# Patient Record
Sex: Male | Born: 1955 | Race: White | Hispanic: No | Marital: Married | Smoking: Never smoker
Health system: Southern US, Community
[De-identification: ages and names within clinical notes are randomized; demographics above are authoritative.]

## PROBLEM LIST (undated history)

## (undated) DIAGNOSIS — C169 Malignant neoplasm of stomach, unspecified: Secondary | ICD-10-CM

## (undated) DIAGNOSIS — M1990 Unspecified osteoarthritis, unspecified site: Secondary | ICD-10-CM

## (undated) DIAGNOSIS — N289 Disorder of kidney and ureter, unspecified: Secondary | ICD-10-CM

## (undated) DIAGNOSIS — E785 Hyperlipidemia, unspecified: Secondary | ICD-10-CM

## (undated) DIAGNOSIS — E119 Type 2 diabetes mellitus without complications: Secondary | ICD-10-CM

## (undated) DIAGNOSIS — M869 Osteomyelitis, unspecified: Secondary | ICD-10-CM

## (undated) DIAGNOSIS — I10 Essential (primary) hypertension: Secondary | ICD-10-CM

## (undated) DIAGNOSIS — C029 Malignant neoplasm of tongue, unspecified: Secondary | ICD-10-CM

## (undated) DIAGNOSIS — E669 Obesity, unspecified: Secondary | ICD-10-CM

## (undated) DIAGNOSIS — B958 Unspecified staphylococcus as the cause of diseases classified elsewhere: Secondary | ICD-10-CM

## (undated) DIAGNOSIS — Z9889 Other specified postprocedural states: Secondary | ICD-10-CM

## (undated) HISTORY — DX: Essential (primary) hypertension: I10

## (undated) HISTORY — DX: Obesity, unspecified: E66.9

## (undated) HISTORY — DX: Hyperlipidemia, unspecified: E78.5

## (undated) HISTORY — PX: OTHER SURGICAL HISTORY: SHX169

## (undated) HISTORY — DX: Malignant neoplasm of stomach, unspecified: C16.9

## (undated) HISTORY — DX: Type 2 diabetes mellitus without complications: E11.9

---

## 2004-10-15 ENCOUNTER — Emergency Department: Payer: Self-pay | Admitting: Emergency Medicine

## 2004-10-15 IMAGING — CR DG SHOULDER 3+V*L*
1 series · 3 of 3 positions shown · non-contrast
Comparison: none

REASON FOR EXAM: LEFT shoulder pain. Rm 5
COMMENTS:

PROCEDURE:     DXR - DXR SHOULDER LEFT COMPLETE  - [DATE]  [DATE]
RESULT:     Three views of the LEFT shoulder show no fracture, dislocation,
or other acute bony abnormality.

[Series 1: view not recorded · 0.17mm/px · 3 of 3 slices shown]
[im 1/3]
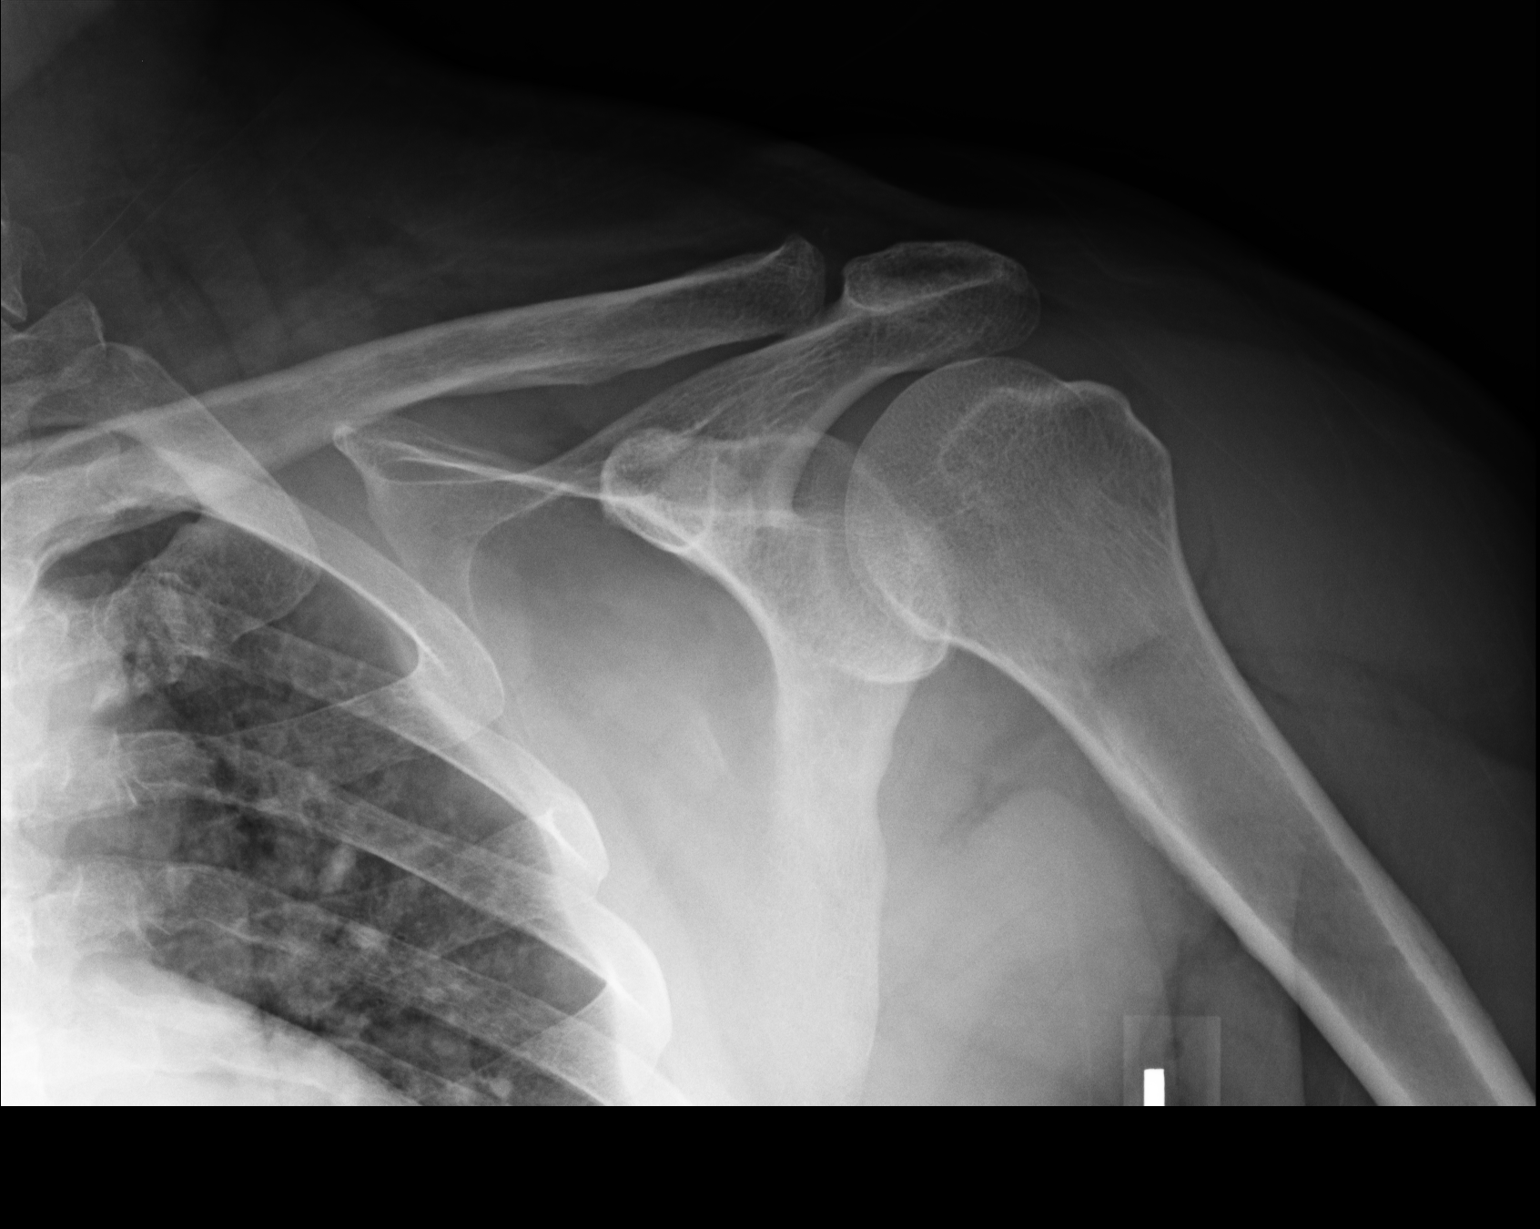
[im 2/3]
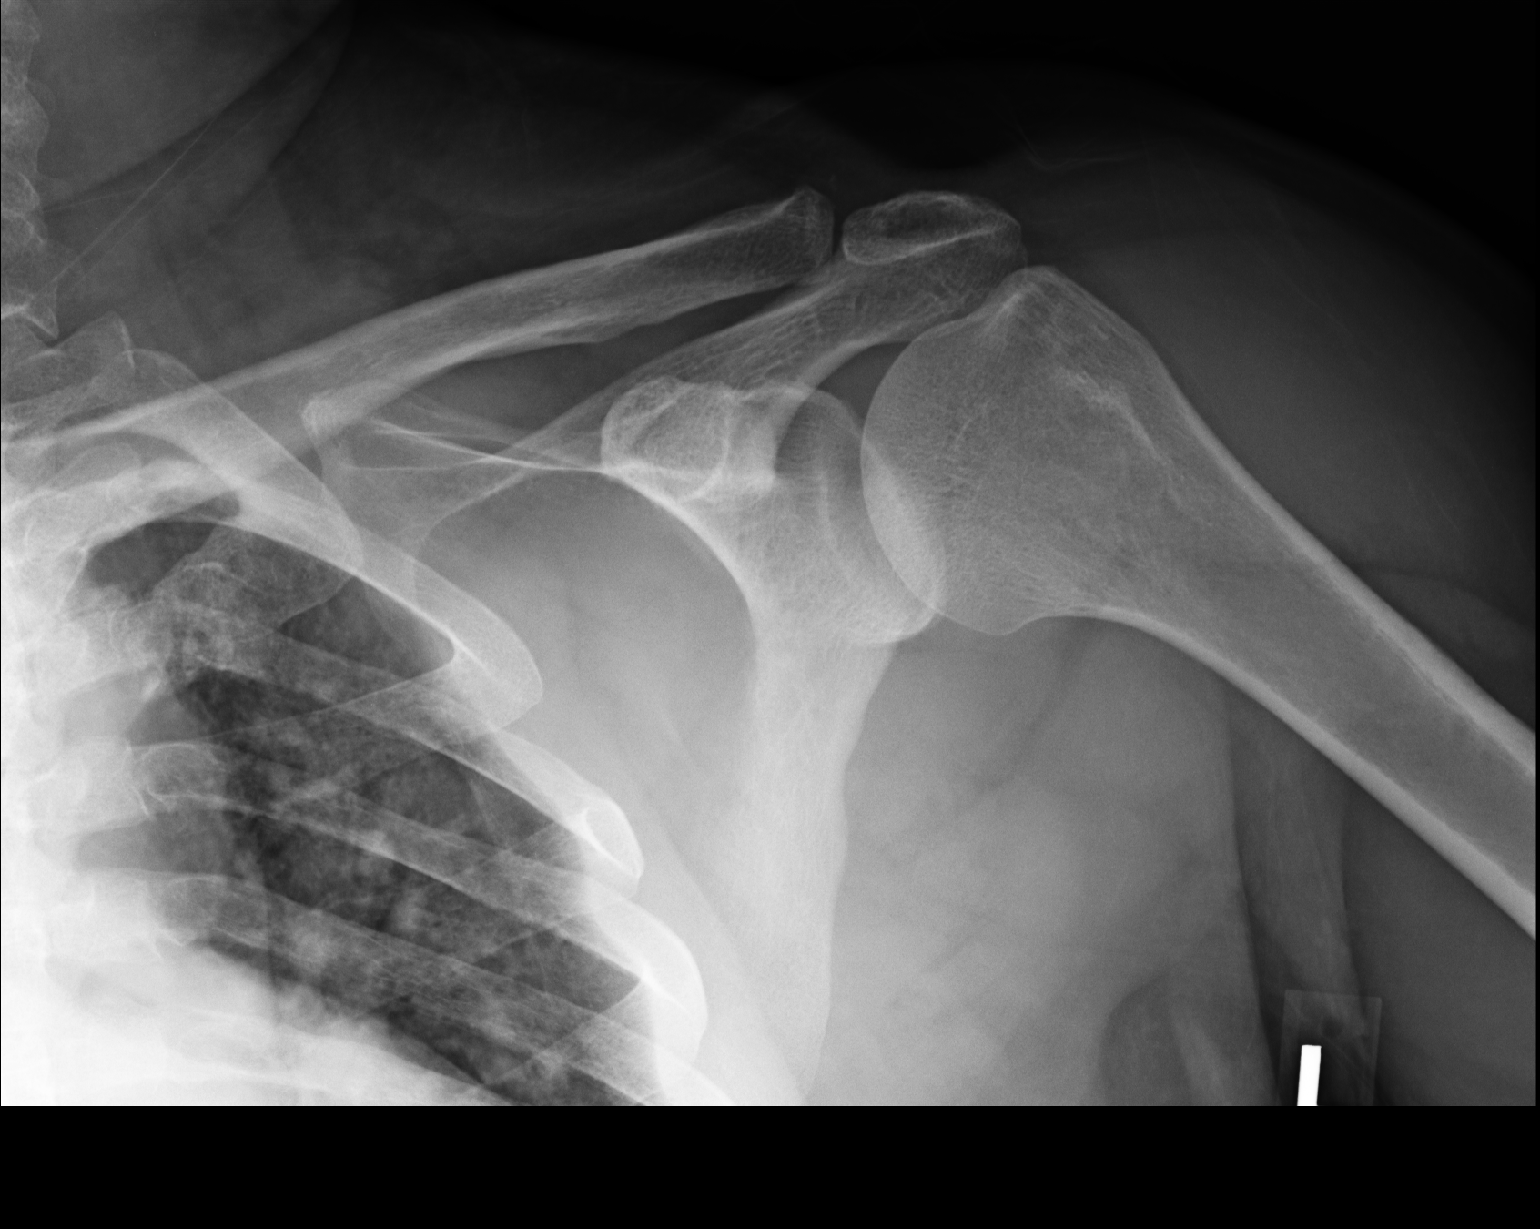
[im 3/3]
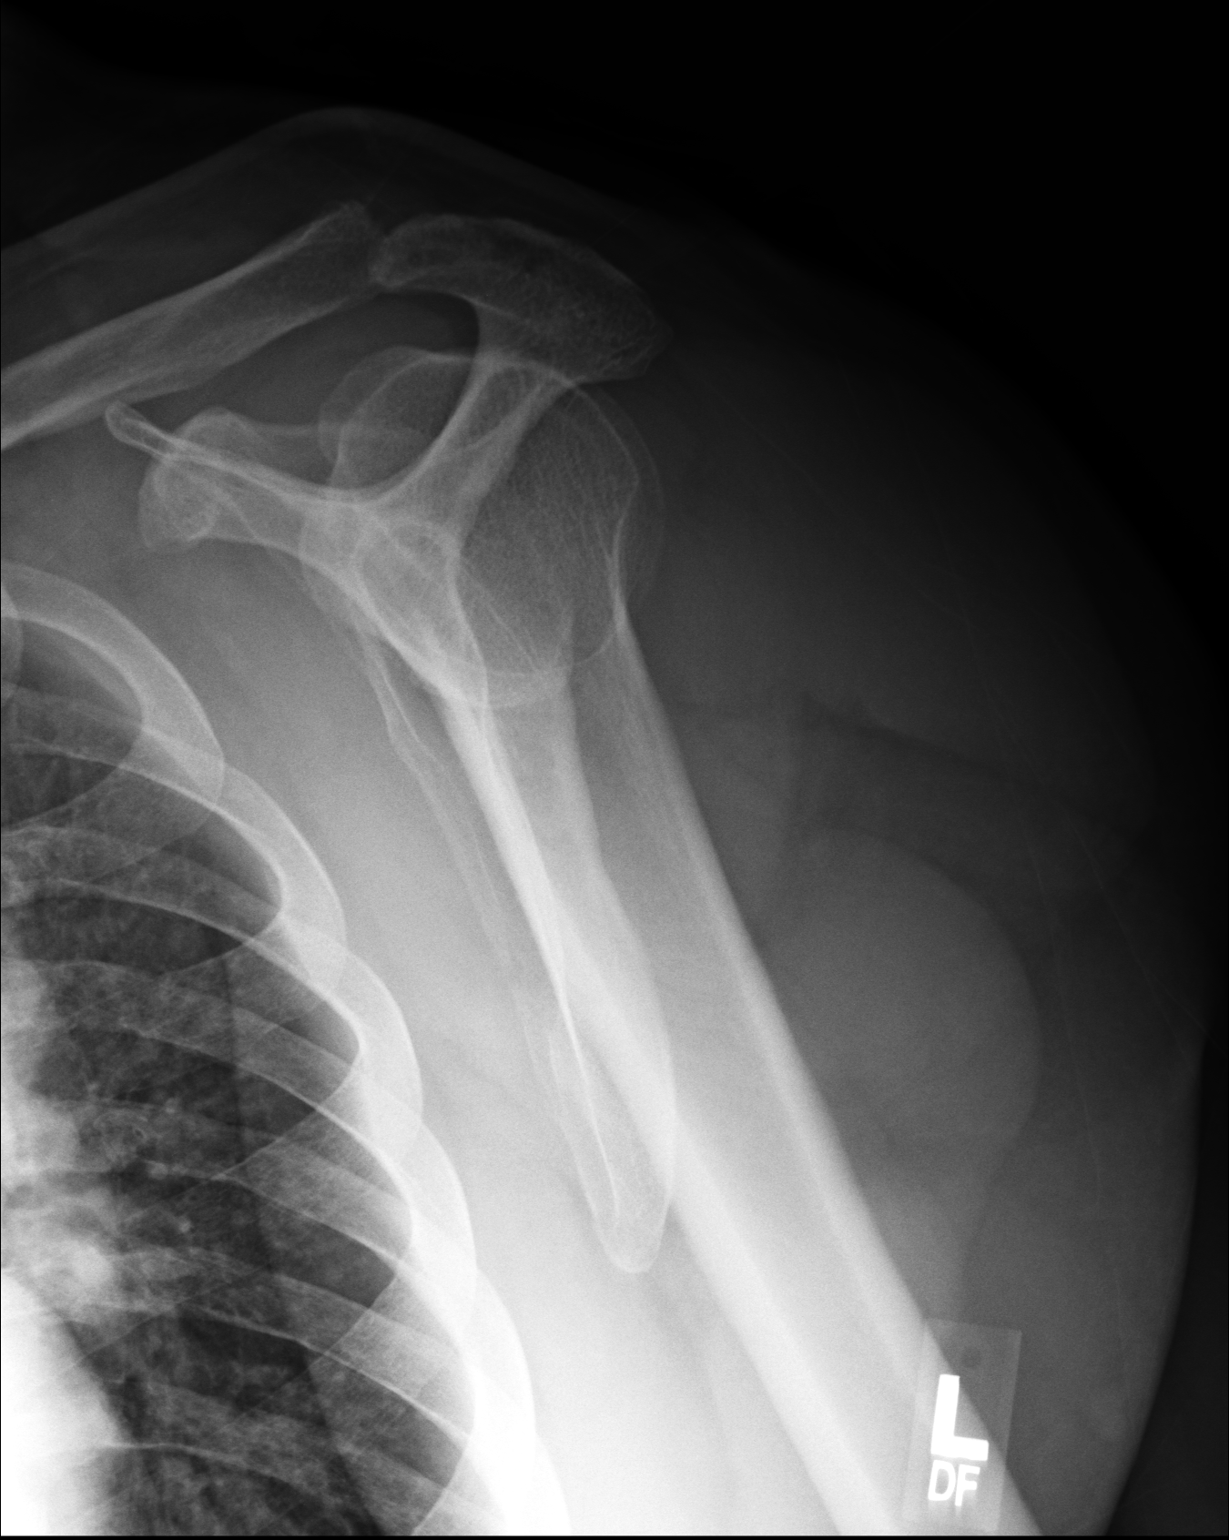

[3 of 3 positions shown; findings below may reference images not displayed]

IMPRESSION: No significant osseous abnormalities are noted.

## 2006-11-04 ENCOUNTER — Emergency Department: Payer: Self-pay | Admitting: Emergency Medicine

## 2006-11-04 IMAGING — US ABDOMEN ULTRASOUND
1 series · 17 of 25 positions shown · non-contrast
Comparison: none

REASON FOR EXAM: ruq pain pt in rm # 5
COMMENTS:

[Series 1: abdomen ultrasound · 17 of 61 slices shown]
[im 1/61]
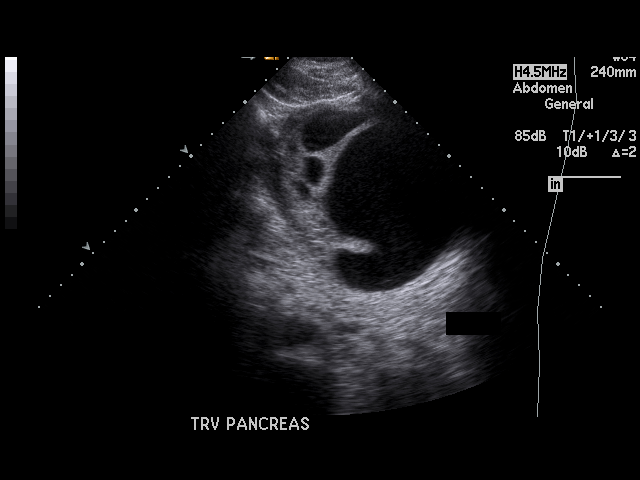
[im 6/61]
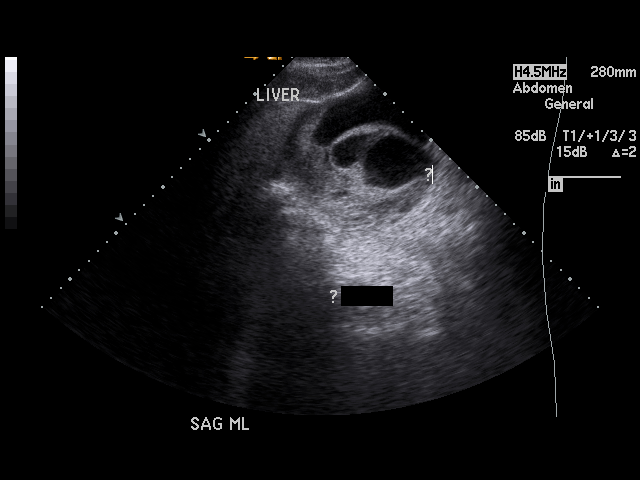
[im 8/61]
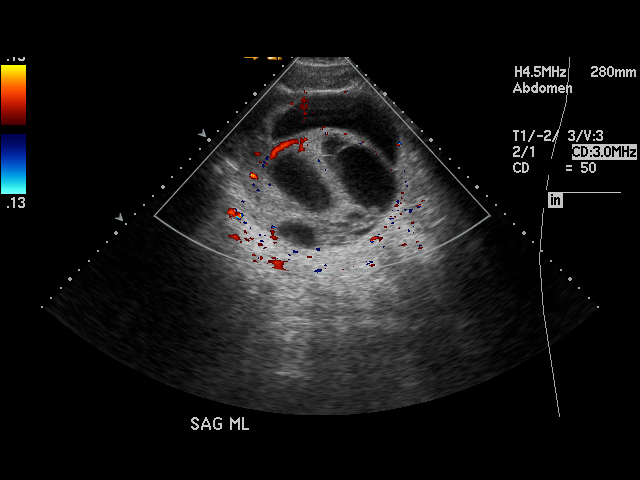
[im 13/61]
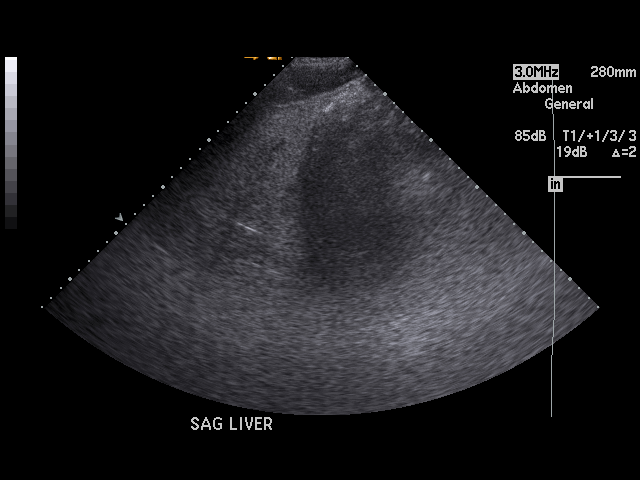
[im 16/61]
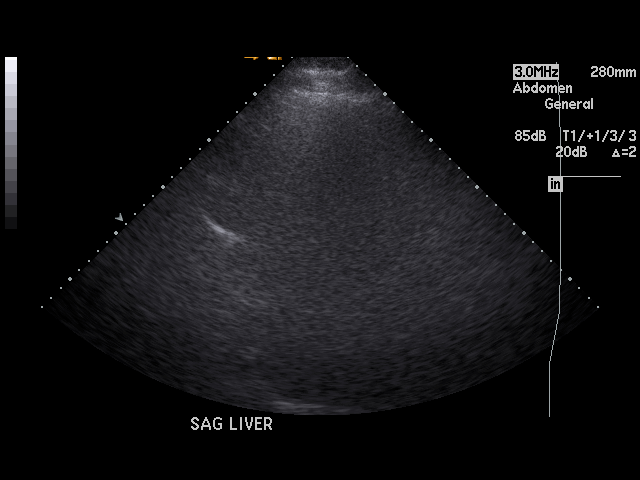
[im 21/61]
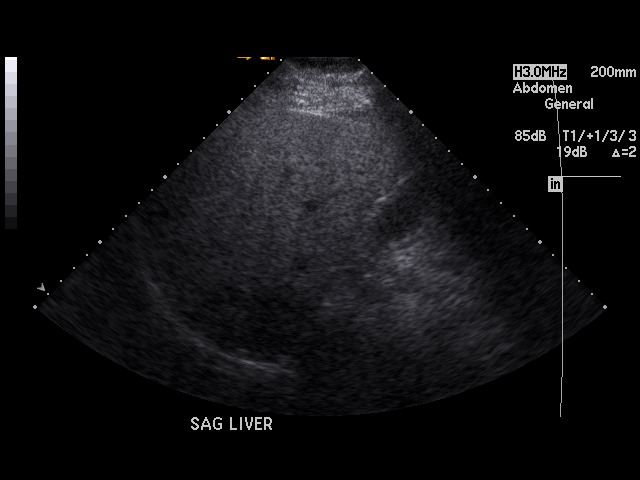
[im 23/61]
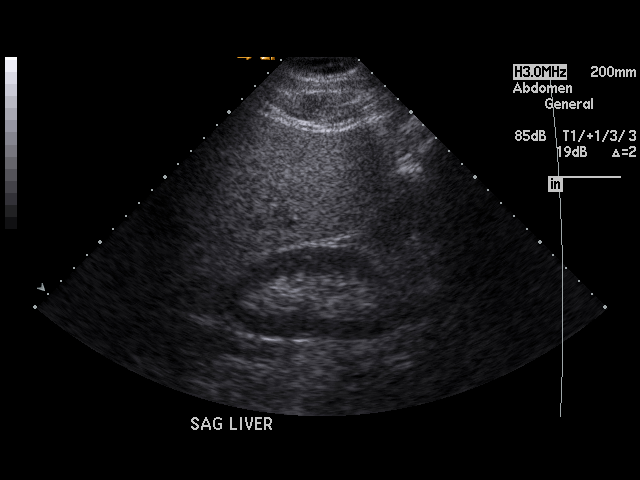
[im 28/61]
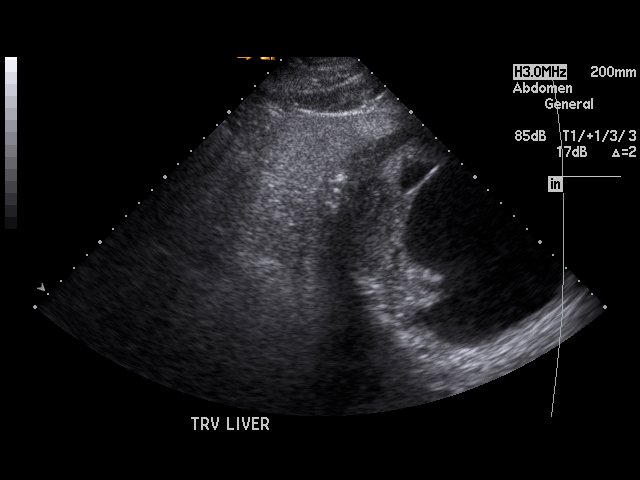
[im 31/61]
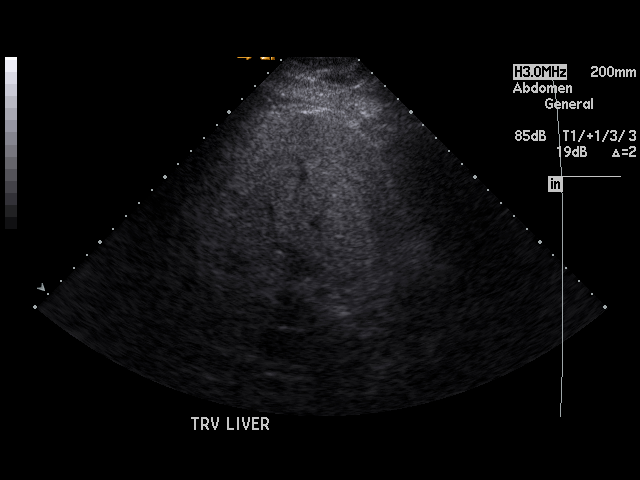
[im 33/61]
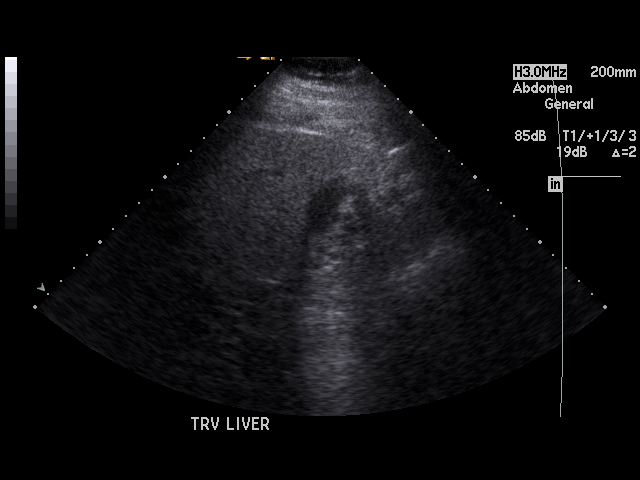
[im 38/61]
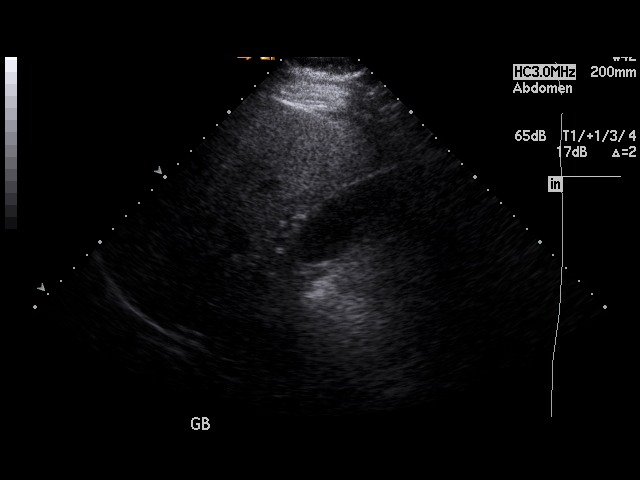
[im 41/61]
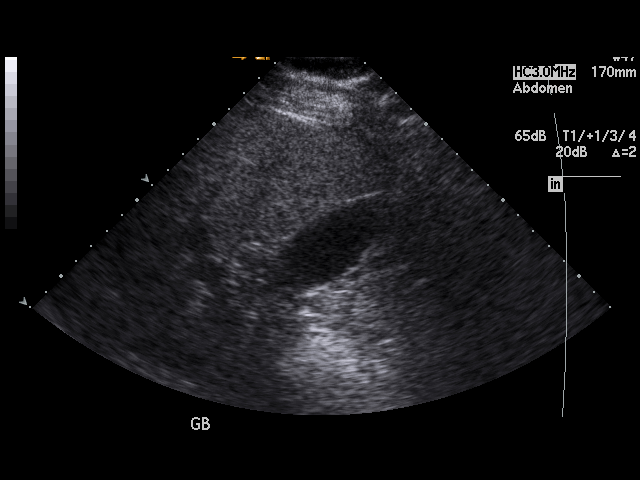
[im 46/61]
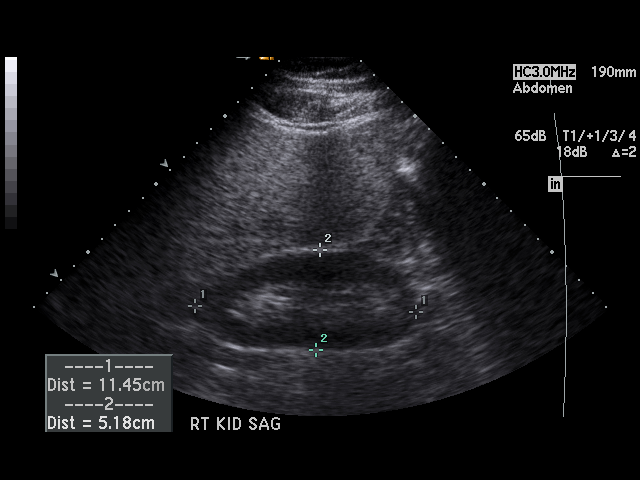
[im 48/61]
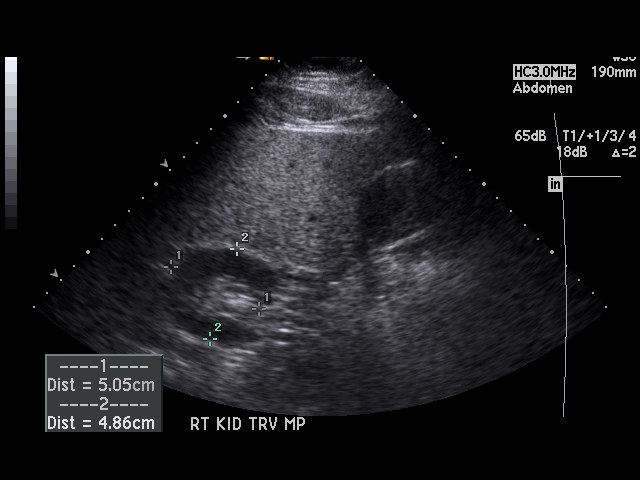
[im 53/61]
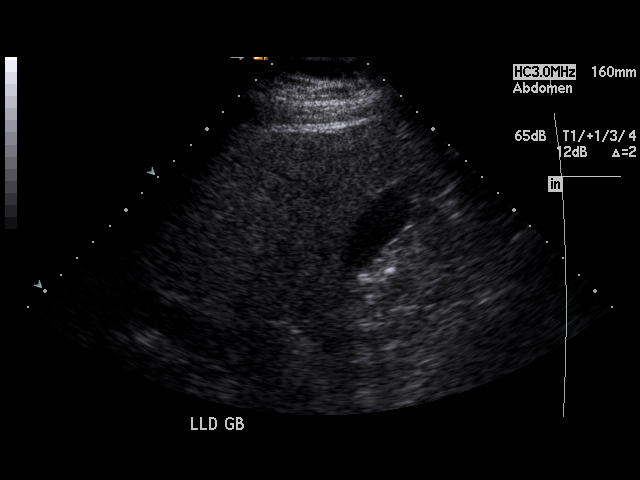
[im 56/61]
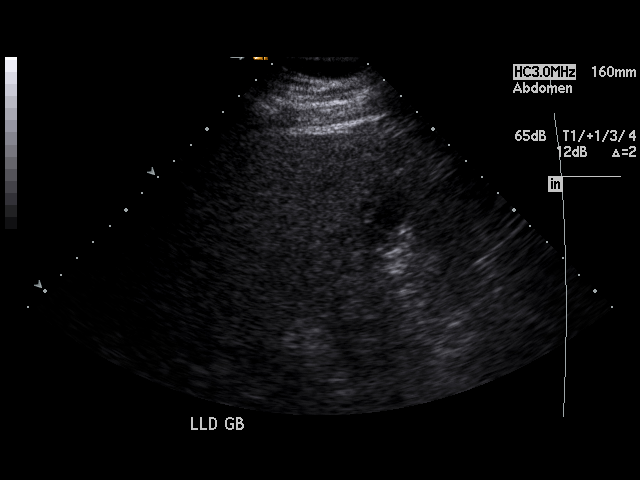
[im 61/61]
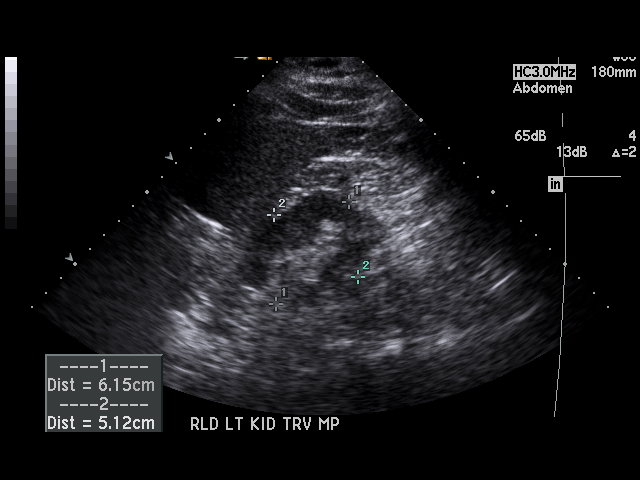

[17 of 25 positions shown; findings below may reference images not displayed]

PROCEDURE:     US  - US ABDOMEN GENERAL SURVEY  - [DATE]  [DATE]

RESULT:     The liver exhibits increased echotexture consistent with fatty
infiltration. Portal venous flow is normal in direction toward the liver.
There is a septated cystic appearing mass noted adjacent to the LEFT lobe of
the liver in the region of the pancreatic head but not clearly arising from
the poorly demonstrated pancreas. It measures 14.4 x 13.7 x 13.7 cm. This
will merit further evaluation with CT scanning. It likely reflects a
pancreatic pseudocyst given its complex septated appearance.

The gallbladder is adequately distended with no evidence of stones, wall
thickening, or pericholecystic fluid. The common bile duct measures 4.9 mm
in diameter. The spleen is normal in size and echotexture. The abdominal
aorta and kidneys are normal in appearance. There is no evidence of ascites.
IMPRESSION: 1. There is poor visualization of the pancreas. There is a complex,
cystic-appearing structure adjacent to or arising from the head of the
pancreas or adjacent LEFT lobe of the liver. Further evaluation with CT
scanning is recommended.
2. There is fatty infiltration of the liver.
3. The gallbladder is normal in appearance.
4. There is no evidence of ascites.

A preliminary report was sent to the [HOSPITAL] the conclusion
of the study.

## 2006-11-09 ENCOUNTER — Ambulatory Visit: Payer: Self-pay | Admitting: Family Medicine

## 2006-11-09 IMAGING — CT CT ABDOMEN W/ CM
1 of 3 series · 12 of 32 positions shown, 18 images · non-contrast
Comparison: none

REASON FOR EXAM: US Done Shows Possible Pancreatic Mass
COMMENTS:

[Series 2: soft tissue · axial · 0.89mm/px · z∈[-924,-644]mm · 12 of 84 slices shown, 18 images]
[im 7/84  soft-tissue]
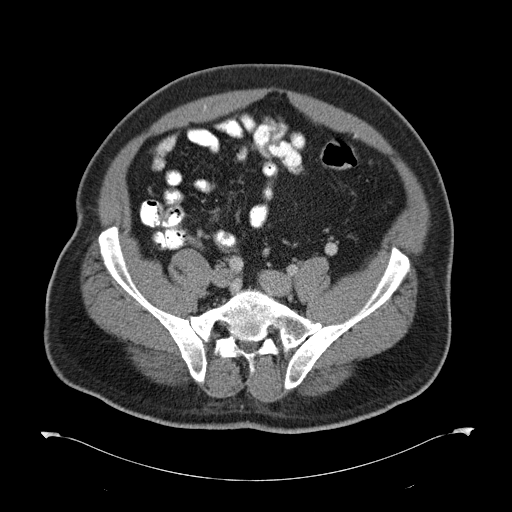
[im 7/84  bone]
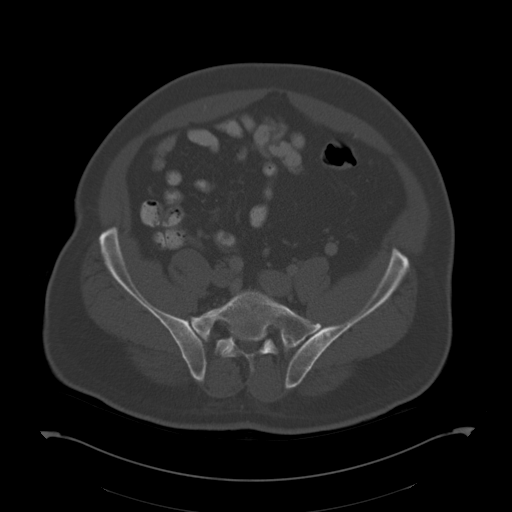
[im 13/84  soft-tissue]
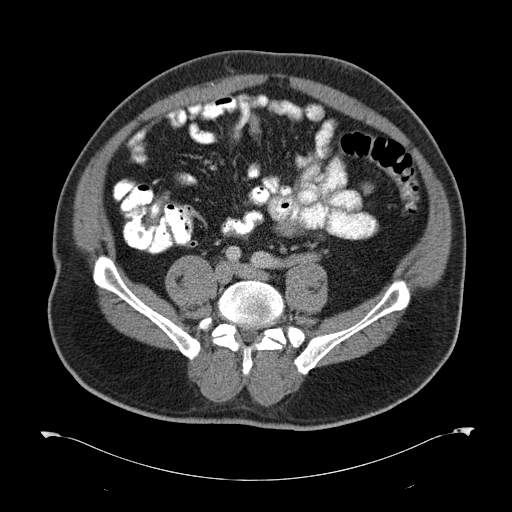
[im 20/84  soft-tissue]
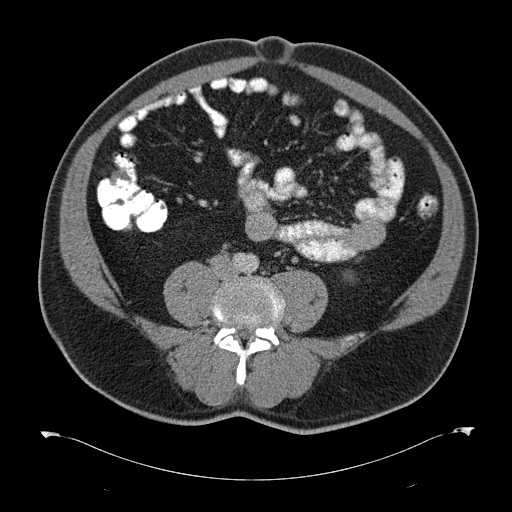
[im 26/84  soft-tissue]
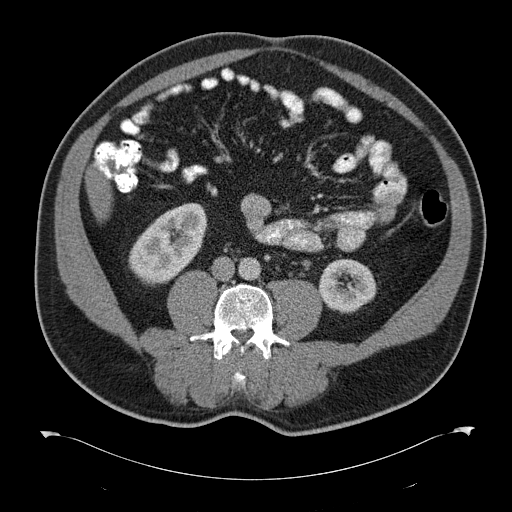
[im 32/84  soft-tissue]
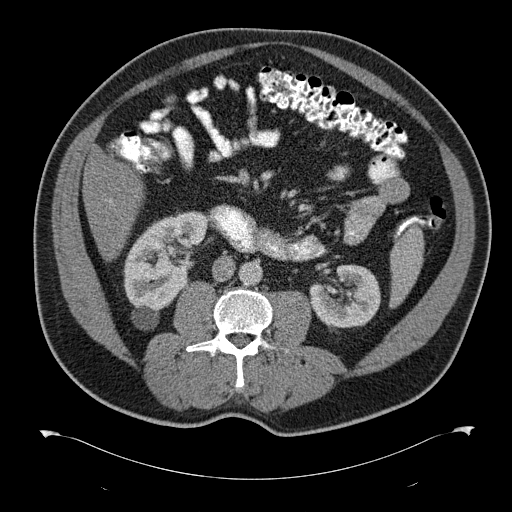
[im 39/84  soft-tissue]
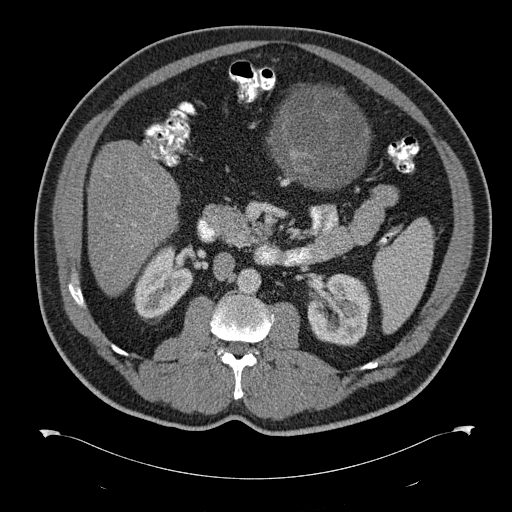
[im 45/84  soft-tissue]
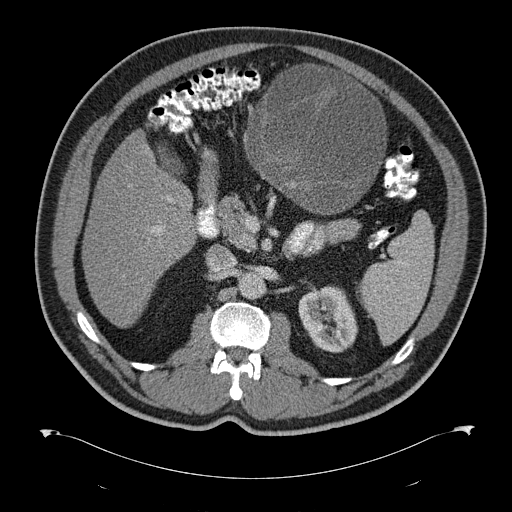
[im 52/84  soft-tissue]
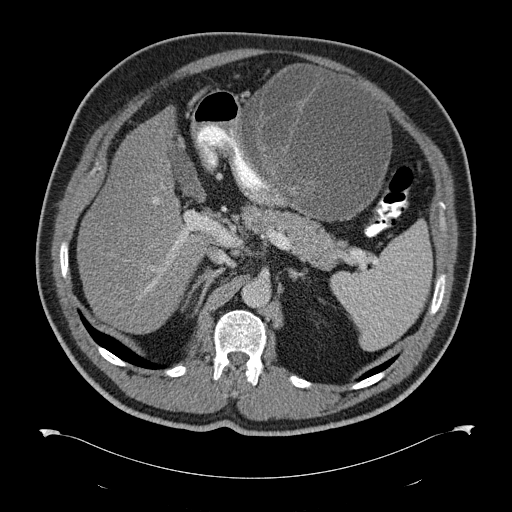
[im 58/84  soft-tissue]
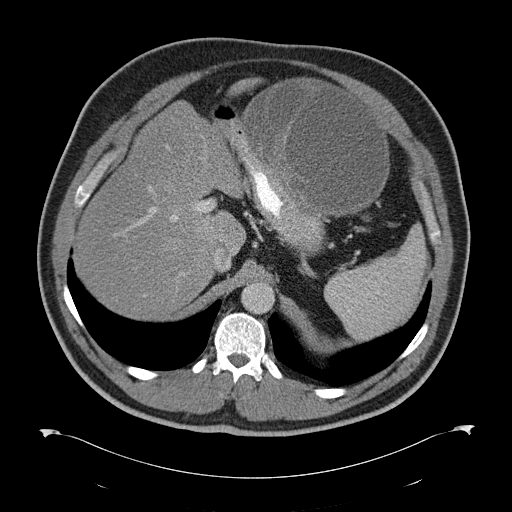
[im 58/84  lung]
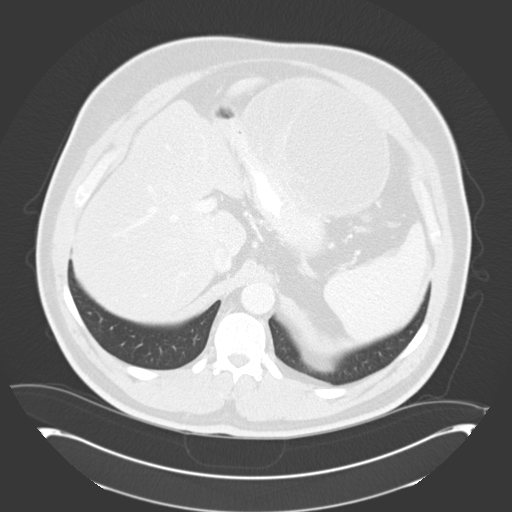
[im 58/84  bone]
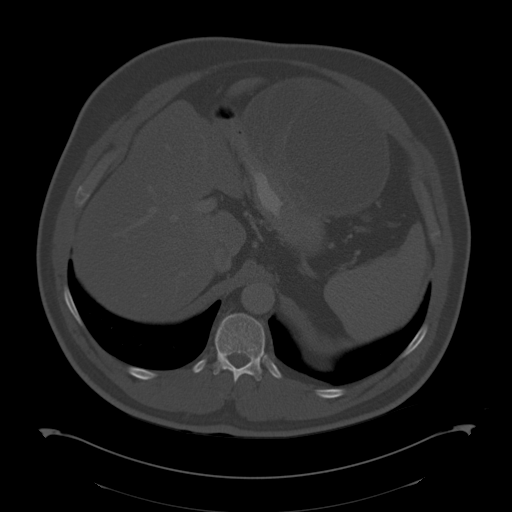
[im 64/84  soft-tissue]
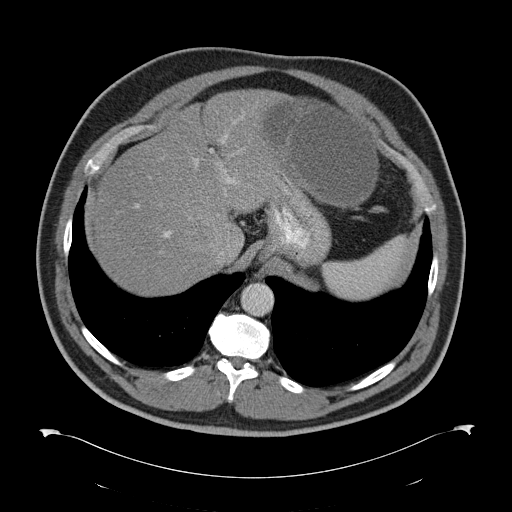
[im 64/84  lung]
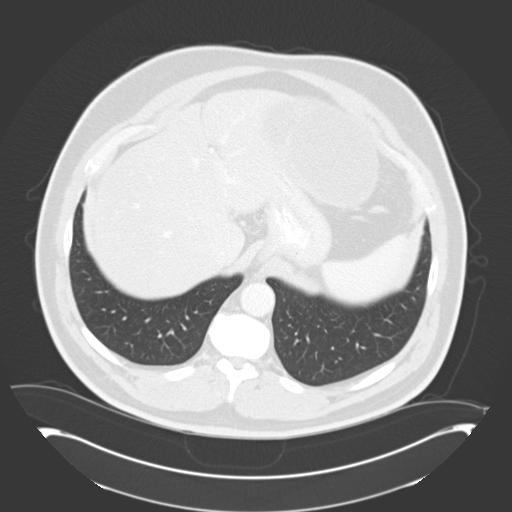
[im 71/84  soft-tissue]
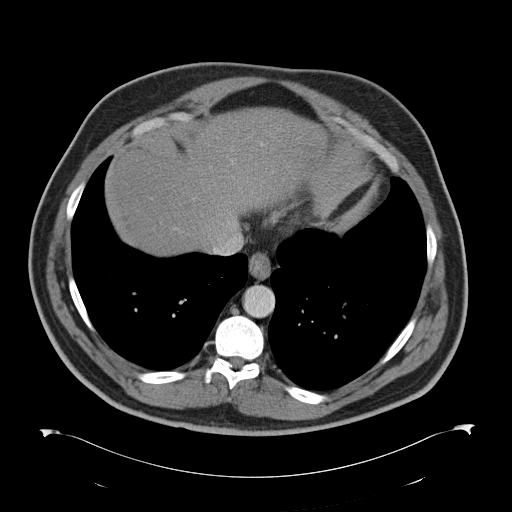
[im 71/84  lung]
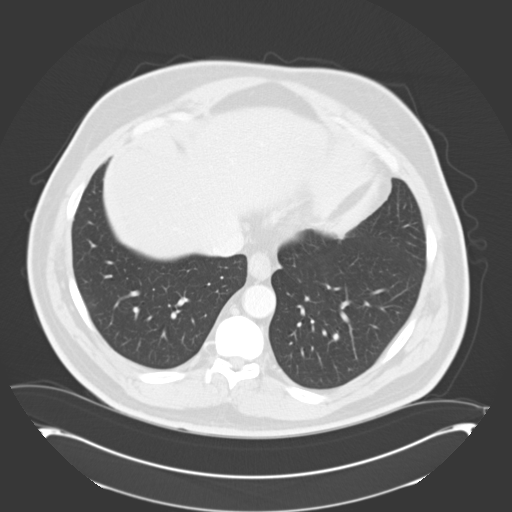
[im 77/84  soft-tissue]
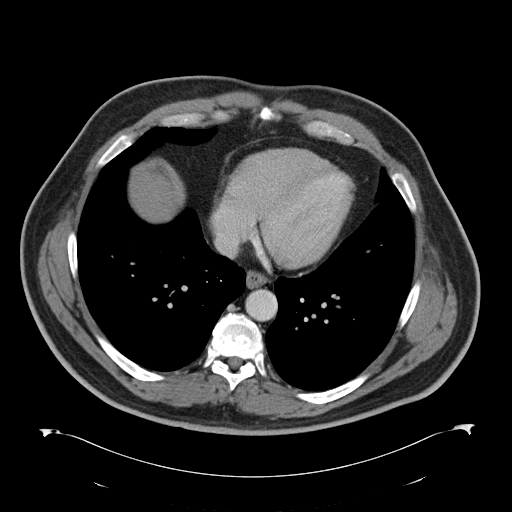
[im 77/84  lung]
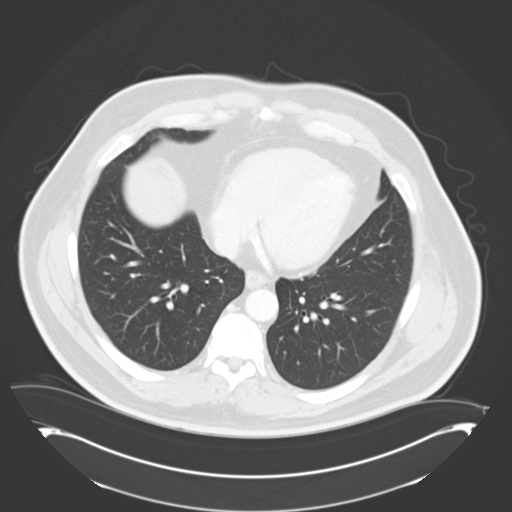

[12 of 32 positions shown; findings below may reference images not displayed]

PROCEDURE:     CT  - CT ABDOMEN COMPLEX W  - [DATE]  [DATE]

RESULT:     CT of the abdomen is performed utilizing 80 ml of [NA] and
oral contrast. Correlation is made with the ultrasound performed on
[DATE]. There is a multiloculated or septated predominantly cystic
structure present as demonstrated on the previously obtained ultrasound.
This measures as much as 13.9 cm anterior to posterior and 13.8 cm medial to
collateral on image #36. This does not arise from the pancreas. This abuts
the stomach displacing it toward the midline and appears to possibly arise
from the lateral aspect of the LEFT lobe of liver. The remaining portion of
the liver shows slightly decreased attenuation suggestive of fatty
infiltration. The spleen appears to be normal. The pancreas is unremarkable.
There is a low attenuation area posteriorly in the midportion of the RIGHT
kidney measuring as much as 2.35 cm and consistent with a RIGHT renal cyst.
The kidneys demonstrate no obstruction. The aorta is normal in caliber.
There is no adenopathy evident. Included portion of the bowel is
unremarkable. The lung bases included on the examination are normally
aerated.
IMPRESSION: Multiloculated or septated cystic mass abuts the LEFT lobe of the liver.
This could represent a mesenteric cyst or possibly a GI duplication cyst. A
pseudocyst is felt to be less likely given that there appears to be a tissue
plane between the cystic structure and the pancreas. Further evaluation
either with percutaneous biopsy with ultrasound guidance or transgastric
biopsy under endoscopic ultrasound would be considerations.

## 2007-02-12 ENCOUNTER — Emergency Department: Payer: Self-pay | Admitting: Emergency Medicine

## 2008-03-03 HISTORY — PX: OTHER SURGICAL HISTORY: SHX169

## 2008-11-01 ENCOUNTER — Ambulatory Visit: Payer: Self-pay | Admitting: Oncology

## 2008-11-10 ENCOUNTER — Inpatient Hospital Stay: Payer: Self-pay | Admitting: Internal Medicine

## 2008-11-10 IMAGING — CT CT ABD-PELV W/ CM
1 of 3 series · 13 of 32 positions shown, 18 images · non-contrast
Comparison: none

REASON FOR EXAM: (1) mass.; (2) mass
COMMENTS:

[Series 2: abdomen · axial · 0.87mm/px · z∈[-469,-14]mm · 13 of 107 slices shown, 18 images]
[im 8/107  soft-tissue]
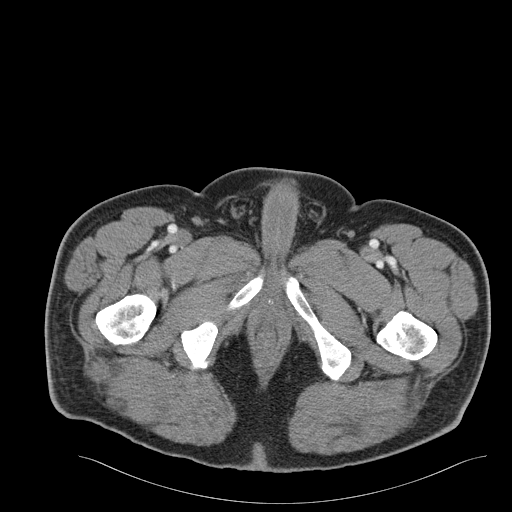
[im 8/107  bone]
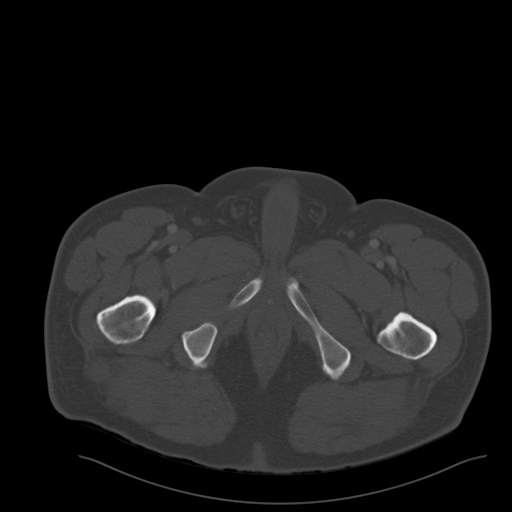
[im 15/107  soft-tissue]
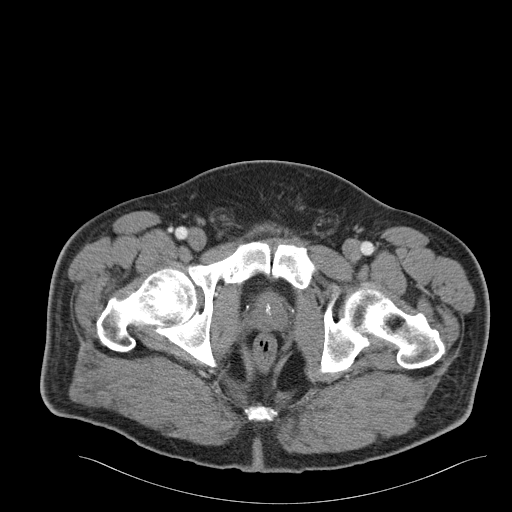
[im 22/107  soft-tissue]
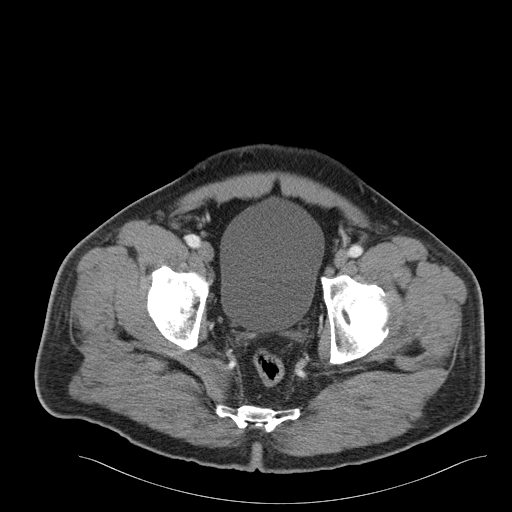
[im 36/107  soft-tissue]
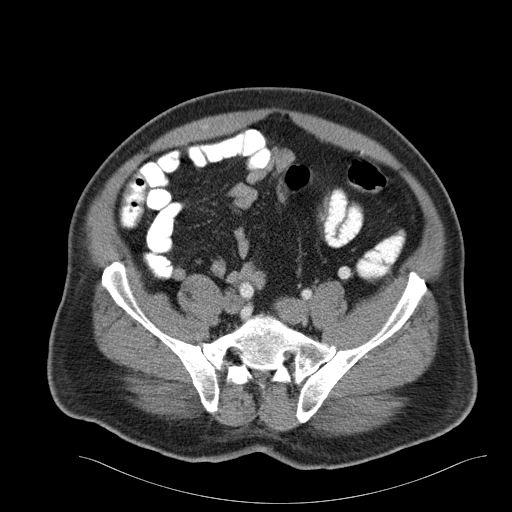
[im 43/107  soft-tissue]
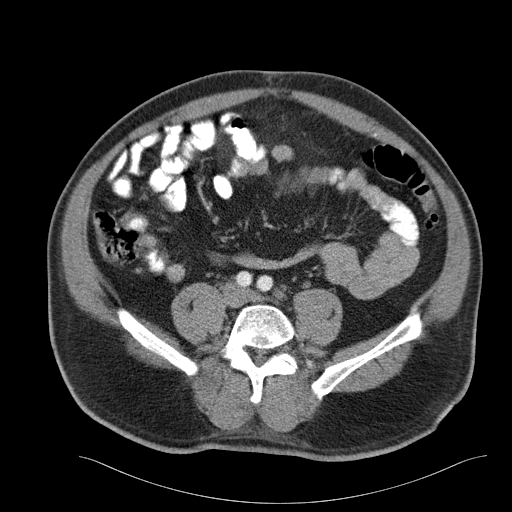
[im 50/107  soft-tissue]
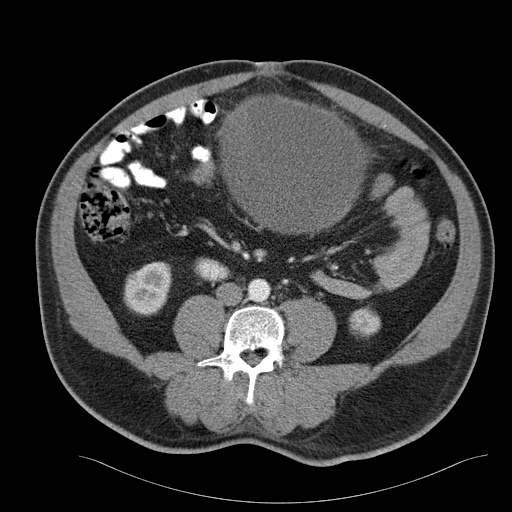
[im 57/107  soft-tissue]
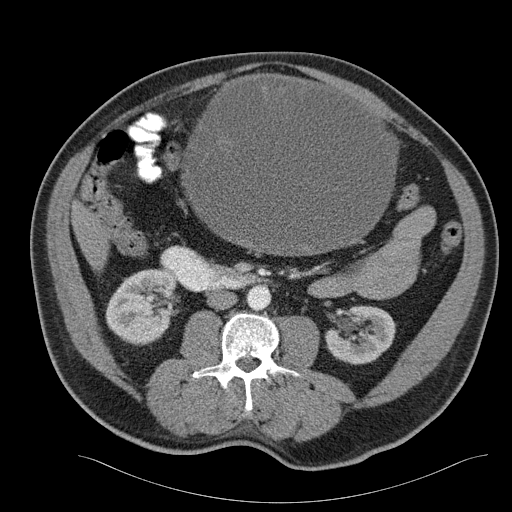
[im 64/107  soft-tissue]
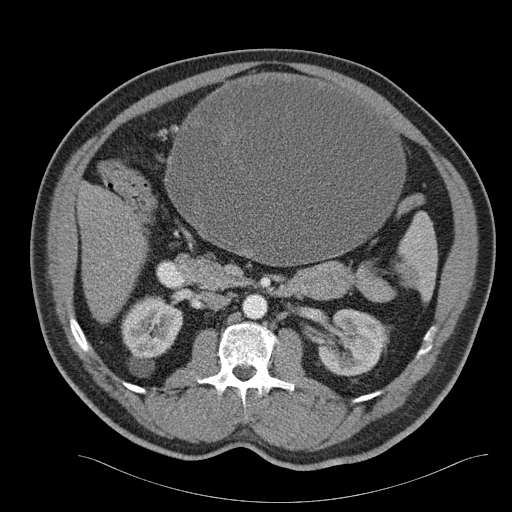
[im 71/107  soft-tissue]
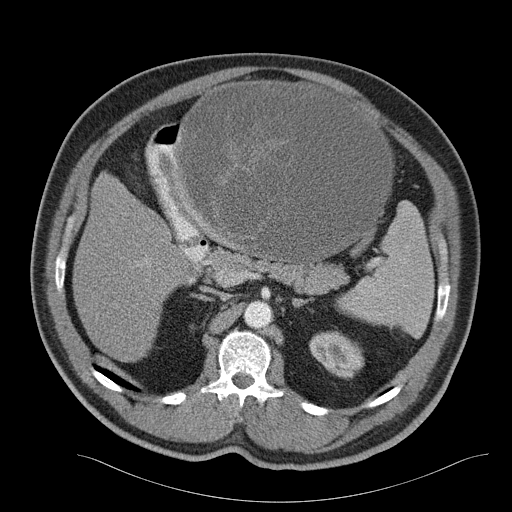
[im 71/107  bone]
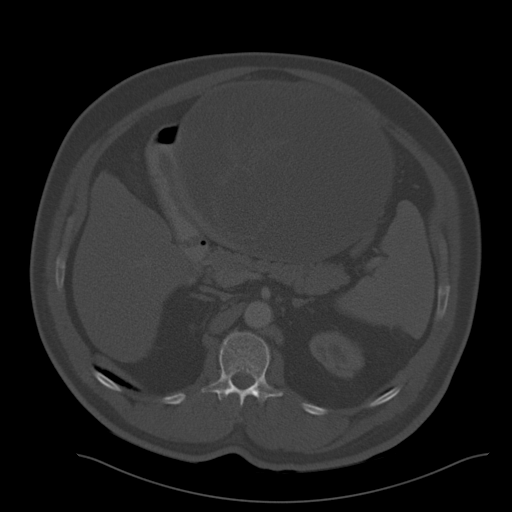
[im 78/107  lung]
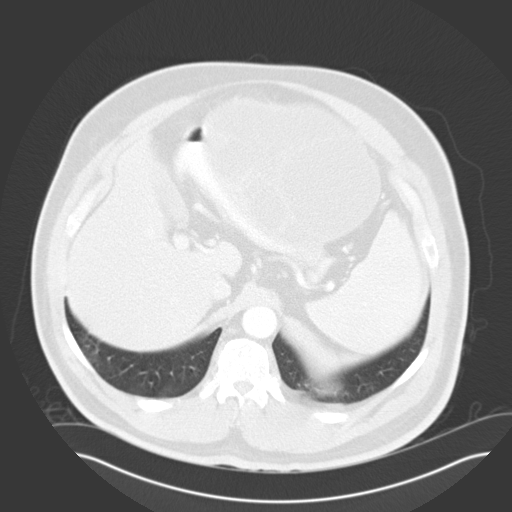
[im 85/107  soft-tissue]
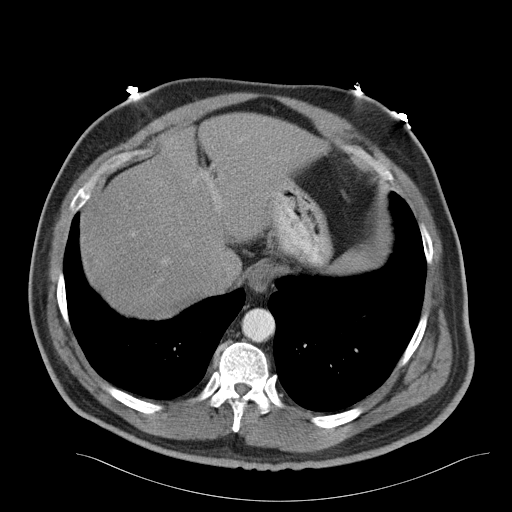
[im 85/107  lung]
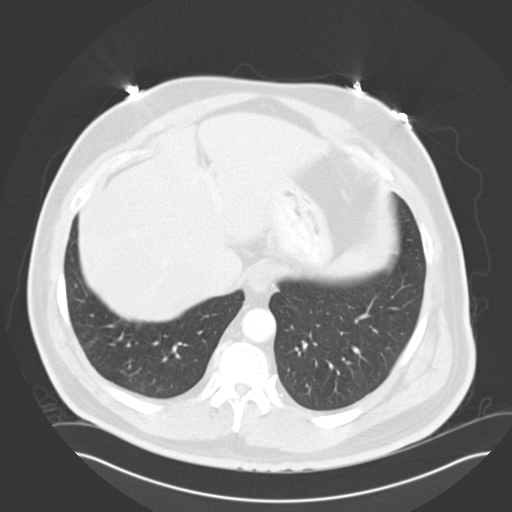
[im 92/107  soft-tissue]
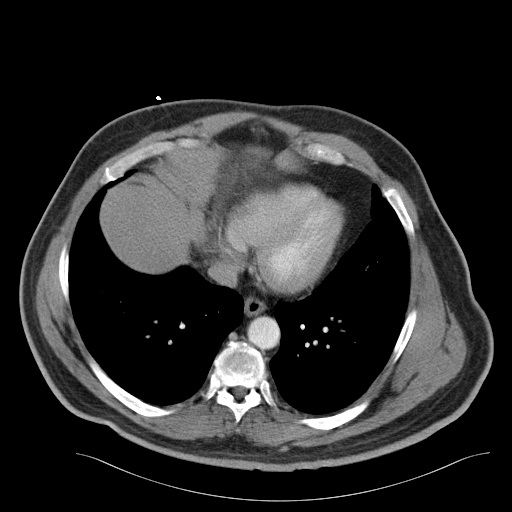
[im 92/107  lung]
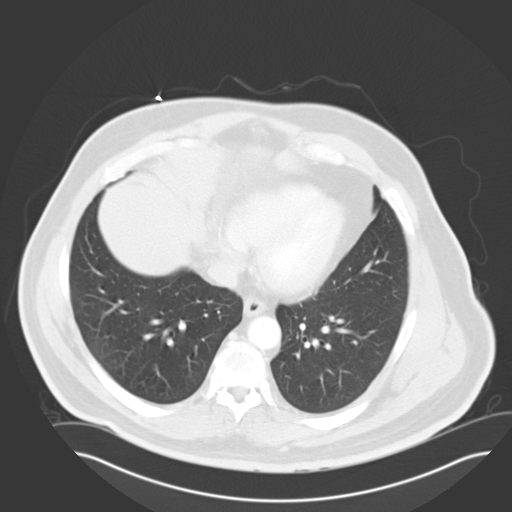
[im 99/107  soft-tissue]
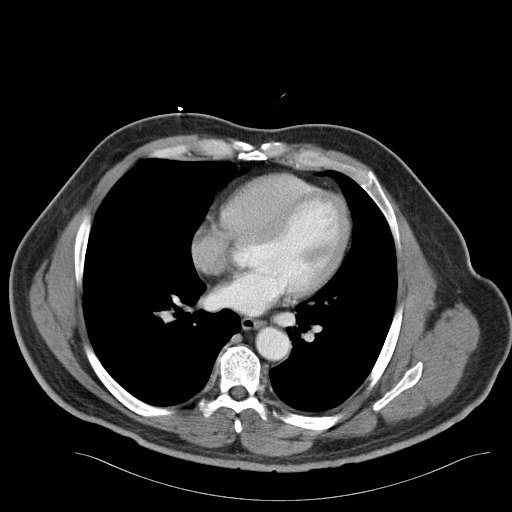
[im 99/107  lung]
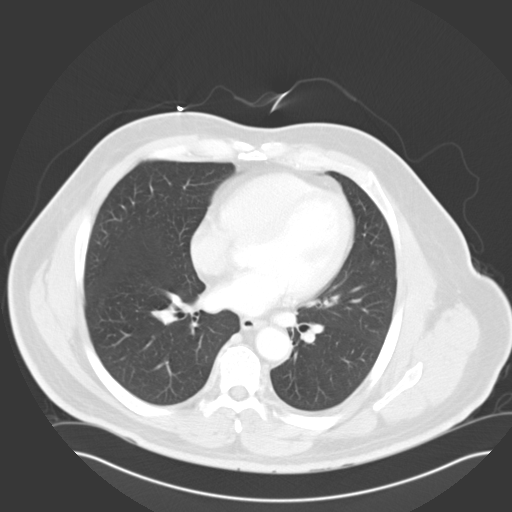

[13 of 32 positions shown; findings below may reference images not displayed]

PROCEDURE:     CT  - CT ABDOMEN / PELVIS  W  - [DATE]  [DATE]

RESULT:     Axial CT scanning was performed through the abdomen and pelvis
at 5 mm intervals and slice thicknesses following intravenous administration
of 100 cc of [RH]. Comparison is made to the study [DATE]. The patient also received oral contrast material. Review of
3-dimensional reconstructed images was performed separately on the [employer]
Server monitor.

 There is a large epigastric mass which is displacing the stomach
posteriorly and to the right. This septated hypodense subtly enhancing mass
has increased in size since the prior study. It measures approximately 20 cm
transversely by 17 cm AP by 19 cm in superior to inferior dimension. On
delayed images there remains enhancement of portions of this large septated
mass. No lymphadenopathy associated with the epigastric mass is identified.

 The spleen is displaced somewhat to the left and posteriorly. The
transverse colon is displaced inferiorly. The orally administered contrast
has traversed the stomach and entered the small bowel but has not yet
reached the colon. There is no evidence of bowel obstruction.

The liver exhibits no focal mass or ductal dilation. The gallbladder
exhibits no calcified stones. The pancreas, adrenal glands, and kidneys
exhibit no acute abnormality. There is a stable exophytic hypodensity
arising from the midpole of the right kidney most compatible with a cyst
measuring approximately 2.5 cm in diameter. It has Hounsfield measurements
of 4. The caliber of the abdominal aorta is normal. I see no periaortic or
pericaval lymphadenopathy.

Within the pelvis the urinary bladder and prostate gland and seminal
vesicles exhibit no acute abnormality. There is no evidence of ascites. The
relatively nondistended sigmoid colon is normal. I do not see abnormality of
the intrarenal collecting systems. The lung bases exhibit no abnormal
nodules. The lumbar vertebral bodies are preserved in height. The bony
pelvis is grossly intact.
IMPRESSION: 1. There has been additional interval growth of the multiseptated mass in
the epigastrium. There is marked mass effect upon the stomach. The mass
appears to be closely applied to the pancreas but separate from it. The
stomach is draped over the mass, and I suspect that the mass is arising from
the submucosa or muscle layers of the stomach. The possibility of the mass
reflecting a complex mesenteric cyst is raised but is felt to be less likely.

2. I do not see evidence of bowel obstruction. No intra-abdominal or pelvic
inflammatory change is seen.

3. There is no evidence of intra-abdominal or pelvic lymphadenopathy.

4. There is a small ventral hernia containing only fat which appears stable
in the periumbilical region.

5. There is a stable presumed cyst exophytic from the posterior aspect of
the midpole of the right kidney.

## 2008-11-17 ENCOUNTER — Ambulatory Visit: Payer: Self-pay | Admitting: Oncology

## 2008-11-27 ENCOUNTER — Ambulatory Visit: Payer: Self-pay | Admitting: Oncology

## 2008-11-27 IMAGING — CT NM PET TUM IMG LTD AREA
5 series · 25 of 25 positions shown · non-contrast
Comparison: none

REASON FOR EXAM: abdominal mass in stomach and near right kidney
COMMENTS:

PROCEDURE:     PET - PET/CT DX STOMACH  - [DATE]  [DATE]
RESULT:
HISTORY: Abdominal mass.
COMPARISON STUDY: CT of the abdomen and pelvis of [DATE]. No PET positive
abnormalities are identified. Large epigastric mass lesion is PET negative.

[Series 3: ct wb 3.0 b30f · axial · 3.0mm · 0.98mm/px · z∈[-1443,-457]mm · 11 of 494 slices shown]
[im 1/494  soft-tissue]
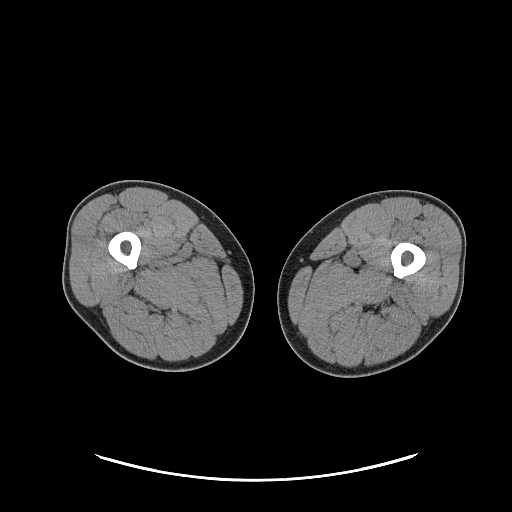
[im 50/494  soft-tissue]
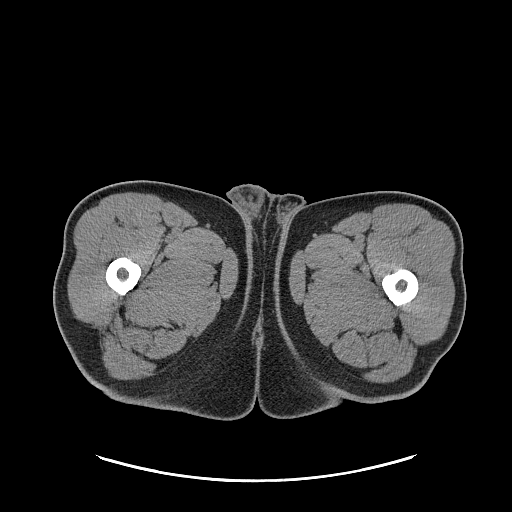
[im 99/494  soft-tissue]
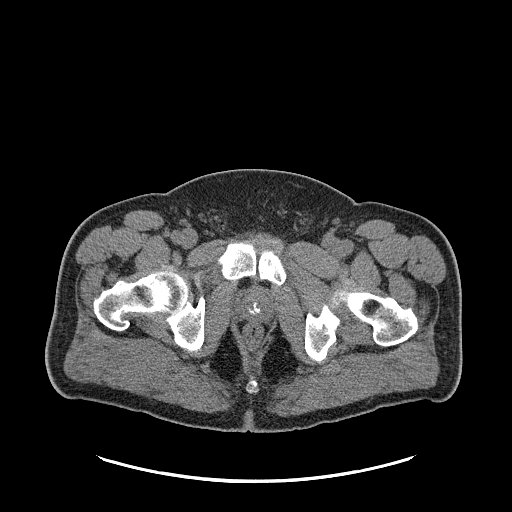
[im 148/494  soft-tissue]
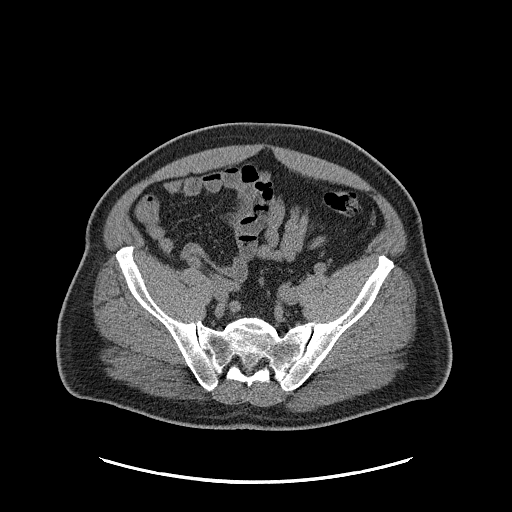
[im 198/494  soft-tissue]
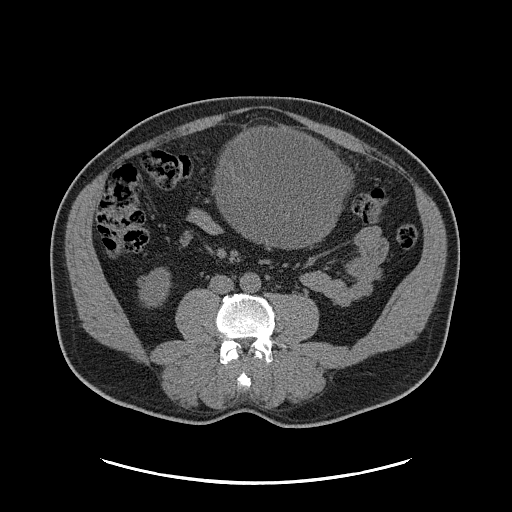
[im 247/494  soft-tissue]
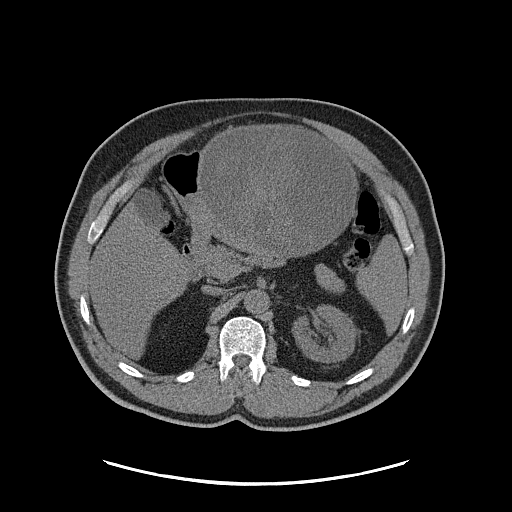
[im 296/494  soft-tissue]
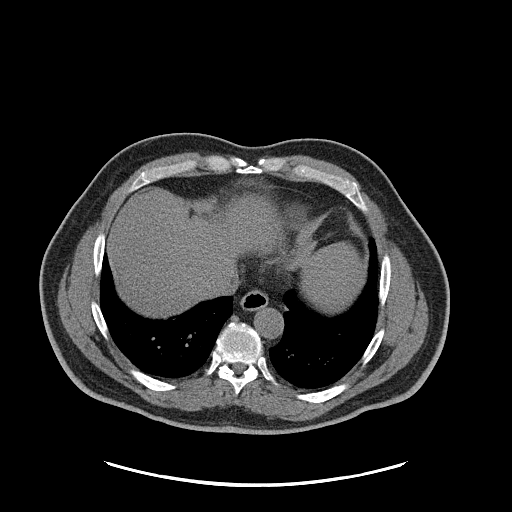
[im 346/494  soft-tissue]
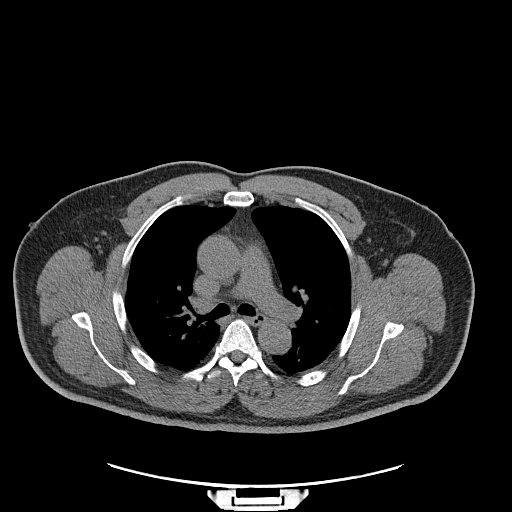
[im 395/494  soft-tissue]
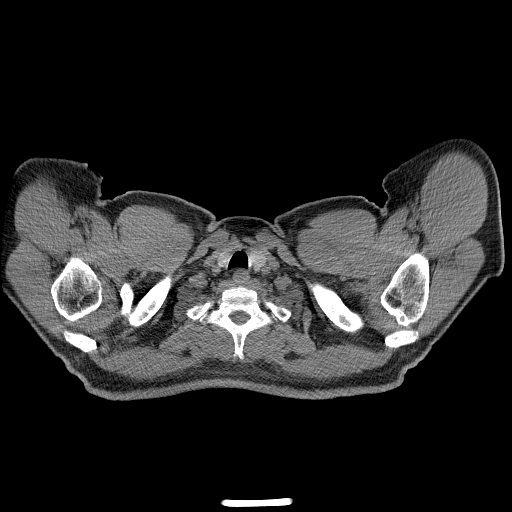
[im 444/494  soft-tissue]
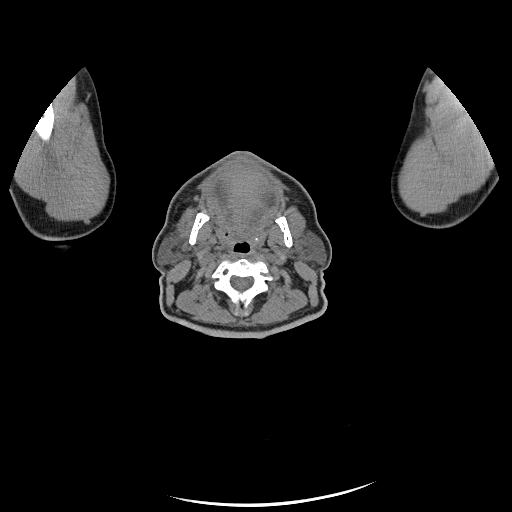
[im 494/494  soft-tissue]
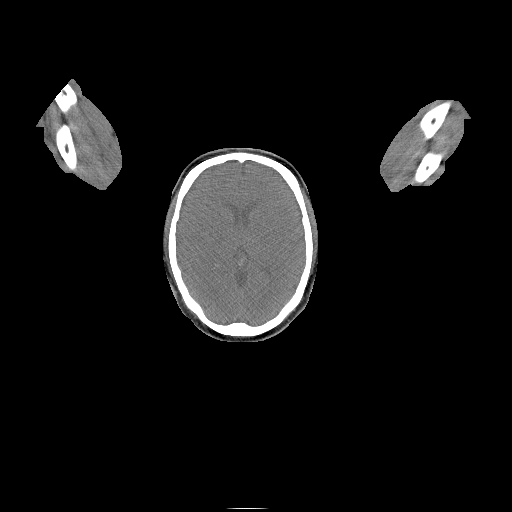

[Series 102: pet wb · axial · 5.0mm · 4.07mm/px · z∈[-1441,-457]mm · 7 of 329 slices shown]
[im 1/329]
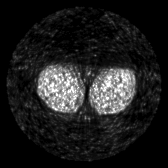
[im 55/329]
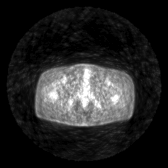
[im 110/329]
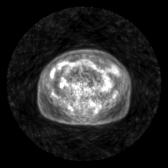
[im 165/329]
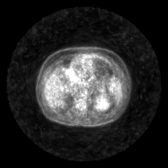
[im 219/329]
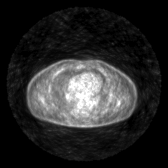
[im 274/329]
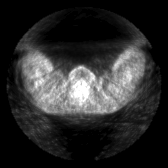
[im 329/329]
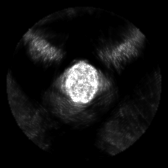

[Series 603: pet axial · 4 of 189 slices shown]
[im 1/189]
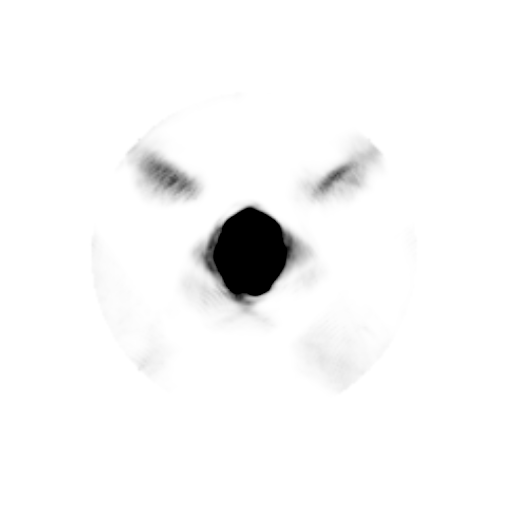
[im 63/189]
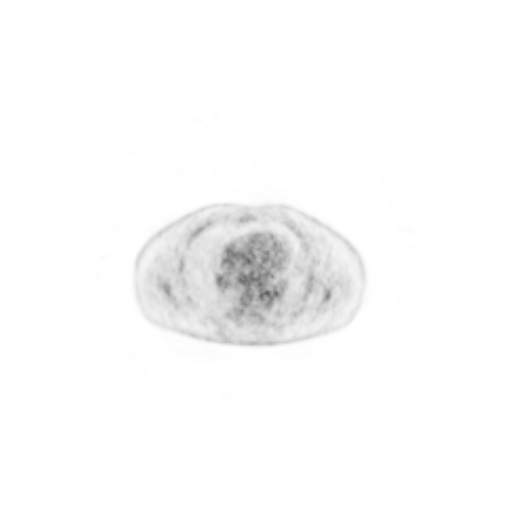
[im 126/189]
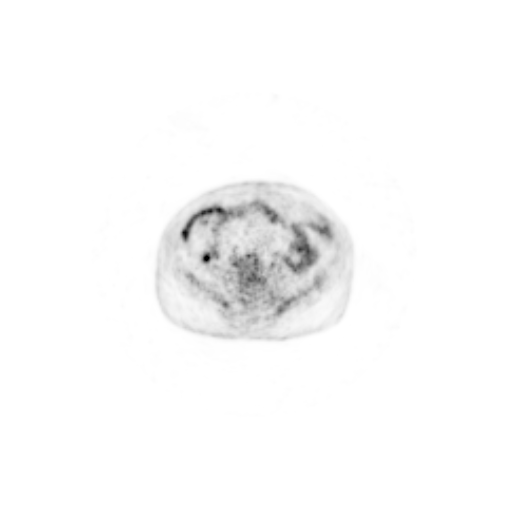
[im 189/189]
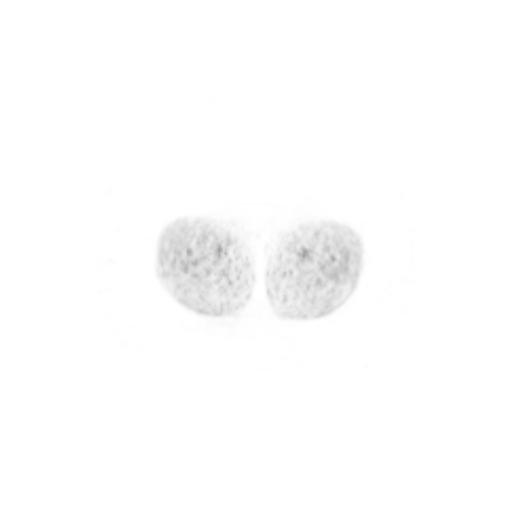

[Series 604: pet coronal · 1 of 67 slices shown]
[im 1/67]
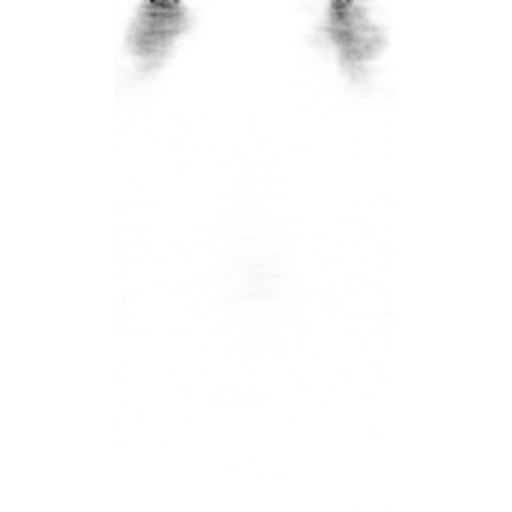

[Series 605: pet sagittal · 2 of 82 slices shown]
[im 1/82]
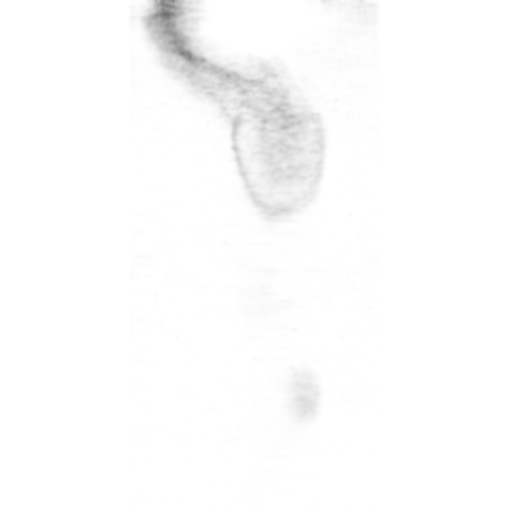
[im 82/82]
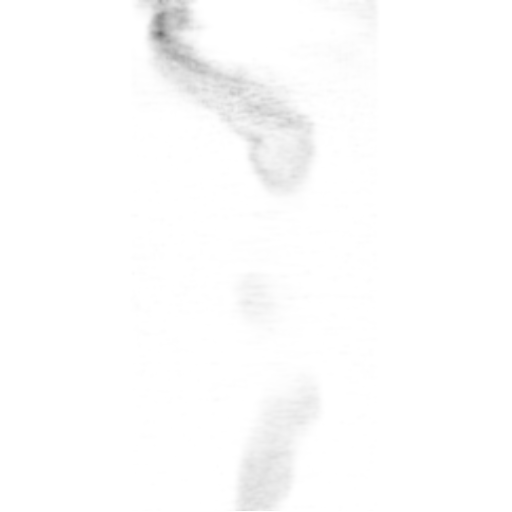

[25 of 25 positions shown; findings below may reference images not displayed]

IMPRESSION: Negative exam. This does not exclude a malignancy given the large
abnormality in the epigastric region on recent CT scan of [DATE].
Surgical evaluation should still be considered even in light of a negative
PET/CT.

## 2008-12-01 ENCOUNTER — Ambulatory Visit: Payer: Self-pay | Admitting: Oncology

## 2008-12-04 ENCOUNTER — Ambulatory Visit: Payer: Self-pay | Admitting: Oncology

## 2008-12-18 ENCOUNTER — Ambulatory Visit: Payer: Self-pay | Admitting: Surgery

## 2008-12-22 ENCOUNTER — Inpatient Hospital Stay: Payer: Self-pay | Admitting: Surgery

## 2009-01-01 ENCOUNTER — Ambulatory Visit: Payer: Self-pay | Admitting: Oncology

## 2009-01-18 ENCOUNTER — Ambulatory Visit: Payer: Self-pay | Admitting: Oncology

## 2009-01-31 ENCOUNTER — Ambulatory Visit: Payer: Self-pay | Admitting: Oncology

## 2009-04-03 ENCOUNTER — Ambulatory Visit: Payer: Self-pay | Admitting: Oncology

## 2009-04-13 ENCOUNTER — Ambulatory Visit: Payer: Self-pay | Admitting: Oncology

## 2009-05-01 ENCOUNTER — Ambulatory Visit: Payer: Self-pay | Admitting: Oncology

## 2012-08-08 ENCOUNTER — Emergency Department: Payer: Self-pay | Admitting: Emergency Medicine

## 2012-08-08 LAB — CBC
MCHC: 35.1 g/dL (ref 32.0–36.0)
MCV: 86 fL (ref 80–100)
RDW: 13.3 % (ref 11.5–14.5)
WBC: 10.7 10*3/uL — ABNORMAL HIGH (ref 3.8–10.6)

## 2012-08-08 LAB — COMPREHENSIVE METABOLIC PANEL
BUN: 17 mg/dL (ref 7–18)
Co2: 26 mmol/L (ref 21–32)
Creatinine: 1.17 mg/dL (ref 0.60–1.30)
EGFR (African American): >60
EGFR (Non-African Amer.): >60
Osmolality: 294 (ref 275–301)
Potassium: 4 mmol/L (ref 3.5–5.1)
SGOT(AST): 19 U/L (ref 15–37)
SGPT (ALT): 40 U/L (ref 12–78)
Total Protein: 7.4 g/dL (ref 6.4–8.2)

## 2012-08-08 LAB — URINALYSIS, COMPLETE
Bilirubin,UR: NEGATIVE
Blood: NEGATIVE
Glucose,UR: >=500 mg/dL (ref 0–75)
Leukocyte Esterase: NEGATIVE
Nitrite: NEGATIVE
RBC,UR: 1 /HPF (ref 0–5)
Specific Gravity: 1.031 (ref 1.003–1.030)
Squamous Epithelial: NONE SEEN
WBC UR: 1 /HPF (ref 0–5)

## 2012-08-08 LAB — CK TOTAL AND CKMB (NOT AT ARMC): CK, Total: 50 U/L (ref 35–232)

## 2012-08-08 LAB — TROPONIN I: Troponin-I: <0.02 ng/mL

## 2013-02-14 ENCOUNTER — Emergency Department: Payer: Self-pay | Admitting: Emergency Medicine

## 2013-02-14 LAB — URINALYSIS, COMPLETE
Bacteria: NONE SEEN
Blood: NEGATIVE
Glucose,UR: 300 mg/dL (ref 0–75)
Nitrite: NEGATIVE
Ph: 5 (ref 4.5–8.0)
Protein: 25
RBC,UR: 2 /HPF (ref 0–5)
Specific Gravity: 1.02 (ref 1.003–1.030)
Squamous Epithelial: 1

## 2013-02-14 LAB — CBC
HGB: 15.2 g/dL (ref 13.0–18.0)
MCH: 29.5 pg (ref 26.0–34.0)
MCHC: 34.8 g/dL (ref 32.0–36.0)
MCV: 85 fL (ref 80–100)
RDW: 13.3 % (ref 11.5–14.5)
WBC: 9.2 10*3/uL (ref 3.8–10.6)

## 2013-02-14 LAB — COMPREHENSIVE METABOLIC PANEL
Alkaline Phosphatase: 121 U/L — ABNORMAL HIGH
Anion Gap: 6 — ABNORMAL LOW (ref 7–16)
BUN: 21 mg/dL — ABNORMAL HIGH (ref 7–18)
Bilirubin,Total: 1 mg/dL (ref 0.2–1.0)
Calcium, Total: 8.8 mg/dL (ref 8.5–10.1)
Creatinine: 1.63 mg/dL — ABNORMAL HIGH (ref 0.60–1.30)
EGFR (African American): 53 — ABNORMAL LOW
Potassium: 3.8 mmol/L (ref 3.5–5.1)
Sodium: 127 mmol/L — ABNORMAL LOW (ref 136–145)
Total Protein: 7.1 g/dL (ref 6.4–8.2)

## 2013-02-14 IMAGING — CR DG CHEST 1V PORT
1 series · 2 of 2 positions shown · non-contrast
Comparison: None.

CLINICAL DATA: Chest pain

EXAM:
PORTABLE CHEST - 1 VIEW

[Series 1: ap · 0.17mm/px · 2 of 2 slices shown]
[im 1/2]
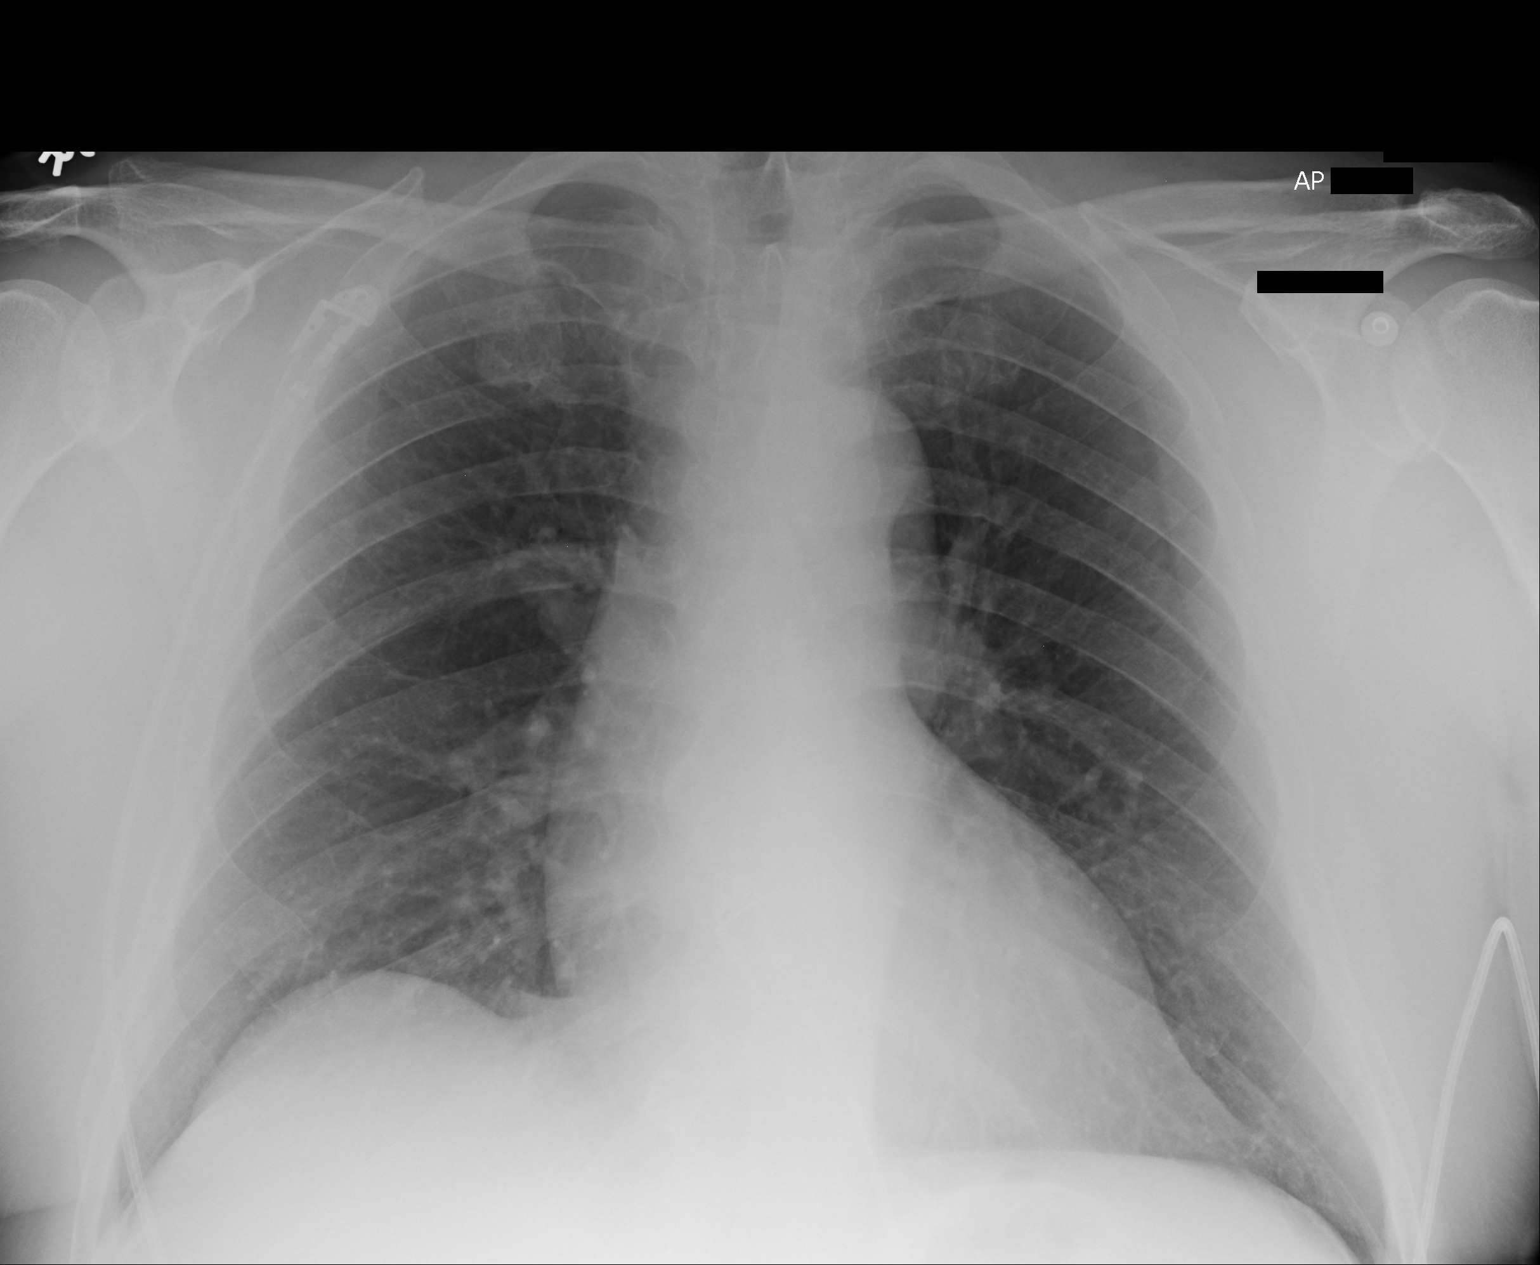
[im 2/2]
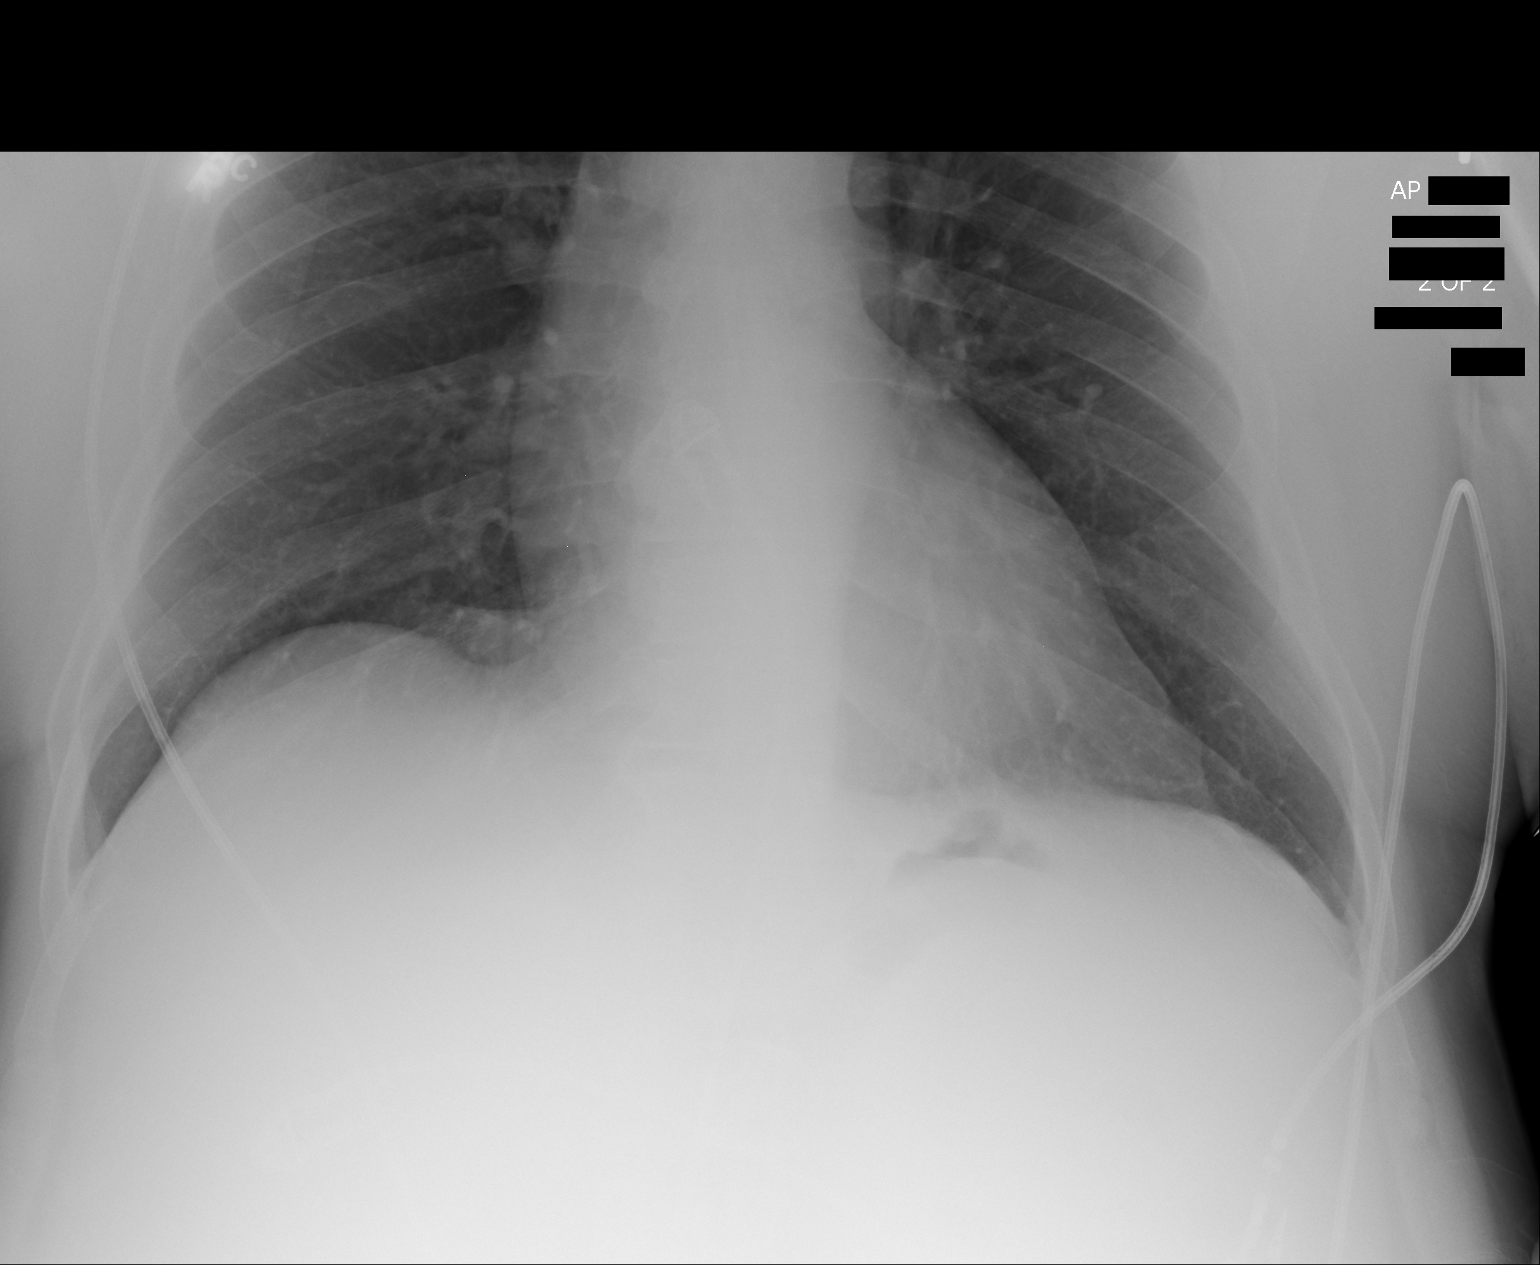

[2 of 2 positions shown; findings below may reference images not displayed]

FINDINGS: Normal heart size. Prominent anterior 1st rib. Clear lungs. No
pneumothorax or pleural effusion. Normal vascularity
IMPRESSION: No active disease.

## 2013-12-30 LAB — PSA

## 2014-11-30 IMAGING — US US BIOPSY
1 series · 13 of 25 positions shown · non-contrast
Comparison: none

INDICATION: 60-year-old with a left neck mass. CT imaging suggest a left parotid
lesion and associated cervical lymphadenopathy. Patient needs tissue
diagnosis.

[Series 1: us biopsy · 0.07mm/px · 13 of 31 slices shown]
[im 1/31]
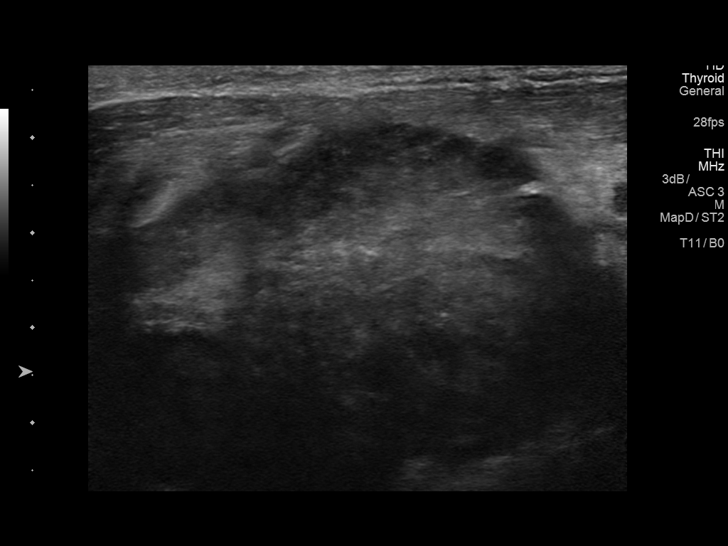
[im 3/31]
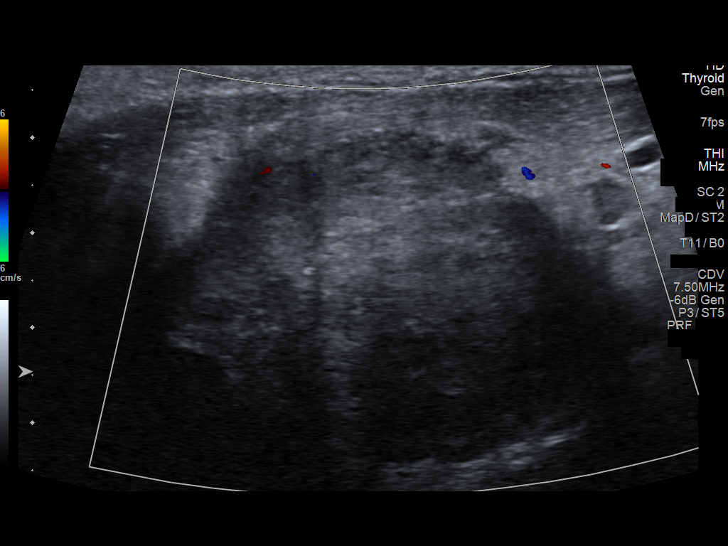
[im 6/31]
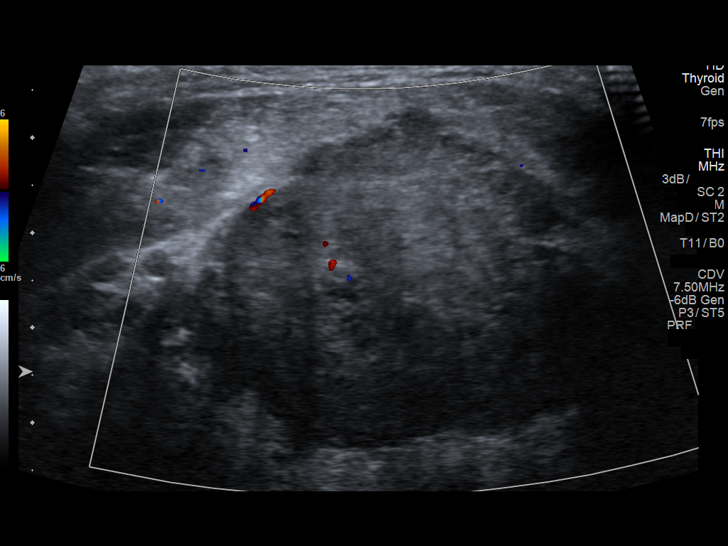
[im 8/31]
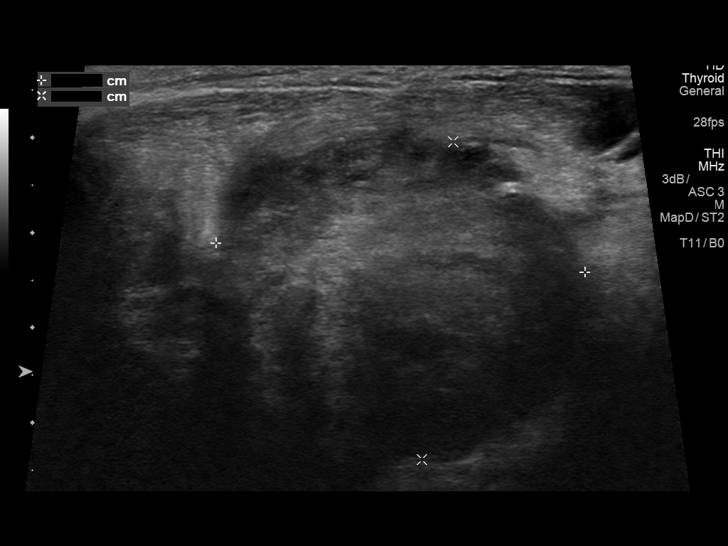
[im 11/31]
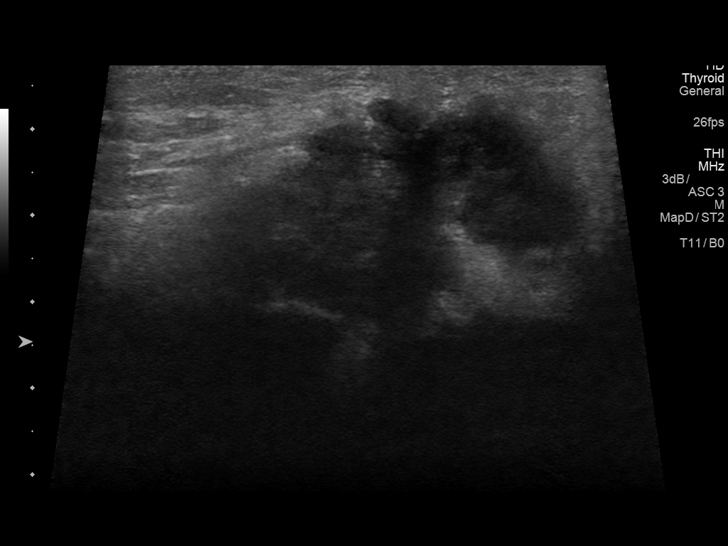
[im 13/31]
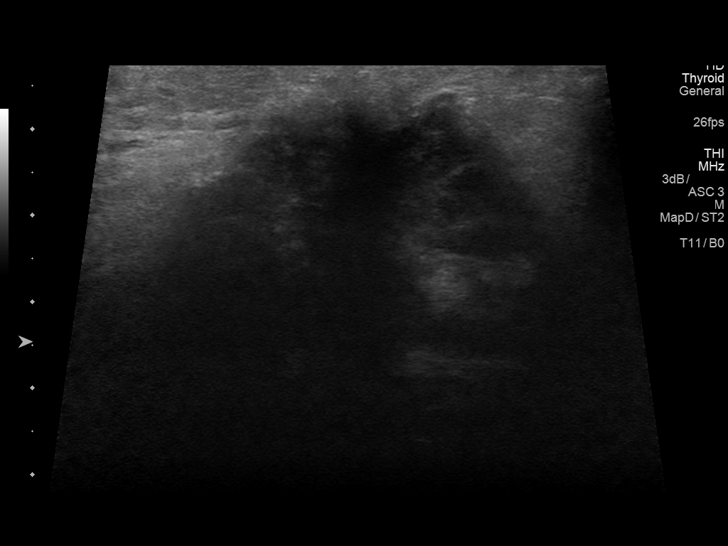
[im 16/31]
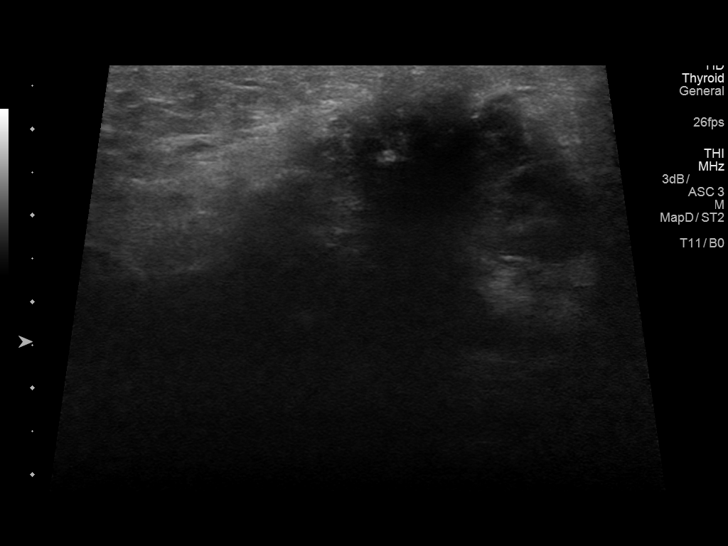
[im 18/31]
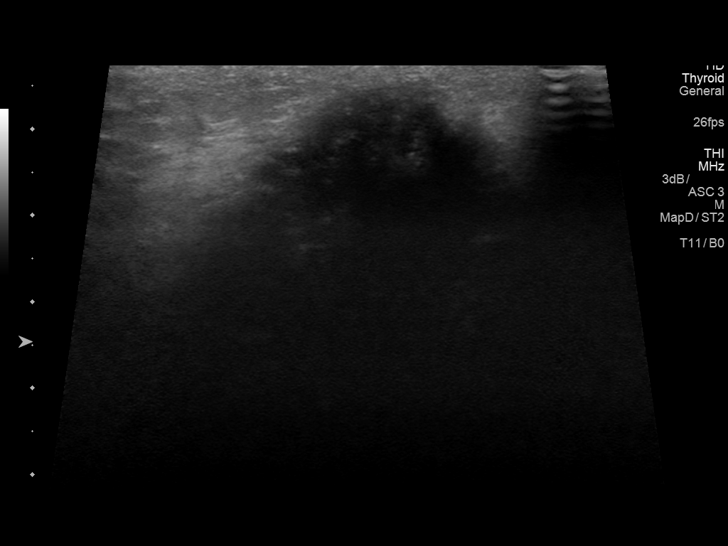
[im 21/31]
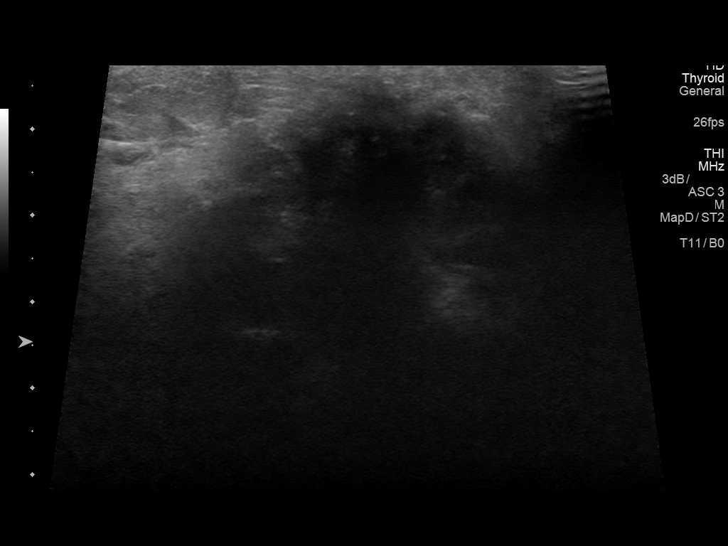
[im 23/31]
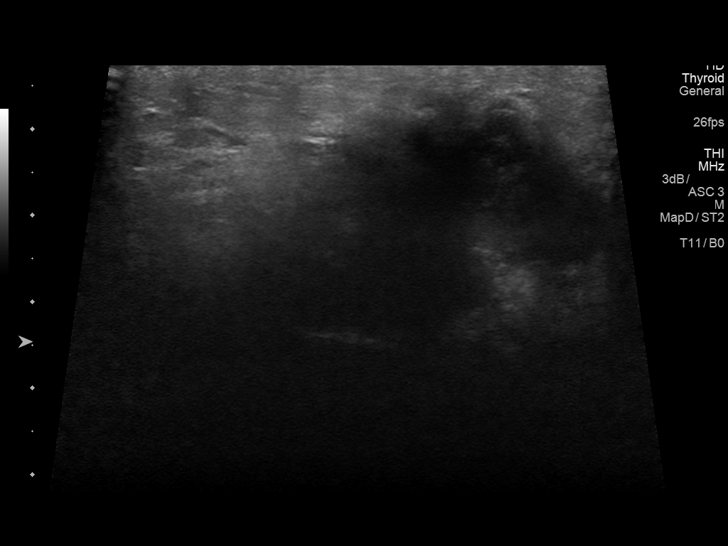
[im 26/31]
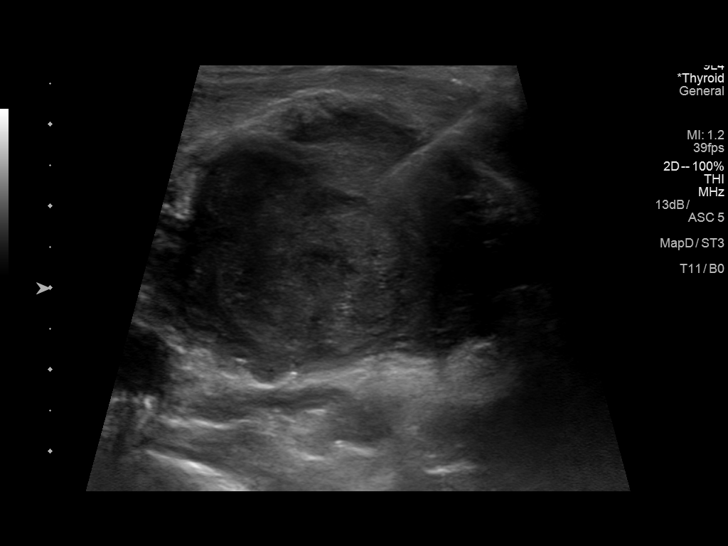
[im 28/31]
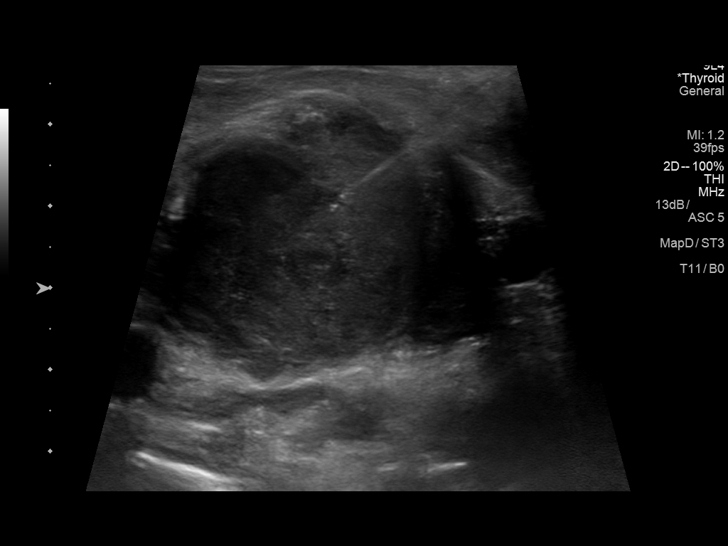
[im 31/31]
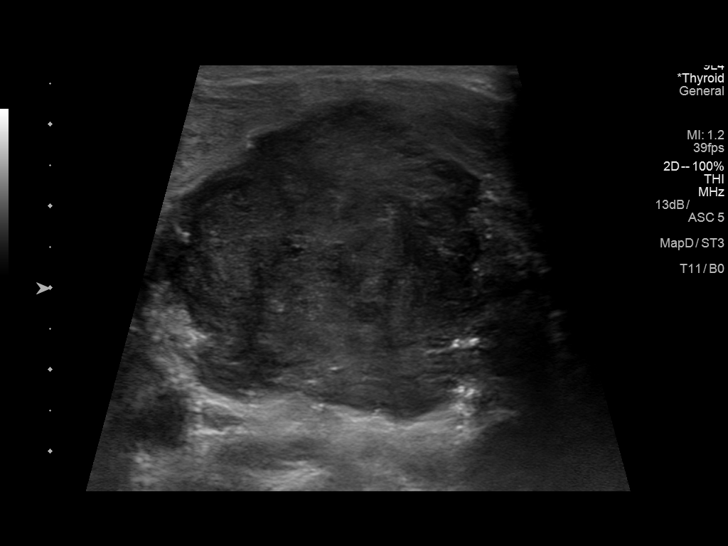

[13 of 25 positions shown; findings below may reference images not displayed]

EXAM:
ULTRASOUND-GUIDED FINE-NEEDLE ASPIRATION OF LEFT PAROTID LESION

ULTRASOUND-GUIDED CORE BIOPSY OF LEFT CERVICAL LYMPH NODE.

MEDICATIONS:
None.

ANESTHESIA/SEDATION:
Moderate (conscious) sedation was employed during this procedure. A
total of Versed 2.5 mg and Fentanyl 75 mcg was administered
intravenously.

Moderate Sedation Time: 26 minutes. The patient's level of
consciousness and vital signs were monitored continuously by
radiology nursing throughout the procedure under my direct
supervision.

FLUOROSCOPY TIME:  None

COMPLICATIONS:
None immediate.

PROCEDURE:
The procedure was explained to the patient. The risks and benefits
of the procedure were discussed and the patient's questions were
addressed. Informed consent was obtained from the patient. The left
side of the neck was evaluated ultrasound. Irregular hypoechoic
lesion in the parotid gland was identified. Bulky enlarged lymph
nodes were identified along the left side of the neck. Left side of
the neck was prepped with chlorhexidine. Sterile field was created.
Skin was anesthetized with 1% lidocaine. Using ultrasound guidance,
5 fine-needle aspirations were obtained within the parotid lesion
with 25 gauge needles. Attention was directed to the bulky
lymphadenopathy. The most cephalad enlarged lymph node was targeted.
Skin was anesthetized 1% lidocaine. 18 gauge needle directed in this
lesion with ultrasound guidance. A total of 5 core biopsies were
obtained with an 18 gauge device. One specimen was placed in flow
medium. Bandages placed at the puncture sites.
FINDINGS: There is an irregular shaped hypoechoic left parotid lesion
measuring at least 3.9 cm in greatest dimension. Fine-needle
aspirations were obtained of this lesion. Enlarged lymph node in the
left upper neck measured 3.7 x 3.9 x 3.4 cm. Core biopsies were
obtained from this lesion. No significant bleeding following the
procedure.
IMPRESSION: Successful ultrasound-guided fine-needle aspiration of the left
parotid lesion.

Successful ultrasound-guided core biopsies of a left cervical lymph
node.

## 2014-12-05 ENCOUNTER — Encounter: Payer: Self-pay | Admitting: Unknown Physician Specialty

## 2014-12-05 ENCOUNTER — Ambulatory Visit (INDEPENDENT_AMBULATORY_CARE_PROVIDER_SITE_OTHER): Payer: Managed Care, Other (non HMO) | Admitting: Unknown Physician Specialty

## 2014-12-05 VITALS — BP 164/91 | HR 99 | Temp 98.6°F | Ht 67.0 in | Wt 218.4 lb

## 2014-12-05 DIAGNOSIS — I10 Essential (primary) hypertension: Secondary | ICD-10-CM | POA: Insufficient documentation

## 2014-12-05 DIAGNOSIS — E1165 Type 2 diabetes mellitus with hyperglycemia: Secondary | ICD-10-CM

## 2014-12-05 DIAGNOSIS — K7581 Nonalcoholic steatohepatitis (NASH): Secondary | ICD-10-CM | POA: Insufficient documentation

## 2014-12-05 DIAGNOSIS — E1122 Type 2 diabetes mellitus with diabetic chronic kidney disease: Secondary | ICD-10-CM | POA: Diagnosis not present

## 2014-12-05 DIAGNOSIS — E785 Hyperlipidemia, unspecified: Secondary | ICD-10-CM | POA: Insufficient documentation

## 2014-12-05 DIAGNOSIS — N529 Male erectile dysfunction, unspecified: Secondary | ICD-10-CM | POA: Insufficient documentation

## 2014-12-05 DIAGNOSIS — E669 Obesity, unspecified: Secondary | ICD-10-CM

## 2014-12-05 DIAGNOSIS — E119 Type 2 diabetes mellitus without complications: Secondary | ICD-10-CM | POA: Insufficient documentation

## 2014-12-05 DIAGNOSIS — R0602 Shortness of breath: Secondary | ICD-10-CM

## 2014-12-05 DIAGNOSIS — I129 Hypertensive chronic kidney disease with stage 1 through stage 4 chronic kidney disease, or unspecified chronic kidney disease: Secondary | ICD-10-CM | POA: Diagnosis not present

## 2014-12-05 LAB — LIPID PANEL PICCOLO, WAIVED
CHOLESTEROL PICCOLO, WAIVED: 151 mg/dL (ref <200)
Chol/HDL Ratio Piccolo,Waive: 4.1 mg/dL
HDL CHOL PICCOLO, WAIVED: 37 mg/dL — AB (ref >59)
LDL Chol Calc Piccolo Waived: 71 mg/dL (ref <100)
Triglycerides Piccolo,Waived: 213 mg/dL — ABNORMAL HIGH (ref <150)
VLDL Chol Calc Piccolo,Waive: 43 mg/dL — ABNORMAL HIGH (ref <30)

## 2014-12-05 LAB — MICROALBUMIN, URINE WAIVED
Creatinine, Urine Waived: 100 mg/dL (ref 10–300)
Microalb, Ur Waived: 30 mg/L — ABNORMAL HIGH (ref 0–19)

## 2014-12-05 LAB — BAYER DCA HB A1C WAIVED: HB A1C (BAYER DCA - WAIVED): 13 % — ABNORMAL HIGH (ref <7.0)

## 2014-12-05 MED ORDER — BLOOD GLUCOSE MONITOR KIT: PACK | Status: DC

## 2014-12-05 MED ORDER — LISINOPRIL-HYDROCHLOROTHIAZIDE 20-25 MG PO TABS: 1.0000 | ORAL_TABLET | Freq: Every day | ORAL | Status: DC

## 2014-12-05 MED ORDER — SITAGLIPTIN PHOS-METFORMIN HCL 50-1000 MG PO TABS: 1.0000 | ORAL_TABLET | Freq: Two times a day (BID) | ORAL | Status: DC

## 2014-12-05 NOTE — Assessment & Plan Note (Signed)
Poorly controlled. Non-compliant with medication. Refused diabetes education. Provided insulin samples and prescribed oral agent refills along with new meter. Will recheck in one month.

## 2014-12-05 NOTE — Assessment & Plan Note (Signed)
Uncontrolled blood pressure. Will re-evaluate at next visit

## 2014-12-05 NOTE — Patient Instructions (Signed)
Base your long acting insulin on your fasting (usually in the morning) blood sugar.  Increase long acting (daily insulin) 2 units if fasting blood sugar is greater than 120 nightly +.  Decrease by 2 units if fasting blood sugar is less than 95.

## 2014-12-05 NOTE — Progress Notes (Signed)
BP 164/91 mmHg  Pulse 99  Temp(Src) 98.6 F (37 C)  Ht '5\' 7"'$  (1.702 m)  Wt 218 lb 6.4 oz (99.066 kg)  BMI 34.20 kg/m2  SpO2 97%   Subjective:    Patient ID: Kent [name], male    DOB: 1955-04-07, 59 y.o.   MRN: [id number]  HPI: Kent [name] is a 59 y.o. male  Chief Complaint  Patient presents with  . Back Pain    pt states he has in lower back on the right side that runs down his leg  . Tachycardia    pt states he has had an episode of his heart racing about 3 or 4 weeks ago, states he drank lemon juice and that seemed to help   Right Hip Pain: Pain to right hip and radiating down the lateral aspect to the knee. Causes weakness to the affected leg. Has not tried anything to relief pain.   Tachycardia: He has an episode of heart palpitations about one month ago. He has been short of breath with exertion for the last month. 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 This requires several minutes of rest to recover and carry on. He has multiple risk factors including uncontrolled diabetes, uncontrolled hypertension and HTN chronic kidney disease. He is not a smoker and never has been. He denies further episodes of palpitations but is fatigued and SOB.   Diabetes: Poorly controlled diabetes with A1C of 13.0  today. He has not been taking his glargine insulin nightly as directed. He takes one and sometimes two sitagliptin daily (he should be taking two daily). His meter has been broken so he has not checked his blood sugar. He has refused a referral to a diabetes educator. He insists he knows what to eat just doesn't follow it. Provided extensive education on diabetic medications, potential complications. He is not up to date on his eye exam.   Hypertension: Poorly controlled. Denies missing any doses. He does not monitor his blood pressure at home but does have it checked on occasion at work. He states it always runs  high. He does not exercise.  Relevant past medical, surgical, family and social history reviewed and updated as indicated. Interim medical history since our last visit reviewed. Allergies and medications reviewed and updated.  Review of Systems  Constitutional: Negative.  Negative for fever, chills, activity change and appetite change.  HENT: Negative.  Negative for congestion, postnasal drip, rhinorrhea, sinus pressure and sore throat.   Eyes: Negative.  Negative for discharge and redness.  Respiratory: Negative for cough, chest tightness, shortness of breath and wheezing.   Cardiovascular: Negative.  Negative for chest pain, palpitations and leg swelling.  Gastrointestinal: Negative.  Negative for nausea, diarrhea and constipation.  Musculoskeletal: Positive for myalgias. Negative for back pain, joint swelling, arthralgias and gait problem.  Skin: Negative.  Negative for color change, pallor, rash and wound.  Neurological: Negative.  Negative for dizziness, weakness, light-headedness, numbness and headaches.  Psychiatric/Behavioral: Negative.  Negative for confusion, sleep disturbance, self-injury and agitation. The patient is not nervous/anxious.     Per HPI unless specifically indicated above     Objective:    BP 164/91 mmHg  Pulse 99  Temp(Src) 98.6 F (37 C)  Ht '5\' 7"'$  (1.702 m)  Wt 218 lb 6.4 oz (99.066 kg)  BMI 34.20 kg/m2  SpO2 97%  Wt Readings from Last 3 Encounters:  12/05/14 218 lb 6.4 oz (99.066 kg)  06/16/14 223 lb (101.152 kg)    Physical Exam  Constitutional: He is oriented to person, place, and time. He appears well-developed and well-nourished. No distress.  HENT:  Head: Normocephalic and atraumatic.  Eyes: Conjunctivae are normal.  Neck: Normal range of motion.  Cardiovascular: Normal rate, regular rhythm and normal heart sounds.  Exam reveals no gallop and no friction rub.   No murmur heard. Pulmonary/Chest: Effort normal and breath sounds normal. No  respiratory distress. He has no wheezes. He has no rales. He exhibits no tenderness.  Musculoskeletal: Normal range of motion. He exhibits no edema or tenderness.  Neurological: He is alert and oriented to person, place, and time.  Skin: Skin is warm and dry. No rash noted. He is not diaphoretic. No erythema. No pallor.  Psychiatric: He has a normal mood and affect. His behavior is normal. Judgment and thought content normal.   Diabetic Foot Exam - Simple   Simple Foot Form  Diabetic Foot exam was performed with the following findings:  Yes 12/05/2014  2:20 PM  Visual Inspection  No deformities, no ulcerations, no other skin breakdown bilaterally:  Yes  Sensation Testing  Intact to touch and monofilament testing bilaterally:  Yes  Pulse Check  Posterior Tibialis and Dorsalis pulse intact bilaterally:  Yes  Comments         Assessment & Plan:   Problem List Items Addressed This Visit      Unprioritized   Hypertension   Relevant Medications   lisinopril-hydrochlorothiazide (PRINZIDE,ZESTORETIC) 20-25 MG tablet   Other Relevant Orders   Comprehensive metabolic panel   Microalbumin, Urine Waived   Hypertensive CKD (chronic kidney disease)   Nonalcoholic steatohepatitis    Other Visit Diagnoses    SOB (shortness of breath) on exertion    -  Primary    Relevant Orders    Ambulatory referral to Cardiology    Poorly controlled type 2 diabetes mellitus ([place])        Relevant Medications    lisinopril-hydrochlorothiazide (PRINZIDE,ZESTORETIC) 20-25 MG tablet    sitaGLIPtin-metformin (JANUMET) 50-1000 MG tablet    Other Relevant Orders    Comprehensive metabolic panel    Bayer DCA Hb A1c Waived    Lipid Panel Piccolo, Waived    Microalbumin, Urine Waived    Uric acid       Instructed not to take viagra until he is seen by cardiology and his diabetes is under better control.  Follow up plan: Return in about 4 weeks (around 01/02/2015) for f/u for diabetes and  hypertension.

## 2014-12-06 LAB — COMPREHENSIVE METABOLIC PANEL
ALBUMIN: 4.1 g/dL (ref 3.6–4.8)
ALK PHOS: 107 IU/L (ref 39–117)
ALT: 30 IU/L (ref 0–44)
AST: 20 IU/L (ref 0–40)
Albumin/Globulin Ratio: 1.6 (ref 1.1–2.5)
BUN / CREAT RATIO: 11 (ref 10–22)
BUN: 13 mg/dL (ref 8–27)
Bilirubin Total: 0.3 mg/dL (ref 0.0–1.2)
CO2: 22 mmol/L (ref 18–29)
CREATININE: 1.15 mg/dL (ref 0.76–1.27)
Calcium: 9.4 mg/dL (ref 8.6–10.2)
Chloride: 93 mmol/L — ABNORMAL LOW (ref 97–108)
GFR calc non Af Amer: 69 mL/min/{1.73_m2} (ref >59)
GFR, EST AFRICAN AMERICAN: 80 mL/min/{1.73_m2} (ref >59)
GLOBULIN, TOTAL: 2.6 g/dL (ref 1.5–4.5)
GLUCOSE: 350 mg/dL — AB (ref 65–99)
Potassium: 3.9 mmol/L (ref 3.5–5.2)
SODIUM: 137 mmol/L (ref 134–144)
TOTAL PROTEIN: 6.7 g/dL (ref 6.0–8.5)

## 2014-12-06 LAB — URIC ACID: URIC ACID: 4.9 mg/dL (ref 3.7–8.6)

## 2015-01-03 ENCOUNTER — Ambulatory Visit: Payer: Managed Care, Other (non HMO) | Admitting: Unknown Physician Specialty

## 2015-01-24 ENCOUNTER — Encounter: Payer: Self-pay | Admitting: *Deleted

## 2015-01-24 ENCOUNTER — Ambulatory Visit: Payer: Self-pay | Admitting: Cardiovascular Disease

## 2015-01-24 DIAGNOSIS — R0989 Other specified symptoms and signs involving the circulatory and respiratory systems: Secondary | ICD-10-CM

## 2015-03-28 ENCOUNTER — Other Ambulatory Visit: Payer: Self-pay

## 2015-03-28 DIAGNOSIS — I10 Essential (primary) hypertension: Secondary | ICD-10-CM

## 2015-03-28 MED ORDER — LISINOPRIL-HYDROCHLOROTHIAZIDE 20-25 MG PO TABS: 1.0000 | ORAL_TABLET | Freq: Every day | ORAL | Status: DC

## 2015-03-28 NOTE — Telephone Encounter (Signed)
Patient was last seen 12/05/14 and according to last note, pt was supposed to f/u in 4 weeks after that appointment but did not. Pharmacy is [facility]. Malachy Mood please route this back to me if you want me to call to make patient an appointment.

## 2015-03-28 NOTE — Telephone Encounter (Signed)
Called and a man answered the phone and stated that the patient was not in right now. He stated that the patient did not get off work until 5 and I asked if there was another number that I could reach the patient at. The man said he was going to look for a number and give me a call back.

## 2015-03-29 NOTE — Telephone Encounter (Signed)
Looked in practice partner and got a work number for the patient. So I called and scheduled him a follow-up for 04/06/15.

## 2015-04-06 ENCOUNTER — Ambulatory Visit (INDEPENDENT_AMBULATORY_CARE_PROVIDER_SITE_OTHER): Payer: Managed Care, Other (non HMO) | Admitting: Unknown Physician Specialty

## 2015-04-06 ENCOUNTER — Encounter: Payer: Self-pay | Admitting: Unknown Physician Specialty

## 2015-04-06 VITALS — BP 169/101 | HR 83 | Temp 97.5°F | Ht 66.0 in | Wt 217.4 lb

## 2015-04-06 DIAGNOSIS — I129 Hypertensive chronic kidney disease with stage 1 through stage 4 chronic kidney disease, or unspecified chronic kidney disease: Secondary | ICD-10-CM | POA: Diagnosis not present

## 2015-04-06 DIAGNOSIS — M545 Low back pain, unspecified: Secondary | ICD-10-CM | POA: Insufficient documentation

## 2015-04-06 DIAGNOSIS — E1165 Type 2 diabetes mellitus with hyperglycemia: Secondary | ICD-10-CM

## 2015-04-06 DIAGNOSIS — E785 Hyperlipidemia, unspecified: Secondary | ICD-10-CM

## 2015-04-06 DIAGNOSIS — I10 Essential (primary) hypertension: Secondary | ICD-10-CM | POA: Diagnosis not present

## 2015-04-06 DIAGNOSIS — E1122 Type 2 diabetes mellitus with diabetic chronic kidney disease: Secondary | ICD-10-CM | POA: Diagnosis not present

## 2015-04-06 LAB — BAYER DCA HB A1C WAIVED: HB A1C: 6.4 % (ref <7.0)

## 2015-04-06 MED ORDER — INSULIN GLARGINE 100 UNIT/ML SOLOSTAR PEN: 36.0000 [IU] | PEN_INJECTOR | Freq: Every day | SUBCUTANEOUS | Status: DC

## 2015-04-06 MED ORDER — METFORMIN HCL 1000 MG PO TABS: 1000.0000 mg | ORAL_TABLET | Freq: Two times a day (BID) | ORAL | Status: DC

## 2015-04-06 MED ORDER — LISINOPRIL-HYDROCHLOROTHIAZIDE 20-25 MG PO TABS: 1.0000 | ORAL_TABLET | Freq: Every day | ORAL | Status: DC

## 2015-04-06 MED ORDER — ATORVASTATIN CALCIUM 20 MG PO TABS: 20.0000 mg | ORAL_TABLET | Freq: Every day | ORAL | Status: DC

## 2015-04-06 MED ORDER — CYCLOBENZAPRINE HCL 10 MG PO TABS: 10.0000 mg | ORAL_TABLET | Freq: Every day | ORAL | Status: DC

## 2015-04-06 NOTE — Assessment & Plan Note (Addendum)
Hgb A1C is 6.4 and is at goal.  Continue present meds.  Taking Janumet once a day but I will DC as insurance not covering.  Will change to Metformin only

## 2015-04-06 NOTE — Assessment & Plan Note (Signed)
Biggest problem is at night.  Rx for Flexeril to take QHS

## 2015-04-06 NOTE — Assessment & Plan Note (Signed)
Restart Atorvastatin.  Not checked today due to non-compliance

## 2015-04-06 NOTE — Progress Notes (Signed)
BP 169/101 mmHg  Pulse 83  Temp(Src) 97.5 F (36.4 C)  Ht '5\' 6"'$  (1.676 m)  Wt 217 lb 6.4 oz (98.612 kg)  BMI 35.11 kg/m2  SpO2 96%   Subjective:    Patient ID: Kent [name], male    DOB: May 16, 1955, 60 y.o.   MRN: [id number]  HPI: Kent [name] is a 60 y.o. male  Chief Complaint  Patient presents with  . Diabetes  . Hypertension  this pt is lost to f/u as we was scheduled to be seen 2 months ago for poorly controlled diabetes.  He is not always taking his medications and it is unclear how often he is compliant  Diabetes:  Taking his medication sometimes Insulin is 36 untis daily No hypoglycemic episodes No hyperglycemic episodes Feet problems: none Blood Sugars averaging:  eye exam within last year Last Hgb A1C  was 13.0  Hypertension:  Ran out of BP medicine last week and just started taking it yesterday Average home BPs: Not checking   Using medication without problems or lightheadedness No chest pain with exertion or shortness of breath No Edema  Elevated Cholesterol: Not taking Atorvastain Using medications without problems: No Muscle aches Diet compliance: poor Exercise: poor  Back and leg pain Takes Ibuprofen.  This has been going on for quite some time.  Never saw anybody   Relevant past medical, surgical, family and social history reviewed and updated as indicated. Interim medical history since our last visit reviewed. Allergies and medications reviewed and updated.  Review of Systems  Per HPI unless specifically indicated above     Objective:    BP 169/101 mmHg  Pulse 83  Temp(Src) 97.5 F (36.4 C)  Ht '5\' 6"'$  (1.676 m)  Wt 217 lb 6.4 oz (98.612 kg)  BMI 35.11 kg/m2  SpO2 96%  Wt Readings from Last 3 Encounters:  04/06/15 217 lb 6.4 oz (98.612 kg)  12/05/14 218 lb 6.4 oz (99.066 kg)  06/16/14 223 lb (101.152 kg)    Physical Exam  Constitutional: He is oriented to person, place, and time. He appears well-developed and well-nourished.  No distress.  HENT:  Head: Normocephalic and atraumatic.  Eyes: Conjunctivae and lids are normal. Right eye exhibits no discharge. Left eye exhibits no discharge. No scleral icterus.  Neck: Normal range of motion. Neck supple. No JVD present. Carotid bruit is not present.  Cardiovascular: Normal rate, regular rhythm and normal heart sounds.   Pulmonary/Chest: Effort normal and breath sounds normal. No respiratory distress.  Abdominal: Normal appearance. There is no splenomegaly or hepatomegaly.  Musculoskeletal: Normal range of motion.  Neurological: He is alert and oriented to person, place, and time.  Skin: Skin is warm, dry and intact. No rash noted. No pallor.  Psychiatric: He has a normal mood and affect. His behavior is normal. Judgment and thought content normal.    Results for orders placed or performed in visit on 12/05/14  Comprehensive metabolic panel  Result Value Ref Range   Glucose 350 (H) 65 - 99 mg/dL   BUN 13 8 - 27 mg/dL   Creatinine, Ser 1.15 0.76 - 1.27 mg/dL   GFR calc non Af Amer 69 >59 mL/min/1.73   GFR calc Af Amer 80 >59 mL/min/1.73   BUN/Creatinine Ratio 11 10 - 22   Sodium 137 134 - 144 mmol/L   Potassium 3.9 3.5 - 5.2 mmol/L   Chloride 93 (L) 97 - 108 mmol/L   CO2 22 18 - 29 mmol/L  Calcium 9.4 8.6 - 10.2 mg/dL   Total Protein 6.7 6.0 - 8.5 g/dL   Albumin 4.1 3.6 - 4.8 g/dL   Globulin, Total 2.6 1.5 - 4.5 g/dL   Albumin/Globulin Ratio 1.6 1.1 - 2.5   Bilirubin Total 0.3 0.0 - 1.2 mg/dL   Alkaline Phosphatase 107 39 - 117 IU/L   AST 20 0 - 40 IU/L   ALT 30 0 - 44 IU/L  Bayer DCA Hb A1c Waived  Result Value Ref Range   Bayer DCA Hb A1c Waived 13.0 (H) <7.0 %  Lipid Panel Piccolo, Waived  Result Value Ref Range   Cholesterol Piccolo, Waived 151 <200 mg/dL   HDL Chol Piccolo, Waived 37 (L) >59 mg/dL   Triglycerides Piccolo,Waived 213 (H) <150 mg/dL   Chol/HDL Ratio Piccolo,Waive 4.1 mg/dL   LDL Chol Calc Piccolo Waived 71 <100 mg/dL   VLDL Chol  Calc Piccolo,Waive 43 (H) <30 mg/dL  Microalbumin, Urine Waived  Result Value Ref Range   Microalb, Ur Waived 30 (H) 0 - 19 mg/L   Creatinine, Urine Waived 100 10 - 300 mg/dL   Microalb/Creat Ratio 30-300 (H) <30 mg/g  Uric acid  Result Value Ref Range   Uric Acid 4.9 3.7 - 8.6 mg/dL      Assessment & Plan:   Problem List Items Addressed This Visit      Unprioritized   Hypertension    Not to goal.  Out of medications      Relevant Medications   atorvastatin (LIPITOR) 20 MG tablet   lisinopril-hydrochlorothiazide (PRINZIDE,ZESTORETIC) 20-25 MG tablet   Other Relevant Orders   Comprehensive metabolic panel   Diabetes (HCC) - Primary    Hgb A1C is 6.4 and is at goal.  Continue present meds.  Taking Janumet once a day but I will DC as insurance not covering.  Will change to Metformin only      Relevant Medications   atorvastatin (LIPITOR) 20 MG tablet   lisinopril-hydrochlorothiazide (PRINZIDE,ZESTORETIC) 20-25 MG tablet   Insulin Glargine (LANTUS) 100 UNIT/ML Solostar Pen   metFORMIN (GLUCOPHAGE) 1000 MG tablet   Other Relevant Orders   Comprehensive metabolic panel   Bayer DCA Hb A1c Waived   Hypertensive CKD (chronic kidney disease)   Relevant Orders   Comprehensive metabolic panel   Hyperlipidemia    Restart Atorvastatin.  Not checked today due to non-compliance      Relevant Medications   atorvastatin (LIPITOR) 20 MG tablet   lisinopril-hydrochlorothiazide (PRINZIDE,ZESTORETIC) 20-25 MG tablet   Low back pain    Biggest problem is at night.  Rx for Flexeril to take QHS      Relevant Medications   cyclobenzaprine (FLEXERIL) 10 MG tablet    Other Visit Diagnoses    Poorly controlled type 2 diabetes mellitus (HCC)        Relevant Medications    atorvastatin (LIPITOR) 20 MG tablet    lisinopril-hydrochlorothiazide (PRINZIDE,ZESTORETIC) 20-25 MG tablet    Insulin Glargine (LANTUS) 100 UNIT/ML Solostar Pen    metFORMIN (GLUCOPHAGE) 1000 MG tablet         Follow up plan: Return in about 1 month (around 05/04/2015).

## 2015-04-06 NOTE — Assessment & Plan Note (Signed)
Not to goal.  Out of medications

## 2015-04-07 LAB — COMPREHENSIVE METABOLIC PANEL
ALT: 24 IU/L (ref 0–44)
AST: 21 IU/L (ref 0–40)
Albumin/Globulin Ratio: 1.5 (ref 1.1–2.5)
Albumin: 4.5 g/dL (ref 3.6–4.8)
Alkaline Phosphatase: 83 IU/L (ref 39–117)
BILIRUBIN TOTAL: 0.4 mg/dL (ref 0.0–1.2)
BUN / CREAT RATIO: 17 (ref 10–22)
BUN: 18 mg/dL (ref 8–27)
CALCIUM: 9.8 mg/dL (ref 8.6–10.2)
CHLORIDE: 99 mmol/L (ref 96–106)
CO2: 26 mmol/L (ref 18–29)
CREATININE: 1.07 mg/dL (ref 0.76–1.27)
GFR calc non Af Amer: 75 mL/min/{1.73_m2} (ref >59)
GFR, EST AFRICAN AMERICAN: 87 mL/min/{1.73_m2} (ref >59)
GLOBULIN, TOTAL: 3 g/dL (ref 1.5–4.5)
Glucose: 108 mg/dL — ABNORMAL HIGH (ref 65–99)
Potassium: 4 mmol/L (ref 3.5–5.2)
SODIUM: 142 mmol/L (ref 134–144)
Total Protein: 7.5 g/dL (ref 6.0–8.5)

## 2015-05-04 ENCOUNTER — Ambulatory Visit: Payer: Managed Care, Other (non HMO) | Admitting: Unknown Physician Specialty

## 2015-05-07 ENCOUNTER — Emergency Department
Admission: EM | Admit: 2015-05-07 | Discharge: 2015-05-07 | Disposition: A | Discharge status: Home / Self Care | Payer: Managed Care, Other (non HMO) | Attending: Emergency Medicine | Admitting: Emergency Medicine

## 2015-05-07 ENCOUNTER — Encounter: Payer: Self-pay | Admitting: Emergency Medicine

## 2015-05-07 DIAGNOSIS — M545 Low back pain: Secondary | ICD-10-CM | POA: Diagnosis present

## 2015-05-07 DIAGNOSIS — Z79899 Other long term (current) drug therapy: Secondary | ICD-10-CM | POA: Diagnosis not present

## 2015-05-07 DIAGNOSIS — Z7984 Long term (current) use of oral hypoglycemic drugs: Secondary | ICD-10-CM | POA: Diagnosis not present

## 2015-05-07 DIAGNOSIS — N189 Chronic kidney disease, unspecified: Secondary | ICD-10-CM | POA: Insufficient documentation

## 2015-05-07 DIAGNOSIS — E119 Type 2 diabetes mellitus without complications: Secondary | ICD-10-CM | POA: Diagnosis not present

## 2015-05-07 DIAGNOSIS — I129 Hypertensive chronic kidney disease with stage 1 through stage 4 chronic kidney disease, or unspecified chronic kidney disease: Secondary | ICD-10-CM | POA: Insufficient documentation

## 2015-05-07 DIAGNOSIS — Z794 Long term (current) use of insulin: Secondary | ICD-10-CM | POA: Insufficient documentation

## 2015-05-07 DIAGNOSIS — G8929 Other chronic pain: Secondary | ICD-10-CM | POA: Insufficient documentation

## 2015-05-07 DIAGNOSIS — M5441 Lumbago with sciatica, right side: Secondary | ICD-10-CM | POA: Insufficient documentation

## 2015-05-07 MED ORDER — MELOXICAM 15 MG PO TABS: 15.0000 mg | ORAL_TABLET | Freq: Every day | ORAL | Status: DC

## 2015-05-07 MED ORDER — KETOROLAC TROMETHAMINE 60 MG/2ML IM SOLN
30.0000 mg | Freq: Once | INTRAMUSCULAR | Status: AC
  Administered 2015-05-07: 30 mg via INTRAMUSCULAR

## 2015-05-07 MED ORDER — KETOROLAC TROMETHAMINE 30 MG/ML IJ SOLN
30.0000 mg | Freq: Once | INTRAMUSCULAR | Status: DC
  Filled 2015-05-07: qty 1

## 2015-05-07 NOTE — ED Notes (Signed)
C/o lower back pain x 2 years.  Worse today.

## 2015-05-07 NOTE — Discharge Instructions (Signed)
Chronic Back Pain ° When back pain lasts longer than 3 months, it is called chronic back pain. People with chronic back pain often go through certain periods that are more intense (flare-ups).  °CAUSES °Chronic back pain can be caused by wear and tear (degeneration) on different structures in your back. These structures include: °· The bones of your spine (vertebrae) and the joints surrounding your spinal cord and nerve roots (facets). °· The strong, fibrous tissues that connect your vertebrae (ligaments). °Degeneration of these structures may result in pressure on your nerves. This can lead to constant pain. °HOME CARE INSTRUCTIONS °· Avoid bending, heavy lifting, prolonged sitting, and activities which make the problem worse. °· Take brief periods of rest throughout the day to reduce your pain. Lying down or standing usually is better than sitting while you are resting. °· Take over-the-counter or prescription medicines only as directed by your caregiver. °SEEK IMMEDIATE MEDICAL CARE IF:  °· You have weakness or numbness in one of your legs or feet. °· You have trouble controlling your bladder or bowels. °· You have nausea, vomiting, abdominal pain, shortness of breath, or fainting. °  °This information is not intended to replace advice given to you by your health care provider. Make sure you discuss any questions you have with your health care provider. °  °Document Released: 03/27/2004 Document Revised: 05/12/2011 Document Reviewed: 08/07/2014 °Elsevier Interactive Patient Education ©2016 [employer] ° °Sciatica °Sciatica is pain, weakness, numbness, or tingling along the path of the sciatic nerve. The nerve starts in the lower back and runs down the back of each leg. The nerve controls the muscles in the lower leg and in the back of the knee, while also providing sensation to the back of the thigh, lower leg, and the sole of your foot. Sciatica is a symptom of another medical condition. For instance, nerve  damage or certain conditions, such as a herniated disk or bone spur on the spine, pinch or put pressure on the sciatic nerve. This causes the pain, weakness, or other sensations normally associated with sciatica. Generally, sciatica only affects one side of the body. °CAUSES  °· Herniated or slipped disc. °· Degenerative disk disease. °· A pain disorder involving the narrow muscle in the buttocks (piriformis syndrome). °· Pelvic injury or fracture. °· Pregnancy. °· Tumor (rare). °SYMPTOMS  °Symptoms can vary from mild to very severe. The symptoms usually travel from the low back to the buttocks and down the back of the leg. Symptoms can include: °· Mild tingling or dull aches in the lower back, leg, or hip. °· Numbness in the back of the calf or sole of the foot. °· Burning sensations in the lower back, leg, or hip. °· Sharp pains in the lower back, leg, or hip. °· Leg weakness. °· Severe back pain inhibiting movement. °These symptoms may get worse with coughing, sneezing, laughing, or prolonged sitting or standing. Also, being overweight may worsen symptoms. °DIAGNOSIS  °Your caregiver will perform a physical exam to look for common symptoms of sciatica. He or she may ask you to do certain movements or activities that would trigger sciatic nerve pain. Other tests may be performed to find the cause of the sciatica. These may include: °· Blood tests. °· X-rays. °· Imaging tests, such as an MRI or CT scan. °TREATMENT  °Treatment is directed at the cause of the sciatic pain. Sometimes, treatment is not necessary and the pain and discomfort goes away on its own. If treatment is needed, your   caregiver may suggest: °· Over-the-counter medicines to relieve pain. °· Prescription medicines, such as anti-inflammatory medicine, muscle relaxants, or narcotics. °· Applying heat or ice to the painful area. °· Steroid injections to lessen pain, irritation, and inflammation around the nerve. °· Reducing activity during periods of  pain. °· Exercising and stretching to strengthen your abdomen and improve flexibility of your spine. Your caregiver may suggest losing weight if the extra weight makes the back pain worse. °· Physical therapy. °· Surgery to eliminate what is pressing or pinching the nerve, such as a bone spur or part of a herniated disk. °HOME CARE INSTRUCTIONS  °· Only take over-the-counter or prescription medicines for pain or discomfort as directed by your caregiver. °· Apply ice to the affected area for 20 minutes, 3-4 times a day for the first 48-72 hours. Then try heat in the same way. °· Exercise, stretch, or perform your usual activities if these do not aggravate your pain. °· Attend physical therapy sessions as directed by your caregiver. °· Keep all follow-up appointments as directed by your caregiver. °· Do not wear high heels or shoes that do not provide proper support. °· Check your mattress to see if it is too soft. A firm mattress may lessen your pain and discomfort. °SEEK IMMEDIATE MEDICAL CARE IF:  °· You lose control of your bowel or bladder (incontinence). °· You have increasing weakness in the lower back, pelvis, buttocks, or legs. °· You have redness or swelling of your back. °· You have a burning sensation when you urinate. °· You have pain that gets worse when you lie down or awakens you at night. °· Your pain is worse than you have experienced in the past. °· Your pain is lasting longer than 4 weeks. °· You are suddenly losing weight without reason. °MAKE SURE YOU: °· Understand these instructions. °· Will watch your condition. °· Will get help right away if you are not doing well or get worse. °  °This information is not intended to replace advice given to you by your health care provider. Make sure you discuss any questions you have with your health care provider. °  °Document Released: 02/11/2001 Document Revised: 11/08/2014 Document Reviewed: 06/29/2011 °Elsevier Interactive Patient Education ©2016  Elsevier Inc. ° °

## 2015-05-07 NOTE — ED Notes (Signed)
Having pain from lower back which radiates into right leg. States this has been going on for several months   No recent injury. alos states his right leg will give out occasionally

## 2015-05-07 NOTE — ED Provider Notes (Signed)
[facility] Emergency Department Provider Note  ____________________________________________  Time seen: Approximately 12:49 PM  I have reviewed the triage vital signs and the nursing notes.   HISTORY  Chief Complaint Back Pain    HPI Kent [name] is a 60 y.o. male who presents to emergency department complaining of lower back pain that radiates to his right knee. Patient states that this has been ongoing for 1-2 years. It is worse today. Patient states that the pain is a shooting sensation that travels from his lower back to his right leg. He denies any bowel or bladder dysfunction. He denies any numbness or tingling in his feet.Patient denies any injury precipitating the symptoms.   Past Medical History  Diagnosis Date  . Hypertension   . Diabetes mellitus without complication ([place])   . Hyperlipidemia   . Obesity     Patient Active Problem List   Diagnosis Date Noted  . Low back pain 04/06/2015  . Hypertension 12/05/2014  . Diabetes ([place]) 12/05/2014  . Hypertensive CKD (chronic kidney disease) 12/05/2014  . Hyperlipidemia 12/05/2014  . Class II obesity 12/05/2014  . Erectile dysfunction 12/05/2014  . Nonalcoholic steatohepatitis 02/40/9735    Past Surgical History  Procedure Laterality Date  . Gastric tumor removed  2010    Current Outpatient Rx  Name  Route  Sig  Dispense  Refill  . atorvastatin (LIPITOR) 20 MG tablet   Oral   Take 1 tablet (20 mg total) by mouth daily. Reported on 04/06/2015   90 tablet   1   . blood glucose meter kit and supplies KIT      Dispense based on patient and insurance preference. Use up to four times daily as directed. (FOR ICD-9 250.00, 250.01).   1 each   12   . cyclobenzaprine (FLEXERIL) 10 MG tablet   Oral   Take 1 tablet (10 mg total) by mouth at bedtime.   30 tablet   2   . Insulin Glargine (LANTUS) 100 UNIT/ML Solostar Pen   Subcutaneous   Inject 36 Units into the skin daily at 10 pm.   4  pen   3   . Insulin Glargine (TOUJEO SOLOSTAR) 300 UNIT/ML SOPN   Subcutaneous   Inject 36 Units into the skin daily.          Marland Kitchen lisinopril-hydrochlorothiazide (PRINZIDE,ZESTORETIC) 20-25 MG tablet   Oral   Take 1 tablet by mouth daily.   90 tablet   1   . meloxicam (MOBIC) 15 MG tablet   Oral   Take 1 tablet (15 mg total) by mouth daily.   30 tablet   0   . metFORMIN (GLUCOPHAGE) 1000 MG tablet   Oral   Take 1 tablet (1,000 mg total) by mouth 2 (two) times daily with a meal.   180 tablet   3   . sildenafil (VIAGRA) 50 MG tablet   Oral   Take 50 mg by mouth daily as needed for erectile dysfunction.         . sitaGLIPtin-metformin (JANUMET) 50-1000 MG tablet   Oral   Take 1 tablet by mouth 2 (two) times daily with a meal. Patient taking differently: Take 1 tablet by mouth daily.    60 tablet   1     Allergies Review of patient's allergies indicates no known allergies.  Family History  Problem Relation Age of Onset  . Heart disease Mother   . Hypertension Father   . Cancer Sister  stomach  . Heart disease Brother     MI  . Cancer Daughter   . Cancer Son     testicular  . ADD / ADHD Sister     Social History Social History  Substance Use Topics  . Smoking status: Never Smoker   . Smokeless tobacco: Never Used  . Alcohol Use: No     Review of Systems  Constitutional: No fever/chills Musculoskeletal: Positive for back pain that radiates into the right leg. Skin: Negative for rash. Neurological: Negative for headaches, focal weakness or numbness. 10-point ROS otherwise negative.  ____________________________________________   PHYSICAL EXAM:  VITAL SIGNS: ED Triage Vitals  Enc Vitals Group     BP 05/07/15 1158 139/88 mmHg     Pulse Rate 05/07/15 1158 80     Resp 05/07/15 1158 18     Temp 05/07/15 1158 98.2 F (36.8 C)     Temp Source 05/07/15 1158 Oral     SpO2 05/07/15 1158 97 %     Weight 05/07/15 1158 217 lb (98.431 kg)      Height 05/07/15 1158 5' 7"  (1.702 m)     Head Cir --      Peak Flow --      Pain Score 05/07/15 1153 9     Pain Loc --      Pain Edu? --      Excl. in [place]? --      Constitutional: Alert and oriented. Well appearing and in no acute distress. Eyes: Conjunctivae are normal. PERRL. EOMI. Head: Atraumatic.. Cardiovascular: Normal rate, regular rhythm. Normal S1 and S2.  Good peripheral circulation. Respiratory: Normal respiratory effort without tachypnea or retractions. Lungs CTAB. Musculoskeletal: No visible deformity to spine or right leg upon inspection. Patient is nontender to palpation midline spinal processes. Patient is diffusely tender to palpation over the right-sided paraspinal muscle groups. Patient is tender to palpation over the sciatic notch. Positive straight leg raise right side. Dorsalis pedis pulses appreciated bilaterally. Sensation intact and equal lower extremity. Neurologic:  Normal speech and language. No gross focal neurologic deficits are appreciated.  Skin:  Skin is warm, dry and intact. No rash noted. Psychiatric: Mood and affect are normal. Speech and behavior are normal. Patient exhibits appropriate insight and judgement.   ____________________________________________   LABS (all labs ordered are listed, but only abnormal results are displayed)  Labs Reviewed - No data to display ____________________________________________  EKG   ____________________________________________  RADIOLOGY   No results found.  ____________________________________________    PROCEDURES  Procedure(s) performed:       Medications  ketorolac (TORADOL) 30 MG/ML injection 30 mg (not administered)     ____________________________________________   INITIAL IMPRESSION / ASSESSMENT AND PLAN / ED COURSE  Pertinent labs & imaging results that were available during my care of the patient were reviewed by me and considered in my medical decision making (see chart for  details).  Patient's diagnosis is consistent with lumbago with right-sided sciatica. Patient does not have a history of injury precipitating these complaints. Complaints of an ongoing 2 years. No imaging is ordered today. Patient is given injection of Toradol here in the emergency department.. Patient will be discharged home with prescriptions for meloxicam. Patient is to follow up with orthopedics if symptoms persist past this treatment course. Patient is given ED precautions to return to the ED for any worsening or new symptoms.     ____________________________________________  FINAL CLINICAL IMPRESSION(S) / ED DIAGNOSES  Final diagnoses:  Chronic midline low  back pain with right-sided sciatica      NEW MEDICATIONS STARTED DURING THIS VISIT:  New Prescriptions   MELOXICAM (MOBIC) 15 MG TABLET    Take 1 tablet (15 mg total) by mouth daily.        This chart was dictated using voice recognition software/Dragon. Despite best efforts to proofread, errors can occur which can change the meaning. Any change was purely unintentional.    Darletta Moll, PA-C 05/07/15 [place], MD 05/07/15 [phone]

## 2015-05-07 NOTE — ED Notes (Signed)
Pt discharged home after verbalizing understanding of discharge instructions; nad noted. 

## 2015-08-09 ENCOUNTER — Other Ambulatory Visit: Payer: Self-pay | Admitting: Otolaryngology

## 2015-08-09 DIAGNOSIS — R221 Localized swelling, mass and lump, neck: Secondary | ICD-10-CM

## 2015-08-17 ENCOUNTER — Ambulatory Visit
Admission: RE | Admit: 2015-08-17 | Discharge: 2015-08-17 | Disposition: A | Discharge status: Home / Self Care | Payer: Managed Care, Other (non HMO) | Source: Ambulatory Visit | Attending: Otolaryngology | Admitting: Otolaryngology

## 2015-08-17 DIAGNOSIS — R590 Localized enlarged lymph nodes: Secondary | ICD-10-CM | POA: Insufficient documentation

## 2015-08-17 DIAGNOSIS — R221 Localized swelling, mass and lump, neck: Secondary | ICD-10-CM | POA: Insufficient documentation

## 2015-08-17 LAB — POCT I-STAT CREATININE: CREATININE: 0.9 mg/dL (ref 0.61–1.24)

## 2015-08-17 MED ORDER — IOPAMIDOL (ISOVUE-300) INJECTION 61%
75.0000 mL | Freq: Once | INTRAVENOUS | Status: AC | PRN
  Administered 2015-08-17: 75 mL via INTRAVENOUS

## 2015-08-21 ENCOUNTER — Other Ambulatory Visit: Payer: Self-pay | Admitting: Otolaryngology

## 2015-08-21 DIAGNOSIS — R591 Generalized enlarged lymph nodes: Secondary | ICD-10-CM

## 2015-08-21 DIAGNOSIS — R221 Localized swelling, mass and lump, neck: Secondary | ICD-10-CM

## 2015-08-24 ENCOUNTER — Ambulatory Visit
Admission: RE | Admit: 2015-08-24 | Discharge: 2015-08-24 | Disposition: A | Discharge status: Home / Self Care | Payer: Managed Care, Other (non HMO) | Source: Ambulatory Visit | Attending: Otolaryngology | Admitting: Otolaryngology

## 2015-08-24 ENCOUNTER — Other Ambulatory Visit: Payer: Self-pay | Admitting: Otolaryngology

## 2015-08-24 DIAGNOSIS — I10 Essential (primary) hypertension: Secondary | ICD-10-CM | POA: Insufficient documentation

## 2015-08-24 DIAGNOSIS — Z79899 Other long term (current) drug therapy: Secondary | ICD-10-CM | POA: Insufficient documentation

## 2015-08-24 DIAGNOSIS — R221 Localized swelling, mass and lump, neck: Secondary | ICD-10-CM | POA: Diagnosis present

## 2015-08-24 DIAGNOSIS — R590 Localized enlarged lymph nodes: Secondary | ICD-10-CM | POA: Diagnosis not present

## 2015-08-24 DIAGNOSIS — Z9889 Other specified postprocedural states: Secondary | ICD-10-CM | POA: Insufficient documentation

## 2015-08-24 DIAGNOSIS — E119 Type 2 diabetes mellitus without complications: Secondary | ICD-10-CM | POA: Diagnosis not present

## 2015-08-24 DIAGNOSIS — R591 Generalized enlarged lymph nodes: Secondary | ICD-10-CM

## 2015-08-24 DIAGNOSIS — Z794 Long term (current) use of insulin: Secondary | ICD-10-CM | POA: Insufficient documentation

## 2015-08-24 DIAGNOSIS — K118 Other diseases of salivary glands: Secondary | ICD-10-CM | POA: Diagnosis not present

## 2015-08-24 DIAGNOSIS — E669 Obesity, unspecified: Secondary | ICD-10-CM | POA: Insufficient documentation

## 2015-08-24 DIAGNOSIS — E785 Hyperlipidemia, unspecified: Secondary | ICD-10-CM | POA: Insufficient documentation

## 2015-08-24 HISTORY — DX: Unspecified osteoarthritis, unspecified site: M19.90

## 2015-08-24 MED ORDER — FENTANYL CITRATE (PF) 100 MCG/2ML IJ SOLN
INTRAMUSCULAR | Status: AC | PRN
  Administered 2015-08-24: 50 ug via INTRAVENOUS
  Administered 2015-08-24: 25 ug via INTRAVENOUS

## 2015-08-24 MED ORDER — MIDAZOLAM HCL 5 MG/5ML IJ SOLN
INTRAMUSCULAR | Status: AC
  Filled 2015-08-24: qty 5

## 2015-08-24 MED ORDER — SODIUM CHLORIDE 0.9 % IV SOLN
INTRAVENOUS | Status: DC
  Administered 2015-08-24: 11:00:00 via INTRAVENOUS

## 2015-08-24 MED ORDER — MIDAZOLAM HCL 5 MG/5ML IJ SOLN
INTRAMUSCULAR | Status: AC | PRN
  Administered 2015-08-24 (×2): 1 mg via INTRAVENOUS
  Administered 2015-08-24: 0.5 mg via INTRAVENOUS

## 2015-08-24 MED ORDER — FENTANYL CITRATE (PF) 100 MCG/2ML IJ SOLN
INTRAMUSCULAR | Status: AC
  Filled 2015-08-24: qty 2

## 2015-08-24 NOTE — Procedures (Signed)
US guided FNA of left parotid lesion (5 FNAs performed).  US guided core biopsies of enlarged left cervical lymph node (5 cores).  No immediate complication.  Minimal blood loss.

## 2015-08-24 NOTE — Progress Notes (Signed)
Chief Complaint: Patient was seen in consultation today for left neck parotid/lymph node biopsy at the request of Juengel,Paul  Referring Physician(s): Juengel,Paul  Patient Status: Outpatient  History of Present Illness: Kent [name] is a 60 y.o. male with a slowly enlarging mass in left neck.  CT demonstrated a left parotid lesion with bulky adenopathy.  Laryngoscopy was negative.  Patient needs tissue diagnosis.  No immediate problems.  No fevers or chills.  No breathing or swallowing problems.   Past Medical History  Diagnosis Date  . Hypertension   . Diabetes mellitus without complication ([place])   . Hyperlipidemia   . Obesity   . Arthritis     Past Surgical History  Procedure Laterality Date  . Gastric tumor removed  2010    Allergies: Review of patient's allergies indicates no known allergies.  Medications: Prior to Admission medications   Medication Sig Start Date End Date Taking? Authorizing Provider  atorvastatin (LIPITOR) 20 MG tablet Take 1 tablet (20 mg total) by mouth daily. Reported on 04/06/2015 04/06/15  Yes Kathrine Haddock, NP  blood glucose meter kit and supplies KIT Dispense based on patient and insurance preference. Use up to four times daily as directed. (FOR ICD-9 250.00, 250.01). 12/05/14  Yes Kathrine Haddock, NP  cyclobenzaprine (FLEXERIL) 10 MG tablet Take 1 tablet (10 mg total) by mouth at bedtime. 04/06/15  Yes Kathrine Haddock, NP  Insulin Glargine (LANTUS) 100 UNIT/ML Solostar Pen Inject 36 Units into the skin daily at 10 pm. 04/06/15  Yes Kathrine Haddock, NP  Insulin Glargine (TOUJEO SOLOSTAR) 300 UNIT/ML SOPN Inject 36 Units into the skin daily.    Yes Historical Provider, MD  lisinopril-hydrochlorothiazide (PRINZIDE,ZESTORETIC) 20-25 MG tablet Take 1 tablet by mouth daily. 04/06/15  Yes Kathrine Haddock, NP  metFORMIN (GLUCOPHAGE) 1000 MG tablet Take 1 tablet (1,000 mg total) by mouth 2 (two) times daily with a meal. 04/06/15  Yes Kathrine Haddock, NP  sildenafil  (VIAGRA) 50 MG tablet Take 50 mg by mouth daily as needed for erectile dysfunction.   Yes Historical Provider, MD  meloxicam (MOBIC) 15 MG tablet Take 1 tablet (15 mg total) by mouth daily. Patient not taking: Reported on 08/24/2015 05/07/15   Charline Bills Cuthriell, PA-C  sitaGLIPtin-metformin (JANUMET) 50-1000 MG tablet Take 1 tablet by mouth 2 (two) times daily with a meal. Patient not taking: Reported on 08/24/2015 12/05/14   Kathrine Haddock, NP     Family History  Problem Relation Age of Onset  . Heart disease Mother   . Hypertension Father   . Cancer Sister     stomach  . Heart disease Brother     MI  . Cancer Daughter   . Cancer Son     testicular  . ADD / ADHD Sister     Social History   Social History  . Marital Status: Married    Spouse Name: N/A  . Number of Children: N/A  . Years of Education: N/A   Social History Main Topics  . Smoking status: Never Smoker   . Smokeless tobacco: Never Used  . Alcohol Use: No  . Drug Use: No  . Sexual Activity: Not Currently   Other Topics Concern  . None   Social History Narrative     Review of Systems  Constitutional: Negative for fever and chills.  Respiratory: Negative.   Cardiovascular: Negative.   Gastrointestinal: Negative.   Genitourinary: Negative.     Vital Signs: BP 136/99 mmHg  Pulse 75  Temp(Src)  98.1 F (36.7 C) (Oral)  Resp 15  Ht 5' 7"  (1.702 m)  Wt 225 lb (102.059 kg)  BMI 35.23 kg/m2  SpO2 95%  Physical Exam  Constitutional: He appears well-developed.  Neck:  Palpable mass/adenopathy in left neck.  Skin is tight in this area.    Cardiovascular: Normal rate, regular rhythm and normal heart sounds.  Exam reveals no gallop and no friction rub.   No murmur heard. Pulmonary/Chest: Effort normal and breath sounds normal. No respiratory distress. He has no wheezes.  Abdominal: Soft. Bowel sounds are normal. He exhibits no distension.  Lymphadenopathy:    He has cervical adenopathy.    Mallampati  Score:  MD Evaluation Airway: WNL Heart: WNL Abdomen: WNL Chest/ Lungs: WNL Other Pertinent Findings: Leck neck mass ASA  Classification: 1 Mallampati/Airway Score: Two  Imaging: Ct Soft Tissue Neck W Contrast  08/17/2015  CLINICAL DATA:  LEFT neck mass, duration unspecified. EXAM: CT NECK WITH CONTRAST TECHNIQUE: Multidetector CT imaging of the neck was performed using the standard protocol following the bolus administration of intravenous contrast. CONTRAST:  74m ISOVUE-300 IOPAMIDOL (ISOVUE-300) INJECTION 61% COMPARISON:  PET scan 11/27/2008. FINDINGS: Pharynx and larynx: Extrinsic mass effect from deep lobe parotid lesion and adenopathy. No intrinsic pharyngeal or laryngeal abnormality. Salivary glands: Infiltrative deep lobe LEFT parotid lesion, up to 4.1 x 5.4 cm, involving the masticator space and parapharyngeal space, displacing the carotid sheath vessels medially. No osseous invasion. Normal RIGHT parotid gland and submandibular glands. Thyroid: Normal. Lymph nodes: Aggressive level 2, level 3, and level 5 adenopathy, on the LEFT, conglomerate nodal mass deep to the LEFT sternocleidomastoid up to 4 cm in long-axis. Adenopathy appears centrally necrotic, with likely extranodal spread. No contralateral lymph nodes of significance. Vascular: Negative. Limited intracranial: No asymmetry of the LEFT cavernous sinus. No intracranial masses are visualized. Visualized orbits: Nonvisualized Mastoids and visualized paranasal sinuses: Unremarkable. Skeleton: There is slight flaring of the LEFT stylomastoid foramen as compared to the RIGHT as seen on coronal imaging see image 80 series 6. Cranial nerve VII perineural tumor spread not excluded. Recommend MRI of the neck without and with contrast. Cervical spondylosis. Upper chest: No lung nodules. Compared with prior PET scan in [address] a hypermetabolic lesion was seen in the deep lobe of the parotid on the LEFT. IMPRESSION: Slowly progressive over years,  infiltrative, deep lobe parotid mass on the LEFT, up to 4 x 5 cm cross-section, with widespread ipsilateral bulky adenopathy. 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 Mucoepidermoid carcinoma not excluded. Tissue sampling is warranted. Slight flaring of the stylomastoid foramen on the LEFT could represent seventh cranial nerve perineural tumor spread. MRI neck without and with contrast recommended for further evaluation. PET scan could provide additional information regarding distant spread. Findings discussed with Erika at the ordering provider's office at time of interpretation. Electronically Signed   By: JStaci RighterM.D.   On: 08/17/2015 14:21    Labs:  CBC: No results for input(s): WBC, HGB, HCT, PLT in the last 8760 hours.  COAGS: No results for input(s): INR, APTT in the last 8760 hours.  BMP:  Recent Labs  12/05/14 1418 04/06/15 1318 08/17/15 1311  NA 137 142  --   K 3.9 4.0  --   CL 93* 99  --   CO2 22 26  --   GLUCOSE 350* 108*  --   BUN 13 18  --   CALCIUM 9.4 9.8  --   CREATININE 1.15 1.07 0.90  GFRNONAA 69 75  --  GFRAA 80 87  --     LIVER FUNCTION TESTS:  Recent Labs  12/05/14 1418 04/06/15 1318  BILITOT 0.3 0.4  AST 20 21  ALT 30 24  ALKPHOS 107 83  PROT 6.7 7.5  ALBUMIN 4.1 4.5    TUMOR MARKERS: No results for input(s): AFPTM, CEA, CA199, CHROMGRNA in the last 8760 hours.  Assessment and Plan:  61 yo with left parotid mass and bulky adenopathy based on CT.  Plan for US guided biopsy of left parotid or nodal tissue.  Plan for FNA and likely core biopsies.  Informed consent obtained from patient.  Plan to use moderate sedation.    Thank you for this interesting consult.  I greatly enjoyed meeting Kent [name] and look forward to participating in their care.  A copy of this report was sent to the requesting provider on this date.  Electronically Signed: Carylon Perches 08/24/2015, 10:39 AM   I spent a total of  15 Minutes   in face to face in  clinical consultation, greater than 50% of which was counseling/coordinating care for left neck biopsy.

## 2015-08-28 LAB — CYTOLOGY - NON PAP

## 2015-08-28 LAB — SURGICAL PATHOLOGY

## 2015-09-10 DIAGNOSIS — C01 Malignant neoplasm of base of tongue: Secondary | ICD-10-CM | POA: Insufficient documentation

## 2015-09-26 ENCOUNTER — Inpatient Hospital Stay: Payer: Managed Care, Other (non HMO) | Attending: Hematology and Oncology | Admitting: Hematology and Oncology

## 2015-09-26 ENCOUNTER — Other Ambulatory Visit: Payer: Self-pay | Admitting: Hematology and Oncology

## 2015-09-26 ENCOUNTER — Encounter: Payer: Self-pay | Admitting: Hematology and Oncology

## 2015-09-26 VITALS — BP 126/87 | HR 96 | Temp 98.7°F | Resp 18 | Ht 67.72 in | Wt 207.7 lb

## 2015-09-26 DIAGNOSIS — Z800 Family history of malignant neoplasm of digestive organs: Secondary | ICD-10-CM | POA: Diagnosis not present

## 2015-09-26 DIAGNOSIS — Z79899 Other long term (current) drug therapy: Secondary | ICD-10-CM | POA: Diagnosis not present

## 2015-09-26 DIAGNOSIS — C01 Malignant neoplasm of base of tongue: Secondary | ICD-10-CM | POA: Diagnosis not present

## 2015-09-26 DIAGNOSIS — Z8509 Personal history of malignant neoplasm of other digestive organs: Secondary | ICD-10-CM

## 2015-09-26 DIAGNOSIS — Z794 Long term (current) use of insulin: Secondary | ICD-10-CM

## 2015-09-26 DIAGNOSIS — Z7984 Long term (current) use of oral hypoglycemic drugs: Secondary | ICD-10-CM | POA: Diagnosis not present

## 2015-09-26 DIAGNOSIS — R590 Localized enlarged lymph nodes: Secondary | ICD-10-CM

## 2015-09-26 DIAGNOSIS — Z8043 Family history of malignant neoplasm of testis: Secondary | ICD-10-CM | POA: Diagnosis not present

## 2015-09-26 DIAGNOSIS — I10 Essential (primary) hypertension: Secondary | ICD-10-CM

## 2015-09-26 DIAGNOSIS — R630 Anorexia: Secondary | ICD-10-CM | POA: Diagnosis not present

## 2015-09-26 DIAGNOSIS — E119 Type 2 diabetes mellitus without complications: Secondary | ICD-10-CM

## 2015-09-26 DIAGNOSIS — Z9221 Personal history of antineoplastic chemotherapy: Secondary | ICD-10-CM

## 2015-09-26 DIAGNOSIS — E785 Hyperlipidemia, unspecified: Secondary | ICD-10-CM | POA: Diagnosis not present

## 2015-09-26 DIAGNOSIS — G893 Neoplasm related pain (acute) (chronic): Secondary | ICD-10-CM

## 2015-09-26 DIAGNOSIS — M129 Arthropathy, unspecified: Secondary | ICD-10-CM | POA: Diagnosis not present

## 2015-09-26 DIAGNOSIS — E669 Obesity, unspecified: Secondary | ICD-10-CM | POA: Diagnosis not present

## 2015-09-26 MED ORDER — OXYCODONE-ACETAMINOPHEN 5-325 MG PO TABS: 1.0000 | ORAL_TABLET | Freq: Four times a day (QID) | ORAL | 0 refills | Status: DC | PRN

## 2015-09-26 NOTE — Progress Notes (Signed)
Patient is her for new patient evaluation, he is here with wife and sister. Little pain in left side of neck that radiates to his ear.

## 2015-09-26 NOTE — Progress Notes (Signed)
[facility] day:  09/26/15  Chief Complaint: Kent [name] is a 60 y.o. 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 Doran Clay for assessment and management.  HPI:  The patient notes a 3 year history of a left neck "knot".  He states that it was biopsied years ago and told that it was not cancer.  Over time, the lesion has grown.  Under the insistence of his wife, he underwent recent evaluation.  He described symptoms of left otalgia (sharp shooting pain in ear pain) and a little dryness in his mouth.  Over the past 18 months, he has lost 30 pounds with an associated decrease in pants size (size 40 to 34).  He describes a good appetite and no nausea, vomiting or diarrhea.  He was seen in consultation by Dr. Margaretha Sheffield on 08/09/2015.  CT soft tissue neck on 08/17/2015 revealed a slowly progressive over years, infiltrative, deep 4.1 x 5.4 cm LEFT parotid mass with widespread ipsilateral bulky adenopathy.  There was slight flaring of the stylomastoid foramen on the LEFT which  could represent seventh cranial nerve perineural tumor spread.   Ultrasound guided biopsy on 08/24/2015 revealed metastatic neoplasm consistent with carcinoma.  Immunohistochemistry was positive for pancytokeratin (AE1/AE3/Cam5.2) and P40.  S100 revealed scattered positive cells.  Synaptophysin, CD117, and smooth muscle actin were negative. 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  He sought second opinion at [facility].  He underwent flexible laryngoscopy.  The left lateral oropharyngeal wall was displaced medially.  Ultrasound directed core needle biopsy on 09/10/2015 by Dr. Loletta Parish of ENT.  Pathology revealed squamous cell carcinoma, p16 IHC positive for high risk HPV.  PET scan on 09/17/2015 revealed left base of tongue malignancy  corresponding to asymmetric enhancement identified on the neck CT with hypermetabolic extensive conglomerate left cervical adenopathy (level II-IV) extending from the level of the angle of the mandible down inferiorly to the level of the cricoid cartilage.  There was clustered subcentimeter left level IB submandibular nodes with mild FDG activity.  There was no evidence of metastatic disease.  He was seen by Dr. Lennart Pall and Dr. Doran Clay.  Discussions were held regarding induction chemotherapy with weekly carboplatin (AUC 2), Abraxane 100 mg/m2, and cetuximab 400 mg/m2 then 250 mg/m2 for 6 weeks followed by concurrent chemoradiation with cisplatin 100 mg/m2 every 21 days x 3 cycles.  Labs on 09/17/2015 revealed a normal CBC with diff, and CMP.  Alpha gal IgE was 1.21 (< 0.35).  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  Pathologic stage was T4NxM0.  He took imatinib for 1 year.  Symptomatically, he has left neck pain.  He has lost 30 pounds in the past 1.5 years.   Past Medical History:  Diagnosis Date  . Arthritis   . Diabetes mellitus without complication ([place])   . Hyperlipidemia   . Hypertension   . Obesity     Past Surgical History:  Procedure Laterality Date  . gastric tumor removed  2010    Family History  Problem Relation Age of Onset  . Heart disease Mother   . Hypertension Father   . Cancer Sister     stomach  . Heart disease Brother  MI  . Cancer Daughter   . Cancer Son     testicular  . ADD / ADHD Sister     Social History:  reports that he has never smoked. He has never used smokeless tobacco. He reports that he does not drink alcohol or use drugs.  He smokes a pipe and cigar rarely (once every 3 months).  He has a son and daughter.  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  He has planned (and paid for) a trip  to the beach from 10/15/2015 - 10/20/2015.  He was no short term disability.  He has 10 weeks of vacation and sick leave.  The patient is accompanied by his wife, Olin Hauser, and his sister, Tonette Bihari, today.  Allergies: No Known Allergies  Current Medications: Current Outpatient Prescriptions  Medication Sig Dispense Refill  . blood glucose meter kit and supplies KIT Dispense based on patient and insurance preference. Use up to four times daily as directed. (FOR ICD-9 250.00, 250.01). 1 each 12  . cyclobenzaprine (FLEXERIL) 10 MG tablet Take 1 tablet (10 mg total) by mouth at bedtime. 30 tablet 2  . Insulin Glargine (LANTUS) 100 UNIT/ML Solostar Pen Inject 36 Units into the skin daily at 10 pm. 4 pen 3  . lisinopril-hydrochlorothiazide (PRINZIDE,ZESTORETIC) 20-25 MG tablet Take 1 tablet by mouth daily. 90 tablet 1  . metFORMIN (GLUCOPHAGE) 1000 MG tablet Take 1 tablet (1,000 mg total) by mouth 2 (two) times daily with a meal. 180 tablet 3  . sildenafil (VIAGRA) 50 MG tablet Take 50 mg by mouth daily as needed for erectile dysfunction.    Marland Kitchen atorvastatin (LIPITOR) 20 MG tablet Take 1 tablet (20 mg total) by mouth daily. Reported on 04/06/2015 (Patient not taking: Reported on 09/26/2015) 90 tablet 1  . meloxicam (MOBIC) 15 MG tablet Take 1 tablet (15 mg total) by mouth daily. (Patient not taking: Reported on 08/24/2015) 30 tablet 0   No current facility-administered medications for this visit.     Review of Systems:  GENERAL:  Feels good.  No fevers or sweats.  Weight loss of 30 pounds in the past 1.5 years. PERFORMANCE STATUS (ECOG):  1 HEENT:  Vision little blurry secondary to diabetes.  No runny nose, sore throat, mouth sores or tenderness. Lungs: No shortness of breath or cough.  No hemoptysis. Cardiac:  No chest pain, palpitations, orthopnea, or PND. GI:  Good appetite.  No nausea, vomiting, diarrhea, constipation, melena or hematochezia.  No prior colonoscopy. GU:  No urgency, frequency, dysuria, or  hematuria.  Unknown PSA. Musculoskeletal:  No back pain.  Knee aches.  No muscle tenderness. Extremities:  No pain or swelling. Skin:  No rashes or skin changes. Neuro:  No headache, numbness or weakness, balance or coordination issues. Endocrine:  Diabetes, poorly controlled (blood sugar low 500s to 600).  No thyroid issues, hot flashes or night sweats. Psych:  No mood changes, depression or anxiety. Pain:  No focal pain. Review of systems:  All other systems reviewed and found to be negative.  Physical Exam: Blood pressure 126/87, pulse 96, temperature 98.7 F (37.1 C), temperature source Tympanic, resp. rate 18, height 5' 7.72" (1.72 m), weight 207 lb 10.8 oz (94.2 kg). GENERAL:  Well developed, well nourished, gentleman sitting comfortably in the exam room in no acute distress. MENTAL STATUS:  Alert and oriented to person, place and time. HEAD:  Short gray hair.  Goatee.  Normocephalic, atraumatic, face symmetric, no Cushingoid features. EYES:  Blue eyes.  Pupils equal round and reactive to light and accomodation.  No conjunctivitis or scleral icterus. ENT:  Oropharynx clear without lesion.  Tongue normal. Mucous membranes moist.  RESPIRATORY:  Clear to auscultation without rales, wheezes or rhonchi. CARDIOVASCULAR:  Regular rate and rhythm without murmur, rub or gallop. ABDOMEN:  Soft, non-tender, with active bowel sounds, and no hepatosplenomegaly.  No masses. SKIN:  No rashes, ulcers or lesions. EXTREMITIES: No edema, no skin discoloration or tenderness.  No palpable cords. LYMPH NODES: Conglomerate left neck adenopathy 9.5 x 8.5 cm.  No palpable supraclavicular, axillary or inguinal adenopathy  NEUROLOGICAL: Unremarkable. PSYCH:  Appropriate.   No visits with results within 3 Day(s) from this visit.  Latest known visit with results is:  Hospital Outpatient Visit on 08/24/2015  Component Date Value Ref Range Status  . SURGICAL PATHOLOGY 08/28/2015    Final                    Value:Surgical Pathology CASE: [id number] PATIENT: [facility] Surgical Pathology Report     SPECIMEN SUBMITTED: A. Lymph node, left cervical, core biopsy  CLINICAL HISTORY: Left parotid lesion and bulky left neck lymphadenopathy  PRE-OPERATIVE DIAGNOSIS: Left parotid lesion with cervical lymphadenopathy  POST-OPERATIVE DIAGNOSIS: None provided.     DIAGNOSIS: A. LYMPH NODE, LEFT UPPER NECK; ULTRASOUND-GUIDED CORE BIOPSY: - METASTATIC MALIGNANT NEOPLASM CONSISTENT WITH CARCINOMA.  Comment: The neoplasm shows solid growth pattern composed of small and medium-sized cells with scant cytoplasm and distinct single nucleoli. There are scattered cells with eosinophilic cytoplasm, suggestive of squamous differentiation. The mitotic rate appears high, and there is focal necrosis. 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 The results are not specific for tissue of origin. The concurrent parotid FNA is positive for carcinoma similar to the nodal metastasis ([id number]). A malignant tumor of salivary gland origin such as carcinoma ex pleomorphic adenoma cannot be ruled out. Other types of salivary gland carcinoma would seem less likely based on the histology and IHC profile. Rare cases of NUT midline carcinoma and adamantinoma-like Ewing sarcoma involving the parotid have been reported, and are in the pathologic differential. The case was discussed with Dr. Kathyrn Sheriff on 08/28/15. In view of the planned referral to a tertiary care center,  no additional studies are requested at this time; there is adequate tissue for [facility]                          and molecular studies which can be performed by the referral center.  I note the previous history of a 19 cm gastrointestinal stromal tumor of the stomach, which was resected ([id number], 12/22/2008). The current parotid and nodal neoplasm is CD117 negative and appears to be unrelated to the previous GIST.  Flow cytometry was performed by [facility], accession no. [id number], and the results are non-contributory.  IHC slides were prepared by [facility], [place], [place], and interpreted by Dr. Dicie Beam. All controls stained appropriately. CD117 (clone [id number]) test was  developed and its performance characteristics determined by [facility]. It has not been cleared or approved by the [place] FDA. The FDA has determined that such clearance or approval is not necessary. This test is used for clinical purposes. It should not be regarded as investigational or for research.   GROSS DESCRIPTION: A. Labeled:                          left cervical lymph node Tissue fragment(s): 4 cores in formalin and 1 in RPMI media Size: 4 formalin cores 0.9-2 cm centimeters in length and in diameter 0.1 Description: tan focally red cores, 1 core in RPMI media sent for flow cytometry, remaining 4 cores wrapped in lens paper, marked blue and submitted in a mesh bag  Entirely submitted in 1-2 cassette(s).  Final Diagnosis performed by Bryan Lemma, MD.  Electronically signed 08/28/2015 6:41:42PM    The electronic signature indicates that the named Attending Pathologist has evaluated the specimen  Technical component performed at [facility], [address] [place], [place] [address] Lab: [phone] Dir: Darrick Penna. Evette Doffing, MD  Professional component performed at [facility], [facility], [place],  [place], [place] [address] Lab: [phone] Dir: Dellia Nims. Reuel Derby, MD    . CYTOLOGY - NON GYN 08/28/2015    Final                   Value:Cytology - Non PAP CASE: [id number] PATIENT: Anne [facility] Non-Gyn Cytology Report     SPECIMEN SUBMITTED: A. Parotid, left, FNA  CLINICAL HISTORY: None Provided  PRE-OPERATIVE DIAGNOSIS: None provided  POST-OPERATIVE DIAGNOSIS: None provided.     DIAGNOSIS: A. LEFT PAROTID; ULTRASOUND-GUIDED FNA: - POSITIVE FOR MALIGNANCY. - FEATURES CONSISTENT WITH CARCINOMA, SEE COMMENT.  Comment: Poorly differentiated malignant neoplasm is present, with morphologic features similar to the lymph node metastasis. Please see concurrent left cervical lymph node core biopsy report [id number] for details.  Slides reviewed: 3 Diff-Quik stained slides, 3 pap stained slides, 1 ThinPrep   GROSS DESCRIPTION: A. Site: left parotid Procedure: ultrasound FNA Cytotechnologist: Rivka Barbara Specimen(s) collected: 3 Diff Quik stained slides 3 Pap stained slides Specimen labeled left parotid :      Description: pale pink CytoLyt solution      Submitted for:                                    ThinPrep  Final Diagnosis performed by Bryan Lemma, MD.  Electronically signed 08/28/2015 6:43:09PM    The electronic signature indicates that the named Attending Pathologist has evaluated the specimen  Technical component performed at [facility], [address], [place], [place] [address] Lab: [phone] Dir: Darrick Penna. Evette Doffing, MD  Professional component performed at [facility], [facility], [place], [place], [place] [address] Lab: [phone] Dir: Dellia Nims. Rubinas, MD      Assessment:  Kent [name] is a 60 y.o. male with clinical T2N3M0 left tonsil or base of tongue squamous cell carcinoma s/p biopsy on 09/10/2015.  Tumor was p16 IHC positive (high risk HPV).  CT soft tissue neck on 08/17/2015 revealed a slowly progressive over  years, infiltrative, deep 4.1 x 5.4 cm LEFT parotid mass with widespread ipsilateral bulky adenopathy.  PET scan at [facility] on 09/17/2015 revealed left base of tongue malignancy corresponding to asymmetric enhancement identified on the neck CT with hypermetabolic extensive conglomerate left cervical adenopathy (level  II-IV) extending from the level of the angle of the mandible down inferiorly to the level of the cricoid cartilage.  There was clustered subcentimeter left level IB submandibular nodes with mild FDG activity.  There was no evidence of metastatic disease.  Alpha gal IgE was 1.21 (< 0.35) c/w IgE antibodies to galactose-alpha-1,3 galactose (oligosaccharide part on Fab portion of the cetuximab heavy chain) and suggestive of potential cetuximab reaction.  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  Pathologic stage was T4NxM0.  He took imatinib for 1 year.  Symptomatically, he has otalagia and left neck pain.  He has lost 30 pounds in the past 1.5 years.  Exam reveals conglomerate 9.5 x 8.5 cm left neck adenopathy.  Plan: 1.  Discuss diagnosis, staging, and management of squamous cell carcinoma of the head and neck.  Discuss standard chemotherapy (cisplatin 100 mg/m2 every 3 weeks x 3) with concurrent radiation.  Discuss proposed plan by [facility] with induction chemotherapy.  Discuss concern for reaction to cetuximab.  Discuss phone follow-up with oncology.  Discuss standard induction chemotherapy (TPF: Taxotere, cisplatin, continuous infusion 5FU).  Discuss potential side effects associated with high dose cisplatin (myelosuppression, nausea, vomiting, high frequency hearing loss, renal dysfunction, electrolyte wasting (potassium and magnesium)).  Discuss chemotherapy class.  Discuss chemotherapy via PIV or port-a-cath.  Discuss side effects associated with radiation discussed. 2.  Phone  follow-up with Dr Lennart Pall [phone]) or Dr Doran Clay [phone]).  Messages left with Nena Jordan and [facility]. 3.  Discuss follow-up with Kathrine Haddock, NP for better control of blood sugar given plan for Decadron with cisplatin. 4.  Preauth cisplatin 5.  Chemotherapy class for cisplatin 6.  Rx:  Oxycodone 5/acetaminophen 325 mg 1 tablet po q 6 hours prn pain; dis #40. 7.  Anticipate consult with Dr. Baruch Gouty on 10/04/2015. 8.  RTC on 10/03/2015 for MD assessment, labs (CBC with diff, CMP, Mg), and cycle #1 high dose cisplatin.   Lequita Asal, MD  09/26/2015, 10:51 AM

## 2015-09-28 ENCOUNTER — Telehealth: Payer: Self-pay | Admitting: Hematology and Oncology

## 2015-09-28 NOTE — Telephone Encounter (Signed)
She would like to talk to Dr. Mike Gip about a call she received from [facility] yesterday. Please call: [phone]

## 2015-09-28 NOTE — Telephone Encounter (Signed)
Mrs. Markus states that Dr Romie Levee office ENT has been calling her 2 days in a row to have him come back and set him up for his tonsils to be removed and states that he does not need to have chemo until after this is done and he waits a few weeks.  They want to take tonsils to confirm that is where cancer originated.  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  She would like to know thoughts of corcoran about whether to proceed with chemo or does the ENT have a good idea because she does not feel that way and if the tonsil removed is a good idea why did they not decide that while he was there at [facility] and just now making decision that this needs to be done now.  I told her I would send message to [facility] and call wife back on monday

## 2015-09-30 ENCOUNTER — Encounter: Payer: Self-pay | Admitting: Hematology and Oncology

## 2015-10-01 ENCOUNTER — Other Ambulatory Visit: Payer: Self-pay | Admitting: *Deleted

## 2015-10-01 DIAGNOSIS — C01 Malignant neoplasm of base of tongue: Secondary | ICD-10-CM

## 2015-10-01 NOTE — Telephone Encounter (Signed)
Called pt back today, corcoran wants to talk to dr Mariel Kansky and go from there. At this point she does see a good reason to have the tonsils removed. We area still trying to get in touch with Dr. Horald Chestnut med. Onc. There.  We will try again tom to get in touch but hold on going there until dr Mike Gip can get in touch with [facility].

## 2015-10-02 ENCOUNTER — Other Ambulatory Visit: Payer: Self-pay

## 2015-10-02 ENCOUNTER — Encounter: Payer: Self-pay | Admitting: Unknown Physician Specialty

## 2015-10-02 ENCOUNTER — Ambulatory Visit (INDEPENDENT_AMBULATORY_CARE_PROVIDER_SITE_OTHER): Payer: Managed Care, Other (non HMO) | Admitting: Unknown Physician Specialty

## 2015-10-02 VITALS — BP 152/106 | HR 89 | Temp 98.3°F | Ht 67.0 in | Wt 211.8 lb

## 2015-10-02 DIAGNOSIS — E1122 Type 2 diabetes mellitus with diabetic chronic kidney disease: Secondary | ICD-10-CM

## 2015-10-02 DIAGNOSIS — C01 Malignant neoplasm of base of tongue: Secondary | ICD-10-CM | POA: Diagnosis not present

## 2015-10-02 DIAGNOSIS — I10 Essential (primary) hypertension: Secondary | ICD-10-CM

## 2015-10-02 DIAGNOSIS — I129 Hypertensive chronic kidney disease with stage 1 through stage 4 chronic kidney disease, or unspecified chronic kidney disease: Secondary | ICD-10-CM

## 2015-10-02 LAB — MICROALBUMIN, URINE WAIVED
Creatinine, Urine Waived: 200 mg/dL (ref 10–300)
MICROALB, UR WAIVED: 80 mg/L — AB (ref 0–19)

## 2015-10-02 LAB — BAYER DCA HB A1C WAIVED: HB A1C: 13 % — AB (ref <7.0)

## 2015-10-02 MED ORDER — BLOOD GLUCOSE MONITOR KIT: PACK | 12 refills | Status: DC

## 2015-10-02 NOTE — Patient Instructions (Signed)
Cisplatin injection What is this medicine? CISPLATIN (SIS pla tin) is a chemotherapy drug. It targets fast dividing cells, like cancer cells, and causes these cells to die. This medicine is used to treat many types of cancer like bladder, ovarian, and testicular cancers. This medicine may be used for other purposes; ask your health care provider or pharmacist if you have questions. What should I tell my health care provider before I take this medicine? They need to know if you have any of these conditions: -blood disorders -hearing problems -kidney disease -recent or ongoing radiation therapy -an unusual or allergic reaction to cisplatin, carboplatin, other chemotherapy, other medicines, foods, dyes, or preservatives -pregnant or trying to get pregnant -breast-feeding How should I use this medicine? This drug is given as an infusion into a vein. It is administered in a hospital or clinic by a specially trained health care professional. Talk to your pediatrician regarding the use of this medicine in children. Special care may be needed. Overdosage: If you think you have taken too much of this medicine contact a poison control center or emergency room at once. NOTE: This medicine is only for you. Do not share this medicine with others. What if I miss a dose? It is important not to miss a dose. Call your doctor or health care professional if you are unable to keep an appointment. What may interact with this medicine? -dofetilide -foscarnet -medicines for seizures -medicines to increase blood counts like filgrastim, pegfilgrastim, sargramostim -probenecid -pyridoxine used with altretamine -rituximab -some antibiotics like amikacin, gentamicin, neomycin, polymyxin B, streptomycin, tobramycin -sulfinpyrazone -vaccines -zalcitabine Talk to your doctor or health care professional before taking any of these medicines: -acetaminophen -aspirin -ibuprofen -ketoprofen -naproxen This list may  not describe all possible interactions. Give your health care provider a list of all the medicines, herbs, non-prescription drugs, or dietary supplements you use. Also tell them if you smoke, drink alcohol, or use illegal drugs. Some items may interact with your medicine. What should I watch for while using this medicine? Your condition will be monitored carefully while you are receiving this medicine. You will need important blood work done while you are taking this medicine. This drug may make you feel generally unwell. This is not uncommon, as chemotherapy can affect healthy cells as well as cancer cells. Report any side effects. Continue your course of treatment even though you feel ill unless your doctor tells you to stop. In some cases, you may be given additional medicines to help with side effects. Follow all directions for their use. Call your doctor or health care professional for advice if you get a fever, chills or sore throat, or other symptoms of a cold or flu. Do not treat yourself. This drug decreases your body's ability to fight infections. Try to avoid being around people who are sick. This medicine may increase your risk to bruise or bleed. Call your doctor or health care professional if you notice any unusual bleeding. Be careful brushing and flossing your teeth or using a toothpick because you may get an infection or bleed more easily. If you have any dental work done, tell your dentist you are receiving this medicine. Avoid taking products that contain aspirin, acetaminophen, ibuprofen, naproxen, or ketoprofen unless instructed by your doctor. These medicines may hide a fever. Do not become pregnant while taking this medicine. Women should inform their doctor if they wish to become pregnant or think they might be pregnant. There is a potential for serious side effects to   an unborn child. Talk to your health care professional or pharmacist for more information. Do not breast-feed an  infant while taking this medicine. Drink fluids as directed while you are taking this medicine. This will help protect your kidneys. Call your doctor or health care professional if you get diarrhea. Do not treat yourself. What side effects may I notice from receiving this medicine? Side effects that you should report to your doctor or health care professional as soon as possible: -allergic reactions like skin rash, itching or hives, swelling of the face, lips, or tongue -signs of infection - fever or chills, cough, sore throat, pain or difficulty passing urine -signs of decreased platelets or bleeding - bruising, pinpoint red spots on the skin, black, tarry stools, nosebleeds -signs of decreased red blood cells - unusually weak or tired, fainting spells, lightheadedness -breathing problems -changes in hearing -gout pain -low blood counts - This drug may decrease the number of white blood cells, red blood cells and platelets. You may be at increased risk for infections and bleeding. -nausea and vomiting -pain, swelling, redness or irritation at the injection site -pain, tingling, numbness in the hands or feet -problems with balance, movement -trouble passing urine or change in the amount of urine Side effects that usually do not require medical attention (report to your doctor or health care professional if they continue or are bothersome): -changes in vision -loss of appetite -metallic taste in the mouth or changes in taste This list may not describe all possible side effects. Call your doctor for medical advice about side effects. You may report side effects to FDA at 1-800-FDA-1088. Where should I keep my medicine? This drug is given in a hospital or clinic and will not be stored at home. NOTE: This sheet is a summary. It may not cover all possible information. If you have questions about this medicine, talk to your doctor, pharmacist, or health care provider.    2016, Elsevier/[employer]  Standard. (2007-05-25 14:40:54) Docetaxel injection What is this medicine? DOCETAXEL (doe se TAX el) is a chemotherapy drug. It targets fast dividing cells, like cancer cells, and causes these cells to die. This medicine is used to treat many types of cancers like breast cancer, certain stomach cancers, head and neck cancer, lung cancer, and prostate cancer. This medicine may be used for other purposes; ask your health care provider or pharmacist if you have questions. What should I tell my health care provider before I take this medicine? They need to know if you have any of these conditions: -infection (especially a virus infection such as chickenpox, cold sores, or herpes) -liver disease -low blood counts, like low white cell, platelet, or red cell counts -an unusual or allergic reaction to docetaxel, polysorbate 80, other chemotherapy agents, other medicines, foods, dyes, or preservatives -pregnant or trying to get pregnant -breast-feeding How should I use this medicine? This drug is given as an infusion into a vein. It is administered in a hospital or clinic by a specially trained health care professional. Talk to your pediatrician regarding the use of this medicine in children. Special care may be needed. Overdosage: If you think you have taken too much of this medicine contact a poison control center or emergency room at once. NOTE: This medicine is only for you. Do not share this medicine with others. What if I miss a dose? It is important not to miss your dose. Call your doctor or health care professional if you are unable to keep an appointment.  What may interact with this medicine? -cyclosporine -erythromycin -ketoconazole -medicines to increase blood counts like filgrastim, pegfilgrastim, sargramostim -vaccines Talk to your doctor or health care professional before taking any of these medicines: -acetaminophen -aspirin -ibuprofen -ketoprofen -naproxen This list may not  describe all possible interactions. Give your health care provider a list of all the medicines, herbs, non-prescription drugs, or dietary supplements you use. Also tell them if you smoke, drink alcohol, or use illegal drugs. Some items may interact with your medicine. What should I watch for while using this medicine? Your condition will be monitored carefully while you are receiving this medicine. You will need important blood work done while you are taking this medicine. This drug may make you feel generally unwell. This is not uncommon, as chemotherapy can affect healthy cells as well as cancer cells. Report any side effects. Continue your course of treatment even though you feel ill unless your doctor tells you to stop. In some cases, you may be given additional medicines to help with side effects. Follow all directions for their use. Call your doctor or health care professional for advice if you get a fever, chills or sore throat, or other symptoms of a cold or flu. Do not treat yourself. This drug decreases your body's ability to fight infections. Try to avoid being around people who are sick. This medicine may increase your risk to bruise or bleed. Call your doctor or health care professional if you notice any unusual bleeding. This medicine may contain alcohol in the product. You may get drowsy or dizzy. Do not drive, use machinery, or do anything that needs mental alertness until you know how this medicine affects you. Do not stand or sit up quickly, especially if you are an older patient. This reduces the risk of dizzy or fainting spells. Avoid alcoholic drinks. Do not become pregnant while taking this medicine. Women should inform their doctor if they wish to become pregnant or think they might be pregnant. There is a potential for serious side effects to an unborn child. Talk to your health care professional or pharmacist for more information. Do not breast-feed an infant while taking this  medicine. What side effects may I notice from receiving this medicine? Side effects that you should report to your doctor or health care professional as soon as possible: -allergic reactions like skin rash, itching or hives, swelling of the face, lips, or tongue -low blood counts - This drug may decrease the number of white blood cells, red blood cells and platelets. You may be at increased risk for infections and bleeding. -signs of infection - fever or chills, cough, sore throat, pain or difficulty passing urine -signs of decreased platelets or bleeding - bruising, pinpoint red spots on the skin, black, tarry stools, nosebleeds -signs of decreased red blood cells - unusually weak or tired, fainting spells, lightheadedness -breathing problems -fast or irregular heartbeat -low blood pressure -mouth sores -nausea and vomiting -pain, swelling, redness or irritation at the injection site -pain, tingling, numbness in the hands or feet -swelling of the ankle, feet, hands -weight gain Side effects that usually do not require medical attention (report to your prescriber or health care professional if they continue or are bothersome): -bone pain -complete hair loss including hair on your head, underarms, pubic hair, eyebrows, and eyelashes -diarrhea -excessive tearing -changes in the color of fingernails -loosening of the fingernails -nausea -muscle pain -red flush to skin -sweating -weak or tired This list may not describe all possible side  effects. Call your doctor for medical advice about side effects. You may report side effects to FDA at 1-800-FDA-1088. Where should I keep my medicine? This drug is given in a hospital or clinic and will not be stored at home. NOTE: This sheet is a summary. It may not cover all possible information. If you have questions about this medicine, talk to your doctor, pharmacist, or health care provider.    2016, Elsevier/Gold Standard. (2014-03-06  16:04:57) Fluorouracil, 5-FU injection What is this medicine? FLUOROURACIL, 5-FU (flure oh YOOR a sil) is a chemotherapy drug. It slows the growth of cancer cells. This medicine is used to treat many types of cancer like breast cancer, colon or rectal cancer, pancreatic cancer, and stomach cancer. This medicine may be used for other purposes; ask your health care provider or pharmacist if you have questions. What should I tell my health care provider before I take this medicine? They need to know if you have any of these conditions: -blood disorders -dihydropyrimidine dehydrogenase (DPD) deficiency -infection (especially a virus infection such as chickenpox, cold sores, or herpes) -kidney disease -liver disease -malnourished, poor nutrition -recent or ongoing radiation therapy -an unusual or allergic reaction to fluorouracil, other chemotherapy, other medicines, foods, dyes, or preservatives -pregnant or trying to get pregnant -breast-feeding How should I use this medicine? This drug is given as an infusion or injection into a vein. It is administered in a hospital or clinic by a specially trained health care professional. Talk to your pediatrician regarding the use of this medicine in children. Special care may be needed. Overdosage: If you think you have taken too much of this medicine contact a poison control center or emergency room at once. NOTE: This medicine is only for you. Do not share this medicine with others. What if I miss a dose? It is important not to miss your dose. Call your doctor or health care professional if you are unable to keep an appointment. What may interact with this medicine? -allopurinol -cimetidine -dapsone -digoxin -hydroxyurea -leucovorin -levamisole -medicines for seizures like ethotoin, fosphenytoin, phenytoin -medicines to increase blood counts like filgrastim, pegfilgrastim, sargramostim -medicines that treat or prevent blood clots like warfarin,  enoxaparin, and dalteparin -methotrexate -metronidazole -pyrimethamine -some other chemotherapy drugs like busulfan, cisplatin, estramustine, vinblastine -trimethoprim -trimetrexate -vaccines Talk to your doctor or health care professional before taking any of these medicines: -acetaminophen -aspirin -ibuprofen -ketoprofen -naproxen This list may not describe all possible interactions. Give your health care provider a list of all the medicines, herbs, non-prescription drugs, or dietary supplements you use. Also tell them if you smoke, drink alcohol, or use illegal drugs. Some items may interact with your medicine. What should I watch for while using this medicine? Visit your doctor for checks on your progress. This drug may make you feel generally unwell. This is not uncommon, as chemotherapy can affect healthy cells as well as cancer cells. Report any side effects. Continue your course of treatment even though you feel ill unless your doctor tells you to stop. In some cases, you may be given additional medicines to help with side effects. Follow all directions for their use. Call your doctor or health care professional for advice if you get a fever, chills or sore throat, or other symptoms of a cold or flu. Do not treat yourself. This drug decreases your body's ability to fight infections. Try to avoid being around people who are sick. This medicine may increase your risk to bruise or bleed. Call your doctor  or health care professional if you notice any unusual bleeding. Be careful brushing and flossing your teeth or using a toothpick because you may get an infection or bleed more easily. If you have any dental work done, tell your dentist you are receiving this medicine. Avoid taking products that contain aspirin, acetaminophen, ibuprofen, naproxen, or ketoprofen unless instructed by your doctor. These medicines may hide a fever. Do not become pregnant while taking this medicine. Women should  inform their doctor if they wish to become pregnant or think they might be pregnant. There is a potential for serious side effects to an unborn child. Talk to your health care professional or pharmacist for more information. Do not breast-feed an infant while taking this medicine. Men should inform their doctor if they wish to father a child. This medicine may lower sperm counts. Do not treat diarrhea with over the counter products. Contact your doctor if you have diarrhea that lasts more than 2 days or if it is severe and watery. This medicine can make you more sensitive to the sun. Keep out of the sun. If you cannot avoid being in the sun, wear protective clothing and use sunscreen. Do not use sun lamps or tanning beds/booths. What side effects may I notice from receiving this medicine? Side effects that you should report to your doctor or health care professional as soon as possible: -allergic reactions like skin rash, itching or hives, swelling of the face, lips, or tongue -low blood counts - this medicine may decrease the number of white blood cells, red blood cells and platelets. You may be at increased risk for infections and bleeding. -signs of infection - fever or chills, cough, sore throat, pain or difficulty passing urine -signs of decreased platelets or bleeding - bruising, pinpoint red spots on the skin, black, tarry stools, blood in the urine -signs of decreased red blood cells - unusually weak or tired, fainting spells, lightheadedness -breathing problems -changes in vision -chest pain -mouth sores -nausea and vomiting -pain, swelling, redness at site where injected -pain, tingling, numbness in the hands or feet -redness, swelling, or sores on hands or feet -stomach pain -unusual bleeding Side effects that usually do not require medical attention (report to your doctor or health care professional if they continue or are bothersome): -changes in finger or toe  nails -diarrhea -dry or itchy skin -hair loss -headache -loss of appetite -sensitivity of eyes to the light -stomach upset -unusually teary eyes This list may not describe all possible side effects. Call your doctor for medical advice about side effects. You may report side effects to FDA at 1-800-FDA-1088. Where should I keep my medicine? This drug is given in a hospital or clinic and will not be stored at home. NOTE: This sheet is a summary. It may not cover all possible information. If you have questions about this medicine, talk to your doctor, pharmacist, or health care provider.    2016, [employer] Standard. (2007-06-23 13:53:16)

## 2015-10-02 NOTE — Patient Instructions (Signed)
Base your long acting insulin on your fasting (usually in the morning) blood sugar.  Increase long acting (daily insulin) 2 units if fasting blood sugar is greater than 120.  Decrease by 2 units if fasting blood sugar is less than 95.    

## 2015-10-02 NOTE — Assessment & Plan Note (Signed)
Poor control with Hgb A1C of 13.  Base your long acting insulin on your fasting (usually in the morning) blood sugar.  Increase long acting (daily insulin) 2 units if fasting blood sugar is greater than 120.  Decrease by 2 units if fasting blood sugar is less than 95.

## 2015-10-02 NOTE — Assessment & Plan Note (Signed)
Not to goal.  Did not take medications today.  Recheck in 2 weeks

## 2015-10-02 NOTE — Progress Notes (Signed)
BP (!) 152/106 (BP Location: Left Arm, Cuff Size: Large)   Pulse 89   Temp 98.3 F (36.8 C)   Ht 5' 7"  (1.702 m)   Wt 211 lb 12.8 oz (96.1 kg)   SpO2 98%   BMI 33.17 kg/m    Subjective:    Patient ID: Kent [name], male    DOB: Nov 16, 1955, 60 y.o.   MRN: [id number]  HPI: Kent [name] is a 60 y.o. male  Chief Complaint  Patient presents with  . Diabetes  . Hypertension  . Hyperlipidemia   Pt is lost to f/u here.  He was recently diagnosed with cancer in his tonsils and referred here from Dr. Mike Gip.  Last visit his DM was at goal but hypertensive.    Diabetes: Using medications without difficulties.  Taking 36 units of Lantus at night plus Metformin No hypoglycemic episodes No hyperglycemic episodes Feet problems Blood Sugars averaging: 119 this AM eye exam within last year Last Hgb A1C: 6.4 in 04/06/15  Hypertension  Taking his medication but not this morning Average home BPs  Not checking   Using medication without problems or lightheadedness No chest pain with exertion or shortness of breath No Edema  Elevated Cholesterol Using medications without problems No Muscle aches  Diet: regular Exercise: just work    Relevant past medical, surgical, family and social history reviewed and updated as indicated. Interim medical history since our last visit reviewed. Allergies and medications reviewed and updated.  Review of Systems  Per HPI unless specifically indicated above     Objective:    BP (!) 152/106 (BP Location: Left Arm, Cuff Size: Large)   Pulse 89   Temp 98.3 F (36.8 C)   Ht 5' 7"  (1.702 m)   Wt 211 lb 12.8 oz (96.1 kg)   SpO2 98%   BMI 33.17 kg/m   Wt Readings from Last 3 Encounters:  10/02/15 211 lb 12.8 oz (96.1 kg)  09/26/15 207 lb 10.8 oz (94.2 kg)  08/24/15 225 lb (102.1 kg)    Physical Exam  Constitutional: He is oriented to person, place, and time. He appears well-developed and well-nourished. No distress.  HENT:  Head:  Normocephalic and atraumatic.  Eyes: Conjunctivae and lids are normal. Right eye exhibits no discharge. Left eye exhibits no discharge. No scleral icterus.  Neck: Normal range of motion. Neck supple. No JVD present. Carotid bruit is not present.  Cardiovascular: Normal rate, regular rhythm and normal heart sounds.   Pulmonary/Chest: Effort normal and breath sounds normal. No respiratory distress.  Abdominal: Normal appearance. There is no splenomegaly or hepatomegaly.  Musculoskeletal: Normal range of motion.  Neurological: He is alert and oriented to person, place, and time.  Skin: Skin is warm, dry and intact. No rash noted. No pallor.  Psychiatric: He has a normal mood and affect. His behavior is normal. Judgment and thought content normal.   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  Did not take medications today.  Recheck in 2 weeks      Hypertensive CKD (chronic kidney disease)   Poorly controlled type 2 diabetes mellitus ([place]) - Primary    Poor control with Hgb A1C of 13.  Base your long acting insulin on your fasting (usually in the morning) blood sugar.  Increase long acting (daily insulin) 2 units if fasting  blood sugar is greater than 120.  Decrease by 2 units if fasting blood sugar is less than 95.         Relevant Medications   blood glucose meter kit and supplies KIT    Other Visit Diagnoses   None.     Ordered a new Glucometer as he feels like the one he has doesn't work  Follow up plan: Return in about 2 weeks (around 10/16/2015).

## 2015-10-03 ENCOUNTER — Encounter (INDEPENDENT_AMBULATORY_CARE_PROVIDER_SITE_OTHER): Payer: Self-pay

## 2015-10-03 ENCOUNTER — Encounter: Payer: Self-pay | Admitting: Radiation Oncology

## 2015-10-03 ENCOUNTER — Ambulatory Visit
Admission: RE | Admit: 2015-10-03 | Discharge: 2015-10-03 | Disposition: A | Discharge status: Home / Self Care | Payer: Managed Care, Other (non HMO) | Source: Ambulatory Visit | Attending: Radiation Oncology | Admitting: Radiation Oncology

## 2015-10-03 VITALS — BP 149/98 | HR 72 | Temp 97.7°F | Resp 20 | Wt 208.2 lb

## 2015-10-03 DIAGNOSIS — I10 Essential (primary) hypertension: Secondary | ICD-10-CM | POA: Diagnosis not present

## 2015-10-03 DIAGNOSIS — Z809 Family history of malignant neoplasm, unspecified: Secondary | ICD-10-CM | POA: Diagnosis not present

## 2015-10-03 DIAGNOSIS — C01 Malignant neoplasm of base of tongue: Secondary | ICD-10-CM | POA: Diagnosis present

## 2015-10-03 DIAGNOSIS — Z510 Encounter for antineoplastic radiation therapy: Secondary | ICD-10-CM | POA: Diagnosis not present

## 2015-10-03 DIAGNOSIS — E785 Hyperlipidemia, unspecified: Secondary | ICD-10-CM | POA: Insufficient documentation

## 2015-10-03 DIAGNOSIS — Z79899 Other long term (current) drug therapy: Secondary | ICD-10-CM | POA: Diagnosis not present

## 2015-10-03 DIAGNOSIS — E669 Obesity, unspecified: Secondary | ICD-10-CM | POA: Diagnosis not present

## 2015-10-03 DIAGNOSIS — Z85028 Personal history of other malignant neoplasm of stomach: Secondary | ICD-10-CM | POA: Insufficient documentation

## 2015-10-03 DIAGNOSIS — M129 Arthropathy, unspecified: Secondary | ICD-10-CM | POA: Insufficient documentation

## 2015-10-03 DIAGNOSIS — E119 Type 2 diabetes mellitus without complications: Secondary | ICD-10-CM | POA: Insufficient documentation

## 2015-10-03 DIAGNOSIS — Z794 Long term (current) use of insulin: Secondary | ICD-10-CM | POA: Diagnosis not present

## 2015-10-03 DIAGNOSIS — C07 Malignant neoplasm of parotid gland: Secondary | ICD-10-CM

## 2015-10-03 NOTE — Progress Notes (Signed)
Except an outstanding is perfect of Radiation Oncology NEW PATIENT EVALUATION  Name: Kent [name]  MRN: [id number]  Date:   10/03/2015     DOB: 11/28/1955   This 60 y.o. 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  REFERRING PHYSICIAN: Kathrine Haddock, NP  CHIEF COMPLAINT:  Chief Complaint  Patient presents with  . Cancer    Pt is here for initial consultation of parotid cancer.      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).   PREVIOUS INVESTIGATIONS:  PET CT scan CT scans reviewed Pathology report reviewed Clinical notes reviewed  HPI: Patient is a 60 year old male who is had a when he describes as a left neck not for 3 years. He states he was biopsied years ago and told that it was benign. This is persistently enlarged over time and he has developed shooting pains in his ear and some mild dysphagia. He also has a 30 pound weight loss over the past year and a half. He was seen by ENT CT scan of the neck performed June 2017 showed a 4.1 x 5.4 cm left parotid mass with widespread ipsilateral bulky adenopathy. He underwent biopsy on 08/24/2015 showing metastatic carcinoma consistent with squamous cell carcinoma with basal Earnie Larsson features. Tumor was P 16 IHC positive for high risk HPV. PET scan performed 09/17/2015 showed left base of tongue hypermetabolic activity also with significant hypermetabolic activity in the region of the left parotid and entire cervical chain. No evidence of distant metastatic disease was noted. 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 Medical oncology has recommended concurrent weekly cisplatin with radiation therapy. Other discussions include possible induction chemotherapy. Patient is seen today is doing fairly well. He specifically denies head and neck pain or dysphagia. Does  also complain of occasional shooting pain in his left ear.  PLANNED TREATMENT REGIMEN: Concurrent chemoradiation using I MRT  PAST MEDICAL HISTORY:  has a past medical history of Arthritis; Diabetes mellitus without complication ([place]); Hyperlipidemia; Hypertension; Obesity; and Stomach cancer ([place]).    PAST SURGICAL HISTORY:  Past Surgical History:  Procedure Laterality Date  . gastric tumor removed  2010    FAMILY HISTORY: family history includes ADD / ADHD in his sister; Cancer in his daughter, sister, and son; Heart disease in his brother and mother; Hypertension in his father.  SOCIAL HISTORY:  reports that he has never smoked. He has never used smokeless tobacco. He reports that he does not drink alcohol or use drugs.  ALLERGIES: Review of patient's allergies indicates no known allergies.  MEDICATIONS:  Current Outpatient Prescriptions  Medication Sig Dispense Refill  . blood glucose meter kit and supplies KIT Dispense based on patient and insurance preference. Use up to four times daily as directed. (FOR ICD-9 250.00, 250.01). 1 each 12  . cyclobenzaprine (FLEXERIL) 10 MG tablet Take 1 tablet (10 mg total) by mouth at bedtime. 30 tablet 2  . Insulin Glargine (LANTUS) 100 UNIT/ML Solostar Pen Inject 36 Units into the skin daily at 10 pm. 4 pen 3  . lisinopril-hydrochlorothiazide (PRINZIDE,ZESTORETIC) 20-25 MG tablet Take 1 tablet by mouth daily. 90 tablet 1  . meloxicam (MOBIC) 15 MG tablet Take 1 tablet (15 mg total) by mouth daily. 30 tablet 0  . metFORMIN (GLUCOPHAGE) 1000 MG tablet Take 1 tablet (1,000 mg total)  by mouth 2 (two) times daily with a meal. 180 tablet 3  . oxyCODONE-acetaminophen (PERCOCET/ROXICET) 5-325 MG tablet Take 1 tablet by mouth every 6 (six) hours as needed for severe pain. 40 tablet 0  . sildenafil (VIAGRA) 50 MG tablet Take 50 mg by mouth daily as needed for erectile dysfunction.    Marland Kitchen UNIFINE PENTIPS 31G X 6 MM MISC 4 (four) times daily.    Marland Kitchen  atorvastatin (LIPITOR) 20 MG tablet Take 1 tablet (20 mg total) by mouth daily. Reported on 04/06/2015 (Patient not taking: Reported on 09/26/2015) 90 tablet 1   No current facility-administered medications for this encounter.     ECOG PERFORMANCE STATUS:  1 - Symptomatic but completely ambulatory  REVIEW OF SYSTEMS: Except for the slight head and neck pain and weight loss Patient denies any weight loss, fatigue, weakness, fever, chills or night sweats. Patient denies any loss of vision, blurred vision. Patient denies any ringing  of the ears or hearing loss. No irregular heartbeat. Patient denies heart murmur or history of fainting. Patient denies any chest pain or pain radiating to her upper extremities. Patient denies any shortness of breath, difficulty breathing at night, cough or hemoptysis. Patient denies any swelling in the lower legs. Patient denies any nausea vomiting, vomiting of blood, or coffee ground material in the vomitus. Patient denies any stomach pain. Patient states has had normal bowel movements no significant constipation or diarrhea. Patient denies any dysuria, hematuria or significant nocturia. Patient denies any problems walking, swelling in the joints or loss of balance. Patient denies any skin changes, loss of hair or loss of weight. Patient denies any excessive worrying or anxiety or significant depression. Patient denies any problems with insomnia. Patient denies excessive thirst, polyuria, polydipsia. Patient denies any swollen glands, patient denies easy bruising or easy bleeding. Patient denies any recent infections, allergies or URI. Patient "s visual fields have not changed significantly in recent time.    PHYSICAL EXAM: BP (!) 149/98   Pulse 72   Temp 97.7 F (36.5 C)   Resp 20   Wt 208 lb 3.6 oz (94.4 kg)   BMI 32.61 kg/m  Well developed slightly obese male in NAD. Has a large matted conglomerate of nodes in his left parotid and left cervical chain. Oral cavity  shows patient is mostly edentulous has his lower incisors present in a good state of repair. Indirect mirror examination is difficult secondary to gag reflex. Tonsillar regions appear normal. Right neck is clear. Well-developed well-nourished patient in NAD. HEENT reveals PERLA, EOMI, discs not visualized.  Oral cavity is clear. No oral mucosal lesions are identified. Neck is clear without evidence of cervical or supraclavicular adenopathy. Lungs are clear to A&P. Cardiac examination is essentially unremarkable with regular rate and rhythm without murmur rub or thrill. Abdomen is benign with no organomegaly or masses noted. Motor sensory and DTR levels are equal and symmetric in the upper and lower extremities. Cranial nerves II through XII are grossly intact. Proprioception is intact. No peripheral adenopathy or edema is identified. No motor or sensory levels are noted. Crude visual fields are within normal range.  LABORATORY DATA: Pathology reports reviewed    RADIOLOGY RESULTS: CT scan PET CT scans reviewed   IMPRESSION: Stage IV B squamous cell carcinoma of the base of tongue in 61 year old male  PLAN: I will personally discussed the case with medical oncology. If the patient will not have induction chemotherapy would go right ahead to cis-platinum given on a weekly basis  along with I MRT radiation therapy. Would like to take this area to 7000 cGy to the areas of hypermetabolic activity in his head and neck field and treat the remainder of his nodes to 5400 cGy using dose painting technique of I MRT. We will also do an MRI CT fusion study to delineate areas of tumor involvement. May have to use adaptive therapy if we see significant response and reduction in his bulky left neck adenopathy. I have personally ordered CT simulation and scheduled that for early next week. Risks and benefits of radiation including xerostomia alteration of taste fatigue alteration of blood counts skin reaction all were  discussed in detail with the patient. He seems to comprehend my treatment plan well.  I would like to take this opportunity to thank you for allowing me to participate in the care of your patient.Armstead Peaks., MD

## 2015-10-03 NOTE — Addendum Note (Signed)
Encounter addended by: Noreene Filbert, MD on: 10/03/2015 11:53 AM<BR>    Actions taken: LOS modified, Follow-up modified

## 2015-10-04 ENCOUNTER — Inpatient Hospital Stay: Payer: Managed Care, Other (non HMO) | Attending: Hematology and Oncology

## 2015-10-04 DIAGNOSIS — Z808 Family history of malignant neoplasm of other organs or systems: Secondary | ICD-10-CM | POA: Insufficient documentation

## 2015-10-04 DIAGNOSIS — Z800 Family history of malignant neoplasm of digestive organs: Secondary | ICD-10-CM | POA: Insufficient documentation

## 2015-10-04 DIAGNOSIS — Z85028 Personal history of other malignant neoplasm of stomach: Secondary | ICD-10-CM | POA: Insufficient documentation

## 2015-10-04 DIAGNOSIS — M129 Arthropathy, unspecified: Secondary | ICD-10-CM | POA: Insufficient documentation

## 2015-10-04 DIAGNOSIS — Z794 Long term (current) use of insulin: Secondary | ICD-10-CM | POA: Insufficient documentation

## 2015-10-04 DIAGNOSIS — E785 Hyperlipidemia, unspecified: Secondary | ICD-10-CM | POA: Insufficient documentation

## 2015-10-04 DIAGNOSIS — E669 Obesity, unspecified: Secondary | ICD-10-CM | POA: Insufficient documentation

## 2015-10-04 DIAGNOSIS — Z7984 Long term (current) use of oral hypoglycemic drugs: Secondary | ICD-10-CM | POA: Insufficient documentation

## 2015-10-04 DIAGNOSIS — E871 Hypo-osmolality and hyponatremia: Secondary | ICD-10-CM | POA: Insufficient documentation

## 2015-10-04 DIAGNOSIS — C01 Malignant neoplasm of base of tongue: Secondary | ICD-10-CM | POA: Insufficient documentation

## 2015-10-04 DIAGNOSIS — H9209 Otalgia, unspecified ear: Secondary | ICD-10-CM | POA: Insufficient documentation

## 2015-10-04 DIAGNOSIS — E119 Type 2 diabetes mellitus without complications: Secondary | ICD-10-CM | POA: Insufficient documentation

## 2015-10-04 DIAGNOSIS — I10 Essential (primary) hypertension: Secondary | ICD-10-CM | POA: Insufficient documentation

## 2015-10-04 DIAGNOSIS — Z5111 Encounter for antineoplastic chemotherapy: Secondary | ICD-10-CM | POA: Insufficient documentation

## 2015-10-04 DIAGNOSIS — Z79899 Other long term (current) drug therapy: Secondary | ICD-10-CM | POA: Insufficient documentation

## 2015-10-04 DIAGNOSIS — M542 Cervicalgia: Secondary | ICD-10-CM | POA: Insufficient documentation

## 2015-10-04 NOTE — Telephone Encounter (Signed)
Called wife today and let her know that we are still trying to get in touch with Kent [name] but no success.  One day this week when I called the clinic they were going to give him a paper message because the paging system was down but I have his pager and clinic # but no success yet.  I did ask her if chrystal spoke to her and she said yes.  That he did not feel that it was necessary and it slows down the process of starting chemo because of healing after surgery.  The wife already knew that part and she agrees that surgery is not needed.  Dr. Mike Gip feels the same but did want to speak to med. onc Arthor Captain to make sure we were not missing pieces of the puzzle

## 2015-10-09 ENCOUNTER — Ambulatory Visit
Admission: RE | Admit: 2015-10-09 | Discharge: 2015-10-09 | Disposition: A | Discharge status: Home / Self Care | Payer: Managed Care, Other (non HMO) | Source: Ambulatory Visit | Attending: Radiation Oncology | Admitting: Radiation Oncology

## 2015-10-09 DIAGNOSIS — C01 Malignant neoplasm of base of tongue: Secondary | ICD-10-CM | POA: Diagnosis not present

## 2015-10-10 NOTE — Telephone Encounter (Signed)
Called wife and told her that Dr. Mike Gip spoke to dr Mariel Kansky and several of the trials he told her about she read over and one trial it was proved that induction chemo was not better than chemo with radiation.  One of the other chemo he rec: she could not find any trials that even tested that treatment regimen.  She also rec: like dr Baruch Gouty that he not have tonsils removed.  It would cause longer wait to heal in order to start chemo and you never know complications that can come after surgery. Wife was always agreeable to the chemo and radiation because she could not see if they already have a dx why does tonsils need to be removed.

## 2015-10-11 ENCOUNTER — Other Ambulatory Visit: Payer: Self-pay | Admitting: Unknown Physician Specialty

## 2015-10-15 DIAGNOSIS — C01 Malignant neoplasm of base of tongue: Secondary | ICD-10-CM | POA: Diagnosis not present

## 2015-10-17 ENCOUNTER — Ambulatory Visit: Payer: Managed Care, Other (non HMO) | Admitting: Unknown Physician Specialty

## 2015-10-17 DIAGNOSIS — C01 Malignant neoplasm of base of tongue: Secondary | ICD-10-CM | POA: Diagnosis not present

## 2015-10-18 ENCOUNTER — Ambulatory Visit
Admission: RE | Admit: 2015-10-18 | Discharge: 2015-10-18 | Disposition: A | Discharge status: Home / Self Care | Payer: Managed Care, Other (non HMO) | Source: Ambulatory Visit | Attending: Radiation Oncology | Admitting: Radiation Oncology

## 2015-10-19 ENCOUNTER — Other Ambulatory Visit: Payer: Self-pay

## 2015-10-19 DIAGNOSIS — C01 Malignant neoplasm of base of tongue: Secondary | ICD-10-CM

## 2015-10-20 NOTE — Progress Notes (Signed)
[facility] day:  10/22/15  Chief Complaint: Kent [name] is a 60 y.o. male with T2N3M0 squamous cell carcinoma of the base of tongue/left tonsil who is seen for assessment prior to initiation of concurrent chemotherapy and radiation.  HPI:  The patient was last seen in the medical oncology clinic on 09/26/2015 for initial consultation.  We discussed induction chemotherapy followed by concurrent chemotherapy ad radiation versus concurrent chemotherapy and radiation.  He was not felt to be a candidate for cetuximab secondary secondary to + alpha gal IgE.  He was seen by radiation oncology.  He attended the chemotherapy class.  Symptomatically, he feels a little bit anxious. He took his blood pressure pill an hour ago. He will be receiving his chemotherapy via peripheral IV. He notes a pain in his left neck associated with his adenopathy. He's been taking a lot of Tylenol.  He requests a refill of his oxycodone.  He has continued to work as he did not want to use any vacation time.   Past Medical History:  Diagnosis Date  . Arthritis   . Diabetes mellitus without complication ([place])   . Hyperlipidemia   . Hypertension   . Obesity   . Stomach cancer [facility])    had tumor removed    Past Surgical History:  Procedure Laterality Date  . gastric tumor removed  2010    Family History  Problem Relation Age of Onset  . Heart disease Mother   . Hypertension Father   . Cancer Sister     stomach  . Heart disease Brother     MI  . Cancer Daughter   . Cancer Son     testicular  . ADD / ADHD Sister     Social History:  reports that he has never smoked. He has never used smokeless tobacco. He reports that he does not drink alcohol or use drugs.  He smokes a pipe and cigar rarely (once every 3 months).  He has a son and daughter.  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  He has planned (and paid for) a trip to the beach from  10/15/2015 - 10/20/2015.  He was no short term disability.  He has 10 weeks of vacation and sick leave.  The patient is accompanied by his wife, Kent [name], today.  Allergies: No Known Allergies  Current Medications: Current Outpatient Prescriptions  Medication Sig Dispense Refill  . atorvastatin (LIPITOR) 20 MG tablet Take 1 tablet (20 mg total) by mouth daily. Reported on 04/06/2015 90 tablet 1  . blood glucose meter kit and supplies KIT Dispense based on patient and insurance preference. Use up to four times daily as directed. (FOR ICD-9 250.00, 250.01). 1 each 12  . cyclobenzaprine (FLEXERIL) 10 MG tablet Take 1 tablet (10 mg total) by mouth at bedtime. 30 tablet 2  . Insulin Glargine (LANTUS) 100 UNIT/ML Solostar Pen Inject 36 Units into the skin daily at 10 pm. 4 pen 3  . lisinopril-hydrochlorothiazide (PRINZIDE,ZESTORETIC) 20-25 MG tablet Take 1 tablet by mouth daily. 90 tablet 1  . meloxicam (MOBIC) 15 MG tablet Take 1 tablet (15 mg total) by mouth daily. 30 tablet 0  . metFORMIN (GLUCOPHAGE) 1000 MG tablet Take 1 tablet (1,000 mg total) by mouth 2 (two) times daily with a meal. 180 tablet 3  . oxyCODONE-acetaminophen (PERCOCET/ROXICET) 5-325 MG tablet Take 1 tablet by mouth every 6 (six) hours as needed for severe  pain. 40 tablet 0  . sildenafil (REVATIO) 20 MG tablet 1-3 Tablet daily as needed ORAL 10 tablet 0  . sildenafil (VIAGRA) 50 MG tablet Take 50 mg by mouth daily as needed for erectile dysfunction.    Marland Kitchen UNIFINE PENTIPS 31G X 6 MM MISC 4 (four) times daily.     No current facility-administered medications for this visit.     Review of Systems:  GENERAL:  Feels good.  No fevers or sweats.  Weight gain of 11 pounds. PERFORMANCE STATUS (ECOG):  1 HEENT:  Vision little blurry secondary to diabetes.  No runny nose, sore throat, mouth sores or tenderness. Lungs: No shortness of breath or cough.  No hemoptysis. Cardiac:  No chest pain, palpitations, orthopnea, or PND.  Took BP pill 1  hour ago. GI:  Good appetite.  No nausea, vomiting, diarrhea, constipation, melena or hematochezia.  No prior colonoscopy. GU:  No urgency, frequency, dysuria, or hematuria.  Musculoskeletal:  No back pain.  Knee aches.  No muscle tenderness. Extremities:  No pain or swelling. Skin:  No rashes or skin changes. Neuro:  No headache, numbness or weakness, balance or coordination issues. Endocrine:  Diabetes (blood sugar better controlled).  No thyroid issues, hot flashes or night sweats. Psych:  No mood changes, depression or anxiety. Pain:  Pain associated with adenopathy. Review of systems:  All other systems reviewed and found to be negative.  Physical Exam: Blood pressure (!) 159/95, pulse 69, temperature (!) 96.2 F (35.7 C), temperature source Tympanic, resp. rate 18, weight 218 lb 0.6 oz (98.9 kg). GENERAL:  Well developed, well nourished, gentleman sitting comfortably in the exam room in no acute distress. MENTAL STATUS:  Alert and oriented to person, place and time. HEAD:  Short gray hair.  Goatee.  Normocephalic, atraumatic, face symmetric, no Cushingoid features. EYES:  Blue eyes.  Pupils equal round and reactive to light and accomodation.  No conjunctivitis or scleral icterus. ENT:  Oropharynx clear without lesion.  Tongue normal. Mucous membranes moist.  RESPIRATORY:  Clear to auscultation without rales, wheezes or rhonchi. CARDIOVASCULAR:  Regular rate and rhythm without murmur, rub or gallop. ABDOMEN:  Soft, non-tender, with active bowel sounds, and no hepatosplenomegaly.  No masses. SKIN:  No rashes, ulcers or lesions. EXTREMITIES: No edema, no skin discoloration or tenderness.  No palpable cords. LYMPH NODES: Conglomerate left neck adenopathy 9 cm.  No palpable supraclavicular, axillary or inguinal adenopathy  NEUROLOGICAL: Unremarkable. PSYCH:  Appropriate.   Appointment on 10/22/2015  Component Date Value Ref Range Status  . WBC 10/22/2015 8.0  3.8 - 10.6 K/uL Final   . RBC 10/22/2015 4.50  4.40 - 5.90 MIL/uL Final  . Hemoglobin 10/22/2015 13.5  13.0 - 18.0 g/dL Final  . HCT 10/22/2015 38.4* 40.0 - 52.0 % Final  . MCV 10/22/2015 85.2  80.0 - 100.0 fL Final  . MCH 10/22/2015 29.9  26.0 - 34.0 pg Final  . MCHC 10/22/2015 35.1  32.0 - 36.0 g/dL Final  . RDW 10/22/2015 14.2  11.5 - 14.5 % Final  . Platelets 10/22/2015 218  150 - 440 K/uL Final  . Neutrophils Relative % 10/22/2015 67  % Final  . Neutro Abs 10/22/2015 5.4  1.4 - 6.5 K/uL Final  . Lymphocytes Relative 10/22/2015 21  % Final  . Lymphs Abs 10/22/2015 1.7  1.0 - 3.6 K/uL Final  . Monocytes Relative 10/22/2015 8  % Final  . Monocytes Absolute 10/22/2015 0.6  0.2 - 1.0 K/uL Final  . Eosinophils  Relative 10/22/2015 3  % Final  . Eosinophils Absolute 10/22/2015 0.2  0 - 0.7 K/uL Final  . Basophils Relative 10/22/2015 1  % Final  . Basophils Absolute 10/22/2015 0.1  0 - 0.1 K/uL Final  . Sodium 10/22/2015 137  135 - 145 mmol/L Final  . Potassium 10/22/2015 3.6  3.5 - 5.1 mmol/L Final  . Chloride 10/22/2015 103  101 - 111 mmol/L Final  . CO2 10/22/2015 27  22 - 32 mmol/L Final  . Glucose, Bld 10/22/2015 193* 65 - 99 mg/dL Final  . BUN 10/22/2015 14  6 - 20 mg/dL Final  . Creatinine, Ser 10/22/2015 0.85  0.61 - 1.24 mg/dL Final  . Calcium 10/22/2015 PENDING  8.9 - 10.3 mg/dL Incomplete  . Total Protein 10/22/2015 6.7  6.5 - 8.1 g/dL Final  . Albumin 10/22/2015 3.8  3.5 - 5.0 g/dL Final  . AST 10/22/2015 14* 15 - 41 U/L Final  . ALT 10/22/2015 13* 17 - 63 U/L Final  . Alkaline Phosphatase 10/22/2015 75  38 - 126 U/L Final  . Total Bilirubin 10/22/2015 0.6  0.3 - 1.2 mg/dL Final  . GFR calc non Af Amer 10/22/2015 >60  >60 mL/min Final  . GFR calc Af Amer 10/22/2015 >60  >60 mL/min Final   Comment: (NOTE) The eGFR has been calculated using the CKD EPI equation. This calculation has not been validated in all clinical situations. eGFR's persistently <60 mL/min signify possible Chronic  Kidney Disease.   . Anion gap 10/22/2015 7  5 - 15 Final  . Magnesium 10/22/2015 1.7  1.7 - 2.4 mg/dL Final    Assessment:  Kent [name] is a 60 y.o. male with clinical T2N3M0 left tonsil or base of tongue squamous cell carcinoma s/p biopsy on 09/10/2015.  Tumor was p16 IHC positive (high risk HPV).  CT soft tissue neck on 08/17/2015 revealed a slowly progressive over years, infiltrative, deep 4.1 x 5.4 cm LEFT parotid mass with widespread ipsilateral bulky adenopathy.  PET scan at [facility] on 09/17/2015 revealed left base of tongue malignancy corresponding to asymmetric enhancement identified on the neck CT with hypermetabolic extensive conglomerate left cervical adenopathy (level II-IV) extending from the level of the angle of the mandible down inferiorly to the level of the cricoid cartilage.  There was clustered subcentimeter left level IB submandibular nodes with mild FDG activity.  There was no evidence of metastatic disease.  Alpha gal IgE was 1.21 (< 0.35) c/w IgE antibodies to galactose-alpha-1,3 galactose (oligosaccharide part on Fab portion of the cetuximab heavy chain) and suggestive of potential cetuximab reaction.  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  Pathologic stage was T4NxM0.  He took imatinib for 1 year.  Symptomatically, he has otalagia and left neck pain.  Exam reveals conglomerate 9 cm left neck adenopathy.  Plan: 1.  Labs today:  CBC with diff, CMP, Mg. 2.  Discuss chemotherapy plan with 3 cycles of cisplatin with radiation.  Side effects re-reviewed.  Discussed hydration with chemotherapy.  Discuss nausea medications.  Discuss Decadron post chemotherapy to prevent delayed nausea and vomiting.  Discuss likely elevation of blood sugar with Decadron.  Patient to contact PCP for elevated blood sugars per prior plan.  Discuss use of ondansetron after 48 hours  if needed.  Discuss use of Ativan for anxiety. Patient is not to drive or operate heavy machinery when  taking Ativan. Discuss use of Compazine. Discuss importance of good hydration.  Discuss dehydration can cause nausea.  Patient consented to treatment. 3.  Cycle #1 cisplatin today 4.  Rx:  ondansetron, Compazine, Decadron. 5.  Rx:  Percocet 5/325 mg 1 tablet po q 6 hours prn pain dis: #40. 6.  Nurse to contact patient on Friday to assess any symptoms prior to weekend. 7.  RTC in 1 week for MD assessment and labs (CBC with diff, BMP, Mg)   Lequita Asal, MD  10/22/2015, 9:12 AM

## 2015-10-21 ENCOUNTER — Other Ambulatory Visit: Payer: Self-pay | Admitting: Hematology and Oncology

## 2015-10-21 DIAGNOSIS — C01 Malignant neoplasm of base of tongue: Secondary | ICD-10-CM

## 2015-10-22 ENCOUNTER — Ambulatory Visit
Admission: RE | Admit: 2015-10-22 | Discharge: 2015-10-22 | Disposition: A | Discharge status: Home / Self Care | Payer: Managed Care, Other (non HMO) | Source: Ambulatory Visit | Attending: Radiation Oncology | Admitting: Radiation Oncology

## 2015-10-22 ENCOUNTER — Encounter: Payer: Self-pay | Admitting: Hematology and Oncology

## 2015-10-22 ENCOUNTER — Inpatient Hospital Stay: Payer: Managed Care, Other (non HMO)

## 2015-10-22 ENCOUNTER — Other Ambulatory Visit: Payer: Self-pay | Admitting: *Deleted

## 2015-10-22 ENCOUNTER — Inpatient Hospital Stay (HOSPITAL_BASED_OUTPATIENT_CLINIC_OR_DEPARTMENT_OTHER): Payer: Managed Care, Other (non HMO) | Admitting: Hematology and Oncology

## 2015-10-22 ENCOUNTER — Encounter (INDEPENDENT_AMBULATORY_CARE_PROVIDER_SITE_OTHER): Payer: Self-pay

## 2015-10-22 VITALS — BP 159/95 | HR 69 | Temp 96.2°F | Resp 18 | Wt 218.0 lb

## 2015-10-22 VITALS — BP 148/91 | HR 65

## 2015-10-22 DIAGNOSIS — M129 Arthropathy, unspecified: Secondary | ICD-10-CM | POA: Diagnosis not present

## 2015-10-22 DIAGNOSIS — Z800 Family history of malignant neoplasm of digestive organs: Secondary | ICD-10-CM

## 2015-10-22 DIAGNOSIS — Z5111 Encounter for antineoplastic chemotherapy: Secondary | ICD-10-CM | POA: Diagnosis not present

## 2015-10-22 DIAGNOSIS — C01 Malignant neoplasm of base of tongue: Secondary | ICD-10-CM

## 2015-10-22 DIAGNOSIS — H9209 Otalgia, unspecified ear: Secondary | ICD-10-CM | POA: Diagnosis not present

## 2015-10-22 DIAGNOSIS — E669 Obesity, unspecified: Secondary | ICD-10-CM

## 2015-10-22 DIAGNOSIS — I10 Essential (primary) hypertension: Secondary | ICD-10-CM | POA: Diagnosis not present

## 2015-10-22 DIAGNOSIS — E785 Hyperlipidemia, unspecified: Secondary | ICD-10-CM | POA: Diagnosis not present

## 2015-10-22 DIAGNOSIS — G893 Neoplasm related pain (acute) (chronic): Secondary | ICD-10-CM

## 2015-10-22 DIAGNOSIS — E119 Type 2 diabetes mellitus without complications: Secondary | ICD-10-CM | POA: Diagnosis not present

## 2015-10-22 DIAGNOSIS — Z79899 Other long term (current) drug therapy: Secondary | ICD-10-CM

## 2015-10-22 DIAGNOSIS — Z85028 Personal history of other malignant neoplasm of stomach: Secondary | ICD-10-CM

## 2015-10-22 DIAGNOSIS — Z794 Long term (current) use of insulin: Secondary | ICD-10-CM

## 2015-10-22 DIAGNOSIS — M542 Cervicalgia: Secondary | ICD-10-CM | POA: Diagnosis not present

## 2015-10-22 DIAGNOSIS — E871 Hypo-osmolality and hyponatremia: Secondary | ICD-10-CM | POA: Diagnosis not present

## 2015-10-22 DIAGNOSIS — Z7984 Long term (current) use of oral hypoglycemic drugs: Secondary | ICD-10-CM

## 2015-10-22 DIAGNOSIS — Z808 Family history of malignant neoplasm of other organs or systems: Secondary | ICD-10-CM | POA: Diagnosis not present

## 2015-10-22 LAB — CBC WITH DIFFERENTIAL/PLATELET
Basophils Absolute: 0.1 10*3/uL (ref 0–0.1)
Basophils Relative: 1 %
Eosinophils Absolute: 0.2 10*3/uL (ref 0–0.7)
Eosinophils Relative: 3 %
HCT: 38.4 % — ABNORMAL LOW (ref 40.0–52.0)
Hemoglobin: 13.5 g/dL (ref 13.0–18.0)
Lymphocytes Relative: 21 %
Lymphs Abs: 1.7 10*3/uL (ref 1.0–3.6)
MCH: 29.9 pg (ref 26.0–34.0)
MCHC: 35.1 g/dL (ref 32.0–36.0)
MCV: 85.2 fL (ref 80.0–100.0)
Monocytes Absolute: 0.6 10*3/uL (ref 0.2–1.0)
Monocytes Relative: 8 %
Neutro Abs: 5.4 10*3/uL (ref 1.4–6.5)
Neutrophils Relative %: 67 %
Platelets: 218 10*3/uL (ref 150–440)
RBC: 4.5 MIL/uL (ref 4.40–5.90)
RDW: 14.2 % (ref 11.5–14.5)
WBC: 8 10*3/uL (ref 3.8–10.6)

## 2015-10-22 LAB — COMPREHENSIVE METABOLIC PANEL
ALT: 13 U/L — ABNORMAL LOW (ref 17–63)
AST: 14 U/L — ABNORMAL LOW (ref 15–41)
Albumin: 3.8 g/dL (ref 3.5–5.0)
Alkaline Phosphatase: 75 U/L (ref 38–126)
Anion gap: 7 (ref 5–15)
BUN: 14 mg/dL (ref 6–20)
CO2: 27 mmol/L (ref 22–32)
Calcium: 8.6 mg/dL — ABNORMAL LOW (ref 8.9–10.3)
Chloride: 103 mmol/L (ref 101–111)
Creatinine, Ser: 0.85 mg/dL (ref 0.61–1.24)
GFR calc Af Amer: >60 mL/min (ref >60)
GFR calc non Af Amer: >60 mL/min (ref >60)
Glucose, Bld: 193 mg/dL — ABNORMAL HIGH (ref 65–99)
Potassium: 3.6 mmol/L (ref 3.5–5.1)
Sodium: 137 mmol/L (ref 135–145)
Total Bilirubin: 0.6 mg/dL (ref 0.3–1.2)
Total Protein: 6.7 g/dL (ref 6.5–8.1)

## 2015-10-22 LAB — MAGNESIUM: Magnesium: 1.7 mg/dL (ref 1.7–2.4)

## 2015-10-22 MED ORDER — PALONOSETRON HCL INJECTION 0.25 MG/5ML
0.2500 mg | Freq: Once | INTRAVENOUS | Status: AC
  Administered 2015-10-22: 0.25 mg via INTRAVENOUS
  Filled 2015-10-22: qty 5

## 2015-10-22 MED ORDER — LORAZEPAM 0.5 MG PO TABS: 0.5000 mg | ORAL_TABLET | Freq: Four times a day (QID) | ORAL | 0 refills | Status: DC | PRN

## 2015-10-22 MED ORDER — ONDANSETRON HCL 8 MG PO TABS: 8.0000 mg | ORAL_TABLET | Freq: Two times a day (BID) | ORAL | 1 refills | Status: DC | PRN

## 2015-10-22 MED ORDER — SODIUM CHLORIDE 0.9 % IV SOLN
100.0000 mg/m2 | Freq: Once | INTRAVENOUS | Status: AC
  Administered 2015-10-22: 212 mg via INTRAVENOUS
  Filled 2015-10-22: qty 200

## 2015-10-22 MED ORDER — SODIUM CHLORIDE 0.9 % IV SOLN
INTRAVENOUS | Status: DC
  Administered 2015-10-22: 10:00:00 via INTRAVENOUS
  Filled 2015-10-22: qty 1000

## 2015-10-22 MED ORDER — PROCHLORPERAZINE MALEATE 10 MG PO TABS: 10.0000 mg | ORAL_TABLET | Freq: Four times a day (QID) | ORAL | 1 refills | Status: DC | PRN

## 2015-10-22 MED ORDER — OXYCODONE-ACETAMINOPHEN 5-325 MG PO TABS: 1.0000 | ORAL_TABLET | Freq: Four times a day (QID) | ORAL | 0 refills | Status: DC | PRN

## 2015-10-22 MED ORDER — POTASSIUM CHLORIDE 2 MEQ/ML IV SOLN
Freq: Once | INTRAVENOUS | Status: AC
  Administered 2015-10-22: 11:00:00 via INTRAVENOUS
  Filled 2015-10-22: qty 1000

## 2015-10-22 MED ORDER — DEXAMETHASONE 4 MG PO TABS: ORAL_TABLET | ORAL | 1 refills | Status: DC

## 2015-10-22 MED ORDER — SODIUM CHLORIDE 0.9 % IV SOLN
Freq: Once | INTRAVENOUS | Status: AC
  Administered 2015-10-22: 13:00:00 via INTRAVENOUS
  Filled 2015-10-22: qty 5

## 2015-10-22 NOTE — Progress Notes (Signed)
Patient is here for follow up, needs refill on oxycodone.

## 2015-10-23 ENCOUNTER — Ambulatory Visit
Admission: RE | Admit: 2015-10-23 | Discharge: 2015-10-23 | Disposition: A | Discharge status: Home / Self Care | Payer: Managed Care, Other (non HMO) | Source: Ambulatory Visit | Attending: Radiation Oncology | Admitting: Radiation Oncology

## 2015-10-23 DIAGNOSIS — C01 Malignant neoplasm of base of tongue: Secondary | ICD-10-CM | POA: Diagnosis not present

## 2015-10-24 ENCOUNTER — Ambulatory Visit
Admission: RE | Admit: 2015-10-24 | Discharge: 2015-10-24 | Disposition: A | Discharge status: Home / Self Care | Payer: Managed Care, Other (non HMO) | Source: Ambulatory Visit | Attending: Radiation Oncology | Admitting: Radiation Oncology

## 2015-10-24 DIAGNOSIS — C01 Malignant neoplasm of base of tongue: Secondary | ICD-10-CM | POA: Diagnosis not present

## 2015-10-25 ENCOUNTER — Ambulatory Visit
Admission: RE | Admit: 2015-10-25 | Discharge: 2015-10-25 | Disposition: A | Discharge status: Home / Self Care | Payer: Managed Care, Other (non HMO) | Source: Ambulatory Visit | Attending: Radiation Oncology | Admitting: Radiation Oncology

## 2015-10-25 DIAGNOSIS — C01 Malignant neoplasm of base of tongue: Secondary | ICD-10-CM | POA: Diagnosis not present

## 2015-10-25 NOTE — Progress Notes (Unsigned)
PSN met with patient and wife yesterday.  Patient currently unable to work due to his treatment schedule.  PSN will assist patient with some monthly expenses through the [facility].  PSN also informed patient about the financial hardship program through [facility] and how to apply.

## 2015-10-26 ENCOUNTER — Ambulatory Visit
Admission: RE | Admit: 2015-10-26 | Discharge: 2015-10-26 | Disposition: A | Discharge status: Home / Self Care | Payer: Managed Care, Other (non HMO) | Source: Ambulatory Visit | Attending: Radiation Oncology | Admitting: Radiation Oncology

## 2015-10-26 DIAGNOSIS — C01 Malignant neoplasm of base of tongue: Secondary | ICD-10-CM | POA: Diagnosis not present

## 2015-10-29 ENCOUNTER — Telehealth: Payer: Self-pay | Admitting: *Deleted

## 2015-10-29 ENCOUNTER — Inpatient Hospital Stay: Payer: Managed Care, Other (non HMO)

## 2015-10-29 ENCOUNTER — Ambulatory Visit
Admission: RE | Admit: 2015-10-29 | Discharge: 2015-10-29 | Disposition: A | Discharge status: Home / Self Care | Payer: Managed Care, Other (non HMO) | Source: Ambulatory Visit | Attending: Radiation Oncology | Admitting: Radiation Oncology

## 2015-10-29 ENCOUNTER — Other Ambulatory Visit: Payer: Self-pay | Admitting: *Deleted

## 2015-10-29 ENCOUNTER — Encounter: Payer: Self-pay | Admitting: Hematology and Oncology

## 2015-10-29 ENCOUNTER — Inpatient Hospital Stay (HOSPITAL_BASED_OUTPATIENT_CLINIC_OR_DEPARTMENT_OTHER): Payer: Managed Care, Other (non HMO) | Admitting: Hematology and Oncology

## 2015-10-29 VITALS — BP 131/90 | HR 78 | Temp 96.1°F | Resp 18 | Wt 210.9 lb

## 2015-10-29 DIAGNOSIS — H9209 Otalgia, unspecified ear: Secondary | ICD-10-CM

## 2015-10-29 DIAGNOSIS — I10 Essential (primary) hypertension: Secondary | ICD-10-CM

## 2015-10-29 DIAGNOSIS — E119 Type 2 diabetes mellitus without complications: Secondary | ICD-10-CM

## 2015-10-29 DIAGNOSIS — C01 Malignant neoplasm of base of tongue: Secondary | ICD-10-CM | POA: Diagnosis not present

## 2015-10-29 DIAGNOSIS — Z808 Family history of malignant neoplasm of other organs or systems: Secondary | ICD-10-CM

## 2015-10-29 DIAGNOSIS — M129 Arthropathy, unspecified: Secondary | ICD-10-CM

## 2015-10-29 DIAGNOSIS — M542 Cervicalgia: Secondary | ICD-10-CM | POA: Diagnosis not present

## 2015-10-29 DIAGNOSIS — E669 Obesity, unspecified: Secondary | ICD-10-CM

## 2015-10-29 DIAGNOSIS — E871 Hypo-osmolality and hyponatremia: Secondary | ICD-10-CM

## 2015-10-29 DIAGNOSIS — Z800 Family history of malignant neoplasm of digestive organs: Secondary | ICD-10-CM

## 2015-10-29 DIAGNOSIS — Z794 Long term (current) use of insulin: Secondary | ICD-10-CM

## 2015-10-29 DIAGNOSIS — E785 Hyperlipidemia, unspecified: Secondary | ICD-10-CM

## 2015-10-29 DIAGNOSIS — Z7984 Long term (current) use of oral hypoglycemic drugs: Secondary | ICD-10-CM

## 2015-10-29 DIAGNOSIS — Z79899 Other long term (current) drug therapy: Secondary | ICD-10-CM

## 2015-10-29 DIAGNOSIS — Z85028 Personal history of other malignant neoplasm of stomach: Secondary | ICD-10-CM

## 2015-10-29 DIAGNOSIS — H9312 Tinnitus, left ear: Secondary | ICD-10-CM

## 2015-10-29 DIAGNOSIS — R110 Nausea: Secondary | ICD-10-CM

## 2015-10-29 LAB — CBC WITH DIFFERENTIAL/PLATELET
Basophils Absolute: 0 10*3/uL (ref 0–0.1)
Basophils Relative: 0 %
Eosinophils Absolute: 0.1 10*3/uL (ref 0–0.7)
Eosinophils Relative: 1 %
HCT: 44.2 % (ref 40.0–52.0)
Hemoglobin: 15.5 g/dL (ref 13.0–18.0)
Lymphocytes Relative: 11 %
Lymphs Abs: 1.3 10*3/uL (ref 1.0–3.6)
MCH: 29.4 pg (ref 26.0–34.0)
MCHC: 35.1 g/dL (ref 32.0–36.0)
MCV: 83.9 fL (ref 80.0–100.0)
Monocytes Absolute: 1 10*3/uL (ref 0.2–1.0)
Monocytes Relative: 9 %
Neutro Abs: 9.3 10*3/uL — ABNORMAL HIGH (ref 1.4–6.5)
Neutrophils Relative %: 79 %
Platelets: 247 10*3/uL (ref 150–440)
RBC: 5.27 MIL/uL (ref 4.40–5.90)
RDW: 14.1 % (ref 11.5–14.5)
WBC: 11.9 10*3/uL — ABNORMAL HIGH (ref 3.8–10.6)

## 2015-10-29 LAB — BASIC METABOLIC PANEL
Anion gap: 9 (ref 5–15)
BUN: 18 mg/dL (ref 6–20)
CO2: 30 mmol/L (ref 22–32)
Calcium: 8.9 mg/dL (ref 8.9–10.3)
Chloride: 91 mmol/L — ABNORMAL LOW (ref 101–111)
Creatinine, Ser: 1.01 mg/dL (ref 0.61–1.24)
GFR calc Af Amer: >60 mL/min (ref >60)
GFR calc non Af Amer: >60 mL/min (ref >60)
Glucose, Bld: 268 mg/dL — ABNORMAL HIGH (ref 65–99)
Potassium: 3.7 mmol/L (ref 3.5–5.1)
Sodium: 130 mmol/L — ABNORMAL LOW (ref 135–145)

## 2015-10-29 LAB — MAGNESIUM: Magnesium: 1.7 mg/dL (ref 1.7–2.4)

## 2015-10-29 NOTE — Telephone Encounter (Signed)
Asking to speak with Judeen Hammans regarding Anatole's work. Please call [phone]

## 2015-10-29 NOTE — Progress Notes (Signed)
Patient is here for follow up, he is doing fairly well. He mentions some lightheadedness and some nausea. I took orthostatic bp and pulse.   Sitting was 131/90 pulse 78  Standing 130/84 pulse 85

## 2015-10-29 NOTE — Telephone Encounter (Signed)
Called wife and she told me that pt called his boss and said that he is going back to work and his boss sent them to HR person with [employer] [place] and was told that md should read over pt's job description and let md determine if pt can come back to work and what duties he can do.  I did not see a note of duties with paper work. 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 He wears no protective clothing and no protective masks. After speaking to dr Mike Gip about his work duties she feels that pt should be out of work until he completes chemo and radiation.  Wife told this over the phone.

## 2015-10-29 NOTE — Progress Notes (Signed)
[facility] day:  10/29/15  Chief Complaint: Kent [name] is a 60 y.o. 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  HPI:  The patient was last seen in the medical oncology clinic on 10/22/2015.  At that time, he began cycle #1 cisplatin with radiation.  He tolerated his chemotherapy well. He noted some nausea. He took the "brown nausea pill" every 6 hours.  He did not take Ativan.  His sugar was up initially with Decadron in the 400 range.  He has been drinking a lot of plain water.  He feels that the neck mass is smaller.  He denies any mouth sores or tenderness.  He is receiving radiation daily.   Past Medical History:  Diagnosis Date  . Arthritis   . Diabetes mellitus without complication ([place])   . Hyperlipidemia   . Hypertension   . Obesity   . Stomach cancer [facility])    had tumor removed    Past Surgical History:  Procedure Laterality Date  . gastric tumor removed  2010    Family History  Problem Relation Age of Onset  . Heart disease Mother   . Hypertension Father   . Cancer Sister     stomach  . Heart disease Brother     MI  . Cancer Daughter   . Cancer Son     testicular  . ADD / ADHD Sister     Social History:  reports that he has never smoked. He has never used smokeless tobacco. He reports that he does not drink alcohol or use drugs.  He smokes a pipe and cigar rarely (once every 3 months).  He has a son and daughter.  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  He has planned (and paid for) a trip to the beach from 10/15/2015 - 10/20/2015.  He was no short term disability.  He has 10 weeks of vacation and sick leave.  The patient is accompanied by his wife, Olin Hauser, today.  Allergies: No Known Allergies  Current Medications: Current Outpatient Prescriptions  Medication Sig Dispense Refill   . atorvastatin (LIPITOR) 20 MG tablet Take 1 tablet (20 mg total) by mouth daily. Reported on 04/06/2015 90 tablet 1  . blood glucose meter kit and supplies KIT Dispense based on patient and insurance preference. Use up to four times daily as directed. (FOR ICD-9 250.00, 250.01). 1 each 12  . cyclobenzaprine (FLEXERIL) 10 MG tablet Take 1 tablet (10 mg total) by mouth at bedtime. 30 tablet 2  . dexamethasone (DECADRON) 4 MG tablet Take 2 tablets by mouth once a day on the day after chemotherapy and then take 2 tablets two times a day for 2 days. Take with food. 30 tablet 1  . Insulin Glargine (LANTUS) 100 UNIT/ML Solostar Pen Inject 36 Units into the skin daily at 10 pm. 4 pen 3  . lisinopril-hydrochlorothiazide (PRINZIDE,ZESTORETIC) 20-25 MG tablet Take 1 tablet by mouth daily. 90 tablet 1  . LORazepam (ATIVAN) 0.5 MG tablet Take 1 tablet (0.5 mg total) by mouth every 6 (six) hours as needed (Nausea or vomiting). 30 tablet 0  . meloxicam (MOBIC) 15 MG tablet Take 1 tablet (15 mg total) by mouth daily. 30 tablet 0  . metFORMIN (GLUCOPHAGE) 1000 MG tablet Take 1 tablet (1,000 mg total) by mouth 2 (two)  times daily with a meal. 180 tablet 3  . ondansetron (ZOFRAN) 8 MG tablet Take 1 tablet (8 mg total) by mouth 2 (two) times daily as needed. Start on the third day after chemotherapy. 30 tablet 1  . oxyCODONE-acetaminophen (PERCOCET/ROXICET) 5-325 MG tablet Take 1 tablet by mouth every 6 (six) hours as needed for severe pain. 40 tablet 0  . prochlorperazine (COMPAZINE) 10 MG tablet Take 1 tablet (10 mg total) by mouth every 6 (six) hours as needed (Nausea or vomiting). 30 tablet 1  . sildenafil (REVATIO) 20 MG tablet 1-3 Tablet daily as needed ORAL 10 tablet 0  . sildenafil (VIAGRA) 50 MG tablet Take 50 mg by mouth daily as needed for erectile dysfunction.    Marland Kitchen UNIFINE PENTIPS 31G X 6 MM MISC 4 (four) times daily.     No current facility-administered medications for this visit.     Review of Systems:   GENERAL:  Feels "pretty good".  No fevers or sweats.  Weight down 8 pounds. PERFORMANCE STATUS (ECOG):  1 HEENT:  Vision little blurry secondary to diabetes.  Hearing not normal at baseline (tested at work in past).  Left sided tinnitus.  No runny nose, sore throat, mouth sores or tenderness. Lungs: No shortness of breath or cough.  No hemoptysis. Cardiac:  No chest pain, palpitations, orthopnea, or PND.  Took BP pill 1 hour ago. GI:  Appetite 50%.  No nausea, vomiting, diarrhea, constipation, melena or hematochezia.  No prior colonoscopy. GU:  No urgency, frequency, dysuria, or hematuria.  Musculoskeletal:  No back pain.  Knee aches.  No muscle tenderness. Extremities:  No pain or swelling. Skin:  No rashes or skin changes. Neuro:  No headache, numbness or weakness, balance or coordination issues. Endocrine:  Diabetes (blood sugar better controlled).  No thyroid issues, hot flashes or night sweats. Psych:  No mood changes, depression or anxiety. Pain:  Pain associated with adenopathy. Review of systems:  All other systems reviewed and found to be negative.  Physical Exam: Blood pressure 131/90, pulse 78, temperature (!) 96.1 F (35.6 C), temperature source Tympanic, resp. rate 18, weight 210 lb 13.9 oz (95.7 kg), SpO2 99 %. GENERAL:  Well developed, well nourished, gentleman sitting comfortably in the exam room in no acute distress. MENTAL STATUS:  Alert and oriented to person, place and time. HEAD:  Short gray hair.  Goatee.  Normocephalic, atraumatic, face symmetric, no Cushingoid features. EYES:  Blue eyes.  Pupils equal round and reactive to light and accomodation.  No conjunctivitis or scleral icterus. ENT:  Oropharynx clear without lesion.  Tongue normal. Mucous membranes moist.  RESPIRATORY:  Clear to auscultation without rales, wheezes or rhonchi. CARDIOVASCULAR:  Regular rate and rhythm without murmur, rub or gallop. ABDOMEN:  Soft, non-tender, with active bowel sounds, and no  hepatosplenomegaly.  No masses. SKIN:  No rashes, ulcers or lesions. EXTREMITIES: No edema, no skin discoloration or tenderness.  No palpable cords. LYMPH NODES: Conglomerate left neck adenopathy 6.5 x 6 cm (improved).  No palpable supraclavicular, axillary or inguinal adenopathy  NEUROLOGICAL: Unremarkable. PSYCH:  Appropriate.   Appointment on 10/29/2015  Component Date Value Ref Range Status  . WBC 10/29/2015 11.9* 3.8 - 10.6 K/uL Final  . RBC 10/29/2015 5.27  4.40 - 5.90 MIL/uL Final  . Hemoglobin 10/29/2015 15.5  13.0 - 18.0 g/dL Final  . HCT 10/29/2015 44.2  40.0 - 52.0 % Final  . MCV 10/29/2015 83.9  80.0 - 100.0 fL Final  . MCH 10/29/2015 29.4  26.0 - 34.0 pg Final  . MCHC 10/29/2015 35.1  32.0 - 36.0 g/dL Final  . RDW 10/29/2015 14.1  11.5 - 14.5 % Final  . Platelets 10/29/2015 247  150 - 440 K/uL Final  . Neutrophils Relative % 10/29/2015 79  % Final  . Neutro Abs 10/29/2015 9.3* 1.4 - 6.5 K/uL Final  . Lymphocytes Relative 10/29/2015 11  % Final  . Lymphs Abs 10/29/2015 1.3  1.0 - 3.6 K/uL Final  . Monocytes Relative 10/29/2015 9  % Final  . Monocytes Absolute 10/29/2015 1.0  0.2 - 1.0 K/uL Final  . Eosinophils Relative 10/29/2015 1  % Final  . Eosinophils Absolute 10/29/2015 0.1  0 - 0.7 K/uL Final  . Basophils Relative 10/29/2015 0  % Final  . Basophils Absolute 10/29/2015 0.0  0 - 0.1 K/uL Final  . Sodium 10/29/2015 130* 135 - 145 mmol/L Final  . Potassium 10/29/2015 3.7  3.5 - 5.1 mmol/L Final  . Chloride 10/29/2015 91* 101 - 111 mmol/L Final  . CO2 10/29/2015 30  22 - 32 mmol/L Final  . Glucose, Bld 10/29/2015 268* 65 - 99 mg/dL Final  . BUN 10/29/2015 18  6 - 20 mg/dL Final  . Creatinine, Ser 10/29/2015 1.01  0.61 - 1.24 mg/dL Final  . Calcium 10/29/2015 8.9  8.9 - 10.3 mg/dL Final  . GFR calc non Af Amer 10/29/2015 >60  >60 mL/min Final  . GFR calc Af Amer 10/29/2015 >60  >60 mL/min Final   Comment: (NOTE) The eGFR has been calculated using the CKD EPI  equation. This calculation has not been validated in all clinical situations. eGFR's persistently <60 mL/min signify possible Chronic Kidney Disease.   . Anion gap 10/29/2015 9  5 - 15 Final  . Magnesium 10/29/2015 1.7  1.7 - 2.4 mg/dL Final    Assessment:  Trexton Escamilla Reinders is a 60 y.o. male with clinical T2N3M0 base of tongue squamous cell carcinoma s/p biopsy on 09/10/2015.  Tumor was p16 IHC positive (high risk HPV).  CT soft tissue neck on 08/17/2015 revealed a slowly progressive over years, infiltrative, deep 4.1 x 5.4 cm LEFT parotid mass with widespread ipsilateral bulky adenopathy.  PET scan at [facility] on 09/17/2015 revealed left base of tongue malignancy corresponding to asymmetric enhancement identified on the neck CT with hypermetabolic extensive conglomerate left cervical adenopathy (level II-IV) extending from the level of the angle of the mandible down inferiorly to the level of the cricoid cartilage.  There was clustered subcentimeter left level IB submandibular nodes with mild FDG activity.  There was no evidence of metastatic disease.  Alpha gal IgE was 1.21 (< 0.35) c/w IgE antibodies to galactose-alpha-1,3 galactose (oligosaccharide part on Fab portion of the cetuximab heavy chain) and suggestive of potential cetuximab reaction.  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  Pathologic stage was T4NxM0.  He took imatinib for 1 year.  He is currently day 8 of cycle #1 cisplatin (10/22/2015).  He has had some nausea, well controlled.  He has some left sided tinnitus.  Symptomatically, he has otalagia and left neck pain.  Exam reveals conglomerate 6 cm left neck adenopathy  (improved).  He has mild hyponatremia (130).  Plan: 1.  Labs today:  CBC with diff, BMP, Mg. 2.  Discuss management of nausea.  Patient to bring in medications. 3.  Discuss hyponatremia.  Discuss  fluids with electrolytes.  Discuss salt on food. 4.  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  Patient wishes to proceed.with additional cycles of chemotherapy. 5.  Schedule hearing test. 6.  RTC on 11/02/2015 for labs (BMP). 7.  RTC in 1 week for MD assess and labs (CBC with diff, BMP, Mg) 8.  RTC in 2 weeks for MD assess, labs (CBC with diff, CMP, Mg), and cycle #2 cisplatin.   Lequita Asal, MD  10/29/2015, 9:43 AM

## 2015-10-30 ENCOUNTER — Ambulatory Visit
Admission: RE | Admit: 2015-10-30 | Discharge: 2015-10-30 | Disposition: A | Discharge status: Home / Self Care | Payer: Managed Care, Other (non HMO) | Source: Ambulatory Visit | Attending: Radiation Oncology | Admitting: Radiation Oncology

## 2015-10-30 DIAGNOSIS — C01 Malignant neoplasm of base of tongue: Secondary | ICD-10-CM | POA: Diagnosis not present

## 2015-10-31 ENCOUNTER — Ambulatory Visit
Admission: RE | Admit: 2015-10-31 | Discharge: 2015-10-31 | Disposition: A | Discharge status: Home / Self Care | Payer: Managed Care, Other (non HMO) | Source: Ambulatory Visit | Attending: Radiation Oncology | Admitting: Radiation Oncology

## 2015-10-31 DIAGNOSIS — C01 Malignant neoplasm of base of tongue: Secondary | ICD-10-CM | POA: Diagnosis not present

## 2015-11-01 ENCOUNTER — Other Ambulatory Visit: Payer: Self-pay | Admitting: *Deleted

## 2015-11-01 ENCOUNTER — Ambulatory Visit: Payer: Managed Care, Other (non HMO)

## 2015-11-01 MED ORDER — SUCRALFATE 1 G PO TABS: 1.0000 g | ORAL_TABLET | Freq: Three times a day (TID) | ORAL | 3 refills | Status: DC

## 2015-11-01 MED ORDER — FLUCONAZOLE 100 MG PO TABS: 100.0000 mg | ORAL_TABLET | Freq: Every day | ORAL | 0 refills | Status: DC

## 2015-11-01 MED ORDER — DEXAMETHASONE 2 MG PO TABS: 2.0000 mg | ORAL_TABLET | Freq: Two times a day (BID) | ORAL | 0 refills | Status: DC

## 2015-11-02 ENCOUNTER — Ambulatory Visit: Payer: Managed Care, Other (non HMO)

## 2015-11-02 ENCOUNTER — Inpatient Hospital Stay: Payer: Managed Care, Other (non HMO) | Attending: Hematology and Oncology

## 2015-11-02 DIAGNOSIS — I10 Essential (primary) hypertension: Secondary | ICD-10-CM | POA: Insufficient documentation

## 2015-11-02 DIAGNOSIS — Z5111 Encounter for antineoplastic chemotherapy: Secondary | ICD-10-CM | POA: Insufficient documentation

## 2015-11-02 DIAGNOSIS — M129 Arthropathy, unspecified: Secondary | ICD-10-CM | POA: Insufficient documentation

## 2015-11-02 DIAGNOSIS — E1165 Type 2 diabetes mellitus with hyperglycemia: Secondary | ICD-10-CM | POA: Insufficient documentation

## 2015-11-02 DIAGNOSIS — Z79899 Other long term (current) drug therapy: Secondary | ICD-10-CM | POA: Insufficient documentation

## 2015-11-02 DIAGNOSIS — Z800 Family history of malignant neoplasm of digestive organs: Secondary | ICD-10-CM | POA: Insufficient documentation

## 2015-11-02 DIAGNOSIS — Z794 Long term (current) use of insulin: Secondary | ICD-10-CM | POA: Insufficient documentation

## 2015-11-02 DIAGNOSIS — E119 Type 2 diabetes mellitus without complications: Secondary | ICD-10-CM | POA: Insufficient documentation

## 2015-11-02 DIAGNOSIS — E785 Hyperlipidemia, unspecified: Secondary | ICD-10-CM | POA: Insufficient documentation

## 2015-11-02 DIAGNOSIS — D696 Thrombocytopenia, unspecified: Secondary | ICD-10-CM | POA: Insufficient documentation

## 2015-11-02 DIAGNOSIS — E669 Obesity, unspecified: Secondary | ICD-10-CM | POA: Insufficient documentation

## 2015-11-02 DIAGNOSIS — Z7984 Long term (current) use of oral hypoglycemic drugs: Secondary | ICD-10-CM | POA: Insufficient documentation

## 2015-11-02 DIAGNOSIS — C01 Malignant neoplasm of base of tongue: Secondary | ICD-10-CM | POA: Insufficient documentation

## 2015-11-02 DIAGNOSIS — R1310 Dysphagia, unspecified: Secondary | ICD-10-CM | POA: Insufficient documentation

## 2015-11-02 DIAGNOSIS — Z85028 Personal history of other malignant neoplasm of stomach: Secondary | ICD-10-CM | POA: Insufficient documentation

## 2015-11-02 DIAGNOSIS — Z808 Family history of malignant neoplasm of other organs or systems: Secondary | ICD-10-CM | POA: Insufficient documentation

## 2015-11-02 DIAGNOSIS — J029 Acute pharyngitis, unspecified: Secondary | ICD-10-CM | POA: Insufficient documentation

## 2015-11-02 DIAGNOSIS — E871 Hypo-osmolality and hyponatremia: Secondary | ICD-10-CM | POA: Insufficient documentation

## 2015-11-02 NOTE — Telephone Encounter (Signed)
Called number in chart [phone] and left message that FMLA forms are done and we will give them to him Tuesday at his appointment

## 2015-11-04 ENCOUNTER — Other Ambulatory Visit: Payer: Self-pay | Admitting: Hematology and Oncology

## 2015-11-05 NOTE — Progress Notes (Signed)
[facility] day:  11/06/15  Chief Complaint: Kent [name] is a 60 y.o. 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  HPI:  The patient was last seen in the medical oncology clinic on 10/29/2015.  At that time, he was seen on day 8 of cycle #1 cisplatin.  His neck mass was smaller.  He had mild hyponatremia (130).  Fluids with electrolytes were discussed.  Symptomatically, his throat is hurting today. He received radiation on 10/31/2015 but not on Friday, 11/02/2015. He was started on Decadron 4 mg BID. He received radiation today.  He states that soft foods are okay to swallow.  He craves junk food and sweats.   Past Medical History:  Diagnosis Date  . Arthritis   . Diabetes mellitus without complication ([place])   . Hyperlipidemia   . Hypertension   . Obesity   . Stomach cancer [facility])    had tumor removed    Past Surgical History:  Procedure Laterality Date  . gastric tumor removed  2010    Family History  Problem Relation Age of Onset  . Heart disease Mother   . Hypertension Father   . Cancer Sister     stomach  . Heart disease Brother     MI  . Cancer Daughter   . Cancer Son     testicular  . ADD / ADHD Sister     Social History:  reports that he has never smoked. He has never used smokeless tobacco. He reports that he does not drink alcohol or use drugs.  He smokes a pipe and cigar rarely (once every 3 months).  He has a son and daughter.  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  He has planned (and paid for) a trip to the beach from 10/15/2015 - 10/20/2015.  He was no short term disability.  He has 10 weeks of vacation and sick leave.    Allergies: No Known Allergies  Current Medications: Current Outpatient Prescriptions  Medication Sig Dispense Refill  . atorvastatin (LIPITOR) 20 MG  tablet Take 1 tablet (20 mg total) by mouth daily. Reported on 04/06/2015 90 tablet 1  . blood glucose meter kit and supplies KIT Dispense based on patient and insurance preference. Use up to four times daily as directed. (FOR ICD-9 250.00, 250.01). 1 each 12  . cyclobenzaprine (FLEXERIL) 10 MG tablet Take 1 tablet (10 mg total) by mouth at bedtime. 30 tablet 2  . dexamethasone (DECADRON) 2 MG tablet Take 1 tablet (2 mg total) by mouth 2 (two) times daily. 20 tablet 0  . dexamethasone (DECADRON) 4 MG tablet Take 2 tablets by mouth once a day on the day after chemotherapy and then take 2 tablets two times a day for 2 days. Take with food. 30 tablet 1  . fluconazole (DIFLUCAN) 100 MG tablet Take 1 tablet (100 mg total) by mouth daily. 5 tablet 0  . Insulin Glargine (LANTUS) 100 UNIT/ML Solostar Pen Inject 36 Units into the skin daily at 10 pm. 4 pen 3  . lisinopril-hydrochlorothiazide (PRINZIDE,ZESTORETIC) 20-25 MG tablet Take 1 tablet by mouth daily. 90 tablet 1  . LORazepam (ATIVAN) 0.5 MG tablet Take 1 tablet (0.5 mg total) by mouth every 6 (six) hours as needed (Nausea or vomiting). 30 tablet 0  . meloxicam (MOBIC) 15  MG tablet Take 1 tablet (15 mg total) by mouth daily. 30 tablet 0  . metFORMIN (GLUCOPHAGE) 1000 MG tablet Take 1 tablet (1,000 mg total) by mouth 2 (two) times daily with a meal. 180 tablet 3  . ondansetron (ZOFRAN) 8 MG tablet Take 1 tablet (8 mg total) by mouth 2 (two) times daily as needed. Start on the third day after chemotherapy. 30 tablet 1  . oxyCODONE-acetaminophen (PERCOCET/ROXICET) 5-325 MG tablet Take 1 tablet by mouth every 6 (six) hours as needed for severe pain. 40 tablet 0  . prochlorperazine (COMPAZINE) 10 MG tablet Take 1 tablet (10 mg total) by mouth every 6 (six) hours as needed (Nausea or vomiting). 30 tablet 1  . sildenafil (REVATIO) 20 MG tablet 1-3 Tablet daily as needed ORAL 10 tablet 0  . sildenafil (VIAGRA) 50 MG tablet Take 50 mg by mouth daily as needed for  erectile dysfunction.    . sucralfate (CARAFATE) 1 g tablet Take 1 tablet (1 g total) by mouth 3 (three) times daily. Dissolve in 2 to 3 tbs warm water, swish &swallow 90 tablet 3  . UNIFINE PENTIPS 31G X 6 MM MISC 4 (four) times daily.     No current facility-administered medications for this visit.     Review of Systems:  GENERAL:  Feels "ok".  No fevers or sweats.  Weight down 4 pounds. PERFORMANCE STATUS (ECOG):  1 HEENT:  Vision little blurry secondary to diabetes.  Hearing not normal at baseline (tested at work in past).  Throat sore.  No runny nose, mouth sores or tenderness. Lungs: No shortness of breath or cough.  No hemoptysis. Cardiac:  No chest pain, palpitations, orthopnea, or PND.  GI:  No nausea, vomiting, diarrhea, constipation, melena or hematochezia.  No prior colonoscopy. GU:  No urgency, frequency, dysuria, or hematuria.  Musculoskeletal:  No back pain.  Knee aches.  No muscle tenderness. Extremities:  No pain or swelling. Skin:  No rashes or skin changes. Neuro:  No headache, numbness or weakness, balance or coordination issues. Endocrine:  Diabetes.  No thyroid issues, hot flashes or night sweats. Psych:  No mood changes, depression or anxiety. Pain:  Throat discomfort. Review of systems:  All other systems reviewed and found to be negative.  Physical Exam: Blood pressure 127/87, pulse 71, temperature (!) 95.7 F (35.4 C), temperature source Tympanic, resp. rate 18, weight 206 lb 12.7 oz (93.8 kg). GENERAL:  Well developed, well nourished, gentleman sitting comfortably in the exam room in no acute distress. MENTAL STATUS:  Alert and oriented to person, place and time. HEAD:  Short gray hair.  Goatee.  Normocephalic, atraumatic, face symmetric, no Cushingoid features. EYES:  Blue eyes.  Pupils equal round and reactive to light and accomodation.  No conjunctivitis or scleral icterus. ENT:  Oropharynx clear mild posterior pharynx mucositis.  Tongue normal.  Mucous  membranes moist.  RESPIRATORY:  Clear to auscultation without rales, wheezes or rhonchi. CARDIOVASCULAR:  Regular rate and rhythm without murmur, rub or gallop. ABDOMEN:  Soft, non-tender, with active bowel sounds, and no hepatosplenomegaly.  No masses. SKIN:  No rashes, ulcers or lesions. EXTREMITIES: No edema, no skin discoloration or tenderness.  No palpable cords. LYMPH NODES: Conglomerate left neck adenopathy 6.x 5 cm (slightly improved).  No palpable supraclavicular, axillary or inguinal adenopathy  NEUROLOGICAL: Unremarkable. PSYCH:  Appropriate.   Orders Only on 11/06/2015  Component Date Value Ref Range Status  . Sodium 11/06/2015 131* 135 - 145 mmol/L Final  . Potassium 11/06/2015  3.5  3.5 - 5.1 mmol/L Final  . Chloride 11/06/2015 95* 101 - 111 mmol/L Final  . CO2 11/06/2015 29  22 - 32 mmol/L Final  . Glucose, Bld 11/06/2015 171* 65 - 99 mg/dL Final  . BUN 11/06/2015 19  6 - 20 mg/dL Final  . Creatinine, Ser 11/06/2015 0.87  0.61 - 1.24 mg/dL Final  . Calcium 11/06/2015 8.9  8.9 - 10.3 mg/dL Final  . GFR calc non Af Amer 11/06/2015 >60  >60 mL/min Final  . GFR calc Af Amer 11/06/2015 >60  >60 mL/min Final   Comment: (NOTE) The eGFR has been calculated using the CKD EPI equation. This calculation has not been validated in all clinical situations. eGFR's persistently <60 mL/min signify possible Chronic Kidney Disease.   . Anion gap 11/06/2015 7  5 - 15 Final  . WBC 11/06/2015 10.4  3.8 - 10.6 K/uL Final  . RBC 11/06/2015 5.09  4.40 - 5.90 MIL/uL Final  . Hemoglobin 11/06/2015 15.2  13.0 - 18.0 g/dL Final  . HCT 11/06/2015 43.1  40.0 - 52.0 % Final  . MCV 11/06/2015 84.6  80.0 - 100.0 fL Final  . MCH 11/06/2015 29.8  26.0 - 34.0 pg Final  . MCHC 11/06/2015 35.2  32.0 - 36.0 g/dL Final  . RDW 11/06/2015 14.2  11.5 - 14.5 % Final  . Platelets 11/06/2015 179  150 - 440 K/uL Final  . Neutrophils Relative % 11/06/2015 68  % Final  . Neutro Abs 11/06/2015 7.1* 1.4 - 6.5 K/uL  Final  . Lymphocytes Relative 11/06/2015 19  % Final  . Lymphs Abs 11/06/2015 2.0  1.0 - 3.6 K/uL Final  . Monocytes Relative 11/06/2015 10  % Final  . Monocytes Absolute 11/06/2015 1.1* 0.2 - 1.0 K/uL Final  . Eosinophils Relative 11/06/2015 1  % Final  . Eosinophils Absolute 11/06/2015 0.1  0 - 0.7 K/uL Final  . Basophils Relative 11/06/2015 2  % Final  . Basophils Absolute 11/06/2015 0.2* 0 - 0.1 K/uL Final  . Magnesium 11/06/2015 1.8  1.7 - 2.4 mg/dL Final    Assessment:  Jamarco Zaldivar Goto is a 60 y.o. male with clinical T2N3M0 base of tongue squamous cell carcinoma s/p biopsy on 09/10/2015.  Tumor was p16 IHC positive (high risk HPV).  CT soft tissue neck on 08/17/2015 revealed a slowly progressive over years, infiltrative, deep 4.1 x 5.4 cm LEFT parotid mass with widespread ipsilateral bulky adenopathy.  [facility] scan [facility] on 09/17/2015 revealed left base of tongue malignancy corresponding to asymmetric enhancement identified on the neck CT with hypermetabolic extensive conglomerate left cervical adenopathy (level II-IV) extending from the level of the angle of the mandible down inferiorly to the level of the cricoid cartilage.  There was clustered subcentimeter left level IB submandibular nodes with mild FDG activity.  There was no evidence of metastatic disease.  Alpha gal IgE was 1.21 (< 0.35) c/w IgE antibodies to galactose-alpha-1,3 galactose (oligosaccharide part on Fab portion of the cetuximab heavy chain) and suggestive of potential cetuximab reaction.  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  Pathologic stage was T4NxM0.  He took imatinib for 1 year.  He is currently day 16 of cycle #1 cisplatin (10/22/2015).  Radiation was held on 11/02/2015 secondary to difficulty swallowing.  He was started on Decadron 4 mg BID.  Symptomatically, he has sore throat.  Exam  reveals  improving left neck adenopathy.  He has mild hyponatremia (131).  Plan: 1.  Labs today:  CBC with diff, BMP, Mg. 2.  Continue fluids with electrolytes and added salt. 3.  RTC on 11/12/2015 for MD assess, labs (CBC with diff, CMP, Mg), and cycle #2 cisplatin.   Lequita Asal, MD  11/06/2015, 10:05 AM

## 2015-11-06 ENCOUNTER — Inpatient Hospital Stay: Payer: Managed Care, Other (non HMO)

## 2015-11-06 ENCOUNTER — Other Ambulatory Visit: Payer: Self-pay

## 2015-11-06 ENCOUNTER — Ambulatory Visit
Admission: RE | Admit: 2015-11-06 | Discharge: 2015-11-06 | Disposition: A | Discharge status: Home / Self Care | Payer: Managed Care, Other (non HMO) | Source: Ambulatory Visit | Attending: Radiation Oncology | Admitting: Radiation Oncology

## 2015-11-06 ENCOUNTER — Inpatient Hospital Stay (HOSPITAL_BASED_OUTPATIENT_CLINIC_OR_DEPARTMENT_OTHER): Payer: Managed Care, Other (non HMO) | Admitting: Hematology and Oncology

## 2015-11-06 VITALS — BP 127/87 | HR 71 | Temp 95.7°F | Resp 18 | Wt 206.8 lb

## 2015-11-06 DIAGNOSIS — J029 Acute pharyngitis, unspecified: Secondary | ICD-10-CM | POA: Diagnosis not present

## 2015-11-06 DIAGNOSIS — Z808 Family history of malignant neoplasm of other organs or systems: Secondary | ICD-10-CM

## 2015-11-06 DIAGNOSIS — Z7984 Long term (current) use of oral hypoglycemic drugs: Secondary | ICD-10-CM | POA: Diagnosis not present

## 2015-11-06 DIAGNOSIS — R1310 Dysphagia, unspecified: Secondary | ICD-10-CM | POA: Diagnosis not present

## 2015-11-06 DIAGNOSIS — Z800 Family history of malignant neoplasm of digestive organs: Secondary | ICD-10-CM | POA: Diagnosis not present

## 2015-11-06 DIAGNOSIS — Z79899 Other long term (current) drug therapy: Secondary | ICD-10-CM

## 2015-11-06 DIAGNOSIS — Z5111 Encounter for antineoplastic chemotherapy: Secondary | ICD-10-CM | POA: Diagnosis not present

## 2015-11-06 DIAGNOSIS — E1165 Type 2 diabetes mellitus with hyperglycemia: Secondary | ICD-10-CM | POA: Diagnosis not present

## 2015-11-06 DIAGNOSIS — E785 Hyperlipidemia, unspecified: Secondary | ICD-10-CM

## 2015-11-06 DIAGNOSIS — E669 Obesity, unspecified: Secondary | ICD-10-CM | POA: Diagnosis not present

## 2015-11-06 DIAGNOSIS — E119 Type 2 diabetes mellitus without complications: Secondary | ICD-10-CM

## 2015-11-06 DIAGNOSIS — Z85028 Personal history of other malignant neoplasm of stomach: Secondary | ICD-10-CM

## 2015-11-06 DIAGNOSIS — C01 Malignant neoplasm of base of tongue: Secondary | ICD-10-CM | POA: Diagnosis not present

## 2015-11-06 DIAGNOSIS — I10 Essential (primary) hypertension: Secondary | ICD-10-CM

## 2015-11-06 DIAGNOSIS — M129 Arthropathy, unspecified: Secondary | ICD-10-CM

## 2015-11-06 DIAGNOSIS — Z794 Long term (current) use of insulin: Secondary | ICD-10-CM | POA: Diagnosis not present

## 2015-11-06 DIAGNOSIS — E871 Hypo-osmolality and hyponatremia: Secondary | ICD-10-CM | POA: Diagnosis not present

## 2015-11-06 DIAGNOSIS — D696 Thrombocytopenia, unspecified: Secondary | ICD-10-CM | POA: Diagnosis not present

## 2015-11-06 LAB — BASIC METABOLIC PANEL
Anion gap: 7 (ref 5–15)
BUN: 19 mg/dL (ref 6–20)
CO2: 29 mmol/L (ref 22–32)
Calcium: 8.9 mg/dL (ref 8.9–10.3)
Chloride: 95 mmol/L — ABNORMAL LOW (ref 101–111)
Creatinine, Ser: 0.87 mg/dL (ref 0.61–1.24)
GFR calc Af Amer: >60 mL/min (ref >60)
GFR calc non Af Amer: >60 mL/min (ref >60)
Glucose, Bld: 171 mg/dL — ABNORMAL HIGH (ref 65–99)
Potassium: 3.5 mmol/L (ref 3.5–5.1)
Sodium: 131 mmol/L — ABNORMAL LOW (ref 135–145)

## 2015-11-06 LAB — CBC WITH DIFFERENTIAL/PLATELET
Basophils Absolute: 0.2 10*3/uL — ABNORMAL HIGH (ref 0–0.1)
Basophils Relative: 2 %
Eosinophils Absolute: 0.1 10*3/uL (ref 0–0.7)
Eosinophils Relative: 1 %
HCT: 43.1 % (ref 40.0–52.0)
Hemoglobin: 15.2 g/dL (ref 13.0–18.0)
Lymphocytes Relative: 19 %
Lymphs Abs: 2 10*3/uL (ref 1.0–3.6)
MCH: 29.8 pg (ref 26.0–34.0)
MCHC: 35.2 g/dL (ref 32.0–36.0)
MCV: 84.6 fL (ref 80.0–100.0)
Monocytes Absolute: 1.1 10*3/uL — ABNORMAL HIGH (ref 0.2–1.0)
Monocytes Relative: 10 %
Neutro Abs: 7.1 10*3/uL — ABNORMAL HIGH (ref 1.4–6.5)
Neutrophils Relative %: 68 %
Platelets: 179 10*3/uL (ref 150–440)
RBC: 5.09 MIL/uL (ref 4.40–5.90)
RDW: 14.2 % (ref 11.5–14.5)
WBC: 10.4 10*3/uL (ref 3.8–10.6)

## 2015-11-06 LAB — MAGNESIUM: Magnesium: 1.8 mg/dL (ref 1.7–2.4)

## 2015-11-06 NOTE — Progress Notes (Signed)
Patient states his throat continue to be sore.  Feels hungry but unable to swallow solids very well.  Tolerates warm foods better than cold.  States he gets tired easily.

## 2015-11-07 ENCOUNTER — Ambulatory Visit
Admission: RE | Admit: 2015-11-07 | Discharge: 2015-11-07 | Disposition: A | Discharge status: Home / Self Care | Payer: Managed Care, Other (non HMO) | Source: Ambulatory Visit | Attending: Radiation Oncology | Admitting: Radiation Oncology

## 2015-11-07 DIAGNOSIS — C01 Malignant neoplasm of base of tongue: Secondary | ICD-10-CM | POA: Diagnosis not present

## 2015-11-08 ENCOUNTER — Ambulatory Visit
Admission: RE | Admit: 2015-11-08 | Discharge: 2015-11-08 | Disposition: A | Discharge status: Home / Self Care | Payer: Managed Care, Other (non HMO) | Source: Ambulatory Visit | Attending: Radiation Oncology | Admitting: Radiation Oncology

## 2015-11-08 DIAGNOSIS — C01 Malignant neoplasm of base of tongue: Secondary | ICD-10-CM | POA: Diagnosis not present

## 2015-11-09 ENCOUNTER — Ambulatory Visit
Admission: RE | Admit: 2015-11-09 | Discharge: 2015-11-09 | Disposition: A | Discharge status: Home / Self Care | Payer: Managed Care, Other (non HMO) | Source: Ambulatory Visit | Attending: Radiation Oncology | Admitting: Radiation Oncology

## 2015-11-09 DIAGNOSIS — C01 Malignant neoplasm of base of tongue: Secondary | ICD-10-CM | POA: Diagnosis not present

## 2015-11-11 ENCOUNTER — Other Ambulatory Visit: Payer: Self-pay | Admitting: Hematology and Oncology

## 2015-11-11 DIAGNOSIS — C01 Malignant neoplasm of base of tongue: Secondary | ICD-10-CM

## 2015-11-12 ENCOUNTER — Inpatient Hospital Stay: Payer: Managed Care, Other (non HMO)

## 2015-11-12 ENCOUNTER — Inpatient Hospital Stay (HOSPITAL_BASED_OUTPATIENT_CLINIC_OR_DEPARTMENT_OTHER): Payer: Managed Care, Other (non HMO) | Admitting: Hematology and Oncology

## 2015-11-12 ENCOUNTER — Ambulatory Visit
Admission: RE | Admit: 2015-11-12 | Discharge: 2015-11-12 | Disposition: A | Discharge status: Home / Self Care | Payer: Managed Care, Other (non HMO) | Source: Ambulatory Visit | Attending: Radiation Oncology | Admitting: Radiation Oncology

## 2015-11-12 VITALS — BP 129/84 | HR 94 | Temp 98.2°F | Resp 18 | Wt 207.7 lb

## 2015-11-12 VITALS — BP 110/73 | HR 81 | Temp 97.9°F | Resp 18

## 2015-11-12 DIAGNOSIS — E785 Hyperlipidemia, unspecified: Secondary | ICD-10-CM

## 2015-11-12 DIAGNOSIS — E871 Hypo-osmolality and hyponatremia: Secondary | ICD-10-CM

## 2015-11-12 DIAGNOSIS — Z808 Family history of malignant neoplasm of other organs or systems: Secondary | ICD-10-CM

## 2015-11-12 DIAGNOSIS — I10 Essential (primary) hypertension: Secondary | ICD-10-CM

## 2015-11-12 DIAGNOSIS — C01 Malignant neoplasm of base of tongue: Secondary | ICD-10-CM

## 2015-11-12 DIAGNOSIS — Z85028 Personal history of other malignant neoplasm of stomach: Secondary | ICD-10-CM

## 2015-11-12 DIAGNOSIS — R1310 Dysphagia, unspecified: Secondary | ICD-10-CM

## 2015-11-12 DIAGNOSIS — Z800 Family history of malignant neoplasm of digestive organs: Secondary | ICD-10-CM

## 2015-11-12 DIAGNOSIS — J029 Acute pharyngitis, unspecified: Secondary | ICD-10-CM

## 2015-11-12 DIAGNOSIS — E669 Obesity, unspecified: Secondary | ICD-10-CM

## 2015-11-12 DIAGNOSIS — Z79899 Other long term (current) drug therapy: Secondary | ICD-10-CM

## 2015-11-12 DIAGNOSIS — Z794 Long term (current) use of insulin: Secondary | ICD-10-CM

## 2015-11-12 DIAGNOSIS — E119 Type 2 diabetes mellitus without complications: Secondary | ICD-10-CM

## 2015-11-12 DIAGNOSIS — M129 Arthropathy, unspecified: Secondary | ICD-10-CM

## 2015-11-12 DIAGNOSIS — G893 Neoplasm related pain (acute) (chronic): Secondary | ICD-10-CM

## 2015-11-12 DIAGNOSIS — Z7984 Long term (current) use of oral hypoglycemic drugs: Secondary | ICD-10-CM

## 2015-11-12 LAB — OSMOLALITY: Osmolality: 270 mOsm/kg — ABNORMAL LOW (ref 275–295)

## 2015-11-12 LAB — OSMOLALITY, URINE: Osmolality, Ur: 782 mOsm/kg (ref 300–900)

## 2015-11-12 LAB — CBC WITH DIFFERENTIAL/PLATELET
Basophils Absolute: 0 10*3/uL (ref 0–0.1)
Basophils Relative: 0 %
Eosinophils Absolute: 0 10*3/uL (ref 0–0.7)
Eosinophils Relative: 0 %
HCT: 39.7 % — ABNORMAL LOW (ref 40.0–52.0)
Hemoglobin: 14.2 g/dL (ref 13.0–18.0)
Lymphocytes Relative: 11 %
Lymphs Abs: 0.7 10*3/uL — ABNORMAL LOW (ref 1.0–3.6)
MCH: 30 pg (ref 26.0–34.0)
MCHC: 35.7 g/dL (ref 32.0–36.0)
MCV: 84.1 fL (ref 80.0–100.0)
Monocytes Absolute: 0.8 10*3/uL (ref 0.2–1.0)
Monocytes Relative: 12 %
Neutro Abs: 5 10*3/uL (ref 1.4–6.5)
Neutrophils Relative %: 77 %
Platelets: 173 10*3/uL (ref 150–440)
RBC: 4.73 MIL/uL (ref 4.40–5.90)
RDW: 14.6 % — ABNORMAL HIGH (ref 11.5–14.5)
WBC: 6.5 10*3/uL (ref 3.8–10.6)

## 2015-11-12 LAB — COMPREHENSIVE METABOLIC PANEL
ALT: 24 U/L (ref 17–63)
AST: 17 U/L (ref 15–41)
Albumin: 3.8 g/dL (ref 3.5–5.0)
Alkaline Phosphatase: 81 U/L (ref 38–126)
Anion gap: 8 (ref 5–15)
BUN: 19 mg/dL (ref 6–20)
CO2: 28 mmol/L (ref 22–32)
Calcium: 8.4 mg/dL — ABNORMAL LOW (ref 8.9–10.3)
Chloride: 90 mmol/L — ABNORMAL LOW (ref 101–111)
Creatinine, Ser: 0.88 mg/dL (ref 0.61–1.24)
GFR calc Af Amer: >60 mL/min (ref >60)
GFR calc non Af Amer: >60 mL/min (ref >60)
Glucose, Bld: 160 mg/dL — ABNORMAL HIGH (ref 65–99)
Potassium: 3.6 mmol/L (ref 3.5–5.1)
Sodium: 126 mmol/L — ABNORMAL LOW (ref 135–145)
Total Bilirubin: 0.7 mg/dL (ref 0.3–1.2)
Total Protein: 6.5 g/dL (ref 6.5–8.1)

## 2015-11-12 LAB — URINALYSIS COMPLETE WITH MICROSCOPIC (ARMC ONLY)
Bacteria, UA: NONE SEEN
Bilirubin Urine: NEGATIVE
Glucose, UA: NEGATIVE mg/dL
Hgb urine dipstick: NEGATIVE
Ketones, ur: NEGATIVE mg/dL
Leukocytes, UA: NEGATIVE
Nitrite: NEGATIVE
Protein, ur: NEGATIVE mg/dL
Specific Gravity, Urine: 1.02 (ref 1.005–1.030)
Squamous Epithelial / LPF: NONE SEEN
pH: 5 (ref 5.0–8.0)

## 2015-11-12 LAB — MAGNESIUM: Magnesium: 1.4 mg/dL — ABNORMAL LOW (ref 1.7–2.4)

## 2015-11-12 LAB — URIC ACID: Uric Acid, Serum: 5 mg/dL (ref 4.4–7.6)

## 2015-11-12 LAB — SODIUM, URINE, RANDOM: Sodium, Ur: 85 mmol/L

## 2015-11-12 MED ORDER — MANNITOL 25 % IV SOLN
Freq: Once | INTRAVENOUS | Status: AC
  Administered 2015-11-12: 11:00:00 via INTRAVENOUS
  Filled 2015-11-12: qty 1000

## 2015-11-12 MED ORDER — PALONOSETRON HCL INJECTION 0.25 MG/5ML
0.2500 mg | Freq: Once | INTRAVENOUS | Status: AC
  Administered 2015-11-12: 0.25 mg via INTRAVENOUS
  Filled 2015-11-12: qty 5

## 2015-11-12 MED ORDER — OXYCODONE-ACETAMINOPHEN 5-325 MG PO TABS: 1.0000 | ORAL_TABLET | Freq: Four times a day (QID) | ORAL | 0 refills | Status: DC | PRN

## 2015-11-12 MED ORDER — MAGNESIUM SULFATE 2 GM/50ML IV SOLN
2.0000 g | Freq: Once | INTRAVENOUS | Status: AC
  Administered 2015-11-12: 2 g via INTRAVENOUS

## 2015-11-12 MED ORDER — SODIUM CHLORIDE 0.9 % IV SOLN
Freq: Once | INTRAVENOUS | Status: AC
  Administered 2015-11-12: 13:00:00 via INTRAVENOUS
  Filled 2015-11-12: qty 5

## 2015-11-12 MED ORDER — SODIUM CHLORIDE 0.9 % IV SOLN
100.0000 mg/m2 | Freq: Once | INTRAVENOUS | Status: AC
  Administered 2015-11-12: 212 mg via INTRAVENOUS
  Filled 2015-11-12: qty 212

## 2015-11-12 NOTE — Progress Notes (Signed)
[name] day:  11/12/15  Chief Complaint: Kent [name] is a 60 y.o. 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  HPI:  The patient was last seen in the medical oncology clinic on 11/06/2015.  At that time, he was on day 16 of cycle #1 cisplatin.  Radiation was held for 1 day secondary to early mucositis and throat pain.  His neck mass was smaller.  He had mild hyponatremia (131).  Fluids with electrolytes were discussed.  Symptomatically he is doing well. He states that he was confusing his nausea pills for steroid pills.  Radiation continues.  He is drinking fluids with electrolytes.   Past Medical History:  Diagnosis Date  . Arthritis   . Diabetes mellitus without complication ([place])   . Hyperlipidemia   . Hypertension   . Obesity   . Stomach cancer [facility])    had tumor removed    Past Surgical History:  Procedure Laterality Date  . gastric tumor removed  2010    Family History  Problem Relation Age of Onset  . Heart disease Mother   . Hypertension Father   . Cancer Sister     stomach  . Heart disease Brother     MI  . Cancer Daughter   . Cancer Son     testicular  . ADD / ADHD Sister     Social History:  reports that he has never smoked. He has never used smokeless tobacco. He reports that he does not drink alcohol or use drugs.  He smokes a pipe and cigar rarely (once every 3 months).  He has a son and daughter.  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  He has planned (and paid for) a trip to the beach from 10/15/2015 - 10/20/2015.  He was no short term disability.  He has 10 weeks of vacation and sick leave.  The patient is accompanied by his wife, Kent [name], today.  Allergies: No Known Allergies  Current Medications: Current Outpatient Prescriptions  Medication Sig Dispense Refill  .  atorvastatin (LIPITOR) 20 MG tablet Take 1 tablet (20 mg total) by mouth daily. Reported on 04/06/2015 90 tablet 1  . blood glucose meter kit and supplies KIT Dispense based on patient and insurance preference. Use up to four times daily as directed. (FOR ICD-9 250.00, 250.01). 1 each 12  . cyclobenzaprine (FLEXERIL) 10 MG tablet Take 1 tablet (10 mg total) by mouth at bedtime. 30 tablet 2  . dexamethasone (DECADRON) 2 MG tablet Take 1 tablet (2 mg total) by mouth 2 (two) times daily. 20 tablet 0  . dexamethasone (DECADRON) 4 MG tablet Take 2 tablets by mouth once a day on the day after chemotherapy and then take 2 tablets two times a day for 2 days. Take with food. 30 tablet 1  . fluconazole (DIFLUCAN) 100 MG tablet Take 1 tablet (100 mg total) by mouth daily. 5 tablet 0  . Insulin Glargine (LANTUS) 100 UNIT/ML Solostar Pen Inject 36 Units into the skin daily at 10 pm. 4 pen 3  . lisinopril-hydrochlorothiazide (PRINZIDE,ZESTORETIC) 20-25 MG tablet Take 1 tablet by mouth daily. 90 tablet 1  . LORazepam (ATIVAN) 0.5 MG tablet Take 1 tablet (0.5 mg total) by mouth every 6 (six) hours as needed (Nausea or vomiting). 30 tablet 0  . meloxicam (MOBIC)  15 MG tablet Take 1 tablet (15 mg total) by mouth daily. 30 tablet 0  . metFORMIN (GLUCOPHAGE) 1000 MG tablet Take 1 tablet (1,000 mg total) by mouth 2 (two) times daily with a meal. 180 tablet 3  . ondansetron (ZOFRAN) 8 MG tablet Take 1 tablet (8 mg total) by mouth 2 (two) times daily as needed. Start on the third day after chemotherapy. 30 tablet 1  . oxyCODONE-acetaminophen (PERCOCET/ROXICET) 5-325 MG tablet Take 1 tablet by mouth every 6 (six) hours as needed for severe pain. 40 tablet 0  . prochlorperazine (COMPAZINE) 10 MG tablet Take 1 tablet (10 mg total) by mouth every 6 (six) hours as needed (Nausea or vomiting). 30 tablet 1  . sildenafil (REVATIO) 20 MG tablet 1-3 Tablet daily as needed ORAL 10 tablet 0  . sildenafil (VIAGRA) 50 MG tablet Take 50 mg  by mouth daily as needed for erectile dysfunction.    . sucralfate (CARAFATE) 1 g tablet Take 1 tablet (1 g total) by mouth 3 (three) times daily. Dissolve in 2 to 3 tbs warm water, swish &swallow 90 tablet 3  . UNIFINE PENTIPS 31G X 6 MM MISC 4 (four) times daily.     No current facility-administered medications for this visit.     Review of Systems:  GENERAL:  Feels good.  No fevers or sweats.  Weight up 1 pound. PERFORMANCE STATUS (ECOG):  1 HEENT:  Vision little blurry secondary to diabetes.  Left sided tinnitus, intermittent.  Throat better.  No runny nose, mouth sores or tenderness. Lungs: No shortness of breath or cough.  No hemoptysis. Cardiac:  No chest pain, palpitations, orthopnea, or PND.  Took BP pill 1 hour ago. GI:  Appetite 75%.  No nausea, vomiting, diarrhea, constipation, melena or hematochezia.  No prior colonoscopy. GU:  No urgency, frequency, dysuria, or hematuria.  Musculoskeletal:  No back pain.  Knee aches.  No muscle tenderness. Extremities:  No pain or swelling. Skin:  No rashes or skin changes. Neuro:  No headache, numbness or weakness, balance or coordination issues. Endocrine:  Diabetes..  No thyroid issues, hot flashes or night sweats. Psych:  No mood changes, depression or anxiety. Pain:  No pain. Review of systems:  All other systems reviewed and found to be negative.  Physical Exam: Blood pressure 129/84, pulse 94, temperature 98.2 F (36.8 C), temperature source Tympanic, resp. rate 18, weight 207 lb 10.8 oz (94.2 kg). GENERAL:  Well developed, well nourished, gentleman sitting comfortably in the exam room in no acute distress. MENTAL STATUS:  Alert and oriented to person, place and time. HEAD:  Short gray hair.  Goatee.  Normocephalic, atraumatic, face symmetric, no Cushingoid features. EYES:  Blue eyes.  Pupils equal round and reactive to light and accomodation.  No conjunctivitis or scleral icterus. ENT:  Oropharynx clear without lesion.  Tongue  normal. Mucous membranes moist.  RESPIRATORY:  Clear to auscultation without rales, wheezes or rhonchi. CARDIOVASCULAR:  Regular rate and rhythm without murmur, rub or gallop. ABDOMEN:  Soft, non-tender, with active bowel sounds, and no hepatosplenomegaly.  No masses. SKIN:  No rashes, ulcers or lesions. EXTREMITIES: No edema, no skin discoloration or tenderness.  No palpable cords. LYMPH NODES: Conglomerate left neck adenopathy 5 x 5.5 cm (improved).  No palpable supraclavicular, axillary or inguinal adenopathy  NEUROLOGICAL: Unremarkable. PSYCH:  Appropriate.   Appointment on 11/12/2015  Component Date Value Ref Range Status  . WBC 11/12/2015 6.5  3.8 - 10.6 K/uL Final  . RBC 11/12/2015  4.73  4.40 - 5.90 MIL/uL Final  . Hemoglobin 11/12/2015 14.2  13.0 - 18.0 g/dL Final  . HCT 11/12/2015 39.7* 40.0 - 52.0 % Final  . MCV 11/12/2015 84.1  80.0 - 100.0 fL Final  . MCH 11/12/2015 30.0  26.0 - 34.0 pg Final  . MCHC 11/12/2015 35.7  32.0 - 36.0 g/dL Final  . RDW 11/12/2015 14.6* 11.5 - 14.5 % Final  . Platelets 11/12/2015 173  150 - 440 K/uL Final  . Neutrophils Relative % 11/12/2015 77  % Final  . Neutro Abs 11/12/2015 5.0  1.4 - 6.5 K/uL Final  . Lymphocytes Relative 11/12/2015 11  % Final  . Lymphs Abs 11/12/2015 0.7* 1.0 - 3.6 K/uL Final  . Monocytes Relative 11/12/2015 12  % Final  . Monocytes Absolute 11/12/2015 0.8  0.2 - 1.0 K/uL Final  . Eosinophils Relative 11/12/2015 0  % Final  . Eosinophils Absolute 11/12/2015 0.0  0 - 0.7 K/uL Final  . Basophils Relative 11/12/2015 0  % Final  . Basophils Absolute 11/12/2015 0.0  0 - 0.1 K/uL Final  . Magnesium 11/12/2015 1.4* 1.7 - 2.4 mg/dL Final  . Sodium 11/12/2015 126* 135 - 145 mmol/L Final  . Potassium 11/12/2015 3.6  3.5 - 5.1 mmol/L Final  . Chloride 11/12/2015 90* 101 - 111 mmol/L Final  . CO2 11/12/2015 28  22 - 32 mmol/L Final  . Glucose, Bld 11/12/2015 160* 65 - 99 mg/dL Final  . BUN 11/12/2015 19  6 - 20 mg/dL Final  .  Creatinine, Ser 11/12/2015 0.88  0.61 - 1.24 mg/dL Final  . Calcium 11/12/2015 8.4* 8.9 - 10.3 mg/dL Final  . Total Protein 11/12/2015 6.5  6.5 - 8.1 g/dL Final  . Albumin 11/12/2015 3.8  3.5 - 5.0 g/dL Final  . AST 11/12/2015 17  15 - 41 U/L Final  . ALT 11/12/2015 24  17 - 63 U/L Final  . Alkaline Phosphatase 11/12/2015 81  38 - 126 U/L Final  . Total Bilirubin 11/12/2015 0.7  0.3 - 1.2 mg/dL Final  . GFR calc non Af Amer 11/12/2015 >60  >60 mL/min Final  . GFR calc Af Amer 11/12/2015 >60  >60 mL/min Final   Comment: (NOTE) The eGFR has been calculated using the CKD EPI equation. This calculation has not been validated in all clinical situations. eGFR's persistently <60 mL/min signify possible Chronic Kidney Disease.   . Anion gap 11/12/2015 8  5 - 15 Final    Assessment:  Kent [name] is a 60 y.o. male with clinical T2N3M0 base of tongue squamous cell carcinoma s/p biopsy on 09/10/2015.  Tumor was p16 IHC positive (high risk HPV).  CT soft tissue neck on 08/17/2015 revealed a slowly progressive over years, infiltrative, deep 4.1 x 5.4 cm LEFT parotid mass with widespread ipsilateral bulky adenopathy.  [facility] on 09/17/2015 revealed left base of tongue malignancy corresponding to asymmetric enhancement identified on the neck CT with hypermetabolic extensive conglomerate left cervical adenopathy (level II-IV) extending from the level of the angle of the mandible down inferiorly to the level of the cricoid cartilage.  There was clustered subcentimeter left level IB submandibular nodes with mild FDG activity.  There was no evidence of metastatic disease.  Alpha gal IgE was 1.21 (< 0.35) c/w IgE antibodies to galactose-alpha-1,3 galactose (oligosaccharide part on Fab portion of the cetuximab heavy chain) and suggestive of potential cetuximab reaction.  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  Pathology reveled a 19 cm GIST with mitotic  rate of 1/50 HPF (low grade).  Pathologic stage was T4NxM0.  He took imatinib for 1 year.  He is currently day 22 of cycle #1 cisplatin (10/22/2015).  He has had some left sided tinnitus.  Symptomatically, he is feeling better.  Pain is controlled.  Exam reveals conglomerate 5 x 5.5 cm left neck adenopathy  (improved).  He has worsening hyponatremia (126).  He has hypomagnesemia (1.4).  Plan: 1.  Labs today:  CBC with diff, CMP, Mg. 2.  Additional labs (blood and urine) to evaluate hyponatremia: serum and urine osmolality; UA, urine sodium, uric acid. 3.  Mg sulfate 2 gm IV today. 4.  Cycle #2 cisplatin today 5.  Discuss increasing fluids with electrolytes and added salt. 6.  Rx:  Percocet 5/325 1 tablet po q 6 hours prn pain; dis: # 40. 7.  RTC on 09/13 for (BMP, Mg) +/- IV Mg 8.  RTC in 1 week for MD assessment, labs (CBC with diff, BMP, Mg) +/- IVF + Mg   Lequita Asal, MD  11/12/2015, 9:31 AM

## 2015-11-12 NOTE — Progress Notes (Signed)
Patient states appetite is off somewhat, but if he eats very much he gets nauseated. Had a h/a this morning but it has resolved.

## 2015-11-13 ENCOUNTER — Ambulatory Visit
Admission: RE | Admit: 2015-11-13 | Discharge: 2015-11-13 | Disposition: A | Discharge status: Home / Self Care | Payer: Managed Care, Other (non HMO) | Source: Ambulatory Visit | Attending: Radiation Oncology | Admitting: Radiation Oncology

## 2015-11-13 ENCOUNTER — Other Ambulatory Visit: Payer: Self-pay | Admitting: *Deleted

## 2015-11-13 DIAGNOSIS — C01 Malignant neoplasm of base of tongue: Secondary | ICD-10-CM | POA: Diagnosis not present

## 2015-11-14 ENCOUNTER — Other Ambulatory Visit: Payer: Self-pay | Admitting: *Deleted

## 2015-11-14 ENCOUNTER — Inpatient Hospital Stay: Payer: Managed Care, Other (non HMO)

## 2015-11-14 ENCOUNTER — Telehealth: Payer: Self-pay | Admitting: Family Medicine

## 2015-11-14 ENCOUNTER — Telehealth: Payer: Self-pay | Admitting: *Deleted

## 2015-11-14 ENCOUNTER — Ambulatory Visit
Admission: RE | Admit: 2015-11-14 | Discharge: 2015-11-14 | Disposition: A | Discharge status: Home / Self Care | Payer: Managed Care, Other (non HMO) | Source: Ambulatory Visit | Attending: Radiation Oncology | Admitting: Radiation Oncology

## 2015-11-14 VITALS — BP 134/71 | HR 70 | Temp 97.2°F | Resp 20

## 2015-11-14 DIAGNOSIS — C01 Malignant neoplasm of base of tongue: Secondary | ICD-10-CM

## 2015-11-14 DIAGNOSIS — E871 Hypo-osmolality and hyponatremia: Secondary | ICD-10-CM

## 2015-11-14 LAB — BASIC METABOLIC PANEL
Anion gap: 12 (ref 5–15)
BUN: 35 mg/dL — ABNORMAL HIGH (ref 6–20)
CO2: 22 mmol/L (ref 22–32)
Calcium: 8.5 mg/dL — ABNORMAL LOW (ref 8.9–10.3)
Chloride: 89 mmol/L — ABNORMAL LOW (ref 101–111)
Creatinine, Ser: 1.2 mg/dL (ref 0.61–1.24)
GFR calc Af Amer: >60 mL/min (ref >60)
GFR calc non Af Amer: >60 mL/min (ref >60)
Glucose, Bld: 385 mg/dL — ABNORMAL HIGH (ref 65–99)
Potassium: 4.4 mmol/L (ref 3.5–5.1)
Sodium: 123 mmol/L — ABNORMAL LOW (ref 135–145)

## 2015-11-14 LAB — MAGNESIUM: Magnesium: 1.8 mg/dL (ref 1.7–2.4)

## 2015-11-14 MED ORDER — SODIUM CHLORIDE 0.9 % IV SOLN
Freq: Once | INTRAVENOUS | Status: AC
  Administered 2015-11-14: 13:00:00 via INTRAVENOUS
  Filled 2015-11-14: qty 1000

## 2015-11-14 MED ORDER — SODIUM CHLORIDE 0.9 % IV SOLN
INTRAVENOUS | Status: AC
  Filled 2015-11-14: qty 1000

## 2015-11-14 NOTE — Telephone Encounter (Signed)
See note

## 2015-11-14 NOTE — Telephone Encounter (Signed)
Phone call Discussed with oncology nurse. Patient with low sodium taking lisinopril hydrochlorothiazide and after discussion with nephrology feeling that hydrochlorothiazide is causing low sodium levels. This medication was stopped yesterday patient will probably need support from [place] for blood pressure. He is going back to oncology tomorrow will have blood work checking sodium levels if still low will possibly be admitted to the hospital otherwise will be followed up by them and if needed by [place] for blood pressure control.  Otherwise patient probably not doing well with his insulin blood sugars been 300.  Last A1c was dramatically elevated area and Patient will need follow-up here or consideration with endocrinology for management of diabetes.

## 2015-11-14 NOTE — Telephone Encounter (Signed)
Dr. Kem Parkinson nurse Judeen Hammans called. Needs a call back concerning a medication change and elevated sugar levels for the patient. Please call Judeen Hammans back ASAP! Thanks.  [phone]- contact #  Cheryl's patient

## 2015-11-14 NOTE — Telephone Encounter (Signed)
Called and spoke to wife that I know pt was on fo rposs. Magnesium and he did not need it, however his na dropped more form 2 days ago and dr Mike Gip spoke to kidney md and said to bring him in for IVF and then do not take lisinopril/HCTZ.  Also call PCP to ask for help with b/p med and monitoring as well as sugar 385 today and pt had not ate when he had labs done and also he did not take metformin 1000 mg today befor ecoming to cancer center.  Called [facility] and waiting for MD to call me back to give asst in this matter and pt will be called back to tell him what further to do.  Also pt has been made appt to com in tom 8:15 for labs, have radiation, see md and poss. Fluids and he is agreeable to the plan. This appt was given to pt by vicky in infusion.

## 2015-11-15 ENCOUNTER — Ambulatory Visit
Admission: RE | Admit: 2015-11-15 | Discharge: 2015-11-15 | Disposition: A | Discharge status: Home / Self Care | Payer: Managed Care, Other (non HMO) | Source: Ambulatory Visit | Attending: Radiation Oncology | Admitting: Radiation Oncology

## 2015-11-15 ENCOUNTER — Inpatient Hospital Stay (HOSPITAL_BASED_OUTPATIENT_CLINIC_OR_DEPARTMENT_OTHER): Payer: Managed Care, Other (non HMO) | Admitting: Hematology and Oncology

## 2015-11-15 ENCOUNTER — Encounter: Payer: Self-pay | Admitting: Hematology and Oncology

## 2015-11-15 ENCOUNTER — Inpatient Hospital Stay
Admission: AD | Admit: 2015-11-15 | Discharge: 2015-11-16 | DRG: 644 | Disposition: A | Discharge status: Home / Self Care | Payer: Managed Care, Other (non HMO) | Source: Ambulatory Visit | Attending: Internal Medicine | Admitting: Internal Medicine

## 2015-11-15 ENCOUNTER — Encounter: Payer: Self-pay | Admitting: Internal Medicine

## 2015-11-15 ENCOUNTER — Inpatient Hospital Stay: Payer: Managed Care, Other (non HMO)

## 2015-11-15 ENCOUNTER — Other Ambulatory Visit: Payer: Self-pay

## 2015-11-15 ENCOUNTER — Other Ambulatory Visit: Payer: Self-pay | Admitting: *Deleted

## 2015-11-15 VITALS — BP 144/90 | HR 62 | Temp 96.8°F | Resp 18 | Wt 205.0 lb

## 2015-11-15 DIAGNOSIS — Z8249 Family history of ischemic heart disease and other diseases of the circulatory system: Secondary | ICD-10-CM | POA: Diagnosis not present

## 2015-11-15 DIAGNOSIS — J029 Acute pharyngitis, unspecified: Secondary | ICD-10-CM

## 2015-11-15 DIAGNOSIS — Z809 Family history of malignant neoplasm, unspecified: Secondary | ICD-10-CM

## 2015-11-15 DIAGNOSIS — Z7984 Long term (current) use of oral hypoglycemic drugs: Secondary | ICD-10-CM

## 2015-11-15 DIAGNOSIS — I129 Hypertensive chronic kidney disease with stage 1 through stage 4 chronic kidney disease, or unspecified chronic kidney disease: Secondary | ICD-10-CM | POA: Diagnosis present

## 2015-11-15 DIAGNOSIS — R110 Nausea: Secondary | ICD-10-CM | POA: Diagnosis present

## 2015-11-15 DIAGNOSIS — Z5111 Encounter for antineoplastic chemotherapy: Secondary | ICD-10-CM | POA: Diagnosis not present

## 2015-11-15 DIAGNOSIS — Z79899 Other long term (current) drug therapy: Secondary | ICD-10-CM | POA: Diagnosis not present

## 2015-11-15 DIAGNOSIS — Z85028 Personal history of other malignant neoplasm of stomach: Secondary | ICD-10-CM

## 2015-11-15 DIAGNOSIS — E222 Syndrome of inappropriate secretion of antidiuretic hormone: Principal | ICD-10-CM | POA: Diagnosis present

## 2015-11-15 DIAGNOSIS — D649 Anemia, unspecified: Secondary | ICD-10-CM | POA: Diagnosis present

## 2015-11-15 DIAGNOSIS — E785 Hyperlipidemia, unspecified: Secondary | ICD-10-CM

## 2015-11-15 DIAGNOSIS — E871 Hypo-osmolality and hyponatremia: Secondary | ICD-10-CM

## 2015-11-15 DIAGNOSIS — Z794 Long term (current) use of insulin: Secondary | ICD-10-CM | POA: Diagnosis not present

## 2015-11-15 DIAGNOSIS — N189 Chronic kidney disease, unspecified: Secondary | ICD-10-CM | POA: Diagnosis present

## 2015-11-15 DIAGNOSIS — M129 Arthropathy, unspecified: Secondary | ICD-10-CM | POA: Diagnosis not present

## 2015-11-15 DIAGNOSIS — C3490 Malignant neoplasm of unspecified part of unspecified bronchus or lung: Secondary | ICD-10-CM | POA: Diagnosis present

## 2015-11-15 DIAGNOSIS — E1122 Type 2 diabetes mellitus with diabetic chronic kidney disease: Secondary | ICD-10-CM | POA: Diagnosis present

## 2015-11-15 DIAGNOSIS — C01 Malignant neoplasm of base of tongue: Secondary | ICD-10-CM

## 2015-11-15 DIAGNOSIS — M1990 Unspecified osteoarthritis, unspecified site: Secondary | ICD-10-CM | POA: Diagnosis present

## 2015-11-15 DIAGNOSIS — E119 Type 2 diabetes mellitus without complications: Secondary | ICD-10-CM | POA: Diagnosis not present

## 2015-11-15 DIAGNOSIS — Z9221 Personal history of antineoplastic chemotherapy: Secondary | ICD-10-CM | POA: Diagnosis not present

## 2015-11-15 DIAGNOSIS — I10 Essential (primary) hypertension: Secondary | ICD-10-CM | POA: Diagnosis not present

## 2015-11-15 DIAGNOSIS — Z800 Family history of malignant neoplasm of digestive organs: Secondary | ICD-10-CM | POA: Diagnosis not present

## 2015-11-15 DIAGNOSIS — R1310 Dysphagia, unspecified: Secondary | ICD-10-CM

## 2015-11-15 DIAGNOSIS — D696 Thrombocytopenia, unspecified: Secondary | ICD-10-CM | POA: Diagnosis not present

## 2015-11-15 DIAGNOSIS — E1165 Type 2 diabetes mellitus with hyperglycemia: Secondary | ICD-10-CM

## 2015-11-15 DIAGNOSIS — Z808 Family history of malignant neoplasm of other organs or systems: Secondary | ICD-10-CM

## 2015-11-15 DIAGNOSIS — T451X5A Adverse effect of antineoplastic and immunosuppressive drugs, initial encounter: Secondary | ICD-10-CM | POA: Diagnosis present

## 2015-11-15 DIAGNOSIS — E669 Obesity, unspecified: Secondary | ICD-10-CM | POA: Diagnosis not present

## 2015-11-15 DIAGNOSIS — E860 Dehydration: Secondary | ICD-10-CM | POA: Diagnosis present

## 2015-11-15 DIAGNOSIS — Z510 Encounter for antineoplastic radiation therapy: Secondary | ICD-10-CM | POA: Diagnosis not present

## 2015-11-15 HISTORY — DX: Malignant neoplasm of tongue, unspecified: C02.9

## 2015-11-15 LAB — CBC WITH DIFFERENTIAL/PLATELET
Basophils Absolute: 0 10*3/uL (ref 0–0.1)
Basophils Relative: 0 %
Eosinophils Absolute: 0 10*3/uL (ref 0–0.7)
Eosinophils Relative: 0 %
HCT: 37.7 % — ABNORMAL LOW (ref 40.0–52.0)
Hemoglobin: 13.4 g/dL (ref 13.0–18.0)
Lymphocytes Relative: 3 %
Lymphs Abs: 0.3 10*3/uL — ABNORMAL LOW (ref 1.0–3.6)
MCH: 29.9 pg (ref 26.0–34.0)
MCHC: 35.6 g/dL (ref 32.0–36.0)
MCV: 83.9 fL (ref 80.0–100.0)
Monocytes Absolute: 0.6 10*3/uL (ref 0.2–1.0)
Monocytes Relative: 6 %
Neutro Abs: 9.6 10*3/uL — ABNORMAL HIGH (ref 1.4–6.5)
Neutrophils Relative %: 91 %
Platelets: 169 10*3/uL (ref 150–440)
RBC: 4.49 MIL/uL (ref 4.40–5.90)
RDW: 14.9 % — ABNORMAL HIGH (ref 11.5–14.5)
WBC: 10.5 10*3/uL (ref 3.8–10.6)

## 2015-11-15 LAB — URINALYSIS COMPLETE WITH MICROSCOPIC (ARMC ONLY)
BACTERIA UA: NONE SEEN
BILIRUBIN URINE: NEGATIVE
Glucose, UA: 50 mg/dL — AB
Hgb urine dipstick: NEGATIVE
Ketones, ur: NEGATIVE mg/dL
LEUKOCYTES UA: NEGATIVE
Nitrite: NEGATIVE
PH: 6 (ref 5.0–8.0)
PROTEIN: NEGATIVE mg/dL
SQUAMOUS EPITHELIAL / LPF: NONE SEEN
Specific Gravity, Urine: 1.011 (ref 1.005–1.030)

## 2015-11-15 LAB — BASIC METABOLIC PANEL
Anion gap: 10 (ref 5–15)
BUN: 37 mg/dL — ABNORMAL HIGH (ref 6–20)
CO2: 22 mmol/L (ref 22–32)
Calcium: 8.3 mg/dL — ABNORMAL LOW (ref 8.9–10.3)
Chloride: 90 mmol/L — ABNORMAL LOW (ref 101–111)
Creatinine, Ser: 1.2 mg/dL (ref 0.61–1.24)
GFR calc Af Amer: >60 mL/min (ref >60)
GFR calc non Af Amer: >60 mL/min (ref >60)
Glucose, Bld: 249 mg/dL — ABNORMAL HIGH (ref 65–99)
Potassium: 4.3 mmol/L (ref 3.5–5.1)
Sodium: 122 mmol/L — ABNORMAL LOW (ref 135–145)

## 2015-11-15 LAB — GLUCOSE, CAPILLARY
GLUCOSE-CAPILLARY: 198 mg/dL — AB (ref 65–99)
GLUCOSE-CAPILLARY: 229 mg/dL — AB (ref 65–99)
Glucose-Capillary: 174 mg/dL — ABNORMAL HIGH (ref 65–99)

## 2015-11-15 LAB — TSH: TSH: 0.68 u[IU]/mL (ref 0.350–4.500)

## 2015-11-15 LAB — OSMOLALITY: OSMOLALITY: 277 mosm/kg (ref 275–295)

## 2015-11-15 LAB — OSMOLALITY, URINE: Osmolality, Ur: 437 mOsm/kg (ref 300–900)

## 2015-11-15 MED ORDER — PROCHLORPERAZINE MALEATE 10 MG PO TABS
10.0000 mg | ORAL_TABLET | Freq: Four times a day (QID) | ORAL | Status: DC | PRN
Start: 2015-11-15 — End: 2015-11-16

## 2015-11-15 MED ORDER — ONDANSETRON HCL 4 MG PO TABS
8.0000 mg | ORAL_TABLET | Freq: Two times a day (BID) | ORAL | Status: DC | PRN
  Administered 2015-11-15: 8 mg via ORAL
  Filled 2015-11-15: qty 2

## 2015-11-15 MED ORDER — CYCLOBENZAPRINE HCL 10 MG PO TABS
10.0000 mg | ORAL_TABLET | Freq: Every day | ORAL | Status: DC
  Administered 2015-11-15: 10 mg via ORAL
  Filled 2015-11-15: qty 1

## 2015-11-15 MED ORDER — SUCRALFATE 1 G PO TABS
1.0000 g | ORAL_TABLET | Freq: Three times a day (TID) | ORAL | Status: DC
  Administered 2015-11-15 – 2015-11-16 (×3): 1 g via ORAL
  Filled 2015-11-15 (×3): qty 1

## 2015-11-15 MED ORDER — LORAZEPAM 0.5 MG PO TABS: 0.5000 mg | ORAL_TABLET | Freq: Four times a day (QID) | ORAL | Status: DC | PRN

## 2015-11-15 MED ORDER — ATORVASTATIN CALCIUM 20 MG PO TABS
20.0000 mg | ORAL_TABLET | Freq: Every day | ORAL | Status: DC
  Administered 2015-11-15 – 2015-11-16 (×2): 20 mg via ORAL
  Filled 2015-11-15 (×2): qty 1

## 2015-11-15 MED ORDER — INSULIN GLARGINE 100 UNIT/ML ~~LOC~~ SOLN
36.0000 [IU] | Freq: Every day | SUBCUTANEOUS | Status: DC
  Administered 2015-11-15: 36 [IU] via SUBCUTANEOUS
  Filled 2015-11-15 (×2): qty 0.36

## 2015-11-15 MED ORDER — ENOXAPARIN SODIUM 40 MG/0.4ML ~~LOC~~ SOLN
40.0000 mg | SUBCUTANEOUS | Status: DC
  Administered 2015-11-15: 22:00:00 40 mg via SUBCUTANEOUS
  Filled 2015-11-15: qty 0.4

## 2015-11-15 MED ORDER — OXYCODONE-ACETAMINOPHEN 5-325 MG PO TABS: 1.0000 | ORAL_TABLET | Freq: Four times a day (QID) | ORAL | Status: DC | PRN

## 2015-11-15 MED ORDER — INSULIN GLARGINE 100 UNIT/ML SOLOSTAR PEN: 36.0000 [IU] | PEN_INJECTOR | Freq: Every day | SUBCUTANEOUS | Status: DC

## 2015-11-15 MED ORDER — MELOXICAM 15 MG PO TABS
15.0000 mg | ORAL_TABLET | Freq: Every day | ORAL | Status: DC
  Filled 2015-11-15 (×2): qty 1

## 2015-11-15 MED ORDER — INSULIN ASPART 100 UNIT/ML ~~LOC~~ SOLN
0.0000 [IU] | Freq: Three times a day (TID) | SUBCUTANEOUS | Status: DC
  Administered 2015-11-15: 18:00:00 3 [IU] via SUBCUTANEOUS
  Filled 2015-11-15: qty 3

## 2015-11-15 MED ORDER — SODIUM CHLORIDE 0.9 % IV SOLN
INTRAVENOUS | Status: DC
  Administered 2015-11-15 – 2015-11-16 (×2): via INTRAVENOUS

## 2015-11-15 MED ORDER — METFORMIN HCL 500 MG PO TABS
1000.0000 mg | ORAL_TABLET | Freq: Two times a day (BID) | ORAL | Status: DC
  Administered 2015-11-15 – 2015-11-16 (×2): 1000 mg via ORAL
  Filled 2015-11-15 (×2): qty 2

## 2015-11-15 MED ORDER — FLUCONAZOLE 100 MG PO TABS
100.0000 mg | ORAL_TABLET | Freq: Every day | ORAL | Status: DC
  Administered 2015-11-15 – 2015-11-16 (×2): 100 mg via ORAL
  Filled 2015-11-15 (×2): qty 1

## 2015-11-15 NOTE — H&P (Signed)
[facility] NAME: Kent [name]    MR#:  [id number]  DATE OF BIRTH:  12/26/1955  DATE OF ADMISSION:  11/15/2015  PRIMARY CARE PHYSICIAN: Kathrine Haddock, NP   REQUESTING/REFERRING PHYSICIAN:  Nolon Stalls  CHIEF COMPLAINT:   Hyponatremia  HISTORY OF PRESENT ILLNESS: Kent [name]  is a 60 y.o. male with a known history of diabetes type 2, hyperlipidemia, hypertension, tongue cancer stage IVB squamous carcinoma at the base of the tongue/left tonsil who recently underwent chemotherapy 4 days ago. Patient was noted to have sodium of 126. And was told to increase his fluids with electrolyte then added salt. He felt sick 2 days ago after the chemotherapy however he is feeling fine now. He is not having any vomiting or diarrhea. He went back for follow-up his sodium continues to be low. Now is decreased to 122. He denies any chest pain shortness of breath no fevers or chills. Patient does report drinking a lot of water.  PAST MEDICAL HISTORY:   Past Medical History:  Diagnosis Date  . Arthritis   . Diabetes mellitus without complication ([place])   . Hyperlipidemia   . Hypertension   . Obesity   . Stomach cancer ([place])    had tumor removed  . Tongue cancer ([place])     PAST SURGICAL HISTORY: Past Surgical History:  Procedure Laterality Date  . gastric tumor removed  2010    SOCIAL HISTORY:  Social History  Substance Use Topics  . Smoking status: Never Smoker  . Smokeless tobacco: Never Used  . Alcohol use No    FAMILY HISTORY:  Family History  Problem Relation Age of Onset  . Heart disease Mother   . Hypertension Father   . Cancer Sister     stomach  . Heart disease Brother     MI  . Cancer Daughter   . Cancer Son     testicular  . ADD / ADHD Sister     DRUG ALLERGIES: No Known Allergies  REVIEW OF SYSTEMS:   CONSTITUTIONAL: No fever, fatigue or weakness.  EYES: No blurred or double vision.  EARS, NOSE, AND  THROAT: No tinnitus or ear pain.  RESPIRATORY: No cough, shortness of breath, wheezing or hemoptysis.  CARDIOVASCULAR: No chest pain, orthopnea, edema.  GASTROINTESTINAL: No nausea, vomiting, diarrhea or abdominal pain.  GENITOURINARY: No dysuria, hematuria.  ENDOCRINE: No polyuria, nocturia,  HEMATOLOGY: No anemia, easy bruising or bleeding SKIN: No rash or lesion. MUSCULOSKELETAL: No joint pain or arthritis.   NEUROLOGIC: No tingling, numbness, weakness.  PSYCHIATRY: No anxiety or depression.   MEDICATIONS AT HOME:  Prior to Admission medications   Medication Sig Start Date End Date Taking? Authorizing Provider  blood glucose meter kit and supplies KIT Dispense based on patient and insurance preference. Use up to four times daily as directed. (FOR ICD-9 250.00, 250.01). 10/02/15  Yes Kathrine Haddock, NP  cyclobenzaprine (FLEXERIL) 10 MG tablet Take 1 tablet (10 mg total) by mouth at bedtime. 04/06/15  Yes Kathrine Haddock, NP  dexamethasone (DECADRON) 2 MG tablet Take 1 tablet (2 mg total) by mouth 2 (two) times daily. 11/01/15  Yes Noreene Filbert, MD  dexamethasone (DECADRON) 4 MG tablet Take 2 tablets by mouth once a day on the day after chemotherapy and then take 2 tablets two times a day for 2 days. Take with food. 10/22/15  Yes Lequita Asal, MD  fluconazole (DIFLUCAN) 100 MG tablet Take 1 tablet (100 mg  total) by mouth daily. 11/01/15  Yes Noreene Filbert, MD  Insulin Glargine (LANTUS) 100 UNIT/ML Solostar Pen Inject 36 Units into the skin daily at 10 pm. 04/06/15  Yes Kathrine Haddock, NP  lisinopril-hydrochlorothiazide (PRINZIDE,ZESTORETIC) 20-25 MG tablet Take 1 tablet by mouth daily. 04/06/15  Yes Kathrine Haddock, NP  meloxicam (MOBIC) 15 MG tablet Take 1 tablet (15 mg total) by mouth daily. 05/07/15  Yes Roderic Palau D Cuthriell, PA-C  metFORMIN (GLUCOPHAGE) 1000 MG tablet Take 1 tablet (1,000 mg total) by mouth 2 (two) times daily with a meal. 04/06/15  Yes Kathrine Haddock, NP  ondansetron (ZOFRAN) 8 MG  tablet Take 1 tablet (8 mg total) by mouth 2 (two) times daily as needed. Start on the third day after chemotherapy. 10/22/15  Yes Lequita Asal, MD  oxyCODONE-acetaminophen (PERCOCET/ROXICET) 5-325 MG tablet Take 1 tablet by mouth every 6 (six) hours as needed for severe pain. 11/12/15  Yes Lequita Asal, MD  prochlorperazine (COMPAZINE) 10 MG tablet Take 1 tablet (10 mg total) by mouth every 6 (six) hours as needed (Nausea or vomiting). 10/22/15  Yes Lequita Asal, MD  sildenafil (REVATIO) 20 MG tablet 1-3 Tablet daily as needed ORAL 10/12/15  Yes Volney American, PA-C  sildenafil (VIAGRA) 50 MG tablet Take 50 mg by mouth daily as needed for erectile dysfunction.   Yes Historical Provider, MD  UNIFINE PENTIPS 31G X 6 MM MISC 4 (four) times daily. 10/01/15  Yes Historical Provider, MD  atorvastatin (LIPITOR) 20 MG tablet Take 1 tablet (20 mg total) by mouth daily. Reported on 04/06/2015 Patient not taking: Reported on 11/15/2015 04/06/15   Kathrine Haddock, NP  LORazepam (ATIVAN) 0.5 MG tablet Take 1 tablet (0.5 mg total) by mouth every 6 (six) hours as needed (Nausea or vomiting). Patient not taking: Reported on 11/15/2015 10/22/15   Lequita Asal, MD  sucralfate (CARAFATE) 1 g tablet Take 1 tablet (1 g total) by mouth 3 (three) times daily. Dissolve in 2 to 3 tbs warm water, swish &swallow Patient not taking: Reported on 11/15/2015 11/01/15   Noreene Filbert, MD      PHYSICAL EXAMINATION:   VITAL SIGNS: There were no vitals taken for this visit.  GENERAL:  60 y.o.-year-old patient lying in the bed with no acute distress.  EYES: Pupils equal, round, reactive to light and accommodation. No scleral icterus. Extraocular muscles intact.  HEENT: Head atraumatic, normocephalic. Oropharynx and nasopharynx clear.  NECK:  Supple, no jugular venous distention. No thyroid enlargement, no tenderness.  LUNGS: Normal breath sounds bilaterally, no wheezing, rales,rhonchi or crepitation. No use of  accessory muscles of respiration.  CARDIOVASCULAR: S1, S2 normal. No murmurs, rubs, or gallops.  ABDOMEN: Soft, nontender, nondistended. Bowel sounds present. No organomegaly or mass.  EXTREMITIES: No pedal edema, cyanosis, or clubbing.  NEUROLOGIC: Cranial nerves II through XII are intact. Muscle strength 5/5 in all extremities. Sensation intact. Gait not checked.  PSYCHIATRIC: The patient is alert and oriented x 3.  SKIN: No obvious rash, lesion, or ulcer.   LABORATORY PANEL:   CBC  Recent Labs Lab 11/12/15 0853 11/15/15 0820  WBC 6.5 10.5  HGB 14.2 13.4  HCT 39.7* 37.7*  PLT 173 169  MCV 84.1 83.9  MCH 30.0 29.9  MCHC 35.7 35.6  RDW 14.6* 14.9*  LYMPHSABS 0.7* 0.3*  MONOABS 0.8 0.6  EOSABS 0.0 0.0  BASOSABS 0.0 0.0   ------------------------------------------------------------------------------------------------------------------  Chemistries   Recent Labs Lab 11/12/15 0853 11/14/15 0823 11/15/15 0907  NA 126* 123*  122*  K 3.6 4.4 4.3  CL 90* 89* 90*  CO2 28 22 22   GLUCOSE 160* 385* 249*  BUN 19 35* 37*  CREATININE 0.88 1.20 1.20  CALCIUM 8.4* 8.5* 8.3*  MG 1.4* 1.8  --   AST 17  --   --   ALT 24  --   --   ALKPHOS 81  --   --   BILITOT 0.7  --   --    ------------------------------------------------------------------------------------------------------------------ estimated creatinine clearance is 70.3 mL/min (by C-G formula based on SCr of 1.2 mg/dL). ------------------------------------------------------------------------------------------------------------------ No results for input(s): TSH, T4TOTAL, T3FREE, THYROIDAB in the last 72 hours.  Invalid input(s): FREET3   Coagulation profile No results for input(s): INR, PROTIME in the last 168 hours. ------------------------------------------------------------------------------------------------------------------- No results for input(s): DDIMER in the last 72  hours. -------------------------------------------------------------------------------------------------------------------  Cardiac Enzymes No results for input(s): CKMB, TROPONINI, MYOGLOBIN in the last 168 hours.  Invalid input(s): CK ------------------------------------------------------------------------------------------------------------------ Invalid input(s): POCBNP  ---------------------------------------------------------------------------------------------------------------  Urinalysis    Component Value Date/Time   COLORURINE YELLOW (A) 11/12/2015 0955   APPEARANCEUR CLEAR (A) 11/12/2015 0955   APPEARANCEUR Clear 02/14/2013 1441   LABSPEC 1.020 11/12/2015 0955   LABSPEC 1.020 02/14/2013 1441   PHURINE 5.0 11/12/2015 0955   GLUCOSEU NEGATIVE 11/12/2015 0955   GLUCOSEU 300 mg/dL 02/14/2013 1441   HGBUR NEGATIVE 11/12/2015 0955   BILIRUBINUR NEGATIVE 11/12/2015 0955   BILIRUBINUR Negative 02/14/2013 1441   [place] 11/12/2015 0955   PROTEINUR NEGATIVE 11/12/2015 0955   NITRITE NEGATIVE 11/12/2015 0955   LEUKOCYTESUR NEGATIVE 11/12/2015 0955   LEUKOCYTESUR Negative 02/14/2013 1441     RADIOLOGY: No results found.  EKG: Orders placed or performed in visit on 02/14/13  . EKG 12-Lead    IMPRESSION AND PLAN: Patient is a 60 year old with stage IV tongue cancer with hyponatremia  1. Hyponatremia Suspect could be due to excess free water ingestion We will give him normal saline I will restrict his fluid Check serum of all T and urine osmolality  2. Diabetes type 2 Place him on sliding scale insulin Continue metformin And Lantus  3. Hypertension he is on lisinopril HCTZ combination which has been stopped already Continue lisinopril  4. Hyperlipidemia unspecified continue atorvastatin  5. Stage IV lung cancer outpatient oncology follow-up  6. Miscellaneous Lovenox for DVT prophylaxis       All the records are reviewed and case discussed  with ED provider. Management plans discussed with the patient, family and they are in agreement.  CODE STATUS:    Code Status Orders        Start     Ordered   11/15/15 1219  Full code  Continuous     11/15/15 1219    Code Status History    Date Active Date Inactive Code Status Order ID Comments User Context   This patient has a current code status but no historical code status.       TOTAL TIME TAKING CARE OF THIS PATIENT: 25mnutes.    PDustin FlockM.D on 11/15/2015 at 12:28 PM  Between 7am to 6pm - Pager - [phone]  After 6pm go to [email or web address] - password [email or web address]  Office  [phone] CC: Primary care physician; CKathrine Haddock NP

## 2015-11-15 NOTE — Progress Notes (Signed)
[facility] day:  11/15/15  Chief Complaint: Kent [name] is a 60 y.o. 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  HPI:  The patient was last seen in the medical oncology clinic on 11/12/2015.  At that time, he received cycle #2 cisplatin.  Sodium was 126.  We discussed increasing fluids with electrolytes and added salt.  A work-up was done.  He states that he felt sick for 2 days after his chemotherapy.  Follow-up labs on 11/14/2015 revealed a sodium of 123, BUN was 35, Cr 1.2 (prechemo BUN 19 and Cr 0.88), and glucose 385.  He received 1 liter of normal saline.  His HCTZ/lisinopril was discontinued.  He followed up with his primary care physician regarding management of his blood sugar.  He has continued daily radiation.  He notes that his mass has decreased since 11/12/2015.  He no longer feels "sick".  He is drinking fluids with electrolytes and using extra salt.  His blood pressure is elevated today after not taking his HCTZ-lisinopril for the past 2 days.   Past Medical History:  Diagnosis Date  . Arthritis   . Diabetes mellitus without complication ([place])   . Hyperlipidemia   . Hypertension   . Obesity   . Stomach cancer [facility])    had tumor removed    Past Surgical History:  Procedure Laterality Date  . gastric tumor removed  2010    Family History  Problem Relation Age of Onset  . Heart disease Mother   . Hypertension Father   . Cancer Sister     stomach  . Heart disease Brother     MI  . Cancer Daughter   . Cancer Son     testicular  . ADD / ADHD Sister     Social History:  reports that he has never smoked. He has never used smokeless tobacco. He reports that he does not drink alcohol or use drugs.  He smokes a pipe and cigar rarely (once every 3 months).  He has a son and daughter.  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  He has planned (and paid for) a trip to the beach from 10/15/2015 - 10/20/2015.  He was no short term disability.  He has 10 weeks of vacation and sick leave.  The patient is alone today.  Allergies: No Known Allergies  Current Medications: No current outpatient prescriptions on file.   No current facility-administered medications for this visit.     Review of Systems:  GENERAL:  Feels better today.  No fevers or sweats.  Weight down 2 pounds since 11/12/2015. PERFORMANCE STATUS (ECOG):  1 HEENT:  Vision little blurry secondary to diabetes.  Intermittent soft tinnitus.  No runny nose, sore throat, mouth sores or tenderness. Lungs: No shortness of breath or cough.  No hemoptysis. Cardiac:  No chest pain, palpitations, orthopnea, or PND.  Took BP pill 1 hour ago. GI:  Appetite 75%.  No nausea, vomiting, diarrhea, constipation, melena or hematochezia.  No prior colonoscopy. GU:  No urgency, frequency, dysuria, or hematuria.  Musculoskeletal:  No back pain.  Knee aches.  No muscle tenderness. Extremities:  No pain or swelling. Skin:  No rashes or skin changes. Neuro:  No headache, numbness or weakness, balance or coordination issues. Endocrine:  Diabetes (blood sugar  high).  No thyroid issues, hot flashes or night sweats. Psych:  No mood changes, depression or anxiety. Pain:  Pain associated with adenopathy. Review of systems:  All other systems reviewed and found to be negative.  Physical Exam: Blood pressure (!) 144/90, pulse 62, temperature (!) 96.8 F (36 C), temperature source Tympanic, resp. rate 18, weight 205 lb 0.4 oz (93 kg). GENERAL:  Well developed, well nourished, gentleman sitting comfortably in the exam room in no acute distress. MENTAL STATUS:  Alert and oriented to person, place and time. HEAD:  Short gray hair.  Goatee.  Normocephalic, atraumatic, face symmetric, no Cushingoid features. EYES:  Blue eyes.  Pupils equal round  and reactive to light and accomodation.  No conjunctivitis or scleral icterus. ENT:  Oropharynx clear without lesion.  Tongue normal.  Mucous membranes moist.  RESPIRATORY:  Clear to auscultation without rales, wheezes or rhonchi. CARDIOVASCULAR:  Regular rate and rhythm without murmur, rub or gallop. ABDOMEN:  Soft, non-tender, with active bowel sounds, and no hepatosplenomegaly.  No masses. SKIN:  No rashes, ulcers or lesions. EXTREMITIES: No edema, no skin discoloration or tenderness.  No palpable cords. LYMPH NODES: Conglomerate left neck adenopathy soft (improved).  No palpable supraclavicular, axillary or inguinal adenopathy  NEUROLOGICAL: Unremarkable. PSYCH:  Appropriate.   Orders Only on 11/15/2015  Component Date Value Ref Range Status  . WBC 11/15/2015 10.5  3.8 - 10.6 K/uL Final  . RBC 11/15/2015 4.49  4.40 - 5.90 MIL/uL Final  . Hemoglobin 11/15/2015 13.4  13.0 - 18.0 g/dL Final  . HCT 11/15/2015 37.7* 40.0 - 52.0 % Final  . MCV 11/15/2015 83.9  80.0 - 100.0 fL Final  . MCH 11/15/2015 29.9  26.0 - 34.0 pg Final  . MCHC 11/15/2015 35.6  32.0 - 36.0 g/dL Final  . RDW 11/15/2015 14.9* 11.5 - 14.5 % Final  . Platelets 11/15/2015 169  150 - 440 K/uL Final  . Neutrophils Relative % 11/15/2015 91  % Final  . Neutro Abs 11/15/2015 9.6* 1.4 - 6.5 K/uL Final  . Lymphocytes Relative 11/15/2015 3  % Final  . Lymphs Abs 11/15/2015 0.3* 1.0 - 3.6 K/uL Final  . Monocytes Relative 11/15/2015 6  % Final  . Monocytes Absolute 11/15/2015 0.6  0.2 - 1.0 K/uL Final  . Eosinophils Relative 11/15/2015 0  % Final  . Eosinophils Absolute 11/15/2015 0.0  0 - 0.7 K/uL Final  . Basophils Relative 11/15/2015 0  % Final  . Basophils Absolute 11/15/2015 0.0  0 - 0.1 K/uL Final  Appointment on 11/15/2015  Component Date Value Ref Range Status  . Sodium 11/15/2015 122* 135 - 145 mmol/L Final  . Potassium 11/15/2015 4.3  3.5 - 5.1 mmol/L Final  . Chloride 11/15/2015 90* 101 - 111 mmol/L Final  . CO2  11/15/2015 22  22 - 32 mmol/L Final  . Glucose, Bld 11/15/2015 249* 65 - 99 mg/dL Final  . BUN 11/15/2015 37* 6 - 20 mg/dL Final  . Creatinine, Ser 11/15/2015 1.20  0.61 - 1.24 mg/dL Final  . Calcium 11/15/2015 8.3* 8.9 - 10.3 mg/dL Final  . GFR calc non Af Amer 11/15/2015 >60  >60 mL/min Final  . GFR calc Af Amer 11/15/2015 >60  >60 mL/min Final   Comment: (NOTE) The eGFR has been calculated using the CKD EPI equation. This calculation has not been validated in all clinical situations. eGFR's persistently <60 mL/min signify possible Chronic Kidney Disease.   . Anion gap 11/15/2015 10  5 - 15 Final  Appointment on 11/14/2015  Component Date Value Ref Range Status  . Sodium 11/14/2015 123* 135 - 145 mmol/L Final  . Potassium 11/14/2015 4.4  3.5 - 5.1 mmol/L Final  . Chloride 11/14/2015 89* 101 - 111 mmol/L Final  . CO2 11/14/2015 22  22 - 32 mmol/L Final  . Glucose, Bld 11/14/2015 385* 65 - 99 mg/dL Final  . BUN 11/14/2015 35* 6 - 20 mg/dL Final  . Creatinine, Ser 11/14/2015 1.20  0.61 - 1.24 mg/dL Final  . Calcium 11/14/2015 8.5* 8.9 - 10.3 mg/dL Final  . GFR calc non Af Amer 11/14/2015 >60  >60 mL/min Final  . GFR calc Af Amer 11/14/2015 >60  >60 mL/min Final   Comment: (NOTE) The eGFR has been calculated using the CKD EPI equation. This calculation has not been validated in all clinical situations. eGFR's persistently <60 mL/min signify possible Chronic Kidney Disease.   . Anion gap 11/14/2015 12  5 - 15 Final  . Magnesium 11/14/2015 1.8  1.7 - 2.4 mg/dL Final    Assessment:  Kent [name] is a 60 y.o. 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  Tumor was p16 IHC positive (high risk HPV).  CT soft tissue neck on 08/17/2015 revealed a slowly progressive over years, infiltrative, deep 4.1 x 5.4 cm LEFT parotid mass with widespread ipsilateral bulky adenopathy.  PET scan at [facility] on 09/17/2015 revealed left base of tongue  malignancy corresponding to asymmetric enhancement identified on the neck CT with hypermetabolic extensive conglomerate left cervical adenopathy (level II-IV) extending from the level of the angle of the mandible down inferiorly to the level of the cricoid cartilage.  There was clustered subcentimeter left level IB submandibular nodes with mild FDG activity.  There was no evidence of metastatic disease.  Alpha gal IgE was 1.21 (< 0.35) c/w IgE antibodies to galactose-alpha-1,3 galactose (oligosaccharide part on Fab portion of the cetuximab heavy chain) and suggestive of potential cetuximab reaction.  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  Pathologic stage was T4NxM0.  He took imatinib for 1 year.  He is currently day 4 of cycle #2 cisplatin (10/22/2015 - 11/12/2015) with concurrent radiation.  Left neck adenopathy has dramatically improved.  He has progressive hyponatremia (sodium 122) despite dietary changes, discontinuation of HCTZ-lisinopril, and IVF (NS) yesterday.  Symptomatically, he is feeling better today after 2 days of being sick.  Exam reveals decreasing left neck adenopathy.  Weight is down 2 pounds.  Blood sugar remains elevated.  Plan: 1.  Labs today:  CBC with diff, BMP. 2.  Discuss worsening hyponatremia.  Discuss plan for admission per nephrology recommendations.   3.  Discuss blood sugar issues.  Hyperglycemia exacerbated by Decadron (completes today).  4.  Admit to the hospital. 5.  Reschedule hearing test. 6.  RTC on 11/19/2015 for MD assess and labs (CBC with diff, BMP, Mg).   Lequita Asal, MD  11/15/2015, 12:02 PM

## 2015-11-15 NOTE — Progress Notes (Signed)
BP 144/90 HR 62.  Patient did not take his BP medication this morning per Judeen Hammans.  Retake 144/94 HR 65.

## 2015-11-15 NOTE — Progress Notes (Signed)
Pt na level no better than yest. And his b/p is elevated due to pt being told not to take lisinopril/hctz.  Pt will be admitted to room 122. Contacted wife to let her know room 122. Called report to Gerald Stabs that pt had cisplatin 9/11 and sodium level been dec. And yest. Pt had to come and get fluids per rec: of nephrology. And then today the sodium is still dropped and b/p up pt wil lbe admitted.  The infusion nurses started IV in left antecubital for the floor. Pt trf to oncology floor by volunteer

## 2015-11-16 ENCOUNTER — Ambulatory Visit: Payer: Managed Care, Other (non HMO)

## 2015-11-16 DIAGNOSIS — D649 Anemia, unspecified: Secondary | ICD-10-CM

## 2015-11-16 DIAGNOSIS — R1310 Dysphagia, unspecified: Secondary | ICD-10-CM

## 2015-11-16 DIAGNOSIS — E860 Dehydration: Secondary | ICD-10-CM

## 2015-11-16 DIAGNOSIS — D696 Thrombocytopenia, unspecified: Secondary | ICD-10-CM

## 2015-11-16 LAB — BASIC METABOLIC PANEL
ANION GAP: 8 (ref 5–15)
BUN: 30 mg/dL — AB (ref 6–20)
CO2: 26 mmol/L (ref 22–32)
Calcium: 8.1 mg/dL — ABNORMAL LOW (ref 8.9–10.3)
Chloride: 98 mmol/L — ABNORMAL LOW (ref 101–111)
Creatinine, Ser: 1.02 mg/dL (ref 0.61–1.24)
GFR calc Af Amer: >60 mL/min (ref >60)
Glucose, Bld: 106 mg/dL — ABNORMAL HIGH (ref 65–99)
POTASSIUM: 3.5 mmol/L (ref 3.5–5.1)
SODIUM: 132 mmol/L — AB (ref 135–145)

## 2015-11-16 LAB — OSMOLALITY, URINE: Osmolality, Ur: 280 mOsm/kg — ABNORMAL LOW (ref 300–900)

## 2015-11-16 LAB — GLUCOSE, CAPILLARY
GLUCOSE-CAPILLARY: 74 mg/dL (ref 65–99)
GLUCOSE-CAPILLARY: 92 mg/dL (ref 65–99)
Glucose-Capillary: 92 mg/dL (ref 65–99)

## 2015-11-16 LAB — CBC
HEMATOCRIT: 34.7 % — AB (ref 40.0–52.0)
HEMOGLOBIN: 12.8 g/dL — AB (ref 13.0–18.0)
MCH: 30.4 pg (ref 26.0–34.0)
MCHC: 36.9 g/dL — ABNORMAL HIGH (ref 32.0–36.0)
MCV: 82.5 fL (ref 80.0–100.0)
Platelets: 122 10*3/uL — ABNORMAL LOW (ref 150–440)
RBC: 4.21 MIL/uL — AB (ref 4.40–5.90)
RDW: 15 % — AB (ref 11.5–14.5)
WBC: 8.9 10*3/uL (ref 3.8–10.6)

## 2015-11-16 LAB — SODIUM, URINE, RANDOM: SODIUM UR: 74 mmol/L

## 2015-11-16 LAB — SODIUM: SODIUM: 131 mmol/L — AB (ref 135–145)

## 2015-11-16 MED ORDER — CEPASTAT 14.5 MG MT LOZG: 1.0000 | LOZENGE | OROMUCOSAL | Status: DC | PRN

## 2015-11-16 MED ORDER — MENTHOL 3 MG MT LOZG: 1.0000 | LOZENGE | OROMUCOSAL | 12 refills | Status: DC | PRN

## 2015-11-16 MED ORDER — MENTHOL 3 MG MT LOZG
1.0000 | LOZENGE | OROMUCOSAL | Status: DC | PRN
  Filled 2015-11-16: qty 9

## 2015-11-16 MED ORDER — MENTHOL 3 MG MT LOZG
1.0000 | LOZENGE | OROMUCOSAL | Status: DC | PRN
  Administered 2015-11-16 (×2): 3 mg via ORAL
  Filled 2015-11-16: qty 9

## 2015-11-16 MED ORDER — LISINOPRIL 20 MG PO TABS: 20.0000 mg | ORAL_TABLET | Freq: Every day | ORAL | 5 refills | Status: DC

## 2015-11-16 NOTE — Progress Notes (Signed)
Discharge paperwork reviewed with patient who verbalized understanding. Prescriptions sent electronically. Patient to transport self home.

## 2015-11-16 NOTE — Discharge Summary (Signed)
[name]    MR#:  [id number]  DATE OF BIRTH:  [birth year]  DATE OF ADMISSION:  11/15/2015 ADMITTING PHYSICIAN: Bettey Costa, MD  DATE OF DISCHARGE: No discharge date for patient encounter.  PRIMARY CARE PHYSICIAN: Kathrine Haddock, NP     ADMISSION DIAGNOSIS:  Head and neck cancer  hypertension Hyponatremia  DISCHARGE DIAGNOSIS:  Active Problems:   Hyponatremia   Dehydration   Odynophagia   Anemia   Thrombocytopenia (HCC)   SECONDARY DIAGNOSIS:   Past Medical History:  Diagnosis Date  . Arthritis   . Diabetes mellitus without complication ([place])   . Hyperlipidemia   . Hypertension   . Obesity   . Stomach cancer ([place])    had tumor removed  . Tongue cancer ([place])     .pro HOSPITAL COURSE:   The patient is 60 year old Caucasian male with past medical history significant for history of diabetes, hypertension, hyperlipidemia, stomach cancer, tongue cancer, presents to the hospital with complaints of hyponatremia noted on the labs, sodium level as low as 126. Patient was seen by his primary oncologist, who recommended to increase oral intake, then he added salt, patient complained of feeling sick, recheck labs showed worsening sodium level of 122, patient was sent to the hospital for evaluation and treatment. He was initiated on the normal saline solution, his sodium level improved to 132. The patient admitted to drinking plenty of water . Urine osmolarity on arrival was high at 782, urine sodium was 85 , concerning for dehydration. Patient was rehydrated and was felt to be stable to be discharged home. His oral intake has improved, he was able to eat 100% of offered meals. Discussion by problem: #1. Hyponatremia, due to dehydration , improved with IV fluid administration, follow closely as outpatient . Repeated sodium level has decreased, likely SIADH, fluid restriction would be beneficial for him as  outpatient   #2. Nausea, resolved, likely chemotherapy related #3 anemia of his rehydration, with a hemoglobin level as outpatient, no bleeding was noted #4. Thrombocytopenia, follow platelet count as outpatient as well, in no bleeding  DISCHARGE CONDITIONS:   Stable  CONSULTS OBTAINED:    DRUG ALLERGIES:  No Known Allergies  DISCHARGE MEDICATIONS:   Current Discharge Medication List    START taking these medications   Details  lisinopril (PRINIVIL,ZESTRIL) 20 MG tablet Take 1 tablet (20 mg total) by mouth daily. Qty: 30 tablet, Refills: 5    menthol-cetylpyridinium (CEPACOL) 3 MG lozenge Take 1 lozenge (3 mg total) by mouth as needed for sore throat. Qty: 100 tablet, Refills: 12      CONTINUE these medications which have NOT CHANGED   Details  blood glucose meter kit and supplies KIT Dispense based on patient and insurance preference. Use up to four times daily as directed. (FOR ICD-9 250.00, 250.01). Qty: 1 each, Refills: 12   Associated Diagnoses: Type 2 diabetes mellitus with diabetic chronic kidney disease, unspecified CKD stage, unspecified long term insulin use status (HCC)    cyclobenzaprine (FLEXERIL) 10 MG tablet Take 1 tablet (10 mg total) by mouth at bedtime. Qty: 30 tablet, Refills: 2    !! dexamethasone (DECADRON) 2 MG tablet Take 1 tablet (2 mg total) by mouth 2 (two) times daily. Qty: 20 tablet, Refills: 0    !! dexamethasone (DECADRON) 4 MG tablet Take 2 tablets by mouth once a day on the day after chemotherapy and then take 2 tablets two times a  day for 2 days. Take with food. Qty: 30 tablet, Refills: 1   Associated Diagnoses: Carcinoma of base of tongue (HCC)    fluconazole (DIFLUCAN) 100 MG tablet Take 1 tablet (100 mg total) by mouth daily. Qty: 5 tablet, Refills: 0    Insulin Glargine (LANTUS) 100 UNIT/ML Solostar Pen Inject 36 Units into the skin daily at 10 pm. Qty: 4 pen, Refills: 3    meloxicam (MOBIC) 15 MG tablet Take 1 tablet (15 mg  total) by mouth daily. Qty: 30 tablet, Refills: 0    metFORMIN (GLUCOPHAGE) 1000 MG tablet Take 1 tablet (1,000 mg total) by mouth 2 (two) times daily with a meal. Qty: 180 tablet, Refills: 3    ondansetron (ZOFRAN) 8 MG tablet Take 1 tablet (8 mg total) by mouth 2 (two) times daily as needed. Start on the third day after chemotherapy. Qty: 30 tablet, Refills: 1   Associated Diagnoses: Carcinoma of base of tongue (HCC)    oxyCODONE-acetaminophen (PERCOCET/ROXICET) 5-325 MG tablet Take 1 tablet by mouth every 6 (six) hours as needed for severe pain. Qty: 40 tablet, Refills: 0    prochlorperazine (COMPAZINE) 10 MG tablet Take 1 tablet (10 mg total) by mouth every 6 (six) hours as needed (Nausea or vomiting). Qty: 30 tablet, Refills: 1   Associated Diagnoses: Carcinoma of base of tongue (HCC)    sildenafil (REVATIO) 20 MG tablet 1-3 Tablet daily as needed ORAL Qty: 10 tablet, Refills: 0    sildenafil (VIAGRA) 50 MG tablet Take 50 mg by mouth daily as needed for erectile dysfunction.    UNIFINE PENTIPS 31G X 6 MM MISC 4 (four) times daily.    atorvastatin (LIPITOR) 20 MG tablet Take 1 tablet (20 mg total) by mouth daily. Reported on 04/06/2015 Qty: 90 tablet, Refills: 1    LORazepam (ATIVAN) 0.5 MG tablet Take 1 tablet (0.5 mg total) by mouth every 6 (six) hours as needed (Nausea or vomiting). Qty: 30 tablet, Refills: 0   Associated Diagnoses: Carcinoma of base of tongue (HCC)    sucralfate (CARAFATE) 1 g tablet Take 1 tablet (1 g total) by mouth 3 (three) times daily. Dissolve in 2 to 3 tbs warm water, swish &swallow Qty: 90 tablet, Refills: 3     !! - Potential duplicate medications found. Please discuss with provider.    STOP taking these medications     lisinopril-hydrochlorothiazide (PRINZIDE,ZESTORETIC) 20-25 MG tablet          DISCHARGE INSTRUCTIONS:    Patient is to follow-up with primary care physician, primary oncologist as outpatient  If you experience worsening  of your admission symptoms, develop shortness of breath, life threatening emergency, suicidal or homicidal thoughts you must seek medical attention immediately by calling 911 or calling your MD immediately  if symptoms less severe.  You Must read complete instructions/literature along with all the possible adverse reactions/side effects for all the Medicines you take and that have been prescribed to you. Take any new Medicines after you have completely understood and accept all the possible adverse reactions/side effects.   Please note  You were cared for by a hospitalist during your hospital stay. If you have any questions about your discharge medications or the care you received while you were in the hospital after you are discharged, you can call the unit and asked to speak with the hospitalist on call if the hospitalist that took care of you is not available. Once you are discharged, your primary care physician will handle any further  medical issues. Please note that NO REFILLS for any discharge medications will be authorized once you are discharged, as it is imperative that you return to your primary care physician (or establish a relationship with a primary care physician if you do not have one) for your aftercare needs so that they can reassess your need for medications and monitor your lab values.    Today   CHIEF COMPLAINT:  No chief complaint on file.   HISTORY OF PRESENT ILLNESS:  Kent [name]  is a 60 y.o. male with a known history of diabetes, hypertension, hyperlipidemia, stomach cancer, tongue cancer, presents to the hospital with complaints of hyponatremia noted on the labs, sodium level as low as 126. Patient was seen by his primary oncologist, who recommended to increase oral intake, then he added salt, patient complained of feeling sick, recheck labs showed worsening sodium level of 122, patient was sent to the hospital for evaluation and treatment. He was initiated on the normal  saline solution, his sodium level improved to 132. The patient admitted to drinking plenty of water . Urine osmolarity on arrival was high at 782, urine sodium was 85 , concerning for dehydration. Patient was rehydrated and was felt to be stable to be discharged home. His oral intake has improved, he was able to eat 100% of offered meals. Discussion by problem: #1. Hyponatremia, due to dehydration , improved with IV fluid administration, follow closely as outpatient Repeated sodium level has decreased, likely SIADH, fluid restriction would be beneficial for him as outpatient  #2. Nausea, resolved, likely chemotherapy related #3 anemia of his rehydration, with a hemoglobin level as outpatient, no bleeding was noted #4. Thrombocytopenia, follow platelet count as outpatient as well, in no bleeding    VITAL SIGNS:  Blood pressure (!) 129/99, pulse 82, temperature 98 F (36.7 C), temperature source Oral, resp. rate 18, height 5' 7"  (1.702 m), weight 91.9 kg (202 lb 11.2 oz), SpO2 96 %.  I/O:    Intake/Output Summary (Last 24 hours) at 11/16/15 1614 Last data filed at 11/16/15 1424  Gross per 24 hour  Intake             2153 ml  Output             2675 ml  Net             -522 ml    PHYSICAL EXAMINATION:  GENERAL:  60 y.o.-year-old patient lying in the bed with no acute distress.  EYES: Pupils equal, round, reactive to light and accommodation. No scleral icterus. Extraocular muscles intact.  HEENT: Head atraumatic, normocephalic. Oropharynx and nasopharynx clear.  NECK:  Supple, no jugular venous distention. No thyroid enlargement, no tenderness.  LUNGS: Normal breath sounds bilaterally, no wheezing, rales,rhonchi or crepitation. No use of accessory muscles of respiration.  CARDIOVASCULAR: S1, S2 normal. No murmurs, rubs, or gallops.  ABDOMEN: Soft, non-tender, non-distended. Bowel sounds present. No organomegaly or mass.  EXTREMITIES: No pedal edema, cyanosis, or clubbing.  NEUROLOGIC:  Cranial nerves II through XII are intact. Muscle strength 5/5 in all extremities. Sensation intact. Gait not checked.  PSYCHIATRIC: The patient is alert and oriented x 3.  SKIN: No obvious rash, lesion, or ulcer.   DATA REVIEW:   CBC  Recent Labs Lab 11/16/15 0539  WBC 8.9  HGB 12.8*  HCT 34.7*  PLT 122*    Chemistries   Recent Labs Lab 11/12/15 0853 11/14/15 0823  11/16/15 0539 11/16/15 1526  NA 126* 123*  < >  132* 131*  K 3.6 4.4  < > 3.5  --   CL 90* 89*  < > 98*  --   CO2 28 22  < > 26  --   GLUCOSE 160* 385*  < > 106*  --   BUN 19 35*  < > 30*  --   CREATININE 0.88 1.20  < > 1.02  --   CALCIUM 8.4* 8.5*  < > 8.1*  --   MG 1.4* 1.8  --   --   --   AST 17  --   --   --   --   ALT 24  --   --   --   --   ALKPHOS 81  --   --   --   --   BILITOT 0.7  --   --   --   --   < > = values in this interval not displayed.  Cardiac Enzymes No results for input(s): TROPONINI in the last 168 hours.  Microbiology Results  No results found for this or any previous visit.  RADIOLOGY:  No results found.  EKG:   Orders placed or performed in visit on 02/14/13  . EKG 12-Lead      Management plans discussed with the patient, family and they are in agreement.  CODE STATUS:     Code Status Orders        Start     Ordered   11/15/15 1219  Full code  Continuous     11/15/15 1219    Code Status History    Date Active Date Inactive Code Status Order ID Comments User Context   This patient has a current code status but no historical code status.      TOTAL TIME TAKING CARE OF THIS PATIENT: 40 minutes.    Theodoro Grist M.D on 11/16/2015 at 4:14 PM  Between 7am to 6pm - Pager - [phone]  After 6pm go to [email or web address] - password [email or web address] [facility]  Office  [phone]  CC: Primary care physician; Kathrine Haddock, NP

## 2015-11-18 ENCOUNTER — Encounter: Payer: Self-pay | Admitting: Hematology and Oncology

## 2015-11-19 ENCOUNTER — Ambulatory Visit: Payer: Managed Care, Other (non HMO)

## 2015-11-19 ENCOUNTER — Telehealth: Payer: Self-pay | Admitting: Unknown Physician Specialty

## 2015-11-19 ENCOUNTER — Other Ambulatory Visit: Payer: Self-pay | Admitting: *Deleted

## 2015-11-19 DIAGNOSIS — C01 Malignant neoplasm of base of tongue: Secondary | ICD-10-CM

## 2015-11-19 MED ORDER — DEXAMETHASONE 2 MG PO TABS: 2.0000 mg | ORAL_TABLET | Freq: Two times a day (BID) | ORAL | 0 refills | Status: DC

## 2015-11-19 NOTE — Telephone Encounter (Signed)
Kent [name] stated that the cancer center told her that the pts BP medication needed to be changed to one that has no diuretic because it is causing his sodium to drop. He took the last one Wed and went to treatment on Thurs and was admitted from there and has not had a pill since.

## 2015-11-19 NOTE — Telephone Encounter (Signed)
Cheryl please refer to the phone note from Dr. Jeananne Rama with this patient. I just want to make sure I am right before I call back. According to medication list, patient was taken off of the diuretic (was on lisinopril/hctz) and switched to just lisinopril. Is this correct?

## 2015-11-19 NOTE — Telephone Encounter (Signed)
Called and spoke to [place]. I let her know that Dr. Jeananne Rama spoke with the oncology nurse at the end of last week about this situation. I let her know that the doctor at the hospital sent the patient in some plain lisinopril without the diuretic on Friday. Patient's wife apologized for the confusion.

## 2015-11-19 NOTE — Telephone Encounter (Signed)
Pt was in the hospital and changes were made

## 2015-11-20 ENCOUNTER — Inpatient Hospital Stay (HOSPITAL_BASED_OUTPATIENT_CLINIC_OR_DEPARTMENT_OTHER): Payer: Managed Care, Other (non HMO) | Admitting: Hematology and Oncology

## 2015-11-20 ENCOUNTER — Other Ambulatory Visit: Payer: Self-pay | Admitting: *Deleted

## 2015-11-20 ENCOUNTER — Ambulatory Visit
Admission: RE | Admit: 2015-11-20 | Discharge: 2015-11-20 | Disposition: A | Discharge status: Home / Self Care | Payer: Managed Care, Other (non HMO) | Source: Ambulatory Visit | Attending: Radiation Oncology | Admitting: Radiation Oncology

## 2015-11-20 ENCOUNTER — Encounter: Payer: Self-pay | Admitting: Hematology and Oncology

## 2015-11-20 ENCOUNTER — Inpatient Hospital Stay: Payer: Managed Care, Other (non HMO)

## 2015-11-20 ENCOUNTER — Other Ambulatory Visit: Payer: Self-pay

## 2015-11-20 VITALS — BP 135/92 | HR 80 | Temp 96.0°F | Resp 18 | Wt 193.3 lb

## 2015-11-20 DIAGNOSIS — E785 Hyperlipidemia, unspecified: Secondary | ICD-10-CM

## 2015-11-20 DIAGNOSIS — Z85028 Personal history of other malignant neoplasm of stomach: Secondary | ICD-10-CM | POA: Diagnosis not present

## 2015-11-20 DIAGNOSIS — E119 Type 2 diabetes mellitus without complications: Secondary | ICD-10-CM | POA: Diagnosis not present

## 2015-11-20 DIAGNOSIS — Z800 Family history of malignant neoplasm of digestive organs: Secondary | ICD-10-CM

## 2015-11-20 DIAGNOSIS — Z794 Long term (current) use of insulin: Secondary | ICD-10-CM | POA: Diagnosis not present

## 2015-11-20 DIAGNOSIS — Z510 Encounter for antineoplastic radiation therapy: Secondary | ICD-10-CM | POA: Diagnosis not present

## 2015-11-20 DIAGNOSIS — E871 Hypo-osmolality and hyponatremia: Secondary | ICD-10-CM | POA: Diagnosis not present

## 2015-11-20 DIAGNOSIS — Z808 Family history of malignant neoplasm of other organs or systems: Secondary | ICD-10-CM

## 2015-11-20 DIAGNOSIS — E1165 Type 2 diabetes mellitus with hyperglycemia: Secondary | ICD-10-CM

## 2015-11-20 DIAGNOSIS — C01 Malignant neoplasm of base of tongue: Secondary | ICD-10-CM

## 2015-11-20 DIAGNOSIS — R1310 Dysphagia, unspecified: Secondary | ICD-10-CM

## 2015-11-20 DIAGNOSIS — Z7984 Long term (current) use of oral hypoglycemic drugs: Secondary | ICD-10-CM

## 2015-11-20 DIAGNOSIS — Z79899 Other long term (current) drug therapy: Secondary | ICD-10-CM

## 2015-11-20 DIAGNOSIS — M129 Arthropathy, unspecified: Secondary | ICD-10-CM | POA: Diagnosis not present

## 2015-11-20 DIAGNOSIS — Z809 Family history of malignant neoplasm, unspecified: Secondary | ICD-10-CM | POA: Diagnosis not present

## 2015-11-20 DIAGNOSIS — E669 Obesity, unspecified: Secondary | ICD-10-CM

## 2015-11-20 DIAGNOSIS — Z5111 Encounter for antineoplastic chemotherapy: Secondary | ICD-10-CM | POA: Diagnosis not present

## 2015-11-20 DIAGNOSIS — I10 Essential (primary) hypertension: Secondary | ICD-10-CM

## 2015-11-20 DIAGNOSIS — J029 Acute pharyngitis, unspecified: Secondary | ICD-10-CM

## 2015-11-20 DIAGNOSIS — D696 Thrombocytopenia, unspecified: Secondary | ICD-10-CM | POA: Diagnosis not present

## 2015-11-20 LAB — BASIC METABOLIC PANEL
Anion gap: 9 (ref 5–15)
BUN: 28 mg/dL — ABNORMAL HIGH (ref 6–20)
CO2: 26 mmol/L (ref 22–32)
Calcium: 8.6 mg/dL — ABNORMAL LOW (ref 8.9–10.3)
Chloride: 92 mmol/L — ABNORMAL LOW (ref 101–111)
Creatinine, Ser: 1.22 mg/dL (ref 0.61–1.24)
GFR calc Af Amer: >60 mL/min (ref >60)
GFR calc non Af Amer: >60 mL/min (ref >60)
Glucose, Bld: 142 mg/dL — ABNORMAL HIGH (ref 65–99)
Potassium: 3.9 mmol/L (ref 3.5–5.1)
Sodium: 127 mmol/L — ABNORMAL LOW (ref 135–145)

## 2015-11-20 LAB — MAGNESIUM: Magnesium: 1.4 mg/dL — ABNORMAL LOW (ref 1.7–2.4)

## 2015-11-20 MED ORDER — SODIUM CHLORIDE 0.9 % IV SOLN
Freq: Once | INTRAVENOUS | Status: AC
  Administered 2015-11-20: 11:00:00 via INTRAVENOUS
  Filled 2015-11-20: qty 1000

## 2015-11-20 MED ORDER — SODIUM CHLORIDE 0.9 % IV SOLN
INTRAVENOUS | Status: DC
  Administered 2015-11-20: 11:00:00 via INTRAVENOUS
  Filled 2015-11-20: qty 1000

## 2015-11-20 MED ORDER — MAGNESIUM SULFATE 2 GM/50ML IV SOLN
2.0000 g | Freq: Once | INTRAVENOUS | Status: AC
  Administered 2015-11-20: 2 g via INTRAVENOUS
  Filled 2015-11-20: qty 50

## 2015-11-20 MED ORDER — HEPARIN SOD (PORK) LOCK FLUSH 10 UNIT/ML IV SOLN
10.0000 [IU] | Freq: Once | INTRAVENOUS | Status: AC
  Administered 2015-11-20: 10 [IU] via INTRAVENOUS
  Filled 2015-11-20: qty 1

## 2015-11-20 MED ORDER — SODIUM CHLORIDE 0.9 % IJ SOLN
10.0000 mL | Freq: Once | INTRAMUSCULAR | Status: AC
  Administered 2015-11-20: 10 mL via INTRAVENOUS
  Filled 2015-11-20: qty 10

## 2015-11-20 MED ORDER — PROCHLORPERAZINE MALEATE 10 MG PO TABS: 10.0000 mg | ORAL_TABLET | Freq: Four times a day (QID) | ORAL | 1 refills | Status: DC | PRN

## 2015-11-20 NOTE — Progress Notes (Signed)
[place] [facility] day:  11/20/15  Chief Complaint: Kent [name] is a 60 y.o. 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  HPI:  The patient was last seen in the medical oncology clinic on 11/15/2015.  At that time, his sodium had dropped to 122 despite discontinuation of lisinopril-HCTZ.  He was admitted to [facility] from 11/15/2015 - 0915/2017.  He received IVF of NS.  Sodium on discharge was 131.  He continues radiation.  He notes that solid foods aren't going down. He notes a hard time swallowing.  Cold liquids and food bother him.  His blood sugar has been good. He has not had his hearing test.   Past Medical History:  Diagnosis Date  . Arthritis   . Diabetes mellitus without complication ([place])   . Hyperlipidemia   . Hypertension   . Obesity   . Stomach cancer ([place])    had tumor removed  . Tongue cancer [facility])     Past Surgical History:  Procedure Laterality Date  . gastric tumor removed  2010    Family History  Problem Relation Age of Onset  . Heart disease Mother   . Hypertension Father   . Cancer Sister     stomach  . Heart disease Brother     MI  . Cancer Daughter   . Cancer Son     testicular  . ADD / ADHD Sister     Social History:  reports that he has never smoked. He has never used smokeless tobacco. He reports that he does not drink alcohol or use drugs.  He smokes a pipe and cigar rarely (once every 3 months).  He has a son and daughter.  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  He has planned (and paid for) a trip to the beach from 10/15/2015 - 10/20/2015.  He was no short term disability.  He has 10 weeks of vacation and sick leave.    Allergies: No Known Allergies  Current Medications: Current Outpatient Prescriptions  Medication Sig Dispense Refill  . atorvastatin (LIPITOR)  20 MG tablet Take 1 tablet (20 mg total) by mouth daily. Reported on 04/06/2015 90 tablet 1  . blood glucose meter kit and supplies KIT Dispense based on patient and insurance preference. Use up to four times daily as directed. (FOR ICD-9 250.00, 250.01). 1 each 12  . cyclobenzaprine (FLEXERIL) 10 MG tablet Take 1 tablet (10 mg total) by mouth at bedtime. 30 tablet 2  . dexamethasone (DECADRON) 2 MG tablet Take 1 tablet (2 mg total) by mouth 2 (two) times daily. 20 tablet 0  . fluconazole (DIFLUCAN) 100 MG tablet Take 1 tablet (100 mg total) by mouth daily. 5 tablet 0  . Insulin Glargine (LANTUS) 100 UNIT/ML Solostar Pen Inject 36 Units into the skin daily at 10 pm. 4 pen 3  . lisinopril (PRINIVIL,ZESTRIL) 20 MG tablet Take 1 tablet (20 mg total) by mouth daily. 30 tablet 5  . LORazepam (ATIVAN) 0.5 MG tablet Take 1 tablet (0.5 mg total) by mouth every 6 (six) hours as needed (Nausea or vomiting). 30 tablet 0  . meloxicam (MOBIC) 15 MG tablet Take 1 tablet (15 mg total) by mouth daily. 30 tablet 0  . menthol-cetylpyridinium (CEPACOL) 3 MG lozenge Take 1 lozenge (3 mg total) by mouth as  needed for sore throat. 100 tablet 12  . metFORMIN (GLUCOPHAGE) 1000 MG tablet Take 1 tablet (1,000 mg total) by mouth 2 (two) times daily with a meal. 180 tablet 3  . ondansetron (ZOFRAN) 8 MG tablet Take 1 tablet (8 mg total) by mouth 2 (two) times daily as needed. Start on the third day after chemotherapy. 30 tablet 1  . oxyCODONE-acetaminophen (PERCOCET/ROXICET) 5-325 MG tablet Take 1 tablet by mouth every 6 (six) hours as needed for severe pain. 40 tablet 0  . prochlorperazine (COMPAZINE) 10 MG tablet Take 1 tablet (10 mg total) by mouth every 6 (six) hours as needed (Nausea or vomiting). 30 tablet 1  . sildenafil (REVATIO) 20 MG tablet 1-3 Tablet daily as needed ORAL 10 tablet 0  . sildenafil (VIAGRA) 50 MG tablet Take 50 mg by mouth daily as needed for erectile dysfunction.    . sucralfate (CARAFATE) 1 g tablet  Take 1 tablet (1 g total) by mouth 3 (three) times daily. Dissolve in 2 to 3 tbs warm water, swish &swallow 90 tablet 3  . UNIFINE PENTIPS 31G X 6 MM MISC 4 (four) times daily.     No current facility-administered medications for this visit.     Review of Systems:  GENERAL:  Feels "ok".  No fevers or sweats.  Weight down 12 pounds. PERFORMANCE STATUS (ECOG):  1 HEENT:  Vision little blurry secondary to diabetes.  Intermittent soft tinnitus.  No runny nose, sore throat, mouth sores or tenderness. Lungs: No shortness of breath or cough.  No hemoptysis. Cardiac:  No chest pain, palpitations, orthopnea, or PND.  GI:  Poor appetite.  Trouble swallowing (see HPI).  No nausea, vomiting, diarrhea, constipation, melena or hematochezia.  No prior colonoscopy. GU:  No urgency, frequency, dysuria, or hematuria.  Musculoskeletal:  No back pain.  Knee aches.  No muscle tenderness. Extremities:  No pain or swelling. Skin:  No rashes or skin changes. Neuro:  No headache, numbness or weakness, balance or coordination issues. Endocrine:  Diabetes (blood sugarimproved).  No thyroid issues, hot flashes or night sweats. Psych:  No mood changes, depression or anxiety. Pain:  Throat discomfort. Review of systems:  All other systems reviewed and found to be negative.  Physical Exam: Blood pressure (!) 135/92, pulse 80, temperature (!) 96 F (35.6 C), temperature source Tympanic, resp. rate 18, weight 193 lb 5.5 oz (87.7 kg). GENERAL:  Well developed, well nourished, gentleman sitting comfortably in the exam room in no acute distress. MENTAL STATUS:  Alert and oriented to person, place and time. HEAD:  Short gray hair.  Goatee.  Normocephalic, atraumatic, face symmetric, no Cushingoid features. EYES:  Blue eyes.  Pupils equal round and reactive to light and accomodation.  No conjunctivitis or scleral icterus. ENT:  Posterior pharynx mucositis.  Tongue normal.  Mucous membranes moist.  RESPIRATORY:  Clear to  auscultation without rales, wheezes or rhonchi. CARDIOVASCULAR:  Regular rate and rhythm without murmur, rub or gallop. ABDOMEN:  Soft, non-tender, with active bowel sounds, and no hepatosplenomegaly.  No masses. SKIN:  No rashes, ulcers or lesions. EXTREMITIES: No edema, no skin discoloration or tenderness.  No palpable cords. LYMPH NODES: Conglomerate left neck adenopathy 3.5 x 4 cm (smaller).  No palpable supraclavicular, axillary or inguinal adenopathy  NEUROLOGICAL: Unremarkable. PSYCH:  Appropriate.   Orders Only on 11/20/2015  Component Date Value Ref Range Status  . Sodium 11/20/2015 127* 135 - 145 mmol/L Final  . Potassium 11/20/2015 3.9  3.5 - 5.1 mmol/L Final  .  Chloride 11/20/2015 92* 101 - 111 mmol/L Final  . CO2 11/20/2015 26  22 - 32 mmol/L Final  . Glucose, Bld 11/20/2015 142* 65 - 99 mg/dL Final  . BUN 11/20/2015 28* 6 - 20 mg/dL Final  . Creatinine, Ser 11/20/2015 1.22  0.61 - 1.24 mg/dL Final  . Calcium 11/20/2015 8.6* 8.9 - 10.3 mg/dL Final  . GFR calc non Af Amer 11/20/2015 >60  >60 mL/min Final  . GFR calc Af Amer 11/20/2015 >60  >60 mL/min Final   Comment: (NOTE) The eGFR has been calculated using the CKD EPI equation. This calculation has not been validated in all clinical situations. eGFR's persistently <60 mL/min signify possible Chronic Kidney Disease.   . Anion gap 11/20/2015 9  5 - 15 Final  . Magnesium 11/20/2015 1.4* 1.7 - 2.4 mg/dL Final    Assessment:  Kent [name] is a 60 y.o. 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  Tumor was p16 IHC positive (high risk HPV).  CT soft tissue neck on 08/17/2015 revealed a slowly progressive over years, infiltrative, deep 4.1 x 5.4 cm LEFT parotid mass with widespread ipsilateral bulky adenopathy.  PET scan at [facility] on 09/17/2015 revealed left base of tongue malignancy corresponding to asymmetric enhancement identified on the neck CT with hypermetabolic  extensive conglomerate left cervical adenopathy (level II-IV) extending from the level of the angle of the mandible down inferiorly to the level of the cricoid cartilage.  There was clustered subcentimeter left level IB submandibular nodes with mild FDG activity.  There was no evidence of metastatic disease.  Alpha gal IgE was 1.21 (< 0.35) c/w IgE antibodies to galactose-alpha-1,3 galactose (oligosaccharide part on Fab portion of the cetuximab heavy chain) and suggestive of potential cetuximab reaction.  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  Pathologic stage was T4NxM0.  He took imatinib for 1 year.  He is currently day 9 of cycle #2 cisplatin (10/22/2015 - 11/12/2015) with concurrent radiation.  Left neck adenopathy has dramatically improved.  He has recurrent hyponatremia.  Sodium was 122 on 11/15/2015 prompting overnight admission for IVF (NS).  Symptomatically, he notes throat tenderness and difficulty swallowing.  Exam reveals decreasing left neck adenopathy.  Weight is down.  He has hyponatremia (127), hypomagnesemia (1.4), and hypocalcemia (8.6).  Plan: 1.  Labs today:  CBC with diff, BMP, Mg. 2.  Discuss importance of fluids and caloric intake.  Discuss electrolytes.  Discuss Boost, Ensure, Glucerna as well as soft foods.  Discuss IVF fluids daily in clinic.  Discuss possible IVF over weekend with home health care. 3.  IVF (1 liter NS) today and Mg 2 gm IV today 4.  IVF on Wed, Thursday and Friday this week. 5.  Daily BMP.  Check Mg on 11/22/2015. 6.  Discuss oral calcium supplementation. 7.  Assess feasibility of home health care for weekend. 8.  RTC on 11/23/2015 for MD assessment, labs (BMP, Mg), and IVF.   Lequita Asal, MD  11/20/2015, 10:17 AM

## 2015-11-20 NOTE — Progress Notes (Signed)
Patient unable to swallow due to radiation.  Using carafate.  Unable to tolerate solids at all.  Educated patient on trying room temp thin liquids, broth, soups, etc.  Patient requesting refill for compazine.

## 2015-11-21 ENCOUNTER — Inpatient Hospital Stay: Payer: Managed Care, Other (non HMO)

## 2015-11-21 ENCOUNTER — Ambulatory Visit: Payer: Managed Care, Other (non HMO)

## 2015-11-21 DIAGNOSIS — E876 Hypokalemia: Secondary | ICD-10-CM

## 2015-11-21 DIAGNOSIS — C01 Malignant neoplasm of base of tongue: Secondary | ICD-10-CM | POA: Diagnosis not present

## 2015-11-21 DIAGNOSIS — E871 Hypo-osmolality and hyponatremia: Secondary | ICD-10-CM

## 2015-11-21 LAB — BASIC METABOLIC PANEL
Anion gap: 9 (ref 5–15)
BUN: 23 mg/dL — ABNORMAL HIGH (ref 6–20)
CO2: 25 mmol/L (ref 22–32)
Calcium: 8.2 mg/dL — ABNORMAL LOW (ref 8.9–10.3)
Chloride: 96 mmol/L — ABNORMAL LOW (ref 101–111)
Creatinine, Ser: 0.9 mg/dL (ref 0.61–1.24)
GFR calc Af Amer: >60 mL/min (ref >60)
GFR calc non Af Amer: >60 mL/min (ref >60)
Glucose, Bld: 171 mg/dL — ABNORMAL HIGH (ref 65–99)
Potassium: 3.3 mmol/L — ABNORMAL LOW (ref 3.5–5.1)
Sodium: 130 mmol/L — ABNORMAL LOW (ref 135–145)

## 2015-11-21 MED ORDER — POTASSIUM CHLORIDE CRYS ER 10 MEQ PO TBCR
20.0000 meq | EXTENDED_RELEASE_TABLET | Freq: Every day | ORAL | Status: AC
  Administered 2015-11-21: 20 meq via ORAL
  Filled 2015-11-21: qty 2

## 2015-11-21 MED ORDER — SODIUM CHLORIDE 0.9 % IV SOLN
Freq: Once | INTRAVENOUS | Status: AC
  Administered 2015-11-21: 09:00:00 via INTRAVENOUS
  Filled 2015-11-21: qty 1000

## 2015-11-22 ENCOUNTER — Ambulatory Visit
Admission: RE | Admit: 2015-11-22 | Discharge: 2015-11-22 | Disposition: A | Discharge status: Home / Self Care | Payer: Managed Care, Other (non HMO) | Source: Ambulatory Visit | Attending: Radiation Oncology | Admitting: Radiation Oncology

## 2015-11-22 ENCOUNTER — Inpatient Hospital Stay: Payer: Managed Care, Other (non HMO)

## 2015-11-22 DIAGNOSIS — C01 Malignant neoplasm of base of tongue: Secondary | ICD-10-CM

## 2015-11-22 DIAGNOSIS — E871 Hypo-osmolality and hyponatremia: Secondary | ICD-10-CM

## 2015-11-22 LAB — BASIC METABOLIC PANEL
Anion gap: 8 (ref 5–15)
BUN: 18 mg/dL (ref 6–20)
CO2: 25 mmol/L (ref 22–32)
Calcium: 8.3 mg/dL — ABNORMAL LOW (ref 8.9–10.3)
Chloride: 97 mmol/L — ABNORMAL LOW (ref 101–111)
Creatinine, Ser: 0.93 mg/dL (ref 0.61–1.24)
GFR calc Af Amer: >60 mL/min (ref >60)
GFR calc non Af Amer: >60 mL/min (ref >60)
Glucose, Bld: 119 mg/dL — ABNORMAL HIGH (ref 65–99)
Potassium: 3.6 mmol/L (ref 3.5–5.1)
Sodium: 130 mmol/L — ABNORMAL LOW (ref 135–145)

## 2015-11-22 LAB — MAGNESIUM: Magnesium: 1.5 mg/dL — ABNORMAL LOW (ref 1.7–2.4)

## 2015-11-22 MED ORDER — ONDANSETRON HCL 40 MG/20ML IJ SOLN
Freq: Once | INTRAMUSCULAR | Status: AC
  Administered 2015-11-22: 11:00:00 via INTRAVENOUS
  Filled 2015-11-22 (×2): qty 4

## 2015-11-22 MED ORDER — SODIUM CHLORIDE 0.9 % IJ SOLN
10.0000 mL | INTRAMUSCULAR | Status: DC | PRN
  Administered 2015-11-22: 10 mL
  Filled 2015-11-22: qty 10

## 2015-11-22 MED ORDER — ALTEPLASE 2 MG IJ SOLR: 2.0000 mg | Freq: Once | INTRAMUSCULAR | Status: DC | PRN

## 2015-11-22 MED ORDER — MAGNESIUM SULFATE 2 GM/50ML IV SOLN
2.0000 g | Freq: Once | INTRAVENOUS | Status: AC
  Administered 2015-11-22: 2 g via INTRAVENOUS
  Filled 2015-11-22: qty 50

## 2015-11-22 MED ORDER — HEPARIN SOD (PORK) LOCK FLUSH 100 UNIT/ML IV SOLN
500.0000 [IU] | Freq: Once | INTRAVENOUS | Status: AC | PRN
  Administered 2015-11-22: 500 [IU]
  Filled 2015-11-22: qty 5

## 2015-11-22 MED ORDER — SODIUM CHLORIDE 0.9 % IV SOLN
Freq: Once | INTRAVENOUS | Status: AC
  Administered 2015-11-22: 10:00:00 via INTRAVENOUS
  Filled 2015-11-22: qty 1000

## 2015-11-22 MED ORDER — OXYCODONE-ACETAMINOPHEN 5-325 MG PO TABS: 1.0000 | ORAL_TABLET | Freq: Four times a day (QID) | ORAL | 0 refills | Status: DC | PRN

## 2015-11-22 MED ORDER — HEPARIN SOD (PORK) LOCK FLUSH 100 UNIT/ML IV SOLN
250.0000 [IU] | Freq: Once | INTRAVENOUS | Status: AC | PRN
  Administered 2015-11-22: 250 [IU]

## 2015-11-23 ENCOUNTER — Inpatient Hospital Stay (HOSPITAL_BASED_OUTPATIENT_CLINIC_OR_DEPARTMENT_OTHER): Payer: Managed Care, Other (non HMO) | Admitting: Hematology and Oncology

## 2015-11-23 ENCOUNTER — Ambulatory Visit
Admission: RE | Admit: 2015-11-23 | Discharge: 2015-11-23 | Disposition: A | Discharge status: Home / Self Care | Payer: Managed Care, Other (non HMO) | Source: Ambulatory Visit | Attending: Radiation Oncology | Admitting: Radiation Oncology

## 2015-11-23 ENCOUNTER — Inpatient Hospital Stay: Payer: Managed Care, Other (non HMO)

## 2015-11-23 ENCOUNTER — Encounter: Payer: Self-pay | Admitting: Hematology and Oncology

## 2015-11-23 ENCOUNTER — Other Ambulatory Visit: Payer: Self-pay | Admitting: *Deleted

## 2015-11-23 VITALS — BP 145/83 | HR 64 | Temp 98.5°F | Ht 67.0 in | Wt 196.5 lb

## 2015-11-23 DIAGNOSIS — Z794 Long term (current) use of insulin: Secondary | ICD-10-CM

## 2015-11-23 DIAGNOSIS — C01 Malignant neoplasm of base of tongue: Secondary | ICD-10-CM

## 2015-11-23 DIAGNOSIS — Z800 Family history of malignant neoplasm of digestive organs: Secondary | ICD-10-CM

## 2015-11-23 DIAGNOSIS — E871 Hypo-osmolality and hyponatremia: Secondary | ICD-10-CM

## 2015-11-23 DIAGNOSIS — E119 Type 2 diabetes mellitus without complications: Secondary | ICD-10-CM

## 2015-11-23 DIAGNOSIS — E1165 Type 2 diabetes mellitus with hyperglycemia: Secondary | ICD-10-CM

## 2015-11-23 DIAGNOSIS — J029 Acute pharyngitis, unspecified: Secondary | ICD-10-CM

## 2015-11-23 DIAGNOSIS — I10 Essential (primary) hypertension: Secondary | ICD-10-CM

## 2015-11-23 DIAGNOSIS — D696 Thrombocytopenia, unspecified: Secondary | ICD-10-CM

## 2015-11-23 DIAGNOSIS — E669 Obesity, unspecified: Secondary | ICD-10-CM

## 2015-11-23 DIAGNOSIS — E785 Hyperlipidemia, unspecified: Secondary | ICD-10-CM

## 2015-11-23 DIAGNOSIS — Z808 Family history of malignant neoplasm of other organs or systems: Secondary | ICD-10-CM

## 2015-11-23 DIAGNOSIS — M129 Arthropathy, unspecified: Secondary | ICD-10-CM

## 2015-11-23 DIAGNOSIS — Z7984 Long term (current) use of oral hypoglycemic drugs: Secondary | ICD-10-CM

## 2015-11-23 DIAGNOSIS — Z85028 Personal history of other malignant neoplasm of stomach: Secondary | ICD-10-CM

## 2015-11-23 DIAGNOSIS — R1310 Dysphagia, unspecified: Secondary | ICD-10-CM

## 2015-11-23 DIAGNOSIS — Z79899 Other long term (current) drug therapy: Secondary | ICD-10-CM

## 2015-11-23 LAB — CBC WITH DIFFERENTIAL/PLATELET
Basophils Absolute: 0 10*3/uL (ref 0–0.1)
Basophils Relative: 0 %
Eosinophils Absolute: 0 10*3/uL (ref 0–0.7)
Eosinophils Relative: 0 %
HCT: 32.1 % — ABNORMAL LOW (ref 40.0–52.0)
Hemoglobin: 11.5 g/dL — ABNORMAL LOW (ref 13.0–18.0)
Lymphocytes Relative: 7 %
Lymphs Abs: 0.4 10*3/uL — ABNORMAL LOW (ref 1.0–3.6)
MCH: 29.9 pg (ref 26.0–34.0)
MCHC: 35.8 g/dL (ref 32.0–36.0)
MCV: 83.6 fL (ref 80.0–100.0)
Monocytes Absolute: 0.4 10*3/uL (ref 0.2–1.0)
Monocytes Relative: 6 %
Neutro Abs: 5.6 10*3/uL (ref 1.4–6.5)
Neutrophils Relative %: 87 %
Platelets: 65 10*3/uL — ABNORMAL LOW (ref 150–440)
RBC: 3.84 MIL/uL — ABNORMAL LOW (ref 4.40–5.90)
RDW: 14.3 % (ref 11.5–14.5)
WBC: 6.4 10*3/uL (ref 3.8–10.6)

## 2015-11-23 LAB — MAGNESIUM: Magnesium: 1.5 mg/dL — ABNORMAL LOW (ref 1.7–2.4)

## 2015-11-23 LAB — BASIC METABOLIC PANEL
Anion gap: 8 (ref 5–15)
BUN: 15 mg/dL (ref 6–20)
CO2: 27 mmol/L (ref 22–32)
Calcium: 8.4 mg/dL — ABNORMAL LOW (ref 8.9–10.3)
Chloride: 99 mmol/L — ABNORMAL LOW (ref 101–111)
Creatinine, Ser: 0.93 mg/dL (ref 0.61–1.24)
GFR calc Af Amer: >60 mL/min (ref >60)
GFR calc non Af Amer: >60 mL/min (ref >60)
Glucose, Bld: 90 mg/dL (ref 65–99)
Potassium: 3.7 mmol/L (ref 3.5–5.1)
Sodium: 134 mmol/L — ABNORMAL LOW (ref 135–145)

## 2015-11-23 MED ORDER — OXYCODONE-ACETAMINOPHEN 5-325 MG PO TABS: 1.0000 | ORAL_TABLET | Freq: Four times a day (QID) | ORAL | 0 refills | Status: DC | PRN

## 2015-11-23 MED ORDER — HEPARIN SOD (PORK) LOCK FLUSH 100 UNIT/ML IV SOLN: 500.0000 [IU] | Freq: Once | INTRAVENOUS | Status: DC | PRN

## 2015-11-23 MED ORDER — SODIUM CHLORIDE 0.9 % IJ SOLN
10.0000 mL | INTRAMUSCULAR | Status: DC | PRN
  Filled 2015-11-23: qty 10

## 2015-11-23 MED ORDER — SODIUM CHLORIDE 0.9 % IV SOLN
Freq: Once | INTRAVENOUS | Status: AC
  Administered 2015-11-23: 11:00:00 via INTRAVENOUS
  Filled 2015-11-23: qty 1000

## 2015-11-23 MED ORDER — MAGNESIUM SULFATE 2 GM/50ML IV SOLN
2.0000 g | Freq: Once | INTRAVENOUS | Status: AC
  Administered 2015-11-23: 2 g via INTRAVENOUS
  Filled 2015-11-23: qty 50

## 2015-11-23 NOTE — Progress Notes (Signed)
Patient here for follow up. Complaining of sore throat, lack of appetite. Needs pain medication refill.

## 2015-11-23 NOTE — Progress Notes (Signed)
[place] [facility] day:  11/23/15  Chief Complaint: Kent [name] is a 60 y.o. 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  HPI:  The patient was last seen in the medical oncology clinic on 11/20/2015.  At that time, sodium had dropped to 127.  Magnesium and calcium were low.  He was drinking and eating very little.  He has early posterior pharynx mucositis secondary to radiation.  He had lost weight.    He has received IVF dailly (1 liter normal saline).  He states that he was feeling bad the other day.  Today he feels a little better. He ondansetron with his fluids yesterday secondary to nausea.   He states that he is drinking coffee, tea, water and 1 Ensure a day. His wife bought him [employer] breakfast.  Appetite remains poor. He states that he has a "terrible sore throat which is burning". He has a slimy feeling in his mouth which prevents him from wanting to eat.   Past Medical History:  Diagnosis Date  . Arthritis   . Diabetes mellitus without complication ([place])   . Hyperlipidemia   . Hypertension   . Obesity   . Stomach cancer ([place])    had tumor removed  . Tongue cancer [facility])     Past Surgical History:  Procedure Laterality Date  . gastric tumor removed  2010    Family History  Problem Relation Age of Onset  . Heart disease Mother   . Hypertension Father   . Cancer Sister     stomach  . Heart disease Brother     MI  . Cancer Daughter   . Cancer Son     testicular  . ADD / ADHD Sister     Social History:  reports that he has never smoked. He has never used smokeless tobacco. He reports that he does not drink alcohol or use drugs.  He smokes a pipe and cigar rarely (once every 3 months).  He has a son and daughter.  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  He has planned (and  paid for) a trip to the beach from 10/15/2015 - 10/20/2015.  He was no short term disability.  He has 10 weeks of vacation and sick leave.  The patient is accompanied by his wife today.  Allergies: No Known Allergies  Current Medications: Current Outpatient Prescriptions  Medication Sig Dispense Refill  . atorvastatin (LIPITOR) 20 MG tablet Take 1 tablet (20 mg total) by mouth daily. Reported on 04/06/2015 90 tablet 1  . blood glucose meter kit and supplies KIT Dispense based on patient and insurance preference. Use up to four times daily as directed. (FOR ICD-9 250.00, 250.01). 1 each 12  . cyclobenzaprine (FLEXERIL) 10 MG tablet Take 1 tablet (10 mg total) by mouth at bedtime. 30 tablet 2  . dexamethasone (DECADRON) 2 MG tablet Take 1 tablet (2 mg total) by mouth 2 (two) times daily. 20 tablet 0  . fluconazole (DIFLUCAN) 100 MG tablet Take 1 tablet (100 mg total) by mouth daily. 5 tablet 0  . Insulin Glargine (LANTUS) 100 UNIT/ML Solostar Pen Inject 36 Units into the skin daily at 10 pm. 4 pen 3  . lisinopril (PRINIVIL,ZESTRIL) 20 MG tablet Take 1 tablet (20 mg total) by mouth daily. 30 tablet 5  .  LORazepam (ATIVAN) 0.5 MG tablet Take 1 tablet (0.5 mg total) by mouth every 6 (six) hours as needed (Nausea or vomiting). 30 tablet 0  . meloxicam (MOBIC) 15 MG tablet Take 1 tablet (15 mg total) by mouth daily. 30 tablet 0  . menthol-cetylpyridinium (CEPACOL) 3 MG lozenge Take 1 lozenge (3 mg total) by mouth as needed for sore throat. 100 tablet 12  . metFORMIN (GLUCOPHAGE) 1000 MG tablet Take 1 tablet (1,000 mg total) by mouth 2 (two) times daily with a meal. 180 tablet 3  . ondansetron (ZOFRAN) 8 MG tablet Take 1 tablet (8 mg total) by mouth 2 (two) times daily as needed. Start on the third day after chemotherapy. 30 tablet 1  . oxyCODONE-acetaminophen (PERCOCET/ROXICET) 5-325 MG tablet Take 1 tablet by mouth every 6 (six) hours as needed for severe pain. 40 tablet 0  . prochlorperazine (COMPAZINE)  10 MG tablet Take 1 tablet (10 mg total) by mouth every 6 (six) hours as needed (Nausea or vomiting). 30 tablet 1  . sildenafil (REVATIO) 20 MG tablet 1-3 Tablet daily as needed ORAL 10 tablet 0  . sildenafil (VIAGRA) 50 MG tablet Take 50 mg by mouth daily as needed for erectile dysfunction.    . sucralfate (CARAFATE) 1 g tablet Take 1 tablet (1 g total) by mouth 3 (three) times daily. Dissolve in 2 to 3 tbs warm water, swish &swallow 90 tablet 3  . UNIFINE PENTIPS 31G X 6 MM MISC 4 (four) times daily.     No current facility-administered medications for this visit.     Review of Systems:  GENERAL:  Feels a little better today.  No fevers or sweats.  Weight up 3 pounds. PERFORMANCE STATUS (ECOG):  1 HEENT:  Vision little blurry secondary to diabetes.  Intermittent soft tinnitus.  Sore throat.  No runny nose or mouth sores. Lungs: No shortness of breath or cough.  No hemoptysis. Cardiac:  No chest pain, palpitations, orthopnea, or PND.  Took BP pill 1 hour ago. GI:  Appetite poor secondary to slimy feeling in throat.  No nausea, vomiting, diarrhea, constipation, melena or hematochezia.  No prior colonoscopy. GU:  No urgency, frequency, dysuria, or hematuria.  Musculoskeletal:  No back pain.  Knee aches.  No muscle tenderness. Extremities:  No pain or swelling. Skin:  No rashes or skin changes. Neuro:  No headache, numbness or weakness, balance or coordination issues. Endocrine:  Diabetes.  No thyroid issues, hot flashes or night sweats. Psych:  No mood changes, depression or anxiety. Pain:  Pain associated with adenopathy. Review of systems:  All other systems reviewed and found to be negative.  Physical Exam: Blood pressure (!) 145/83, pulse 64, temperature 98.5 F (36.9 C), temperature source Oral, height 5' 7"  (1.702 m), weight 196 lb 8.6 oz (89.1 kg), SpO2 98 %. GENERAL:  Fatigued appearing gentleman sitting comfortably in the exam room in no acute distress. MENTAL STATUS:  Alert and  oriented to person, place and time. HEAD:  Short gray hair.  Goatee.  Normocephalic, atraumatic, face symmetric, no Cushingoid features. EYES:  Blue eyes.  Pupils equal round and reactive to light and accomodation.  No conjunctivitis or scleral icterus. ENT:  Oropharynx clear with posterior pharynx pseudomembranes.  Tongue normal.  Mucous membranes moist.  RESPIRATORY:  Clear to auscultation without rales, wheezes or rhonchi. CARDIOVASCULAR:  Regular rate and rhythm without murmur, rub or gallop. ABDOMEN:  Soft, non-tender, with active bowel sounds, and no hepatosplenomegaly.  No masses. SKIN:  No  rashes, ulcers or lesions. EXTREMITIES: No edema, no skin discoloration or tenderness.  No palpable cords. LYMPH NODES: Conglomerate left neck adenopathy soft (improved).  No palpable supraclavicular, axillary or inguinal adenopathy  NEUROLOGICAL: Unremarkable. PSYCH:  Appropriate.   Appointment on 11/23/2015  Component Date Value Ref Range Status  . WBC 11/23/2015 6.4  3.8 - 10.6 K/uL Final  . RBC 11/23/2015 3.84* 4.40 - 5.90 MIL/uL Final  . Hemoglobin 11/23/2015 11.5* 13.0 - 18.0 g/dL Final  . HCT 11/23/2015 32.1* 40.0 - 52.0 % Final  . MCV 11/23/2015 83.6  80.0 - 100.0 fL Final  . MCH 11/23/2015 29.9  26.0 - 34.0 pg Final  . MCHC 11/23/2015 35.8  32.0 - 36.0 g/dL Final  . RDW 11/23/2015 14.3  11.5 - 14.5 % Final  . Platelets 11/23/2015 65* 150 - 440 K/uL Final  . Neutrophils Relative % 11/23/2015 87  % Final  . Neutro Abs 11/23/2015 5.6  1.4 - 6.5 K/uL Final  . Lymphocytes Relative 11/23/2015 7  % Final  . Lymphs Abs 11/23/2015 0.4* 1.0 - 3.6 K/uL Final  . Monocytes Relative 11/23/2015 6  % Final  . Monocytes Absolute 11/23/2015 0.4  0.2 - 1.0 K/uL Final  . Eosinophils Relative 11/23/2015 0  % Final  . Eosinophils Absolute 11/23/2015 0.0  0 - 0.7 K/uL Final  . Basophils Relative 11/23/2015 0  % Final  . Basophils Absolute 11/23/2015 0.0  0 - 0.1 K/uL Final  . Sodium 11/23/2015 134* 135  - 145 mmol/L Final  . Potassium 11/23/2015 3.7  3.5 - 5.1 mmol/L Final  . Chloride 11/23/2015 99* 101 - 111 mmol/L Final  . CO2 11/23/2015 27  22 - 32 mmol/L Final  . Glucose, Bld 11/23/2015 90  65 - 99 mg/dL Final  . BUN 11/23/2015 15  6 - 20 mg/dL Final  . Creatinine, Ser 11/23/2015 0.93  0.61 - 1.24 mg/dL Final  . Calcium 11/23/2015 8.4* 8.9 - 10.3 mg/dL Final  . GFR calc non Af Amer 11/23/2015 >60  >60 mL/min Final  . GFR calc Af Amer 11/23/2015 >60  >60 mL/min Final   Comment: (NOTE) The eGFR has been calculated using the CKD EPI equation. This calculation has not been validated in all clinical situations. eGFR's persistently <60 mL/min signify possible Chronic Kidney Disease.   . Anion gap 11/23/2015 8  5 - 15 Final  . Magnesium 11/23/2015 1.5* 1.7 - 2.4 mg/dL Final  Appointment on 11/22/2015  Component Date Value Ref Range Status  . Sodium 11/22/2015 130* 135 - 145 mmol/L Final  . Potassium 11/22/2015 3.6  3.5 - 5.1 mmol/L Final  . Chloride 11/22/2015 97* 101 - 111 mmol/L Final  . CO2 11/22/2015 25  22 - 32 mmol/L Final  . Glucose, Bld 11/22/2015 119* 65 - 99 mg/dL Final  . BUN 11/22/2015 18  6 - 20 mg/dL Final  . Creatinine, Ser 11/22/2015 0.93  0.61 - 1.24 mg/dL Final  . Calcium 11/22/2015 8.3* 8.9 - 10.3 mg/dL Final  . GFR calc non Af Amer 11/22/2015 >60  >60 mL/min Final  . GFR calc Af Amer 11/22/2015 >60  >60 mL/min Final   Comment: (NOTE) The eGFR has been calculated using the CKD EPI equation. This calculation has not been validated in all clinical situations. eGFR's persistently <60 mL/min signify possible Chronic Kidney Disease.   . Anion gap 11/22/2015 8  5 - 15 Final  . Magnesium 11/22/2015 1.5* 1.7 - 2.4 mg/dL Final  Appointment on 11/21/2015  Component Date Value Ref Range Status  . Sodium 11/21/2015 130* 135 - 145 mmol/L Final  . Potassium 11/21/2015 3.3* 3.5 - 5.1 mmol/L Final  . Chloride 11/21/2015 96* 101 - 111 mmol/L Final  . CO2 11/21/2015 25  22 -  32 mmol/L Final  . Glucose, Bld 11/21/2015 171* 65 - 99 mg/dL Final  . BUN 11/21/2015 23* 6 - 20 mg/dL Final  . Creatinine, Ser 11/21/2015 0.90  0.61 - 1.24 mg/dL Final  . Calcium 11/21/2015 8.2* 8.9 - 10.3 mg/dL Final  . GFR calc non Af Amer 11/21/2015 >60  >60 mL/min Final  . GFR calc Af Amer 11/21/2015 >60  >60 mL/min Final   Comment: (NOTE) The eGFR has been calculated using the CKD EPI equation. This calculation has not been validated in all clinical situations. eGFR's persistently <60 mL/min signify possible Chronic Kidney Disease.   . Anion gap 11/21/2015 9  5 - 15 Final    Assessment:  Kent [name] is a 60 y.o. 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  Tumor was p16 IHC positive (high risk HPV).  CT soft tissue neck on 08/17/2015 revealed a slowly progressive over years, infiltrative, deep 4.1 x 5.4 cm LEFT parotid mass with widespread ipsilateral bulky adenopathy.  PET scan at [facility] on 09/17/2015 revealed left base of tongue malignancy corresponding to asymmetric enhancement identified on the neck CT with hypermetabolic extensive conglomerate left cervical adenopathy (level II-IV) extending from the level of the angle of the mandible down inferiorly to the level of the cricoid cartilage.  There was clustered subcentimeter left level IB submandibular nodes with mild FDG activity.  There was no evidence of metastatic disease.  Alpha gal IgE was 1.21 (< 0.35) c/w IgE antibodies to galactose-alpha-1,3 galactose (oligosaccharide part on Fab portion of the cetuximab heavy chain) and suggestive of potential cetuximab reaction.  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  Pathologic stage was T4NxM0.  He took imatinib for 1 year.  He is currently day 12 of cycle #2 cisplatin (10/22/2015 - 11/12/2015) with  concurrent radiation.  Left neck adenopathy has dramatically improved.  He has recurrent hyponatremia.  Sodium was 122 on 11/15/2015 prompting overnight admission for IVF (NS).  He has received daily IVF this week.  Sodium is 134.    Symptomatically, has a sore throat and poor oral intake.  Exam reveals decreasing left neck adenopathy.  Weight is up with daily fluids.  He has mild thrombocytopenia.  Magnesium is low.  Plan: 1.  Labs today:  CBC with diff, BMP, Mg. 2.  1 liter NS + 2 gm Mg sulfate today. 3.  Discuss inability to have home health care for short period.  Discuss phone follow-up over weekend if doing poorly. 4.  Discuss strategies for increased oral intake. 5.  RTC on 11/26/2015 for MD assessment and labs (CBC with diff, BMP, Mg) + IVF.   Lequita Asal, MD  11/23/2015, 10:12 AM

## 2015-11-25 ENCOUNTER — Encounter: Payer: Self-pay | Admitting: Hematology and Oncology

## 2015-11-25 ENCOUNTER — Other Ambulatory Visit: Payer: Self-pay | Admitting: Hematology and Oncology

## 2015-11-25 NOTE — Progress Notes (Signed)
[facility] day:  11/26/15  Chief Complaint: Kent [name] is a 60 y.o. 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  HPI:  The patient was last seen in the medical oncology clinic on 11/23/2015.  At that time, he had a sore throat and poor oral intake.  Exam revealed decreasing left neck adenopathy.  Weight was up with daily fluids.  He had mild thrombocytopenia (65,000).  Magnesium was low (1.5).  He received 1 liter NS plus 2 gm of IV magnesium.  He was counseled regarding fluid and caloric intake.  The patient notes ongoing throat discomfort. He has decreased caloric and fluid intake. His wife is trying to encourage him to try different things. Yesterday, he had a half a chicken salad sandwich.  He had a half a pimento cheese sandwich this morning. He had 2 [employer] instant breakfast drinks on 11/24/2015 and 1 on 11/25/2015.   Past Medical History:  Diagnosis Date  . Arthritis   . Diabetes mellitus without complication ([place])   . Hyperlipidemia   . Hypertension   . Obesity   . Stomach cancer ([place])    had tumor removed  . Tongue cancer [facility])     Past Surgical History:  Procedure Laterality Date  . gastric tumor removed  2010    Family History  Problem Relation Age of Onset  . Heart disease Mother   . Hypertension Father   . Cancer Sister     stomach  . Heart disease Brother     MI  . Cancer Daughter   . Cancer Son     testicular  . ADD / ADHD Sister     Social History:  reports that he has never smoked. He has never used smokeless tobacco. He reports that he does not drink alcohol or use drugs.  He smokes a pipe and cigar rarely (once every 3 months).  He has a son and daughter.  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  He has planned (and paid for) a trip to the beach from 10/15/2015  - 10/20/2015.  He was no short term disability.  He has 10 weeks of vacation and sick leave.  The patient is accompanied by his wife today.  Allergies: No Known Allergies  Current Medications: Current Outpatient Prescriptions  Medication Sig Dispense Refill  . atorvastatin (LIPITOR) 20 MG tablet Take 1 tablet (20 mg total) by mouth daily. Reported on 04/06/2015 90 tablet 1  . blood glucose meter kit and supplies KIT Dispense based on patient and insurance preference. Use up to four times daily as directed. (FOR ICD-9 250.00, 250.01). 1 each 12  . cyclobenzaprine (FLEXERIL) 10 MG tablet Take 1 tablet (10 mg total) by mouth at bedtime. 30 tablet 2  . dexamethasone (DECADRON) 2 MG tablet Take 1 tablet (2 mg total) by mouth 2 (two) times daily. 20 tablet 0  . fluconazole (DIFLUCAN) 100 MG tablet Take 1 tablet (100 mg total) by mouth daily. 5 tablet 0  . Insulin Glargine (LANTUS) 100 UNIT/ML Solostar Pen Inject 36 Units into the skin daily at 10 pm. 4 pen 3  . lisinopril (PRINIVIL,ZESTRIL) 20 MG tablet Take 1 tablet (20 mg total) by mouth daily. 30 tablet 5  . LORazepam (ATIVAN) 0.5 MG tablet Take 1 tablet (0.5 mg total) by  mouth every 6 (six) hours as needed (Nausea or vomiting). 30 tablet 0  . meloxicam (MOBIC) 15 MG tablet Take 1 tablet (15 mg total) by mouth daily. 30 tablet 0  . menthol-cetylpyridinium (CEPACOL) 3 MG lozenge Take 1 lozenge (3 mg total) by mouth as needed for sore throat. 100 tablet 12  . metFORMIN (GLUCOPHAGE) 1000 MG tablet Take 1 tablet (1,000 mg total) by mouth 2 (two) times daily with a meal. 180 tablet 3  . ondansetron (ZOFRAN) 8 MG tablet Take 1 tablet (8 mg total) by mouth 2 (two) times daily as needed. Start on the third day after chemotherapy. 30 tablet 1  . oxyCODONE-acetaminophen (PERCOCET/ROXICET) 5-325 MG tablet Take 1 tablet by mouth every 6 (six) hours as needed for severe pain. 40 tablet 0  . prochlorperazine (COMPAZINE) 10 MG tablet Take 1 tablet (10 mg total) by  mouth every 6 (six) hours as needed (Nausea or vomiting). 30 tablet 1  . sildenafil (REVATIO) 20 MG tablet 1-3 Tablet daily as needed ORAL 10 tablet 0  . sildenafil (VIAGRA) 50 MG tablet Take 50 mg by mouth daily as needed for erectile dysfunction.    . sucralfate (CARAFATE) 1 g tablet Take 1 tablet (1 g total) by mouth 3 (three) times daily. Dissolve in 2 to 3 tbs warm water, swish &swallow 90 tablet 3  . UNIFINE PENTIPS 31G X 6 MM MISC 4 (four) times daily.     No current facility-administered medications for this visit.     Review of Systems:  GENERAL:  Feels"ok".  No fevers or sweats.  Weight stable. PERFORMANCE STATUS (ECOG):  1 HEENT:  Vision little blurry secondary to diabetes.  Intermittent soft tinnitus.  Sore throat (stable).  No runny nose or mouth sores. Lungs: No shortness of breath or cough.  No hemoptysis. Cardiac:  No chest pain, palpitations, orthopnea, or PND.  Took BP pill 1 hour ago. GI:  Appetite poor.  No nausea, vomiting, diarrhea, constipation, melena or hematochezia.  No prior colonoscopy. GU:  No urgency, frequency, dysuria, or hematuria.  Musculoskeletal:  No back pain.  Knee aches.  No muscle tenderness. Extremities:  No pain or swelling. Skin:  No rashes or skin changes. Neuro:  No headache, numbness or weakness, balance or coordination issues. Endocrine:  Diabetes.  No thyroid issues, hot flashes or night sweats. Psych:  No mood changes, depression or anxiety. Pain:  Throat pain. Review of systems:  All other systems reviewed and found to be negative.  Physical Exam: Blood pressure 110/72, pulse 61, temperature (!) 96.9 F (36.1 C), temperature source Tympanic, resp. rate 18, weight 196 lb 1.6 oz (89 kg). GENERAL:  Fatigued appearing gentleman sitting comfortably in the exam room in no acute distress. MENTAL STATUS:  Alert and oriented to person, place and time. HEAD:  Short gray hair.  Goatee.  Normocephalic, atraumatic, face symmetric, no Cushingoid  features. EYES:  Blue eyes.  Pupils equal round and reactive to light and accomodation.  No conjunctivitis or scleral icterus. ENT:  Oropharynx with mild posterior pharynx mucositis.  Tongue normal.  Mucous membranes moist.  RESPIRATORY:  Clear to auscultation without rales, wheezes or rhonchi. CARDIOVASCULAR:  Regular rate and rhythm without murmur, rub or gallop. ABDOMEN:  Soft, non-tender, with active bowel sounds, and no hepatosplenomegaly.  No masses. SKIN:  No rashes, ulcers or lesions. EXTREMITIES: No edema, no skin discoloration or tenderness.  No palpable cords. LYMPH NODES: Conglomerate left neck adenopathy 3 x 4.5 cm (smaller).  No palpable  supraclavicular, axillary or inguinal adenopathy  NEUROLOGICAL: Unremarkable. PSYCH:  Appropriate.   Appointment on 11/26/2015  Component Date Value Ref Range Status  . WBC 11/26/2015 5.0  3.8 - 10.6 K/uL Final  . RBC 11/26/2015 3.79* 4.40 - 5.90 MIL/uL Final  . Hemoglobin 11/26/2015 11.5* 13.0 - 18.0 g/dL Final  . HCT 11/26/2015 32.0* 40.0 - 52.0 % Final  . MCV 11/26/2015 84.4  80.0 - 100.0 fL Final  . MCH 11/26/2015 30.4  26.0 - 34.0 pg Final  . MCHC 11/26/2015 36.0  32.0 - 36.0 g/dL Final  . RDW 11/26/2015 14.6* 11.5 - 14.5 % Final  . Platelets 11/26/2015 88* 150 - 440 K/uL Final  . Neutrophils Relative % 11/26/2015 88  % Final  . Neutro Abs 11/26/2015 4.4  1.4 - 6.5 K/uL Final  . Lymphocytes Relative 11/26/2015 7  % Final  . Lymphs Abs 11/26/2015 0.3* 1.0 - 3.6 K/uL Final  . Monocytes Relative 11/26/2015 5  % Final  . Monocytes Absolute 11/26/2015 0.2  0.2 - 1.0 K/uL Final  . Eosinophils Relative 11/26/2015 0  % Final  . Eosinophils Absolute 11/26/2015 0.0  0 - 0.7 K/uL Final  . Basophils Relative 11/26/2015 0  % Final  . Basophils Absolute 11/26/2015 0.0  0 - 0.1 K/uL Final  . Magnesium 11/26/2015 1.5* 1.7 - 2.4 mg/dL Final  . Sodium 11/26/2015 132* 135 - 145 mmol/L Final  . Potassium 11/26/2015 3.5  3.5 - 5.1 mmol/L Final  .  Chloride 11/26/2015 100* 101 - 111 mmol/L Final  . CO2 11/26/2015 26  22 - 32 mmol/L Final  . Glucose, Bld 11/26/2015 116* 65 - 99 mg/dL Final  . BUN 11/26/2015 13  6 - 20 mg/dL Final  . Creatinine, Ser 11/26/2015 0.90  0.61 - 1.24 mg/dL Final  . Calcium 11/26/2015 8.6* 8.9 - 10.3 mg/dL Final  . GFR calc non Af Amer 11/26/2015 >60  >60 mL/min Final  . GFR calc Af Amer 11/26/2015 >60  >60 mL/min Final   Comment: (NOTE) The eGFR has been calculated using the CKD EPI equation. This calculation has not been validated in all clinical situations. eGFR's persistently <60 mL/min signify possible Chronic Kidney Disease.   Georgiann Hahn gap 11/26/2015 6  5 - 15 Final    Assessment:  Kent [name] is a 60 y.o. 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  Tumor was p16 IHC positive (high risk HPV).  CT soft tissue neck on 08/17/2015 revealed a slowly progressive over years, infiltrative, deep 4.1 x 5.4 cm LEFT parotid mass with widespread ipsilateral bulky adenopathy.  PET scan at [facility] on 09/17/2015 revealed left base of tongue malignancy corresponding to asymmetric enhancement identified on the neck CT with hypermetabolic extensive conglomerate left cervical adenopathy (level II-IV) extending from the level of the angle of the mandible down inferiorly to the level of the cricoid cartilage.  There was clustered subcentimeter left level IB submandibular nodes with mild FDG activity.  There was no evidence of metastatic disease.  Alpha gal IgE was 1.21 (< 0.35) c/w IgE antibodies to galactose-alpha-1,3 galactose (oligosaccharide part on Fab portion of the cetuximab heavy chain) and suggestive of potential cetuximab reaction.  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  Pathologic stage was T4NxM0.  He took imatinib for 1 year.  He is  currently day 15 of cycle #2 cisplatin (10/22/2015 - 11/12/2015) with concurrent radiation.  Left neck adenopathy has dramatically improved.  He has recurrent hyponatremia.  Sodium was 122 on 11/15/2015 prompting overnight admission for IVF (NS).  He has received daily IVF last week.  Sodium is 132    Symptomatically, he has a persistent sore throat and poor oral intake secondary to ongoing radiation.  Exam reveals decreasing left neck adenopathy.  Weight is stable.  He has mild improving thrombocytopenia (88,000).  Magnesium is low (1.5).  Plan: 1.  Labs today:  CBC with diff, BMP, Mg. 2.  1 liter NS + 2 gm Mg sulfate today. 3.  RTC daily this week for 1 liter NS +/- IV magnesium. 4.  Discuss caloric and fluid intake.  Encourage calcium supplementation. 5.  Check BMP and Mg on 11/28/2015 and 11/30/2015. 6.  RTC on 12/03/2015 for MD assessment, labs (CBC with diff, CMP, Mg) + cycle #3 cisplatin.   Lequita Asal, MD  11/26/2015, 9:54 AM

## 2015-11-26 ENCOUNTER — Inpatient Hospital Stay: Payer: Managed Care, Other (non HMO)

## 2015-11-26 ENCOUNTER — Ambulatory Visit
Admission: RE | Admit: 2015-11-26 | Discharge: 2015-11-26 | Disposition: A | Discharge status: Home / Self Care | Payer: Managed Care, Other (non HMO) | Source: Ambulatory Visit | Attending: Radiation Oncology | Admitting: Radiation Oncology

## 2015-11-26 ENCOUNTER — Encounter: Payer: Self-pay | Admitting: Hematology and Oncology

## 2015-11-26 ENCOUNTER — Inpatient Hospital Stay (HOSPITAL_BASED_OUTPATIENT_CLINIC_OR_DEPARTMENT_OTHER): Payer: Managed Care, Other (non HMO) | Admitting: Hematology and Oncology

## 2015-11-26 VITALS — BP 110/72 | HR 61 | Temp 96.9°F | Resp 18 | Wt 196.1 lb

## 2015-11-26 DIAGNOSIS — J029 Acute pharyngitis, unspecified: Secondary | ICD-10-CM

## 2015-11-26 DIAGNOSIS — I10 Essential (primary) hypertension: Secondary | ICD-10-CM

## 2015-11-26 DIAGNOSIS — Z794 Long term (current) use of insulin: Secondary | ICD-10-CM

## 2015-11-26 DIAGNOSIS — D696 Thrombocytopenia, unspecified: Secondary | ICD-10-CM

## 2015-11-26 DIAGNOSIS — C01 Malignant neoplasm of base of tongue: Secondary | ICD-10-CM

## 2015-11-26 DIAGNOSIS — Z808 Family history of malignant neoplasm of other organs or systems: Secondary | ICD-10-CM

## 2015-11-26 DIAGNOSIS — Z79899 Other long term (current) drug therapy: Secondary | ICD-10-CM

## 2015-11-26 DIAGNOSIS — M129 Arthropathy, unspecified: Secondary | ICD-10-CM

## 2015-11-26 DIAGNOSIS — E871 Hypo-osmolality and hyponatremia: Secondary | ICD-10-CM

## 2015-11-26 DIAGNOSIS — Z800 Family history of malignant neoplasm of digestive organs: Secondary | ICD-10-CM

## 2015-11-26 DIAGNOSIS — E1165 Type 2 diabetes mellitus with hyperglycemia: Secondary | ICD-10-CM

## 2015-11-26 DIAGNOSIS — Z85028 Personal history of other malignant neoplasm of stomach: Secondary | ICD-10-CM

## 2015-11-26 DIAGNOSIS — E785 Hyperlipidemia, unspecified: Secondary | ICD-10-CM

## 2015-11-26 DIAGNOSIS — E669 Obesity, unspecified: Secondary | ICD-10-CM

## 2015-11-26 DIAGNOSIS — Z7984 Long term (current) use of oral hypoglycemic drugs: Secondary | ICD-10-CM

## 2015-11-26 DIAGNOSIS — E119 Type 2 diabetes mellitus without complications: Secondary | ICD-10-CM

## 2015-11-26 DIAGNOSIS — R1310 Dysphagia, unspecified: Secondary | ICD-10-CM

## 2015-11-26 LAB — BASIC METABOLIC PANEL
Anion gap: 6 (ref 5–15)
BUN: 13 mg/dL (ref 6–20)
CO2: 26 mmol/L (ref 22–32)
Calcium: 8.6 mg/dL — ABNORMAL LOW (ref 8.9–10.3)
Chloride: 100 mmol/L — ABNORMAL LOW (ref 101–111)
Creatinine, Ser: 0.9 mg/dL (ref 0.61–1.24)
GFR calc Af Amer: >60 mL/min (ref >60)
GFR calc non Af Amer: >60 mL/min (ref >60)
Glucose, Bld: 116 mg/dL — ABNORMAL HIGH (ref 65–99)
Potassium: 3.5 mmol/L (ref 3.5–5.1)
Sodium: 132 mmol/L — ABNORMAL LOW (ref 135–145)

## 2015-11-26 LAB — CBC WITH DIFFERENTIAL/PLATELET
Basophils Absolute: 0 10*3/uL (ref 0–0.1)
Basophils Relative: 0 %
Eosinophils Absolute: 0 10*3/uL (ref 0–0.7)
Eosinophils Relative: 0 %
HCT: 32 % — ABNORMAL LOW (ref 40.0–52.0)
Hemoglobin: 11.5 g/dL — ABNORMAL LOW (ref 13.0–18.0)
Lymphocytes Relative: 7 %
Lymphs Abs: 0.3 10*3/uL — ABNORMAL LOW (ref 1.0–3.6)
MCH: 30.4 pg (ref 26.0–34.0)
MCHC: 36 g/dL (ref 32.0–36.0)
MCV: 84.4 fL (ref 80.0–100.0)
Monocytes Absolute: 0.2 10*3/uL (ref 0.2–1.0)
Monocytes Relative: 5 %
Neutro Abs: 4.4 10*3/uL (ref 1.4–6.5)
Neutrophils Relative %: 88 %
Platelets: 88 10*3/uL — ABNORMAL LOW (ref 150–440)
RBC: 3.79 MIL/uL — ABNORMAL LOW (ref 4.40–5.90)
RDW: 14.6 % — ABNORMAL HIGH (ref 11.5–14.5)
WBC: 5 10*3/uL (ref 3.8–10.6)

## 2015-11-26 LAB — MAGNESIUM: Magnesium: 1.5 mg/dL — ABNORMAL LOW (ref 1.7–2.4)

## 2015-11-26 MED ORDER — SODIUM CHLORIDE 0.9 % IV SOLN
Freq: Once | INTRAVENOUS | Status: AC
  Administered 2015-11-26: 11:00:00 via INTRAVENOUS
  Filled 2015-11-26: qty 1000

## 2015-11-26 MED ORDER — MAGNESIUM SULFATE 2 GM/50ML IV SOLN
2.0000 g | Freq: Once | INTRAVENOUS | Status: AC
  Administered 2015-11-26: 2 g via INTRAVENOUS
  Filled 2015-11-26: qty 50

## 2015-11-26 NOTE — Progress Notes (Signed)
Patient is here today for follow up, says he feels swimmy headed.  Declines flu shot  Sitting: 121/81 hr 58 Standing:106/77 hr 66

## 2015-11-27 ENCOUNTER — Inpatient Hospital Stay: Payer: Managed Care, Other (non HMO)

## 2015-11-27 ENCOUNTER — Ambulatory Visit
Admission: RE | Admit: 2015-11-27 | Discharge: 2015-11-27 | Disposition: A | Discharge status: Home / Self Care | Payer: Managed Care, Other (non HMO) | Source: Ambulatory Visit | Attending: Radiation Oncology | Admitting: Radiation Oncology

## 2015-11-27 DIAGNOSIS — C01 Malignant neoplasm of base of tongue: Secondary | ICD-10-CM | POA: Diagnosis not present

## 2015-11-27 MED ORDER — SODIUM CHLORIDE 0.9 % IV SOLN
Freq: Once | INTRAVENOUS | Status: AC
  Administered 2015-11-27: 09:00:00 via INTRAVENOUS
  Filled 2015-11-27: qty 1000

## 2015-11-27 MED ORDER — SODIUM CHLORIDE 0.9 % IJ SOLN
10.0000 mL | Freq: Once | INTRAMUSCULAR | Status: AC
  Administered 2015-11-27: 10 mL via INTRAVENOUS
  Filled 2015-11-27: qty 10

## 2015-11-28 ENCOUNTER — Other Ambulatory Visit: Payer: Self-pay | Admitting: Hematology and Oncology

## 2015-11-28 ENCOUNTER — Inpatient Hospital Stay: Payer: Managed Care, Other (non HMO)

## 2015-11-28 ENCOUNTER — Ambulatory Visit
Admission: RE | Admit: 2015-11-28 | Discharge: 2015-11-28 | Disposition: A | Discharge status: Home / Self Care | Payer: Managed Care, Other (non HMO) | Source: Ambulatory Visit | Attending: Radiation Oncology | Admitting: Radiation Oncology

## 2015-11-28 ENCOUNTER — Other Ambulatory Visit: Payer: Self-pay | Admitting: *Deleted

## 2015-11-28 DIAGNOSIS — E860 Dehydration: Secondary | ICD-10-CM

## 2015-11-28 DIAGNOSIS — C01 Malignant neoplasm of base of tongue: Secondary | ICD-10-CM | POA: Diagnosis not present

## 2015-11-28 DIAGNOSIS — E871 Hypo-osmolality and hyponatremia: Secondary | ICD-10-CM

## 2015-11-28 LAB — MAGNESIUM: Magnesium: 1.6 mg/dL — ABNORMAL LOW (ref 1.7–2.4)

## 2015-11-28 LAB — BASIC METABOLIC PANEL
Anion gap: 6 (ref 5–15)
BUN: 11 mg/dL (ref 6–20)
CO2: 26 mmol/L (ref 22–32)
Calcium: 8.6 mg/dL — ABNORMAL LOW (ref 8.9–10.3)
Chloride: 102 mmol/L (ref 101–111)
Creatinine, Ser: 0.73 mg/dL (ref 0.61–1.24)
GFR calc Af Amer: >60 mL/min (ref >60)
GFR calc non Af Amer: >60 mL/min (ref >60)
Glucose, Bld: 96 mg/dL (ref 65–99)
Potassium: 3.5 mmol/L (ref 3.5–5.1)
Sodium: 134 mmol/L — ABNORMAL LOW (ref 135–145)

## 2015-11-28 MED ORDER — SODIUM CHLORIDE 0.9 % IV SOLN
INTRAVENOUS | Status: DC
  Administered 2015-11-28: 09:00:00 via INTRAVENOUS
  Filled 2015-11-28: qty 1000

## 2015-11-28 MED ORDER — SODIUM CHLORIDE 0.9 % IV SOLN
1.0000 g | Freq: Once | INTRAVENOUS | Status: AC
  Administered 2015-11-28: 1 g via INTRAVENOUS
  Filled 2015-11-28: qty 2

## 2015-11-28 MED ORDER — DEXAMETHASONE 2 MG PO TABS: 2.0000 mg | ORAL_TABLET | Freq: Two times a day (BID) | ORAL | 0 refills | Status: DC

## 2015-11-29 ENCOUNTER — Ambulatory Visit: Payer: Managed Care, Other (non HMO)

## 2015-11-29 ENCOUNTER — Inpatient Hospital Stay: Payer: Managed Care, Other (non HMO)

## 2015-11-30 ENCOUNTER — Other Ambulatory Visit: Payer: Self-pay | Admitting: *Deleted

## 2015-11-30 ENCOUNTER — Inpatient Hospital Stay: Payer: Managed Care, Other (non HMO)

## 2015-11-30 ENCOUNTER — Other Ambulatory Visit: Payer: Self-pay

## 2015-11-30 ENCOUNTER — Other Ambulatory Visit: Payer: Self-pay | Admitting: Hematology and Oncology

## 2015-11-30 ENCOUNTER — Ambulatory Visit: Payer: Managed Care, Other (non HMO)

## 2015-11-30 DIAGNOSIS — E871 Hypo-osmolality and hyponatremia: Secondary | ICD-10-CM

## 2015-11-30 DIAGNOSIS — C01 Malignant neoplasm of base of tongue: Secondary | ICD-10-CM | POA: Diagnosis not present

## 2015-11-30 LAB — MAGNESIUM: Magnesium: 1.6 mg/dL — ABNORMAL LOW (ref 1.7–2.4)

## 2015-11-30 LAB — BASIC METABOLIC PANEL
Anion gap: 8 (ref 5–15)
BUN: 12 mg/dL (ref 6–20)
CO2: 26 mmol/L (ref 22–32)
Calcium: 8.8 mg/dL — ABNORMAL LOW (ref 8.9–10.3)
Chloride: 100 mmol/L — ABNORMAL LOW (ref 101–111)
Creatinine, Ser: 0.89 mg/dL (ref 0.61–1.24)
GFR calc Af Amer: >60 mL/min (ref >60)
GFR calc non Af Amer: >60 mL/min (ref >60)
Glucose, Bld: 144 mg/dL — ABNORMAL HIGH (ref 65–99)
Potassium: 3.4 mmol/L — ABNORMAL LOW (ref 3.5–5.1)
Sodium: 134 mmol/L — ABNORMAL LOW (ref 135–145)

## 2015-11-30 MED ORDER — SODIUM CHLORIDE 0.9 % IV SOLN
1.0000 g | Freq: Once | INTRAVENOUS | Status: AC
  Administered 2015-11-30: 1 g via INTRAVENOUS
  Filled 2015-11-30: qty 2

## 2015-11-30 MED ORDER — POTASSIUM CHLORIDE ER 10 MEQ PO TBCR: 10.0000 meq | EXTENDED_RELEASE_TABLET | Freq: Two times a day (BID) | ORAL | 0 refills | Status: DC

## 2015-11-30 MED ORDER — SODIUM CHLORIDE 0.9 % IV SOLN
Freq: Once | INTRAVENOUS | Status: AC
  Administered 2015-11-30: 09:00:00 via INTRAVENOUS
  Filled 2015-11-30: qty 1000

## 2015-12-02 ENCOUNTER — Other Ambulatory Visit: Payer: Self-pay | Admitting: Hematology and Oncology

## 2015-12-02 DIAGNOSIS — C01 Malignant neoplasm of base of tongue: Secondary | ICD-10-CM

## 2015-12-02 NOTE — Progress Notes (Signed)
[facility] day:  12/03/15  Chief Complaint: Kent [name] is a 60 y.o. 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  HPI:  The patient was last seen in the medical oncology clinic on 11/26/2015.  At that time, he had a persistent sore throat and poor oral intake secondary to ongoing radiation.  Exam revealed decreasing left neck adenopathy.  Weight was stable.  He had mild improving thrombocytopenia (88,000).  Magnesium was low (1.5).  He received 1 liter NS and 2 gm magnesium sulfate.  He received fluids daily last week except for 11/28/2015 and 11/29/2015.  He received 1 gm of magnesium on 11/28/2015 and 11/30/2015.  He received fluids on 11/30/2015.  He was given a prescription for potassium chloride.  Symptomatically, he feels weak today.  He states that he is not eating.  He had some Ensure this morning.  Yesterday he had some instant breakfast.  He tried a pork chop, but it was too dry.  He has eaten "yogurt strips".  His throat is still painful.  He denies any fever.   Past Medical History:  Diagnosis Date  . Arthritis   . Diabetes mellitus without complication ([place])   . Hyperlipidemia   . Hypertension   . Obesity   . Stomach cancer ([place])    had tumor removed  . Tongue cancer [facility])     Past Surgical History:  Procedure Laterality Date  . gastric tumor removed  2010    Family History  Problem Relation Age of Onset  . Heart disease Mother   . Hypertension Father   . Cancer Sister     stomach  . Heart disease Brother     MI  . Cancer Daughter   . Cancer Son     testicular  . ADD / ADHD Sister     Social History:  reports that he has never smoked. He has never used smokeless tobacco. He reports that he does not drink alcohol or use drugs.  He smokes a pipe and cigar rarely (once every 3 months).  He has a  son and daughter.  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  He has planned (and paid for) a trip to the beach from 10/15/2015 - 10/20/2015.  He was no short term disability.  He has 10 weeks of vacation and sick leave.  The patient is alone today.  Allergies: No Known Allergies  Current Medications: Current Outpatient Prescriptions  Medication Sig Dispense Refill  . atorvastatin (LIPITOR) 20 MG tablet Take 1 tablet (20 mg total) by mouth daily. Reported on 04/06/2015 90 tablet 1  . blood glucose meter kit and supplies KIT Dispense based on patient and insurance preference. Use up to four times daily as directed. (FOR ICD-9 250.00, 250.01). 1 each 12  . cyclobenzaprine (FLEXERIL) 10 MG tablet Take 1 tablet (10 mg total) by mouth at bedtime. 30 tablet 2  . dexamethasone (DECADRON) 2 MG tablet Take 1 tablet (2 mg total) by mouth 2 (two) times daily. 40 tablet 0  . fluconazole (DIFLUCAN) 100 MG tablet Take 1 tablet (100 mg total) by mouth daily. 5 tablet 0  . Insulin Glargine (LANTUS) 100 UNIT/ML Solostar Pen Inject 36 Units into the skin daily at 10 pm. 4 pen 3  . lisinopril (PRINIVIL,ZESTRIL) 20 MG tablet  Take 1 tablet (20 mg total) by mouth daily. 30 tablet 5  . LORazepam (ATIVAN) 0.5 MG tablet Take 1 tablet (0.5 mg total) by mouth every 6 (six) hours as needed (Nausea or vomiting). 30 tablet 0  . meloxicam (MOBIC) 15 MG tablet Take 1 tablet (15 mg total) by mouth daily. 30 tablet 0  . menthol-cetylpyridinium (CEPACOL) 3 MG lozenge Take 1 lozenge (3 mg total) by mouth as needed for sore throat. 100 tablet 12  . metFORMIN (GLUCOPHAGE) 1000 MG tablet Take 1 tablet (1,000 mg total) by mouth 2 (two) times daily with a meal. 180 tablet 3  . ondansetron (ZOFRAN) 8 MG tablet Take 1 tablet (8 mg total) by mouth 2 (two) times daily as needed. Start on the third day after chemotherapy. 30 tablet 1  . oxyCODONE-acetaminophen (PERCOCET/ROXICET) 5-325 MG tablet Take 1 tablet by mouth  every 6 (six) hours as needed for severe pain. 40 tablet 0  . potassium chloride (K-DUR) 10 MEQ tablet Take 1 tablet (10 mEq total) by mouth 2 (two) times daily. 20 tablet 0  . prochlorperazine (COMPAZINE) 10 MG tablet Take 1 tablet (10 mg total) by mouth every 6 (six) hours as needed (Nausea or vomiting). 30 tablet 1  . sildenafil (REVATIO) 20 MG tablet 1-3 Tablet daily as needed ORAL 10 tablet 0  . sildenafil (VIAGRA) 50 MG tablet Take 50 mg by mouth daily as needed for erectile dysfunction.    . sucralfate (CARAFATE) 1 g tablet Take 1 tablet (1 g total) by mouth 3 (three) times daily. Dissolve in 2 to 3 tbs warm water, swish &swallow 90 tablet 3  . UNIFINE PENTIPS 31G X 6 MM MISC 4 (four) times daily.     No current facility-administered medications for this visit.     Review of Systems:  GENERAL:  Feels tired.  No fevers or sweats.  Weight down 11 pounds. PERFORMANCE STATUS (ECOG):  1 HEENT:  Vision little blurry secondary to diabetes.  Sore throat.  No runny nose or mouth sores. Lungs: No shortness of breath or cough.  No hemoptysis. Cardiac:  No chest pain, palpitations, orthopnea, or PND.  Took BP pill 1 hour ago. GI:  Appetite poor.  No nausea, vomiting, diarrhea, constipation, melena or hematochezia.  No prior colonoscopy. GU:  No urgency, frequency, dysuria, or hematuria.  Musculoskeletal:  No back pain.  Knee aches.  No muscle tenderness. Extremities:  No pain or swelling. Skin:  No rashes or skin changes. Neuro:  No headache, numbness or weakness, balance or coordination issues. Endocrine:  Diabetes.  No thyroid issues, hot flashes or night sweats. Psych:  No mood changes, depression or anxiety. Pain:  Throat pain. Review of systems:  All other systems reviewed and found to be negative.  Physical Exam: Blood pressure 121/89, pulse 99, temperature 97.3 F (36.3 C), temperature source Tympanic, resp. rate 18, weight 185 lb 3 oz (84 kg). GENERAL:  Fatigued appearing gentleman  sitting comfortably in the exam room in no acute distress. MENTAL STATUS:  Alert and oriented to person, place and time. HEAD:  Short gray hair.  Goatee.  Normocephalic, atraumatic, face symmetric, no Cushingoid features. EYES:  Blue eyes.  Pupils equal round and reactive to light and accomodation.  No conjunctivitis or scleral icterus. ENT:  Oropharynx with posterior pharynx thrush.  Tongue normal.  Mucous membranes moist.  RESPIRATORY:  Clear to auscultation without rales, wheezes or rhonchi. CARDIOVASCULAR:  Regular rate and rhythm without murmur, rub or gallop. ABDOMEN:  Soft, non-tender, with active bowel sounds, and no hepatosplenomegaly.  No masses. SKIN:  No rashes, ulcers or lesions. EXTREMITIES: No edema, no skin discoloration or tenderness.  No palpable cords. LYMPH NODES: Conglomerate left neck adenopathy 4 x 4.5 cm (stable).  No palpable supraclavicular, axillary or inguinal adenopathy  NEUROLOGICAL: Unremarkable. PSYCH:  Appropriate.   Appointment on 12/03/2015  Component Date Value Ref Range Status  . WBC 12/03/2015 3.3* 3.8 - 10.6 K/uL Final  . RBC 12/03/2015 4.26* 4.40 - 5.90 MIL/uL Final  . Hemoglobin 12/03/2015 13.0  13.0 - 18.0 g/dL Final  . HCT 12/03/2015 35.3* 40.0 - 52.0 % Final  . MCV 12/03/2015 82.9  80.0 - 100.0 fL Final  . MCH 12/03/2015 30.6  26.0 - 34.0 pg Final  . MCHC 12/03/2015 36.9* 32.0 - 36.0 g/dL Final  . RDW 12/03/2015 15.6* 11.5 - 14.5 % Final  . Platelets 12/03/2015 486* 150 - 440 K/uL Final  . Neutrophils Relative % 12/03/2015 62  % Final  . Neutro Abs 12/03/2015 2.0  1.4 - 6.5 K/uL Final  . Lymphocytes Relative 12/03/2015 14  % Final  . Lymphs Abs 12/03/2015 0.5* 1.0 - 3.6 K/uL Final  . Monocytes Relative 12/03/2015 24  % Final  . Monocytes Absolute 12/03/2015 0.8  0.2 - 1.0 K/uL Final  . Eosinophils Relative 12/03/2015 0  % Final  . Eosinophils Absolute 12/03/2015 0.0  0 - 0.7 K/uL Final  . Basophils Relative 12/03/2015 0  % Final  .  Basophils Absolute 12/03/2015 0.0  0 - 0.1 K/uL Final  . Sodium 12/03/2015 131* 135 - 145 mmol/L Final  . Potassium 12/03/2015 3.7  3.5 - 5.1 mmol/L Final  . Chloride 12/03/2015 96* 101 - 111 mmol/L Final  . CO2 12/03/2015 26  22 - 32 mmol/L Final  . Glucose, Bld 12/03/2015 135* 65 - 99 mg/dL Final  . BUN 12/03/2015 15  6 - 20 mg/dL Final  . Creatinine, Ser 12/03/2015 1.08  0.61 - 1.24 mg/dL Final  . Calcium 12/03/2015 9.4  8.9 - 10.3 mg/dL Final  . Total Protein 12/03/2015 7.6  6.5 - 8.1 g/dL Final  . Albumin 12/03/2015 4.1  3.5 - 5.0 g/dL Final  . AST 12/03/2015 20  15 - 41 U/L Final  . ALT 12/03/2015 25  17 - 63 U/L Final  . Alkaline Phosphatase 12/03/2015 87  38 - 126 U/L Final  . Total Bilirubin 12/03/2015 1.0  0.3 - 1.2 mg/dL Final  . GFR calc non Af Amer 12/03/2015 >60  >60 mL/min Final  . GFR calc Af Amer 12/03/2015 >60  >60 mL/min Final   Comment: (NOTE) The eGFR has been calculated using the CKD EPI equation. This calculation has not been validated in all clinical situations. eGFR's persistently <60 mL/min signify possible Chronic Kidney Disease.   . Anion gap 12/03/2015 9  5 - 15 Final  . Magnesium 12/03/2015 1.6* 1.7 - 2.4 mg/dL Final    Assessment:  Kent [name] is a 60 y.o. 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  Tumor was p16 IHC positive (high risk HPV).  CT soft tissue neck on 08/17/2015 revealed a slowly progressive over years, infiltrative, deep 4.1 x 5.4 cm LEFT parotid mass with widespread ipsilateral bulky adenopathy.  [facility] at [facility] on 09/17/2015 revealed left base of tongue malignancy corresponding to asymmetric enhancement identified on the neck CT with hypermetabolic extensive conglomerate left cervical adenopathy (level II-IV) extending from  the level of the angle of the mandible down inferiorly to the level of the cricoid cartilage.  There was clustered subcentimeter left level IB submandibular  nodes with mild FDG activity.  There was no evidence of metastatic disease.  Alpha gal IgE was 1.21 (< 0.35) c/w IgE antibodies to galactose-alpha-1,3 galactose (oligosaccharide part on Fab portion of the cetuximab heavy chain) and suggestive of potential cetuximab reaction.  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  Pathologic stage was T4NxM0.  He took imatinib for 1 year.  He is s/p 2 cycles of cisplatin (10/22/2015 - 11/12/2015) with concurrent radiation.  Left neck adenopathy has dramatically improved.  He has recurrent hyponatremia.  Sodium was 122 on 11/15/2015 prompting overnight admission for IVF (NS).  He has received daily IVF last week.  Sodium is 131.  Fluid intake is poor.    Symptomatically, he has a persistent sore throat.   Exam reveals posterior pharynx thrush secondary to low dose Decadon.  Left neck adenopathy is small.  Weight is down.  Magnesium is low (1.6).  Potassium is normal on oral potassium.  Plan: 1.  Labs today:  CBC with diff, CMP, Mg. 2.  Discuss importance of fluid and caloric intake.   3.  1 liter NS + 2 gm Mg sulfate today. 4.  Rx:  Magic mouthwash swish and spit.   5.  Discuss plan for fluconazole if no improvement in thrush. 6.  Postpone chemotherapy today. 7.  RTC tomorrrow for MD assessment, labs (CBC with diff, CMP, Mg) +/- cisplatin. 8.  RTC  12/05/2015 - 12/07/2015 for IVF +/- Mg.   Lequita Asal, MD  12/03/2015, 9:53 AM

## 2015-12-03 ENCOUNTER — Other Ambulatory Visit: Payer: Self-pay | Admitting: Hematology and Oncology

## 2015-12-03 ENCOUNTER — Inpatient Hospital Stay: Payer: Managed Care, Other (non HMO)

## 2015-12-03 ENCOUNTER — Telehealth: Payer: Self-pay | Admitting: *Deleted

## 2015-12-03 ENCOUNTER — Ambulatory Visit
Admission: RE | Admit: 2015-12-03 | Discharge: 2015-12-03 | Disposition: A | Discharge status: Home / Self Care | Payer: Managed Care, Other (non HMO) | Source: Ambulatory Visit | Attending: Radiation Oncology | Admitting: Radiation Oncology

## 2015-12-03 ENCOUNTER — Other Ambulatory Visit: Payer: Self-pay | Admitting: *Deleted

## 2015-12-03 ENCOUNTER — Inpatient Hospital Stay (HOSPITAL_BASED_OUTPATIENT_CLINIC_OR_DEPARTMENT_OTHER): Payer: Managed Care, Other (non HMO) | Admitting: Hematology and Oncology

## 2015-12-03 ENCOUNTER — Inpatient Hospital Stay: Payer: Managed Care, Other (non HMO) | Attending: Hematology and Oncology

## 2015-12-03 ENCOUNTER — Encounter: Payer: Self-pay | Admitting: Hematology and Oncology

## 2015-12-03 VITALS — BP 121/89 | HR 99 | Temp 97.3°F | Resp 18 | Wt 185.2 lb

## 2015-12-03 DIAGNOSIS — E785 Hyperlipidemia, unspecified: Secondary | ICD-10-CM | POA: Insufficient documentation

## 2015-12-03 DIAGNOSIS — B370 Candidal stomatitis: Secondary | ICD-10-CM | POA: Insufficient documentation

## 2015-12-03 DIAGNOSIS — D696 Thrombocytopenia, unspecified: Secondary | ICD-10-CM | POA: Insufficient documentation

## 2015-12-03 DIAGNOSIS — Z794 Long term (current) use of insulin: Secondary | ICD-10-CM | POA: Diagnosis not present

## 2015-12-03 DIAGNOSIS — E876 Hypokalemia: Secondary | ICD-10-CM | POA: Diagnosis not present

## 2015-12-03 DIAGNOSIS — Z809 Family history of malignant neoplasm, unspecified: Secondary | ICD-10-CM | POA: Diagnosis not present

## 2015-12-03 DIAGNOSIS — Y842 Radiological procedure and radiotherapy as the cause of abnormal reaction of the patient, or of later complication, without mention of misadventure at the time of the procedure: Secondary | ICD-10-CM | POA: Diagnosis not present

## 2015-12-03 DIAGNOSIS — R112 Nausea with vomiting, unspecified: Secondary | ICD-10-CM | POA: Diagnosis not present

## 2015-12-03 DIAGNOSIS — R531 Weakness: Secondary | ICD-10-CM

## 2015-12-03 DIAGNOSIS — Z800 Family history of malignant neoplasm of digestive organs: Secondary | ICD-10-CM

## 2015-12-03 DIAGNOSIS — E119 Type 2 diabetes mellitus without complications: Secondary | ICD-10-CM

## 2015-12-03 DIAGNOSIS — I10 Essential (primary) hypertension: Secondary | ICD-10-CM | POA: Insufficient documentation

## 2015-12-03 DIAGNOSIS — Z7984 Long term (current) use of oral hypoglycemic drugs: Secondary | ICD-10-CM

## 2015-12-03 DIAGNOSIS — K1231 Oral mucositis (ulcerative) due to antineoplastic therapy: Secondary | ICD-10-CM | POA: Insufficient documentation

## 2015-12-03 DIAGNOSIS — E669 Obesity, unspecified: Secondary | ICD-10-CM | POA: Insufficient documentation

## 2015-12-03 DIAGNOSIS — Z85028 Personal history of other malignant neoplasm of stomach: Secondary | ICD-10-CM | POA: Diagnosis not present

## 2015-12-03 DIAGNOSIS — R630 Anorexia: Secondary | ICD-10-CM | POA: Insufficient documentation

## 2015-12-03 DIAGNOSIS — J029 Acute pharyngitis, unspecified: Secondary | ICD-10-CM | POA: Insufficient documentation

## 2015-12-03 DIAGNOSIS — M129 Arthropathy, unspecified: Secondary | ICD-10-CM

## 2015-12-03 DIAGNOSIS — C01 Malignant neoplasm of base of tongue: Secondary | ICD-10-CM

## 2015-12-03 DIAGNOSIS — Z8043 Family history of malignant neoplasm of testis: Secondary | ICD-10-CM

## 2015-12-03 DIAGNOSIS — Z79899 Other long term (current) drug therapy: Secondary | ICD-10-CM

## 2015-12-03 DIAGNOSIS — Z5111 Encounter for antineoplastic chemotherapy: Secondary | ICD-10-CM | POA: Insufficient documentation

## 2015-12-03 DIAGNOSIS — E871 Hypo-osmolality and hyponatremia: Secondary | ICD-10-CM | POA: Diagnosis not present

## 2015-12-03 LAB — COMPREHENSIVE METABOLIC PANEL
ALT: 25 U/L (ref 17–63)
AST: 20 U/L (ref 15–41)
Albumin: 4.1 g/dL (ref 3.5–5.0)
Alkaline Phosphatase: 87 U/L (ref 38–126)
Anion gap: 9 (ref 5–15)
BUN: 15 mg/dL (ref 6–20)
CO2: 26 mmol/L (ref 22–32)
Calcium: 9.4 mg/dL (ref 8.9–10.3)
Chloride: 96 mmol/L — ABNORMAL LOW (ref 101–111)
Creatinine, Ser: 1.08 mg/dL (ref 0.61–1.24)
GFR calc Af Amer: >60 mL/min (ref >60)
GFR calc non Af Amer: >60 mL/min (ref >60)
Glucose, Bld: 135 mg/dL — ABNORMAL HIGH (ref 65–99)
Potassium: 3.7 mmol/L (ref 3.5–5.1)
Sodium: 131 mmol/L — ABNORMAL LOW (ref 135–145)
Total Bilirubin: 1 mg/dL (ref 0.3–1.2)
Total Protein: 7.6 g/dL (ref 6.5–8.1)

## 2015-12-03 LAB — CBC WITH DIFFERENTIAL/PLATELET
Basophils Absolute: 0 10*3/uL (ref 0–0.1)
Basophils Relative: 0 %
Eosinophils Absolute: 0 10*3/uL (ref 0–0.7)
Eosinophils Relative: 0 %
HCT: 35.3 % — ABNORMAL LOW (ref 40.0–52.0)
Hemoglobin: 13 g/dL (ref 13.0–18.0)
Lymphocytes Relative: 14 %
Lymphs Abs: 0.5 10*3/uL — ABNORMAL LOW (ref 1.0–3.6)
MCH: 30.6 pg (ref 26.0–34.0)
MCHC: 36.9 g/dL — ABNORMAL HIGH (ref 32.0–36.0)
MCV: 82.9 fL (ref 80.0–100.0)
Monocytes Absolute: 0.8 10*3/uL (ref 0.2–1.0)
Monocytes Relative: 24 %
Neutro Abs: 2 10*3/uL (ref 1.4–6.5)
Neutrophils Relative %: 62 %
Platelets: 486 10*3/uL — ABNORMAL HIGH (ref 150–440)
RBC: 4.26 MIL/uL — ABNORMAL LOW (ref 4.40–5.90)
RDW: 15.6 % — ABNORMAL HIGH (ref 11.5–14.5)
WBC: 3.3 10*3/uL — ABNORMAL LOW (ref 3.8–10.6)

## 2015-12-03 LAB — MAGNESIUM: Magnesium: 1.6 mg/dL — ABNORMAL LOW (ref 1.7–2.4)

## 2015-12-03 MED ORDER — MAGIC MOUTHWASH: 5.0000 mL | Freq: Four times a day (QID) | ORAL | 1 refills | Status: DC | PRN

## 2015-12-03 MED ORDER — MAGNESIUM SULFATE 2 GM/50ML IV SOLN
2.0000 g | Freq: Once | INTRAVENOUS | Status: AC
  Administered 2015-12-03: 2 g via INTRAVENOUS
  Filled 2015-12-03: qty 50

## 2015-12-03 MED ORDER — SODIUM CHLORIDE 0.9 % IV SOLN
Freq: Once | INTRAVENOUS | Status: AC
  Administered 2015-12-03: 11:00:00 via INTRAVENOUS
  Filled 2015-12-03: qty 1000

## 2015-12-03 NOTE — Telephone Encounter (Signed)
Called in the [facility] into his pharmacy since the rx would not electronically go through. Patient was told this am that it would be called in

## 2015-12-03 NOTE — Progress Notes (Signed)
Patient states he is still unable to eat.  Complains of being weak and tired. He is drinking boost.  States he can drink okay. Using throat spray to numb his throat.  Also takes Percocet at night to help him sleep.

## 2015-12-04 ENCOUNTER — Encounter: Payer: Self-pay | Admitting: Hematology and Oncology

## 2015-12-04 ENCOUNTER — Ambulatory Visit
Admission: RE | Admit: 2015-12-04 | Discharge: 2015-12-04 | Disposition: A | Discharge status: Home / Self Care | Payer: Managed Care, Other (non HMO) | Source: Ambulatory Visit | Attending: Radiation Oncology | Admitting: Radiation Oncology

## 2015-12-04 ENCOUNTER — Inpatient Hospital Stay (HOSPITAL_BASED_OUTPATIENT_CLINIC_OR_DEPARTMENT_OTHER): Payer: Managed Care, Other (non HMO) | Admitting: Hematology and Oncology

## 2015-12-04 ENCOUNTER — Other Ambulatory Visit: Payer: Self-pay | Admitting: *Deleted

## 2015-12-04 ENCOUNTER — Inpatient Hospital Stay: Payer: Managed Care, Other (non HMO)

## 2015-12-04 VITALS — BP 134/96 | HR 88 | Temp 97.1°F | Resp 18 | Wt 186.7 lb

## 2015-12-04 DIAGNOSIS — B370 Candidal stomatitis: Secondary | ICD-10-CM

## 2015-12-04 DIAGNOSIS — E785 Hyperlipidemia, unspecified: Secondary | ICD-10-CM

## 2015-12-04 DIAGNOSIS — Z809 Family history of malignant neoplasm, unspecified: Secondary | ICD-10-CM

## 2015-12-04 DIAGNOSIS — E871 Hypo-osmolality and hyponatremia: Secondary | ICD-10-CM

## 2015-12-04 DIAGNOSIS — I10 Essential (primary) hypertension: Secondary | ICD-10-CM

## 2015-12-04 DIAGNOSIS — R630 Anorexia: Secondary | ICD-10-CM

## 2015-12-04 DIAGNOSIS — M129 Arthropathy, unspecified: Secondary | ICD-10-CM

## 2015-12-04 DIAGNOSIS — C01 Malignant neoplasm of base of tongue: Secondary | ICD-10-CM | POA: Diagnosis not present

## 2015-12-04 DIAGNOSIS — Z794 Long term (current) use of insulin: Secondary | ICD-10-CM

## 2015-12-04 DIAGNOSIS — E669 Obesity, unspecified: Secondary | ICD-10-CM

## 2015-12-04 DIAGNOSIS — E119 Type 2 diabetes mellitus without complications: Secondary | ICD-10-CM

## 2015-12-04 DIAGNOSIS — J029 Acute pharyngitis, unspecified: Secondary | ICD-10-CM

## 2015-12-04 DIAGNOSIS — Z8043 Family history of malignant neoplasm of testis: Secondary | ICD-10-CM

## 2015-12-04 DIAGNOSIS — D696 Thrombocytopenia, unspecified: Secondary | ICD-10-CM | POA: Diagnosis not present

## 2015-12-04 DIAGNOSIS — Z85028 Personal history of other malignant neoplasm of stomach: Secondary | ICD-10-CM

## 2015-12-04 DIAGNOSIS — Z7984 Long term (current) use of oral hypoglycemic drugs: Secondary | ICD-10-CM

## 2015-12-04 DIAGNOSIS — Z79899 Other long term (current) drug therapy: Secondary | ICD-10-CM

## 2015-12-04 DIAGNOSIS — Z800 Family history of malignant neoplasm of digestive organs: Secondary | ICD-10-CM

## 2015-12-04 DIAGNOSIS — Z5111 Encounter for antineoplastic chemotherapy: Secondary | ICD-10-CM

## 2015-12-04 DIAGNOSIS — R531 Weakness: Secondary | ICD-10-CM

## 2015-12-04 LAB — CBC WITH DIFFERENTIAL/PLATELET
Basophils Absolute: 0 10*3/uL (ref 0–0.1)
Basophils Relative: 1 %
Eosinophils Absolute: 0 10*3/uL (ref 0–0.7)
Eosinophils Relative: 1 %
HCT: 32.5 % — ABNORMAL LOW (ref 40.0–52.0)
Hemoglobin: 12 g/dL — ABNORMAL LOW (ref 13.0–18.0)
Lymphocytes Relative: 12 %
Lymphs Abs: 0.6 10*3/uL — ABNORMAL LOW (ref 1.0–3.6)
MCH: 30.2 pg (ref 26.0–34.0)
MCHC: 36.9 g/dL — ABNORMAL HIGH (ref 32.0–36.0)
MCV: 81.7 fL (ref 80.0–100.0)
Monocytes Absolute: 1 10*3/uL (ref 0.2–1.0)
Monocytes Relative: 22 %
Neutro Abs: 3 10*3/uL (ref 1.4–6.5)
Neutrophils Relative %: 64 %
Platelets: 435 10*3/uL (ref 150–440)
RBC: 3.98 MIL/uL — ABNORMAL LOW (ref 4.40–5.90)
RDW: 15.8 % — ABNORMAL HIGH (ref 11.5–14.5)
WBC: 4.6 10*3/uL (ref 3.8–10.6)

## 2015-12-04 LAB — COMPREHENSIVE METABOLIC PANEL
ALT: 21 U/L (ref 17–63)
AST: 18 U/L (ref 15–41)
Albumin: 3.9 g/dL (ref 3.5–5.0)
Alkaline Phosphatase: 75 U/L (ref 38–126)
Anion gap: 9 (ref 5–15)
BUN: 11 mg/dL (ref 6–20)
CO2: 24 mmol/L (ref 22–32)
Calcium: 8.8 mg/dL — ABNORMAL LOW (ref 8.9–10.3)
Chloride: 99 mmol/L — ABNORMAL LOW (ref 101–111)
Creatinine, Ser: 0.84 mg/dL (ref 0.61–1.24)
GFR calc Af Amer: >60 mL/min (ref >60)
GFR calc non Af Amer: >60 mL/min (ref >60)
Glucose, Bld: 112 mg/dL — ABNORMAL HIGH (ref 65–99)
Potassium: 3.6 mmol/L (ref 3.5–5.1)
Sodium: 132 mmol/L — ABNORMAL LOW (ref 135–145)
Total Bilirubin: 0.8 mg/dL (ref 0.3–1.2)
Total Protein: 6.7 g/dL (ref 6.5–8.1)

## 2015-12-04 LAB — MAGNESIUM: Magnesium: 1.7 mg/dL (ref 1.7–2.4)

## 2015-12-04 MED ORDER — SODIUM CHLORIDE 0.9 % IV SOLN
100.0000 mg/m2 | Freq: Once | INTRAVENOUS | Status: AC
  Administered 2015-12-04: 212 mg via INTRAVENOUS
  Filled 2015-12-04: qty 212

## 2015-12-04 MED ORDER — SODIUM CHLORIDE 0.9 % IV SOLN
INTRAVENOUS | Status: DC
  Administered 2015-12-04: 10:00:00 via INTRAVENOUS
  Filled 2015-12-04: qty 1000

## 2015-12-04 MED ORDER — POTASSIUM CHLORIDE 2 MEQ/ML IV SOLN
Freq: Once | INTRAVENOUS | Status: AC
  Administered 2015-12-04: 11:00:00 via INTRAVENOUS
  Filled 2015-12-04: qty 10

## 2015-12-04 MED ORDER — FLUCONAZOLE 100 MG PO TABS: ORAL_TABLET | ORAL | 0 refills | Status: DC

## 2015-12-04 MED ORDER — PALONOSETRON HCL INJECTION 0.25 MG/5ML
0.2500 mg | Freq: Once | INTRAVENOUS | Status: AC
  Administered 2015-12-04: 0.25 mg via INTRAVENOUS
  Filled 2015-12-04: qty 5

## 2015-12-04 MED ORDER — FOSAPREPITANT DIMEGLUMINE INJECTION 150 MG
Freq: Once | INTRAVENOUS | Status: AC
  Administered 2015-12-04: 13:00:00 via INTRAVENOUS
  Filled 2015-12-04: qty 5

## 2015-12-04 NOTE — Progress Notes (Signed)
[facility] day:  12/04/15  Chief Complaint: Kent [name] is a 60 y.o. 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  HPI:  The patient was last seen in the medical oncology clinic on  12/03/2015.  At that time, he complained of difficulty swallowing.  He was taking little by mouth.  He was on Decadron 2 mg BID.  Exam revealed posterior pharynx thrush.  Labs revealed mild hyponatremia (sodium 131), hypomagnesemia (Mg 1.6), and slight increased creatinine (1.08).  WBC had dropped to 3300 ([place]oot 2000).  He received IVF and IV magnesium.   He was started on Magic mouthwash.  He states that he used some of his wife's magic mouthwash.  His throat felt better.  He is taking potassium 10 meq BID.  He ate at [employer] last night.  He ate sliders on the right side of his mouth without pain.  He drank tea and feels much better today.  He is ready to receive chemotherapy.   Past Medical History:  Diagnosis Date  . Arthritis   . Diabetes mellitus without complication ([place])   . Hyperlipidemia   . Hypertension   . Obesity   . Stomach cancer ([place])    had tumor removed  . Tongue cancer [facility])     Past Surgical History:  Procedure Laterality Date  . gastric tumor removed  2010    Family History  Problem Relation Age of Onset  . Heart disease Mother   . Hypertension Father   . Cancer Sister     stomach  . Heart disease Brother     MI  . Cancer Daughter   . Cancer Son     testicular  . ADD / ADHD Sister     Social History:  reports that he has never smoked. He has never used smokeless tobacco. He reports that he does not drink alcohol or use drugs.  He smokes a pipe and cigar rarely (once every 3 months).  He has a son and daughter.  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  He has planned (and paid for)  a trip to the beach from 10/15/2015 - 10/20/2015.  He was no short term disability.  He has 10 weeks of vacation and sick leave.  The patient is alone today.  Allergies: No Known Allergies  Current Medications: Current Outpatient Prescriptions  Medication Sig Dispense Refill  . atorvastatin (LIPITOR) 20 MG tablet Take 1 tablet (20 mg total) by mouth daily. Reported on 04/06/2015 90 tablet 1  . blood glucose meter kit and supplies KIT Dispense based on patient and insurance preference. Use up to four times daily as directed. (FOR ICD-9 250.00, 250.01). 1 each 12  . cyclobenzaprine (FLEXERIL) 10 MG tablet Take 1 tablet (10 mg total) by mouth at bedtime. 30 tablet 2  . dexamethasone (DECADRON) 2 MG tablet Take 1 tablet (2 mg total) by mouth 2 (two) times daily. 40 tablet 0  . fluconazole (DIFLUCAN) 100 MG tablet Take 2 pills (200 mg) on day 1 then 1 pill (100 mg) a day for 10 days 11 tablet 0  . Insulin Glargine (LANTUS) 100 UNIT/ML Solostar Pen Inject 36 Units into the skin daily at 10 pm. 4 pen 3  . lisinopril (PRINIVIL,ZESTRIL) 20 MG tablet Take 1 tablet (20 mg total) by mouth  daily. 30 tablet 5  . LORazepam (ATIVAN) 0.5 MG tablet Take 1 tablet (0.5 mg total) by mouth every 6 (six) hours as needed (Nausea or vomiting). 30 tablet 0  . magic mouthwash SOLN Take 5 mLs by mouth 4 (four) times daily as needed for mouth pain. 480 mL 1  . meloxicam (MOBIC) 15 MG tablet Take 1 tablet (15 mg total) by mouth daily. 30 tablet 0  . menthol-cetylpyridinium (CEPACOL) 3 MG lozenge Take 1 lozenge (3 mg total) by mouth as needed for sore throat. 100 tablet 12  . metFORMIN (GLUCOPHAGE) 1000 MG tablet Take 1 tablet (1,000 mg total) by mouth 2 (two) times daily with a meal. 180 tablet 3  . ondansetron (ZOFRAN) 8 MG tablet Take 1 tablet (8 mg total) by mouth 2 (two) times daily as needed. Start on the third day after chemotherapy. 30 tablet 1  . oxyCODONE-acetaminophen (PERCOCET/ROXICET) 5-325 MG tablet Take 1 tablet  by mouth every 6 (six) hours as needed for severe pain. 40 tablet 0  . potassium chloride (K-DUR) 10 MEQ tablet Take 1 tablet (10 mEq total) by mouth 2 (two) times daily. 20 tablet 0  . prochlorperazine (COMPAZINE) 10 MG tablet Take 1 tablet (10 mg total) by mouth every 6 (six) hours as needed (Nausea or vomiting). 30 tablet 1  . sildenafil (REVATIO) 20 MG tablet 1-3 Tablet daily as needed ORAL 10 tablet 0  . sildenafil (VIAGRA) 50 MG tablet Take 50 mg by mouth daily as needed for erectile dysfunction.    . sucralfate (CARAFATE) 1 g tablet Take 1 tablet (1 g total) by mouth 3 (three) times daily. Dissolve in 2 to 3 tbs warm water, swish &swallow 90 tablet 3  . UNIFINE PENTIPS 31G X 6 MM MISC 4 (four) times daily.     No current facility-administered medications for this visit.    Facility-Administered Medications Ordered in Other Visits  Medication Dose Route Frequency Provider Last Rate Last Dose  . 0.9 %  sodium chloride infusion   Intravenous Continuous Lequita Asal, MD   Stopped at 12/04/15 1515    Review of Systems:  GENERAL:  Feels "pretty good".  No fevers or sweats.  Weight up 1 pound. PERFORMANCE STATUS (ECOG):  1 HEENT:  Vision little blurry secondary to diabetes.  Sore throat.  No runny nose or mouth sores. Lungs: No shortness of breath or cough.  No hemoptysis. Cardiac:  No chest pain, palpitations, orthopnea, or PND.  GI:  Appetite little better.  No nausea, vomiting, diarrhea, constipation, melena or hematochezia.  No prior colonoscopy. GU:  No urgency, frequency, dysuria, or hematuria.  Musculoskeletal:  No back pain.  Knee aches.  No muscle tenderness. Extremities:  No pain or swelling. Skin:  No rashes or skin changes. Neuro:  No headache, numbness or weakness, balance or coordination issues. Endocrine:  Diabetes.  No thyroid issues, hot flashes or night sweats. Psych:  No mood changes, depression or anxiety. Pain:  Throat pain. Review of systems:  All other  systems reviewed and found to be negative.  Physical Exam: Blood pressure (!) 134/96, pulse 88, temperature 97.1 F (36.2 C), temperature source Tympanic, resp. rate 18, weight 186 lb 11.7 oz (84.7 kg). GENERAL:  Slightly fatigued appearing (improved) gentleman sitting comfortably in the exam room in no acute distress. MENTAL STATUS:  Alert and oriented to person, place and time. HEAD:  Short gray hair.  Goatee.  Normocephalic, atraumatic, face symmetric, no Cushingoid features. EYES:  Blue eyes.  Pupils equal round and reactive to light and accomodation.  No conjunctivitis or scleral icterus. ENT:  Oropharynx with posterior pharynx thrush.  Tongue normal.  Mucous membranes moist.  RESPIRATORY:  Clear to auscultation without rales, wheezes or rhonchi. CARDIOVASCULAR:  Regular rate and rhythm without murmur, rub or gallop. ABDOMEN:  Soft, non-tender, with active bowel sounds, and no hepatosplenomegaly.  No masses. SKIN:  No rashes, ulcers or lesions. EXTREMITIES: No edema, no skin discoloration or tenderness.  No palpable cords. LYMPH NODES: Conglomerate soft left neck adenopathy.  No palpable supraclavicular, axillary or inguinal adenopathy  NEUROLOGICAL: Unremarkable. PSYCH:  Appropriate.  Affect brighter.   Appointment on 12/04/2015  Component Date Value Ref Range Status  . WBC 12/04/2015 4.6  3.8 - 10.6 K/uL Final  . RBC 12/04/2015 3.98* 4.40 - 5.90 MIL/uL Final  . Hemoglobin 12/04/2015 12.0* 13.0 - 18.0 g/dL Final  . HCT 12/04/2015 32.5* 40.0 - 52.0 % Final  . MCV 12/04/2015 81.7  80.0 - 100.0 fL Final  . MCH 12/04/2015 30.2  26.0 - 34.0 pg Final  . MCHC 12/04/2015 36.9* 32.0 - 36.0 g/dL Final  . RDW 12/04/2015 15.8* 11.5 - 14.5 % Final  . Platelets 12/04/2015 435  150 - 440 K/uL Final  . Neutrophils Relative % 12/04/2015 64  % Final  . Neutro Abs 12/04/2015 3.0  1.4 - 6.5 K/uL Final  . Lymphocytes Relative 12/04/2015 12  % Final  . Lymphs Abs 12/04/2015 0.6* 1.0 - 3.6 K/uL  Final  . Monocytes Relative 12/04/2015 22  % Final  . Monocytes Absolute 12/04/2015 1.0  0.2 - 1.0 K/uL Final  . Eosinophils Relative 12/04/2015 1  % Final  . Eosinophils Absolute 12/04/2015 0.0  0 - 0.7 K/uL Final  . Basophils Relative 12/04/2015 1  % Final  . Basophils Absolute 12/04/2015 0.0  0 - 0.1 K/uL Final  . Sodium 12/04/2015 132* 135 - 145 mmol/L Final  . Potassium 12/04/2015 3.6  3.5 - 5.1 mmol/L Final  . Chloride 12/04/2015 99* 101 - 111 mmol/L Final  . CO2 12/04/2015 24  22 - 32 mmol/L Final  . Glucose, Bld 12/04/2015 112* 65 - 99 mg/dL Final  . BUN 12/04/2015 11  6 - 20 mg/dL Final  . Creatinine, Ser 12/04/2015 0.84  0.61 - 1.24 mg/dL Final  . Calcium 12/04/2015 8.8* 8.9 - 10.3 mg/dL Final  . Total Protein 12/04/2015 6.7  6.5 - 8.1 g/dL Final  . Albumin 12/04/2015 3.9  3.5 - 5.0 g/dL Final  . AST 12/04/2015 18  15 - 41 U/L Final  . ALT 12/04/2015 21  17 - 63 U/L Final  . Alkaline Phosphatase 12/04/2015 75  38 - 126 U/L Final  . Total Bilirubin 12/04/2015 0.8  0.3 - 1.2 mg/dL Final  . GFR calc non Af Amer 12/04/2015 >60  >60 mL/min Final  . GFR calc Af Amer 12/04/2015 >60  >60 mL/min Final   Comment: (NOTE) The eGFR has been calculated using the CKD EPI equation. This calculation has not been validated in all clinical situations. eGFR's persistently <60 mL/min signify possible Chronic Kidney Disease.   . Anion gap 12/04/2015 9  5 - 15 Final  . Magnesium 12/04/2015 1.7  1.7 - 2.4 mg/dL Final  Appointment on 12/03/2015  Component Date Value Ref Range Status  . WBC 12/03/2015 3.3* 3.8 - 10.6 K/uL Final  . RBC 12/03/2015 4.26* 4.40 - 5.90 MIL/uL Final  . Hemoglobin 12/03/2015 13.0  13.0 - 18.0 g/dL Final  .  HCT 12/03/2015 35.3* 40.0 - 52.0 % Final  . MCV 12/03/2015 82.9  80.0 - 100.0 fL Final  . MCH 12/03/2015 30.6  26.0 - 34.0 pg Final  . MCHC 12/03/2015 36.9* 32.0 - 36.0 g/dL Final  . RDW 12/03/2015 15.6* 11.5 - 14.5 % Final  . Platelets 12/03/2015 486* 150 - 440  K/uL Final  . Neutrophils Relative % 12/03/2015 62  % Final  . Neutro Abs 12/03/2015 2.0  1.4 - 6.5 K/uL Final  . Lymphocytes Relative 12/03/2015 14  % Final  . Lymphs Abs 12/03/2015 0.5* 1.0 - 3.6 K/uL Final  . Monocytes Relative 12/03/2015 24  % Final  . Monocytes Absolute 12/03/2015 0.8  0.2 - 1.0 K/uL Final  . Eosinophils Relative 12/03/2015 0  % Final  . Eosinophils Absolute 12/03/2015 0.0  0 - 0.7 K/uL Final  . Basophils Relative 12/03/2015 0  % Final  . Basophils Absolute 12/03/2015 0.0  0 - 0.1 K/uL Final  . Sodium 12/03/2015 131* 135 - 145 mmol/L Final  . Potassium 12/03/2015 3.7  3.5 - 5.1 mmol/L Final  . Chloride 12/03/2015 96* 101 - 111 mmol/L Final  . CO2 12/03/2015 26  22 - 32 mmol/L Final  . Glucose, Bld 12/03/2015 135* 65 - 99 mg/dL Final  . BUN 12/03/2015 15  6 - 20 mg/dL Final  . Creatinine, Ser 12/03/2015 1.08  0.61 - 1.24 mg/dL Final  . Calcium 12/03/2015 9.4  8.9 - 10.3 mg/dL Final  . Total Protein 12/03/2015 7.6  6.5 - 8.1 g/dL Final  . Albumin 12/03/2015 4.1  3.5 - 5.0 g/dL Final  . AST 12/03/2015 20  15 - 41 U/L Final  . ALT 12/03/2015 25  17 - 63 U/L Final  . Alkaline Phosphatase 12/03/2015 87  38 - 126 U/L Final  . Total Bilirubin 12/03/2015 1.0  0.3 - 1.2 mg/dL Final  . GFR calc non Af Amer 12/03/2015 >60  >60 mL/min Final  . GFR calc Af Amer 12/03/2015 >60  >60 mL/min Final   Comment: (NOTE) The eGFR has been calculated using the CKD EPI equation. This calculation has not been validated in all clinical situations. eGFR's persistently <60 mL/min signify possible Chronic Kidney Disease.   . Anion gap 12/03/2015 9  5 - 15 Final  . Magnesium 12/03/2015 1.6* 1.7 - 2.4 mg/dL Final    Assessment:  Boris Engelmann Lewis is a 60 y.o. 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  Tumor was p16 IHC positive (high risk HPV).  CT soft tissue neck on 08/17/2015 revealed a slowly progressive over years,  infiltrative, deep 4.1 x 5.4 cm LEFT parotid mass with widespread ipsilateral bulky adenopathy.  PET scan at [facility] on 09/17/2015 revealed left base of tongue malignancy corresponding to asymmetric enhancement identified on the neck CT with hypermetabolic extensive conglomerate left cervical adenopathy (level II-IV) extending from the level of the angle of the mandible down inferiorly to the level of the cricoid cartilage.  There was clustered subcentimeter left level IB submandibular nodes with mild FDG activity.  There was no evidence of metastatic disease.  Alpha gal IgE was 1.21 (< 0.35) c/w IgE antibodies to galactose-alpha-1,3 galactose (oligosaccharide part on Fab portion of the cetuximab heavy chain) and suggestive of potential cetuximab reaction.  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  Pathologic  stage was T4NxM0.  He took imatinib for 1 year.  He is s/p 2 cycles of cisplatin (10/22/2015 - 11/12/2015) with concurrent radiation.  Left neck adenopathy has dramatically improved.  He has recurrent hyponatremia.  Sodium was 122 on 11/15/2015 prompting overnight admission for IVF (NS).  He has received daily IVF last week.   Symptomatically, he feels better today.  He ate well last evening.  Fluid intake has improved.  Sodium is 132.  Potassium is normal on supplementation.  Magnesium is normal s/p IV magnesium yesterday.  He has thrush.  Plan: 1.  Labs today:  CBC with diff, CMP, Mg. 2.  Cycle #3 cisplatin today. 3.  Rx:  fluconazole 200 mg po today then 100 mg po q day (10 day supply). 4.  Continue oral potassium. 5.  Continue good oral hydration and caloric intake. 6.  Remind patient to take post cisplatin Decadron. 7.  RTC daily for IVF this week 8.  RTC on 12/07/2015 for BMP +/- IV Mg 9.  RTC on 12/10/2015 for MD assessment, labs (CBC with diff, BMP, Mg) +/- IVF and  Mg   Lequita Asal, MD  12/04/2015, 3:43 PM

## 2015-12-04 NOTE — Progress Notes (Signed)
Patient states he was able to eat a little bit yesterday.  States he is feeling a little better today.  BP elevated - 140/103.  States he did not take his BP medication this morning.  Recheck - 134/96 HR 88.  Patient states he was unable to pick up his magic mouthwash but used some his wife had at home. Will pick it up today.

## 2015-12-05 ENCOUNTER — Other Ambulatory Visit: Payer: Self-pay | Admitting: *Deleted

## 2015-12-05 ENCOUNTER — Ambulatory Visit
Admission: RE | Admit: 2015-12-05 | Discharge: 2015-12-05 | Disposition: A | Discharge status: Home / Self Care | Payer: Managed Care, Other (non HMO) | Source: Ambulatory Visit | Attending: Radiation Oncology | Admitting: Radiation Oncology

## 2015-12-05 ENCOUNTER — Inpatient Hospital Stay: Payer: Managed Care, Other (non HMO)

## 2015-12-05 ENCOUNTER — Telehealth: Payer: Self-pay | Admitting: *Deleted

## 2015-12-05 DIAGNOSIS — C01 Malignant neoplasm of base of tongue: Secondary | ICD-10-CM | POA: Diagnosis not present

## 2015-12-05 DIAGNOSIS — E871 Hypo-osmolality and hyponatremia: Secondary | ICD-10-CM

## 2015-12-05 MED ORDER — OXYCODONE-ACETAMINOPHEN 5-325 MG PO TABS: 1.0000 | ORAL_TABLET | Freq: Four times a day (QID) | ORAL | 0 refills | Status: DC | PRN

## 2015-12-05 NOTE — Telephone Encounter (Signed)
Patient requesting refill for Percocet.  Received #40 on September 22.   Per Dr. Mike Gip - okay to refill.   Called back to [place] in triage and LVM that it is okay to refill.

## 2015-12-06 ENCOUNTER — Ambulatory Visit
Admission: RE | Admit: 2015-12-06 | Discharge: 2015-12-06 | Disposition: A | Discharge status: Home / Self Care | Payer: Managed Care, Other (non HMO) | Source: Ambulatory Visit | Attending: Radiation Oncology | Admitting: Radiation Oncology

## 2015-12-06 ENCOUNTER — Inpatient Hospital Stay: Payer: Managed Care, Other (non HMO)

## 2015-12-06 DIAGNOSIS — C01 Malignant neoplasm of base of tongue: Secondary | ICD-10-CM | POA: Diagnosis not present

## 2015-12-07 ENCOUNTER — Ambulatory Visit
Admission: RE | Admit: 2015-12-07 | Discharge: 2015-12-07 | Disposition: A | Discharge status: Home / Self Care | Payer: Managed Care, Other (non HMO) | Source: Ambulatory Visit | Attending: Radiation Oncology | Admitting: Radiation Oncology

## 2015-12-07 ENCOUNTER — Encounter: Payer: Self-pay | Admitting: Student

## 2015-12-07 ENCOUNTER — Other Ambulatory Visit: Payer: Self-pay

## 2015-12-07 ENCOUNTER — Inpatient Hospital Stay: Payer: Managed Care, Other (non HMO)

## 2015-12-07 ENCOUNTER — Inpatient Hospital Stay
Admission: AD | Admit: 2015-12-07 | Discharge: 2015-12-08 | DRG: 641 | Disposition: A | Discharge status: Home / Self Care | Payer: Managed Care, Other (non HMO) | Source: Ambulatory Visit | Attending: Internal Medicine | Admitting: Internal Medicine

## 2015-12-07 DIAGNOSIS — M129 Arthropathy, unspecified: Secondary | ICD-10-CM | POA: Diagnosis not present

## 2015-12-07 DIAGNOSIS — C01 Malignant neoplasm of base of tongue: Secondary | ICD-10-CM

## 2015-12-07 DIAGNOSIS — Z923 Personal history of irradiation: Secondary | ICD-10-CM | POA: Diagnosis not present

## 2015-12-07 DIAGNOSIS — Z85028 Personal history of other malignant neoplasm of stomach: Secondary | ICD-10-CM | POA: Diagnosis not present

## 2015-12-07 DIAGNOSIS — E871 Hypo-osmolality and hyponatremia: Principal | ICD-10-CM | POA: Diagnosis present

## 2015-12-07 DIAGNOSIS — Z794 Long term (current) use of insulin: Secondary | ICD-10-CM

## 2015-12-07 DIAGNOSIS — M1990 Unspecified osteoarthritis, unspecified site: Secondary | ICD-10-CM | POA: Diagnosis present

## 2015-12-07 DIAGNOSIS — D696 Thrombocytopenia, unspecified: Secondary | ICD-10-CM | POA: Diagnosis not present

## 2015-12-07 DIAGNOSIS — E860 Dehydration: Secondary | ICD-10-CM | POA: Diagnosis present

## 2015-12-07 DIAGNOSIS — E785 Hyperlipidemia, unspecified: Secondary | ICD-10-CM | POA: Diagnosis not present

## 2015-12-07 DIAGNOSIS — Z8043 Family history of malignant neoplasm of testis: Secondary | ICD-10-CM | POA: Diagnosis not present

## 2015-12-07 DIAGNOSIS — Z6828 Body mass index (BMI) 28.0-28.9, adult: Secondary | ICD-10-CM

## 2015-12-07 DIAGNOSIS — Z510 Encounter for antineoplastic radiation therapy: Secondary | ICD-10-CM | POA: Diagnosis not present

## 2015-12-07 DIAGNOSIS — Z9221 Personal history of antineoplastic chemotherapy: Secondary | ICD-10-CM | POA: Diagnosis not present

## 2015-12-07 DIAGNOSIS — N179 Acute kidney failure, unspecified: Secondary | ICD-10-CM | POA: Diagnosis present

## 2015-12-07 DIAGNOSIS — K1231 Oral mucositis (ulcerative) due to antineoplastic therapy: Secondary | ICD-10-CM | POA: Diagnosis not present

## 2015-12-07 DIAGNOSIS — E669 Obesity, unspecified: Secondary | ICD-10-CM | POA: Diagnosis not present

## 2015-12-07 DIAGNOSIS — I10 Essential (primary) hypertension: Secondary | ICD-10-CM | POA: Diagnosis not present

## 2015-12-07 DIAGNOSIS — Z809 Family history of malignant neoplasm, unspecified: Secondary | ICD-10-CM

## 2015-12-07 DIAGNOSIS — R531 Weakness: Secondary | ICD-10-CM | POA: Diagnosis not present

## 2015-12-07 DIAGNOSIS — Z800 Family history of malignant neoplasm of digestive organs: Secondary | ICD-10-CM | POA: Diagnosis not present

## 2015-12-07 DIAGNOSIS — C7989 Secondary malignant neoplasm of other specified sites: Secondary | ICD-10-CM | POA: Diagnosis present

## 2015-12-07 DIAGNOSIS — Z79899 Other long term (current) drug therapy: Secondary | ICD-10-CM | POA: Diagnosis not present

## 2015-12-07 DIAGNOSIS — J029 Acute pharyngitis, unspecified: Secondary | ICD-10-CM | POA: Diagnosis not present

## 2015-12-07 DIAGNOSIS — E876 Hypokalemia: Secondary | ICD-10-CM | POA: Diagnosis not present

## 2015-12-07 DIAGNOSIS — R630 Anorexia: Secondary | ICD-10-CM | POA: Diagnosis not present

## 2015-12-07 DIAGNOSIS — Z5111 Encounter for antineoplastic chemotherapy: Secondary | ICD-10-CM | POA: Diagnosis not present

## 2015-12-07 DIAGNOSIS — B370 Candidal stomatitis: Secondary | ICD-10-CM | POA: Diagnosis not present

## 2015-12-07 DIAGNOSIS — Y842 Radiological procedure and radiotherapy as the cause of abnormal reaction of the patient, or of later complication, without mention of misadventure at the time of the procedure: Secondary | ICD-10-CM | POA: Diagnosis not present

## 2015-12-07 DIAGNOSIS — R112 Nausea with vomiting, unspecified: Secondary | ICD-10-CM | POA: Diagnosis not present

## 2015-12-07 DIAGNOSIS — E119 Type 2 diabetes mellitus without complications: Secondary | ICD-10-CM | POA: Diagnosis present

## 2015-12-07 DIAGNOSIS — Z7984 Long term (current) use of oral hypoglycemic drugs: Secondary | ICD-10-CM | POA: Diagnosis not present

## 2015-12-07 LAB — MAGNESIUM: Magnesium: 1.6 mg/dL — ABNORMAL LOW (ref 1.7–2.4)

## 2015-12-07 LAB — BASIC METABOLIC PANEL
Anion gap: 9 (ref 5–15)
BUN: 32 mg/dL — ABNORMAL HIGH (ref 6–20)
CO2: 27 mmol/L (ref 22–32)
Calcium: 9 mg/dL (ref 8.9–10.3)
Chloride: 90 mmol/L — ABNORMAL LOW (ref 101–111)
Creatinine, Ser: 1.32 mg/dL — ABNORMAL HIGH (ref 0.61–1.24)
GFR calc Af Amer: >60 mL/min (ref >60)
GFR calc non Af Amer: 57 mL/min — ABNORMAL LOW (ref >60)
Glucose, Bld: 175 mg/dL — ABNORMAL HIGH (ref 65–99)
Potassium: 3.7 mmol/L (ref 3.5–5.1)
Sodium: 126 mmol/L — ABNORMAL LOW (ref 135–145)

## 2015-12-07 LAB — CBC
HCT: 31.9 % — ABNORMAL LOW (ref 40.0–52.0)
Hemoglobin: 11.5 g/dL — ABNORMAL LOW (ref 13.0–18.0)
MCH: 29.6 pg (ref 26.0–34.0)
MCHC: 35.9 g/dL (ref 32.0–36.0)
MCV: 82.5 fL (ref 80.0–100.0)
PLATELETS: 318 10*3/uL (ref 150–440)
RBC: 3.87 MIL/uL — AB (ref 4.40–5.90)
RDW: 16.5 % — AB (ref 11.5–14.5)
WBC: 11.5 10*3/uL — AB (ref 3.8–10.6)

## 2015-12-07 LAB — ALBUMIN: ALBUMIN: 3.3 g/dL — AB (ref 3.5–5.0)

## 2015-12-07 LAB — SODIUM, URINE, RANDOM: SODIUM UR: 78 mmol/L

## 2015-12-07 LAB — GLUCOSE, CAPILLARY
GLUCOSE-CAPILLARY: 133 mg/dL — AB (ref 65–99)
Glucose-Capillary: 132 mg/dL — ABNORMAL HIGH (ref 65–99)

## 2015-12-07 LAB — OSMOLALITY, URINE: OSMOLALITY UR: 440 mosm/kg (ref 300–900)

## 2015-12-07 LAB — CREATININE, URINE, RANDOM: CREATININE, URINE: 60 mg/dL

## 2015-12-07 MED ORDER — SODIUM CHLORIDE 0.9 % IV BOLUS (SEPSIS)
1000.0000 mL | Freq: Once | INTRAVENOUS | Status: AC
  Administered 2015-12-07: 18:00:00 1000 mL via INTRAVENOUS

## 2015-12-07 MED ORDER — ENOXAPARIN SODIUM 40 MG/0.4ML ~~LOC~~ SOLN
40.0000 mg | SUBCUTANEOUS | Status: DC
Start: 2015-12-07 — End: 2015-12-08
  Administered 2015-12-07: 23:00:00 40 mg via SUBCUTANEOUS
  Filled 2015-12-07: qty 0.4

## 2015-12-07 MED ORDER — MAGNESIUM SULFATE 2 GM/50ML IV SOLN
2.0000 g | Freq: Once | INTRAVENOUS | Status: AC
  Administered 2015-12-07: 20:00:00 2 g via INTRAVENOUS
  Filled 2015-12-07: qty 50

## 2015-12-07 MED ORDER — LISINOPRIL 20 MG PO TABS
20.0000 mg | ORAL_TABLET | Freq: Every day | ORAL | Status: DC
  Administered 2015-12-08: 09:00:00 20 mg via ORAL
  Filled 2015-12-07: qty 1

## 2015-12-07 MED ORDER — MENTHOL 3 MG MT LOZG
1.0000 | LOZENGE | OROMUCOSAL | Status: DC | PRN
  Filled 2015-12-07: qty 9

## 2015-12-07 MED ORDER — ALUM & MAG HYDROXIDE-SIMETH 200-200-20 MG/5ML PO SUSP
30.0000 mL | ORAL | Status: DC | PRN
  Administered 2015-12-07: 30 mL via ORAL
  Filled 2015-12-07: qty 30

## 2015-12-07 MED ORDER — POTASSIUM CHLORIDE IN NACL 20-0.9 MEQ/L-% IV SOLN
INTRAVENOUS | Status: DC
  Administered 2015-12-07 – 2015-12-08 (×2): via INTRAVENOUS
  Filled 2015-12-07 (×5): qty 1000

## 2015-12-07 MED ORDER — BISACODYL 10 MG RE SUPP: 10.0000 mg | Freq: Every day | RECTAL | Status: DC | PRN

## 2015-12-07 MED ORDER — INSULIN GLARGINE 100 UNIT/ML ~~LOC~~ SOLN
36.0000 [IU] | Freq: Every day | SUBCUTANEOUS | Status: DC
  Administered 2015-12-07: 22:00:00 36 [IU] via SUBCUTANEOUS
  Filled 2015-12-07 (×2): qty 0.36

## 2015-12-07 MED ORDER — LORAZEPAM 0.5 MG PO TABS: 0.5000 mg | ORAL_TABLET | Freq: Four times a day (QID) | ORAL | Status: DC | PRN

## 2015-12-07 MED ORDER — ATORVASTATIN CALCIUM 20 MG PO TABS: 20.0000 mg | ORAL_TABLET | Freq: Every day | ORAL | Status: DC

## 2015-12-07 MED ORDER — MAGIC MOUTHWASH: 5.0000 mL | Freq: Four times a day (QID) | ORAL | Status: DC | PRN

## 2015-12-07 MED ORDER — ONDANSETRON HCL 4 MG PO TABS: 4.0000 mg | ORAL_TABLET | Freq: Four times a day (QID) | ORAL | Status: DC | PRN

## 2015-12-07 MED ORDER — OXYCODONE-ACETAMINOPHEN 5-325 MG PO TABS: 1.0000 | ORAL_TABLET | Freq: Four times a day (QID) | ORAL | Status: DC | PRN

## 2015-12-07 MED ORDER — POTASSIUM CHLORIDE CRYS ER 10 MEQ PO TBCR
10.0000 meq | EXTENDED_RELEASE_TABLET | Freq: Two times a day (BID) | ORAL | Status: DC
  Administered 2015-12-07 – 2015-12-08 (×2): 10 meq via ORAL
  Filled 2015-12-07 (×4): qty 1

## 2015-12-07 MED ORDER — DEXAMETHASONE 4 MG PO TABS
2.0000 mg | ORAL_TABLET | Freq: Two times a day (BID) | ORAL | Status: DC
  Administered 2015-12-07 – 2015-12-08 (×2): 2 mg via ORAL
  Filled 2015-12-07: qty 0.5
  Filled 2015-12-07: qty 1
  Filled 2015-12-07: qty 0.5

## 2015-12-07 MED ORDER — POLYETHYLENE GLYCOL 3350 17 G PO PACK: 17.0000 g | PACK | Freq: Every day | ORAL | Status: DC | PRN

## 2015-12-07 MED ORDER — ENSURE ENLIVE PO LIQD
237.0000 mL | Freq: Three times a day (TID) | ORAL | Status: DC
  Administered 2015-12-07 – 2015-12-08 (×2): 237 mL via ORAL

## 2015-12-07 MED ORDER — CYCLOBENZAPRINE HCL 10 MG PO TABS
10.0000 mg | ORAL_TABLET | Freq: Every day | ORAL | Status: DC
  Administered 2015-12-07: 10 mg via ORAL
  Filled 2015-12-07: qty 1

## 2015-12-07 MED ORDER — SODIUM CHLORIDE 0.9 % IV SOLN
Freq: Once | INTRAVENOUS | Status: AC
  Administered 2015-12-07: 09:00:00 via INTRAVENOUS
  Filled 2015-12-07: qty 1000

## 2015-12-07 MED ORDER — ACETAMINOPHEN 650 MG RE SUPP: 650.0000 mg | Freq: Four times a day (QID) | RECTAL | Status: DC | PRN

## 2015-12-07 MED ORDER — ACETAMINOPHEN 325 MG PO TABS: 650.0000 mg | ORAL_TABLET | Freq: Four times a day (QID) | ORAL | Status: DC | PRN

## 2015-12-07 MED ORDER — SUCRALFATE 1 G PO TABS
1.0000 g | ORAL_TABLET | Freq: Three times a day (TID) | ORAL | Status: DC
  Administered 2015-12-07 – 2015-12-08 (×2): 1 g via ORAL
  Filled 2015-12-07 (×2): qty 1

## 2015-12-07 MED ORDER — ALBUTEROL SULFATE (2.5 MG/3ML) 0.083% IN NEBU: 2.5000 mg | INHALATION_SOLUTION | RESPIRATORY_TRACT | Status: DC | PRN

## 2015-12-07 MED ORDER — ONDANSETRON HCL 4 MG/2ML IJ SOLN: 4.0000 mg | Freq: Four times a day (QID) | INTRAMUSCULAR | Status: DC | PRN

## 2015-12-07 MED ORDER — MAGNESIUM SULFATE 2 GM/50ML IV SOLN
2.0000 g | Freq: Once | INTRAVENOUS | Status: AC
  Administered 2015-12-07: 2 g via INTRAVENOUS
  Filled 2015-12-07: qty 50

## 2015-12-07 MED ORDER — INSULIN ASPART 100 UNIT/ML ~~LOC~~ SOLN: 0.0000 [IU] | Freq: Three times a day (TID) | SUBCUTANEOUS | Status: DC

## 2015-12-07 MED ORDER — MELOXICAM 7.5 MG PO TABS
15.0000 mg | ORAL_TABLET | Freq: Every day | ORAL | Status: DC
Start: 2015-12-08 — End: 2015-12-08
  Administered 2015-12-08: 15 mg via ORAL
  Filled 2015-12-07: qty 2

## 2015-12-07 NOTE — H&P (Signed)
[facility] [place] NAME: Kent [name]    MR#:  [id number]  DATE OF BIRTH:  1956/01/28  DATE OF ADMISSION:  12/07/2015  PRIMARY CARE PHYSICIAN: Kathrine Haddock, NP   REQUESTING/REFERRING PHYSICIAN: Dr. Gerrit Friends  CHIEF COMPLAINT:  No chief complaint on file.  Hyponatremia  HISTORY OF PRESENT ILLNESS:  Kent [name]  is a 60 y.o. male with a known history of Head and neck cancer, hypertension, diabetes was been going through chemotherapy and radiation for his head and neck cancer has had worsening trouble swallowing and decreased oral intake. Today on routine blood work his sodium was found to be 126. Last sodium was 134. Patient was sent in as a direct admission from cancer center. He has had poor oral intake for the last week. Dark urine. Some nausea but no vomiting. Afebrile. No shortness of breath or abdominal pain. No dysuria. No recent antibiotic use. Has been taking his insulin regularly.  PAST MEDICAL HISTORY:   Past Medical History:  Diagnosis Date  . Arthritis   . Diabetes mellitus without complication ([place])   . Hyperlipidemia   . Hypertension   . Obesity   . Stomach cancer ([place])    had tumor removed  . Tongue cancer ([place])     PAST SURGICAL HISTORY:   Past Surgical History:  Procedure Laterality Date  . gastric tumor removed  2010    SOCIAL HISTORY:   Social History  Substance Use Topics  . Smoking status: Never Smoker  . Smokeless tobacco: Never Used  . Alcohol use No    FAMILY HISTORY:   Family History  Problem Relation Age of Onset  . Heart disease Mother   . Hypertension Father   . Cancer Sister     stomach  . Heart disease Brother     MI  . Cancer Daughter   . Cancer Son     testicular  . ADD / ADHD Sister     DRUG ALLERGIES:  No Known Allergies  REVIEW OF SYSTEMS:   Review of Systems  Constitutional: Positive for malaise/fatigue. Negative for chills, fever and weight loss.  HENT:  Positive for sore throat. Negative for hearing loss and nosebleeds.   Eyes: Negative for blurred vision, double vision and pain.  Respiratory: Negative for cough, hemoptysis, sputum production, shortness of breath and wheezing.   Cardiovascular: Negative for chest pain, palpitations, orthopnea and leg swelling.  Gastrointestinal: Positive for nausea. Negative for abdominal pain, constipation, diarrhea and vomiting.  Genitourinary: Negative for dysuria and hematuria.  Musculoskeletal: Negative for back pain, falls and myalgias.  Skin: Negative for rash.  Neurological: Positive for weakness. Negative for dizziness, tremors, sensory change, speech change, focal weakness, seizures and headaches.  Endo/Heme/Allergies: Does not bruise/bleed easily.  Psychiatric/Behavioral: Negative for depression and memory loss. The patient is not nervous/anxious.     MEDICATIONS AT HOME:   Prior to Admission medications   Medication Sig Start Date End Date Taking? Authorizing Provider  atorvastatin (LIPITOR) 20 MG tablet Take 1 tablet (20 mg total) by mouth daily. Reported on 04/06/2015 04/06/15   Kathrine Haddock, NP  blood glucose meter kit and supplies KIT Dispense based on patient and insurance preference. Use up to four times daily as directed. (FOR ICD-9 250.00, 250.01). 10/02/15   Kathrine Haddock, NP  cyclobenzaprine (FLEXERIL) 10 MG tablet Take 1 tablet (10 mg total) by mouth at bedtime. 04/06/15   Kathrine Haddock, NP  dexamethasone (DECADRON) 2 MG tablet Take 1  tablet (2 mg total) by mouth 2 (two) times daily. 11/28/15   Noreene Filbert, MD  fluconazole (DIFLUCAN) 100 MG tablet Take 2 pills (200 mg) on day 1 then 1 pill (100 mg) a day for 10 days 12/04/15   Lequita Asal, MD  Insulin Glargine (LANTUS) 100 UNIT/ML Solostar Pen Inject 36 Units into the skin daily at 10 pm. 04/06/15   Kathrine Haddock, NP  lisinopril (PRINIVIL,ZESTRIL) 20 MG tablet Take 1 tablet (20 mg total) by mouth daily. 11/16/15   Theodoro Grist, MD   LORazepam (ATIVAN) 0.5 MG tablet Take 1 tablet (0.5 mg total) by mouth every 6 (six) hours as needed (Nausea or vomiting). 10/22/15   Lequita Asal, MD  magic mouthwash SOLN Take 5 mLs by mouth 4 (four) times daily as needed for mouth pain. 12/03/15   Lequita Asal, MD  meloxicam (MOBIC) 15 MG tablet Take 1 tablet (15 mg total) by mouth daily. 05/07/15   Charline Bills Cuthriell, PA-C  menthol-cetylpyridinium (CEPACOL) 3 MG lozenge Take 1 lozenge (3 mg total) by mouth as needed for sore throat. 11/16/15   Theodoro Grist, MD  metFORMIN (GLUCOPHAGE) 1000 MG tablet Take 1 tablet (1,000 mg total) by mouth 2 (two) times daily with a meal. 04/06/15   Kathrine Haddock, NP  ondansetron (ZOFRAN) 8 MG tablet Take 1 tablet (8 mg total) by mouth 2 (two) times daily as needed. Start on the third day after chemotherapy. 10/22/15   Lequita Asal, MD  oxyCODONE-acetaminophen (PERCOCET/ROXICET) 5-325 MG tablet Take 1 tablet by mouth every 6 (six) hours as needed for severe pain. 12/05/15   Cammie Sickle, MD  potassium chloride (K-DUR) 10 MEQ tablet Take 1 tablet (10 mEq total) by mouth 2 (two) times daily. 11/30/15   Lequita Asal, MD  prochlorperazine (COMPAZINE) 10 MG tablet Take 1 tablet (10 mg total) by mouth every 6 (six) hours as needed (Nausea or vomiting). 11/20/15   Lequita Asal, MD  sildenafil (REVATIO) 20 MG tablet 1-3 Tablet daily as needed ORAL 10/12/15   Volney American, PA-C  sildenafil (VIAGRA) 50 MG tablet Take 50 mg by mouth daily as needed for erectile dysfunction.    Historical Provider, MD  sucralfate (CARAFATE) 1 g tablet Take 1 tablet (1 g total) by mouth 3 (three) times daily. Dissolve in 2 to 3 tbs warm water, swish &swallow 11/01/15   Noreene Filbert, MD  UNIFINE PENTIPS 31G X 6 MM MISC 4 (four) times daily. 10/01/15   Historical Provider, MD     VITAL SIGNS:  Blood pressure 135/89, pulse 80, temperature 98.7 F (37.1 C), temperature source Oral, resp. rate 16, height 5'  7" (1.702 m), weight 82.3 kg (181 lb 6.4 oz), SpO2 100 %.  PHYSICAL EXAMINATION:  Physical Exam  GENERAL:  60 y.o.-year-old patient lying in the bed with no acute distress.  EYES: Pupils equal, round, reactive to light and accommodation. No scleral icterus. Extraocular muscles intact.  HEENT: Head atraumatic, normocephalic. Oropharynx and nasopharynx clear. No oropharyngeal erythema, dry oral mucosa  NECK:  Supple, no jugular venous distention. No thyroid enlargement, no tenderness.  LUNGS: Normal breath sounds bilaterally, no wheezing, rales, rhonchi. No use of accessory muscles of respiration.  CARDIOVASCULAR: S1, S2 normal. No murmurs, rubs, or gallops.  ABDOMEN: Soft, nontender, nondistended. Bowel sounds present. No organomegaly or mass.  EXTREMITIES: No pedal edema, cyanosis, or clubbing. + 2 pedal & radial pulses b/l.   NEUROLOGIC: Cranial nerves II through XII are intact.  No focal Motor or sensory deficits appreciated b/l PSYCHIATRIC: The patient is alert and oriented x 3. Good affect.  SKIN: No obvious rash, lesion, or ulcer.   LABORATORY PANEL:   CBC  Recent Labs Lab 12/04/15 0812  WBC 4.6  HGB 12.0*  HCT 32.5*  PLT 435   ------------------------------------------------------------------------------------------------------------------  Chemistries   Recent Labs Lab 12/04/15 0812 12/07/15 0847  NA 132* 126*  K 3.6 3.7  CL 99* 90*  CO2 24 27  GLUCOSE 112* 175*  BUN 11 32*  CREATININE 0.84 1.32*  CALCIUM 8.8* 9.0  MG 1.7 1.6*  AST 18  --   ALT 21  --   ALKPHOS 75  --   BILITOT 0.8  --    ------------------------------------------------------------------------------------------------------------------  Cardiac Enzymes No results for input(s): TROPONINI in the last 168 hours. ------------------------------------------------------------------------------------------------------------------  RADIOLOGY:  No results found.   IMPRESSION AND PLAN:   *  Hypovolemic hyponatremia due to low solute load from malnutrition Poor oral intake. Start IV fluids. Add ensure. Check urine sodium, urine creatinine and urine osmolality. Repeat labs in the morning.  * Acute kidney injury with dehydration Due to dehydration. Start IV fluids. Encouraged oral fluid intake. Monitor input and output. Repeat labs in the morning.  * Insulin dependent diabetes mellitus Continue Lantus. Sliding scale insulin.  * Hypertension Continue home medications.  * Hypomagnesemia Due to decreased oral intake. Replace through IV.  * Head and neck cancer Patient is undergoing chemotherapy and radiation therapy. Outpatient follow-up at cancer center.  All the records are reviewed and case discussed with ED provider. Management plans discussed with the patient, family and they are in agreement.  [name] STATUS: FULL [name]  TOTAL TIME TAKING CARE OF THIS PATIENT: 40 minutes.   Hillary Bow R M.D on 12/07/2015 at 5:15 PM  Between 7am to 6pm - Pager - [phone]  After 6pm go to [email or web address] - password [email or web address] [facility]  Office  [phone]  CC: Primary care physician; Kathrine Haddock, NP  Note: This dictation was prepared with Dragon dictation along with smaller phrase technology. Any transcriptional errors that result from this process are unintentional.

## 2015-12-08 LAB — POCT CBG MONITORING: POCT GLUCOSE (MANUAL ENTRY) KUC: 112 mg/dL — AB (ref 70–99)

## 2015-12-08 LAB — BASIC METABOLIC PANEL
Anion gap: 7 (ref 5–15)
BUN: 24 mg/dL — ABNORMAL HIGH (ref 6–20)
CALCIUM: 8.1 mg/dL — AB (ref 8.9–10.3)
CO2: 30 mmol/L (ref 22–32)
CREATININE: 1.01 mg/dL (ref 0.61–1.24)
Chloride: 95 mmol/L — ABNORMAL LOW (ref 101–111)
GFR calc non Af Amer: >60 mL/min (ref >60)
Glucose, Bld: 77 mg/dL (ref 65–99)
Potassium: 3 mmol/L — ABNORMAL LOW (ref 3.5–5.1)
SODIUM: 132 mmol/L — AB (ref 135–145)

## 2015-12-08 LAB — CBC
HCT: 32.4 % — ABNORMAL LOW (ref 40.0–52.0)
Hemoglobin: 11.8 g/dL — ABNORMAL LOW (ref 13.0–18.0)
MCH: 30.3 pg (ref 26.0–34.0)
MCHC: 36.4 g/dL — ABNORMAL HIGH (ref 32.0–36.0)
MCV: 83.3 fL (ref 80.0–100.0)
PLATELETS: 315 10*3/uL (ref 150–440)
RBC: 3.89 MIL/uL — AB (ref 4.40–5.90)
RDW: 16.1 % — AB (ref 11.5–14.5)
WBC: 10.1 10*3/uL (ref 3.8–10.6)

## 2015-12-08 LAB — MAGNESIUM: Magnesium: 1.9 mg/dL (ref 1.7–2.4)

## 2015-12-08 MED ORDER — POTASSIUM CHLORIDE CRYS ER 20 MEQ PO TBCR
40.0000 meq | EXTENDED_RELEASE_TABLET | ORAL | Status: AC
  Administered 2015-12-08 (×2): 40 meq via ORAL
  Filled 2015-12-08 (×2): qty 2

## 2015-12-08 NOTE — Progress Notes (Signed)
Received Md order to discharge patient patient to home, gave back to patient the meds he had stored in pharmacy/patient verified receipt of meds.  Reviewed home medications and discharge instructions to patient and patient verified understanding, patient declined wheel chair transportation opting to walk out of hospital to his vehicle.

## 2015-12-08 NOTE — Plan of Care (Signed)
Problem: Education: Goal: Knowledge of [facility] Education information/materials will improve Outcome: Progressing VS WDL, denies pain, nausea.  Reports discomfort swallowing, but not requiring intervention.  Bed in low position, call bell within reach.  WCTM.

## 2015-12-09 ENCOUNTER — Inpatient Hospital Stay
Admission: EM | Admit: 2015-12-09 | Discharge: 2015-12-12 | DRG: 643 | Disposition: A | Discharge status: Home / Self Care | Payer: Managed Care, Other (non HMO) | Attending: Internal Medicine | Admitting: Internal Medicine

## 2015-12-09 ENCOUNTER — Other Ambulatory Visit: Payer: Self-pay

## 2015-12-09 ENCOUNTER — Encounter: Payer: Self-pay | Admitting: Emergency Medicine

## 2015-12-09 DIAGNOSIS — E669 Obesity, unspecified: Secondary | ICD-10-CM | POA: Diagnosis present

## 2015-12-09 DIAGNOSIS — Z79899 Other long term (current) drug therapy: Secondary | ICD-10-CM

## 2015-12-09 DIAGNOSIS — M1990 Unspecified osteoarthritis, unspecified site: Secondary | ICD-10-CM | POA: Diagnosis present

## 2015-12-09 DIAGNOSIS — R05 Cough: Secondary | ICD-10-CM

## 2015-12-09 DIAGNOSIS — C01 Malignant neoplasm of base of tongue: Secondary | ICD-10-CM | POA: Diagnosis present

## 2015-12-09 DIAGNOSIS — Z6827 Body mass index (BMI) 27.0-27.9, adult: Secondary | ICD-10-CM

## 2015-12-09 DIAGNOSIS — M6281 Muscle weakness (generalized): Secondary | ICD-10-CM

## 2015-12-09 DIAGNOSIS — Z79891 Long term (current) use of opiate analgesic: Secondary | ICD-10-CM

## 2015-12-09 DIAGNOSIS — Z7901 Long term (current) use of anticoagulants: Secondary | ICD-10-CM

## 2015-12-09 DIAGNOSIS — E1165 Type 2 diabetes mellitus with hyperglycemia: Secondary | ICD-10-CM | POA: Diagnosis present

## 2015-12-09 DIAGNOSIS — R1311 Dysphagia, oral phase: Secondary | ICD-10-CM | POA: Diagnosis present

## 2015-12-09 DIAGNOSIS — Z809 Family history of malignant neoplasm, unspecified: Secondary | ICD-10-CM

## 2015-12-09 DIAGNOSIS — N189 Chronic kidney disease, unspecified: Secondary | ICD-10-CM | POA: Diagnosis present

## 2015-12-09 DIAGNOSIS — T508X5A Adverse effect of diagnostic agents, initial encounter: Secondary | ICD-10-CM | POA: Diagnosis present

## 2015-12-09 DIAGNOSIS — Z7984 Long term (current) use of oral hypoglycemic drugs: Secondary | ICD-10-CM

## 2015-12-09 DIAGNOSIS — I10 Essential (primary) hypertension: Secondary | ICD-10-CM | POA: Diagnosis present

## 2015-12-09 DIAGNOSIS — E222 Syndrome of inappropriate secretion of antidiuretic hormone: Secondary | ICD-10-CM | POA: Diagnosis not present

## 2015-12-09 DIAGNOSIS — I129 Hypertensive chronic kidney disease with stage 1 through stage 4 chronic kidney disease, or unspecified chronic kidney disease: Secondary | ICD-10-CM | POA: Diagnosis present

## 2015-12-09 DIAGNOSIS — E860 Dehydration: Secondary | ICD-10-CM | POA: Diagnosis present

## 2015-12-09 DIAGNOSIS — E119 Type 2 diabetes mellitus without complications: Secondary | ICD-10-CM | POA: Diagnosis present

## 2015-12-09 DIAGNOSIS — Z8249 Family history of ischemic heart disease and other diseases of the circulatory system: Secondary | ICD-10-CM

## 2015-12-09 DIAGNOSIS — E876 Hypokalemia: Secondary | ICD-10-CM | POA: Diagnosis present

## 2015-12-09 DIAGNOSIS — R112 Nausea with vomiting, unspecified: Secondary | ICD-10-CM | POA: Diagnosis not present

## 2015-12-09 DIAGNOSIS — Z794 Long term (current) use of insulin: Secondary | ICD-10-CM

## 2015-12-09 DIAGNOSIS — Z95828 Presence of other vascular implants and grafts: Secondary | ICD-10-CM

## 2015-12-09 DIAGNOSIS — K121 Other forms of stomatitis: Secondary | ICD-10-CM | POA: Diagnosis present

## 2015-12-09 DIAGNOSIS — E43 Unspecified severe protein-calorie malnutrition: Secondary | ICD-10-CM | POA: Insufficient documentation

## 2015-12-09 DIAGNOSIS — Z85028 Personal history of other malignant neoplasm of stomach: Secondary | ICD-10-CM

## 2015-12-09 DIAGNOSIS — E1122 Type 2 diabetes mellitus with diabetic chronic kidney disease: Secondary | ICD-10-CM | POA: Diagnosis present

## 2015-12-09 DIAGNOSIS — C089 Malignant neoplasm of major salivary gland, unspecified: Secondary | ICD-10-CM | POA: Diagnosis present

## 2015-12-09 DIAGNOSIS — R059 Cough, unspecified: Secondary | ICD-10-CM

## 2015-12-09 DIAGNOSIS — E785 Hyperlipidemia, unspecified: Secondary | ICD-10-CM | POA: Diagnosis present

## 2015-12-09 DIAGNOSIS — Z9221 Personal history of antineoplastic chemotherapy: Secondary | ICD-10-CM

## 2015-12-09 DIAGNOSIS — E878 Other disorders of electrolyte and fluid balance, not elsewhere classified: Secondary | ICD-10-CM | POA: Diagnosis present

## 2015-12-09 LAB — HEMOGLOBIN A1C
HEMOGLOBIN A1C: 8.1 % — AB (ref 4.8–5.6)
Mean Plasma Glucose: 186 mg/dL

## 2015-12-09 LAB — CBC WITH DIFFERENTIAL/PLATELET
BASOS ABS: 0 10*3/uL (ref 0–0.1)
BASOS PCT: 0 %
EOS ABS: 0 10*3/uL (ref 0–0.7)
Eosinophils Relative: 0 %
HEMATOCRIT: 34.7 % — AB (ref 40.0–52.0)
HEMOGLOBIN: 12.8 g/dL — AB (ref 13.0–18.0)
Lymphocytes Relative: 3 %
Lymphs Abs: 0.4 10*3/uL — ABNORMAL LOW (ref 1.0–3.6)
MCH: 30.1 pg (ref 26.0–34.0)
MCHC: 36.8 g/dL — ABNORMAL HIGH (ref 32.0–36.0)
MCV: 81.8 fL (ref 80.0–100.0)
Monocytes Absolute: 0.9 10*3/uL (ref 0.2–1.0)
Monocytes Relative: 7 %
NEUTROS ABS: 13.1 10*3/uL — AB (ref 1.4–6.5)
NEUTROS PCT: 90 %
Platelets: 262 10*3/uL (ref 150–440)
RBC: 4.25 MIL/uL — AB (ref 4.40–5.90)
RDW: 15.6 % — ABNORMAL HIGH (ref 11.5–14.5)
WBC: 14.6 10*3/uL — AB (ref 3.8–10.6)

## 2015-12-09 LAB — COMPREHENSIVE METABOLIC PANEL
ALBUMIN: 3.6 g/dL (ref 3.5–5.0)
ALK PHOS: 80 U/L (ref 38–126)
ALT: 47 U/L (ref 17–63)
ANION GAP: 9 (ref 5–15)
AST: 24 U/L (ref 15–41)
BILIRUBIN TOTAL: 1.1 mg/dL (ref 0.3–1.2)
BUN: 18 mg/dL (ref 6–20)
CALCIUM: 8.3 mg/dL — AB (ref 8.9–10.3)
CO2: 27 mmol/L (ref 22–32)
Chloride: 91 mmol/L — ABNORMAL LOW (ref 101–111)
Creatinine, Ser: 0.82 mg/dL (ref 0.61–1.24)
GFR calc non Af Amer: >60 mL/min (ref >60)
GLUCOSE: 146 mg/dL — AB (ref 65–99)
POTASSIUM: 3.2 mmol/L — AB (ref 3.5–5.1)
SODIUM: 127 mmol/L — AB (ref 135–145)
TOTAL PROTEIN: 6.5 g/dL (ref 6.5–8.1)

## 2015-12-09 LAB — TROPONIN I: Troponin I: <0.03 ng/mL (ref <0.03)

## 2015-12-09 MED ORDER — SODIUM CHLORIDE 0.9 % IV BOLUS (SEPSIS)
1000.0000 mL | Freq: Once | INTRAVENOUS | Status: AC
  Administered 2015-12-09: 1000 mL via INTRAVENOUS

## 2015-12-09 MED ORDER — MORPHINE SULFATE (PF) 2 MG/ML IV SOLN
2.0000 mg | Freq: Once | INTRAVENOUS | Status: AC
  Administered 2015-12-09: 2 mg via INTRAVENOUS
  Filled 2015-12-09: qty 1

## 2015-12-09 MED ORDER — ONDANSETRON HCL 4 MG/2ML IJ SOLN
4.0000 mg | Freq: Once | INTRAMUSCULAR | Status: AC
  Administered 2015-12-09: 4 mg via INTRAVENOUS
  Filled 2015-12-09: qty 2

## 2015-12-09 MED ORDER — SODIUM CHLORIDE 0.9 % IV BOLUS (SEPSIS): 1000.0000 mL | Freq: Once | INTRAVENOUS | Status: DC

## 2015-12-09 NOTE — ED Provider Notes (Signed)
[facility] Emergency Department Provider Note ____________________________________________   I have reviewed the triage vital signs and the triage nursing note.  HISTORY  Chief Complaint Emesis   Historian Patient  HPI Izayah Miner Rolin is a 60 y.o. 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 He was recently admitted to the hospital at home and over the last 24 hours he has not ate or drank anything at all. He is spitting up mucus which he states is coming for nausea and vomiting but also from mucous secretions that he is spitting up. He is having moderate pain at his throat. Denies fever. Denies shortness breath. Denies chest pain.  Oncologist Dr. Mike Gip recommended he come to the ED for evaluation and admission for poor intake, dehydration, and intractable nausea and vomiting.    Past Medical History:  Diagnosis Date  . Arthritis   . Diabetes mellitus without complication ([place])   . Hyperlipidemia   . Hypertension   . Obesity   . Stomach cancer ([place])    had tumor removed  . Tongue cancer [facility])     Patient Active Problem List   Diagnosis Date Noted  . Dehydration 11/16/2015  . Odynophagia 11/16/2015  . Anemia 11/16/2015  . Thrombocytopenia ([place]) 11/16/2015  . Hyponatremia 11/12/2015  . Hypomagnesemia 11/12/2015  . Cancer associated pain 10/22/2015  . 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 Low back pain 04/06/2015  . Hypertension 12/05/2014  . Poorly controlled type 2 diabetes mellitus ([place]) 12/05/2014  . Hypertensive CKD (chronic kidney disease) 12/05/2014  . Hyperlipidemia 12/05/2014  . Class II obesity 12/05/2014  . Erectile dysfunction 12/05/2014  . Nonalcoholic steatohepatitis 95/11/3265    Past Surgical History:  Procedure Laterality Date   . gastric tumor removed  2010    Prior to Admission medications   Medication Sig Start Date End Date Taking? Authorizing Provider  atorvastatin (LIPITOR) 20 MG tablet Take 1 tablet (20 mg total) by mouth daily. Reported on 04/06/2015 04/06/15   Kathrine Haddock, NP  blood glucose meter kit and supplies KIT Dispense based on patient and insurance preference. Use up to four times daily as directed. (FOR ICD-9 250.00, 250.01). 10/02/15   Kathrine Haddock, NP  cyclobenzaprine (FLEXERIL) 10 MG tablet Take 1 tablet (10 mg total) by mouth at bedtime. 04/06/15   Kathrine Haddock, NP  dexamethasone (DECADRON) 2 MG tablet Take 1 tablet (2 mg total) by mouth 2 (two) times daily. 11/28/15   Noreene Filbert, MD  Insulin Glargine (LANTUS) 100 UNIT/ML Solostar Pen Inject 36 Units into the skin daily at 10 pm. 04/06/15   Kathrine Haddock, NP  lisinopril (PRINIVIL,ZESTRIL) 20 MG tablet Take 1 tablet (20 mg total) by mouth daily. 11/16/15   Theodoro Grist, MD  LORazepam (ATIVAN) 0.5 MG tablet Take 1 tablet (0.5 mg total) by mouth every 6 (six) hours as needed (Nausea or vomiting). 10/22/15   Lequita Asal, MD  magic mouthwash SOLN Take 5 mLs by mouth 4 (four) times daily as needed for mouth pain. 12/03/15   Lequita Asal, MD  meloxicam (MOBIC) 15 MG tablet Take 1 tablet (15 mg total) by mouth daily. 05/07/15   Charline Bills Cuthriell, PA-C  menthol-cetylpyridinium (CEPACOL) 3 MG lozenge Take 1 lozenge (3 mg total) by mouth as needed for sore throat.  11/16/15   Theodoro Grist, MD  metFORMIN (GLUCOPHAGE) 1000 MG tablet Take 1 tablet (1,000 mg total) by mouth 2 (two) times daily with a meal. 04/06/15   Kathrine Haddock, NP  ondansetron (ZOFRAN) 8 MG tablet Take 1 tablet (8 mg total) by mouth 2 (two) times daily as needed. Start on the third day after chemotherapy. 10/22/15   Lequita Asal, MD  oxyCODONE-acetaminophen (PERCOCET/ROXICET) 5-325 MG tablet Take 1 tablet by mouth every 6 (six) hours as needed for severe pain. 12/05/15   Cammie Sickle, MD  potassium chloride (K-DUR) 10 MEQ tablet Take 1 tablet (10 mEq total) by mouth 2 (two) times daily. 11/30/15   Lequita Asal, MD  prochlorperazine (COMPAZINE) 10 MG tablet Take 1 tablet (10 mg total) by mouth every 6 (six) hours as needed (Nausea or vomiting). 11/20/15   Lequita Asal, MD  sildenafil (VIAGRA) 50 MG tablet Take 50 mg by mouth daily as needed for erectile dysfunction.    Historical Provider, MD  sucralfate (CARAFATE) 1 g tablet Take 1 tablet (1 g total) by mouth 3 (three) times daily. Dissolve in 2 to 3 tbs warm water, swish &swallow 11/01/15   Noreene Filbert, MD  UNIFINE PENTIPS 31G X 6 MM MISC 4 (four) times daily. 10/01/15   Historical Provider, MD    No Known Allergies  Family History  Problem Relation Age of Onset  . Heart disease Mother   . Hypertension Father   . Cancer Sister     stomach  . Heart disease Brother     MI  . Cancer Daughter   . Cancer Son     testicular  . ADD / ADHD Sister     Social History Social History  Substance Use Topics  . Smoking status: Never Smoker  . Smokeless tobacco: Never Used  . Alcohol use No    Review of Systems  Constitutional: Negative for fever. Eyes: Negative for visual changes. ENT: Positive for sore throat. Cardiovascular: Negative for chest pain. Respiratory: Negative for shortness of breath. Gastrointestinal: Negative for diarrhea. Genitourinary: Negative for dysuria. Musculoskeletal: Negative for back pain. Skin: Negative for rash. Neurological: Negative for headache. 10 point Review of Systems otherwise negative ____________________________________________   PHYSICAL EXAM:  VITAL SIGNS: ED Triage Vitals [12/09/15 2231]  Enc Vitals Group     BP (!) 140/94     Pulse Rate (!) 126     Resp 18     Temp      Temp src      SpO2 94 %     Weight 186 lb (84.4 kg)     Height 5' 7"  (1.702 m)     Head Circumference      Peak Flow      Pain Score      Pain Loc      Pain Edu?       Excl. in [place]?      Constitutional: Alert and oriented. Looks like he doesn't feel well, and is constantly spitting up mucus and his emesis bag. HEENT   Head: Normocephalic and atraumatic.      Eyes: Conjunctivae are normal. PERRL. Normal extraocular movements.      Ears:         Nose: No congestion/rhinnorhea.   Mouth/Throat: Mucous membranes are Mildly dry.   Neck: No stridor. External skin is reddened dark discolored. Cardiovascular/Chest: Normal rate, regular rhythm.  No murmurs, rubs, or gallops. Respiratory: Normal respiratory effort without tachypnea nor retractions. Breath  sounds are clear and equal bilaterally. No wheezes/rales/rhonchi. Gastrointestinal: Soft. No distention, no guarding, no rebound. Nontender.    Genitourinary/rectal:Deferred Musculoskeletal: Nontender with normal range of motion in all extremities. No joint effusions.  No lower extremity tenderness.  No edema. Neurologic:  Normal speech and language. No gross or focal neurologic deficits are appreciated. Skin:  Skin is warm, dry and intact. No rash noted. Psychiatric: Mood and affect are normal. Speech and behavior are normal. Patient exhibits appropriate insight and judgment.   ____________________________________________  LABS (pertinent positives/negatives)  Labs Reviewed  COMPREHENSIVE METABOLIC PANEL  TROPONIN I  CBC WITH DIFFERENTIAL/PLATELET  URINALYSIS COMPLETEWITH MICROSCOPIC ([place])    ____________________________________________    EKG I, Lisa Roca, MD, the attending physician have personally viewed and interpreted all ECGs.  Pending ____________________________________________  RADIOLOGY All Xrays were viewed by me. Imaging interpreted by Radiologist.  None __________________________________________  PROCEDURES  Procedure(s) performed: None  Critical Care performed: None  ____________________________________________   ED COURSE / ASSESSMENT AND  PLAN  Pertinent labs & imaging results that were available during my care of the patient were reviewed by me and considered in my medical decision making (see chart for details).  Upon review of recent hospitalization, at that point he was found have acute hyponatremia.  Patient was given IV hydration as well as pain and nausea medications. Ultimately he is really not able to take anything down. Oncologist has recommended patient be admitted overnight, likely to receive PICC lines of aching receive fluids at home as this is likely to be an ongoing issue given he still has 54 radiation treatments to complete his treatment cycle.  I spoke with hospitalist for admission, once labs are back. Patient care transferred to Dr. Dineen Kid at 11 PM, shift change, labs and ecg are pending.  CONSULTATIONS:  Hospitalist for admission  Patient / Family / Caregiver informed of clinical course, medical decision-making process, and agree with plan.    ___________________________________________   FINAL CLINICAL IMPRESSION(S) / ED DIAGNOSES   Final diagnoses:  Intractable vomiting with nausea, unspecified vomiting type              Note: This dictation was prepared with Dragon dictation. Any transcriptional errors that result from this process are unintentional    Lisa Roca, MD 12/09/15 2258

## 2015-12-09 NOTE — ED Triage Notes (Signed)
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 Patient last chemo treatment was on Tuesday and last radiation was on Friday. Patient has been vomiting since this morning.

## 2015-12-09 NOTE — Discharge Summary (Signed)
[place] [facility] [place] NAME: Kent [name]    MR#:  [id number]  DATE OF BIRTH:  1956/01/15  DATE OF ADMISSION:  12/07/2015   ADMITTING PHYSICIAN: Bettey Costa, MD  DATE OF DISCHARGE: 12/08/2015  2:10 PM  PRIMARY CARE PHYSICIAN: Kathrine Haddock, NP   ADMISSION DIAGNOSIS:   tonsilar ca renal insufficiency hyponatremia  DISCHARGE DIAGNOSIS:   Active Problems:   Hyponatremia   SECONDARY DIAGNOSIS:   Past Medical History:  Diagnosis Date  . Arthritis   . Diabetes mellitus without complication ([place])   . Hyperlipidemia   . Hypertension   . Obesity   . Stomach cancer ([place])    had tumor removed  . Tongue cancer [facility])     HOSPITAL COURSE:   60 year old male with past medical history significant for head and neck cancer status post chemoradiation, hypertension and diabetes presents to the hospital secondary to weakness and dizziness and noted to have hyponatremia.  #1 hyponatremia-hypovolemic hyponatremia. From decreased oral intake. -IV fluids were given. 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 Encouraged oral intake.  #2 hypokalemia-replaced prior to discharge. Patient also on potassium supplements at discharge.  #3 acute renal failure-prerenal from dehydration. Improved with IV fluids.  #4 diabetes mellitus-continue Lantus. Patient also on metformin  #5 head and neck cancer-following up with Dr. Mike Gip. Finished chemotherapy. Still has few more radiation treatments to go. -Mucosal ulcerations from the radiation. Much improved pain and able to eat better - has decadron orally. - mgmt per oncology  Stable for discharge home today  DISCHARGE CONDITIONS:   Stable  CONSULTS OBTAINED:   Treatment Team:  Gladstone Lighter, MD  DRUG ALLERGIES:   No Known Allergies DISCHARGE MEDICATIONS:     Medication List    STOP taking these medications   fluconazole  100 MG tablet Commonly known as:  DIFLUCAN   sildenafil 20 MG tablet Commonly known as:  REVATIO     TAKE these medications   atorvastatin 20 MG tablet Commonly known as:  LIPITOR Take 1 tablet (20 mg total) by mouth daily. Reported on 04/06/2015   blood glucose meter kit and supplies Kit Dispense based on patient and insurance preference. Use up to four times daily as directed. (FOR ICD-9 250.00, 250.01).   cyclobenzaprine 10 MG tablet Commonly known as:  FLEXERIL Take 1 tablet (10 mg total) by mouth at bedtime.   dexamethasone 2 MG tablet Commonly known as:  DECADRON Take 1 tablet (2 mg total) by mouth 2 (two) times daily.   Insulin Glargine 100 UNIT/ML Solostar Pen Commonly known as:  LANTUS Inject 36 Units into the skin daily at 10 pm.   lisinopril 20 MG tablet Commonly known as:  PRINIVIL,ZESTRIL Take 1 tablet (20 mg total) by mouth daily.   LORazepam 0.5 MG tablet Commonly known as:  ATIVAN Take 1 tablet (0.5 mg total) by mouth every 6 (six) hours as needed (Nausea or vomiting).   magic mouthwash Soln Take 5 mLs by mouth 4 (four) times daily as needed for mouth pain.   meloxicam 15 MG tablet Commonly known as:  MOBIC Take 1 tablet (15 mg total) by mouth daily.   menthol-cetylpyridinium 3 MG lozenge Commonly known as:  CEPACOL Take 1 lozenge (3 mg total) by mouth as needed for sore throat.   metFORMIN 1000 MG tablet Commonly known as:  GLUCOPHAGE Take 1 tablet (1,000 mg total) by mouth  2 (two) times daily with a meal.   ondansetron 8 MG tablet Commonly known as:  ZOFRAN Take 1 tablet (8 mg total) by mouth 2 (two) times daily as needed. Start on the third day after chemotherapy.   oxyCODONE-acetaminophen 5-325 MG tablet Commonly known as:  PERCOCET/ROXICET Take 1 tablet by mouth every 6 (six) hours as needed for severe pain.   potassium chloride 10 MEQ tablet Commonly known as:  K-DUR Take 1 tablet (10 mEq total) by mouth 2 (two) times daily.     prochlorperazine 10 MG tablet Commonly known as:  COMPAZINE Take 1 tablet (10 mg total) by mouth every 6 (six) hours as needed (Nausea or vomiting).   sildenafil 50 MG tablet Commonly known as:  VIAGRA Take 50 mg by mouth daily as needed for erectile dysfunction.   sucralfate 1 g tablet Commonly known as:  CARAFATE Take 1 tablet (1 g total) by mouth 3 (three) times daily. Dissolve in 2 to 3 tbs warm water, swish &swallow   UNIFINE PENTIPS 31G X 6 MM Misc Generic drug:  Insulin Pen Needle 4 (four) times daily.        DISCHARGE INSTRUCTIONS:   1. Oncology Follow-up as prior scheduled 2. PCP follow-up in 1 week  DIET:   Cardiac diet  ACTIVITY:   Activity as tolerated  OXYGEN:   Home Oxygen: No.  Oxygen Delivery: room air  DISCHARGE LOCATION:   home   If you experience worsening of your admission symptoms, develop shortness of breath, life threatening emergency, suicidal or homicidal thoughts you must seek medical attention immediately by calling 911 or calling your MD immediately  if symptoms less severe.  You Must read complete instructions/literature along with all the possible adverse reactions/side effects for all the Medicines you take and that have been prescribed to you. Take any new Medicines after you have completely understood and accpet all the possible adverse reactions/side effects.   Please note  You were cared for by a hospitalist during your hospital stay. If you have any questions about your discharge medications or the care you received while you were in the hospital after you are discharged, you can call the unit and asked to speak with the hospitalist on call if the hospitalist that took care of you is not available. Once you are discharged, your primary care physician will handle any further medical issues. Please note that NO REFILLS for any discharge medications will be authorized once you are discharged, as it is imperative that you return to  your primary care physician (or establish a relationship with a primary care physician if you do not have one) for your aftercare needs so that they can reassess your need for medications and monitor your lab values.    On the day of Discharge:  VITAL SIGNS:   Blood pressure 122/77, pulse 62, temperature 98.5 F (36.9 C), temperature source Oral, resp. rate 16, height 5' 7" (1.702 m), weight 84.8 kg (186 lb 14.4 oz), SpO2 100 %.  PHYSICAL EXAMINATION:    GENERAL:  60 y.o.-year-old patient lying in the bed with no acute distress.  EYES: Pupils equal, round, reactive to light and accommodation. No scleral icterus. Extraocular muscles intact.  HEENT: Head atraumatic, normocephalic. Oropharynx With some ulcerations on the soft palate noted and nasopharynx clear.  NECK:  Supple, no jugular venous distention. No thyroid enlargement, no tenderness. Discoloration/hyperpigmentation on the side of the neck from radiation LUNGS: Normal breath sounds bilaterally, no wheezing, rales,rhonchi or crepitation. No  use of accessory muscles of respiration. Decreased bibasilar breath sounds CARDIOVASCULAR: S1, S2 normal. No murmurs, rubs, or gallops.  ABDOMEN: Soft, non-tender, non-distended. Bowel sounds present. No organomegaly or mass.  EXTREMITIES: No pedal edema, cyanosis, or clubbing.  NEUROLOGIC: Cranial nerves II through XII are intact. Muscle strength 5/5 in all extremities. Sensation intact. Gait not checked.  PSYCHIATRIC: The patient is alert and oriented x 3.  SKIN: No obvious rash, lesion, or ulcer.   DATA REVIEW:   CBC  Recent Labs Lab 12/08/15 0452  WBC 10.1  HGB 11.8*  HCT 32.4*  PLT 315    Chemistries   Recent Labs Lab 12/04/15 0812  12/08/15 0452  NA 132*  < > 132*  K 3.6  < > 3.0*  CL 99*  < > 95*  CO2 24  < > 30  GLUCOSE 112*  < > 77  BUN 11  < > 24*  CREATININE 0.84  < > 1.01  CALCIUM 8.8*  < > 8.1*  MG 1.7  < > 1.9  AST 18  --   --   ALT 21  --   --   ALKPHOS  75  --   --   BILITOT 0.8  --   --   < > = values in this interval not displayed.   Microbiology Results  No results found for this or any previous visit.  RADIOLOGY:  No results found.   Management plans discussed with the patient, family and they are in agreement.  CODE STATUS:  Code Status History    Date Active Date Inactive Code Status Order ID Comments User Context   12/07/2015  5:13 PM 12/08/2015  5:39 PM Full Code [id number]  Hillary Bow, MD Inpatient   11/15/2015 12:19 PM 11/16/2015  8:00 PM Full Code [id number]  Dustin Flock, MD Inpatient      TOTAL TIME TAKING CARE OF THIS PATIENT: 38 minutes.    Vearl Allbaugh M.D on 12/09/2015 at 11:50 AM  Between 7am to 6pm - Pager - [phone]  After 6pm go to [email or web address] - Proofreader  [facility]  Office  [phone]  CC: Primary care physician; Kathrine Haddock, NP   Note: This dictation was prepared with Dragon dictation along with smaller phrase technology. Any transcriptional errors that result from this process are unintentional.

## 2015-12-09 NOTE — Progress Notes (Deleted)
[facility] day:  12/09/15  Chief Complaint: Kent [name] is a 60 y.o. 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  HPI:  The patient was last seen in the medical oncology clinic on 12/04/2015.  At that time, decision was made to proceed with cycle #3 cisplatin.  Sodium was 132.  BUN was 11 and creatinine 0.84.  He received a prescription for fluconazole for his thrush.  He was to continue his oral potassium.  We discussed good oral hydration and caloric intake.  He did not receive IVF on 10/04 and 12/06/2015.  Labs on 12/07/2015 revealed a sodium of 126, BUN 32 and creatinine 1.32.  Oral intake was minimal.  He received IVF and 2 gm of magnesium sulfate in clinic.  He was admitted overnight at [facility] for hydration and electrolyte replacement.Labs on 12/08/2015 revealed a sodium of 132, potassium 3.0, BUN 24 and creatinine 1.01.   Past Medical History:  Diagnosis Date  . Arthritis   . Diabetes mellitus without complication ([place])   . Hyperlipidemia   . Hypertension   . Obesity   . Stomach cancer ([place])    had tumor removed  . Tongue cancer [facility])     Past Surgical History:  Procedure Laterality Date  . gastric tumor removed  2010    Family History  Problem Relation Age of Onset  . Heart disease Mother   . Hypertension Father   . Cancer Sister     stomach  . Heart disease Brother     MI  . Cancer Daughter   . Cancer Son     testicular  . ADD / ADHD Sister     Social History:  reports that he has never smoked. He has never used smokeless tobacco. He reports that he does not drink alcohol or use drugs.  He smokes a pipe and cigar rarely (once every 3 months).  He has a son and daughter.  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  He has planned (and paid for) a trip to the beach from 10/15/2015 -  10/20/2015.  He was no short term disability.  He has 10 weeks of vacation and sick leave.  The patient is alone today.  Allergies: No Known Allergies  Current Medications: Current Outpatient Prescriptions  Medication Sig Dispense Refill  . atorvastatin (LIPITOR) 20 MG tablet Take 1 tablet (20 mg total) by mouth daily. Reported on 04/06/2015 90 tablet 1  . blood glucose meter kit and supplies KIT Dispense based on patient and insurance preference. Use up to four times daily as directed. (FOR ICD-9 250.00, 250.01). 1 each 12  . cyclobenzaprine (FLEXERIL) 10 MG tablet Take 1 tablet (10 mg total) by mouth at bedtime. 30 tablet 2  . dexamethasone (DECADRON) 2 MG tablet Take 1 tablet (2 mg total) by mouth 2 (two) times daily. 40 tablet 0  . Insulin Glargine (LANTUS) 100 UNIT/ML Solostar Pen Inject 36 Units into the skin daily at 10 pm. 4 pen 3  . lisinopril (PRINIVIL,ZESTRIL) 20 MG tablet Take 1 tablet (20 mg total) by mouth daily. 30 tablet 5  . LORazepam (ATIVAN) 0.5 MG tablet Take 1 tablet (0.5 mg total) by mouth every 6 (six) hours as needed (Nausea or vomiting). 30 tablet 0  . magic mouthwash SOLN Take 5 mLs  by mouth 4 (four) times daily as needed for mouth pain. 480 mL 1  . meloxicam (MOBIC) 15 MG tablet Take 1 tablet (15 mg total) by mouth daily. 30 tablet 0  . menthol-cetylpyridinium (CEPACOL) 3 MG lozenge Take 1 lozenge (3 mg total) by mouth as needed for sore throat. 100 tablet 12  . metFORMIN (GLUCOPHAGE) 1000 MG tablet Take 1 tablet (1,000 mg total) by mouth 2 (two) times daily with a meal. 180 tablet 3  . ondansetron (ZOFRAN) 8 MG tablet Take 1 tablet (8 mg total) by mouth 2 (two) times daily as needed. Start on the third day after chemotherapy. 30 tablet 1  . oxyCODONE-acetaminophen (PERCOCET/ROXICET) 5-325 MG tablet Take 1 tablet by mouth every 6 (six) hours as needed for severe pain. 40 tablet 0  . potassium chloride (K-DUR) 10 MEQ tablet Take 1 tablet (10 mEq total) by mouth 2 (two)  times daily. 20 tablet 0  . prochlorperazine (COMPAZINE) 10 MG tablet Take 1 tablet (10 mg total) by mouth every 6 (six) hours as needed (Nausea or vomiting). 30 tablet 1  . sildenafil (VIAGRA) 50 MG tablet Take 50 mg by mouth daily as needed for erectile dysfunction.    . sucralfate (CARAFATE) 1 g tablet Take 1 tablet (1 g total) by mouth 3 (three) times daily. Dissolve in 2 to 3 tbs warm water, swish &swallow 90 tablet 3  . UNIFINE PENTIPS 31G X 6 MM MISC 4 (four) times daily.     No current facility-administered medications for this visit.     Review of Systems:  GENERAL:  Feels "pretty good".  No fevers or sweats.  Weight up 1 pound. PERFORMANCE STATUS (ECOG):  1 HEENT:  Vision little blurry secondary to diabetes.  Sore throat.  No runny nose or mouth sores. Lungs: No shortness of breath or cough.  No hemoptysis. Cardiac:  No chest pain, palpitations, orthopnea, or PND.  GI:  Appetite little better.  No nausea, vomiting, diarrhea, constipation, melena or hematochezia.  No prior colonoscopy. GU:  No urgency, frequency, dysuria, or hematuria.  Musculoskeletal:  No back pain.  Knee aches.  No muscle tenderness. Extremities:  No pain or swelling. Skin:  No rashes or skin changes. Neuro:  No headache, numbness or weakness, balance or coordination issues. Endocrine:  Diabetes.  No thyroid issues, hot flashes or night sweats. Psych:  No mood changes, depression or anxiety. Pain:  Throat pain. Review of systems:  All other systems reviewed and found to be negative.  Physical Exam: There were no vitals taken for this visit. GENERAL:  Slightly fatigued appearing (improved) gentleman sitting comfortably in the exam room in no acute distress. MENTAL STATUS:  Alert and oriented to person, place and time. HEAD:  Short gray hair.  Goatee.  Normocephalic, atraumatic, face symmetric, no Cushingoid features. EYES:  Blue eyes.  Pupils equal round and reactive to light and accomodation.  No  conjunctivitis or scleral icterus. ENT:  Oropharynx with posterior pharynx thrush.  Tongue normal.  Mucous membranes moist.  RESPIRATORY:  Clear to auscultation without rales, wheezes or rhonchi. CARDIOVASCULAR:  Regular rate and rhythm without murmur, rub or gallop. ABDOMEN:  Soft, non-tender, with active bowel sounds, and no hepatosplenomegaly.  No masses. SKIN:  No rashes, ulcers or lesions. EXTREMITIES: No edema, no skin discoloration or tenderness.  No palpable cords. LYMPH NODES: Conglomerate soft left neck adenopathy.  No palpable supraclavicular, axillary or inguinal adenopathy  NEUROLOGICAL: Unremarkable. PSYCH:  Appropriate.  Affect brighter.   Admission  on 12/07/2015, Discharged on 12/08/2015  Component Date Value Ref Range Status  . WBC 12/07/2015 11.5* 3.8 - 10.6 K/uL Final  . RBC 12/07/2015 3.87* 4.40 - 5.90 MIL/uL Final  . Hemoglobin 12/07/2015 11.5* 13.0 - 18.0 g/dL Final  . HCT 12/07/2015 31.9* 40.0 - 52.0 % Final  . MCV 12/07/2015 82.5  80.0 - 100.0 fL Final  . MCH 12/07/2015 29.6  26.0 - 34.0 pg Final  . MCHC 12/07/2015 35.9  32.0 - 36.0 g/dL Final  . RDW 12/07/2015 16.5* 11.5 - 14.5 % Final  . Platelets 12/07/2015 318  150 - 440 K/uL Final  . Sodium 12/08/2015 132* 135 - 145 mmol/L Final  . Potassium 12/08/2015 3.0* 3.5 - 5.1 mmol/L Final  . Chloride 12/08/2015 95* 101 - 111 mmol/L Final  . CO2 12/08/2015 30  22 - 32 mmol/L Final  . Glucose, Bld 12/08/2015 77  65 - 99 mg/dL Final  . BUN 12/08/2015 24* 6 - 20 mg/dL Final  . Creatinine, Ser 12/08/2015 1.01  0.61 - 1.24 mg/dL Final  . Calcium 12/08/2015 8.1* 8.9 - 10.3 mg/dL Final  . GFR calc non Af Amer 12/08/2015 >60  >60 mL/min Final  . GFR calc Af Amer 12/08/2015 >60  >60 mL/min Final   Comment: (NOTE) The eGFR has been calculated using the CKD EPI equation. This calculation has not been validated in all clinical situations. eGFR's persistently <60 mL/min signify possible Chronic Kidney Disease.   . Anion  gap 12/08/2015 7  5 - 15 Final  . WBC 12/08/2015 10.1  3.8 - 10.6 K/uL Final  . RBC 12/08/2015 3.89* 4.40 - 5.90 MIL/uL Final  . Hemoglobin 12/08/2015 11.8* 13.0 - 18.0 g/dL Final  . HCT 12/08/2015 32.4* 40.0 - 52.0 % Final  . MCV 12/08/2015 83.3  80.0 - 100.0 fL Final  . MCH 12/08/2015 30.3  26.0 - 34.0 pg Final  . MCHC 12/08/2015 36.4* 32.0 - 36.0 g/dL Final  . RDW 12/08/2015 16.1* 11.5 - 14.5 % Final  . Platelets 12/08/2015 315  150 - 440 K/uL Final  . Sodium, Ur 12/07/2015 78  mmol/L Final  . Creatinine, Urine 12/07/2015 60  mg/dL Final  . Osmolality, Ur 12/07/2015 440  300 - 900 mOsm/kg Final  . Albumin 12/07/2015 3.3* 3.5 - 5.0 g/dL Final  . Glucose-Capillary 12/07/2015 132* 65 - 99 mg/dL Final  . Glucose-Capillary 12/07/2015 133* 65 - 99 mg/dL Final  . Magnesium 12/08/2015 1.9  1.7 - 2.4 mg/dL Final  . POCT Glucose ([facility]) 12/08/2015 112* 70 - 99 mg/dL Final    Assessment:  Kent [name] is a 60 y.o. 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  Tumor was p16 IHC positive (high risk HPV).  CT soft tissue neck on 08/17/2015 revealed a slowly progressive over years, infiltrative, deep 4.1 x 5.4 cm LEFT parotid mass with widespread ipsilateral bulky adenopathy.  [facility] on 09/17/2015 revealed left base of tongue malignancy corresponding to asymmetric enhancement identified on the neck CT with hypermetabolic extensive conglomerate left cervical adenopathy (level II-IV) extending from the level of the angle of the mandible down inferiorly to the level of the cricoid cartilage.  There was clustered subcentimeter left level IB submandibular nodes with mild FDG activity.  There was no evidence of metastatic disease.  Alpha gal IgE was 1.21 (< 0.35) c/w IgE antibodies to galactose-alpha-1,3 galactose (oligosaccharide part on Fab portion of the cetuximab heavy chain) and  suggestive of potential cetuximab reaction.  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  Pathologic stage was T4NxM0.  He took imatinib for 1 year.  He is day 7 s/p 3 cycles of cisplatin (10/22/2015 - 12/04/2015) with concurrent radiation.  Left neck adenopathy has dramatically improved.  He has recurrent hyponatremia.  Sodium was 122 on 11/15/2015 prompting overnight admission for IVF (NS).  He has received daily IVF last week.   Symptomatically, he feels better today.  He ate well last evening.  Fluid intake has improved.  Sodium is 132.  Potassium is normal on supplementation.  Magnesium is normal s/p IV magnesium yesterday.  He has thrush.  Plan: 1.  Labs today:  CBC with diff, BMP, Mg.   3.  Rx:  fluconazole 200 mg po today then 100 mg po q day (10 day supply). 4.  Continue oral potassium. 5.  Continue good oral hydration and caloric intake. 6.  Remind patient to take post cisplatin Decadron. 7.  RTC daily for IVF this week 8.  RTC on 12/07/2015 for BMP +/- IV Mg 9.  RTC on 12/10/2015 for MD assessment, labs (CBC with diff, BMP, Mg) +/- IVF and Mg   Lequita Asal, MD  12/09/2015, 7:06 AM

## 2015-12-10 ENCOUNTER — Observation Stay: Payer: Managed Care, Other (non HMO)

## 2015-12-10 ENCOUNTER — Ambulatory Visit: Payer: Managed Care, Other (non HMO)

## 2015-12-10 ENCOUNTER — Inpatient Hospital Stay: Payer: Managed Care, Other (non HMO)

## 2015-12-10 ENCOUNTER — Inpatient Hospital Stay: Payer: Managed Care, Other (non HMO) | Admitting: Hematology and Oncology

## 2015-12-10 DIAGNOSIS — R112 Nausea with vomiting, unspecified: Secondary | ICD-10-CM | POA: Diagnosis present

## 2015-12-10 LAB — GLUCOSE, CAPILLARY
GLUCOSE-CAPILLARY: 74 mg/dL (ref 65–99)
GLUCOSE-CAPILLARY: 112 mg/dL — AB (ref 65–99)
GLUCOSE-CAPILLARY: 111 mg/dL — AB (ref 65–99)
Glucose-Capillary: 130 mg/dL — ABNORMAL HIGH (ref 65–99)
Glucose-Capillary: 140 mg/dL — ABNORMAL HIGH (ref 65–99)
Glucose-Capillary: 124 mg/dL — ABNORMAL HIGH (ref 65–99)

## 2015-12-10 LAB — CBC
HEMATOCRIT: 31.5 % — AB (ref 40.0–52.0)
HEMOGLOBIN: 11.4 g/dL — AB (ref 13.0–18.0)
MCH: 29.9 pg (ref 26.0–34.0)
MCHC: 36.1 g/dL — ABNORMAL HIGH (ref 32.0–36.0)
MCV: 82.9 fL (ref 80.0–100.0)
Platelets: 210 10*3/uL (ref 150–440)
RBC: 3.8 MIL/uL — AB (ref 4.40–5.90)
RDW: 15.8 % — ABNORMAL HIGH (ref 11.5–14.5)
WBC: 10.5 10*3/uL (ref 3.8–10.6)

## 2015-12-10 LAB — BASIC METABOLIC PANEL
ANION GAP: 6 (ref 5–15)
BUN: 15 mg/dL (ref 6–20)
CALCIUM: 7.8 mg/dL — AB (ref 8.9–10.3)
CO2: 29 mmol/L (ref 22–32)
Chloride: 94 mmol/L — ABNORMAL LOW (ref 101–111)
Creatinine, Ser: 0.72 mg/dL (ref 0.61–1.24)
Glucose, Bld: 115 mg/dL — ABNORMAL HIGH (ref 65–99)
POTASSIUM: 3.2 mmol/L — AB (ref 3.5–5.1)
Sodium: 129 mmol/L — ABNORMAL LOW (ref 135–145)

## 2015-12-10 LAB — MAGNESIUM: MAGNESIUM: 1.2 mg/dL — AB (ref 1.7–2.4)

## 2015-12-10 IMAGING — DX DG CHEST 1V PORT
1 series · 1 of 1 positions shown · non-contrast
Comparison: Chest radiograph [DATE]

CLINICAL DATA: Salivary gland cancer. Chemotherapy. Vomiting and
coughing.

EXAM:
PORTABLE CHEST 1 VIEW

[chest ap]
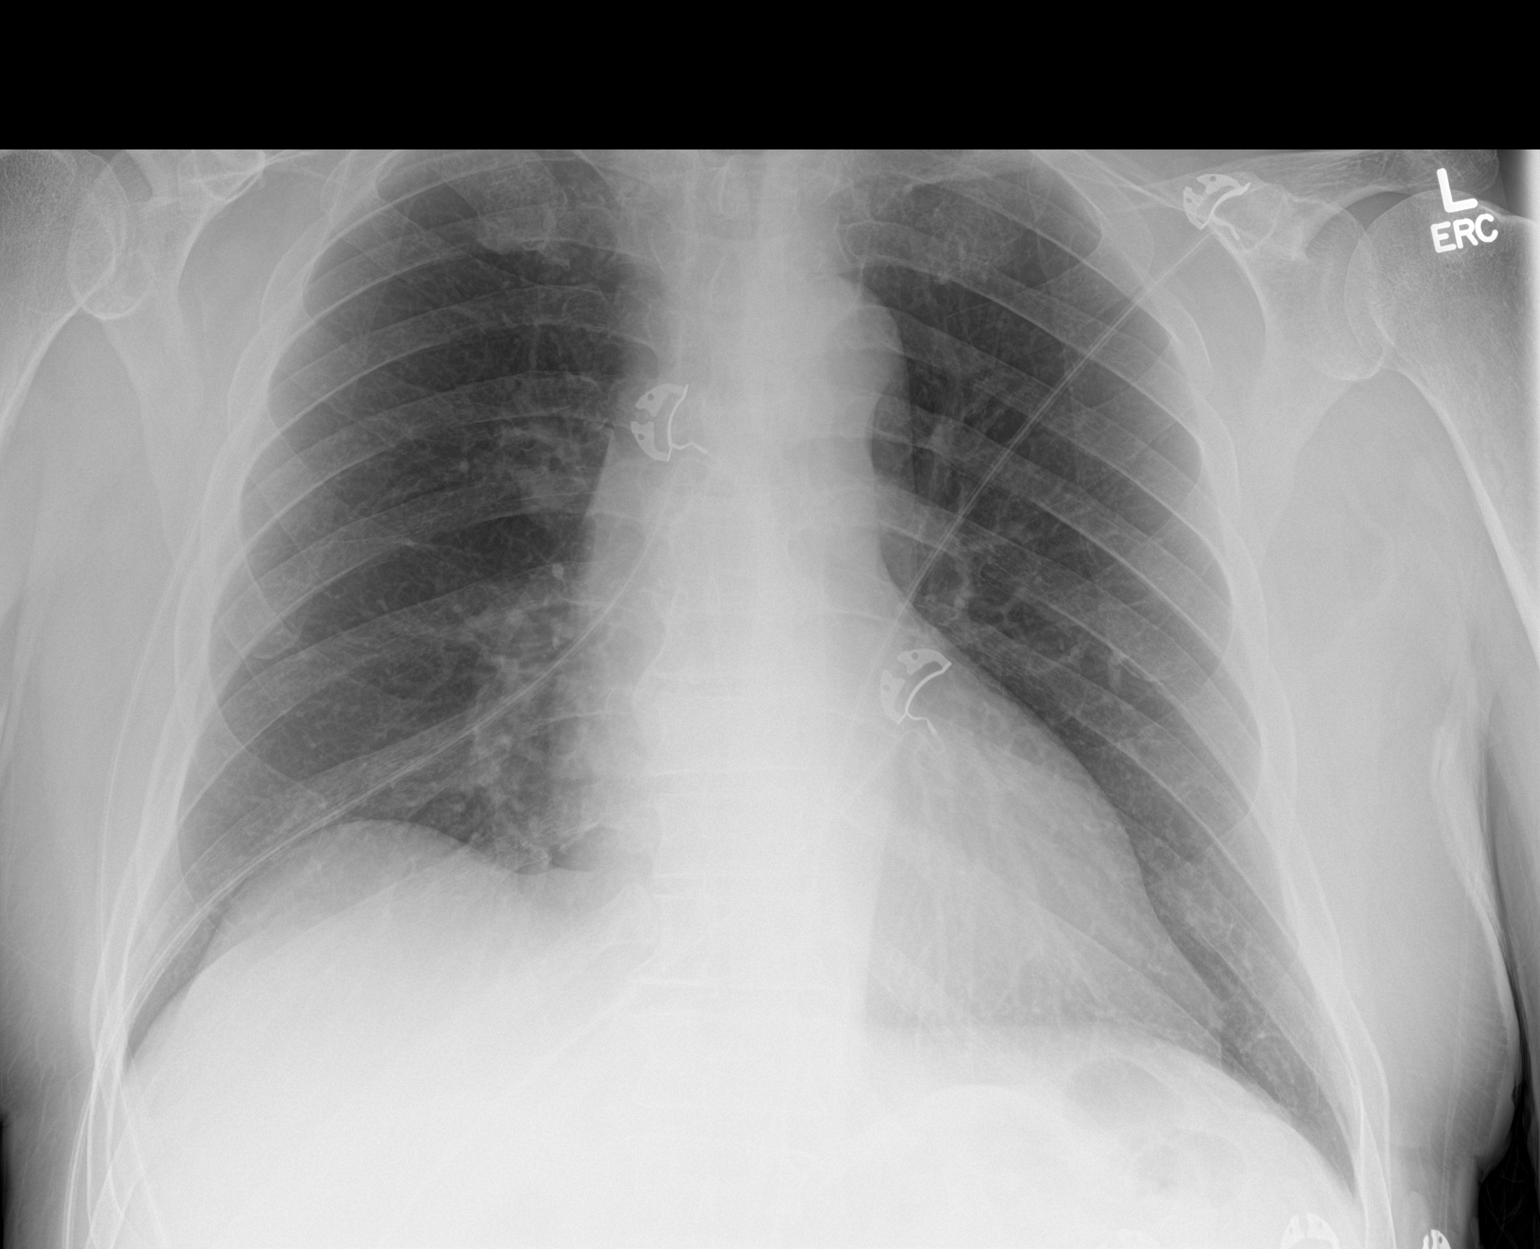

[1 of 1 positions shown; findings below may reference images not displayed]

FINDINGS: Lungs are well inflated. Cardiomediastinal contours are normal. No
pleural effusion or pneumothorax. No focal airspace consolidation or
pulmonary edema.
IMPRESSION: Clear lungs.

## 2015-12-10 MED ORDER — ONDANSETRON HCL 4 MG/2ML IJ SOLN
4.0000 mg | Freq: Four times a day (QID) | INTRAMUSCULAR | Status: AC
  Administered 2015-12-10 – 2015-12-12 (×8): 4 mg via INTRAVENOUS
  Filled 2015-12-10 (×8): qty 2

## 2015-12-10 MED ORDER — PROMETHAZINE HCL 25 MG/ML IJ SOLN: 12.5000 mg | Freq: Four times a day (QID) | INTRAMUSCULAR | Status: DC | PRN

## 2015-12-10 MED ORDER — ENOXAPARIN SODIUM 40 MG/0.4ML ~~LOC~~ SOLN
40.0000 mg | SUBCUTANEOUS | Status: DC
  Administered 2015-12-10 – 2015-12-11 (×2): 40 mg via SUBCUTANEOUS
  Filled 2015-12-10 (×2): qty 0.4

## 2015-12-10 MED ORDER — ALUM & MAG HYDROXIDE-SIMETH 200-200-20 MG/5ML PO SUSP
30.0000 mL | Freq: Four times a day (QID) | ORAL | Status: DC | PRN
  Administered 2015-12-10 – 2015-12-11 (×2): 30 mL via ORAL
  Filled 2015-12-10 (×2): qty 30

## 2015-12-10 MED ORDER — ACETAMINOPHEN 325 MG PO TABS: 650.0000 mg | ORAL_TABLET | Freq: Four times a day (QID) | ORAL | Status: DC | PRN

## 2015-12-10 MED ORDER — ONDANSETRON HCL 4 MG PO TABS: 4.0000 mg | ORAL_TABLET | Freq: Four times a day (QID) | ORAL | Status: DC | PRN

## 2015-12-10 MED ORDER — DEXAMETHASONE 4 MG PO TABS
2.0000 mg | ORAL_TABLET | Freq: Two times a day (BID) | ORAL | Status: DC
  Administered 2015-12-10 – 2015-12-12 (×5): 2 mg via ORAL
  Filled 2015-12-10 (×5): qty 1

## 2015-12-10 MED ORDER — SODIUM CHLORIDE 1 G PO TABS
1.0000 g | ORAL_TABLET | Freq: Two times a day (BID) | ORAL | Status: DC
  Administered 2015-12-10 – 2015-12-12 (×5): 1 g via ORAL
  Filled 2015-12-10 (×5): qty 1

## 2015-12-10 MED ORDER — LISINOPRIL 20 MG PO TABS
20.0000 mg | ORAL_TABLET | Freq: Every day | ORAL | Status: DC
  Administered 2015-12-10 – 2015-12-12 (×3): 20 mg via ORAL
  Filled 2015-12-10 (×3): qty 1

## 2015-12-10 MED ORDER — MAGNESIUM SULFATE 4 GM/100ML IV SOLN
4.0000 g | Freq: Once | INTRAVENOUS | Status: AC
  Administered 2015-12-10: 16:00:00 4 g via INTRAVENOUS
  Filled 2015-12-10: qty 100

## 2015-12-10 MED ORDER — INSULIN ASPART 100 UNIT/ML ~~LOC~~ SOLN
0.0000 [IU] | Freq: Three times a day (TID) | SUBCUTANEOUS | Status: DC
  Administered 2015-12-10 – 2015-12-11 (×5): 1 [IU] via SUBCUTANEOUS
  Administered 2015-12-12: 2 [IU] via SUBCUTANEOUS
  Filled 2015-12-10: qty 2
  Filled 2015-12-10 (×5): qty 1

## 2015-12-10 MED ORDER — OXYCODONE-ACETAMINOPHEN 5-325 MG PO TABS
1.0000 | ORAL_TABLET | Freq: Four times a day (QID) | ORAL | Status: DC | PRN
  Administered 2015-12-10 – 2015-12-11 (×4): 1 via ORAL
  Filled 2015-12-10 (×4): qty 1

## 2015-12-10 MED ORDER — ATORVASTATIN CALCIUM 20 MG PO TABS
20.0000 mg | ORAL_TABLET | Freq: Every day | ORAL | Status: DC
  Administered 2015-12-10 – 2015-12-12 (×3): 20 mg via ORAL
  Filled 2015-12-10 (×3): qty 1

## 2015-12-10 MED ORDER — INSULIN ASPART 100 UNIT/ML ~~LOC~~ SOLN: 0.0000 [IU] | Freq: Every day | SUBCUTANEOUS | Status: DC

## 2015-12-10 MED ORDER — ACETAMINOPHEN 650 MG RE SUPP: 650.0000 mg | Freq: Four times a day (QID) | RECTAL | Status: DC | PRN

## 2015-12-10 MED ORDER — ONDANSETRON HCL 4 MG/2ML IJ SOLN: 4.0000 mg | Freq: Four times a day (QID) | INTRAMUSCULAR | Status: DC | PRN

## 2015-12-10 MED ORDER — SODIUM CHLORIDE 0.9 % IV SOLN
INTRAVENOUS | Status: AC
  Administered 2015-12-10: 03:00:00 via INTRAVENOUS

## 2015-12-10 MED ORDER — SODIUM CHLORIDE 0.9 % IV SOLN
INTRAVENOUS | Status: AC
  Administered 2015-12-10: 12:00:00 via INTRAVENOUS

## 2015-12-10 MED ORDER — POTASSIUM CHLORIDE CRYS ER 20 MEQ PO TBCR
40.0000 meq | EXTENDED_RELEASE_TABLET | ORAL | Status: AC
  Administered 2015-12-10 (×2): 40 meq via ORAL
  Filled 2015-12-10 (×2): qty 2

## 2015-12-10 MED ORDER — LORAZEPAM 0.5 MG PO TABS: 0.5000 mg | ORAL_TABLET | Freq: Four times a day (QID) | ORAL | Status: DC | PRN

## 2015-12-10 MED ORDER — MAGIC MOUTHWASH
5.0000 mL | Freq: Four times a day (QID) | ORAL | Status: DC | PRN
  Administered 2015-12-10 – 2015-12-11 (×4): 5 mL via ORAL
  Filled 2015-12-10 (×4): qty 10

## 2015-12-10 NOTE — ED Provider Notes (Signed)
Sign off from Dr. Reita Cliche interviewed the patient's labs and admit the patient. Physical Exam  BP 117/79   Pulse 64   Resp 17   Ht '5\' 7"'$  (1.702 m)   Wt 186 lb (84.4 kg)   SpO2 99%   BMI 29.13 kg/m   Physical Exam  ED Course  Procedures  MDM ----------------------------------------- 1:02 AM on 12/10/2015 -----------------------------------------  Patient with mild hyponatremia. Also with elevated white blood cell count. No critical values at this time. Signed out to Dr. Jannifer Franklin for additional hospital.       Kent Pyo, MD 12/10/15 [phone]

## 2015-12-10 NOTE — H&P (Signed)
[facility] [place] NAME: Kent [name]    MR#:  [id number]  DATE OF BIRTH:  1955/04/13  DATE OF ADMISSION:  12/09/2015  PRIMARY CARE PHYSICIAN: Kathrine Haddock, NP   REQUESTING/REFERRING PHYSICIAN: Clearnce Hasten, MD  CHIEF COMPLAINT:   Chief Complaint  Patient presents with  . Emesis    HISTORY OF PRESENT ILLNESS:  Kent [name]  is a 60 y.o. male who presents with intractable nausea and vomiting.  Patient has tongue base cancer and is undergoing active treatment with chemo-radiation therapy.  He had his last chemo treatment last week with 5 pending radiation treatments scheduled per his report.  Since his last chemo treatment he has had intermittent, though over the last few days increasing, nausea and vomiting.   He called oncology clinic tonight and was advised to come to the ED for admission for hydration and evaluation.  Here he was found to have a leukocytosis with bandemia, though no clear infectious etiology.  He specifically denies fever/chills, cough, dysuria, abd pain.  Hospitalists were called for admission and further evaluation and treatment.  PAST MEDICAL HISTORY:   Past Medical History:  Diagnosis Date  . Arthritis   . Diabetes mellitus without complication ([place])   . Hyperlipidemia   . Hypertension   . Obesity   . Stomach cancer ([place])    had tumor removed  . Tongue cancer ([place])     PAST SURGICAL HISTORY:   Past Surgical History:  Procedure Laterality Date  . gastric tumor removed  2010    SOCIAL HISTORY:   Social History  Substance Use Topics  . Smoking status: Never Smoker  . Smokeless tobacco: Never Used  . Alcohol use No    FAMILY HISTORY:   Family History  Problem Relation Age of Onset  . Heart disease Mother   . Hypertension Father   . Cancer Sister     stomach  . Heart disease Brother     MI  . Cancer Daughter   . Cancer Son     testicular  . ADD / ADHD Sister     DRUG ALLERGIES:  No  Known Allergies  MEDICATIONS AT HOME:   Prior to Admission medications   Medication Sig Start Date End Date Taking? Authorizing Provider  atorvastatin (LIPITOR) 20 MG tablet Take 1 tablet (20 mg total) by mouth daily. Reported on 04/06/2015 04/06/15  Yes Kathrine Haddock, NP  cyclobenzaprine (FLEXERIL) 10 MG tablet Take 1 tablet (10 mg total) by mouth at bedtime. 04/06/15  Yes Kathrine Haddock, NP  dexamethasone (DECADRON) 2 MG tablet Take 1 tablet (2 mg total) by mouth 2 (two) times daily. 11/28/15  Yes Noreene Filbert, MD  Insulin Glargine (LANTUS) 100 UNIT/ML Solostar Pen Inject 36 Units into the skin daily at 10 pm. 04/06/15  Yes Kathrine Haddock, NP  lisinopril (PRINIVIL,ZESTRIL) 20 MG tablet Take 1 tablet (20 mg total) by mouth daily. 11/16/15  Yes Theodoro Grist, MD  LORazepam (ATIVAN) 0.5 MG tablet Take 1 tablet (0.5 mg total) by mouth every 6 (six) hours as needed (Nausea or vomiting). 10/22/15  Yes Lequita Asal, MD  magic mouthwash SOLN Take 5 mLs by mouth 4 (four) times daily as needed for mouth pain. 12/03/15  Yes Lequita Asal, MD  meloxicam (MOBIC) 15 MG tablet Take 1 tablet (15 mg total) by mouth daily. 05/07/15  Yes Roderic Palau D Cuthriell, PA-C  menthol-cetylpyridinium (CEPACOL) 3 MG lozenge Take 1 lozenge (3 mg total) by mouth  as needed for sore throat. 11/16/15  Yes Theodoro Grist, MD  metFORMIN (GLUCOPHAGE) 1000 MG tablet Take 1 tablet (1,000 mg total) by mouth 2 (two) times daily with a meal. 04/06/15  Yes Kathrine Haddock, NP  ondansetron (ZOFRAN) 8 MG tablet Take 1 tablet (8 mg total) by mouth 2 (two) times daily as needed. Start on the third day after chemotherapy. 10/22/15  Yes Lequita Asal, MD  oxyCODONE-acetaminophen (PERCOCET/ROXICET) 5-325 MG tablet Take 1 tablet by mouth every 6 (six) hours as needed for severe pain. 12/05/15  Yes Cammie Sickle, MD  potassium chloride (K-DUR) 10 MEQ tablet Take 1 tablet (10 mEq total) by mouth 2 (two) times daily. 11/30/15  Yes Lequita Asal,  MD  prochlorperazine (COMPAZINE) 10 MG tablet Take 1 tablet (10 mg total) by mouth every 6 (six) hours as needed (Nausea or vomiting). 11/20/15  Yes Lequita Asal, MD  sildenafil (VIAGRA) 50 MG tablet Take 50 mg by mouth daily as needed for erectile dysfunction.   Yes Historical Provider, MD  sucralfate (CARAFATE) 1 g tablet Take 1 tablet (1 g total) by mouth 3 (three) times daily. Dissolve in 2 to 3 tbs warm water, swish &swallow 11/01/15  Yes Noreene Filbert, MD  blood glucose meter kit and supplies KIT Dispense based on patient and insurance preference. Use up to four times daily as directed. (FOR ICD-9 250.00, 250.01). 10/02/15   Kathrine Haddock, NP  UNIFINE PENTIPS 31G X 6 MM MISC 4 (four) times daily. 10/01/15   Historical Provider, MD    REVIEW OF SYSTEMS:  Review of Systems  Constitutional: Positive for malaise/fatigue. Negative for chills, fever and weight loss.  HENT: Negative for ear pain, hearing loss and tinnitus.   Eyes: Negative for blurred vision, double vision, pain and redness.  Respiratory: Negative for cough, hemoptysis and shortness of breath.   Cardiovascular: Negative for chest pain, palpitations, orthopnea and leg swelling.  Gastrointestinal: Positive for nausea and vomiting. Negative for abdominal pain, constipation and diarrhea.  Genitourinary: Negative for dysuria, frequency and hematuria.  Musculoskeletal: Negative for back pain, joint pain and neck pain.  Skin:       No acne, rash, or lesions  Neurological: Negative for dizziness, tremors, focal weakness and weakness.  Endo/Heme/Allergies: Negative for polydipsia. Does not bruise/bleed easily.  Psychiatric/Behavioral: Negative for depression. The patient is not nervous/anxious and does not have insomnia.      VITAL SIGNS:   Vitals:   12/09/15 2231 12/09/15 2330 12/10/15 0000 12/10/15 0030  BP: (!) 140/94 103/74 108/87 117/79  Pulse: (!) 126 78 70 64  Resp: 18 17 15 17   SpO2: 94% 97% 91% 99%  Weight: 84.4 kg  (186 lb)     Height: 5' 7"  (1.702 m)      Wt Readings from Last 3 Encounters:  12/09/15 84.4 kg (186 lb)  12/08/15 84.8 kg (186 lb 14.4 oz)  12/04/15 84.7 kg (186 lb 11.7 oz)    PHYSICAL EXAMINATION:  Physical Exam  Vitals reviewed. Constitutional: He is oriented to person, place, and time. He appears well-developed and well-nourished. No distress.  HENT:  Head: Normocephalic and atraumatic.  Dry mucous membranes  Eyes: Conjunctivae and EOM are normal. Pupils are equal, round, and reactive to light. No scleral icterus.  Neck: Normal range of motion. Neck supple. No JVD present. No thyromegaly present.  Cardiovascular: Normal rate, regular rhythm and intact distal pulses.  Exam reveals no gallop and no friction rub.   No murmur heard. Respiratory:  Effort normal and breath sounds normal. No respiratory distress. He has no wheezes. He has no rales.  GI: Soft. Bowel sounds are normal. He exhibits no distension. There is no tenderness.  Musculoskeletal: Normal range of motion. He exhibits no edema.  No arthritis, no gout  Lymphadenopathy:    He has no cervical adenopathy.  Neurological: He is alert and oriented to person, place, and time. No cranial nerve deficit.  No dysarthria, no aphasia  Skin: Skin is warm and dry. No rash noted. No erythema.  Psychiatric: He has a normal mood and affect. His behavior is normal. Judgment and thought content normal.    LABORATORY PANEL:   CBC  Recent Labs Lab 12/09/15 2316  WBC 14.6*  HGB 12.8*  HCT 34.7*  PLT 262   ------------------------------------------------------------------------------------------------------------------  Chemistries   Recent Labs Lab 12/08/15 0452 12/09/15 2316  NA 132* 127*  K 3.0* 3.2*  CL 95* 91*  CO2 30 27  GLUCOSE 77 146*  BUN 24* 18  CREATININE 1.01 0.82  CALCIUM 8.1* 8.3*  MG 1.9  --   AST  --  24  ALT  --  47  ALKPHOS  --  80  BILITOT  --  1.1    ------------------------------------------------------------------------------------------------------------------  Cardiac Enzymes  Recent Labs Lab 12/09/15 2316  TROPONINI <0.03   ------------------------------------------------------------------------------------------------------------------  RADIOLOGY:  No results found.  EKG:   Orders placed or performed during the hospital encounter of 12/09/15  . ED EKG  . ED EKG    IMPRESSION AND PLAN:  Principal Problem:   Intractable nausea and vomiting - IV hydration, when necessary antiemetics.  This may be secondary to chemo, though infection is not completely ruled out upfront.  CXR and blood cultures and urine studies added to initial ED workup. Active Problems:   Hypertension - stable, continue home meds   Poorly controlled type 2 diabetes mellitus (HCC) - sliding scale insulin with corresponding glucose checks and carb modified diet   Carcinoma of base of tongue ([place]) - actively being treated by oncology, will consult them for additional recommendations   Hyperlipidemia - continue home meds  All the records are reviewed and case discussed with ED provider. Management plans discussed with the patient and/or family.  DVT PROPHYLAXIS: SubQ lovenox  GI PROPHYLAXIS: None  ADMISSION STATUS: Observation  CODE STATUS: Full Code Status History    Date Active Date Inactive Code Status Order ID Comments User Context   12/07/2015  5:13 PM 12/08/2015  5:39 PM Full Code [id number]  Hillary Bow, MD Inpatient   11/15/2015 12:19 PM 11/16/2015  8:00 PM Full Code [id number]  Dustin Flock, MD Inpatient      TOTAL TIME TAKING CARE OF THIS PATIENT: 40 minutes.    Valaria Kohut [place] 12/10/2015, 1:31 AM  [facility]  Office  [phone]  CC: Primary care physician; Kathrine Haddock, NP

## 2015-12-10 NOTE — ED Notes (Signed)
Attempted to [facility] hospitalist about potential GI cocktail. No answer at this time. RN on floor has been made aware and pt will be sent upstairs and Catalina [place] will request medication for pts reported GERD when able.

## 2015-12-10 NOTE — Progress Notes (Signed)
[facility] [place] NAME: Kent [name]    MR#:  [id number]  DATE OF BIRTH:  May 25, 1955  SUBJECTIVE:  CHIEF COMPLAINT:   Chief Complaint  Patient presents with  . Emesis   - Patient with head and neck cancer, recurrent admissions for hyponatremia and nausea vomiting. -Came in with refractory nausea and vomiting. Feels some better, had one more emesis episode today. -Awaiting oncology input as his oncologist discussed about PICC line and TPN for alternative source of nutrition  REVIEW OF SYSTEMS:  Review of Systems  Constitutional: Negative for chills and fever.  HENT: Positive for sore throat. Negative for ear discharge, ear pain and tinnitus.   Eyes: Negative for blurred vision and double vision.  Respiratory: Negative for cough, shortness of breath and wheezing.   Cardiovascular: Negative for chest pain and palpitations.  Gastrointestinal: Positive for nausea and vomiting. Negative for abdominal pain, constipation and diarrhea.  Genitourinary: Negative for dysuria.  Musculoskeletal: Negative for myalgias.  Neurological: Negative for dizziness, sensory change, speech change, focal weakness, seizures and headaches.  Psychiatric/Behavioral: Negative for depression.    DRUG ALLERGIES:  No Known Allergies  VITALS:  Blood pressure 133/89, pulse 72, temperature 98.7 F (37.1 C), temperature source Oral, resp. rate 20, height '5\' 7"'$  (1.702 m), weight 80.4 kg (177 lb 3 oz), SpO2 99 %.  PHYSICAL EXAMINATION:  Physical Exam  GENERAL:  60 y.o.-year-old patient Sitting in the bed with no acute distress.  EYES: Pupils equal, round, reactive to light and accommodation. No scleral icterus. Extraocular muscles intact.  HEENT: Head atraumatic, normocephalic. Oropharynx and nasopharynx clear.  NECK:  Supple, no jugular venous distention. No thyroid enlargement, no tenderness.  LUNGS: Normal breath sounds bilaterally, no wheezing, rales,rhonchi or  crepitation. No use of accessory muscles of respiration. Decreased bibasilar breath sounds CARDIOVASCULAR: S1, S2 normal. No murmurs, rubs, or gallops.  ABDOMEN: Soft, nontender, nondistended. Bowel sounds present. No organomegaly or mass.  EXTREMITIES: No pedal edema, cyanosis, or clubbing.  NEUROLOGIC: Cranial nerves II through XII are intact. Muscle strength 5/5 in all extremities. Sensation intact. Gait not checked.  PSYCHIATRIC: The patient is alert and oriented x 3.  SKIN: No obvious rash, lesion, or ulcer.    LABORATORY PANEL:   CBC  Recent Labs Lab 12/10/15 0513  WBC 10.5  HGB 11.4*  HCT 31.5*  PLT 210   ------------------------------------------------------------------------------------------------------------------  Chemistries   Recent Labs Lab 12/09/15 2316 12/10/15 0513  NA 127* 129*  K 3.2* 3.2*  CL 91* 94*  CO2 27 29  GLUCOSE 146* 115*  BUN 18 15  CREATININE 0.82 0.72  CALCIUM 8.3* 7.8*  MG  --  1.2*  AST 24  --   ALT 47  --   ALKPHOS 80  --   BILITOT 1.1  --    ------------------------------------------------------------------------------------------------------------------  Cardiac Enzymes  Recent Labs Lab 12/09/15 2316  TROPONINI <0.03   ------------------------------------------------------------------------------------------------------------------  RADIOLOGY:  Dg Chest Port 1 View  Result Date: 12/10/2015 CLINICAL DATA:  Salivary gland cancer. Chemotherapy. Vomiting and coughing. EXAM: PORTABLE CHEST 1 VIEW COMPARISON:  Chest radiograph 02/14/2013 FINDINGS: Lungs are well inflated. Cardiomediastinal contours are normal. No pleural effusion or pneumothorax. No focal airspace consolidation or pulmonary edema. IMPRESSION: Clear lungs. Electronically Signed   By: Ulyses Jarred M.D.   On: 12/10/2015 02:15    EKG:   Orders placed or performed during the hospital encounter of 12/09/15  . ED EKG  . ED EKG  ASSESSMENT AND PLAN:    60 year old male with past medical history significant for head and neck cancer status post chemoradiation, hypertension and diabetes presents to the hospital secondary to nausea and vomiting  #1 nausea and vomiting- likely from chemo and odynophagia from radiation trts - zofran scheduled for today, phenergan IV prn - IV fluids - Due to frequent nausea and vomiting, electrolyte abnormalities, oncology has discussed possible PICC line placement for additional TPN. We'll await oncology input about this prior to ordering the PICC line.  #2 Hyponatremia- has chronic hyponatremia and recurrent. hypovolemic hyponatremia. From decreased oral intake. -IV fluids. 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 - fluids and salt tabs  #3 hypokalemia- being replaced  #4 diabetes mellitus- only on SSI now as poor PO intake Monitor sugars  #5 head and neck cancer-following up with Dr. Mike Gip. Finished chemotherapy. Still has few more radiation treatments to go. -Mucosal ulcerations from the radiation. Magic mouth wash - has decadron orally. - mgmt per oncology  #6 DVT Prophylaxis- on lovenox    All the records are reviewed and case discussed with Care Management/Social Workerr. Management plans discussed with the patient, family and they are in agreement.  CODE STATUS: Full code  TOTAL TIME TAKING CARE OF THIS PATIENT: 37 minutes.   POSSIBLE D/C IN 1-2 DAYS, DEPENDING ON CLINICAL CONDITION.   Ramyah Pankowski M.D on 12/10/2015 at 1:30 PM  Between 7am to 6pm - Pager - [phone]  After 6pm go to [email or web address] - password [email or web address] [facility]  Office  [phone]  CC: Primary care physician; Kathrine Haddock, NP

## 2015-12-10 NOTE — ED Notes (Signed)
Radiology at bedside

## 2015-12-10 NOTE — Progress Notes (Signed)
While Ch was making his rounds he visited pt in Rm 122. Provided the ministry of prayer and spiritual support to family. Ch is available for follow up as needed.    12/10/15 1120  Clinical Encounter Type  Visited With Patient and family together  Visit Type Initial;Spiritual support  Referral From Patient;Nurse  Spiritual Encounters  Spiritual Needs Prayer;Emotional

## 2015-12-10 NOTE — Progress Notes (Signed)
MEDICATION RELATED CONSULT NOTE - INITIAL   Pharmacy Consult for electrolyte monitoring Indication: hypomagnesemia, hypokalemia  No Known Allergies  Patient Measurements: Height: '5\' 7"'$  (170.2 cm) Weight: 177 lb 3 oz (80.4 kg) IBW/kg (Calculated) : 66.1  Vital Signs: Temp: 98.3 F (36.8 C) (10/09 1335) Temp Source: Oral (10/09 0922) BP: 145/92 (10/09 1335) Pulse Rate: 66 (10/09 1335) Intake/Output from previous day: 10/08 0701 - 10/09 0700 In: 226.7 [P.O.:120; I.V.:106.7] Out: -  Intake/Output from this shift: Total I/O In: 360 [P.O.:360] Out: 400 [Urine:400]  Labs:  Recent Labs  12/07/15 1843  12/07/15 2102 12/08/15 0452 12/09/15 2316 12/10/15 0513  WBC  --   < > 11.5* 10.1 14.6* 10.5  HGB  --   < > 11.5* 11.8* 12.8* 11.4*  HCT  --   < > 31.9* 32.4* 34.7* 31.5*  PLT  --   < > 318 315 262 210  CREATININE  --   --   --  1.01 0.82 0.72  LABCREA 60  --   --   --   --   --   MG  --   --   --  1.9  --  1.2*  ALBUMIN  --   --  3.3*  --  3.6  --   PROT  --   --   --   --  6.5  --   AST  --   --   --   --  24  --   ALT  --   --   --   --  47  --   ALKPHOS  --   --   --   --  80  --   BILITOT  --   --   --   --  1.1  --   < > = values in this interval not displayed. Estimated Creatinine Clearance: 98.5 mL/min (by C-G formula based on SCr of 0.72 mg/dL).   Medical History: Past Medical History:  Diagnosis Date  . Arthritis   . Diabetes mellitus without complication ([place])   . Hyperlipidemia   . Hypertension   . Obesity   . Stomach cancer ([place])    had tumor removed  . Tongue cancer [facility])    Assessment: Pharmacy consulted to monitor and replace electrolytes in this 60 year old male with hypomagnesemia and hypokalemia.   K = 3.2 Mg = 1.2  Goal of Therapy:  Electrolytes within normal limits  Plan:  MD ordered potassium chloride 40 mEq PO every 4 hours for 2 doses and magnesium 4 g IV once. Will recheck electrolytes with AM labs tomorrow.   Lenis Noon,  PharmD, BCPS Clinical Pharmacist 12/10/2015,2:20 PM

## 2015-12-11 ENCOUNTER — Inpatient Hospital Stay: Payer: Managed Care, Other (non HMO)

## 2015-12-11 ENCOUNTER — Ambulatory Visit: Payer: Managed Care, Other (non HMO)

## 2015-12-11 DIAGNOSIS — Z79899 Other long term (current) drug therapy: Secondary | ICD-10-CM | POA: Diagnosis not present

## 2015-12-11 DIAGNOSIS — R1311 Dysphagia, oral phase: Secondary | ICD-10-CM | POA: Diagnosis present

## 2015-12-11 DIAGNOSIS — M1990 Unspecified osteoarthritis, unspecified site: Secondary | ICD-10-CM | POA: Diagnosis present

## 2015-12-11 DIAGNOSIS — T508X5A Adverse effect of diagnostic agents, initial encounter: Secondary | ICD-10-CM | POA: Diagnosis present

## 2015-12-11 DIAGNOSIS — Z510 Encounter for antineoplastic radiation therapy: Secondary | ICD-10-CM | POA: Diagnosis not present

## 2015-12-11 DIAGNOSIS — Z794 Long term (current) use of insulin: Secondary | ICD-10-CM | POA: Diagnosis not present

## 2015-12-11 DIAGNOSIS — E1122 Type 2 diabetes mellitus with diabetic chronic kidney disease: Secondary | ICD-10-CM | POA: Diagnosis present

## 2015-12-11 DIAGNOSIS — Z85028 Personal history of other malignant neoplasm of stomach: Secondary | ICD-10-CM | POA: Diagnosis not present

## 2015-12-11 DIAGNOSIS — Z809 Family history of malignant neoplasm, unspecified: Secondary | ICD-10-CM | POA: Diagnosis not present

## 2015-12-11 DIAGNOSIS — E119 Type 2 diabetes mellitus without complications: Secondary | ICD-10-CM | POA: Diagnosis not present

## 2015-12-11 DIAGNOSIS — N189 Chronic kidney disease, unspecified: Secondary | ICD-10-CM | POA: Diagnosis present

## 2015-12-11 DIAGNOSIS — K121 Other forms of stomatitis: Secondary | ICD-10-CM | POA: Diagnosis present

## 2015-12-11 DIAGNOSIS — Z7984 Long term (current) use of oral hypoglycemic drugs: Secondary | ICD-10-CM | POA: Diagnosis not present

## 2015-12-11 DIAGNOSIS — E43 Unspecified severe protein-calorie malnutrition: Secondary | ICD-10-CM | POA: Diagnosis present

## 2015-12-11 DIAGNOSIS — E1165 Type 2 diabetes mellitus with hyperglycemia: Secondary | ICD-10-CM | POA: Diagnosis present

## 2015-12-11 DIAGNOSIS — E222 Syndrome of inappropriate secretion of antidiuretic hormone: Secondary | ICD-10-CM | POA: Diagnosis present

## 2015-12-11 DIAGNOSIS — Z9221 Personal history of antineoplastic chemotherapy: Secondary | ICD-10-CM | POA: Diagnosis not present

## 2015-12-11 DIAGNOSIS — Z79891 Long term (current) use of opiate analgesic: Secondary | ICD-10-CM | POA: Diagnosis not present

## 2015-12-11 DIAGNOSIS — E876 Hypokalemia: Secondary | ICD-10-CM | POA: Diagnosis present

## 2015-12-11 DIAGNOSIS — I129 Hypertensive chronic kidney disease with stage 1 through stage 4 chronic kidney disease, or unspecified chronic kidney disease: Secondary | ICD-10-CM | POA: Diagnosis present

## 2015-12-11 DIAGNOSIS — I10 Essential (primary) hypertension: Secondary | ICD-10-CM | POA: Diagnosis not present

## 2015-12-11 DIAGNOSIS — M129 Arthropathy, unspecified: Secondary | ICD-10-CM | POA: Diagnosis not present

## 2015-12-11 DIAGNOSIS — C01 Malignant neoplasm of base of tongue: Secondary | ICD-10-CM | POA: Diagnosis present

## 2015-12-11 DIAGNOSIS — E669 Obesity, unspecified: Secondary | ICD-10-CM | POA: Diagnosis not present

## 2015-12-11 DIAGNOSIS — E878 Other disorders of electrolyte and fluid balance, not elsewhere classified: Secondary | ICD-10-CM | POA: Diagnosis present

## 2015-12-11 DIAGNOSIS — C089 Malignant neoplasm of major salivary gland, unspecified: Secondary | ICD-10-CM | POA: Diagnosis present

## 2015-12-11 DIAGNOSIS — E785 Hyperlipidemia, unspecified: Secondary | ICD-10-CM | POA: Diagnosis not present

## 2015-12-11 DIAGNOSIS — R112 Nausea with vomiting, unspecified: Secondary | ICD-10-CM | POA: Diagnosis present

## 2015-12-11 DIAGNOSIS — Z8249 Family history of ischemic heart disease and other diseases of the circulatory system: Secondary | ICD-10-CM | POA: Diagnosis not present

## 2015-12-11 DIAGNOSIS — E860 Dehydration: Secondary | ICD-10-CM | POA: Diagnosis present

## 2015-12-11 LAB — BASIC METABOLIC PANEL
Anion gap: 5 (ref 5–15)
BUN: 10 mg/dL (ref 6–20)
CALCIUM: 7.8 mg/dL — AB (ref 8.9–10.3)
CO2: 29 mmol/L (ref 22–32)
CREATININE: 0.81 mg/dL (ref 0.61–1.24)
Chloride: 96 mmol/L — ABNORMAL LOW (ref 101–111)
Glucose, Bld: 114 mg/dL — ABNORMAL HIGH (ref 65–99)
Potassium: 3.7 mmol/L (ref 3.5–5.1)
SODIUM: 130 mmol/L — AB (ref 135–145)

## 2015-12-11 LAB — GLUCOSE, CAPILLARY
GLUCOSE-CAPILLARY: 141 mg/dL — AB (ref 65–99)
GLUCOSE-CAPILLARY: 123 mg/dL — AB (ref 65–99)
GLUCOSE-CAPILLARY: 119 mg/dL — AB (ref 65–99)
Glucose-Capillary: 124 mg/dL — ABNORMAL HIGH (ref 65–99)

## 2015-12-11 LAB — MAGNESIUM: MAGNESIUM: 1.6 mg/dL — AB (ref 1.7–2.4)

## 2015-12-11 IMAGING — DX DG CHEST 1V PORT
1 series · 2 of 2 positions shown · non-contrast
Comparison: [DATE]

CLINICAL DATA: PICC placement.

EXAM:
PORTABLE CHEST 1 VIEW

[Series 1: chest ap · 0.14mm/px · 2 of 2 slices shown]
[im 1/2]
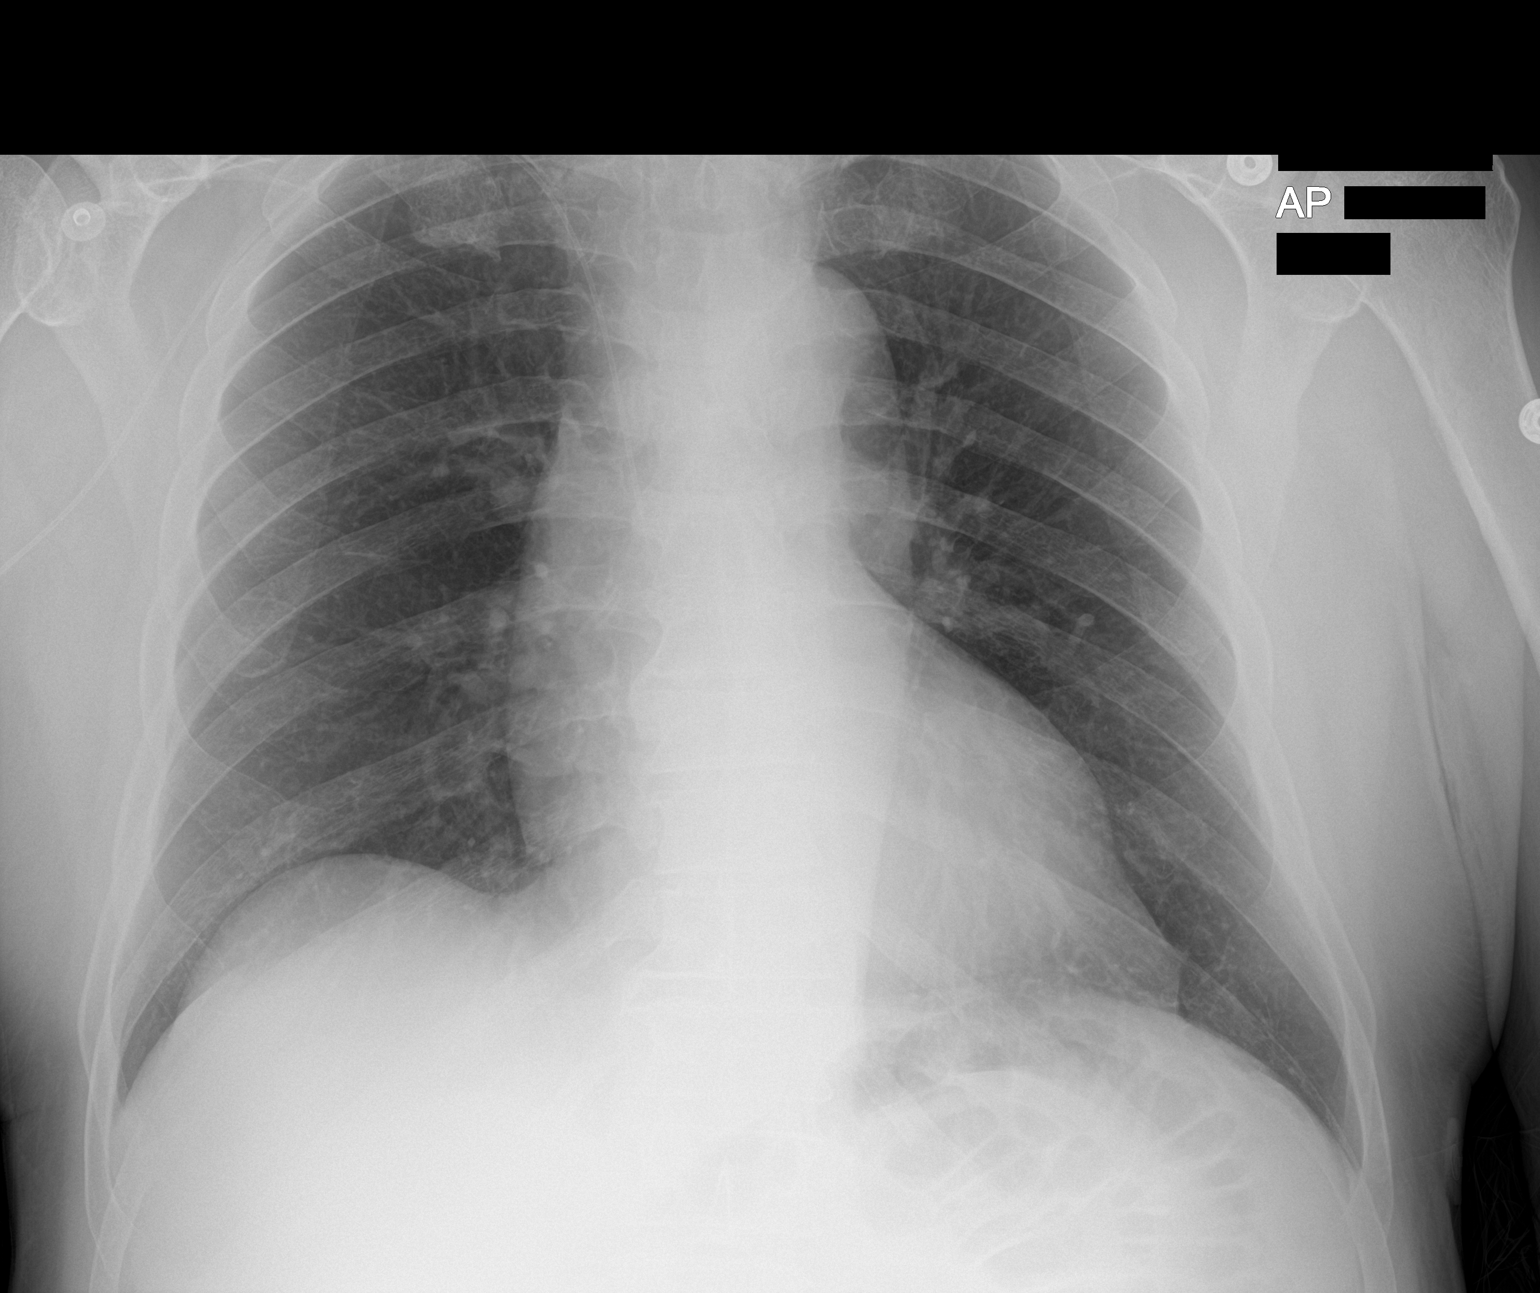
[im 2/2]
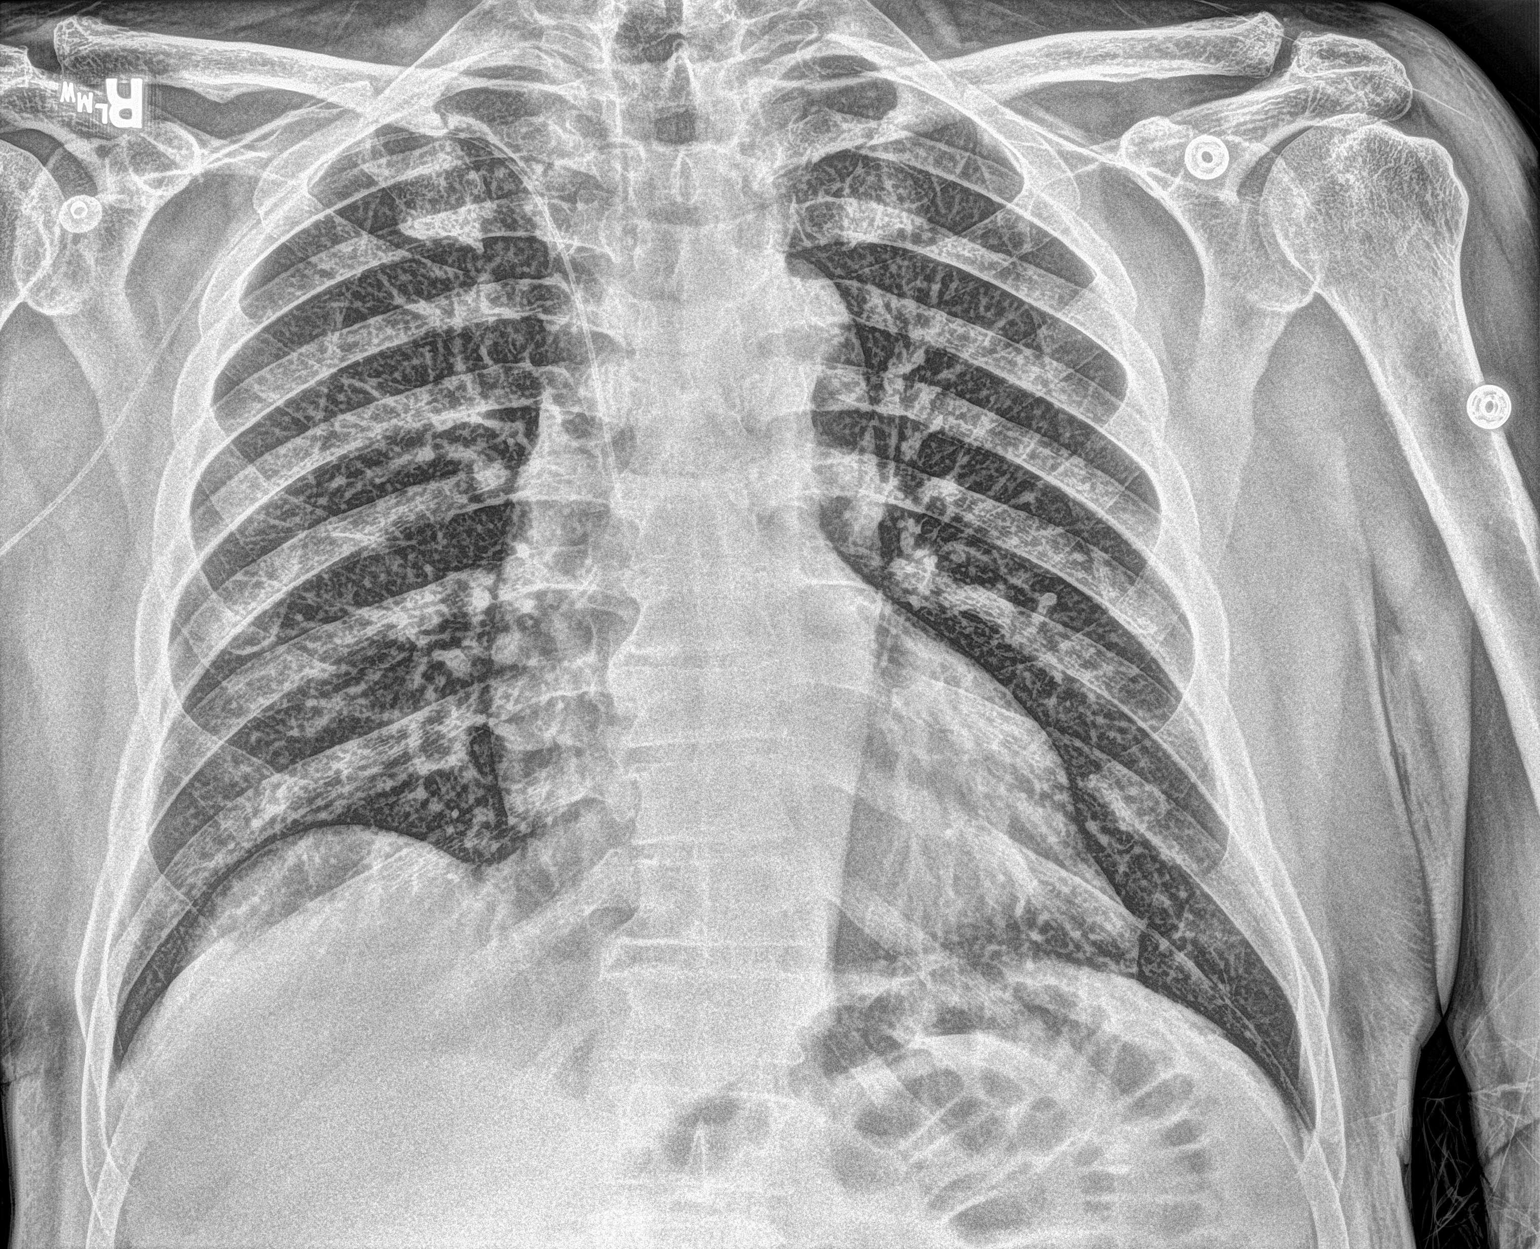

[2 of 2 positions shown; findings below may reference images not displayed]

FINDINGS: Tip of the right upper extremity PICC in the mid distal SVC. No
pneumothorax. Lungs are clear. Unchanged cardiomediastinal contours.
No pleural fluid or focal airspace disease.
IMPRESSION: Tip of the right upper extremity PICC in the mid distal SVC.

## 2015-12-11 MED ORDER — MAGNESIUM SULFATE 2 GM/50ML IV SOLN
2.0000 g | Freq: Once | INTRAVENOUS | Status: AC
  Administered 2015-12-11: 2 g via INTRAVENOUS
  Filled 2015-12-11: qty 50

## 2015-12-11 MED ORDER — BOOST / RESOURCE BREEZE PO LIQD
1.0000 | Freq: Three times a day (TID) | ORAL | Status: DC
  Administered 2015-12-11 – 2015-12-12 (×2): 1 via ORAL

## 2015-12-11 MED ORDER — POTASSIUM CHLORIDE CRYS ER 10 MEQ PO TBCR
10.0000 meq | EXTENDED_RELEASE_TABLET | Freq: Two times a day (BID) | ORAL | Status: DC
  Administered 2015-12-11 – 2015-12-12 (×3): 10 meq via ORAL
  Filled 2015-12-11 (×3): qty 1

## 2015-12-11 NOTE — Plan of Care (Addendum)
Problem: Nutrition: Goal: Adequate nutrition will be maintained Outcome: Progressing Able to tol liquids more.  Cont to have discomfort when swallowing/ nausea better .pt to have picc line placed this pm. For poss  Home tpn and poss  ivfs

## 2015-12-11 NOTE — Progress Notes (Signed)
[facility] [place] NAME: Kent [name]    MR#:  [id number]  DATE OF BIRTH:  Jun 17, 1955  SUBJECTIVE:  CHIEF COMPLAINT:   Chief Complaint  Patient presents with  . Emesis   - Patient with head and neck cancer, recurrent admissions for hyponatremia and nausea, vomiting. -nausea improving, tolerating small portions of diet - PICC line today for IV fluids after discharge  REVIEW OF SYSTEMS:  Review of Systems  Constitutional: Negative for chills and fever.  HENT: Positive for sore throat. Negative for ear discharge, ear pain and tinnitus.   Eyes: Negative for blurred vision and double vision.  Respiratory: Negative for cough, shortness of breath and wheezing.   Cardiovascular: Negative for chest pain and palpitations.  Gastrointestinal: Positive for nausea and vomiting. Negative for abdominal pain, constipation and diarrhea.  Genitourinary: Negative for dysuria.  Musculoskeletal: Negative for myalgias.  Neurological: Negative for dizziness, sensory change, speech change, focal weakness, seizures and headaches.  Psychiatric/Behavioral: Negative for depression.    DRUG ALLERGIES:  No Known Allergies  VITALS:  Blood pressure 101/70, pulse 63, temperature 97.7 F (36.5 C), temperature source Oral, resp. rate 16, height '5\' 7"'$  (1.702 m), weight 80.4 kg (177 lb 3 oz), SpO2 100 %.  PHYSICAL EXAMINATION:  Physical Exam  GENERAL:  60 y.o.-year-old patient Sitting in the bed with no acute distress.  EYES: Pupils equal, round, reactive to light and accommodation. No scleral icterus. Extraocular muscles intact.  HEENT: Head atraumatic, normocephalic. Oropharynx and nasopharynx clear.  NECK:  Supple, no jugular venous distention. No thyroid enlargement, no tenderness.  LUNGS: Normal breath sounds bilaterally, no wheezing, rales,rhonchi or crepitation. No use of accessory muscles of respiration. Decreased bibasilar breath sounds CARDIOVASCULAR:  S1, S2 normal. No murmurs, rubs, or gallops.  ABDOMEN: Soft, nontender, nondistended. Bowel sounds present. No organomegaly or mass.  EXTREMITIES: No pedal edema, cyanosis, or clubbing.  NEUROLOGIC: Cranial nerves II through XII are intact. Muscle strength 5/5 in all extremities. Sensation intact. Gait not checked.  PSYCHIATRIC: The patient is alert and oriented x 3.  SKIN: No obvious rash, lesion, or ulcer.    LABORATORY PANEL:   CBC  Recent Labs Lab 12/10/15 0513  WBC 10.5  HGB 11.4*  HCT 31.5*  PLT 210   ------------------------------------------------------------------------------------------------------------------  Chemistries   Recent Labs Lab 12/09/15 2316  12/11/15 0501  NA 127*  < > 130*  K 3.2*  < > 3.7  CL 91*  < > 96*  CO2 27  < > 29  GLUCOSE 146*  < > 114*  BUN 18  < > 10  CREATININE 0.82  < > 0.81  CALCIUM 8.3*  < > 7.8*  MG  --   < > 1.6*  AST 24  --   --   ALT 47  --   --   ALKPHOS 80  --   --   BILITOT 1.1  --   --   < > = values in this interval not displayed. ------------------------------------------------------------------------------------------------------------------  Cardiac Enzymes  Recent Labs Lab 12/09/15 2316  TROPONINI <0.03   ------------------------------------------------------------------------------------------------------------------  RADIOLOGY:  Dg Chest Port 1 View  Result Date: 12/10/2015 CLINICAL DATA:  Salivary gland cancer. Chemotherapy. Vomiting and coughing. EXAM: PORTABLE CHEST 1 VIEW COMPARISON:  Chest radiograph 02/14/2013 FINDINGS: Lungs are well inflated. Cardiomediastinal contours are normal. No pleural effusion or pneumothorax. No focal airspace consolidation or pulmonary edema. IMPRESSION: Clear lungs. Electronically Signed   By: Cletus Gash.D.  On: 12/10/2015 02:15    EKG:   Orders placed or performed during the hospital encounter of 12/09/15  . ED EKG  . ED EKG    ASSESSMENT AND PLAN:    60 year old male with past medical history significant for head and neck cancer status post chemoradiation, hypertension and diabetes presents to the hospital secondary to nausea and vomiting  #1 nausea and vomiting- likely from chemo and odynophagia from radiation trts - improving - zofran, phenergan IV prn - IV fluids - Due to frequent nausea and vomiting, electrolyte abnormalities, oncology has discussed possible PICC line placement for additional TPN.   #2 Hyponatremia- has chronic hyponatremia and recurrent. hypovolemic hyponatremia. From decreased oral intake. -IV fluids. 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 - fluids and salt tabs - PICC line for outpatient fluids at cancer center  #3 hypokalemia and hypomagnesemia- being replaced  #4 diabetes mellitus- only on SSI now as poor PO intake Monitor sugars  #5 head and neck cancer-following up with Dr. Mike Gip. Just finished chemotherapy. Still has few more radiation treatments to go. -Mucosal ulcerations from the radiation. Magic mouth wash - has decadron orally. - mgmt per oncology - PICC line for fluids, electrolytes at home to avoid recurrent hospitalisations  #6 DVT Prophylaxis- on lovenox    All the records are reviewed and case discussed with Care Management/Social Workerr. Management plans discussed with the patient, family and they are in agreement.  CODE STATUS: Full code  TOTAL TIME TAKING CARE OF THIS PATIENT: 37 minutes.   POSSIBLE D/C TOMORROW, DEPENDING ON CLINICAL CONDITION.   Tesslyn Baumert M.D on 12/11/2015 at 1:04 PM  Between 7am to 6pm - Pager - [phone]  After 6pm go to [email or web address] - password [email or web address] [facility]  Office  [phone]  CC: Primary care physician; Kathrine Haddock, NP

## 2015-12-11 NOTE — Progress Notes (Addendum)
Initial Nutrition Assessment  DOCUMENTATION CODES:   Severe malnutrition in context of acute illness/injury  INTERVENTION:  -Recommend liberalizing diet to regular secondary to pt meeting criteria for severe malnutrition -Recommend Boost Breeze po TID, each supplement provides 250 kcal and 9 grams of protein. Pt does not like ensure drinks. Pt receiving magic cup as well on all trays. -Pt better candidate for PEG placement vs TPN as risk of TPN far outweigh benefit with functioning gut.      NUTRITION DIAGNOSIS:   Malnutrition related to acute illness, cancer and cancer related treatments as evidenced by percent weight loss, energy intake < or equal to 50% for > or equal to 5 days.    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  Noted per MD note pt has required IV fluids regularly in clinic and if does not receive IV fluids in clinic deteriorates secondary to minimal oral intake.  Pt also with history of HTN and DM  Pt reports decreased intake since starting chemotherapy in August. Poor po intake requiring IV fluid in clinic. This am ate 100% eggs, 1/2 toast, most of magic cup, few bites of oatmeal.  Pt does not like ensure.  Reports has blisters on the back of the throat and has difficulty swallowing (reports it burns) Noted consideration being given to TPN.   Medications reviewed: decadron, aspart, Mag sulfate, KCL Labs reviewed: Na 130, glucose 114  Nutrition-Focused physical exam completed. Findings are mild in upper arm fat depletion, moderate in patella and  thigh muscle depletion, and none edema.     Diet Order:  Diet heart healthy/carb modified Room service appropriate? Yes; Fluid consistency: Thin  Skin:  Reviewed, no issues  Last BM:  10/8  Height:   Ht Readings from Last 1 Encounters:  12/10/15 '5\' 7"'$  (1.702 m)    Weight: Noted wt on 10/2 wt of 185 lb 3 oz, wt on 10/9 177 lb 3 oz (4% wt loss in the last week)  Wt Readings from Last 1 Encounters:  12/10/15 177 lb 3 oz (80.4 kg)    Ideal Body Weight:     BMI:  Body mass index is 27.75 kg/m.  Estimated Nutritional Needs:   Kcal:  1700-2000 kcals/d  Protein:  80-96 g/d  Fluid:  >/= 1758m/d  EDUCATION NEEDS:   Education needs addressed  Keniya Schlotterbeck B. AZenia [place] LPinal(pager) Weekend/On-Call pager ([phone]

## 2015-12-11 NOTE — Progress Notes (Addendum)
MEDICATION RELATED CONSULT NOTE - INITIAL   Pharmacy Consult for electrolyte monitoring Indication: hypomagnesemia, hypokalemia  No Known Allergies  Patient Measurements: Height: '5\' 7"'$  (170.2 cm) Weight: 177 lb 3 oz (80.4 kg) IBW/kg (Calculated) : 66.1  Vital Signs: Temp: 97.7 F (36.5 C) (10/10 0427) Temp Source: Oral (10/10 0427) BP: 101/70 (10/10 0427) Pulse Rate: 63 (10/10 0427) Intake/Output from previous day: 10/09 0701 - 10/10 0700 In: 1900 [P.O.:600; I.V.:1200; IV Piggyback:100] Out: 1750 [Urine:1400; Emesis/NG output:350] Intake/Output from this shift: No intake/output data recorded.  Labs:  Recent Labs  12/09/15 2316 12/10/15 0513 12/11/15 0501  WBC 14.6* 10.5  --   HGB 12.8* 11.4*  --   HCT 34.7* 31.5*  --   PLT 262 210  --   CREATININE 0.82 0.72 0.81  MG  --  1.2* 1.6*  ALBUMIN 3.6  --   --   PROT 6.5  --   --   AST 24  --   --   ALT 47  --   --   ALKPHOS 80  --   --   BILITOT 1.1  --   --    Estimated Creatinine Clearance: 97.3 mL/min (by C-G formula based on SCr of 0.81 mg/dL).   Medical History: Past Medical History:  Diagnosis Date  . Arthritis   . Diabetes mellitus without complication ([place])   . Hyperlipidemia   . Hypertension   . Obesity   . Stomach cancer ([place])    had tumor removed  . Tongue cancer [facility])    Assessment: Pharmacy consulted to monitor and replace electrolytes in this 60 year old male with hypomagnesemia and hypokalemia.   K = 3.7 Mg = 1.6  Goal of Therapy:  Electrolytes within normal limits  Plan:  Order magnesium 2 g IV once. No additional supplementation needed at this time Will recheck electrolytes with AM labs tomorrow and then every other day once WNL.  Lenis Noon, PharmD, BCPS Clinical Pharmacist 12/11/2015,7:55 AM  Addendum: Will resume patients home dose of KCl 10 mEq PO BID  Darylene Price [facility] 12/11/15 8:02 AM

## 2015-12-11 NOTE — Evaluation (Signed)
Physical Therapy Evaluation Patient Details Name: Kent [name] MRN: [id number] DOB: 1955-04-23 Today's Date: 12/11/2015   History of Present Illness  Pt is a 60 y/o male admitted for intractable nausea and vomiting. Pt currently recieving radiation treatments for Stage IV B squamous cell carcinoma. Pt was admitted overnight 12/07/15 for acute renal insufficiency, hyponatremia, and hypovalemia. PMH of arthritis, DM, HLD, HTN, stomach CA (with tumor removal) and tongue CA.  Clinical Impression  Pt lives in a one story home with his wife and son that has a level entry to enter/exit. Pt independent with mobility, ADL's, works a physically demanding job, and drives himself at baseline. At this time pt AROM/strenght is WFL's and he is independent with bed mobility. Pt is min guard for transfers and ambulation for 295 ft without use of an AD. Pt able to tolerate moderate balance challenges and ambulate up and down 6 stairs without railings without any LOB. Pt does not require any further PT services and is safe to return home.     Follow Up Recommendations No PT follow up    Equipment Recommendations  None recommended by PT    Recommendations for Other Services       Precautions / Restrictions Restrictions Weight Bearing Restrictions: No      Mobility  Bed Mobility Overal bed mobility: Independent             General bed mobility comments: Pt able to quickly and safely complete bed mobility without vc's or bedrail use  Transfers Overall transfer level: Needs assistance Equipment used: None Transfers: Sit to/from Stand Sit to Stand: Min guard         General transfer comment: min guard for safety only, no LOB   Ambulation/Gait Ambulation/Gait assistance: Min guard Ambulation Distance (Feet): 295 Feet Assistive device: None Gait Pattern/deviations: WFL(Within Functional Limits)   Gait velocity interpretation: at or above normal speed for age/gender General Gait Details:  No LOB; min guard for safety only  Stairs Stairs: Yes Stairs assistance: Min guard Stair Management: No rails Number of Stairs: 6 General stair comments: reciprical step up stairs, step to down stairs, no LOB or safety concerns  Wheelchair Mobility    Modified Rankin (Stroke Patients Only)       Balance Overall balance assessment: Needs assistance Sitting-balance support: Feet supported Sitting balance-Leahy Scale: Good Sitting balance - Comments: No LOB or concerns noted     Standing balance-Leahy Scale: Good Standing balance comment: DGI completed scored 23/24 indicating safe ambulator/minimal to no risk for falls. 1 point deduction due to step to pattern down the steps though no LOB or safety conerns noted                             Pertinent Vitals/Pain Pain Assessment: 0-10 Pain Score: 7  Pain Location: Throat Pain Descriptors / Indicators: Sore (blisters from radiation) Pain Intervention(s): Monitored during session;Premedicated before session  Pt O2 98% or greater throughout session on room air. Pt HR between 66 bpm and 74 bpm throughout session.     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); pt drives self     Hand Dominance   Dominant Hand: Left    Extremity/Trunk Assessment   Upper Extremity Assessment: Overall WFL for tasks assessed (at least 3/5; no AROM deficits noted)           Lower Extremity Assessment: Overall WFL for tasks assessed (at least 3/5 for all, no knee  buckling B, no AROM deficits noted)         Communication   Communication: No difficulties  Cognition Arousal/Alertness: Awake/alert Behavior During Therapy: WFL for tasks assessed/performed Overall Cognitive Status: Within Functional Limits for tasks assessed                      General Comments General comments (skin integrity, edema, etc.): Pt is agreeable to PT session.     Exercises     Assessment/Plan    PT Assessment Patent does not need any further PT services  PT Problem List            PT Treatment Interventions      PT Goals (Current goals can be found in the Care Plan section)  Acute Rehab PT Goals Patient Stated Goal: To go home. PT Goal Formulation: With patient Time For Goal Achievement: 12/25/15 Potential to Achieve Goals: Good    Frequency     Barriers to discharge        Co-evaluation               End of Session Equipment Utilized During Treatment: Gait belt Activity Tolerance: Patient tolerated treatment well Patient left: in chair;with call bell/phone within reach (RN consulted and agreed with not using chair alarm (fall risk score of 9)) Nurse Communication: Mobility status         Time: 5300-5110 PT Time Calculation (min) (ACUTE ONLY): 13 min   Charges:         PT G Codes:        Mana Morison, SPT 12/11/2015, 11:03 AM

## 2015-12-11 NOTE — Progress Notes (Signed)
Ch was making his rounds and visited pt in room 122. Provided the ministry of prayer and spiritual support. Mat Yvonnie Schinke    12/11/15 1150  Clinical Encounter Type  Visited With Patient  Visit Type Follow-up;Spiritual support  Referral From Nurse  Spiritual Encounters  Spiritual Needs Prayer

## 2015-12-11 NOTE — Consult Note (Signed)
[facility]  Date of admission:  12/09/2015  Inpatient day:  12/10/2015  Consulting physician: Dr. Lance Coon   Reason for Consultation:  Nausea and vomiting, bandemia on differential- infection?  Chief Complaint: Kent [name] is a 60 y.o. 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  HPI:  The patient was admitted overnight on 12/07/2015 with acute renal insufficiency and hyponatremic hypovolemia secondary to poor oral intake.  He has mucosal ulcerations from radiation.  At time of discharge, he appeared to be drinking and eating better.  Sodium was 132.  His wife states that after discharge he went to his room at home and went to bed.  He has had minimal oral intake.  He then began to have nausea and vomiting.  He denies any fever.  Labs were notable for a hematocrit of 34.7, hemoglobin 12.8, platelets 262,000, WBC 14,600 with an ANC of 13,100.  Sodium was 127 with a potassium of 3.2.  CBC today reveals a WBC of 10,500.  Sodium is 129.  Symptomatically, he feels much better since reinitiation of IVF.  He is able to eat a little.  He denies any nausea or vomiting.   Past Medical History:  Diagnosis Date  . Arthritis   . Diabetes mellitus without complication ([place])   . Hyperlipidemia   . Hypertension   . Obesity   . Stomach cancer ([place])    had tumor removed  . Tongue cancer [facility])     Past Surgical History:  Procedure Laterality Date  . gastric tumor removed  2010    Family History  Problem Relation Age of Onset  . Heart disease Mother   . Hypertension Father   . Cancer Sister     stomach  . Heart disease Brother     MI  . Cancer Daughter   . Cancer Son     testicular  . ADD / ADHD Sister     Social History:  reports that he has never smoked. He has never used  smokeless tobacco. He reports that he does not drink alcohol or use drugs.  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  The patient is alone today.  Allergies: No Known Allergies  Medications Prior to Admission  Medication Sig Dispense Refill  . atorvastatin (LIPITOR) 20 MG tablet Take 1 tablet (20 mg total) by mouth daily. Reported on 04/06/2015 90 tablet 1  . cyclobenzaprine (FLEXERIL) 10 MG tablet Take 1 tablet (10 mg total) by mouth at bedtime. 30 tablet 2  . dexamethasone (DECADRON) 2 MG tablet Take 1 tablet (2 mg total) by mouth 2 (two) times daily. 40 tablet 0  . Insulin Glargine (LANTUS) 100 UNIT/ML Solostar Pen Inject 36 Units into the skin daily at 10 pm. 4 pen 3  . lisinopril (PRINIVIL,ZESTRIL) 20 MG tablet Take 1 tablet (20 mg total) by mouth daily. 30 tablet 5  . LORazepam (ATIVAN) 0.5 MG tablet Take 1 tablet (0.5 mg total) by mouth every 6 (six) hours as needed (Nausea or vomiting). 30 tablet 0  . magic mouthwash SOLN Take 5 mLs by mouth 4 (four) times daily as needed for mouth pain. 480 mL 1  . meloxicam (MOBIC) 15 MG tablet Take 1 tablet (15 mg total) by mouth daily. 30 tablet 0  .  menthol-cetylpyridinium (CEPACOL) 3 MG lozenge Take 1 lozenge (3 mg total) by mouth as needed for sore throat. 100 tablet 12  . metFORMIN (GLUCOPHAGE) 1000 MG tablet Take 1 tablet (1,000 mg total) by mouth 2 (two) times daily with a meal. 180 tablet 3  . ondansetron (ZOFRAN) 8 MG tablet Take 1 tablet (8 mg total) by mouth 2 (two) times daily as needed. Start on the third day after chemotherapy. 30 tablet 1  . oxyCODONE-acetaminophen (PERCOCET/ROXICET) 5-325 MG tablet Take 1 tablet by mouth every 6 (six) hours as needed for severe pain. 40 tablet 0  . potassium chloride (K-DUR) 10 MEQ tablet Take 1 tablet (10 mEq total) by mouth 2 (two) times daily. 20 tablet 0  . prochlorperazine (COMPAZINE) 10 MG tablet Take 1 tablet (10 mg total) by mouth every 6 (six) hours as needed (Nausea or  vomiting). 30 tablet 1  . sildenafil (VIAGRA) 50 MG tablet Take 50 mg by mouth daily as needed for erectile dysfunction.    . sucralfate (CARAFATE) 1 g tablet Take 1 tablet (1 g total) by mouth 3 (three) times daily. Dissolve in 2 to 3 tbs warm water, swish &swallow 90 tablet 3  . blood glucose meter kit and supplies KIT Dispense based on patient and insurance preference. Use up to four times daily as directed. (FOR ICD-9 250.00, 250.01). 1 each 12  . UNIFINE PENTIPS 31G X 6 MM MISC 4 (four) times daily.      Review of Systems: GENERAL:  Feels "a little better".  No fevers or sweats. PERFORMANCE STATUS (ECOG):  1 HEENT:  No visual changes.  Sore throat.  No runny nose or mouth sores. Lungs: No shortness of breath or cough.  No hemoptysis. Cardiac:  No chest pain, palpitations, orthopnea, or PND.  GI:  Appetite fair.  Nausea and vomiting improved.  No diarrhea, constipation, melena or hematochezia.  No prior colonoscopy. GU:  No urgency, frequency, dysuria, or hematuria.  Musculoskeletal:  No back pain.  Knee aches.  No muscle tenderness. Extremities:  No pain or swelling. Skin:  No rashes or skin changes. Neuro:  No headache, numbness or weakness, balance or coordination issues. Endocrine:  Diabetes.  No thyroid issues, hot flashes or night sweats. Psych:  No mood changes, depression or anxiety. Pain:  Throat pain. Review of systems:  All other systems reviewed and found to be negative.  Physical Exam:  Blood pressure (!) 134/93, pulse 95, temperature 97.6 F (36.4 C), temperature source Oral, resp. rate 16, height 5' 7"  (1.702 m), weight 177 lb 3 oz (80.4 kg), SpO2 97 %.  GENERAL:  Chronically fatigued appearing gentleman sitting comfortably on the medical unit eating ice cream in no acute distress. MENTAL STATUS:  Alert and oriented to person, place and time. HEAD:  Slightly disheveled short gray hair.  Goatee.  Normocephalic, atraumatic, face symmetric, no Cushingoid features. EYES:   Blue eyes.  Pupils equal round and reactive to light and accomodation.  No conjunctivitis or scleral icterus. ENT:  Posterior pharynx with minimal mucositis and resolved thrush.  Tongue normal.  Mucous membranes moist.  RESPIRATORY:  Clear to auscultation without rales, wheezes or rhonchi. CARDIOVASCULAR:  Regular rate and rhythm without murmur, rub or gallop. ABDOMEN:  Soft, non-tender, with active bowel sounds, and no hepatosplenomegaly.  No masses. SKIN:  Left neck hyperpigmentation s/p radiation.  No rashes, ulcers or lesions. EXTREMITIES: No edema, no skin discoloration or tenderness.  No palpable cords. LYMPH NODES:  Minimal left neck adenopathy.  No palpable supraclavicular, axillary or inguinal adenopathy  NEUROLOGICAL: Unremarkable. PSYCH:  Appropriate.    Results for orders placed or performed during the hospital encounter of 12/09/15 (from the past 48 hour(s))  Comprehensive metabolic panel     Status: Abnormal   Collection Time: 12/09/15 11:16 PM  Result Value Ref Range   Sodium 127 (L) 135 - 145 mmol/L   Potassium 3.2 (L) 3.5 - 5.1 mmol/L   Chloride 91 (L) 101 - 111 mmol/L   CO2 27 22 - 32 mmol/L   Glucose, Bld 146 (H) 65 - 99 mg/dL   BUN 18 6 - 20 mg/dL   Creatinine, Ser 0.82 0.61 - 1.24 mg/dL   Calcium 8.3 (L) 8.9 - 10.3 mg/dL   Total Protein 6.5 6.5 - 8.1 g/dL   Albumin 3.6 3.5 - 5.0 g/dL   AST 24 15 - 41 U/L   ALT 47 17 - 63 U/L   Alkaline Phosphatase 80 38 - 126 U/L   Total Bilirubin 1.1 0.3 - 1.2 mg/dL   GFR calc non Af Amer >60 >60 mL/min   GFR calc Af Amer >60 >60 mL/min    Comment: (NOTE) The eGFR has been calculated using the CKD EPI equation. This calculation has not been validated in all clinical situations. eGFR's persistently <60 mL/min signify possible Chronic Kidney Disease.    Anion gap 9 5 - 15  Troponin I     Status: None   Collection Time: 12/09/15 11:16 PM  Result Value Ref Range   Troponin I <0.03 <0.03 ng/mL  CBC with Differential      Status: Abnormal   Collection Time: 12/09/15 11:16 PM  Result Value Ref Range   WBC 14.6 (H) 3.8 - 10.6 K/uL   RBC 4.25 (L) 4.40 - 5.90 MIL/uL   Hemoglobin 12.8 (L) 13.0 - 18.0 g/dL    Comment: RESULT REPEATED AND VERIFIED   HCT 34.7 (L) 40.0 - 52.0 %    Comment: RESULT REPEATED AND VERIFIED   MCV 81.8 80.0 - 100.0 fL   MCH 30.1 26.0 - 34.0 pg   MCHC 36.8 (H) 32.0 - 36.0 g/dL   RDW 15.6 (H) 11.5 - 14.5 %   Platelets 262 150 - 440 K/uL   Neutrophils Relative % 90 %   Neutro Abs 13.1 (H) 1.4 - 6.5 K/uL   Lymphocytes Relative 3 %   Lymphs Abs 0.4 (L) 1.0 - 3.6 K/uL   Monocytes Relative 7 %   Monocytes Absolute 0.9 0.2 - 1.0 K/uL   Eosinophils Relative 0 %   Eosinophils Absolute 0.0 0 - 0.7 K/uL   Basophils Relative 0 %   Basophils Absolute 0.0 0 - 0.1 K/uL  Culture, blood (Routine X 2) w Reflex to ID Panel     Status: None (Preliminary result)   Collection Time: 12/10/15  1:39 AM  Result Value Ref Range   Specimen Description BLOOD RIGHT ANTECUBITAL    Special Requests BOTTLES DRAWN AEROBIC AND ANAEROBIC 10CC    Culture NO GROWTH < 12 HOURS    Report Status PENDING   Culture, blood (Routine X 2) w Reflex to ID Panel     Status: None (Preliminary result)   Collection Time: 12/10/15  1:39 AM  Result Value Ref Range   Specimen Description BLOOD LEFT ANTECUBITAL    Special Requests BOTTLES DRAWN AEROBIC AND ANAEROBIC [place]    Culture NO GROWTH < 12 HOURS    Report Status PENDING   Basic metabolic panel  Status: Abnormal   Collection Time: 12/10/15  5:13 AM  Result Value Ref Range   Sodium 129 (L) 135 - 145 mmol/L   Potassium 3.2 (L) 3.5 - 5.1 mmol/L   Chloride 94 (L) 101 - 111 mmol/L   CO2 29 22 - 32 mmol/L   Glucose, Bld 115 (H) 65 - 99 mg/dL   BUN 15 6 - 20 mg/dL   Creatinine, Ser 0.72 0.61 - 1.24 mg/dL   Calcium 7.8 (L) 8.9 - 10.3 mg/dL   GFR calc non Af Amer >60 >60 mL/min   GFR calc Af Amer >60 >60 mL/min    Comment: (NOTE) The eGFR has been calculated using the CKD  EPI equation. This calculation has not been validated in all clinical situations. eGFR's persistently <60 mL/min signify possible Chronic Kidney Disease.    Anion gap 6 5 - 15  CBC     Status: Abnormal   Collection Time: 12/10/15  5:13 AM  Result Value Ref Range   WBC 10.5 3.8 - 10.6 K/uL   RBC 3.80 (L) 4.40 - 5.90 MIL/uL   Hemoglobin 11.4 (L) 13.0 - 18.0 g/dL   HCT 31.5 (L) 40.0 - 52.0 %   MCV 82.9 80.0 - 100.0 fL   MCH 29.9 26.0 - 34.0 pg   MCHC 36.1 (H) 32.0 - 36.0 g/dL   RDW 15.8 (H) 11.5 - 14.5 %   Platelets 210 150 - 440 K/uL  Magnesium     Status: Abnormal   Collection Time: 12/10/15  5:13 AM  Result Value Ref Range   Magnesium 1.2 (L) 1.7 - 2.4 mg/dL  Glucose, capillary     Status: Abnormal   Collection Time: 12/10/15  7:39 AM  Result Value Ref Range   Glucose-Capillary 111 (H) 65 - 99 mg/dL  Glucose, capillary     Status: Abnormal   Collection Time: 12/10/15 11:51 AM  Result Value Ref Range   Glucose-Capillary 130 (H) 65 - 99 mg/dL  Glucose, capillary     Status: Abnormal   Collection Time: 12/10/15  4:30 PM  Result Value Ref Range   Glucose-Capillary 140 (H) 65 - 99 mg/dL  Glucose, capillary     Status: Abnormal   Collection Time: 12/10/15  9:33 PM  Result Value Ref Range   Glucose-Capillary 124 (H) 65 - 99 mg/dL   Dg Chest Port 1 View  Result Date: 12/10/2015 CLINICAL DATA:  Salivary gland cancer. Chemotherapy. Vomiting and coughing. EXAM: PORTABLE CHEST 1 VIEW COMPARISON:  Chest radiograph 02/14/2013 FINDINGS: Lungs are well inflated. Cardiomediastinal contours are normal. No pleural effusion or pneumothorax. No focal airspace consolidation or pulmonary edema. IMPRESSION: Clear lungs. Electronically Signed   By: Ulyses Jarred M.D.   On: 12/10/2015 02:15    Assessment:  The patient is a 60 y.o.  gentleman with clinical stage IVB (T2N3M0) base of tongue squamous cell carcinoma currently day 8 s/p cycle #3 high dose cisplatin with concurrent radiation.  He was  admitted with recurrent dehydration and hyponatremia secondary to poor oral intake.  He has required IVF regularly in clinic.  When he does not receive fluids, he deteriorates secondary to his minimal oral intake.  He had a mildy elevated WBC on admission likely reactive in nature.    Plan:   1.  Oncology:  Day 8 s/p last cycle of chemotherapy.  Patient has 5 remaining fractions of radiation.  He has demonstrated several times in clinic and with admissions to the hospital, his inability to maintain hydration.  Patient does not have a central line.  Discussed PICC line with patient for IVF in outpatient department (clinic or at home with home health).  Patient in agreement.  Counts adequate.  2. Renal:  Patient improved dramatically with IVF.  Sodium improved.  Creatinine improved back to baseline.  3.  Infectious disease:  Afebrile.  Suspect initial elevated WBC reactive (stress due to nausea and vomiting and steroids).  CXR negative.  Cultures negative to date.  4.  Disposition:  Anticipate discharge home soon if able to maintain hydration (oral or IV).  Thank you for allowing me to participate in Limon 's care.  I will follow him closely with you while hospitalized and after discharge in the outpatient department.   Lequita Asal, MD  12/10/2015

## 2015-12-12 ENCOUNTER — Telehealth: Payer: Self-pay | Admitting: *Deleted

## 2015-12-12 ENCOUNTER — Ambulatory Visit
Admit: 2015-12-12 | Discharge: 2015-12-12 | Disposition: A | Discharge status: Home / Self Care | Payer: Managed Care, Other (non HMO) | Attending: Radiation Oncology | Admitting: Radiation Oncology

## 2015-12-12 ENCOUNTER — Ambulatory Visit: Payer: Managed Care, Other (non HMO)

## 2015-12-12 DIAGNOSIS — C01 Malignant neoplasm of base of tongue: Secondary | ICD-10-CM | POA: Diagnosis not present

## 2015-12-12 LAB — GLUCOSE, CAPILLARY
GLUCOSE-CAPILLARY: 96 mg/dL (ref 65–99)
Glucose-Capillary: 184 mg/dL — ABNORMAL HIGH (ref 65–99)

## 2015-12-12 LAB — BASIC METABOLIC PANEL
Anion gap: 7 (ref 5–15)
BUN: 9 mg/dL (ref 6–20)
CALCIUM: 8.1 mg/dL — AB (ref 8.9–10.3)
CO2: 28 mmol/L (ref 22–32)
CREATININE: 0.75 mg/dL (ref 0.61–1.24)
Chloride: 97 mmol/L — ABNORMAL LOW (ref 101–111)
GFR calc Af Amer: >60 mL/min (ref >60)
Glucose, Bld: 102 mg/dL — ABNORMAL HIGH (ref 65–99)
Potassium: 4 mmol/L (ref 3.5–5.1)
SODIUM: 132 mmol/L — AB (ref 135–145)

## 2015-12-12 LAB — MAGNESIUM: MAGNESIUM: 1.3 mg/dL — AB (ref 1.7–2.4)

## 2015-12-12 LAB — PHOSPHORUS: PHOSPHORUS: 2.7 mg/dL (ref 2.5–4.6)

## 2015-12-12 MED ORDER — MAGNESIUM OXIDE 400 (241.3 MG) MG PO TABS: 400.0000 mg | ORAL_TABLET | ORAL | 2 refills | Status: DC

## 2015-12-12 MED ORDER — SODIUM CHLORIDE 1 G PO TABS: 1.0000 g | ORAL_TABLET | Freq: Two times a day (BID) | ORAL | 0 refills | Status: DC

## 2015-12-12 MED ORDER — BOOST / RESOURCE BREEZE PO LIQD: 1.0000 | Freq: Three times a day (TID) | ORAL | 2 refills | Status: DC

## 2015-12-12 MED ORDER — MAGNESIUM SULFATE 4 GM/100ML IV SOLN
4.0000 g | Freq: Once | INTRAVENOUS | Status: AC
  Administered 2015-12-12: 4 g via INTRAVENOUS
  Filled 2015-12-12: qty 100

## 2015-12-12 MED ORDER — MAGNESIUM OXIDE 400 (241.3 MG) MG PO TABS: 400.0000 mg | ORAL_TABLET | ORAL | Status: DC

## 2015-12-12 NOTE — Progress Notes (Signed)
MEDICATION RELATED CONSULT NOTE - INITIAL   Pharmacy Consult for electrolyte monitoring Indication: hypomagnesemia, hypokalemia  No Known Allergies  Patient Measurements: Height: '5\' 7"'$  (170.2 cm) Weight: 177 lb 3 oz (80.4 kg) IBW/kg (Calculated) : 66.1  Vital Signs: Temp: 98.4 F (36.9 C) (10/11 0527) Temp Source: Oral (10/11 0527) BP: 131/82 (10/11 0527) Pulse Rate: 67 (10/11 0527) Intake/Output from previous day: 10/10 0701 - 10/11 0700 In: 720 [P.O.:720] Out: 200 [Urine:200] Intake/Output from this shift: No intake/output data recorded.  Labs:  Recent Labs  12/09/15 2316 12/10/15 0513 12/11/15 0501 12/12/15 0539  WBC 14.6* 10.5  --   --   HGB 12.8* 11.4*  --   --   HCT 34.7* 31.5*  --   --   PLT 262 210  --   --   CREATININE 0.82 0.72 0.81 0.75  MG  --  1.2* 1.6* 1.3*  PHOS  --   --   --  2.7  ALBUMIN 3.6  --   --   --   PROT 6.5  --   --   --   AST 24  --   --   --   ALT 47  --   --   --   ALKPHOS 80  --   --   --   BILITOT 1.1  --   --   --    Estimated Creatinine Clearance: 98.5 mL/min (by C-G formula based on SCr of 0.75 mg/dL).   Medical History: Past Medical History:  Diagnosis Date  . Arthritis   . Diabetes mellitus without complication ([place])   . Hyperlipidemia   . Hypertension   . Obesity   . Stomach cancer ([facility])    had tumor removed  . Tongue cancer [facility])    Assessment: Pharmacy consulted to monitor and replace electrolytes in this 60 year old male with hypomagnesemia and hypokalemia.   K = 4.0 (home dose of KCl 10 mEq BID was resumed) Mg = 1.3, remains low despite replacement  Goal of Therapy:  Electrolytes within normal limits  Plan:  Order magnesium 4 g IV once and will start mag-ox 400 mg PO daily. Home dose of KCl 10 mEq PO BID was resumed on 10/10.  No additional supplementation needed at this time. Will recheck electrolytes with AM labs tomorrow and then every other day once WNL.  Lenis Noon, PharmD, BCPS Clinical  Pharmacist 12/12/2015,7:30 AM

## 2015-12-12 NOTE — Telephone Encounter (Signed)
Called patient's wife to inform her that patient will need IVF's tomorrow and Friday.  He can go to infusion waiting after his radiation appointment both days.  Verbalized understanding.

## 2015-12-12 NOTE — Progress Notes (Signed)
MD making rounds. Order received to discharge from [facility] after radiation treatment. Discharged with RUA single lumen PICC per order. Prescriptions E-Scribed to pharmacy. Discharge paperwork provided, explained, signed and witnessed. No unanswered questions. Belongings sent with patient. Discharged via wheelchair by orderly to [place].

## 2015-12-12 NOTE — Discharge Summary (Signed)
[facility] NAME: Kent [name]    MR#:  [id number]  DATE OF BIRTH:  02/03/1956  DATE OF ADMISSION:  12/09/2015   ADMITTING PHYSICIAN: Lance Coon, MD  DATE OF DISCHARGE: 12/12/2015  1:30 PM  PRIMARY CARE PHYSICIAN: Kathrine Haddock, NP   ADMISSION DIAGNOSIS:   Cough [R05] Intractable vomiting with nausea, unspecified vomiting type [R11.2]  DISCHARGE DIAGNOSIS:   Principal Problem:   Intractable nausea and vomiting Active Problems:   Hypertension   Poorly controlled type 2 diabetes mellitus ([place])   Hyperlipidemia   Carcinoma of base of tongue (HCC)   Protein-calorie malnutrition, severe   Electrolyte imbalance   SECONDARY DIAGNOSIS:   Past Medical History:  Diagnosis Date  . Arthritis   . Diabetes mellitus without complication ([place])   . Hyperlipidemia   . Hypertension   . Obesity   . Stomach cancer ([place])    had tumor removed  . Tongue cancer [facility])     HOSPITAL COURSE:   60 year old male with past medical history significant for head and neck cancer status post chemoradiation, hypertension and diabetes presents to the hospital secondary to nausea and vomiting  #1 Nausea and vomiting- likely from chemo and odynophagia from radiation trts - improved with IV fluids - IV fluids daily after radiation treatments for now- PICC line placed for the same - no acute indication for TPN right away, may be needed as outpatient - zofran,prn - Due to frequent nausea and vomiting, electrolyte abnormalities, oncology has recommended PICC line placement for outpatient fluids- placed.   #2 Hyponatremia- has chronic hyponatremia and recurrent. hypovolemic hyponatremia. From decreased oral intake. -Improved now. 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 -salt tabs - PICC line for outpatient fluids at cancer center  #3 hypokalemia and hypomagnesemia- replaced  #4  diabetes mellitus- poor PO intake, stop lantus at discharge, continue metformin  #5 head and neck cancer-following up with Dr. Mike Gip. Just finished chemotherapy. Still has few more radiation treatments to go. - Mucosal ulcerations from the radiation. Magic mouth wash - has decadron orally. - mgmt per oncology - PICC line for fluids, electrolytes as outpatient  to avoid recurrent hospitalizations  Discharge today.  DISCHARGE CONDITIONS:   Guarded  CONSULTS OBTAINED:   Treatment Team:  Lequita Asal, MD  DRUG ALLERGIES:   No Known Allergies DISCHARGE MEDICATIONS:     Medication List    STOP taking these medications   Insulin Glargine 100 UNIT/ML Solostar Pen Commonly known as:  LANTUS   sildenafil 50 MG tablet Commonly known as:  VIAGRA     TAKE these medications   atorvastatin 20 MG tablet Commonly known as:  LIPITOR Take 1 tablet (20 mg total) by mouth daily. Reported on 04/06/2015   blood glucose meter kit and supplies Kit Dispense based on patient and insurance preference. Use up to four times daily as directed. (FOR ICD-9 250.00, 250.01).   cyclobenzaprine 10 MG tablet Commonly known as:  FLEXERIL Take 1 tablet (10 mg total) by mouth at bedtime.   dexamethasone 2 MG tablet Commonly known as:  DECADRON Take 1 tablet (2 mg total) by mouth 2 (two) times daily.   feeding supplement Liqd Take 1 Container by mouth 3 (three) times daily between meals.   lisinopril 20 MG tablet Commonly known as:  PRINIVIL,ZESTRIL Take 1 tablet (20 mg total) by mouth daily.   LORazepam 0.5 MG tablet  Commonly known as:  ATIVAN Take 1 tablet (0.5 mg total) by mouth every 6 (six) hours as needed (Nausea or vomiting).   magic mouthwash Soln Take 5 mLs by mouth 4 (four) times daily as needed for mouth pain.   magnesium oxide 400 (241.3 Mg) MG tablet Commonly known as:  MAG-OX Take 1 tablet (400 mg total) by mouth daily.   meloxicam 15 MG tablet Commonly known as:   MOBIC Take 1 tablet (15 mg total) by mouth daily.   menthol-cetylpyridinium 3 MG lozenge Commonly known as:  CEPACOL Take 1 lozenge (3 mg total) by mouth as needed for sore throat.   metFORMIN 1000 MG tablet Commonly known as:  GLUCOPHAGE Take 1 tablet (1,000 mg total) by mouth 2 (two) times daily with a meal.   ondansetron 8 MG tablet Commonly known as:  ZOFRAN Take 1 tablet (8 mg total) by mouth 2 (two) times daily as needed. Start on the third day after chemotherapy.   oxyCODONE-acetaminophen 5-325 MG tablet Commonly known as:  PERCOCET/ROXICET Take 1 tablet by mouth every 6 (six) hours as needed for severe pain.   potassium chloride 10 MEQ tablet Commonly known as:  K-DUR Take 1 tablet (10 mEq total) by mouth 2 (two) times daily.   prochlorperazine 10 MG tablet Commonly known as:  COMPAZINE Take 1 tablet (10 mg total) by mouth every 6 (six) hours as needed (Nausea or vomiting).   sodium chloride 1 g tablet Take 1 tablet (1 g total) by mouth 2 (two) times daily with a meal.   sucralfate 1 g tablet Commonly known as:  CARAFATE Take 1 tablet (1 g total) by mouth 3 (three) times daily. Dissolve in 2 to 3 tbs warm water, swish &swallow   UNIFINE PENTIPS 31G X 6 MM Misc Generic drug:  Insulin Pen Needle 4 (four) times daily.        DISCHARGE INSTRUCTIONS:   1. PCP f/u in 2 weeks 2. Oncology f/u qdaily for IV fluids- recommend normal saline with KCl supplements and magnesium supplements infuse over 2-3 hours.  DIET:   Regular diet- soft consistency with dietary supplements as recommended  ACTIVITY:   Activity as tolerated  OXYGEN:   Home Oxygen: No.  Oxygen Delivery: room air  DISCHARGE LOCATION:   home   If you experience worsening of your admission symptoms, develop shortness of breath, life threatening emergency, suicidal or homicidal thoughts you must seek medical attention immediately by calling 911 or calling your MD immediately  if symptoms less  severe.  You Must read complete instructions/literature along with all the possible adverse reactions/side effects for all the Medicines you take and that have been prescribed to you. Take any new Medicines after you have completely understood and accpet all the possible adverse reactions/side effects.   Please note  You were cared for by a hospitalist during your hospital stay. If you have any questions about your discharge medications or the care you received while you were in the hospital after you are discharged, you can call the unit and asked to speak with the hospitalist on call if the hospitalist that took care of you is not available. Once you are discharged, your primary care physician will handle any further medical issues. Please note that NO REFILLS for any discharge medications will be authorized once you are discharged, as it is imperative that you return to your primary care physician (or establish a relationship with a primary care physician if you do not have one)  for your aftercare needs so that they can reassess your need for medications and monitor your lab values.    On the day of Discharge:  VITAL SIGNS:   Blood pressure 129/89, pulse 83, temperature 97.6 F (36.4 C), temperature source Oral, resp. rate 18, height 5' 7"  (1.702 m), weight 80.4 kg (177 lb 3 oz), SpO2 100 %.  PHYSICAL EXAMINATION:   GENERAL:  60 y.o.-year-old patient Sitting in the bed with no acute distress.  EYES: Pupils equal, round, reactive to light and accommodation. No scleral icterus. Extraocular muscles intact.  HEENT: Head atraumatic, normocephalic. Oropharynx and nasopharynx clear.  NECK:  Supple, no jugular venous distention. No thyroid enlargement, no tenderness.  LUNGS: Normal breath sounds bilaterally, no wheezing, rales,rhonchi or crepitation. No use of accessory muscles of respiration. Decreased bibasilar breath sounds CARDIOVASCULAR: S1, S2 normal. No murmurs, rubs, or gallops.  ABDOMEN:  Soft, nontender, nondistended. Bowel sounds present. No organomegaly or mass.  EXTREMITIES: No pedal edema, cyanosis, or clubbing.  NEUROLOGIC: Cranial nerves II through XII are intact. Muscle strength 5/5 in all extremities. Sensation intact. Gait not checked.  PSYCHIATRIC: The patient is alert and oriented x 3.  SKIN: No obvious rash, lesion, or ulcer.    DATA REVIEW:   CBC  Recent Labs Lab 12/10/15 0513  WBC 10.5  HGB 11.4*  HCT 31.5*  PLT 210    Chemistries   Recent Labs Lab 12/09/15 2316  12/12/15 0539  NA 127*  < > 132*  K 3.2*  < > 4.0  CL 91*  < > 97*  CO2 27  < > 28  GLUCOSE 146*  < > 102*  BUN 18  < > 9  CREATININE 0.82  < > 0.75  CALCIUM 8.3*  < > 8.1*  MG  --   < > 1.3*  AST 24  --   --   ALT 47  --   --   ALKPHOS 80  --   --   BILITOT 1.1  --   --   < > = values in this interval not displayed.   Microbiology Results  Results for orders placed or performed during the hospital encounter of 12/09/15  Culture, blood (Routine X 2) w Reflex to ID Panel     Status: None (Preliminary result)   Collection Time: 12/10/15  1:39 AM  Result Value Ref Range Status   Specimen Description BLOOD RIGHT ANTECUBITAL  Final   Special Requests BOTTLES DRAWN AEROBIC AND ANAEROBIC 10CC  Final   Culture NO GROWTH 2 DAYS  Final   Report Status PENDING  Incomplete  Culture, blood (Routine X 2) w Reflex to ID Panel     Status: None (Preliminary result)   Collection Time: 12/10/15  1:39 AM  Result Value Ref Range Status   Specimen Description BLOOD LEFT ANTECUBITAL  Final   Special Requests BOTTLES DRAWN AEROBIC AND ANAEROBIC [place]  Final   Culture NO GROWTH 2 DAYS  Final   Report Status PENDING  Incomplete    RADIOLOGY:  Dg Chest Port 1 View  Result Date: 12/11/2015 CLINICAL DATA:  PICC placement. EXAM: PORTABLE CHEST 1 VIEW COMPARISON:  12/10/2015 FINDINGS: Tip of the right upper extremity PICC in the mid distal SVC. No pneumothorax. Lungs are clear. Unchanged  cardiomediastinal contours. No pleural fluid or focal airspace disease. IMPRESSION: Tip of the right upper extremity PICC in the mid distal SVC. Electronically Signed   By: Jeb Levering M.D.   On: 12/11/2015 21:11  Management plans discussed with the patient, family and they are in agreement.  CODE STATUS:     Code Status Orders        Start     Ordered   12/10/15 0144  Full code  Continuous     12/10/15 0143    Code Status History    Date Active Date Inactive Code Status Order ID Comments User Context   12/07/2015  5:13 PM 12/08/2015  5:39 PM Full Code [id number]  Hillary Bow, MD Inpatient   11/15/2015 12:19 PM 11/16/2015  8:00 PM Full Code [id number]  Dustin Flock, MD Inpatient      TOTAL TIME TAKING CARE OF THIS PATIENT: 37 minutes.    Gladstone Lighter M.D on 12/12/2015 at 2:47 PM  Between 7am to 6pm - Pager - [phone]  After 6pm go to [email or web address] - Proofreader  [facility]  Office  [phone]  CC: Primary care physician; Kathrine Haddock, NP   Note: This dictation was prepared with Dragon dictation along with smaller phrase technology. Any transcriptional errors that result from this process are unintentional.

## 2015-12-13 ENCOUNTER — Ambulatory Visit
Admission: RE | Admit: 2015-12-13 | Discharge: 2015-12-13 | Disposition: A | Discharge status: Home / Self Care | Payer: Managed Care, Other (non HMO) | Source: Ambulatory Visit | Attending: Radiation Oncology | Admitting: Radiation Oncology

## 2015-12-13 ENCOUNTER — Other Ambulatory Visit: Payer: Self-pay | Admitting: Hematology and Oncology

## 2015-12-13 ENCOUNTER — Other Ambulatory Visit: Payer: Self-pay | Admitting: *Deleted

## 2015-12-13 ENCOUNTER — Telehealth: Payer: Self-pay

## 2015-12-13 ENCOUNTER — Inpatient Hospital Stay: Payer: Managed Care, Other (non HMO)

## 2015-12-13 DIAGNOSIS — I10 Essential (primary) hypertension: Secondary | ICD-10-CM | POA: Diagnosis not present

## 2015-12-13 DIAGNOSIS — R630 Anorexia: Secondary | ICD-10-CM | POA: Diagnosis not present

## 2015-12-13 DIAGNOSIS — Z794 Long term (current) use of insulin: Secondary | ICD-10-CM | POA: Diagnosis not present

## 2015-12-13 DIAGNOSIS — Z809 Family history of malignant neoplasm, unspecified: Secondary | ICD-10-CM | POA: Diagnosis not present

## 2015-12-13 DIAGNOSIS — J029 Acute pharyngitis, unspecified: Secondary | ICD-10-CM | POA: Diagnosis not present

## 2015-12-13 DIAGNOSIS — E876 Hypokalemia: Secondary | ICD-10-CM | POA: Diagnosis not present

## 2015-12-13 DIAGNOSIS — Z7984 Long term (current) use of oral hypoglycemic drugs: Secondary | ICD-10-CM | POA: Diagnosis not present

## 2015-12-13 DIAGNOSIS — Z8043 Family history of malignant neoplasm of testis: Secondary | ICD-10-CM | POA: Diagnosis not present

## 2015-12-13 DIAGNOSIS — E119 Type 2 diabetes mellitus without complications: Secondary | ICD-10-CM | POA: Diagnosis not present

## 2015-12-13 DIAGNOSIS — Z800 Family history of malignant neoplasm of digestive organs: Secondary | ICD-10-CM | POA: Diagnosis not present

## 2015-12-13 DIAGNOSIS — Z510 Encounter for antineoplastic radiation therapy: Secondary | ICD-10-CM | POA: Diagnosis not present

## 2015-12-13 DIAGNOSIS — M129 Arthropathy, unspecified: Secondary | ICD-10-CM | POA: Diagnosis not present

## 2015-12-13 DIAGNOSIS — C01 Malignant neoplasm of base of tongue: Secondary | ICD-10-CM | POA: Diagnosis not present

## 2015-12-13 DIAGNOSIS — Z85028 Personal history of other malignant neoplasm of stomach: Secondary | ICD-10-CM | POA: Diagnosis not present

## 2015-12-13 DIAGNOSIS — Z79899 Other long term (current) drug therapy: Secondary | ICD-10-CM | POA: Diagnosis not present

## 2015-12-13 DIAGNOSIS — E669 Obesity, unspecified: Secondary | ICD-10-CM | POA: Diagnosis not present

## 2015-12-13 DIAGNOSIS — E785 Hyperlipidemia, unspecified: Secondary | ICD-10-CM | POA: Diagnosis not present

## 2015-12-13 DIAGNOSIS — D696 Thrombocytopenia, unspecified: Secondary | ICD-10-CM | POA: Diagnosis not present

## 2015-12-13 DIAGNOSIS — R531 Weakness: Secondary | ICD-10-CM | POA: Diagnosis not present

## 2015-12-13 DIAGNOSIS — Z5111 Encounter for antineoplastic chemotherapy: Secondary | ICD-10-CM | POA: Diagnosis not present

## 2015-12-13 DIAGNOSIS — Y842 Radiological procedure and radiotherapy as the cause of abnormal reaction of the patient, or of later complication, without mention of misadventure at the time of the procedure: Secondary | ICD-10-CM | POA: Diagnosis not present

## 2015-12-13 DIAGNOSIS — K1231 Oral mucositis (ulcerative) due to antineoplastic therapy: Secondary | ICD-10-CM | POA: Diagnosis not present

## 2015-12-13 DIAGNOSIS — E871 Hypo-osmolality and hyponatremia: Secondary | ICD-10-CM | POA: Diagnosis not present

## 2015-12-13 DIAGNOSIS — B370 Candidal stomatitis: Secondary | ICD-10-CM | POA: Diagnosis not present

## 2015-12-13 DIAGNOSIS — R112 Nausea with vomiting, unspecified: Secondary | ICD-10-CM | POA: Diagnosis not present

## 2015-12-13 MED ORDER — SODIUM CHLORIDE 0.9 % IJ SOLN
10.0000 mL | INTRAMUSCULAR | Status: DC | PRN
  Administered 2015-12-13 (×2): 10 mL
  Filled 2015-12-13: qty 10

## 2015-12-13 MED ORDER — SODIUM CHLORIDE 0.9 % IV SOLN
Freq: Once | INTRAVENOUS | Status: AC
  Administered 2015-12-13: 09:00:00 via INTRAVENOUS
  Filled 2015-12-13: qty 1000

## 2015-12-13 MED ORDER — MAGNESIUM SULFATE 2 GM/50ML IV SOLN
2.0000 g | Freq: Once | INTRAVENOUS | Status: AC
  Administered 2015-12-13: 2 g via INTRAVENOUS
  Filled 2015-12-13: qty 50

## 2015-12-13 MED ORDER — HEPARIN SOD (PORK) LOCK FLUSH 100 UNIT/ML IV SOLN
250.0000 [IU] | Freq: Once | INTRAVENOUS | Status: AC | PRN
Start: 2015-12-13 — End: 2015-12-13
  Administered 2015-12-13: 250 [IU]
  Filled 2015-12-13 (×2): qty 5

## 2015-12-13 NOTE — Telephone Encounter (Signed)
Received a hospital discharge notification for this patient so I called and scheduled his hospital f/up for 12/24/15.

## 2015-12-14 ENCOUNTER — Ambulatory Visit
Admission: RE | Admit: 2015-12-14 | Discharge: 2015-12-14 | Disposition: A | Discharge status: Home / Self Care | Payer: Managed Care, Other (non HMO) | Source: Ambulatory Visit | Attending: Radiation Oncology | Admitting: Radiation Oncology

## 2015-12-14 ENCOUNTER — Inpatient Hospital Stay: Payer: Managed Care, Other (non HMO)

## 2015-12-14 ENCOUNTER — Ambulatory Visit: Payer: Managed Care, Other (non HMO)

## 2015-12-14 VITALS — BP 110/78 | HR 80 | Temp 97.1°F | Resp 18

## 2015-12-14 DIAGNOSIS — C01 Malignant neoplasm of base of tongue: Secondary | ICD-10-CM | POA: Diagnosis not present

## 2015-12-14 DIAGNOSIS — E871 Hypo-osmolality and hyponatremia: Secondary | ICD-10-CM

## 2015-12-14 LAB — CBC WITH DIFFERENTIAL/PLATELET
Basophils Absolute: 0 10*3/uL (ref 0–0.1)
Basophils Relative: 0 %
Eosinophils Absolute: 0 10*3/uL (ref 0–0.7)
Eosinophils Relative: 0 %
HCT: 29.8 % — ABNORMAL LOW (ref 39.0–52.0)
Hemoglobin: 11 g/dL — ABNORMAL LOW (ref 13.0–17.0)
Lymphocytes Relative: 2 %
Lymphs Abs: 0.3 10*3/uL — ABNORMAL LOW (ref 1.0–3.6)
MCH: 29.8 pg (ref 26.0–34.0)
MCHC: 36.7 g/dL — ABNORMAL HIGH (ref 32.0–36.0)
MCV: 81.1 fL (ref 80.0–100.0)
Monocytes Absolute: 1.1 10*3/uL — ABNORMAL HIGH (ref 0.2–1.0)
Monocytes Relative: 9 %
Neutro Abs: 11.1 10*3/uL — ABNORMAL HIGH (ref 1.4–6.5)
Neutrophils Relative %: 89 %
Platelets: 176 10*3/uL (ref 150–440)
RBC: 3.68 MIL/uL — ABNORMAL LOW (ref 4.40–5.90)
RDW: 16.4 % — ABNORMAL HIGH (ref 11.5–14.5)
WBC: 12.5 10*3/uL — ABNORMAL HIGH (ref 3.8–10.6)

## 2015-12-14 LAB — BASIC METABOLIC PANEL
Anion gap: 8 (ref 5–15)
BUN: 13 mg/dL (ref 6–20)
CO2: 26 mmol/L (ref 22–32)
Calcium: 8.6 mg/dL — ABNORMAL LOW (ref 8.9–10.3)
Chloride: 95 mmol/L — ABNORMAL LOW (ref 101–111)
Creatinine, Ser: 0.82 mg/dL (ref 0.61–1.24)
GFR calc Af Amer: >60 mL/min (ref >60)
GFR calc non Af Amer: >60 mL/min (ref >60)
Glucose, Bld: 157 mg/dL — ABNORMAL HIGH (ref 65–99)
Potassium: 3.7 mmol/L (ref 3.5–5.1)
Sodium: 129 mmol/L — ABNORMAL LOW (ref 135–145)

## 2015-12-14 LAB — MAGNESIUM: Magnesium: 1.4 mg/dL — ABNORMAL LOW (ref 1.7–2.4)

## 2015-12-14 MED ORDER — SODIUM CHLORIDE 0.9 % IV SOLN
Freq: Once | INTRAVENOUS | Status: AC
  Administered 2015-12-14: 09:00:00 via INTRAVENOUS
  Filled 2015-12-14: qty 1000

## 2015-12-14 MED ORDER — MAGNESIUM SULFATE 2 GM/50ML IV SOLN
2.0000 g | Freq: Once | INTRAVENOUS | Status: AC
  Administered 2015-12-14: 2 g via INTRAVENOUS
  Filled 2015-12-14: qty 50

## 2015-12-14 MED ORDER — HEPARIN SOD (PORK) LOCK FLUSH 100 UNIT/ML IV SOLN
250.0000 [IU] | Freq: Once | INTRAVENOUS | Status: AC | PRN
  Administered 2015-12-14: 250 [IU]
  Filled 2015-12-14: qty 5

## 2015-12-14 MED ORDER — SODIUM CHLORIDE 0.9 % IJ SOLN
10.0000 mL | INTRAMUSCULAR | Status: DC | PRN
  Filled 2015-12-14: qty 10

## 2015-12-14 MED ORDER — SODIUM CHLORIDE 0.9 % IV SOLN
INTRAVENOUS | Status: DC
  Administered 2015-12-14: 10:00:00 via INTRAVENOUS
  Filled 2015-12-14: qty 1000

## 2015-12-15 LAB — CULTURE, BLOOD (ROUTINE X 2)
CULTURE: NO GROWTH
CULTURE: NO GROWTH

## 2015-12-17 ENCOUNTER — Inpatient Hospital Stay: Payer: Managed Care, Other (non HMO)

## 2015-12-17 ENCOUNTER — Other Ambulatory Visit: Payer: Self-pay | Admitting: *Deleted

## 2015-12-17 ENCOUNTER — Ambulatory Visit
Admission: RE | Admit: 2015-12-17 | Discharge: 2015-12-17 | Disposition: A | Discharge status: Home / Self Care | Payer: Self-pay | Source: Ambulatory Visit | Attending: Hematology and Oncology | Admitting: Hematology and Oncology

## 2015-12-17 ENCOUNTER — Other Ambulatory Visit: Payer: Self-pay | Admitting: Hematology and Oncology

## 2015-12-17 ENCOUNTER — Inpatient Hospital Stay (HOSPITAL_BASED_OUTPATIENT_CLINIC_OR_DEPARTMENT_OTHER): Payer: Managed Care, Other (non HMO) | Admitting: Hematology and Oncology

## 2015-12-17 ENCOUNTER — Ambulatory Visit
Admission: RE | Admit: 2015-12-17 | Discharge: 2015-12-17 | Disposition: A | Discharge status: Home / Self Care | Payer: Managed Care, Other (non HMO) | Source: Ambulatory Visit | Attending: Radiation Oncology | Admitting: Radiation Oncology

## 2015-12-17 ENCOUNTER — Ambulatory Visit: Payer: Managed Care, Other (non HMO)

## 2015-12-17 VITALS — BP 132/93 | HR 94 | Temp 97.9°F | Resp 18 | Wt 174.4 lb

## 2015-12-17 DIAGNOSIS — Y842 Radiological procedure and radiotherapy as the cause of abnormal reaction of the patient, or of later complication, without mention of misadventure at the time of the procedure: Secondary | ICD-10-CM

## 2015-12-17 DIAGNOSIS — K1233 Oral mucositis (ulcerative) due to radiation: Secondary | ICD-10-CM

## 2015-12-17 DIAGNOSIS — Z800 Family history of malignant neoplasm of digestive organs: Secondary | ICD-10-CM

## 2015-12-17 DIAGNOSIS — E785 Hyperlipidemia, unspecified: Secondary | ICD-10-CM

## 2015-12-17 DIAGNOSIS — Z79899 Other long term (current) drug therapy: Secondary | ICD-10-CM

## 2015-12-17 DIAGNOSIS — M129 Arthropathy, unspecified: Secondary | ICD-10-CM

## 2015-12-17 DIAGNOSIS — E871 Hypo-osmolality and hyponatremia: Secondary | ICD-10-CM

## 2015-12-17 DIAGNOSIS — R638 Other symptoms and signs concerning food and fluid intake: Secondary | ICD-10-CM

## 2015-12-17 DIAGNOSIS — R531 Weakness: Secondary | ICD-10-CM

## 2015-12-17 DIAGNOSIS — D696 Thrombocytopenia, unspecified: Secondary | ICD-10-CM

## 2015-12-17 DIAGNOSIS — I10 Essential (primary) hypertension: Secondary | ICD-10-CM

## 2015-12-17 DIAGNOSIS — IMO0002 Reserved for concepts with insufficient information to code with codable children: Secondary | ICD-10-CM

## 2015-12-17 DIAGNOSIS — C01 Malignant neoplasm of base of tongue: Secondary | ICD-10-CM

## 2015-12-17 DIAGNOSIS — Z85028 Personal history of other malignant neoplasm of stomach: Secondary | ICD-10-CM

## 2015-12-17 DIAGNOSIS — Z809 Family history of malignant neoplasm, unspecified: Secondary | ICD-10-CM

## 2015-12-17 DIAGNOSIS — E876 Hypokalemia: Secondary | ICD-10-CM

## 2015-12-17 DIAGNOSIS — R229 Localized swelling, mass and lump, unspecified: Principal | ICD-10-CM

## 2015-12-17 DIAGNOSIS — R112 Nausea with vomiting, unspecified: Secondary | ICD-10-CM

## 2015-12-17 DIAGNOSIS — K1231 Oral mucositis (ulcerative) due to antineoplastic therapy: Secondary | ICD-10-CM

## 2015-12-17 DIAGNOSIS — Z794 Long term (current) use of insulin: Secondary | ICD-10-CM

## 2015-12-17 DIAGNOSIS — Z8043 Family history of malignant neoplasm of testis: Secondary | ICD-10-CM

## 2015-12-17 DIAGNOSIS — J029 Acute pharyngitis, unspecified: Secondary | ICD-10-CM | POA: Diagnosis not present

## 2015-12-17 DIAGNOSIS — E669 Obesity, unspecified: Secondary | ICD-10-CM

## 2015-12-17 DIAGNOSIS — E119 Type 2 diabetes mellitus without complications: Secondary | ICD-10-CM

## 2015-12-17 DIAGNOSIS — Z7984 Long term (current) use of oral hypoglycemic drugs: Secondary | ICD-10-CM

## 2015-12-17 DIAGNOSIS — R630 Anorexia: Secondary | ICD-10-CM

## 2015-12-17 LAB — COMPREHENSIVE METABOLIC PANEL
ALT: 22 U/L (ref 17–63)
AST: 19 U/L (ref 15–41)
Albumin: 3.4 g/dL — ABNORMAL LOW (ref 3.5–5.0)
Alkaline Phosphatase: 93 U/L (ref 38–126)
Anion gap: 6 (ref 5–15)
BUN: 14 mg/dL (ref 6–20)
CO2: 30 mmol/L (ref 22–32)
Calcium: 8.4 mg/dL — ABNORMAL LOW (ref 8.9–10.3)
Chloride: 94 mmol/L — ABNORMAL LOW (ref 101–111)
Creatinine, Ser: 0.76 mg/dL (ref 0.61–1.24)
GFR calc Af Amer: >60 mL/min (ref >60)
GFR calc non Af Amer: >60 mL/min (ref >60)
Glucose, Bld: 193 mg/dL — ABNORMAL HIGH (ref 65–99)
Potassium: 3.3 mmol/L — ABNORMAL LOW (ref 3.5–5.1)
Sodium: 130 mmol/L — ABNORMAL LOW (ref 135–145)
Total Bilirubin: 0.6 mg/dL (ref 0.3–1.2)
Total Protein: 6.3 g/dL — ABNORMAL LOW (ref 6.5–8.1)

## 2015-12-17 LAB — CBC WITH DIFFERENTIAL/PLATELET
Basophils Absolute: 0 10*3/uL (ref 0–0.1)
Basophils Relative: 0 %
Eosinophils Absolute: 0 10*3/uL (ref 0–0.7)
Eosinophils Relative: 0 %
HCT: 29.5 % — ABNORMAL LOW (ref 40.0–52.0)
Hemoglobin: 10.8 g/dL — ABNORMAL LOW (ref 13.0–18.0)
Lymphocytes Relative: 2 %
Lymphs Abs: 0.2 10*3/uL — ABNORMAL LOW (ref 1.0–3.6)
MCH: 30.2 pg (ref 26.0–34.0)
MCHC: 36.5 g/dL — ABNORMAL HIGH (ref 32.0–36.0)
MCV: 82.7 fL (ref 80.0–100.0)
Monocytes Absolute: 1 10*3/uL (ref 0.2–1.0)
Monocytes Relative: 9 %
Neutro Abs: 9.6 10*3/uL — ABNORMAL HIGH (ref 1.4–6.5)
Neutrophils Relative %: 89 %
Platelets: 178 10*3/uL (ref 150–440)
RBC: 3.57 MIL/uL — ABNORMAL LOW (ref 4.40–5.90)
RDW: 17 % — ABNORMAL HIGH (ref 11.5–14.5)
WBC: 10.8 10*3/uL — ABNORMAL HIGH (ref 3.8–10.6)

## 2015-12-17 LAB — MAGNESIUM: Magnesium: 1.4 mg/dL — ABNORMAL LOW (ref 1.7–2.4)

## 2015-12-17 MED ORDER — HEPARIN SOD (PORK) LOCK FLUSH 100 UNIT/ML IV SOLN
250.0000 [IU] | INTRAVENOUS | Status: AC | PRN
Start: 2015-12-17 — End: 2015-12-17
  Administered 2015-12-17: 250 [IU]

## 2015-12-17 MED ORDER — SODIUM CHLORIDE 0.9% FLUSH
10.0000 mL | INTRAVENOUS | Status: DC | PRN
  Filled 2015-12-17: qty 10

## 2015-12-17 MED ORDER — SODIUM CHLORIDE 0.9 % IV SOLN
INTRAVENOUS | Status: DC
  Administered 2015-12-17: 15:00:00 via INTRAVENOUS
  Filled 2015-12-17: qty 500

## 2015-12-17 NOTE — Progress Notes (Signed)
Patient here today for IVF's.

## 2015-12-17 NOTE — Progress Notes (Signed)
Kent [name]:  12/17/15  Chief Complaint: Kent [name] is a 60 y.o. 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  HPI:  The patient was last seen in the medical oncology clinic on 12/04/2015.  At that time, decision was made to proceed with cycle #3 cisplatin.  Sodium was 132.  BUN was 11 and creatinine 0.84.  He received a prescription for fluconazole for his thrush.  He was to continue his oral potassium.  We discussed good oral hydration and caloric intake.  He did not receive IVF on 10/04 and 12/06/2015.  Labs on 12/07/2015 revealed a sodium of 126, BUN 32 and creatinine 1.32.  Oral intake was minimal.  He received IVF and 2 gm of magnesium sulfate in clinic.  He was admitted overnight on 12/08/2015 at [facility] for hydration and electrolyte replacement. Labs on 12/08/2015 revealed a sodium of 132, potassium 3.0, BUN 24 and creatinine 1.01.  He was readmitted to [facility] from 12/09/2015 - 12/12/2015 secondary to nausea, vomiting, and minimal oral intake.  He received IVF with electrolyte replacement and anti-emetics.  A PICC line was placed for continuation of fluids in the outpatient department.  Symptomatically, he continues to eat very little.  He had an Ensure this morning.  He tried to get in 3 Ensure a [name].  He is not drinking a lot of fluids.  He is voiding 2-3 times a [name].  He states that his throat hurts.  He has thick yellow secretions from his mouth.  He states that Friday, 12/21/2015 is his last [name] of radiation.   Past Medical History:  Diagnosis Date  . Arthritis   . Diabetes mellitus without complication ([place])   . Hyperlipidemia   . Hypertension   . Obesity   . Stomach cancer ([place])    had tumor removed  . Tongue cancer [facility])     Past Surgical History:  Procedure Laterality Date  . gastric tumor removed  2010     Family History  Problem Relation Age of Onset  . Heart disease Mother   . Hypertension Father   . Cancer Sister     stomach  . Heart disease Brother     MI  . Cancer Daughter   . Cancer Son     testicular  . ADD / ADHD Sister     Social History:  reports that he has never smoked. He has never used smokeless tobacco. He reports that he does not drink alcohol or use drugs.  He smokes a pipe and cigar rarely (once every 3 months).  He has a son and daughter.  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  He has planned (and paid for) a trip to the beach from 10/15/2015 - 10/20/2015.  He was no short term disability.  He has 10 weeks of vacation and sick leave.  The patient is alone today.  Allergies: No Known Allergies  Current Medications: Current Outpatient Prescriptions  Medication Sig Dispense Refill  . atorvastatin (LIPITOR) 20 MG tablet Take 1 tablet (20 mg total) by mouth daily. Reported on 04/06/2015 90 tablet 1  . blood glucose meter kit and supplies KIT Dispense based on patient and insurance preference. Use up to four times daily as directed. (FOR ICD-9 250.00, 250.01). 1 each 12  . cyclobenzaprine (FLEXERIL)  10 MG tablet Take 1 tablet (10 mg total) by mouth at bedtime. 30 tablet 2  . dexamethasone (DECADRON) 2 MG tablet Take 1 tablet (2 mg total) by mouth 2 (two) times daily. 40 tablet 0  . feeding supplement (BOOST / RESOURCE BREEZE) LIQD Take 1 Container by mouth 3 (three) times daily between meals. 20000 mL 2  . lisinopril (PRINIVIL,ZESTRIL) 20 MG tablet Take 1 tablet (20 mg total) by mouth daily. 30 tablet 5  . LORazepam (ATIVAN) 0.5 MG tablet Take 1 tablet (0.5 mg total) by mouth every 6 (six) hours as needed (Nausea or vomiting). 30 tablet 0  . magic mouthwash SOLN Take 5 mLs by mouth 4 (four) times daily as needed for mouth pain. 480 mL 1  . magnesium oxide (MAG-OX) 400 (241.3 Mg) MG tablet Take 1 tablet (400 mg total) by mouth daily. 60 tablet 2   . meloxicam (MOBIC) 15 MG tablet Take 1 tablet (15 mg total) by mouth daily. 30 tablet 0  . menthol-cetylpyridinium (CEPACOL) 3 MG lozenge Take 1 lozenge (3 mg total) by mouth as needed for sore throat. 100 tablet 12  . metFORMIN (GLUCOPHAGE) 1000 MG tablet Take 1 tablet (1,000 mg total) by mouth 2 (two) times daily with a meal. 180 tablet 3  . ondansetron (ZOFRAN) 8 MG tablet Take 1 tablet (8 mg total) by mouth 2 (two) times daily as needed. Start on the third [name] after chemotherapy. 30 tablet 1  . oxyCODONE-acetaminophen (PERCOCET/ROXICET) 5-325 MG tablet Take 1 tablet by mouth every 6 (six) hours as needed for severe pain. 40 tablet 0  . potassium chloride (K-DUR) 10 MEQ tablet Take 1 tablet (10 mEq total) by mouth 2 (two) times daily. 20 tablet 0  . prochlorperazine (COMPAZINE) 10 MG tablet Take 1 tablet (10 mg total) by mouth every 6 (six) hours as needed (Nausea or vomiting). 30 tablet 1  . sodium chloride 1 g tablet Take 1 tablet (1 g total) by mouth 2 (two) times daily with a meal. 30 tablet 0  . sucralfate (CARAFATE) 1 g tablet Take 1 tablet (1 g total) by mouth 3 (three) times daily. Dissolve in 2 to 3 tbs warm water, swish &swallow 90 tablet 3  . UNIFINE PENTIPS 31G X 6 MM MISC 4 (four) times daily.    . fluconazole (DIFLUCAN) 100 MG tablet     . hydrocortisone (CORTEF) 10 MG tablet      No current facility-administered medications for this visit.    Facility-Administered Medications Ordered in Other Visits  Medication Dose Route Frequency Provider Last Rate Last Dose  . heparin lock flush 100 unit/mL  250 Units Intracatheter PRN Lequita Asal, MD      . sodium chloride flush (NS) 0.9 % injection 10 mL  10 mL Intracatheter PRN Lequita Asal, MD        Review of Systems:  GENERAL:  Feels tired.  No fevers or sweats.  Weight down 12 pounds since 12/04/2015. PERFORMANCE STATUS (ECOG):  1 HEENT:  Vision little blurry secondary to diabetes.  Sore throat with thick  secretions.  No runny nose or mouth sores. Lungs:  No shortness of breath or cough.  No hemoptysis. Cardiac:  No chest pain, palpitations, orthopnea, or PND.  GI:  Poor oral intake.  No nausea, vomiting, diarrhea, constipation, melena or hematochezia.  No prior colonoscopy. GU:  Voids 2-3 times/[name].  No urgency, frequency, dysuria, or hematuria.  Musculoskeletal:  No back pain.  Knee aches.  No  muscle tenderness. Extremities:  No pain or swelling. Skin:  No rashes or skin changes. Neuro:  No headache, numbness or weakness, balance or coordination issues. Endocrine:  Diabetes.  No thyroid issues, hot flashes or night sweats. Psych:  No mood changes, depression or anxiety. Pain:  Throat pain. Review of systems:  All other systems reviewed and found to be negative.  Physical Exam: Blood pressure (!) 132/93, pulse 94, temperature 97.9 F (36.6 C), temperature source Tympanic, resp. rate 18, weight 174 lb 6.1 oz (79.1 kg). GENERAL:  Chronically fatigued appearing gentleman sitting comfortably in the exam room in no acute distress. MENTAL STATUS:  Alert and oriented to person, place and time. HEAD:  Short gray hair.  Goatee.  Normocephalic, atraumatic, face symmetric, no Cushingoid features. EYES:  Blue eyes.  Pupils equal round and reactive to light and accomodation.  No conjunctivitis or scleral icterus. ENT:  Oropharynx with thick posterior pharynx secretions.  No thrush.  Tongue normal.  NECK:  Hyperpigmentation left neck s/p radiation.   RESPIRATORY:  Clear to auscultation without rales, wheezes or rhonchi. CARDIOVASCULAR:  Regular rate and rhythm without murmur, rub or gallop. ABDOMEN:  Soft, non-tender, with active bowel sounds, and no hepatosplenomegaly.  No masses. SKIN:  No rashes, ulcers or lesions. EXTREMITIES:  Upper extremity PICC line.  No edema, no skin discoloration or tenderness.  No palpable cords. LYMPH NODES: Soft left neck adenopathy (2.5 x 3 cm).  No palpable  supraclavicular, axillary or inguinal adenopathy  NEUROLOGICAL: Unremarkable. PSYCH:  Appropriate.     Infusion on 12/17/2015  Component Date Value Ref Range Status  . WBC 12/17/2015 10.8* 3.8 - 10.6 K/uL Final  . RBC 12/17/2015 3.57* 4.40 - 5.90 MIL/uL Final  . Hemoglobin 12/17/2015 10.8* 13.0 - 18.0 g/dL Final  . HCT 12/17/2015 29.5* 40.0 - 52.0 % Final  . MCV 12/17/2015 82.7  80.0 - 100.0 fL Final  . MCH 12/17/2015 30.2  26.0 - 34.0 pg Final  . MCHC 12/17/2015 36.5* 32.0 - 36.0 g/dL Final  . RDW 12/17/2015 17.0* 11.5 - 14.5 % Final  . Platelets 12/17/2015 178  150 - 440 K/uL Final  . Neutrophils Relative % 12/17/2015 89  % Final  . Neutro Abs 12/17/2015 9.6* 1.4 - 6.5 K/uL Final  . Lymphocytes Relative 12/17/2015 2  % Final  . Lymphs Abs 12/17/2015 0.2* 1.0 - 3.6 K/uL Final  . Monocytes Relative 12/17/2015 9  % Final  . Monocytes Absolute 12/17/2015 1.0  0.2 - 1.0 K/uL Final  . Eosinophils Relative 12/17/2015 0  % Final  . Eosinophils Absolute 12/17/2015 0.0  0 - 0.7 K/uL Final  . Basophils Relative 12/17/2015 0  % Final  . Basophils Absolute 12/17/2015 0.0  0 - 0.1 K/uL Final  . Sodium 12/17/2015 130* 135 - 145 mmol/L Final  . Potassium 12/17/2015 3.3* 3.5 - 5.1 mmol/L Final  . Chloride 12/17/2015 94* 101 - 111 mmol/L Final  . CO2 12/17/2015 30  22 - 32 mmol/L Final  . Glucose, Bld 12/17/2015 193* 65 - 99 mg/dL Final  . BUN 12/17/2015 14  6 - 20 mg/dL Final  . Creatinine, Ser 12/17/2015 0.76  0.61 - 1.24 mg/dL Final  . Calcium 12/17/2015 8.4* 8.9 - 10.3 mg/dL Final  . Total Protein 12/17/2015 6.3* 6.5 - 8.1 g/dL Final  . Albumin 12/17/2015 3.4* 3.5 - 5.0 g/dL Final  . AST 12/17/2015 19  15 - 41 U/L Final  . ALT 12/17/2015 22  17 - 63 U/L Final  .  Alkaline Phosphatase 12/17/2015 93  38 - 126 U/L Final  . Total Bilirubin 12/17/2015 0.6  0.3 - 1.2 mg/dL Final  . GFR calc non Af Amer 12/17/2015 >60  >60 mL/min Final  . GFR calc Af Amer 12/17/2015 >60  >60 mL/min Final    Comment: (NOTE) The eGFR has been calculated using the CKD EPI equation. This calculation has not been validated in all clinical situations. eGFR's persistently <60 mL/min signify possible Chronic Kidney Disease.   . Anion gap 12/17/2015 6  5 - 15 Final  . Magnesium 12/17/2015 1.4* 1.7 - 2.4 mg/dL Final    Assessment:  Kent [name] is a 60 y.o. 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  Tumor was p16 IHC positive (high risk HPV).  CT soft tissue neck on 08/17/2015 revealed a slowly progressive over years, infiltrative, deep 4.1 x 5.4 cm LEFT parotid mass with widespread ipsilateral bulky adenopathy.  PET scan at [facility] on 09/17/2015 revealed left base of tongue malignancy corresponding to asymmetric enhancement identified on the neck CT with hypermetabolic extensive conglomerate left cervical adenopathy (level II-IV) extending from the level of the angle of the mandible down inferiorly to the level of the cricoid cartilage.  There was clustered subcentimeter left level IB submandibular nodes with mild FDG activity.  There was no evidence of metastatic disease.  Alpha gal IgE was 1.21 (< 0.35) c/w IgE antibodies to galactose-alpha-1,3 galactose (oligosaccharide part on Fab portion of the cetuximab heavy chain) and suggestive of potential cetuximab reaction.  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  Pathologic stage was T4NxM0.  He took imatinib for 1 year.  He is [name] 14 s/p 3 cycles of cisplatin (10/22/2015 - 12/04/2015) with concurrent radiation.  Left neck adenopathy has dramatically improved.  He has recurrent hyponatremia.  Sodium was 122 on 11/15/2015 prompting overnight admission for IVF (NS).  He has received daily IVF last week.   He was admitted to [facility] from 12/08/2015 - 12/09/2015 for hydration and  electrolyte replacement. Sodium was 126.  He was readmitted to [facility] from 12/09/2015 - 12/12/2015 secondary to nausea, vomiting, and minimal oral intake.  He received IVF with electrolyte replacement and anti-emetics.  A PICC line was placed for continuation of fluids in the outpatient department.  Symptomatically, he continues to eat very little.  He is not drinking a lot of fluids.  He is voiding 2-3 times a [name].  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  His last [name] of radiation is scheduled for 12/21/2015.  Plan: 1.  Labs today:  CBC with diff, CMP, Mg. 2.  IVF + Mg + KCl today. 3.  RTC tomorrow for 1 liter NS. 4.  RTC on 12/19/2015 for labs (BMP, Mg) + IVF +/- Mg. 5.  RTC on 12/20/2015 for 1 liter NS. 6.  RTC on 12/21/2015 for covering provider, labs (BMP, Mg) +/- IVF +/- Mg +/- K.   Lequita Asal, MD  12/17/2015, 2:54 PM

## 2015-12-18 ENCOUNTER — Other Ambulatory Visit: Payer: Self-pay | Admitting: *Deleted

## 2015-12-18 ENCOUNTER — Ambulatory Visit
Admission: RE | Admit: 2015-12-18 | Discharge: 2015-12-18 | Disposition: A | Discharge status: Home / Self Care | Payer: Managed Care, Other (non HMO) | Source: Ambulatory Visit | Attending: Radiation Oncology | Admitting: Radiation Oncology

## 2015-12-18 ENCOUNTER — Inpatient Hospital Stay: Payer: Managed Care, Other (non HMO)

## 2015-12-18 DIAGNOSIS — C01 Malignant neoplasm of base of tongue: Secondary | ICD-10-CM | POA: Diagnosis not present

## 2015-12-18 DIAGNOSIS — E871 Hypo-osmolality and hyponatremia: Secondary | ICD-10-CM

## 2015-12-18 MED ORDER — SODIUM CHLORIDE 0.9 % IJ SOLN
10.0000 mL | INTRAMUSCULAR | Status: DC | PRN
  Administered 2015-12-18: 10 mL
  Filled 2015-12-18: qty 10

## 2015-12-18 MED ORDER — HEPARIN SOD (PORK) LOCK FLUSH 100 UNIT/ML IV SOLN
250.0000 [IU] | Freq: Once | INTRAVENOUS | Status: AC | PRN
  Administered 2015-12-18: 250 [IU]
  Filled 2015-12-18: qty 5

## 2015-12-18 MED ORDER — OXYCODONE-ACETAMINOPHEN 5-325 MG PO TABS: 1.0000 | ORAL_TABLET | Freq: Four times a day (QID) | ORAL | 0 refills | Status: DC | PRN

## 2015-12-18 MED ORDER — SODIUM CHLORIDE 0.9 % IV SOLN
Freq: Once | INTRAVENOUS | Status: AC
  Administered 2015-12-18: 09:00:00 via INTRAVENOUS
  Filled 2015-12-18: qty 1000

## 2015-12-19 ENCOUNTER — Inpatient Hospital Stay: Payer: Managed Care, Other (non HMO)

## 2015-12-19 ENCOUNTER — Ambulatory Visit: Payer: Managed Care, Other (non HMO)

## 2015-12-19 ENCOUNTER — Ambulatory Visit
Admission: RE | Admit: 2015-12-19 | Discharge: 2015-12-19 | Disposition: A | Discharge status: Home / Self Care | Payer: Managed Care, Other (non HMO) | Source: Ambulatory Visit | Attending: Radiation Oncology | Admitting: Radiation Oncology

## 2015-12-19 DIAGNOSIS — C01 Malignant neoplasm of base of tongue: Secondary | ICD-10-CM | POA: Diagnosis not present

## 2015-12-19 DIAGNOSIS — E871 Hypo-osmolality and hyponatremia: Secondary | ICD-10-CM

## 2015-12-19 LAB — BASIC METABOLIC PANEL
Anion gap: 10 (ref 5–15)
BUN: 14 mg/dL (ref 6–20)
CO2: 26 mmol/L (ref 22–32)
Calcium: 8.5 mg/dL — ABNORMAL LOW (ref 8.9–10.3)
Chloride: 91 mmol/L — ABNORMAL LOW (ref 101–111)
Creatinine, Ser: 0.75 mg/dL (ref 0.61–1.24)
GFR calc Af Amer: >60 mL/min (ref >60)
GFR calc non Af Amer: >60 mL/min (ref >60)
Glucose, Bld: 141 mg/dL — ABNORMAL HIGH (ref 65–99)
Potassium: 3.6 mmol/L (ref 3.5–5.1)
Sodium: 127 mmol/L — ABNORMAL LOW (ref 135–145)

## 2015-12-19 LAB — MAGNESIUM: Magnesium: 1.4 mg/dL — ABNORMAL LOW (ref 1.7–2.4)

## 2015-12-19 MED ORDER — SODIUM CHLORIDE 0.9% FLUSH
10.0000 mL | INTRAVENOUS | Status: DC | PRN
  Filled 2015-12-19: qty 10

## 2015-12-19 MED ORDER — MAGNESIUM SULFATE 2 GM/50ML IV SOLN
2.0000 g | Freq: Once | INTRAVENOUS | Status: AC
  Administered 2015-12-19: 2 g via INTRAVENOUS
  Filled 2015-12-19: qty 50

## 2015-12-19 MED ORDER — HEPARIN SOD (PORK) LOCK FLUSH 100 UNIT/ML IV SOLN
250.0000 [IU] | INTRAVENOUS | Status: AC | PRN
  Administered 2015-12-19: 250 [IU]
  Filled 2015-12-19: qty 5

## 2015-12-19 MED ORDER — SODIUM CHLORIDE 0.9 % IV SOLN
Freq: Once | INTRAVENOUS | Status: AC
  Administered 2015-12-19: 10:00:00 via INTRAVENOUS
  Filled 2015-12-19: qty 1000

## 2015-12-20 ENCOUNTER — Ambulatory Visit
Admission: RE | Admit: 2015-12-20 | Discharge: 2015-12-20 | Disposition: A | Discharge status: Home / Self Care | Payer: Managed Care, Other (non HMO) | Source: Ambulatory Visit | Attending: Radiation Oncology | Admitting: Radiation Oncology

## 2015-12-20 ENCOUNTER — Inpatient Hospital Stay: Payer: Managed Care, Other (non HMO)

## 2015-12-20 DIAGNOSIS — C01 Malignant neoplasm of base of tongue: Secondary | ICD-10-CM | POA: Diagnosis not present

## 2015-12-20 MED ORDER — SODIUM CHLORIDE 0.9 % IV SOLN
Freq: Once | INTRAVENOUS | Status: AC
  Administered 2015-12-20: 09:00:00 via INTRAVENOUS
  Filled 2015-12-20: qty 1000

## 2015-12-20 MED ORDER — MORPHINE SULFATE (PF) 2 MG/ML IV SOLN
2.0000 mg | Freq: Once | INTRAVENOUS | Status: AC
  Administered 2015-12-20: 2 mg via INTRAVENOUS
  Filled 2015-12-20: qty 1

## 2015-12-20 MED ORDER — SODIUM CHLORIDE 0.9 % IJ SOLN
10.0000 mL | INTRAMUSCULAR | Status: DC | PRN
  Administered 2015-12-20: 10 mL
  Filled 2015-12-20: qty 10

## 2015-12-20 MED ORDER — MAGNESIUM SULFATE 2 GM/50ML IV SOLN
2.0000 g | Freq: Once | INTRAVENOUS | Status: AC
  Administered 2015-12-20: 2 g via INTRAVENOUS
  Filled 2015-12-20: qty 50

## 2015-12-20 MED ORDER — HEPARIN SOD (PORK) LOCK FLUSH 100 UNIT/ML IV SOLN
500.0000 [IU] | Freq: Once | INTRAVENOUS | Status: AC | PRN
  Administered 2015-12-20: 500 [IU]
  Filled 2015-12-20 (×2): qty 5

## 2015-12-21 ENCOUNTER — Ambulatory Visit: Payer: Managed Care, Other (non HMO)

## 2015-12-21 ENCOUNTER — Inpatient Hospital Stay (HOSPITAL_BASED_OUTPATIENT_CLINIC_OR_DEPARTMENT_OTHER): Payer: Managed Care, Other (non HMO) | Admitting: Internal Medicine

## 2015-12-21 ENCOUNTER — Inpatient Hospital Stay: Payer: Managed Care, Other (non HMO)

## 2015-12-21 ENCOUNTER — Encounter: Payer: Self-pay | Admitting: Hematology and Oncology

## 2015-12-21 VITALS — BP 134/90 | HR 102 | Temp 97.6°F | Resp 18 | Wt 172.0 lb

## 2015-12-21 DIAGNOSIS — R112 Nausea with vomiting, unspecified: Secondary | ICD-10-CM

## 2015-12-21 DIAGNOSIS — C01 Malignant neoplasm of base of tongue: Secondary | ICD-10-CM

## 2015-12-21 DIAGNOSIS — Z800 Family history of malignant neoplasm of digestive organs: Secondary | ICD-10-CM

## 2015-12-21 DIAGNOSIS — M129 Arthropathy, unspecified: Secondary | ICD-10-CM

## 2015-12-21 DIAGNOSIS — D696 Thrombocytopenia, unspecified: Secondary | ICD-10-CM | POA: Diagnosis not present

## 2015-12-21 DIAGNOSIS — E871 Hypo-osmolality and hyponatremia: Secondary | ICD-10-CM

## 2015-12-21 DIAGNOSIS — R630 Anorexia: Secondary | ICD-10-CM

## 2015-12-21 DIAGNOSIS — Z7984 Long term (current) use of oral hypoglycemic drugs: Secondary | ICD-10-CM

## 2015-12-21 DIAGNOSIS — E119 Type 2 diabetes mellitus without complications: Secondary | ICD-10-CM

## 2015-12-21 DIAGNOSIS — I10 Essential (primary) hypertension: Secondary | ICD-10-CM

## 2015-12-21 DIAGNOSIS — Z794 Long term (current) use of insulin: Secondary | ICD-10-CM

## 2015-12-21 DIAGNOSIS — B370 Candidal stomatitis: Secondary | ICD-10-CM

## 2015-12-21 DIAGNOSIS — E669 Obesity, unspecified: Secondary | ICD-10-CM

## 2015-12-21 DIAGNOSIS — J029 Acute pharyngitis, unspecified: Secondary | ICD-10-CM

## 2015-12-21 DIAGNOSIS — Z79899 Other long term (current) drug therapy: Secondary | ICD-10-CM

## 2015-12-21 DIAGNOSIS — E876 Hypokalemia: Secondary | ICD-10-CM

## 2015-12-21 DIAGNOSIS — Z809 Family history of malignant neoplasm, unspecified: Secondary | ICD-10-CM

## 2015-12-21 DIAGNOSIS — K1231 Oral mucositis (ulcerative) due to antineoplastic therapy: Secondary | ICD-10-CM

## 2015-12-21 DIAGNOSIS — E785 Hyperlipidemia, unspecified: Secondary | ICD-10-CM

## 2015-12-21 DIAGNOSIS — Z85028 Personal history of other malignant neoplasm of stomach: Secondary | ICD-10-CM

## 2015-12-21 DIAGNOSIS — Z8043 Family history of malignant neoplasm of testis: Secondary | ICD-10-CM

## 2015-12-21 DIAGNOSIS — R531 Weakness: Secondary | ICD-10-CM

## 2015-12-21 LAB — BASIC METABOLIC PANEL
Anion gap: 11 (ref 5–15)
BUN: 15 mg/dL (ref 6–20)
CO2: 26 mmol/L (ref 22–32)
Calcium: 8.5 mg/dL — ABNORMAL LOW (ref 8.9–10.3)
Chloride: 90 mmol/L — ABNORMAL LOW (ref 101–111)
Creatinine, Ser: 0.83 mg/dL (ref 0.61–1.24)
GFR calc Af Amer: >60 mL/min (ref >60)
GFR calc non Af Amer: >60 mL/min (ref >60)
Glucose, Bld: 172 mg/dL — ABNORMAL HIGH (ref 65–99)
Potassium: 3.2 mmol/L — ABNORMAL LOW (ref 3.5–5.1)
Sodium: 127 mmol/L — ABNORMAL LOW (ref 135–145)

## 2015-12-21 LAB — MAGNESIUM: Magnesium: 1.5 mg/dL — ABNORMAL LOW (ref 1.7–2.4)

## 2015-12-21 MED ORDER — POTASSIUM CHLORIDE 20 MEQ PO PACK: 40.0000 meq | PACK | Freq: Two times a day (BID) | ORAL | 0 refills | Status: DC

## 2015-12-21 MED ORDER — SODIUM CHLORIDE 0.9 % IV SOLN: Freq: Once | INTRAVENOUS | Status: DC

## 2015-12-21 MED ORDER — FLUCONAZOLE 100 MG PO TABS: 100.0000 mg | ORAL_TABLET | Freq: Every day | ORAL | 0 refills | Status: DC

## 2015-12-21 MED ORDER — HEPARIN SOD (PORK) LOCK FLUSH 100 UNIT/ML IV SOLN
500.0000 [IU] | Freq: Once | INTRAVENOUS | Status: AC | PRN
  Administered 2015-12-21: 500 [IU]

## 2015-12-21 MED ORDER — SODIUM CHLORIDE 0.9 % IV SOLN
Freq: Once | INTRAVENOUS | Status: AC
  Administered 2015-12-21: 11:00:00 via INTRAVENOUS
  Filled 2015-12-21: qty 1000

## 2015-12-21 MED ORDER — SODIUM CHLORIDE 0.9 % IJ SOLN
10.0000 mL | INTRAMUSCULAR | Status: DC | PRN
  Filled 2015-12-21: qty 10

## 2015-12-21 NOTE — Progress Notes (Signed)
Patient is here for follow up, he is doing ok he is feeling weak.

## 2015-12-23 NOTE — Progress Notes (Signed)
Kent [name] day:  12/24/15  Chief Complaint: Kent [name] is a 60 y.o. 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  HPI:  The patient was last seen in the medical oncology [name] on 12/17/2015.  At that time,  He was eating little.  He was not drinking a lot of fluids.  He was voiding 2-3 times a day.  He had mucositis from radiation.  His last day of radiation was 12/21/2015.    He received fluids daily in [name].  He received electrolytes (potassium and magnesium) if needed.  He states he ate some [place] soup yesterday. He is drinking a little water. He states he is spitting out his potassium. He has not picked up his powdered potassium.  He continues on his oral Decadron per Dr. Baruch Gouty.   Past Medical History:  Diagnosis Date  . Arthritis   . Diabetes mellitus without complication ([place])   . Hyperlipidemia   . Hypertension   . Obesity   . Stomach cancer ([place])    had tumor removed  . Tongue cancer [facility])     Past Surgical History:  Procedure Laterality Date  . gastric tumor removed  2010    Family History  Problem Relation Age of Onset  . Heart disease Mother   . Hypertension Father   . Cancer Sister     stomach  . Heart disease Brother     MI  . Cancer Daughter   . Cancer Son     testicular  . ADD / ADHD Sister     Social History:  reports that he has never smoked. He has never used smokeless tobacco. He reports that he does not drink alcohol or use drugs.  He smokes a pipe and cigar rarely (once every 3 months).  He has a son and daughter.  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  He has planned (and paid for) a trip to the beach from 10/15/2015 - 10/20/2015.  He was no short term disability.  He has 10 weeks of vacation and sick leave.  The patient is alone today.  Allergies:  No Known Allergies  Current Medications: Current Outpatient Prescriptions  Medication Sig Dispense Refill  . atorvastatin (LIPITOR) 20 MG tablet Take 1 tablet (20 mg total) by mouth daily. Reported on 04/06/2015 90 tablet 1  . blood glucose meter kit and supplies KIT Dispense based on patient and insurance preference. Use up to four times daily as directed. (FOR ICD-9 250.00, 250.01). 1 each 12  . cyclobenzaprine (FLEXERIL) 10 MG tablet Take 1 tablet (10 mg total) by mouth at bedtime. 30 tablet 2  . dexamethasone (DECADRON) 2 MG tablet Take 1 tablet (2 mg total) by mouth 2 (two) times daily. 40 tablet 0  . feeding supplement (BOOST / RESOURCE BREEZE) LIQD Take 1 Container by mouth 3 (three) times daily between meals. 20000 mL 2  . fluconazole (DIFLUCAN) 100 MG tablet Take 1 tablet (100 mg total) by mouth daily. 11 tablet 0  . hydrocortisone (CORTEF) 10 MG tablet     . lisinopril (PRINIVIL,ZESTRIL) 20 MG tablet Take 1 tablet (20 mg total) by mouth daily. 30 tablet 5  . LORazepam (ATIVAN) 0.5 MG tablet Take 1 tablet (0.5 mg total) by mouth every 6 (six) hours as needed (Nausea or vomiting).  30 tablet 0  . magic mouthwash SOLN Take 5 mLs by mouth 4 (four) times daily as needed for mouth pain. 480 mL 1  . magnesium oxide (MAG-OX) 400 (241.3 Mg) MG tablet Take 1 tablet (400 mg total) by mouth daily. 60 tablet 2  . meloxicam (MOBIC) 15 MG tablet Take 1 tablet (15 mg total) by mouth daily. 30 tablet 0  . menthol-cetylpyridinium (CEPACOL) 3 MG lozenge Take 1 lozenge (3 mg total) by mouth as needed for sore throat. 100 tablet 12  . metFORMIN (GLUCOPHAGE) 1000 MG tablet Take 1 tablet (1,000 mg total) by mouth 2 (two) times daily with a meal. 180 tablet 3  . ondansetron (ZOFRAN) 8 MG tablet Take 1 tablet (8 mg total) by mouth 2 (two) times daily as needed. Start on the third day after chemotherapy. 30 tablet 1  . oxyCODONE-acetaminophen (PERCOCET/ROXICET) 5-325 MG tablet Take 1 tablet by mouth every 6 (six)  hours as needed for severe pain. 40 tablet 0  . potassium chloride (KLOR-CON) 20 MEQ packet Take 40 mEq by mouth 2 (two) times daily. 60 packet 0  . prochlorperazine (COMPAZINE) 10 MG tablet Take 1 tablet (10 mg total) by mouth every 6 (six) hours as needed (Nausea or vomiting). 30 tablet 1  . sodium chloride 1 g tablet Take 1 tablet (1 g total) by mouth 2 (two) times daily with a meal. 30 tablet 0  . sucralfate (CARAFATE) 1 g tablet Take 1 tablet (1 g total) by mouth 3 (three) times daily. Dissolve in 2 to 3 tbs warm water, swish &swallow 90 tablet 3  . UNIFINE PENTIPS 31G X 6 MM MISC 4 (four) times daily.     No current facility-administered medications for this visit.     Review of Systems:  GENERAL:  Tired.  No fevers or sweats.  Weight down 20 pounds since 12/04/2015. PERFORMANCE STATUS (ECOG):  1 HEENT:  Vision little blurry secondary to diabetes.  Thick oral secretions.  No runny nose or mouth sores. Lungs:  No shortness of breath or cough.  No hemoptysis. Cardiac:  No chest pain, palpitations, orthopnea, or PND.  GI:  Poor oral intake.  No nausea, vomiting, diarrhea, constipation, melena or hematochezia.  No prior colonoscopy. GU:  No urgency, frequency, dysuria, or hematuria.  Musculoskeletal:  No back pain.  Knee aches.  No muscle tenderness. Extremities:  No pain or swelling. Skin:  No rashes or skin changes. Neuro:  No headache, numbness or weakness, balance or coordination issues. Endocrine:  Diabetes.  No thyroid issues, hot flashes or night sweats. Psych:  No mood changes, depression or anxiety. Pain:  Denies pain.   Review of systems:  All other systems reviewed and found to be negative.  Physical Exam: Blood pressure 121/90, pulse (!) 128, temperature (!) 96.4 F (35.8 C), temperature source Tympanic, weight 166 lb 10.7 oz (75.6 kg). GENERAL:  Chronically fatigued appearing gentleman sitting comfortably in the exam room in no acute distress. MENTAL STATUS:  Alert and  oriented to person, place and time. HEAD:  Short wavy hair.  Goatee.  Normocephalic, atraumatic, face symmetric, no Cushingoid features. EYES:  Blue eyes.  Pupils equal round and reactive to light and accomodation.  No conjunctivitis or scleral icterus. ENT:  Oropharynx with thick posterior pharynx secretions.  No thrush.  Tongue normal.  NECK:  Hyperpigmentation left neck s/p radiation.   RESPIRATORY:  Clear to auscultation without rales, wheezes or rhonchi. CARDIOVASCULAR:  Regular rate and rhythm without murmur, rub or  gallop. ABDOMEN:  Soft, non-tender, with active bowel sounds, and no hepatosplenomegaly.  No masses. SKIN:  No rashes, ulcers or lesions. EXTREMITIES:  Upper extremity PICC line.  No edema, no skin discoloration or tenderness.  No palpable cords. LYMPH NODES: Soft left neck adenopathy (3 x 2.5 cm).  No palpable supraclavicular, axillary or inguinal adenopathy  NEUROLOGICAL: Unremarkable. PSYCH:  Appropriate.     Appointment on 12/24/2015  Component Date Value Ref Range Status  . WBC 12/24/2015 5.2  3.8 - 10.6 K/uL Final  . RBC 12/24/2015 3.59* 4.40 - 5.90 MIL/uL Final  . Hemoglobin 12/24/2015 10.7* 13.0 - 18.0 g/dL Final  . HCT 12/24/2015 29.1* 40.0 - 52.0 % Final  . MCV 12/24/2015 81.1  80.0 - 100.0 fL Final  . MCH 12/24/2015 29.7  26.0 - 34.0 pg Final  . MCHC 12/24/2015 36.6* 32.0 - 36.0 g/dL Final  . RDW 12/24/2015 18.4* 11.5 - 14.5 % Final  . Platelets 12/24/2015 347  150 - 440 K/uL Final  . Neutrophils Relative % 12/24/2015 77  % Final  . Neutro Abs 12/24/2015 4.0  1.4 - 6.5 K/uL Final  . Lymphocytes Relative 12/24/2015 6  % Final  . Lymphs Abs 12/24/2015 0.3* 1.0 - 3.6 K/uL Final  . Monocytes Relative 12/24/2015 17  % Final  . Monocytes Absolute 12/24/2015 0.9  0.2 - 1.0 K/uL Final  . Eosinophils Relative 12/24/2015 0  % Final  . Eosinophils Absolute 12/24/2015 0.0  0 - 0.7 K/uL Final  . Basophils Relative 12/24/2015 0  % Final  . Basophils Absolute  12/24/2015 0.0  0 - 0.1 K/uL Final  . Sodium 12/24/2015 127* 135 - 145 mmol/L Final  . Potassium 12/24/2015 2.8* 3.5 - 5.1 mmol/L Final  . Chloride 12/24/2015 90* 101 - 111 mmol/L Final  . CO2 12/24/2015 26  22 - 32 mmol/L Final  . Glucose, Bld 12/24/2015 139* 65 - 99 mg/dL Final  . BUN 12/24/2015 22* 6 - 20 mg/dL Final  . Creatinine, Ser 12/24/2015 0.70  0.61 - 1.24 mg/dL Final  . Calcium 12/24/2015 8.6* 8.9 - 10.3 mg/dL Final  . Total Protein 12/24/2015 6.3* 6.5 - 8.1 g/dL Final  . Albumin 12/24/2015 3.3* 3.5 - 5.0 g/dL Final  . AST 12/24/2015 17  15 - 41 U/L Final  . ALT 12/24/2015 19  17 - 63 U/L Final  . Alkaline Phosphatase 12/24/2015 82  38 - 126 U/L Final  . Total Bilirubin 12/24/2015 0.9  0.3 - 1.2 mg/dL Final  . GFR calc non Af Amer 12/24/2015 >60  >60 mL/min Final  . GFR calc Af Amer 12/24/2015 >60  >60 mL/min Final   Comment: (NOTE) The eGFR has been calculated using the CKD EPI equation. This calculation has not been validated in all clinical situations. eGFR's persistently <60 mL/min signify possible Chronic Kidney Disease.   . Anion gap 12/24/2015 11  5 - 15 Final  . Magnesium 12/24/2015 1.5* 1.7 - 2.4 mg/dL Final    Assessment:  Kent [name] is a 60 y.o. 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  Tumor was p16 IHC positive (high risk HPV).  CT soft tissue neck on 08/17/2015 revealed a slowly progressive over years, infiltrative, deep 4.1 x 5.4 cm LEFT parotid mass with widespread ipsilateral bulky adenopathy.  PET scan at [facility] on 09/17/2015 revealed left base of tongue malignancy corresponding to asymmetric enhancement identified on the neck CT with hypermetabolic extensive conglomerate left  cervical adenopathy (level II-IV) extending from the level of the angle of the mandible down inferiorly to the level of the cricoid cartilage.  There was clustered subcentimeter left level IB submandibular nodes with mild  FDG activity.  There was no evidence of metastatic disease.  Alpha gal IgE was 1.21 (< 0.35) c/w IgE antibodies to galactose-alpha-1,3 galactose (oligosaccharide part on Fab portion of the cetuximab heavy chain) and suggestive of potential cetuximab reaction.  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  Pathologic stage was T4NxM0.  He took imatinib for 1 year.  He is day 21 of cycle #3 cisplatin (10/22/2015 - 12/04/2015) with concurrent radiation.  Radiation completed 12/21/2015.  Left neck adenopathy has dramatically improved.  He has recurrent hyponatremia.  Sodium was 122 on 11/15/2015 prompting overnight admission for IVF (NS).  He received IVF daily last week.  He was admitted to [facility] from 12/08/2015 - 12/09/2015 for hydration and electrolyte replacement. Sodium was 126.  He was readmitted to [facility] from 12/09/2015 - 12/12/2015 secondary to nausea, vomiting, and minimal oral intake.  He received IVF with electrolyte replacement and anti-emetics.  A PICC line was placed for continuation of fluids in the outpatient department.  Symptomatically, he continues to eat and drink very little fluid.  He has mucositis and thick posterior pharynx secretions from radiation.  He has persistent hyponatremia, hypokalemia, and hypomagnesemia.  Plan: 1.  Labs today:  CBC with diff, CMP, Mg. 2.  Discuss importance of drinking fluids and caloric intake.  Discuss importance of taking potassium supplement.  Multiple suggestions made.   3.  Today:  IVF- 1 liter NS + 40 meq KCL + 2 gm Mg over 4 hours 4.  Contact Dr. Olena Leatherwood office for Decadron taper. 5.  RTC 10/24 for BMP and IVF +/- KCL 6.  RTC 10/25 for BMP + Mg and IVF +/- KCL +/- Mg 7.  RTC 10/26 for BMP and IVF +/- KCL 8.  RTC 10/27 for MD assessment, labs (CBC with diff, BMP, Mg) and IVF +/- Mg   Lequita Asal, MD  12/24/2015,  10:52 AM

## 2015-12-24 ENCOUNTER — Encounter: Payer: Self-pay | Admitting: Hematology and Oncology

## 2015-12-24 ENCOUNTER — Inpatient Hospital Stay: Payer: Managed Care, Other (non HMO)

## 2015-12-24 ENCOUNTER — Inpatient Hospital Stay: Payer: Self-pay | Admitting: Unknown Physician Specialty

## 2015-12-24 ENCOUNTER — Ambulatory Visit
Admission: RE | Admit: 2015-12-24 | Discharge: 2015-12-24 | Disposition: A | Discharge status: Home / Self Care | Payer: Managed Care, Other (non HMO) | Source: Ambulatory Visit | Attending: Radiation Oncology | Admitting: Radiation Oncology

## 2015-12-24 ENCOUNTER — Inpatient Hospital Stay (HOSPITAL_BASED_OUTPATIENT_CLINIC_OR_DEPARTMENT_OTHER): Payer: Managed Care, Other (non HMO) | Admitting: Hematology and Oncology

## 2015-12-24 VITALS — BP 106/75 | HR 108

## 2015-12-24 VITALS — BP 121/90 | HR 128 | Temp 96.4°F | Wt 166.7 lb

## 2015-12-24 DIAGNOSIS — C01 Malignant neoplasm of base of tongue: Secondary | ICD-10-CM

## 2015-12-24 DIAGNOSIS — E785 Hyperlipidemia, unspecified: Secondary | ICD-10-CM

## 2015-12-24 DIAGNOSIS — R531 Weakness: Secondary | ICD-10-CM

## 2015-12-24 DIAGNOSIS — E871 Hypo-osmolality and hyponatremia: Secondary | ICD-10-CM

## 2015-12-24 DIAGNOSIS — E860 Dehydration: Secondary | ICD-10-CM

## 2015-12-24 DIAGNOSIS — Z809 Family history of malignant neoplasm, unspecified: Secondary | ICD-10-CM

## 2015-12-24 DIAGNOSIS — Z8043 Family history of malignant neoplasm of testis: Secondary | ICD-10-CM

## 2015-12-24 DIAGNOSIS — E876 Hypokalemia: Secondary | ICD-10-CM

## 2015-12-24 DIAGNOSIS — J029 Acute pharyngitis, unspecified: Secondary | ICD-10-CM

## 2015-12-24 DIAGNOSIS — D696 Thrombocytopenia, unspecified: Secondary | ICD-10-CM | POA: Diagnosis not present

## 2015-12-24 DIAGNOSIS — E119 Type 2 diabetes mellitus without complications: Secondary | ICD-10-CM

## 2015-12-24 DIAGNOSIS — C801 Malignant (primary) neoplasm, unspecified: Secondary | ICD-10-CM

## 2015-12-24 DIAGNOSIS — Z794 Long term (current) use of insulin: Secondary | ICD-10-CM

## 2015-12-24 DIAGNOSIS — E669 Obesity, unspecified: Secondary | ICD-10-CM

## 2015-12-24 DIAGNOSIS — Z800 Family history of malignant neoplasm of digestive organs: Secondary | ICD-10-CM

## 2015-12-24 DIAGNOSIS — Y842 Radiological procedure and radiotherapy as the cause of abnormal reaction of the patient, or of later complication, without mention of misadventure at the time of the procedure: Secondary | ICD-10-CM

## 2015-12-24 DIAGNOSIS — I10 Essential (primary) hypertension: Secondary | ICD-10-CM

## 2015-12-24 DIAGNOSIS — Z7984 Long term (current) use of oral hypoglycemic drugs: Secondary | ICD-10-CM

## 2015-12-24 DIAGNOSIS — M129 Arthropathy, unspecified: Secondary | ICD-10-CM

## 2015-12-24 DIAGNOSIS — Z85028 Personal history of other malignant neoplasm of stomach: Secondary | ICD-10-CM

## 2015-12-24 DIAGNOSIS — R112 Nausea with vomiting, unspecified: Secondary | ICD-10-CM

## 2015-12-24 DIAGNOSIS — B370 Candidal stomatitis: Secondary | ICD-10-CM

## 2015-12-24 DIAGNOSIS — R630 Anorexia: Secondary | ICD-10-CM

## 2015-12-24 DIAGNOSIS — K1231 Oral mucositis (ulcerative) due to antineoplastic therapy: Secondary | ICD-10-CM

## 2015-12-24 DIAGNOSIS — Z79899 Other long term (current) drug therapy: Secondary | ICD-10-CM

## 2015-12-24 LAB — CBC WITH DIFFERENTIAL/PLATELET
Basophils Absolute: 0 10*3/uL (ref 0–0.1)
Basophils Relative: 0 %
EOS ABS: 0 10*3/uL (ref 0–0.7)
Eosinophils Relative: 0 %
HEMATOCRIT: 29.1 % — AB (ref 40.0–52.0)
HEMOGLOBIN: 10.7 g/dL — AB (ref 13.0–18.0)
LYMPHS ABS: 0.3 10*3/uL — AB (ref 1.0–3.6)
LYMPHS PCT: 6 %
MCH: 29.7 pg (ref 26.0–34.0)
MCHC: 36.6 g/dL — AB (ref 32.0–36.0)
MCV: 81.1 fL (ref 80.0–100.0)
MONOS PCT: 17 %
Monocytes Absolute: 0.9 10*3/uL (ref 0.2–1.0)
NEUTROS PCT: 77 %
Neutro Abs: 4 10*3/uL (ref 1.4–6.5)
Platelets: 347 10*3/uL (ref 150–440)
RBC: 3.59 MIL/uL — ABNORMAL LOW (ref 4.40–5.90)
RDW: 18.4 % — ABNORMAL HIGH (ref 11.5–14.5)
WBC: 5.2 10*3/uL (ref 3.8–10.6)

## 2015-12-24 LAB — COMPREHENSIVE METABOLIC PANEL
ALK PHOS: 82 U/L (ref 38–126)
ALT: 19 U/L (ref 17–63)
ANION GAP: 11 (ref 5–15)
AST: 17 U/L (ref 15–41)
Albumin: 3.3 g/dL — ABNORMAL LOW (ref 3.5–5.0)
BILIRUBIN TOTAL: 0.9 mg/dL (ref 0.3–1.2)
BUN: 22 mg/dL — ABNORMAL HIGH (ref 6–20)
CALCIUM: 8.6 mg/dL — AB (ref 8.9–10.3)
CO2: 26 mmol/L (ref 22–32)
CREATININE: 0.7 mg/dL (ref 0.61–1.24)
Chloride: 90 mmol/L — ABNORMAL LOW (ref 101–111)
Glucose, Bld: 139 mg/dL — ABNORMAL HIGH (ref 65–99)
Potassium: 2.8 mmol/L — ABNORMAL LOW (ref 3.5–5.1)
SODIUM: 127 mmol/L — AB (ref 135–145)
TOTAL PROTEIN: 6.3 g/dL — AB (ref 6.5–8.1)

## 2015-12-24 LAB — MAGNESIUM: Magnesium: 1.5 mg/dL — ABNORMAL LOW (ref 1.7–2.4)

## 2015-12-24 MED ORDER — HEPARIN SOD (PORK) LOCK FLUSH 100 UNIT/ML IV SOLN
500.0000 [IU] | Freq: Once | INTRAVENOUS | Status: AC
  Administered 2015-12-24: 500 [IU] via INTRAVENOUS

## 2015-12-24 MED ORDER — SODIUM CHLORIDE 0.9 % IV SOLN
INTRAVENOUS | Status: AC
  Administered 2015-12-24: 12:00:00 via INTRAVENOUS
  Filled 2015-12-24: qty 1000

## 2015-12-24 NOTE — Progress Notes (Signed)
Pt refused to stay a couple more hours for additional fluids. See orthostatics, Dr Mike Gip gave instructions to tell pt to log everything that he drinks until 0800 tomorrow and return to [facility]

## 2015-12-24 NOTE — Progress Notes (Signed)
Patient states he wakes up every hour during the night because his mouth fills up with "slime".  States he has to spit it out.  Makes him nauseated.  Decreased appetite.  States he is able to drink but does not eat much.  States he had [place] soup yesterday.   HR 128  BP 121/90.  Patient did not pick up K+ powder from pharmacy and is not using Mucinex.

## 2015-12-25 ENCOUNTER — Other Ambulatory Visit: Payer: Self-pay | Admitting: *Deleted

## 2015-12-25 ENCOUNTER — Inpatient Hospital Stay: Payer: Managed Care, Other (non HMO)

## 2015-12-25 VITALS — BP 130/83 | HR 109 | Temp 98.1°F | Resp 18

## 2015-12-25 DIAGNOSIS — E871 Hypo-osmolality and hyponatremia: Secondary | ICD-10-CM

## 2015-12-25 DIAGNOSIS — E876 Hypokalemia: Secondary | ICD-10-CM

## 2015-12-25 DIAGNOSIS — C01 Malignant neoplasm of base of tongue: Secondary | ICD-10-CM

## 2015-12-25 DIAGNOSIS — E860 Dehydration: Secondary | ICD-10-CM

## 2015-12-25 LAB — BASIC METABOLIC PANEL
Anion gap: 9 (ref 5–15)
BUN: 16 mg/dL (ref 6–20)
CO2: 27 mmol/L (ref 22–32)
Calcium: 8.3 mg/dL — ABNORMAL LOW (ref 8.9–10.3)
Chloride: 90 mmol/L — ABNORMAL LOW (ref 101–111)
Creatinine, Ser: 0.75 mg/dL (ref 0.61–1.24)
GFR calc Af Amer: >60 mL/min (ref >60)
GFR calc non Af Amer: >60 mL/min (ref >60)
Glucose, Bld: 184 mg/dL — ABNORMAL HIGH (ref 65–99)
Potassium: 2.9 mmol/L — ABNORMAL LOW (ref 3.5–5.1)
Sodium: 126 mmol/L — ABNORMAL LOW (ref 135–145)

## 2015-12-25 MED ORDER — HEPARIN SOD (PORK) LOCK FLUSH 100 UNIT/ML IV SOLN
250.0000 [IU] | INTRAVENOUS | Status: AC | PRN
  Administered 2015-12-25: 250 [IU]
  Filled 2015-12-25: qty 5

## 2015-12-25 MED ORDER — SODIUM CHLORIDE 0.9% FLUSH
10.0000 mL | INTRAVENOUS | Status: AC | PRN
  Administered 2015-12-25: 10 mL
  Filled 2015-12-25: qty 10

## 2015-12-25 MED ORDER — SODIUM CHLORIDE 0.9 % IV SOLN
INTRAVENOUS | Status: AC
  Administered 2015-12-25: 10:00:00 via INTRAVENOUS
  Filled 2015-12-25: qty 1000

## 2015-12-25 MED ORDER — SODIUM CHLORIDE 0.9% FLUSH
10.0000 mL | INTRAVENOUS | Status: DC | PRN
  Administered 2015-12-25: 10 mL via INTRAVENOUS
  Filled 2015-12-25: qty 10

## 2015-12-26 ENCOUNTER — Inpatient Hospital Stay: Payer: Managed Care, Other (non HMO)

## 2015-12-26 VITALS — BP 135/91 | HR 87 | Temp 97.6°F | Resp 18

## 2015-12-26 DIAGNOSIS — E860 Dehydration: Secondary | ICD-10-CM

## 2015-12-26 DIAGNOSIS — C01 Malignant neoplasm of base of tongue: Secondary | ICD-10-CM | POA: Diagnosis not present

## 2015-12-26 DIAGNOSIS — E871 Hypo-osmolality and hyponatremia: Secondary | ICD-10-CM

## 2015-12-26 LAB — BASIC METABOLIC PANEL
Anion gap: 10 (ref 5–15)
BUN: 12 mg/dL (ref 6–20)
CO2: 26 mmol/L (ref 22–32)
Calcium: 8.5 mg/dL — ABNORMAL LOW (ref 8.9–10.3)
Chloride: 93 mmol/L — ABNORMAL LOW (ref 101–111)
Creatinine, Ser: 0.75 mg/dL (ref 0.61–1.24)
GFR calc Af Amer: >60 mL/min (ref >60)
GFR calc non Af Amer: >60 mL/min (ref >60)
Glucose, Bld: 130 mg/dL — ABNORMAL HIGH (ref 65–99)
Potassium: 3 mmol/L — ABNORMAL LOW (ref 3.5–5.1)
Sodium: 129 mmol/L — ABNORMAL LOW (ref 135–145)

## 2015-12-26 LAB — MAGNESIUM: Magnesium: 1.6 mg/dL — ABNORMAL LOW (ref 1.7–2.4)

## 2015-12-26 MED ORDER — SODIUM CHLORIDE 0.9 % IV SOLN
INTRAVENOUS | Status: AC
  Administered 2015-12-26: 10:00:00 via INTRAVENOUS
  Filled 2015-12-26: qty 1000

## 2015-12-26 MED ORDER — SODIUM CHLORIDE 0.9 % IJ SOLN
10.0000 mL | INTRAMUSCULAR | Status: DC | PRN
  Administered 2015-12-26: 10 mL
  Filled 2015-12-26: qty 10

## 2015-12-26 MED ORDER — HEPARIN SOD (PORK) LOCK FLUSH 100 UNIT/ML IV SOLN
250.0000 [IU] | Freq: Once | INTRAVENOUS | Status: AC | PRN
  Administered 2015-12-26: 250 [IU]
  Filled 2015-12-26: qty 5

## 2015-12-27 ENCOUNTER — Inpatient Hospital Stay: Payer: Managed Care, Other (non HMO)

## 2015-12-27 DIAGNOSIS — C01 Malignant neoplasm of base of tongue: Secondary | ICD-10-CM

## 2015-12-27 DIAGNOSIS — E860 Dehydration: Secondary | ICD-10-CM

## 2015-12-27 DIAGNOSIS — E871 Hypo-osmolality and hyponatremia: Secondary | ICD-10-CM

## 2015-12-27 LAB — BASIC METABOLIC PANEL
Anion gap: 12 (ref 5–15)
BUN: 10 mg/dL (ref 6–20)
CO2: 24 mmol/L (ref 22–32)
Calcium: 8.4 mg/dL — ABNORMAL LOW (ref 8.9–10.3)
Chloride: 90 mmol/L — ABNORMAL LOW (ref 101–111)
Creatinine, Ser: 0.75 mg/dL (ref 0.61–1.24)
GFR calc Af Amer: >60 mL/min (ref >60)
GFR calc non Af Amer: >60 mL/min (ref >60)
Glucose, Bld: 124 mg/dL — ABNORMAL HIGH (ref 65–99)
Potassium: 3 mmol/L — ABNORMAL LOW (ref 3.5–5.1)
Sodium: 126 mmol/L — ABNORMAL LOW (ref 135–145)

## 2015-12-27 MED ORDER — SODIUM CHLORIDE 0.9 % IV SOLN
INTRAVENOUS | Status: DC
  Administered 2015-12-27: 11:00:00 via INTRAVENOUS
  Filled 2015-12-27: qty 1000

## 2015-12-27 NOTE — Progress Notes (Signed)
Pt here today for 3hr infusion of KCL. Approximately 45 minutes into treatment pt decided he did not want to stay and requested to be unhooked from IV. Encouraged pt to stay for duration and told him his potassium would probably drop more if he did not get full treatment. Pt refused to stay. Pt scheduled to come back tomorrow for lab/MD/IVF. Reminded pt of tomorrow's appt and encouraged  him to return tomorrow for at least lab and MD appt. Pt agreed as he was leaving.

## 2015-12-27 NOTE — Progress Notes (Signed)
  Chief complaint: Mr. pace returns today to follow-up on the blood work.  History of present illness ROS: He is a patient with carcinoma of the base of the tongue stage Ivb  He has  chronic hyponatremia and his oral intake has been an issue during the his treatments with platinum and radiation. And has been receiving frequent pericentral IV fluids with electrolyte support.  He completed his last dose of radiation today.  He continues to eat and drink poorly.   However he denies any diarrhea or nausea or vomiting. He denies any fevers mouth sores  His exam shows dry mucous membranes and skin . Oral cavity is without congestion  left side of the neck does show some redness and increased pigmentation from radiation. Extremities are without edema.   At his last visit his sodium was 1:30 on 1016 and his potassium was 3.3 creatinine was normal calcium was normal magnesium versus 1.5   Impression and plan: Hyponatremia mild hypokalemia and hypomagnesemia due to poor oral intake related to concurrent chemoradiation for head and neck cancer. We'll proceed with IV fluids with potassium and micturition supplements as previously scheduled for today. He'll continue to take oral medications supplements and increase potassium intake including bananas about 2-3 per day. He will return on Monday for repeat blood work and to see Dr. Mike Gip.

## 2015-12-28 ENCOUNTER — Encounter: Payer: Self-pay | Admitting: *Deleted

## 2015-12-28 ENCOUNTER — Inpatient Hospital Stay
Admission: AD | Admit: 2015-12-28 | Discharge: 2015-12-31 | DRG: 641 | Disposition: A | Discharge status: Home / Self Care | Payer: Managed Care, Other (non HMO) | Source: Ambulatory Visit | Attending: Internal Medicine | Admitting: Internal Medicine

## 2015-12-28 ENCOUNTER — Inpatient Hospital Stay (HOSPITAL_BASED_OUTPATIENT_CLINIC_OR_DEPARTMENT_OTHER): Payer: Managed Care, Other (non HMO) | Admitting: Hematology and Oncology

## 2015-12-28 ENCOUNTER — Inpatient Hospital Stay: Payer: Managed Care, Other (non HMO)

## 2015-12-28 VITALS — BP 127/98 | HR 120 | Temp 96.7°F | Resp 18

## 2015-12-28 VITALS — BP 116/85 | HR 123 | Temp 97.7°F | Resp 18 | Wt 169.1 lb

## 2015-12-28 DIAGNOSIS — E785 Hyperlipidemia, unspecified: Secondary | ICD-10-CM | POA: Diagnosis present

## 2015-12-28 DIAGNOSIS — I10 Essential (primary) hypertension: Secondary | ICD-10-CM | POA: Diagnosis present

## 2015-12-28 DIAGNOSIS — E1152 Type 2 diabetes mellitus with diabetic peripheral angiopathy with gangrene: Secondary | ICD-10-CM | POA: Diagnosis present

## 2015-12-28 DIAGNOSIS — D696 Thrombocytopenia, unspecified: Secondary | ICD-10-CM | POA: Diagnosis not present

## 2015-12-28 DIAGNOSIS — Z923 Personal history of irradiation: Secondary | ICD-10-CM | POA: Diagnosis not present

## 2015-12-28 DIAGNOSIS — M129 Arthropathy, unspecified: Secondary | ICD-10-CM | POA: Diagnosis not present

## 2015-12-28 DIAGNOSIS — K1231 Oral mucositis (ulcerative) due to antineoplastic therapy: Secondary | ICD-10-CM | POA: Diagnosis not present

## 2015-12-28 DIAGNOSIS — E876 Hypokalemia: Secondary | ICD-10-CM

## 2015-12-28 DIAGNOSIS — K219 Gastro-esophageal reflux disease without esophagitis: Secondary | ICD-10-CM | POA: Diagnosis present

## 2015-12-28 DIAGNOSIS — C01 Malignant neoplasm of base of tongue: Secondary | ICD-10-CM

## 2015-12-28 DIAGNOSIS — R531 Weakness: Secondary | ICD-10-CM

## 2015-12-28 DIAGNOSIS — Z794 Long term (current) use of insulin: Secondary | ICD-10-CM | POA: Diagnosis not present

## 2015-12-28 DIAGNOSIS — E669 Obesity, unspecified: Secondary | ICD-10-CM

## 2015-12-28 DIAGNOSIS — E871 Hypo-osmolality and hyponatremia: Secondary | ICD-10-CM

## 2015-12-28 DIAGNOSIS — K1230 Oral mucositis (ulcerative), unspecified: Secondary | ICD-10-CM | POA: Diagnosis present

## 2015-12-28 DIAGNOSIS — I70262 Atherosclerosis of native arteries of extremities with gangrene, left leg: Secondary | ICD-10-CM | POA: Diagnosis not present

## 2015-12-28 DIAGNOSIS — Z79899 Other long term (current) drug therapy: Secondary | ICD-10-CM | POA: Diagnosis not present

## 2015-12-28 DIAGNOSIS — E860 Dehydration: Secondary | ICD-10-CM

## 2015-12-28 DIAGNOSIS — R630 Anorexia: Secondary | ICD-10-CM | POA: Diagnosis not present

## 2015-12-28 DIAGNOSIS — C099 Malignant neoplasm of tonsil, unspecified: Secondary | ICD-10-CM | POA: Diagnosis present

## 2015-12-28 DIAGNOSIS — Z800 Family history of malignant neoplasm of digestive organs: Secondary | ICD-10-CM

## 2015-12-28 DIAGNOSIS — Z8043 Family history of malignant neoplasm of testis: Secondary | ICD-10-CM | POA: Diagnosis not present

## 2015-12-28 DIAGNOSIS — Z7984 Long term (current) use of oral hypoglycemic drugs: Secondary | ICD-10-CM

## 2015-12-28 DIAGNOSIS — Z9221 Personal history of antineoplastic chemotherapy: Secondary | ICD-10-CM | POA: Diagnosis not present

## 2015-12-28 DIAGNOSIS — R112 Nausea with vomiting, unspecified: Secondary | ICD-10-CM | POA: Diagnosis not present

## 2015-12-28 DIAGNOSIS — E119 Type 2 diabetes mellitus without complications: Secondary | ICD-10-CM

## 2015-12-28 DIAGNOSIS — B370 Candidal stomatitis: Secondary | ICD-10-CM

## 2015-12-28 DIAGNOSIS — J029 Acute pharyngitis, unspecified: Secondary | ICD-10-CM | POA: Diagnosis not present

## 2015-12-28 DIAGNOSIS — R1310 Dysphagia, unspecified: Secondary | ICD-10-CM | POA: Diagnosis present

## 2015-12-28 DIAGNOSIS — Z5111 Encounter for antineoplastic chemotherapy: Secondary | ICD-10-CM | POA: Diagnosis not present

## 2015-12-28 DIAGNOSIS — Z87891 Personal history of nicotine dependence: Secondary | ICD-10-CM | POA: Diagnosis not present

## 2015-12-28 DIAGNOSIS — Z809 Family history of malignant neoplasm, unspecified: Secondary | ICD-10-CM | POA: Diagnosis not present

## 2015-12-28 DIAGNOSIS — Y842 Radiological procedure and radiotherapy as the cause of abnormal reaction of the patient, or of later complication, without mention of misadventure at the time of the procedure: Secondary | ICD-10-CM | POA: Diagnosis not present

## 2015-12-28 DIAGNOSIS — Z85028 Personal history of other malignant neoplasm of stomach: Secondary | ICD-10-CM

## 2015-12-28 DIAGNOSIS — I70261 Atherosclerosis of native arteries of extremities with gangrene, right leg: Secondary | ICD-10-CM | POA: Diagnosis not present

## 2015-12-28 DIAGNOSIS — R4701 Aphasia: Secondary | ICD-10-CM | POA: Diagnosis present

## 2015-12-28 LAB — CBC WITH DIFFERENTIAL/PLATELET
Basophils Absolute: 0 10*3/uL (ref 0–0.1)
Basophils Relative: 0 %
Eosinophils Absolute: 0 10*3/uL (ref 0–0.7)
Eosinophils Relative: 0 %
HCT: 29 % — ABNORMAL LOW (ref 40.0–52.0)
Hemoglobin: 10.5 g/dL — ABNORMAL LOW (ref 13.0–18.0)
Lymphocytes Relative: 3 %
Lymphs Abs: 0.3 10*3/uL — ABNORMAL LOW (ref 1.0–3.6)
MCH: 29.8 pg (ref 26.0–34.0)
MCHC: 36.2 g/dL — ABNORMAL HIGH (ref 32.0–36.0)
MCV: 82.2 fL (ref 80.0–100.0)
Monocytes Absolute: 1.1 10*3/uL — ABNORMAL HIGH (ref 0.2–1.0)
Monocytes Relative: 10 %
Neutro Abs: 10.2 10*3/uL — ABNORMAL HIGH (ref 1.4–6.5)
Neutrophils Relative %: 87 %
Platelets: 332 10*3/uL (ref 150–440)
RBC: 3.53 MIL/uL — ABNORMAL LOW (ref 4.40–5.90)
RDW: 18.1 % — ABNORMAL HIGH (ref 11.5–14.5)
WBC: 11.7 10*3/uL — ABNORMAL HIGH (ref 3.8–10.6)

## 2015-12-28 LAB — BASIC METABOLIC PANEL
Anion gap: 9 (ref 5–15)
BUN: 12 mg/dL (ref 6–20)
CO2: 27 mmol/L (ref 22–32)
Calcium: 8.2 mg/dL — ABNORMAL LOW (ref 8.9–10.3)
Chloride: 88 mmol/L — ABNORMAL LOW (ref 101–111)
Creatinine, Ser: 0.68 mg/dL (ref 0.61–1.24)
GFR calc Af Amer: >60 mL/min (ref >60)
GFR calc non Af Amer: >60 mL/min (ref >60)
Glucose, Bld: 181 mg/dL — ABNORMAL HIGH (ref 65–99)
Potassium: 3.2 mmol/L — ABNORMAL LOW (ref 3.5–5.1)
Sodium: 124 mmol/L — ABNORMAL LOW (ref 135–145)

## 2015-12-28 LAB — MAGNESIUM: Magnesium: 1.5 mg/dL — ABNORMAL LOW (ref 1.7–2.4)

## 2015-12-28 LAB — GLUCOSE, CAPILLARY
GLUCOSE-CAPILLARY: 167 mg/dL — AB (ref 65–99)
Glucose-Capillary: 105 mg/dL — ABNORMAL HIGH (ref 65–99)

## 2015-12-28 MED ORDER — ENOXAPARIN SODIUM 40 MG/0.4ML ~~LOC~~ SOLN
40.0000 mg | SUBCUTANEOUS | Status: DC
  Administered 2015-12-28 – 2015-12-30 (×3): 40 mg via SUBCUTANEOUS
  Filled 2015-12-28 (×3): qty 0.4

## 2015-12-28 MED ORDER — BOOST / RESOURCE BREEZE PO LIQD
1.0000 | Freq: Three times a day (TID) | ORAL | Status: DC
  Administered 2015-12-28 – 2015-12-30 (×6): 1 via ORAL

## 2015-12-28 MED ORDER — MAGNESIUM SULFATE 4 GM/100ML IV SOLN
4.0000 g | Freq: Once | INTRAVENOUS | Status: AC
  Administered 2015-12-28: 4 g via INTRAVENOUS
  Filled 2015-12-28: qty 100

## 2015-12-28 MED ORDER — ONDANSETRON HCL 4 MG PO TABS: 4.0000 mg | ORAL_TABLET | Freq: Four times a day (QID) | ORAL | Status: DC | PRN

## 2015-12-28 MED ORDER — ACETAMINOPHEN 325 MG PO TABS: 650.0000 mg | ORAL_TABLET | Freq: Four times a day (QID) | ORAL | Status: DC | PRN

## 2015-12-28 MED ORDER — MAGNESIUM OXIDE 400 (241.3 MG) MG PO TABS
400.0000 mg | ORAL_TABLET | Freq: Every day | ORAL | Status: DC
  Administered 2015-12-28 – 2015-12-30 (×3): 400 mg via ORAL
  Filled 2015-12-28 (×3): qty 1

## 2015-12-28 MED ORDER — SODIUM CHLORIDE 1 G PO TABS
1.0000 g | ORAL_TABLET | Freq: Two times a day (BID) | ORAL | Status: DC
  Administered 2015-12-28 – 2015-12-31 (×6): 1 g via ORAL
  Filled 2015-12-28 (×6): qty 1

## 2015-12-28 MED ORDER — ORAL CARE MOUTH RINSE: 15.0000 mL | Freq: Two times a day (BID) | OROMUCOSAL | Status: DC

## 2015-12-28 MED ORDER — DEXTROMETHORPHAN POLISTIREX ER 30 MG/5ML PO SUER
30.0000 mg | Freq: Two times a day (BID) | ORAL | Status: DC
  Administered 2015-12-28 – 2015-12-29 (×3): 30 mg via ORAL
  Filled 2015-12-28 (×7): qty 5

## 2015-12-28 MED ORDER — FLUCONAZOLE 100 MG PO TABS
100.0000 mg | ORAL_TABLET | Freq: Every day | ORAL | Status: DC
  Administered 2015-12-28 – 2015-12-30 (×3): 100 mg via ORAL
  Filled 2015-12-28 (×3): qty 1

## 2015-12-28 MED ORDER — LORAZEPAM 0.5 MG PO TABS: 0.5000 mg | ORAL_TABLET | Freq: Four times a day (QID) | ORAL | Status: DC | PRN

## 2015-12-28 MED ORDER — PANTOPRAZOLE SODIUM 40 MG PO TBEC
40.0000 mg | DELAYED_RELEASE_TABLET | Freq: Every day | ORAL | Status: DC
  Administered 2015-12-28 – 2015-12-31 (×4): 40 mg via ORAL
  Filled 2015-12-28 (×4): qty 1

## 2015-12-28 MED ORDER — DM-GUAIFENESIN ER 30-600 MG PO TB12: 1.0000 | ORAL_TABLET | Freq: Two times a day (BID) | ORAL | Status: DC

## 2015-12-28 MED ORDER — POTASSIUM CHLORIDE 20 MEQ PO PACK
40.0000 meq | PACK | Freq: Two times a day (BID) | ORAL | Status: DC
  Administered 2015-12-28: 20:00:00 40 meq via ORAL
  Filled 2015-12-28: qty 2

## 2015-12-28 MED ORDER — MENTHOL 3 MG MT LOZG
1.0000 | LOZENGE | OROMUCOSAL | Status: DC | PRN
  Filled 2015-12-28 (×2): qty 9

## 2015-12-28 MED ORDER — OXYCODONE-ACETAMINOPHEN 5-325 MG PO TABS: 1.0000 | ORAL_TABLET | Freq: Four times a day (QID) | ORAL | Status: DC | PRN

## 2015-12-28 MED ORDER — INSULIN ASPART 100 UNIT/ML ~~LOC~~ SOLN: 0.0000 [IU] | Freq: Every day | SUBCUTANEOUS | Status: DC

## 2015-12-28 MED ORDER — ONDANSETRON HCL 4 MG/2ML IJ SOLN: 4.0000 mg | Freq: Four times a day (QID) | INTRAMUSCULAR | Status: DC | PRN

## 2015-12-28 MED ORDER — GUAIFENESIN ER 600 MG PO TB12
600.0000 mg | ORAL_TABLET | Freq: Two times a day (BID) | ORAL | Status: DC
  Administered 2015-12-28 – 2015-12-29 (×3): 600 mg via ORAL
  Filled 2015-12-28 (×3): qty 1

## 2015-12-28 MED ORDER — MAGIC MOUTHWASH: 5.0000 mL | Freq: Four times a day (QID) | ORAL | Status: DC | PRN

## 2015-12-28 MED ORDER — POTASSIUM CHLORIDE IN NACL 20-0.9 MEQ/L-% IV SOLN
INTRAVENOUS | Status: DC
  Administered 2015-12-28 – 2015-12-31 (×5): via INTRAVENOUS
  Filled 2015-12-28 (×10): qty 1000

## 2015-12-28 MED ORDER — SODIUM CHLORIDE 0.9 % IV SOLN
INTRAVENOUS | Status: DC
  Administered 2015-12-28: 10:00:00 via INTRAVENOUS
  Filled 2015-12-28 (×2): qty 1000

## 2015-12-28 MED ORDER — MELOXICAM 7.5 MG PO TABS
15.0000 mg | ORAL_TABLET | Freq: Every day | ORAL | Status: DC
  Administered 2015-12-28 – 2015-12-31 (×4): 15 mg via ORAL
  Filled 2015-12-28 (×4): qty 2

## 2015-12-28 MED ORDER — ATORVASTATIN CALCIUM 20 MG PO TABS
20.0000 mg | ORAL_TABLET | Freq: Every day | ORAL | Status: DC
  Administered 2015-12-28 – 2015-12-30 (×3): 20 mg via ORAL
  Filled 2015-12-28 (×3): qty 1

## 2015-12-28 MED ORDER — CLOPIDOGREL BISULFATE 75 MG PO TABS
75.0000 mg | ORAL_TABLET | Freq: Every day | ORAL | Status: DC
  Administered 2015-12-28 – 2015-12-31 (×4): 75 mg via ORAL
  Filled 2015-12-28 (×4): qty 1

## 2015-12-28 MED ORDER — LISINOPRIL 20 MG PO TABS
20.0000 mg | ORAL_TABLET | Freq: Every day | ORAL | Status: DC
  Administered 2015-12-29 – 2015-12-31 (×3): 20 mg via ORAL
  Filled 2015-12-28 (×3): qty 1

## 2015-12-28 MED ORDER — SUCRALFATE 1 G PO TABS
1.0000 g | ORAL_TABLET | Freq: Three times a day (TID) | ORAL | Status: DC
  Administered 2015-12-28 – 2015-12-31 (×9): 1 g via ORAL
  Filled 2015-12-28 (×10): qty 1

## 2015-12-28 MED ORDER — CHLORHEXIDINE GLUCONATE 0.12 % MT SOLN
15.0000 mL | Freq: Two times a day (BID) | OROMUCOSAL | Status: DC
Start: 2015-12-28 — End: 2015-12-31
  Administered 2015-12-28 – 2015-12-30 (×5): 15 mL via OROMUCOSAL
  Filled 2015-12-28 (×4): qty 15

## 2015-12-28 MED ORDER — CYCLOBENZAPRINE HCL 10 MG PO TABS
10.0000 mg | ORAL_TABLET | Freq: Every day | ORAL | Status: DC
  Administered 2015-12-30: 22:00:00 10 mg via ORAL
  Filled 2015-12-28 (×2): qty 1

## 2015-12-28 MED ORDER — ACETAMINOPHEN 650 MG RE SUPP: 650.0000 mg | Freq: Four times a day (QID) | RECTAL | Status: DC | PRN

## 2015-12-28 MED ORDER — METFORMIN HCL 500 MG PO TABS
1000.0000 mg | ORAL_TABLET | Freq: Two times a day (BID) | ORAL | Status: DC
  Administered 2015-12-28 – 2015-12-30 (×5): 1000 mg via ORAL
  Filled 2015-12-28 (×5): qty 2

## 2015-12-28 MED ORDER — INSULIN ASPART 100 UNIT/ML ~~LOC~~ SOLN
0.0000 [IU] | Freq: Three times a day (TID) | SUBCUTANEOUS | Status: DC
Start: 2015-12-28 — End: 2015-12-31
  Administered 2015-12-28: 2 [IU] via SUBCUTANEOUS
  Filled 2015-12-28: qty 2

## 2015-12-28 NOTE — Progress Notes (Signed)
[place] [facility] day:  12/28/15  Chief Complaint: Kent [name] is a 60 y.o. 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  HPI:  The patient was last seen in the medical oncology clinic on 12/24/2015.  At that time,  his oral intake was poor.  He was not taking his electrolyte supplements.  Decision was made for daily fluids with electrolytes.  Chemistries have been monitored.  Sodium has ranged between 126-129.  Potassium has ranged between 2.8 - 3.0.  Magnesium has ranged between 1.5 - 1.6.  He has received fluids with electrolytes (potassium +/- magnesium).  He has been frustrated with coming to clinic daily.  He left early yesterday prior to completing his liter of fluids.  He has continued to drink poorly.  Yesterday he had "some Pepsi and tea".  He ate a bit of sausage and bisquit.  He took his potassium orally for the first time last night and this morning.   Past Medical History:  Diagnosis Date  . Arthritis   . Diabetes mellitus without complication ([place])   . Hyperlipidemia   . Hypertension   . Obesity   . Stomach cancer ([place])    had tumor removed  . Tongue cancer [facility])     Past Surgical History:  Procedure Laterality Date  . gastric tumor removed  2010    Family History  Problem Relation Age of Onset  . Heart disease Mother   . Hypertension Father   . Cancer Sister     stomach  . Heart disease Brother     MI  . Cancer Daughter   . Cancer Son     testicular  . ADD / ADHD Sister     Social History:  reports that he has never smoked. He has never used smokeless tobacco. He reports that he does not drink alcohol or use drugs.  He smokes a pipe and cigar rarely (once every 3 months).  He has a son and daughter.  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  He has planned (and paid for) a  trip to the beach from 10/15/2015 - 10/20/2015.  He was no short term disability.  He has 10 weeks of vacation and sick leave.  The patient is alone today.  Allergies: No Known Allergies  Current Medications: Current Outpatient Prescriptions  Medication Sig Dispense Refill  . atorvastatin (LIPITOR) 20 MG tablet Take 1 tablet (20 mg total) by mouth daily. Reported on 04/06/2015 90 tablet 1  . blood glucose meter kit and supplies KIT Dispense based on patient and insurance preference. Use up to four times daily as directed. (FOR ICD-9 250.00, 250.01). 1 each 12  . cyclobenzaprine (FLEXERIL) 10 MG tablet Take 1 tablet (10 mg total) by mouth at bedtime. 30 tablet 2  . dextromethorphan-guaiFENesin (MUCINEX DM) 30-600 MG 12hr tablet Take 1 tablet by mouth 2 (two) times daily.    . feeding supplement (BOOST / RESOURCE BREEZE) LIQD Take 1 Container by mouth 3 (three) times daily between meals. 20000 mL 2  . fluconazole (DIFLUCAN) 100 MG tablet Take 1 tablet (100 mg total) by mouth daily. 11 tablet 0  . hydrocortisone (CORTEF) 10 MG tablet     . lisinopril (PRINIVIL,ZESTRIL) 20 MG tablet Take 1 tablet (20 mg total) by mouth daily. 30 tablet 5  .  LORazepam (ATIVAN) 0.5 MG tablet Take 1 tablet (0.5 mg total) by mouth every 6 (six) hours as needed (Nausea or vomiting). 30 tablet 0  . magic mouthwash SOLN Take 5 mLs by mouth 4 (four) times daily as needed for mouth pain. 480 mL 1  . magnesium oxide (MAG-OX) 400 (241.3 Mg) MG tablet Take 1 tablet (400 mg total) by mouth daily. 60 tablet 2  . meloxicam (MOBIC) 15 MG tablet Take 1 tablet (15 mg total) by mouth daily. 30 tablet 0  . menthol-cetylpyridinium (CEPACOL) 3 MG lozenge Take 1 lozenge (3 mg total) by mouth as needed for sore throat. 100 tablet 12  . metFORMIN (GLUCOPHAGE) 1000 MG tablet Take 1 tablet (1,000 mg total) by mouth 2 (two) times daily with a meal. 180 tablet 3  . ondansetron (ZOFRAN) 8 MG tablet Take 1 tablet (8 mg total) by mouth 2 (two) times  daily as needed. Start on the third day after chemotherapy. 30 tablet 1  . oxyCODONE-acetaminophen (PERCOCET/ROXICET) 5-325 MG tablet Take 1 tablet by mouth every 6 (six) hours as needed for severe pain. 40 tablet 0  . potassium chloride (KLOR-CON) 20 MEQ packet Take 40 mEq by mouth 2 (two) times daily. 60 packet 0  . prochlorperazine (COMPAZINE) 10 MG tablet Take 1 tablet (10 mg total) by mouth every 6 (six) hours as needed (Nausea or vomiting). 30 tablet 1  . sodium chloride 1 g tablet Take 1 tablet (1 g total) by mouth 2 (two) times daily with a meal. 30 tablet 0  . sucralfate (CARAFATE) 1 g tablet Take 1 tablet (1 g total) by mouth 3 (three) times daily. Dissolve in 2 to 3 tbs warm water, swish &swallow 90 tablet 3  . UNIFINE PENTIPS 31G X 6 MM MISC 4 (four) times daily.     No current facility-administered medications for this visit.     Review of Systems:  GENERAL:  Feels tired.  No fevers or sweats.  Weight down 20 pounds since 12/04/2015. PERFORMANCE STATUS (ECOG):  1 HEENT:  Vision little blurry secondary to diabetes.  Thick oral secretions.  Throat sore.  Difficulty swallowing.  No runny nose or mouth sores. Lungs:  No shortness of breath or cough.  No hemoptysis. Cardiac:  No chest pain, palpitations, orthopnea, or PND.  GI:  Poor oral intake.  No nausea, vomiting, diarrhea, constipation, melena or hematochezia.  No prior colonoscopy. GU:  No urgency, frequency, dysuria, or hematuria.  Musculoskeletal:  No back pain.  Knee aches.  No muscle tenderness. Extremities:  No pain or swelling. Skin:  No rashes or skin changes. Neuro:  No headache, numbness or weakness, balance or coordination issues. Endocrine:  Diabetes.  No thyroid issues, hot flashes or night sweats. Psych:  No mood changes, depression or anxiety. Pain:  Throat pain. Review of systems:  All other systems reviewed and found to be negative.  Physical Exam: Blood pressure 116/85, pulse (!) 123, temperature 97.7 F  (36.5 C), temperature source Tympanic, resp. rate 18, weight 169 lb 1.5 oz (76.7 kg). GENERAL:  Chronically fatigued somewhat disheveled appearing gentleman sitting comfortably in the exam room in no acute distress. MENTAL STATUS:  Alert and oriented to person, place and time. HEAD:  Short gray hair.  Goatee.  Normocephalic, atraumatic, face symmetric, no Cushingoid features. EYES:  Blue eyes.  Pupils equal round and reactive to light and accomodation.  No conjunctivitis or scleral icterus. ENT:  Oropharynx with thick posterior pharynx secretions.  No thrush.  Tongue  normal.  NECK:  Hyperpigmentation left neck with decreased adenopathy s/p radiation.   RESPIRATORY:  Clear to auscultation without rales, wheezes or rhonchi. CARDIOVASCULAR:  Regular rate and rhythm without murmur, rub or gallop. ABDOMEN:  Soft, non-tender, with active bowel sounds, and no hepatosplenomegaly.  No masses. SKIN:  No rashes, ulcers or lesions. EXTREMITIES:  Upper extremity PICC line.  No edema, no skin discoloration or tenderness.  No palpable cords. LYMPH NODES: Soft left neck adenopathy (2 cm).  No palpable supraclavicular, axillary or inguinal adenopathy  NEUROLOGICAL: Unremarkable. PSYCH:  Appropriate.     Appointment on 12/28/2015  Component Date Value Ref Range Status  . WBC 12/28/2015 11.7* 3.8 - 10.6 K/uL Final  . RBC 12/28/2015 3.53* 4.40 - 5.90 MIL/uL Final  . Hemoglobin 12/28/2015 10.5* 13.0 - 18.0 g/dL Final  . HCT 12/28/2015 29.0* 40.0 - 52.0 % Final  . MCV 12/28/2015 82.2  80.0 - 100.0 fL Final  . MCH 12/28/2015 29.8  26.0 - 34.0 pg Final  . MCHC 12/28/2015 36.2* 32.0 - 36.0 g/dL Final  . RDW 12/28/2015 18.1* 11.5 - 14.5 % Final  . Platelets 12/28/2015 332  150 - 440 K/uL Final  . Neutrophils Relative % 12/28/2015 87  % Final  . Neutro Abs 12/28/2015 10.2* 1.4 - 6.5 K/uL Final  . Lymphocytes Relative 12/28/2015 3  % Final  . Lymphs Abs 12/28/2015 0.3* 1.0 - 3.6 K/uL Final  . Monocytes Relative  12/28/2015 10  % Final  . Monocytes Absolute 12/28/2015 1.1* 0.2 - 1.0 K/uL Final  . Eosinophils Relative 12/28/2015 0  % Final  . Eosinophils Absolute 12/28/2015 0.0  0 - 0.7 K/uL Final  . Basophils Relative 12/28/2015 0  % Final  . Basophils Absolute 12/28/2015 0.0  0 - 0.1 K/uL Final  . Sodium 12/28/2015 124* 135 - 145 mmol/L Final  . Potassium 12/28/2015 3.2* 3.5 - 5.1 mmol/L Final  . Chloride 12/28/2015 88* 101 - 111 mmol/L Final  . CO2 12/28/2015 27  22 - 32 mmol/L Final  . Glucose, Bld 12/28/2015 181* 65 - 99 mg/dL Final  . BUN 12/28/2015 12  6 - 20 mg/dL Final  . Creatinine, Ser 12/28/2015 0.68  0.61 - 1.24 mg/dL Final  . Calcium 12/28/2015 8.2* 8.9 - 10.3 mg/dL Final  . GFR calc non Af Amer 12/28/2015 >60  >60 mL/min Final  . GFR calc Af Amer 12/28/2015 >60  >60 mL/min Final   Comment: (NOTE) The eGFR has been calculated using the CKD EPI equation. This calculation has not been validated in all clinical situations. eGFR's persistently <60 mL/min signify possible Chronic Kidney Disease.   . Anion gap 12/28/2015 9  5 - 15 Final  . Magnesium 12/28/2015 1.5* 1.7 - 2.4 mg/dL Final  Infusion on 12/27/2015  Component Date Value Ref Range Status  . Sodium 12/27/2015 126* 135 - 145 mmol/L Final  . Potassium 12/27/2015 3.0* 3.5 - 5.1 mmol/L Final  . Chloride 12/27/2015 90* 101 - 111 mmol/L Final  . CO2 12/27/2015 24  22 - 32 mmol/L Final  . Glucose, Bld 12/27/2015 124* 65 - 99 mg/dL Final  . BUN 12/27/2015 10  6 - 20 mg/dL Final  . Creatinine, Ser 12/27/2015 0.75  0.61 - 1.24 mg/dL Final  . Calcium 12/27/2015 8.4* 8.9 - 10.3 mg/dL Final  . GFR calc non Af Amer 12/27/2015 >60  >60 mL/min Final  . GFR calc Af Amer 12/27/2015 >60  >60 mL/min Final   Comment: (NOTE) The eGFR has  been calculated using the CKD EPI equation. This calculation has not been validated in all clinical situations. eGFR's persistently <60 mL/min signify possible Chronic Kidney Disease.   . Anion gap  12/27/2015 12  5 - 15 Final  Appointment on 12/26/2015  Component Date Value Ref Range Status  . Sodium 12/26/2015 129* 135 - 145 mmol/L Final  . Potassium 12/26/2015 3.0* 3.5 - 5.1 mmol/L Final  . Chloride 12/26/2015 93* 101 - 111 mmol/L Final  . CO2 12/26/2015 26  22 - 32 mmol/L Final  . Glucose, Bld 12/26/2015 130* 65 - 99 mg/dL Final  . BUN 12/26/2015 12  6 - 20 mg/dL Final  . Creatinine, Ser 12/26/2015 0.75  0.61 - 1.24 mg/dL Final  . Calcium 12/26/2015 8.5* 8.9 - 10.3 mg/dL Final  . GFR calc non Af Amer 12/26/2015 >60  >60 mL/min Final  . GFR calc Af Amer 12/26/2015 >60  >60 mL/min Final   Comment: (NOTE) The eGFR has been calculated using the CKD EPI equation. This calculation has not been validated in all clinical situations. eGFR's persistently <60 mL/min signify possible Chronic Kidney Disease.   . Anion gap 12/26/2015 10  5 - 15 Final  . Magnesium 12/26/2015 1.6* 1.7 - 2.4 mg/dL Final    Assessment:  Kent [name] is a 60 y.o. 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  Tumor was p16 IHC positive (high risk HPV).  CT soft tissue neck on 08/17/2015 revealed a slowly progressive over years, infiltrative, deep 4.1 x 5.4 cm LEFT parotid mass with widespread ipsilateral bulky adenopathy.  PET scan at [facility] on 09/17/2015 revealed left base of tongue malignancy corresponding to asymmetric enhancement identified on the neck CT with hypermetabolic extensive conglomerate left cervical adenopathy (level II-IV) extending from the level of the angle of the mandible down inferiorly to the level of the cricoid cartilage.  There was clustered subcentimeter left level IB submandibular nodes with mild FDG activity.  There was no evidence of metastatic disease.  Alpha gal IgE was 1.21 (< 0.35) c/w IgE antibodies to galactose-alpha-1,3 galactose (oligosaccharide part on Fab portion of the cetuximab heavy chain) and suggestive of potential  cetuximab reaction.  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  Pathologic stage was T4NxM0.  He took imatinib for 1 year.  He is day 25 of cycle #3 cisplatin (10/22/2015 - 12/04/2015) with concurrent radiation.  Radiation completed on 12/21/2015.  Left neck adenopathy has dramatically improved.  He has recurrent hyponatremia. He has received daily IVF with electrolyte supplementation every day during clinic.  Yesterday he left early because of his frustration.  He was admitted overnight to [facility] on 11/15/2015 for hyponatremia (sodium 122).  He received IVF overnight.  He was admitted to [facility] from 12/08/2015 - 12/09/2015 for hydration and electrolyte replacement. Sodium was 126.  He was readmitted to [facility] from 12/09/2015 - 12/12/2015 secondary to nausea, vomiting, and minimal oral intake.  He received IVF with electrolyte replacement and anti-emetics.  A PICC line was placed for continuation of fluids in the outpatient department.  Symptomatically, he continues to drink minimal fluids and eat very little.  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   Plan: 1.  Labs today:  CBC with diff, BMP, Mg. 2.  Discuss admission to hospital because of little oral fluid intake and subsequent worsening hyponatremia  after leaving clinic early yesterday with only 1/3 of his fluids received and upcoming weekend.  He will receive fluids over the weekend then likely return to clinic next week for daily fluids.  Home health care is unavailable to give fluids. 3.  IVF in clinic today until admission arranged. 4.  Consult nutrition. 5.  RTC on 12/31/2015 for MD assessment, labs (BMP, Mg) and continuation of fluids.    Lequita Asal, MD  12/28/2015, 9:05 AM

## 2015-12-28 NOTE — Progress Notes (Signed)
Patient states he is taking his K+ powder and mucinex.  Also states he had and egg, breakfast sausage and sausage gravy for breakfast this morning.  Also states he took his BP medication this morning.  HR 123  BP 116/85.  Overall states he is feeling some better.

## 2015-12-28 NOTE — H&P (Signed)
[place] [facility] [place] NAME: Kent [name]    MR#:  [id number]  DATE OF BIRTH:  24-Nov-1955  DATE OF ADMISSION:  12/28/2015  PRIMARY CARE PHYSICIAN: Kathrine Haddock, NP   REQUESTING/REFERRING PHYSICIAN: Dr. Nolon Stalls  CHIEF COMPLAINT:  No chief complaint on file.   HISTORY OF PRESENT ILLNESS:  Kent [name]  is a 60 y.o. male with a known history of Diabetes, hypertension, hyperlipidemia, arthritis, history of stomach tumor that was removed, active stage IV squamous cell carcinoma of the tongue status post chemoradiation presents to hospital from [place] secondary to dehydration and abnormal labs. Patient already had 2 hospitalizations this month for this same reason. The last hospitalization 2 weeks ago, PICC line was placed so he can get IV fluids at the cancer center. This kept him off hospitalization for the last 2 weeks. Again his intake has been decreasing, he appears weak and labs today reveal low sodium and potassium and magnesium so sent to hospital for further IV fluids. He was also noted to have a gangrenous tip of his right second toe which he was not aware of. Complains of some pain and tenderness, no sensory or motor loss. Denies any nausea or vomiting. Still has somewhat dying aphasia and taking soft foods and liquids at home. No fevers chills, diarrhea or chest pain or difficulty breathing  PAST MEDICAL HISTORY:   Past Medical History:  Diagnosis Date  . Arthritis   . Diabetes mellitus without complication ([place])   . Hyperlipidemia   . Hypertension   . Obesity   . Stomach cancer ([place])    had tumor removed- GIST  . Tongue cancer ([place])    Stage IV squamous cell carcinoma of the base of the tongue/left tonsil    PAST SURGICAL HISTORY:   Past Surgical History:  Procedure Laterality Date  . gastric tumor removed  2010  . tongue mass biopsy      SOCIAL HISTORY:   Social History  Substance Use Topics  . Smoking  status: Never Smoker  . Smokeless tobacco: Never Used  . Alcohol use No    FAMILY HISTORY:   Family History  Problem Relation Age of Onset  . Heart disease Mother   . Hypertension Father   . Cancer Sister     stomach  . Heart disease Brother     MI  . Cancer Daughter   . Cancer Son     testicular  . ADD / ADHD Sister     DRUG ALLERGIES:  No Known Allergies  REVIEW OF SYSTEMS:   Review of Systems  Constitutional: Positive for malaise/fatigue. Negative for chills, fever and weight loss.  HENT: Positive for sore throat. Negative for ear discharge, ear pain, hearing loss and nosebleeds.        Difficulty swallowing  Eyes: Negative for blurred vision, double vision and photophobia.  Respiratory: Negative for cough, hemoptysis, shortness of breath and wheezing.   Cardiovascular: Negative for chest pain, palpitations, orthopnea and leg swelling.  Gastrointestinal: Negative for abdominal pain, constipation, diarrhea, heartburn, melena, nausea and vomiting.  Genitourinary: Negative for dysuria, frequency, hematuria and urgency.  Musculoskeletal: Negative for back pain, myalgias and neck pain.  Skin: Negative for rash.  Neurological: Negative for dizziness, tingling, sensory change, speech change, focal weakness and headaches.  Endo/Heme/Allergies: Does not bruise/bleed easily.  Psychiatric/Behavioral: Negative for depression.    MEDICATIONS AT HOME:   Prior to Admission medications   Medication Sig Start Date  End Date Taking? Authorizing Provider  fluconazole (DIFLUCAN) 100 MG tablet Take 1 tablet (100 mg total) by mouth daily. 12/21/15  Yes Creola Corn, MD  lisinopril (PRINIVIL,ZESTRIL) 20 MG tablet Take 1 tablet (20 mg total) by mouth daily. 11/16/15  Yes Theodoro Grist, MD  magnesium oxide (MAG-OX) 400 (241.3 Mg) MG tablet Take 1 tablet (400 mg total) by mouth daily. 12/12/15  Yes Gladstone Lighter, MD  metFORMIN (GLUCOPHAGE) 1000 MG tablet Take 1 tablet (1,000 mg  total) by mouth 2 (two) times daily with a meal. 04/06/15  Yes Kathrine Haddock, NP  potassium chloride (KLOR-CON) 20 MEQ packet Take 40 mEq by mouth 2 (two) times daily. 12/21/15  Yes Creola Corn, MD  sucralfate (CARAFATE) 1 g tablet Take 1 tablet (1 g total) by mouth 3 (three) times daily. Dissolve in 2 to 3 tbs warm water, swish &swallow 11/01/15  Yes Noreene Filbert, MD  atorvastatin (LIPITOR) 20 MG tablet Take 1 tablet (20 mg total) by mouth daily. Reported on 04/06/2015 04/06/15   Kathrine Haddock, NP  blood glucose meter kit and supplies KIT Dispense based on patient and insurance preference. Use up to four times daily as directed. (FOR ICD-9 250.00, 250.01). 10/02/15   Kathrine Haddock, NP  cyclobenzaprine (FLEXERIL) 10 MG tablet Take 1 tablet (10 mg total) by mouth at bedtime. 04/06/15   Kathrine Haddock, NP  dextromethorphan-guaiFENesin [facility] DM) 30-600 MG 12hr tablet Take 1 tablet by mouth 2 (two) times daily.    Historical Provider, MD  feeding supplement (BOOST / RESOURCE BREEZE) LIQD Take 1 Container by mouth 3 (three) times daily between meals. 12/12/15   Gladstone Lighter, MD  hydrocortisone (CORTEF) 10 MG tablet  12/03/15   Historical Provider, MD  LORazepam (ATIVAN) 0.5 MG tablet Take 1 tablet (0.5 mg total) by mouth every 6 (six) hours as needed (Nausea or vomiting). 10/22/15   Lequita Asal, MD  magic mouthwash SOLN Take 5 mLs by mouth 4 (four) times daily as needed for mouth pain. 12/03/15   Lequita Asal, MD  meloxicam (MOBIC) 15 MG tablet Take 1 tablet (15 mg total) by mouth daily. 05/07/15   Charline Bills Cuthriell, PA-C  menthol-cetylpyridinium (CEPACOL) 3 MG lozenge Take 1 lozenge (3 mg total) by mouth as needed for sore throat. 11/16/15   Theodoro Grist, MD  ondansetron (ZOFRAN) 8 MG tablet Take 1 tablet (8 mg total) by mouth 2 (two) times daily as needed. Start on the third day after chemotherapy. 10/22/15   Lequita Asal, MD  oxyCODONE-acetaminophen (PERCOCET/ROXICET) 5-325 MG tablet  Take 1 tablet by mouth every 6 (six) hours as needed for severe pain. 12/18/15   Lequita Asal, MD  prochlorperazine (COMPAZINE) 10 MG tablet Take 1 tablet (10 mg total) by mouth every 6 (six) hours as needed (Nausea or vomiting). 11/20/15   Lequita Asal, MD  sodium chloride 1 g tablet Take 1 tablet (1 g total) by mouth 2 (two) times daily with a meal. 12/12/15   Gladstone Lighter, MD  UNIFINE PENTIPS 31G X 6 MM MISC 4 (four) times daily. 10/01/15   Historical Provider, MD      VITAL SIGNS:  Blood pressure 138/72, pulse 97, temperature 98.7 F (37.1 C), temperature source Oral, resp. rate 18, SpO2 100 %.  PHYSICAL EXAMINATION:   Physical Exam  GENERAL:  60 y.o.-year-old patient lying in the bed with no acute distress. Disheveled appearing EYES: Pupils equal, round, reactive to light and accommodation. No scleral icterus. Extraocular muscles intact.  HEENT: Head atraumatic, normocephalic. Oropharynx and nasopharynx clear. Improving soft palate ulcerations noted when compared to last admission. NECK:  Supple, no jugular venous distention. No thyroid enlargement, no tenderness. Cervical lymph nodes palpable especially on the left side LUNGS: Normal breath sounds bilaterally, no wheezing, rales,rhonchi or crepitation. No use of accessory muscles of respiration.  CARDIOVASCULAR: S1, S2 normal. No murmurs, rubs, or gallops.  ABDOMEN: Soft, nontender, nondistended. Bowel sounds present. No organomegaly or mass.  EXTREMITIES: No pedal edema, cyanosis, or clubbing.  Right foot second toe tip is gangrenous and well demarcated after the first phalanx. NEUROLOGIC: Cranial nerves II through XII are intact. Muscle strength 5/5 in all extremities. Sensation intact. Gait not checked.  PSYCHIATRIC: The patient is alert and oriented x 3.  SKIN: No obvious rash, lesion, or ulcer.   LABORATORY PANEL:   CBC  Recent Labs Lab 12/28/15 0810  WBC 11.7*  HGB 10.5*  HCT 29.0*  PLT 332    ------------------------------------------------------------------------------------------------------------------  Chemistries   Recent Labs Lab 12/24/15 0920  12/28/15 0810  NA 127*  < > 124*  K 2.8*  < > 3.2*  CL 90*  < > 88*  CO2 26  < > 27  GLUCOSE 139*  < > 181*  BUN 22*  < > 12  CREATININE 0.70  < > 0.68  CALCIUM 8.6*  < > 8.2*  MG 1.5*  < > 1.5*  AST 17  --   --   ALT 19  --   --   ALKPHOS 82  --   --   BILITOT 0.9  --   --   < > = values in this interval not displayed. ------------------------------------------------------------------------------------------------------------------  Cardiac Enzymes No results for input(s): TROPONINI in the last 168 hours. ------------------------------------------------------------------------------------------------------------------  RADIOLOGY:  No results found.  EKG:   Orders placed or performed during the hospital encounter of 12/09/15  . ED EKG  . ED EKG    IMPRESSION AND PLAN:   Kent [name]  is a 60 y.o. male with a known history of Diabetes, hypertension, hyperlipidemia, arthritis, history of stomach tumor that was removed, active stage IV squamous cell carcinoma of the tongue status post chemoradiation presents to hospital from [place] secondary to dehydration and abnormal labs.  #1 hyponatremia with hypokalemia and hypomagnesemia-has a PICC line. Continue IV and oral supplements with fluids. Monitor  #2 gangrenous right second toe-well demarcated at this time. Good palpable pulses noted. We'll start Plavix and consult vascular  #3 diabetes mellitus-due to poor oral intake, Lantus was discontinued at discharge last admission. Continue metformin and also sliding scale insulin  #4 squamous cell carcinoma of the tongue/tonsil-finished radiation. Follows with oncology. We'll consult in the hospital.  #5 hypertension-lisinopril  #6 GERD-add Protonix. Already on sucralfate  #7 DVT prophylaxis-on  Lovenox    All the records are reviewed and case discussed with ED provider. Management plans discussed with the patient, family and they are in agreement.  CODE STATUS: Full code  TOTAL TIME TAKING CARE OF THIS PATIENT: 50 minutes.    Gladstone Lighter M.D on 12/28/2015 at 1:52 PM  Between 7am to 6pm - Pager - [phone]  After 6pm go to [email or web address] - password [email or web address]  Office  [phone]  CC: Primary care physician; Kathrine Haddock, NP

## 2015-12-28 NOTE — Consult Note (Addendum)
[facility] Vascular Consult Note  MRN : [id number]  Kent [name] is a 60 y.o. ([birth year]) male who presents with chief complaint of No chief complaint on file. Marland Kitchen  History of Present Illness: I am asked by Dr. Tressia Miners to see the patient regarding a gangrenous right second toe.  He is not sure when this started and it is not that painful to him.  He has no fever or chills.  He has no drainage from the toe.  No previous vascular history to his knowledge.  Admitted by oncology with head and neck cancer and hyponatremia.  He denies claudication or ischemic rest pain symptoms.  No left leg symptoms.  No clear trauma, injury, or inciting events that caused the gangrenous toe.    Current Facility-Administered Medications  Medication Dose Route Frequency Provider Last Rate Last Dose  . 0.9 % NaCl with KCl 20 mEq/ L  infusion   Intravenous Continuous Gladstone Lighter, MD 100 mL/hr at 12/28/15 1428    . acetaminophen (TYLENOL) tablet 650 mg  650 mg Oral Q6H PRN Gladstone Lighter, MD       Or  . acetaminophen (TYLENOL) suppository 650 mg  650 mg Rectal Q6H PRN Gladstone Lighter, MD      . atorvastatin (LIPITOR) tablet 20 mg  20 mg Oral q1800 Gladstone Lighter, MD      . chlorhexidine (PERIDEX) 0.12 % solution 15 mL  15 mL Mouth Rinse BID Gladstone Lighter, MD   15 mL at 12/28/15 1431  . clopidogrel (PLAVIX) tablet 75 mg  75 mg Oral Daily Gladstone Lighter, MD   75 mg at 12/28/15 1435  . cyclobenzaprine (FLEXERIL) tablet 10 mg  10 mg Oral QHS Gladstone Lighter, MD      . guaiFENesin (MUCINEX) 12 hr tablet 600 mg  600 mg Oral BID Gladstone Lighter, MD   600 mg at 12/28/15 1435   And  . dextromethorphan (DELSYM) 30 MG/5ML liquid 30 mg  30 mg Oral BID Gladstone Lighter, MD   30 mg at 12/28/15 1435  . enoxaparin (LOVENOX) injection 40 mg  40 mg Subcutaneous Q24H Gladstone Lighter, MD      . feeding supplement (BOOST / RESOURCE BREEZE) liquid 1 Container  1 Container Oral TID BM  Gladstone Lighter, MD   1 Container at 12/28/15 1435  . fluconazole (DIFLUCAN) tablet 100 mg  100 mg Oral Daily Gladstone Lighter, MD   100 mg at 12/28/15 1435  . insulin aspart (novoLOG) injection 0-5 Units  0-5 Units Subcutaneous QHS Gladstone Lighter, MD      . insulin aspart (novoLOG) injection 0-9 Units  0-9 Units Subcutaneous TID WC Gladstone Lighter, MD      . Derrill Memo ON 12/29/2015] lisinopril (PRINIVIL,ZESTRIL) tablet 20 mg  20 mg Oral Daily Gladstone Lighter, MD      . LORazepam (ATIVAN) tablet 0.5 mg  0.5 mg Oral Q6H PRN Gladstone Lighter, MD      . magic mouthwash  5 mL Oral QID PRN Gladstone Lighter, MD      . magnesium oxide (MAG-OX) tablet 400 mg  400 mg Oral Daily Gladstone Lighter, MD   400 mg at 12/28/15 1435  . meloxicam (MOBIC) tablet 15 mg  15 mg Oral Daily Gladstone Lighter, MD   15 mg at 12/28/15 1435  . menthol-cetylpyridinium (CEPACOL) lozenge 3 mg  1 lozenge Oral PRN Gladstone Lighter, MD      . metFORMIN (GLUCOPHAGE) tablet 1,000 mg  1,000 mg Oral BID WC Radhika  Tressia Miners, MD      . ondansetron [facility]) tablet 4 mg  4 mg Oral Q6H PRN Gladstone Lighter, MD       Or  . ondansetron (ZOFRAN) injection 4 mg  4 mg Intravenous Q6H PRN Gladstone Lighter, MD      . oxyCODONE-acetaminophen (PERCOCET/ROXICET) 5-325 MG per tablet 1 tablet  1 tablet Oral Q6H PRN Gladstone Lighter, MD      . pantoprazole (PROTONIX) EC tablet 40 mg  40 mg Oral Daily Gladstone Lighter, MD   40 mg at 12/28/15 1435  . potassium chloride (KLOR-CON) packet 40 mEq  40 mEq Oral BID Gladstone Lighter, MD      . sodium chloride tablet 1 g  1 g Oral BID WC Gladstone Lighter, MD      . sucralfate (CARAFATE) tablet 1 g  1 g Oral TID Gladstone Lighter, MD   1 g at 12/28/15 1435    Past Medical History:  Diagnosis Date  . Arthritis   . Diabetes mellitus without complication ([place])   . Hyperlipidemia   . Hypertension   . Obesity   . Stomach cancer ([place])    had tumor removed- [facility]  . 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 gastric tumor removed  2010  . tongue mass biopsy      Social History Previous tobacco use. No ETOH abuse No IVDU  Family History Family History  Problem Relation Age of Onset  . Heart disease Mother   . Hypertension Father   . Cancer Sister     stomach  . Heart disease Brother     MI  . Cancer Daughter   . Cancer Son     testicular  . ADD / ADHD Sister     No Known Allergies   REVIEW OF SYSTEMS (Negative unless checked)  Constitutional: '[]'$ Weight loss  '[]'$ Fever  '[]'$ Chills Cardiac: '[]'$ Chest pain   '[]'$ Chest pressure   '[]'$ Palpitations   '[]'$ Shortness of breath when laying flat   '[]'$ Shortness of breath at rest   '[]'$ Shortness of breath with exertion. Vascular:  '[]'$ Pain in legs with walking   '[]'$ Pain in legs at rest   '[]'$ Pain in legs when laying flat   '[]'$ Claudication   '[]'$ Pain in feet when walking  '[]'$ Pain in feet at rest  '[]'$ Pain in feet when laying flat   '[]'$ History of DVT   '[]'$ Phlebitis   '[]'$ Swelling in legs   '[]'$ Varicose veins   '[x]'$ Non-healing ulcers Pulmonary:   '[]'$ Uses home oxygen   '[]'$ Productive cough   '[]'$ Hemoptysis   '[]'$ Wheeze  '[]'$ COPD   '[]'$ Asthma Neurologic:  '[]'$ Dizziness  '[]'$ Blackouts   '[]'$ Seizures   '[]'$ History of stroke   '[]'$ History of TIA  '[]'$ Aphasia   '[]'$ Temporary blindness   '[]'$ Dysphagia   '[]'$ Weakness or numbness in arms   '[]'$ Weakness or numbness in legs Musculoskeletal:  '[]'$ Arthritis   '[]'$ Joint swelling   '[]'$ Joint pain   '[]'$ Low back pain Hematologic:  '[]'$ Easy bruising  '[]'$ Easy bleeding   '[]'$ Hypercoagulable state   '[x]'$ Anemic  '[]'$ Hepatitis Gastrointestinal:  '[]'$ Blood in stool   '[]'$ Vomiting blood  '[]'$ Gastroesophageal reflux/heartburn   '[]'$ Difficulty swallowing. Genitourinary:  '[]'$ Chronic kidney disease   '[]'$ Difficult urination  '[]'$ Frequent urination  '[]'$ Burning with urination   '[]'$ Blood in urine Skin:  '[]'$ Rashes   '[]'$ Ulcers   '[]'$ Wounds Psychological:  '[]'$ History of anxiety   '[]'$  History of major  depression.  Physical Examination  Vitals:  12/28/15 1238  BP: 138/72  Pulse: 97  Resp: 18  Temp: 98.7 F (37.1 C)  TempSrc: Oral  SpO2: 100%   There is no height or weight on file to calculate BMI. Gen:  WD/WN, NAD Head: /AT, No temporalis wasting. Prominent temp pulse not noted. Ear/Nose/Throat: Hearing grossly intact, nares w/o erythema or drainage, oropharynx w/o Erythema/Exudate Eyes: Sclera non-icteric, conjunctiva clear Neck: Trachea midline.  No JVD.  Pulmonary:  Good air movement, respirations not labored, equal bilaterally.  Cardiac: RRR, normal S1, S2. Vascular:  Vessel Right Left  Radial Palpable Palpable  Ulnar Palpable Palpable  Brachial Palpable Palpable  Carotid Palpable, without bruit Palpable, without bruit  Aorta Not palpable N/A  Femoral Palpable Palpable  Popliteal Palpable Palpable  PT Trace Palpable 1+ Palpable  DP 1+ Palpable 2+ Palpable   Gastrointestinal: soft, non-tender/non-distended. No guarding/reflex.  Musculoskeletal: M/S 5/5 throughout.  Gangrenous tip of the right second toe.  Dry. No erythema or drainage. No edema. Neurologic: Sensation grossly intact in extremities.  Symmetrical.  Speech is fluent. Motor exam as listed above. Psychiatric: Judgment intact, Mood & affect appropriate for pt's clinical situation. Dermatologic: gangrenous changes to right second toe as above Lymph : No Cervical, Axillary, or Inguinal lymphadenopathy.      CBC Lab Results  Component Value Date   WBC 11.7 (H) 12/28/2015   HGB 10.5 (L) 12/28/2015   HCT 29.0 (L) 12/28/2015   MCV 82.2 12/28/2015   PLT 332 12/28/2015    BMET    Component Value Date/Time   NA 124 (L) 12/28/2015 0810   NA 142 04/06/2015 1318   NA 127 (L) 02/14/2013 1204   K 3.2 (L) 12/28/2015 0810   K 3.8 02/14/2013 1204   CL 88 (L) 12/28/2015 0810   CL 93 (L) 02/14/2013 1204   CO2 27 12/28/2015 0810   CO2 28 02/14/2013 1204   GLUCOSE 181 (H) 12/28/2015 0810   GLUCOSE 420  (H) 02/14/2013 1204   BUN 12 12/28/2015 0810   BUN 18 04/06/2015 1318   BUN 21 (H) 02/14/2013 1204   CREATININE 0.68 12/28/2015 0810   CREATININE 1.63 (H) 02/14/2013 1204   CALCIUM 8.2 (L) 12/28/2015 0810   CALCIUM 8.8 02/14/2013 1204   GFRNONAA >60 12/28/2015 0810   GFRNONAA 46 (L) 02/14/2013 1204   GFRAA >60 12/28/2015 0810   GFRAA 53 (L) 02/14/2013 1204   Estimated Creatinine Clearance: 90.7 mL/min (by C-G formula based on SCr of 0.68 mg/dL).  COAG No results found for: INR, PROTIME  Radiology Dg Chest Port 1 View  Result Date: 12/11/2015 CLINICAL DATA:  PICC placement. EXAM: PORTABLE CHEST 1 VIEW COMPARISON:  12/10/2015 FINDINGS: Tip of the right upper extremity PICC in the mid distal SVC. No pneumothorax. Lungs are clear. Unchanged cardiomediastinal contours. No pleural fluid or focal airspace disease. IMPRESSION: Tip of the right upper extremity PICC in the mid distal SVC. Electronically Signed   By: Jeb Levering M.D.   On: 12/11/2015 21:11   Dg Chest Port 1 View  Result Date: 12/10/2015 CLINICAL DATA:  Salivary gland cancer. Chemotherapy. Vomiting and coughing. EXAM: PORTABLE CHEST 1 VIEW COMPARISON:  Chest radiograph 02/14/2013 FINDINGS: Lungs are well inflated. Cardiomediastinal contours are normal. No pleural effusion or pneumothorax. No focal airspace consolidation or pulmonary edema. IMPRESSION: Clear lungs. Electronically Signed   By: Ulyses Jarred M.D.   On: 12/10/2015 02:15   Nm Outside Films Head/face  Result Date: 12/17/2015 This examination belongs to an outside facility and is stored  here for comparison purposes only.  Contact the originating outside institution for any associated report or interpretation.     Assessment/Plan 1. Gangrene right second toe.  Likely PAD and possible atheroembolic disease.  Discussed use of antiplatelets.  Should also be on a statin. He is already on Plavix and Lipitor. Would recommend angiogram for further evaluation and  possible treatment and this has been scheduled for Monday.  Risks and benefits discussed and he is agreeable to proceed. This is a critical and limb threatening situation and he is expected to lose at least part of the right second toe.  Perfusion important to keep this at a distal level and not more proximal. 2. DM. blood glucose control important in reducing the progression of atherosclerotic disease. Also, involved in wound healing. On appropriate medications. 3. HTN. blood pressure control important in reducing the progression of atherosclerotic disease. On appropriate oral medications. 4. Head and neck cancer. Oncology seeing patient 5. Hyponatremia. Management per primary service.  Hydration of benefit for renal protection from angiogram.   Leotis Pain, MD  12/28/2015 4:49 PM    This note was created with Dragon medical transcription system.  Any error is purely unintentional

## 2015-12-28 NOTE — Progress Notes (Signed)
Initial Nutrition Assessment  DOCUMENTATION CODES:   Severe malnutrition in context of acute illness/injury  INTERVENTION:  -Discussed Head and Neck Cancer Nutrition Therapy, handout provided and how to manage signs and symptoms of treatment side effects. -Encouraged intake of boost breeze and will add carnation instant breakfast TID, and magic cup TID for pt to try. -Pt would benefit from PEG tube placement secondary to not meeting nutritional needs orally and 21% wt loss in the last month.   NUTRITION DIAGNOSIS:   Malnutrition related to cancer and cancer related treatments, acute illness as evidenced by percent weight loss, energy intake < or equal to 50% for > or equal to 5 days.    GOAL:   Patient will meet greater than or equal to 90% of their needs    MONITOR:   PO intake, Supplement acceptance, Weight trends  REASON FOR ASSESSMENT:   Consult Assessment of nutrition requirement/status  ASSESSMENT:      60 y.o male admitted with stage IVB squamous cell carcinoma of the base of tongue/left tonsil currently on cisplatin and radiation. Noted pt has not been taking electrolyte supplements, having to come into clinic daily for IV fluids but wanting to leave early before infusion complete.  Recent admission on 10/8  Pt with history of DM, HLD, HTN, stomach cancer had tumor removed (2010)  Pt taking few bites of applesauce on visit entry this pm, taking sips of tea. Reports "it burns when I swallow." Reports ate some eggs, with gravy and sausage last night. Reports he is drinking tea and pepsi, drinking chicken and beef broth. Drank some boost (milky) for the first time yesterday.  Pt reports no nausea or vomiting.  Noted wt in 11/15/2015 of 205 pounds, current wt of 169 pounds (21% wt loss in the last month)  Medications reviewed: diflucan, aspart, Mag ox, Mag sulfate, KCL, carafate, NS with KCl at 110m/hr, magic mouthwash  Labs reviewed: Na 124, K 3.2, glucose 181,  Mag 1.5   Nutrition-Focused physical exam completed. Findings are moderate fat depletion, moderate muscle depletion, and no edema.     Diet Order:  DIET SOFT Room service appropriate? Yes; Fluid consistency: Thin  Skin:  Reviewed, no issues  Last BM:  PTA  Height:   Ht Readings from Last 1 Encounters:  12/10/15 '5\' 7"'$  (1.702 m)    Weight:   Wt Readings from Last 1 Encounters:  12/28/15 169 lb 1.5 oz (76.7 kg)    Ideal Body Weight:     BMI:  There is no height or weight on file to calculate BMI.  Estimated Nutritional Needs:   Kcal:  21610-[address]604kcals/d  Protein:  92-115 g/d  Fluid:  >/= 2.3 L/d  EDUCATION NEEDS:   Education needs addressed  Tristan Proto B. AZenia Resides RArena LLa Grange(pager) Weekend/On-Call pager [phone]

## 2015-12-28 NOTE — Progress Notes (Signed)
Called report earlier in the day to Kent [name] about his tonsil cancer, with hyponatremia, hypo mag. Hypokalemia. We have been seeing him M-f to get fluids with electrolytes every day and we could not find home health to take p;ain fluid order for weekends.  Pt not eating hardly anything, throat is raw, coughing up lots of sputum. Pt non complaint with he meds at home.  He has thrush and was given diflucan and he did not even pick it up from pharmacy til I called his wife to tell her that he needed it and the pt never said anything to her.  Gave lab values for today and told her he had PICC line in right arm and he had 1000 ml ns with 20 kcl infusing at 250 hour. Called  [facility] to transport him.

## 2015-12-28 NOTE — Progress Notes (Signed)
Patient admitted to 1C.

## 2015-12-29 ENCOUNTER — Encounter: Payer: Self-pay | Admitting: Hematology and Oncology

## 2015-12-29 DIAGNOSIS — Z9221 Personal history of antineoplastic chemotherapy: Secondary | ICD-10-CM

## 2015-12-29 DIAGNOSIS — R112 Nausea with vomiting, unspecified: Secondary | ICD-10-CM

## 2015-12-29 DIAGNOSIS — R630 Anorexia: Secondary | ICD-10-CM

## 2015-12-29 DIAGNOSIS — R109 Unspecified abdominal pain: Secondary | ICD-10-CM

## 2015-12-29 DIAGNOSIS — E871 Hypo-osmolality and hyponatremia: Principal | ICD-10-CM

## 2015-12-29 DIAGNOSIS — Z809 Family history of malignant neoplasm, unspecified: Secondary | ICD-10-CM

## 2015-12-29 DIAGNOSIS — C01 Malignant neoplasm of base of tongue: Secondary | ICD-10-CM

## 2015-12-29 DIAGNOSIS — E669 Obesity, unspecified: Secondary | ICD-10-CM

## 2015-12-29 DIAGNOSIS — E860 Dehydration: Secondary | ICD-10-CM

## 2015-12-29 DIAGNOSIS — M129 Arthropathy, unspecified: Secondary | ICD-10-CM

## 2015-12-29 DIAGNOSIS — E785 Hyperlipidemia, unspecified: Secondary | ICD-10-CM

## 2015-12-29 DIAGNOSIS — E1152 Type 2 diabetes mellitus with diabetic peripheral angiopathy with gangrene: Secondary | ICD-10-CM

## 2015-12-29 DIAGNOSIS — Z923 Personal history of irradiation: Secondary | ICD-10-CM

## 2015-12-29 DIAGNOSIS — I96 Gangrene, not elsewhere classified: Secondary | ICD-10-CM

## 2015-12-29 LAB — GLUCOSE, CAPILLARY
GLUCOSE-CAPILLARY: 107 mg/dL — AB (ref 65–99)
Glucose-Capillary: 110 mg/dL — ABNORMAL HIGH (ref 65–99)
Glucose-Capillary: 97 mg/dL (ref 65–99)
Glucose-Capillary: 93 mg/dL (ref 65–99)

## 2015-12-29 LAB — BASIC METABOLIC PANEL
Anion gap: 7 (ref 5–15)
BUN: 6 mg/dL (ref 6–20)
CALCIUM: 7.9 mg/dL — AB (ref 8.9–10.3)
CO2: 27 mmol/L (ref 22–32)
CREATININE: 0.66 mg/dL (ref 0.61–1.24)
Chloride: 95 mmol/L — ABNORMAL LOW (ref 101–111)
GFR calc non Af Amer: >60 mL/min (ref >60)
Glucose, Bld: 104 mg/dL — ABNORMAL HIGH (ref 65–99)
Potassium: 3.2 mmol/L — ABNORMAL LOW (ref 3.5–5.1)
SODIUM: 129 mmol/L — AB (ref 135–145)

## 2015-12-29 LAB — CBC
HCT: 26.6 % — ABNORMAL LOW (ref 40.0–52.0)
Hemoglobin: 9.6 g/dL — ABNORMAL LOW (ref 13.0–18.0)
MCH: 30 pg (ref 26.0–34.0)
MCHC: 36 g/dL (ref 32.0–36.0)
MCV: 83.3 fL (ref 80.0–100.0)
PLATELETS: 214 10*3/uL (ref 150–440)
RBC: 3.19 MIL/uL — AB (ref 4.40–5.90)
RDW: 18.4 % — AB (ref 11.5–14.5)
WBC: 9 10*3/uL (ref 3.8–10.6)

## 2015-12-29 LAB — POCT CBG (FASTING - GLUCOSE)-MANUAL ENTRY: GLUCOSE FASTING, POC: 110 mg/dL — AB (ref 70–99)

## 2015-12-29 MED ORDER — LIDOCAINE VISCOUS 2 % MT SOLN
15.0000 mL | OROMUCOSAL | Status: DC | PRN
  Administered 2015-12-29 – 2015-12-31 (×7): 15 mL via OROMUCOSAL
  Filled 2015-12-29 (×8): qty 15

## 2015-12-29 MED ORDER — GUAIFENESIN ER 600 MG PO TB12
600.0000 mg | ORAL_TABLET | Freq: Two times a day (BID) | ORAL | Status: DC
  Administered 2015-12-29 – 2015-12-30 (×3): 600 mg via ORAL
  Filled 2015-12-29 (×3): qty 1

## 2015-12-29 MED ORDER — POTASSIUM CHLORIDE CRYS ER 20 MEQ PO TBCR
40.0000 meq | EXTENDED_RELEASE_TABLET | Freq: Two times a day (BID) | ORAL | Status: DC
  Administered 2015-12-29 – 2015-12-30 (×4): 40 meq via ORAL
  Filled 2015-12-29 (×4): qty 2

## 2015-12-29 MED ORDER — DEXTROMETHORPHAN POLISTIREX ER 30 MG/5ML PO SUER
30.0000 mg | Freq: Two times a day (BID) | ORAL | Status: DC
  Administered 2015-12-29 – 2015-12-30 (×3): 30 mg via ORAL
  Filled 2015-12-29 (×5): qty 5

## 2015-12-29 NOTE — Progress Notes (Signed)
[facility]  Telephone:([phone]) [phone] [phone]) [phone]  ID: Kent [name] OB: 1955/07/18  MR#: [id number]  UKG#:[id number]  Patient Care Team: Kathrine Haddock, NP as PCP - General (Nurse Practitioner)  CHIEF COMPLAINT: Stage IV P squamous cell carcinoma of the base of tongue, gangrenous right second toe.  INTERVAL HISTORY: Patient feels improved today although continues to have minimal PO intake. He denies any pain. Patient otherwise feels well and offers no further specific complaints.  REVIEW OF SYSTEMS:   Review of Systems  Constitutional: Negative.  Negative for fever, malaise/fatigue and weight loss.  Respiratory: Negative.  Negative for shortness of breath.   Cardiovascular: Negative.  Negative for chest pain and leg swelling.  Gastrointestinal: Positive for abdominal pain, nausea and vomiting.  Musculoskeletal: Negative for joint pain.  Neurological: Negative.  Negative for weakness.  Psychiatric/Behavioral: Negative.     As per HPI. Otherwise, a complete review of systems is negative.  PAST MEDICAL HISTORY: Past Medical History:  Diagnosis Date  . Arthritis   . Diabetes mellitus without complication ([place])   . Hyperlipidemia   . Hypertension   . Obesity   . Stomach cancer ([place])    had tumor removed- [facility]  . 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 gastric tumor removed  2010  . tongue mass biopsy      FAMILY HISTORY: Family History  Problem Relation Age of Onset  . Heart disease Mother   . Hypertension Father   . Cancer Sister     stomach  . Heart disease Brother     MI  . Cancer Daughter   . Cancer Son     testicular  . ADD / ADHD Sister     ADVANCED DIRECTIVES (Y/N):  '@ADVDIR'$ @  HEALTH MAINTENANCE: Social History  Substance Use Topics  . Smoking status: Never Smoker  . Smokeless tobacco: Never  Used  . Alcohol use No     Colonoscopy:  PAP:  Bone density:  Lipid panel:  No Known Allergies  Current Facility-Administered Medications  Medication Dose Route Frequency Provider Last Rate Last Dose  . 0.9 % NaCl with KCl 20 mEq/ L  infusion   Intravenous Continuous Gladstone Lighter, MD 100 mL/hr at 12/28/15 1428    . acetaminophen (TYLENOL) tablet 650 mg  650 mg Oral Q6H PRN Gladstone Lighter, MD       Or  . acetaminophen (TYLENOL) suppository 650 mg  650 mg Rectal Q6H PRN Gladstone Lighter, MD      . atorvastatin (LIPITOR) tablet 20 mg  20 mg Oral q1800 Gladstone Lighter, MD   20 mg at 12/28/15 1718  . chlorhexidine (PERIDEX) 0.12 % solution 15 mL  15 mL Mouth Rinse BID Gladstone Lighter, MD   15 mL at 12/29/15 0832  . clopidogrel (PLAVIX) tablet 75 mg  75 mg Oral Daily Gladstone Lighter, MD   75 mg at 12/29/15 0834  . cyclobenzaprine (FLEXERIL) tablet 10 mg  10 mg Oral QHS Gladstone Lighter, MD      . guaiFENesin (MUCINEX) 12 hr tablet 600 mg  600 mg Oral BID Gladstone Lighter, MD   600 mg at 12/29/15 7628   And  . dextromethorphan (DELSYM) 30 MG/5ML liquid 30 mg  30 mg Oral BID Gladstone Lighter, MD   30 mg at 12/29/15 3151  . enoxaparin (LOVENOX) injection 40 mg  40 mg Subcutaneous Q24H Gladstone Lighter, MD   40 mg at 12/28/15 2016  . feeding supplement (BOOST / RESOURCE BREEZE) liquid 1 Container  1 Container Oral TID BM Gladstone Lighter, MD   1 Container at 12/29/15 1034  . fluconazole (DIFLUCAN) tablet 100 mg  100 mg Oral Daily Gladstone Lighter, MD   100 mg at 12/29/15 0837  . insulin aspart (novoLOG) injection 0-5 Units  0-5 Units Subcutaneous QHS Gladstone Lighter, MD      . insulin aspart (novoLOG) injection 0-9 Units  0-9 Units Subcutaneous TID WC Gladstone Lighter, MD   2 Units at 12/28/15 1718  . lidocaine (XYLOCAINE) 2 % viscous mouth solution 15 mL  15 mL Mouth/Throat Q4H PRN Hillary Bow, MD   15 mL at 12/29/15 0832  . lisinopril (PRINIVIL,ZESTRIL) tablet 20 mg   20 mg Oral Daily Gladstone Lighter, MD   20 mg at 12/29/15 0836  . LORazepam (ATIVAN) tablet 0.5 mg  0.5 mg Oral Q6H PRN Gladstone Lighter, MD      . magic mouthwash  5 mL Oral QID PRN Gladstone Lighter, MD      . magnesium oxide (MAG-OX) tablet 400 mg  400 mg Oral Daily Gladstone Lighter, MD   400 mg at 12/29/15 0835  . meloxicam (MOBIC) tablet 15 mg  15 mg Oral Daily Gladstone Lighter, MD   15 mg at 12/29/15 3536  . menthol-cetylpyridinium (CEPACOL) lozenge 3 mg  1 lozenge Oral PRN Gladstone Lighter, MD      . metFORMIN (GLUCOPHAGE) tablet 1,000 mg  1,000 mg Oral BID WC Gladstone Lighter, MD   1,000 mg at 12/29/15 0836  . ondansetron (ZOFRAN) tablet 4 mg  4 mg Oral Q6H PRN Gladstone Lighter, MD       Or  . ondansetron (ZOFRAN) injection 4 mg  4 mg Intravenous Q6H PRN Gladstone Lighter, MD      . oxyCODONE-acetaminophen (PERCOCET/ROXICET) 5-325 MG per tablet 1 tablet  1 tablet Oral Q6H PRN Gladstone Lighter, MD      . pantoprazole (PROTONIX) EC tablet 40 mg  40 mg Oral Daily Gladstone Lighter, MD   40 mg at 12/29/15 1443  . potassium chloride SA (K-DUR,KLOR-CON) CR tablet 40 mEq  40 mEq Oral BID Hillary Bow, MD   40 mEq at 12/29/15 0835  . sodium chloride tablet 1 g  1 g Oral BID WC Gladstone Lighter, MD   1 g at 12/29/15 0835  . sucralfate (CARAFATE) tablet 1 g  1 g Oral TID Gladstone Lighter, MD   1 g at 12/29/15 0837    OBJECTIVE: Vitals:   12/29/15 0442 12/29/15 0833  BP: (!) 144/93 (!) 134/93  Pulse: 82 88  Resp:    Temp: 98.3 F (36.8 C)      Body mass index is 26.11 kg/m.    ECOG FS:0 - Asymptomatic  General: Well-developed, well-nourished, no acute distress. Eyes: Pink conjunctiva, anicteric sclera. HEENT: Normocephalic, moist mucous membranes, clear oropharnyx. Lungs: Clear to auscultation bilaterally. Heart: Regular rate and rhythm. No rubs, murmurs, or gallops. Abdomen: Soft, nontender, nondistended. No organomegaly noted, normoactive bowel sounds. Musculoskeletal: No  edema, cyanosis, or clubbing. Right second toe appears gangrenous. Neuro: Alert, answering all questions appropriately. Cranial nerves grossly intact. Skin: No rashes or petechiae noted. Psych: Normal affect.   LAB RESULTS:  Lab Results  Component Value Date   NA 129 (L) 12/29/2015   K 3.2 (L) 12/29/2015   CL 95 (L) 12/29/2015   CO2 27 12/29/2015  GLUCOSE 104 (H) 12/29/2015   BUN 6 12/29/2015   CREATININE 0.66 12/29/2015   CALCIUM 7.9 (L) 12/29/2015   PROT 6.3 (L) 12/24/2015   ALBUMIN 3.3 (L) 12/24/2015   AST 17 12/24/2015   ALT 19 12/24/2015   ALKPHOS 82 12/24/2015   BILITOT 0.9 12/24/2015   GFRNONAA >60 12/29/2015   GFRAA >60 12/29/2015    Lab Results  Component Value Date   WBC 9.0 12/29/2015   NEUTROABS 10.2 (H) 12/28/2015   HGB 9.6 (L) 12/29/2015   HCT 26.6 (L) 12/29/2015   MCV 83.3 12/29/2015   PLT 214 12/29/2015     STUDIES: Dg Chest Port 1 View  Result Date: 12/11/2015 CLINICAL DATA:  PICC placement. EXAM: PORTABLE CHEST 1 VIEW COMPARISON:  12/10/2015 FINDINGS: Tip of the right upper extremity PICC in the mid distal SVC. No pneumothorax. Lungs are clear. Unchanged cardiomediastinal contours. No pleural fluid or focal airspace disease. IMPRESSION: Tip of the right upper extremity PICC in the mid distal SVC. Electronically Signed   By: Jeb Levering M.D.   On: 12/11/2015 21:11   Dg Chest Port 1 View  Result Date: 12/10/2015 CLINICAL DATA:  Salivary gland cancer. Chemotherapy. Vomiting and coughing. EXAM: PORTABLE CHEST 1 VIEW COMPARISON:  Chest radiograph 02/14/2013 FINDINGS: Lungs are well inflated. Cardiomediastinal contours are normal. No pleural effusion or pneumothorax. No focal airspace consolidation or pulmonary edema. IMPRESSION: Clear lungs. Electronically Signed   By: Ulyses Jarred M.D.   On: 12/10/2015 02:15   Nm Outside Films Head/face  Result Date: 12/17/2015 This examination belongs to an outside facility and is stored here for comparison  purposes only.  Contact the originating outside institution for any associated report or interpretation.   Assessment:  The patient is a 60 y.o.  gentleman with clinical stage IVB (T2N3M0) base of tongue squamous cell carcinomacurrently s/p cycle #3 high dose cisplatin with concurrent radiation.  He ws admitted with recurrent dehydration and hyponatremia secondary to poor oral intake. He was also noted to have a right gangrenous second toe. When he does not receive fluids, he deteriorates secondary to his minimal oral intake.  Plan:   1.  Oncology:  Day 32 s/p last cycle of chemotherapy.  Radiation is complete.  Patient has mucositis and significant thick secretions in posterior pharynx from radiation which prevent him from eating and drinking.  Ensure adequate pain control. 2. Renal:  Improved. Continue IVF + electrolyte supplementation as needed. Patient with persistent mild hypokalemia. Nutrition consult pending. 3. Hyponatremia: Improved. Continue IV fluids as above. 4. Gangrenous right toe: Appreciate vascular surgery input. Intervention planned for Monday.  Appreciate consult, will follow.   Lloyd Huger, MD   12/29/2015 12:34 PM

## 2015-12-29 NOTE — Consult Note (Signed)
[facility]  Date of admission:  12/28/2015  Inpatient day:  12/28/2015  Consulting physician: Dr. Gladstone Lighter  Reason for Consultation:  Head and neck cancer  Chief Complaint: Kent [name] is a 60 y.o. 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  HPI:  Patient was diagnosed with stage IVB of base of tongue cell carcinoma on 09/10/2015. He began concurrent cisplatin and radiation on 10/22/2015. He completed radiation on 12/21/2015.  He has struggled with adequate oral intake throughout his chemotherapy and radiation course.  He was admitted overnight to [facility] on 11/15/2015 for hyponatremia (sodium 122).  He received IVF overnight.  He was admitted to [facility] from 12/08/2015 - 12/09/2015 for hydration and electrolyte replacement.  Sodium was 126.  He was readmitted to [facility] from 12/09/2015 - 12/12/2015 secondary to nausea, vomiting, and minimal oral intake.  He received IVF with electrolyte replacement and anti-emetics.  A PICC line was placed for continuation of fluids in the outpatient department.  This week, he has received IV fluids and electrolyte supplementation (potassium and magnesium) daily. His oral intake has remained limited.  Chemistries have been monitored.  Sodium has ranged between 126-129.  Potassium has ranged between 2.8-3.0.  Magnesium has ranged between 1.5-1.6.  He has been frustrated coming to clinic daily.  He left early yesterday prior to completing his liter of fluids.  He has continued to drink poorly.  Yesterday he had "some Pepsi and tea".  He ate a bit of sausage and bisquit.  He took his potassium orally for the first time last night and this morning.  His sodium was dropped to 126.  After discussion  with the patient and the upcoming weekend, decision was made to admit to the hospital for continuation of fluids and electrolyte supplementation.   Past Medical History:  Diagnosis Date  . Arthritis   . Diabetes mellitus without complication ([place])   . Hyperlipidemia   . Hypertension   . Obesity   . Stomach cancer ([place])    had tumor removed- [facility]  . Tongue cancer ([place])    Stage IV squamous cell carcinoma of the base of the tongue/left tonsil    Past Surgical History:  Procedure Laterality Date  . gastric tumor removed  2010  . tongue mass biopsy      Family History  Problem Relation Age of Onset  . Heart disease Mother   . Hypertension Father   . Cancer Sister     stomach  . Heart disease Brother     MI  . Cancer Daughter   . Cancer Son     testicular  . ADD / ADHD Sister     Social History:  reports that he has never smoked. He has never used smokeless tobacco. He reports that he does not drink alcohol or use drugs.  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  The patient is alone today.  Allergies: No Known Allergies  Medications Prior to Admission  Medication Sig Dispense Refill  . fluconazole (DIFLUCAN) 100 MG tablet Take 1 tablet (100 mg total) by mouth daily. 11 tablet 0  . lisinopril (PRINIVIL,ZESTRIL) 20 MG tablet Take 1 tablet (20 mg total) by mouth daily. 30 tablet 5  . magnesium oxide (MAG-OX) 400 (  241.3 Mg) MG tablet Take 1 tablet (400 mg total) by mouth daily. 60 tablet 2  . metFORMIN (GLUCOPHAGE) 1000 MG tablet Take 1 tablet (1,000 mg total) by mouth 2 (two) times daily with a meal. 180 tablet 3  . potassium chloride (KLOR-CON) 20 MEQ packet Take 40 mEq by mouth 2 (two) times daily. 60 packet 0  . sucralfate (CARAFATE) 1 g tablet Take 1 tablet (1 g total) by mouth 3 (three) times daily. Dissolve in 2 to 3 tbs warm water, swish &swallow 90 tablet 3  . atorvastatin (LIPITOR) 20 MG tablet Take 1 tablet (20 mg total) by mouth daily.  Reported on 04/06/2015 90 tablet 1  . blood glucose meter kit and supplies KIT Dispense based on patient and insurance preference. Use up to four times daily as directed. (FOR ICD-9 250.00, 250.01). 1 each 12  . cyclobenzaprine (FLEXERIL) 10 MG tablet Take 1 tablet (10 mg total) by mouth at bedtime. 30 tablet 2  . dextromethorphan-guaiFENesin (MUCINEX DM) 30-600 MG 12hr tablet Take 1 tablet by mouth 2 (two) times daily.    . feeding supplement (BOOST / RESOURCE BREEZE) LIQD Take 1 Container by mouth 3 (three) times daily between meals. 20000 mL 2  . hydrocortisone (CORTEF) 10 MG tablet     . LORazepam (ATIVAN) 0.5 MG tablet Take 1 tablet (0.5 mg total) by mouth every 6 (six) hours as needed (Nausea or vomiting). 30 tablet 0  . magic mouthwash SOLN Take 5 mLs by mouth 4 (four) times daily as needed for mouth pain. 480 mL 1  . meloxicam (MOBIC) 15 MG tablet Take 1 tablet (15 mg total) by mouth daily. 30 tablet 0  . menthol-cetylpyridinium (CEPACOL) 3 MG lozenge Take 1 lozenge (3 mg total) by mouth as needed for sore throat. 100 tablet 12  . ondansetron (ZOFRAN) 8 MG tablet Take 1 tablet (8 mg total) by mouth 2 (two) times daily as needed. Start on the third day after chemotherapy. 30 tablet 1  . oxyCODONE-acetaminophen (PERCOCET/ROXICET) 5-325 MG tablet Take 1 tablet by mouth every 6 (six) hours as needed for severe pain. 40 tablet 0  . prochlorperazine (COMPAZINE) 10 MG tablet Take 1 tablet (10 mg total) by mouth every 6 (six) hours as needed (Nausea or vomiting). 30 tablet 1  . sodium chloride 1 g tablet Take 1 tablet (1 g total) by mouth 2 (two) times daily with a meal. 30 tablet 0  . UNIFINE PENTIPS 31G X 6 MM MISC 4 (four) times daily.      Review of Systems: GENERAL:  Feels tired.  No fevers or sweats.  Weight down 20 pounds since 12/04/2015. PERFORMANCE STATUS (ECOG):  1 HEENT:  Vision little blurry secondary to diabetes.  Thick oral secretions.  Difficulty swallowing.  No runny nose or mouth  sores. Lungs:  No shortness of breath or cough.  No hemoptysis. Cardiac:  No chest pain, palpitations, orthopnea, or PND.  GI:  Poor oral intake.  No nausea, vomiting, diarrhea, constipation, melena or hematochezia.  No prior colonoscopy. GU:  No urgency, frequency, dysuria, or hematuria.  Musculoskeletal:  No back pain.  Knee aches.  No muscle tenderness. Extremities:  No pain or swelling. Skin:  No rashes or skin changes. Neuro:  No headache, numbness or weakness, balance or coordination issues. Endocrine:  Diabetes.  No thyroid issues, hot flashes or night sweats. Psych:  No mood changes, depression or anxiety. Pain:  Throat sore. Review of systems:  All other systems reviewed and  found to be negative.  Physical Exam:  Blood pressure (!) 144/93, pulse 82, temperature 98.3 F (36.8 C), temperature source Oral, resp. rate 16, height 5' 7"  (1.702 m), weight 166 lb 11.2 oz (75.6 kg), SpO2 99 %.  GENERAL:  Chronically fatigued somewhat disheveled appearing gentleman sitting comfortably in the exam room in no acute distress. MENTAL STATUS:  Alert and oriented to person, place and time. HEAD:  Short gray hair.  Goatee.  Normocephalic, atraumatic, face symmetric, no Cushingoid features. EYES:  Blue eyes.  Pupils equal round and reactive to light and accomodation.  No conjunctivitis or scleral icterus. ENT:  Oropharynx with thick posterior pharynx secretions.  No thrush.  Tongue normal.  NECK:  Hyperpigmentation left neck with decreased adenopathy s/p radiation.   RESPIRATORY:  Clear to auscultation without rales, wheezes or rhonchi. CARDIOVASCULAR:  Regular rate and rhythm without murmur, rub or gallop. ABDOMEN:  Soft, non-tender, with active bowel sounds, and no hepatosplenomegaly.  No masses. SKIN:  No rashes, ulcers or lesions. EXTREMITIES:  Upper extremity PICC line.  No edema, no skin discoloration or tenderness.  No palpable cords. LYMPH NODES: Soft left neck adenopathy (2 cm).  No  palpable supraclavicular, axillary or inguinal adenopathy  NEUROLOGICAL: Unremarkable. PSYCH:  Appropriate.     Results for orders placed or performed during the hospital encounter of 12/28/15 (from the past 48 hour(s))  Glucose, capillary     Status: Abnormal   Collection Time: 12/28/15  5:00 PM  Result Value Ref Range   Glucose-Capillary 167 (H) 65 - 99 mg/dL   Comment 1 Notify RN    Comment 2 Document in Chart   Glucose, capillary     Status: Abnormal   Collection Time: 12/28/15  9:44 PM  Result Value Ref Range   Glucose-Capillary 105 (H) 65 - 99 mg/dL   No results found.  Assessment:  The patient is a 60 y.o.  gentleman with clinical stage IVB (T2N3M0) base of tongue squamous cell carcinoma currently day 25 s/p cycle #3 high dose cisplatin with concurrent radiation.  He is admitted with recurrent dehydration and hyponatremia secondary to poor oral intake.  He has received IVF daily in clinic.  He left clinic early yesterday after only receiving 1/3 of his fluids.  When he does not receive fluids, he deteriorates secondary to his minimal oral intake.  Plan:   1.  Oncology:  Day 25 s/p last cycle of chemotherapy.  Radiation is complete.  Patient has mucositis and significant thick secretions in posterior pharynx from radiation which prevent him from eating and drinking.  Ensure adequate pain control.  2. Renal:  Dehydration + electrolyte issues (hyponatremia, hypokalemia, and hypomagnesemia).  Plan continuous IVF + electrolyte supplementation as needed.  Check daily electrolytes.  Nutrition consult.   Addendum:  Nursing assessment noted gangrenous right second toe.  He has never complained of any concerns.  Patient has been examined in clinic with his shoes on.  Agree with vascular consultation.     Thank you for allowing me to participate in [place] 's care.  I will follow him closely with you while hospitalized and after discharge in the outpatient department.  Dr Grayland Ormond is  available this weekend.   Lequita Asal, MD  12/28/2015

## 2015-12-29 NOTE — Progress Notes (Signed)
[place] [facility] [place] NAME: Kent [name]    MR#:  [id number]  DATE OF BIRTH:  07-Oct-1955  SUBJECTIVE:  CHIEF COMPLAINT:  No chief complaint on file.  Point dysphagia is same.  REVIEW OF SYSTEMS:    Review of Systems  Constitutional: Positive for weight loss. Negative for chills and fever.  HENT: Negative for sore throat.   Eyes: Negative for blurred vision, double vision and pain.  Respiratory: Negative for cough, hemoptysis, shortness of breath and wheezing.   Cardiovascular: Negative for chest pain, palpitations, orthopnea and leg swelling.  Gastrointestinal: Negative for abdominal pain, constipation, diarrhea, heartburn, nausea and vomiting.  Genitourinary: Negative for dysuria and hematuria.  Musculoskeletal: Negative for back pain and joint pain.  Skin: Negative for rash.  Neurological: Positive for weakness. Negative for sensory change, speech change, focal weakness and headaches.  Endo/Heme/Allergies: Does not bruise/bleed easily.  Psychiatric/Behavioral: Negative for depression. The patient is not nervous/anxious.     DRUG ALLERGIES:  No Known Allergies  VITALS:  Blood pressure (!) 134/93, pulse 88, temperature 98.3 F (36.8 C), temperature source Oral, resp. rate 16, height '5\' 7"'$  (1.702 m), weight 75.6 kg (166 lb 11.2 oz), SpO2 99 %.  PHYSICAL EXAMINATION:   Physical Exam  GENERAL:  61 y.o.-year-old patient lying in the bed with no acute distress.  EYES: Pupils equal, round, reactive to light and accommodation. No scleral icterus. Extraocular muscles intact.  HEENT: Head atraumatic, normocephalic. Oropharynx and nasopharynx clear.  NECK:  Supple, no jugular venous distention. No thyroid enlargement, no tenderness.  LUNGS: Normal breath sounds bilaterally, no wheezing, rales, rhonchi. No use of accessory muscles of respiration.  CARDIOVASCULAR: S1, S2 normal. No murmurs, rubs, or gallops.  ABDOMEN: Soft, nontender,  nondistended. Bowel sounds present. No organomegaly or mass.  EXTREMITIES: No cyanosis, clubbing or edema b/l.    NEUROLOGIC: Cranial nerves II through XII are intact. No focal Motor or sensory deficits b/l.   PSYCHIATRIC: The patient is alert and oriented x 3.  SKIN: No obvious rash, lesion, or ulcer.   Gangrenous right second toe LABORATORY PANEL:   CBC  Recent Labs Lab 12/29/15 0604  WBC 9.0  HGB 9.6*  HCT 26.6*  PLT 214   ------------------------------------------------------------------------------------------------------------------ Chemistries   Recent Labs Lab 12/24/15 0920  12/28/15 0810 12/29/15 0604  NA 127*  < > 124* 129*  K 2.8*  < > 3.2* 3.2*  CL 90*  < > 88* 95*  CO2 26  < > 27 27  GLUCOSE 139*  < > 181* 104*  BUN 22*  < > 12 6  CREATININE 0.70  < > 0.68 0.66  CALCIUM 8.6*  < > 8.2* 7.9*  MG 1.5*  < > 1.5*  --   AST 17  --   --   --   ALT 19  --   --   --   ALKPHOS 82  --   --   --   BILITOT 0.9  --   --   --   < > = values in this interval not displayed. ------------------------------------------------------------------------------------------------------------------  Cardiac Enzymes No results for input(s): TROPONINI in the last 168 hours. ------------------------------------------------------------------------------------------------------------------  RADIOLOGY:  No results found.   ASSESSMENT AND PLAN:   Kent [name]  is a 60 y.o. male with a known history of Diabetes, hypertension, hyperlipidemia, arthritis, history of stomach tumor that was removed, active stage IV squamous cell carcinoma of the tongue status post chemoradiation presents to hospital  from [place] secondary to dehydration and abnormal labs.  # hyponatremia with hypokalemia and hypomagnesemia-has a PICC line. Continue IV and oral supplements with fluids. Repeat labs in AM  # Gangrenous right second toe-well demarcated at this time. Angiogram monday  # diabetes  mellitus-with hypoglycemia  Lantus was discontinued at discharge last admission. Continue metformin and also sliding scale insulin  # squamous cell carcinoma of the tongue/tonsil-finished radiation. Oncology on board  # hypertension-lisinopril  # GERD-add Protonix. Already on sucralfate  # DVT prophylaxis-on Lovenox  All the records are reviewed and case discussed with Care Management/Social Workerr. Management plans discussed with the patient, family and they are in agreement.  CODE STATUS: FULL CODE  DVT Prophylaxis: SCDs  TOTAL TIME TAKING CARE OF THIS PATIENT: 35 minutes.   POSSIBLE D/C IN 1-2 DAYS, DEPENDING ON CLINICAL CONDITION.  Hillary Bow R M.D on 12/29/2015 at 11:19 AM  Between 7am to 6pm - Pager - [phone]  After 6pm go to [email or web address] - password [email or web address] [facility]  Office  [phone]  CC: Primary care physician; Kathrine Haddock, NP  Note: This dictation was prepared with Dragon dictation along with smaller phrase technology. Any transcriptional errors that result from this process are unintentional.

## 2015-12-30 LAB — BASIC METABOLIC PANEL
ANION GAP: 7 (ref 5–15)
BUN: 6 mg/dL (ref 6–20)
CALCIUM: 8.1 mg/dL — AB (ref 8.9–10.3)
CO2: 29 mmol/L (ref 22–32)
Chloride: 96 mmol/L — ABNORMAL LOW (ref 101–111)
Creatinine, Ser: 0.62 mg/dL (ref 0.61–1.24)
Glucose, Bld: 99 mg/dL (ref 65–99)
POTASSIUM: 3.6 mmol/L (ref 3.5–5.1)
Sodium: 132 mmol/L — ABNORMAL LOW (ref 135–145)

## 2015-12-30 LAB — GLUCOSE, CAPILLARY
GLUCOSE-CAPILLARY: 102 mg/dL — AB (ref 65–99)
Glucose-Capillary: 98 mg/dL (ref 65–99)
Glucose-Capillary: 94 mg/dL (ref 65–99)
Glucose-Capillary: 96 mg/dL (ref 65–99)

## 2015-12-30 MED ORDER — DEXTROSE 5 % IV SOLN: 1.5000 g | INTRAVENOUS | Status: DC

## 2015-12-30 MED ORDER — SODIUM CHLORIDE 0.9 % IV SOLN
INTRAVENOUS | Status: DC
  Administered 2015-12-31: 13:00:00 via INTRAVENOUS

## 2015-12-30 NOTE — Progress Notes (Signed)
[place] at [place] NAME: Kent [name]    MR#:  [id number]  DATE OF BIRTH:  [birth year]  SUBJECTIVE:  CHIEF COMPLAINT:  No chief complaint on file.  Chronic dysphagia is same. No pain in his gangrenous toe. Feels better.  REVIEW OF SYSTEMS:    Review of Systems  Constitutional: Positive for weight loss. Negative for chills and fever.  HENT: Negative for sore throat.   Eyes: Negative for blurred vision, double vision and pain.  Respiratory: Negative for cough, hemoptysis, shortness of breath and wheezing.   Cardiovascular: Negative for chest pain, palpitations, orthopnea and leg swelling.  Gastrointestinal: Negative for abdominal pain, constipation, diarrhea, heartburn, nausea and vomiting.  Genitourinary: Negative for dysuria and hematuria.  Musculoskeletal: Negative for back pain and joint pain.  Skin: Negative for rash.  Neurological: Positive for weakness. Negative for sensory change, speech change, focal weakness and headaches.  Endo/Heme/Allergies: Does not bruise/bleed easily.  Psychiatric/Behavioral: Negative for depression. The patient is not nervous/anxious.     DRUG ALLERGIES:  No Known Allergies  VITALS:  Blood pressure (!) 149/97, pulse 78, temperature 98.6 F (37 C), temperature source Oral, resp. rate 20, height '5\' 7"'$  (1.702 m), weight 75.6 kg (166 lb 11.2 oz), SpO2 97 %.  PHYSICAL EXAMINATION:   Physical Exam  GENERAL:  60 y.o.-year-old patient lying in the bed with no acute distress.  EYES: Pupils equal, round, reactive to light and accommodation. No scleral icterus. Extraocular muscles intact.  HEENT: Head atraumatic, normocephalic. Oropharynx and nasopharynx clear.  NECK:  Supple, no jugular venous distention. No thyroid enlargement, no tenderness.  LUNGS: Normal breath sounds bilaterally, no wheezing, rales, rhonchi. No use of accessory muscles of respiration.  CARDIOVASCULAR: S1, S2 normal. No murmurs, rubs, or  gallops.  ABDOMEN: Soft, nontender, nondistended. Bowel sounds present. No organomegaly or mass.  EXTREMITIES: No cyanosis, clubbing or edema b/l.    NEUROLOGIC: Cranial nerves II through XII are intact. No focal Motor or sensory deficits b/l.   PSYCHIATRIC: The patient is alert and oriented x 3.  SKIN: No obvious rash, lesion, or ulcer.   Gangrenous right second toe LABORATORY PANEL:   CBC  Recent Labs Lab 12/29/15 0604  WBC 9.0  HGB 9.6*  HCT 26.6*  PLT 214   ------------------------------------------------------------------------------------------------------------------ Chemistries   Recent Labs Lab 12/24/15 0920  12/28/15 0810  12/30/15 0444  NA 127*  < > 124*  < > 132*  K 2.8*  < > 3.2*  < > 3.6  CL 90*  < > 88*  < > 96*  CO2 26  < > 27  < > 29  GLUCOSE 139*  < > 181*  < > 99  BUN 22*  < > 12  < > 6  CREATININE 0.70  < > 0.68  < > 0.62  CALCIUM 8.6*  < > 8.2*  < > 8.1*  MG 1.5*  < > 1.5*  --   --   AST 17  --   --   --   --   ALT 19  --   --   --   --   ALKPHOS 82  --   --   --   --   BILITOT 0.9  --   --   --   --   < > = values in this interval not displayed. ------------------------------------------------------------------------------------------------------------------  Cardiac Enzymes No results for input(s): TROPONINI in the last 168 hours. ------------------------------------------------------------------------------------------------------------------  RADIOLOGY:  No  results found.   ASSESSMENT AND PLAN:   [name]  is a 60 y.o. male with a known history of Diabetes, hypertension, hyperlipidemia, arthritis, history of stomach tumor that was removed, active stage IV squamous cell carcinoma of the tongue status post chemoradiation presents to hospital from [place] secondary to dehydration and abnormal labs.  # hyponatremia with hypokalemia and hypomagnesemia-has a PICC line. Continue IV and oral supplements with  fluids. Improving. Repeat labs in AM  # Gangrenous right second toe-well demarcated at this time. Angiogram With amputation monday  # diabetes mellitus-with hypoglycemia  Lantus was discontinued at discharge last admission. Continue metformin and also sliding scale insulin  # squamous cell carcinoma of the tongue/tonsil-finished radiation. Oncology on board  # hypertension-lisinopril  # GERD-add Protonix. Already on sucralfate  # DVT prophylaxis-on Lovenox  All the records are reviewed and case discussed with Care Management/Social Workerr. Management plans discussed with the patient, family and they are in agreement.  CODE STATUS: FULL CODE  DVT Prophylaxis: SCDs  TOTAL TIME TAKING CARE OF THIS PATIENT: 35 minutes.   Discharge tomorrow after his angiogram if sodium continues to improve  Hillary Bow R M.D on 12/30/2015 at 10:41 AM  Between 7am to 6pm - Pager - [phone]  After 6pm go to [email or web address] - password [email or web address] [facility]  Office  [phone]  CC: Primary care physician; Kathrine Haddock, NP  Note: This dictation was prepared with Dragon dictation along with smaller phrase technology. Any transcriptional errors that result from this process are unintentional.

## 2015-12-31 ENCOUNTER — Inpatient Hospital Stay: Payer: Managed Care, Other (non HMO) | Admitting: Hematology and Oncology

## 2015-12-31 ENCOUNTER — Inpatient Hospital Stay: Payer: Managed Care, Other (non HMO)

## 2015-12-31 ENCOUNTER — Encounter
Admission: AD | Disposition: A | Discharge status: Home / Self Care | Payer: Self-pay | Source: Ambulatory Visit | Attending: Internal Medicine

## 2015-12-31 DIAGNOSIS — I70261 Atherosclerosis of native arteries of extremities with gangrene, right leg: Secondary | ICD-10-CM | POA: Diagnosis not present

## 2015-12-31 HISTORY — PX: PERIPHERAL VASCULAR CATHETERIZATION: SHX172C

## 2015-12-31 LAB — GLUCOSE, CAPILLARY
Glucose-Capillary: 100 mg/dL — ABNORMAL HIGH (ref 65–99)
Glucose-Capillary: 103 mg/dL — ABNORMAL HIGH (ref 65–99)
Glucose-Capillary: 94 mg/dL (ref 65–99)

## 2015-12-31 SURGERY — LOWER EXTREMITY ANGIOGRAPHY
Anesthesia: Moderate Sedation | Laterality: Right

## 2015-12-31 MED ORDER — HEPARIN (PORCINE) IN NACL 2-0.9 UNIT/ML-% IJ SOLN
INTRAMUSCULAR | Status: AC
  Filled 2015-12-31: qty 1000

## 2015-12-31 MED ORDER — SODIUM CHLORIDE 0.9% FLUSH
10.0000 mL | INTRAVENOUS | Status: AC | PRN
  Administered 2015-12-31: 17:00:00 10 mL

## 2015-12-31 MED ORDER — FLUCONAZOLE 100 MG PO TABS
100.0000 mg | ORAL_TABLET | Freq: Once | ORAL | Status: AC
  Administered 2015-12-31: 17:00:00 100 mg via ORAL
  Filled 2015-12-31: qty 1

## 2015-12-31 MED ORDER — HEPARIN SODIUM (PORCINE) 1000 UNIT/ML IJ SOLN
INTRAMUSCULAR | Status: AC
  Filled 2015-12-31: qty 1

## 2015-12-31 MED ORDER — LIDOCAINE-EPINEPHRINE (PF) 1 %-1:200000 IJ SOLN
INTRAMUSCULAR | Status: AC
  Filled 2015-12-31: qty 30

## 2015-12-31 MED ORDER — MIDAZOLAM HCL 5 MG/5ML IJ SOLN
INTRAMUSCULAR | Status: AC
  Filled 2015-12-31: qty 5

## 2015-12-31 MED ORDER — LIDOCAINE VISCOUS 2 % MT SOLN: 15.0000 mL | Freq: Four times a day (QID) | OROMUCOSAL | 0 refills | Status: DC | PRN

## 2015-12-31 MED ORDER — LIDOCAINE VISCOUS 2 % MT SOLN
OROMUCOSAL | Status: AC
  Filled 2015-12-31: qty 15

## 2015-12-31 MED ORDER — MIDAZOLAM HCL 2 MG/2ML IJ SOLN
INTRAMUSCULAR | Status: DC | PRN
  Administered 2015-12-31: 2 mg via INTRAVENOUS

## 2015-12-31 MED ORDER — OXYCODONE-ACETAMINOPHEN 5-325 MG PO TABS: 1.0000 | ORAL_TABLET | Freq: Four times a day (QID) | ORAL | 0 refills | Status: DC | PRN

## 2015-12-31 MED ORDER — ASPIRIN 81 MG PO TABS: 81.0000 mg | ORAL_TABLET | Freq: Every day | ORAL | 0 refills | Status: DC

## 2015-12-31 MED ORDER — HEPARIN SOD (PORK) LOCK FLUSH 100 UNIT/ML IV SOLN
250.0000 [IU] | INTRAVENOUS | Status: AC | PRN
  Administered 2015-12-31: 17:00:00 250 [IU]
  Filled 2015-12-31: qty 5

## 2015-12-31 MED ORDER — FENTANYL CITRATE (PF) 100 MCG/2ML IJ SOLN
INTRAMUSCULAR | Status: AC
  Filled 2015-12-31: qty 2

## 2015-12-31 MED ORDER — FENTANYL CITRATE (PF) 100 MCG/2ML IJ SOLN
INTRAMUSCULAR | Status: DC | PRN
  Administered 2015-12-31: 50 ug via INTRAVENOUS

## 2015-12-31 SURGICAL SUPPLY — 8 items
CATH PIG 70CM (CATHETERS) ×3 IMPLANT
DEVICE STARCLOSE SE CLOSURE (Vascular Products) ×3 IMPLANT
GLIDEWIRE ADV .035X260CM (WIRE) ×3 IMPLANT
PACK ANGIOGRAPHY (CUSTOM PROCEDURE TRAY) ×3 IMPLANT
SHEATH BRITE TIP 5FRX11 (SHEATH) ×3 IMPLANT
SYR MEDRAD MARK V 150ML (SYRINGE) ×3 IMPLANT
TUBING CONTRAST HIGH PRESS 72 (TUBING) ×3 IMPLANT
WIRE J 3MM .035X145CM (WIRE) ×3 IMPLANT

## 2015-12-31 NOTE — H&P (Signed)
[facility] History & Physical Update  The patient was interviewed and re-examined.  The patient's previous History and Physical has been reviewed and is unchanged.  There is no change in the plan of care. We plan to proceed with the scheduled procedure.  Leotis Pain, MD  12/31/2015, 1:11 PM

## 2015-12-31 NOTE — Care Management (Signed)
Admitted to [facility] with the diagnosis of hyponatremia. Lives with wife, Olin Hauser [phone]). Seen Kathrine Haddock NP in the last year. Takes care of all basic activities of daily living himself, drives. No home Health in the past. No skilled facility. No home oxygen. Prescriptions are filled at [facility] in [place]. No falls. Appetite  is S0-So. Lost weight in the last few months due to tongue cancer.  Scheduled for toe amputation this afternoon. Discussed that he may need home health at discharge. Discussed that Mr. Haberman has Christella Scheuermann as his insurance and [facility] would need to arrange his home health.  Shelbie Ammons RN MSN CCM Care Management [phone]

## 2015-12-31 NOTE — Op Note (Signed)
[facility] Percutaneous Study/Intervention Procedural Note   Date of Surgery: 12/28/2015 - 12/31/2015  Surgeon(s):Slayton Lubitz   Assistants:none  Pre-operative Diagnosis: gangrene right second toe  Post-operative diagnosis: Same  Procedure(s) Performed: 1. Ultrasound guidance for vascular access left femoral artery 2. Catheter placement into right SFA from left femoral approach 3. Aortogram and selective right lower extremity angiogram 4. StarClose closure device left femoral artery  EBL: 10 cc  Contrast: 45 cc  Fluoro Time: 1.6 minutes  Moderate Conscious Sedation Time: approximately 20 minutes using 2 mg of Versed and 50 mcg of Fentanyl  Indications: Patient is a 60 y.o.male with a gangrenous right second toe. Duplex is not available at our institution. The patient is brought in for angiography for further evaluation and potential treatment. Risks and benefits are discussed and informed consent is obtained  Procedure: The patient was identified and appropriate procedural time out was performed. The patient was then placed supine on the table and prepped and draped in the usual sterile fashion.Moderate conscious sedation was administered during a face to face encounter with the patient throughout the procedure with my supervision of the RN administering medicines and monitoring the patient's vital signs, pulse oximetry, telemetry and mental status throughout from the start of the procedure until the patient was taken to the recovery room. Ultrasound was used to evaluate the left common femoral artery. It was patent . A digital ultrasound image was acquired. A Seldinger needle was used to access the left common femoral artery under direct ultrasound guidance and a permanent image was performed. A 0.035 J wire was advanced without resistance and a 5Fr sheath was placed. Pigtail catheter was  placed into the aorta and an AP aortogram was performed. This demonstrated normal renal arteries (two on the left, one on the right) and normal aorta and iliac segments without significant stenosis. His circulation time was extremely slow which would be consistent with a very poor ejection fraction. I then crossed the aortic bifurcation and advanced to the right femoral head. To opacify the distal vessels I had to advance the catheter into the mid to distal superficial femoral artery due to the slow circulation time. Selective right lower extremity angiogram was then performed. This demonstrated normal common femoral artery, profunda femoris artery, and superficial femoral artery. The popliteal artery did not have any significant stenosis but was somewhat generous. He does not have any CT or duplex imaging to see if this is truly aneurysmal, and the flow lumen was of slightly above average size. He then had a large posterior tibial artery that was continuous to the foot and a small peroneal artery as a second runoff vessel. The anterior tibial artery appeared to occlude was not continuous to the foot. There was no need for treatment of stenotic disease and I did not see any large irregular plaques that would explain embolus. Although I think popliteal aneurysm is certainly possible, without definitive imaging to demonstrate this was the case, a covered stent should not be performed at this time. I elected to terminate the procedure. The sheath was removed and StarClose closure device was deployed in the left femoral artery with excellent hemostatic result. The patient was taken to the recovery room in stable condition having tolerated the procedure well.  Findings:  Aortogram: two left renal arteries, one right renal artery both patent.  The aorta and iliac segments were patent without significant stenosis. Right Lower Extremity: normal common femoral artery, profunda femoris artery,  and superficial femoral artery.  The popliteal artery did not have any significant stenosis but was somewhat generous. He does not have any CT or duplex imaging to see if this is truly aneurysmal, and the flow lumen was of slightly above average size. He then had a large posterior tibial artery that was continuous to the foot and a small peroneal artery as a second runoff vessel. The anterior tibial artery appeared to occlude was not continuous to the foot.   Disposition: Patient was taken to the recovery room in stable condition having tolerated the procedure well.  Complications: None  Leotis Pain 12/31/2015 1:55 PM   This note was created with Dragon Medical transcription system. Any errors in dictation are purely unintentional.

## 2015-12-31 NOTE — Discharge Instructions (Signed)
Resume diet as before.

## 2015-12-31 NOTE — Progress Notes (Addendum)
Pt and wife given prescriptions x 3. Information related to activity, diet, follow up care and medications, voiced understanding/  D/c home via wheelchair escorted y family and staff.  Pt's wife instruction r/t scar closure in left groin including when to Call MD, when to shower and how to clean, and activity, voiced understanding.

## 2016-01-01 ENCOUNTER — Inpatient Hospital Stay (HOSPITAL_BASED_OUTPATIENT_CLINIC_OR_DEPARTMENT_OTHER): Payer: Managed Care, Other (non HMO) | Admitting: Hematology and Oncology

## 2016-01-01 ENCOUNTER — Inpatient Hospital Stay: Payer: Managed Care, Other (non HMO)

## 2016-01-01 ENCOUNTER — Ambulatory Visit: Payer: Self-pay | Admitting: Hematology and Oncology

## 2016-01-01 ENCOUNTER — Encounter: Payer: Self-pay | Admitting: Vascular Surgery

## 2016-01-01 ENCOUNTER — Other Ambulatory Visit: Payer: Self-pay | Admitting: *Deleted

## 2016-01-01 VITALS — BP 99/79 | HR 128 | Temp 99.1°F | Wt 163.4 lb

## 2016-01-01 VITALS — BP 118/83 | HR 102 | Temp 97.4°F | Resp 18

## 2016-01-01 DIAGNOSIS — E871 Hypo-osmolality and hyponatremia: Secondary | ICD-10-CM

## 2016-01-01 DIAGNOSIS — Z7984 Long term (current) use of oral hypoglycemic drugs: Secondary | ICD-10-CM | POA: Diagnosis not present

## 2016-01-01 DIAGNOSIS — Z8043 Family history of malignant neoplasm of testis: Secondary | ICD-10-CM | POA: Diagnosis not present

## 2016-01-01 DIAGNOSIS — R112 Nausea with vomiting, unspecified: Secondary | ICD-10-CM

## 2016-01-01 DIAGNOSIS — Z794 Long term (current) use of insulin: Secondary | ICD-10-CM

## 2016-01-01 DIAGNOSIS — Z85028 Personal history of other malignant neoplasm of stomach: Secondary | ICD-10-CM

## 2016-01-01 DIAGNOSIS — K1231 Oral mucositis (ulcerative) due to antineoplastic therapy: Secondary | ICD-10-CM

## 2016-01-01 DIAGNOSIS — Z800 Family history of malignant neoplasm of digestive organs: Secondary | ICD-10-CM | POA: Diagnosis not present

## 2016-01-01 DIAGNOSIS — I10 Essential (primary) hypertension: Secondary | ICD-10-CM | POA: Diagnosis not present

## 2016-01-01 DIAGNOSIS — Z79899 Other long term (current) drug therapy: Secondary | ICD-10-CM | POA: Diagnosis not present

## 2016-01-01 DIAGNOSIS — I96 Gangrene, not elsewhere classified: Secondary | ICD-10-CM

## 2016-01-01 DIAGNOSIS — M129 Arthropathy, unspecified: Secondary | ICD-10-CM

## 2016-01-01 DIAGNOSIS — Z809 Family history of malignant neoplasm, unspecified: Secondary | ICD-10-CM | POA: Diagnosis not present

## 2016-01-01 DIAGNOSIS — E785 Hyperlipidemia, unspecified: Secondary | ICD-10-CM | POA: Diagnosis not present

## 2016-01-01 DIAGNOSIS — D696 Thrombocytopenia, unspecified: Secondary | ICD-10-CM

## 2016-01-01 DIAGNOSIS — R630 Anorexia: Secondary | ICD-10-CM | POA: Diagnosis not present

## 2016-01-01 DIAGNOSIS — E119 Type 2 diabetes mellitus without complications: Secondary | ICD-10-CM

## 2016-01-01 DIAGNOSIS — E669 Obesity, unspecified: Secondary | ICD-10-CM | POA: Diagnosis not present

## 2016-01-01 DIAGNOSIS — R531 Weakness: Secondary | ICD-10-CM | POA: Diagnosis not present

## 2016-01-01 DIAGNOSIS — C01 Malignant neoplasm of base of tongue: Secondary | ICD-10-CM | POA: Diagnosis not present

## 2016-01-01 DIAGNOSIS — Y842 Radiological procedure and radiotherapy as the cause of abnormal reaction of the patient, or of later complication, without mention of misadventure at the time of the procedure: Secondary | ICD-10-CM | POA: Diagnosis not present

## 2016-01-01 DIAGNOSIS — J029 Acute pharyngitis, unspecified: Secondary | ICD-10-CM | POA: Diagnosis not present

## 2016-01-01 DIAGNOSIS — E876 Hypokalemia: Secondary | ICD-10-CM

## 2016-01-01 DIAGNOSIS — B370 Candidal stomatitis: Secondary | ICD-10-CM

## 2016-01-01 DIAGNOSIS — Z5111 Encounter for antineoplastic chemotherapy: Secondary | ICD-10-CM | POA: Diagnosis not present

## 2016-01-01 LAB — BASIC METABOLIC PANEL
Anion gap: 8 (ref 5–15)
BUN: 12 mg/dL (ref 6–20)
CO2: 26 mmol/L (ref 22–32)
Calcium: 8.2 mg/dL — ABNORMAL LOW (ref 8.9–10.3)
Chloride: 91 mmol/L — ABNORMAL LOW (ref 101–111)
Creatinine, Ser: 0.91 mg/dL (ref 0.61–1.24)
GFR calc Af Amer: >60 mL/min (ref >60)
GFR calc non Af Amer: >60 mL/min (ref >60)
Glucose, Bld: 189 mg/dL — ABNORMAL HIGH (ref 65–99)
Potassium: 3.5 mmol/L (ref 3.5–5.1)
Sodium: 125 mmol/L — ABNORMAL LOW (ref 135–145)

## 2016-01-01 LAB — MAGNESIUM: Magnesium: 1.2 mg/dL — ABNORMAL LOW (ref 1.7–2.4)

## 2016-01-01 MED ORDER — LIDOCAINE VISCOUS 2 % MT SOLN: 15.0000 mL | Freq: Four times a day (QID) | OROMUCOSAL | 0 refills | Status: DC | PRN

## 2016-01-01 MED ORDER — SODIUM CHLORIDE 0.9 % IV SOLN
INTRAVENOUS | Status: AC
  Administered 2016-01-01: 12:00:00 via INTRAVENOUS
  Filled 2016-01-01: qty 1000

## 2016-01-01 MED ORDER — HEPARIN SOD (PORK) LOCK FLUSH 100 UNIT/ML IV SOLN
250.0000 [IU] | Freq: Once | INTRAVENOUS | Status: AC | PRN
Start: 2016-01-01 — End: 2016-01-01
  Administered 2016-01-01: 250 [IU]

## 2016-01-01 MED ORDER — OXYCODONE-ACETAMINOPHEN 5-325 MG PO TABS
1.0000 | ORAL_TABLET | Freq: Once | ORAL | Status: AC
  Administered 2016-01-01: 1 via ORAL
  Filled 2016-01-01: qty 1

## 2016-01-01 NOTE — Progress Notes (Signed)
Judeen Hammans and I spoke with patient extensively about the importance of nutrition and trying to eat creamed soups and/or broths.  States he tried Boost this morning but threw it up.  Patients BP 99/79 HR 128.  Standing 105/73 HR 137.  Patient states he is a little bit dizzy when he first stands up.  Patient requesting refill for Percocet.

## 2016-01-01 NOTE — Progress Notes (Signed)
[facility] day:  01/01/16  Chief Complaint: Kent [name] is a 60 y.o. 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  HPI:  The patient was last seen in the medical oncology clinic on 12/28/2015.  At that time, he continued to drink minimal fluids and eat very little.  He had mucositis from radiation.  Because of worsening hyponatremia and the upcoming weekend, he was admitted to the hospital for continued daily hydration.  He was admitted to [facility] from 12/28/2015 - 12/31/2015.  He received IVF while hospitalized.  He was discovered to have dry gangrene of the right second toe.  He was seen by vascular medicine.  He underwent aortogram and right lower extremity angiogram on 12/31/2015.  Angiogram revealed good perfusion. Distal digit was felt likely to auto amputate.  He was to follow-up with Dr. Lucky Cowboy in the outpatient department.  Symptomatically, he notes some slight pain with swallowing. He drank 2 bottles of boost and had emesis.  He last had any Gatorade a week ago. His oral intake remains poor.   Past Medical History:  Diagnosis Date  . Arthritis   . Diabetes mellitus without complication ([place])   . Hyperlipidemia   . Hypertension   . Obesity   . 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 Tongue cancer ([place])    Stage IV squamous cell carcinoma of the base of the tongue/left tonsil    Past Surgical History:  Procedure Laterality Date  . gastric tumor removed  2010  . PERIPHERAL VASCULAR CATHETERIZATION Right 12/31/2015   Procedure: Lower Extremity Angiography;  Surgeon: Algernon Huxley, MD;  Location: [facility];  Service: Cardiovascular;  Laterality: Right;  . tongue mass biopsy      Family History  Problem Relation Age of Onset  . Heart disease Mother   . Hypertension Father   . Cancer Sister    stomach  . Heart disease Brother     MI  . Cancer Daughter   . Cancer Son     testicular  . ADD / ADHD Sister     Social History:  reports that he has never smoked. He has never used smokeless tobacco. He reports that he does not drink alcohol or use drugs.  He smokes a pipe and cigar rarely (once every 3 months).  He has a son and daughter.  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  He has planned (and paid for) a trip to the beach from 10/15/2015 - 10/20/2015.  He  has no short term disability.  He has 10 weeks of vacation and sick leave.   Allergies: No Known Allergies  Current Medications: Current Outpatient Prescriptions  Medication Sig Dispense Refill  . aspirin 81 MG tablet Take 1 tablet (81 mg total) by mouth daily. 30 tablet 0  . dexamethasone (DECADRON) 2 MG tablet Take 2 mg by mouth 2 (two) times daily.    Marland Kitchen dextromethorphan-guaiFENesin (MUCINEX DM) 30-600 MG 12hr tablet Take 1 tablet by mouth 2 (two) times daily.    . feeding supplement (BOOST / RESOURCE BREEZE) LIQD Take 1 Container by mouth 3 (three) times daily between meals. 20000 mL 2  . lidocaine (XYLOCAINE) 2 % solution Use as directed 15 mLs in the mouth or throat every 6 (six)  hours as needed for mouth pain. 1000 mL 0  . lisinopril (PRINIVIL,ZESTRIL) 20 MG tablet Take 1 tablet (20 mg total) by mouth daily. 30 tablet 5  . LORazepam (ATIVAN) 0.5 MG tablet Take 1 tablet (0.5 mg total) by mouth every 6 (six) hours as needed (Nausea or vomiting). 30 tablet 0  . magic mouthwash SOLN Take 5 mLs by mouth 4 (four) times daily as needed for mouth pain. 480 mL 1  . magnesium oxide (MAG-OX) 400 (241.3 Mg) MG tablet Take 1 tablet (400 mg total) by mouth daily. 60 tablet 2  . meloxicam (MOBIC) 15 MG tablet Take 1 tablet (15 mg total) by mouth daily. 30 tablet 0  . menthol-cetylpyridinium (CEPACOL) 3 MG lozenge Take 1 lozenge (3 mg total) by mouth as needed for sore throat. 100 tablet 12  . metFORMIN  (GLUCOPHAGE) 1000 MG tablet Take 1 tablet (1,000 mg total) by mouth 2 (two) times daily with a meal. 180 tablet 3  . ondansetron (ZOFRAN) 8 MG tablet Take 1 tablet (8 mg total) by mouth 2 (two) times daily as needed. Start on the third day after chemotherapy. 30 tablet 1  . oxyCODONE-acetaminophen (PERCOCET/ROXICET) 5-325 MG tablet Take 1 tablet by mouth every 6 (six) hours as needed for severe pain. 15 tablet 0  . potassium chloride (KLOR-CON) 20 MEQ packet Take 40 mEq by mouth 2 (two) times daily. 60 packet 0  . prochlorperazine (COMPAZINE) 10 MG tablet Take 1 tablet (10 mg total) by mouth every 6 (six) hours as needed (Nausea or vomiting). 30 tablet 1  . sucralfate (CARAFATE) 1 g tablet Take 1 tablet (1 g total) by mouth 3 (three) times daily. Dissolve in 2 to 3 tbs warm water, swish &swallow 90 tablet 3   No current facility-administered medications for this visit.     Review of Systems:  GENERAL:  Fatigue.  No fevers or sweats.  Weight down 20 pounds since 12/04/2015. PERFORMANCE STATUS (ECOG):  1 HEENT:  Vision little blurry secondary to diabetes.  Thick oral secretions.  Slight pain with swallowing.  No runny nose or mouth sores. Lungs:  No shortness of breath or cough.  No hemoptysis. Cardiac:  No chest pain, palpitations, orthopnea, or PND.  GI:  Poor oral intake.  No nausea, vomiting, diarrhea, constipation, melena or hematochezia.  No prior colonoscopy. GU:  No urgency, frequency, dysuria, or hematuria.  Musculoskeletal:  No back pain.  Knee aches.  No muscle tenderness. Extremities:  Right second toe gangrene.  No pain or swelling. Skin:  No rashes or skin changes. Neuro:  No headache, numbness or weakness, balance or coordination issues. Endocrine:  Diabetes.  No thyroid issues, hot flashes or night sweats. Psych:  No mood changes, depression or anxiety. Pain:  Throat pain. Review of systems:  All other systems reviewed and found to be negative.  Physical Exam: Blood pressure  99/79, pulse (!) 128, temperature 99.1 F (37.3 C), temperature source Tympanic, weight 163 lb 5.8 oz (74.1 kg). GENERAL:  Chronically fatigued appearing gentleman sitting comfortably in the exam room in no acute distress. MENTAL STATUS:  Alert and oriented to person, place and time. HEAD:  Short gray hair.  Goatee.  Normocephalic, atraumatic, face symmetric, no Cushingoid features. EYES:  Blue eyes.  Pupils equal round and reactive to light and accomodation.  No conjunctivitis or scleral icterus. ENT:  Oropharynx with thick posterior pharynx secretions.  No thrush.  Tongue normal.  NECK:  Hyperpigmentation left neck with decreased adenopathy s/p radiation.  RESPIRATORY:  Clear to auscultation without rales, wheezes or rhonchi. CARDIOVASCULAR:  Regular rate and rhythm without murmur, rub or gallop. ABDOMEN:  Soft, non-tender, with active bowel sounds, and no hepatosplenomegaly.  No masses. SKIN:  No rashes, ulcers or lesions. EXTREMITIES:  Upper extremity PICC line.  Tip of right second toe black (dry).  No edema, no skin discoloration or tenderness.  No palpable cords. LYMPH NODES: Soft left neck adenopathy (2 cm).  No palpable supraclavicular, axillary or inguinal adenopathy  NEUROLOGICAL: Unremarkable. PSYCH:  Appropriate.     Appointment on 01/01/2016  Component Date Value Ref Range Status  . Sodium 01/01/2016 125* 135 - 145 mmol/L Final  . Potassium 01/01/2016 3.5  3.5 - 5.1 mmol/L Final  . Chloride 01/01/2016 91* 101 - 111 mmol/L Final  . CO2 01/01/2016 26  22 - 32 mmol/L Final  . Glucose, Bld 01/01/2016 189* 65 - 99 mg/dL Final  . BUN 01/01/2016 12  6 - 20 mg/dL Final  . Creatinine, Ser 01/01/2016 0.91  0.61 - 1.24 mg/dL Final  . Calcium 01/01/2016 8.2* 8.9 - 10.3 mg/dL Final  . GFR calc non Af Amer 01/01/2016 >60  >60 mL/min Final  . GFR calc Af Amer 01/01/2016 >60  >60 mL/min Final   Comment: (NOTE) The eGFR has been calculated using the CKD EPI equation. This calculation  has not been validated in all clinical situations. eGFR's persistently <60 mL/min signify possible Chronic Kidney Disease.   . Anion gap 01/01/2016 8  5 - 15 Final  . Magnesium 01/01/2016 1.2* 1.7 - 2.4 mg/dL Final  Admission on 12/28/2015, Discharged on 12/31/2015  Component Date Value Ref Range Status  . Sodium 12/29/2015 129* 135 - 145 mmol/L Final  . Potassium 12/29/2015 3.2* 3.5 - 5.1 mmol/L Final  . Chloride 12/29/2015 95* 101 - 111 mmol/L Final  . CO2 12/29/2015 27  22 - 32 mmol/L Final  . Glucose, Bld 12/29/2015 104* 65 - 99 mg/dL Final  . BUN 12/29/2015 6  6 - 20 mg/dL Final  . Creatinine, Ser 12/29/2015 0.66  0.61 - 1.24 mg/dL Final  . Calcium 12/29/2015 7.9* 8.9 - 10.3 mg/dL Final  . GFR calc non Af Amer 12/29/2015 >60  >60 mL/min Final  . GFR calc Af Amer 12/29/2015 >60  >60 mL/min Final   Comment: (NOTE) The eGFR has been calculated using the CKD EPI equation. This calculation has not been validated in all clinical situations. eGFR's persistently <60 mL/min signify possible Chronic Kidney Disease.   . Anion gap 12/29/2015 7  5 - 15 Final  . WBC 12/29/2015 9.0  3.8 - 10.6 K/uL Final  . RBC 12/29/2015 3.19* 4.40 - 5.90 MIL/uL Final  . Hemoglobin 12/29/2015 9.6* 13.0 - 18.0 g/dL Final  . HCT 12/29/2015 26.6* 40.0 - 52.0 % Final  . MCV 12/29/2015 83.3  80.0 - 100.0 fL Final  . MCH 12/29/2015 30.0  26.0 - 34.0 pg Final  . MCHC 12/29/2015 36.0  32.0 - 36.0 g/dL Final  . RDW 12/29/2015 18.4* 11.5 - 14.5 % Final  . Platelets 12/29/2015 214  150 - 440 K/uL Final  . Glucose-Capillary 12/28/2015 167* 65 - 99 mg/dL Final  . Comment 1 12/28/2015 Notify RN   Final  . Comment 2 12/28/2015 Document in Chart   Final  . Glucose-Capillary 12/28/2015 105* 65 - 99 mg/dL Final  . Glucose Fasting, POC 12/29/2015 110* 70 - 99 mg/dL Final  . Glucose-Capillary 12/29/2015 110* 65 - 99 mg/dL Final  .  Glucose-Capillary 12/29/2015 97  65 - 99 mg/dL Final  . Glucose-Capillary 12/29/2015 107*  65 - 99 mg/dL Final  . Sodium 12/30/2015 132* 135 - 145 mmol/L Final  . Potassium 12/30/2015 3.6  3.5 - 5.1 mmol/L Final  . Chloride 12/30/2015 96* 101 - 111 mmol/L Final  . CO2 12/30/2015 29  22 - 32 mmol/L Final  . Glucose, Bld 12/30/2015 99  65 - 99 mg/dL Final  . BUN 12/30/2015 6  6 - 20 mg/dL Final  . Creatinine, Ser 12/30/2015 0.62  0.61 - 1.24 mg/dL Final  . Calcium 12/30/2015 8.1* 8.9 - 10.3 mg/dL Final  . GFR calc non Af Amer 12/30/2015 >60  >60 mL/min Final  . GFR calc Af Amer 12/30/2015 >60  >60 mL/min Final   Comment: (NOTE) The eGFR has been calculated using the CKD EPI equation. This calculation has not been validated in all clinical situations. eGFR's persistently <60 mL/min signify possible Chronic Kidney Disease.   . Anion gap 12/30/2015 7  5 - 15 Final  . Glucose-Capillary 12/29/2015 93  65 - 99 mg/dL Final  . Glucose-Capillary 12/30/2015 102* 65 - 99 mg/dL Final  . Comment 1 12/30/2015 Notify RN   Final  . Comment 2 12/30/2015 Document in Chart   Final  . Glucose-Capillary 12/30/2015 98  65 - 99 mg/dL Final  . Glucose-Capillary 12/30/2015 94  65 - 99 mg/dL Final  . Comment 1 12/30/2015 Notify RN   Final  . Comment 2 12/30/2015 Document in Chart   Final  . Glucose-Capillary 12/30/2015 96  65 - 99 mg/dL Final  . Glucose-Capillary 12/31/2015 100* 65 - 99 mg/dL Final  . Glucose-Capillary 12/31/2015 103* 65 - 99 mg/dL Final  . Glucose-Capillary 12/31/2015 94  65 - 99 mg/dL Final    Assessment:  Kent [name] is a 60 y.o. 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  Tumor was p16 IHC positive (high risk HPV).  CT soft tissue neck on 08/17/2015 revealed a slowly progressive over years, infiltrative, deep 4.1 x 5.4 cm LEFT parotid mass with widespread ipsilateral bulky adenopathy.  PET scan at [facility] on 09/17/2015 revealed left base of tongue malignancy corresponding to asymmetric enhancement identified on the neck  CT with hypermetabolic extensive conglomerate left cervical adenopathy (level II-IV) extending from the level of the angle of the mandible down inferiorly to the level of the cricoid cartilage.  There was clustered subcentimeter left level IB submandibular nodes with mild FDG activity.  There was no evidence of metastatic disease.  Alpha gal IgE was 1.21 (< 0.35) c/w IgE antibodies to galactose-alpha-1,3 galactose (oligosaccharide part on Fab portion of the cetuximab heavy chain) and suggestive of potential cetuximab reaction.  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  Pathologic stage was T4NxM0.  He took imatinib for 1 year.  He is day 28 of cycle #3 cisplatin (10/22/2015 - 12/04/2015) with concurrent radiation.  Radiation completed on 12/21/2015.  Left neck adenopathy has dramatically improved.  He has recurrent hyponatremia. He has received daily IVF with electrolyte supplementation every day during clinic.  Yesterday he left early because of his frustration.  He was admitted overnight to [facility] on 11/15/2015 for hyponatremia (sodium 122).  He received IVF overnight.  He was admitted to [facility] from 12/08/2015 - 12/09/2015 for hydration and electrolyte replacement. Sodium was  126.  He was readmitted to [facility] from 12/09/2015 - 12/12/2015 secondary to nausea, vomiting, and minimal oral intake.  He received IVF with electrolyte replacement and anti-emetics.  A PICC line was placed for continuation of fluids in the outpatient department.  He was admitted to [facility] from 12/28/2015 - 12/31/2015 with dehydration and hyponatremia.  He received IVF.  He was discovered to have dry gangrene of the right second toe.  Angiogram revealed good perfusion. He was to follow-up with Dr. Lucky Cowboy in the outpatient department.  Symptomatically, his oral intake remains poor.  Exam reveals improving left neck  aenopathy and mucositis post radiation.  He has dry gangrene of the right second toe.  He has hyponatremia (125) and hypomagnesemia (1.2).    Plan: 1.  Labs today:  BMP, Mg. 2.  Discuss importance of oral fluid intake as well as caloric support. Discuss plan for daily IV fluids with lab checks (BMP and magnesium) and electrolyte supplementation as needed.  Discuss keeping a of fluid and caloric diary.  Discuss possible admission to the hospital on Friday, 01/04/2016, if patient needs unable to meet fluid and caloric goals. 3.  1 liter NS + 3 gm magnesium sulfate IV today. 4.  Discuss dry gangrene and management.  Follow-up with vascular surgery. 5.  Rx:  Viscous lidocaine 15 ml po q 6 hours prn oral pain. 6.  RTC daily this week for labs (BMP, Mg) +/- IVF + electrolytes 7.  RTC on 01/04/2016 for MD assessment, labs, fluids and decision regarding need for weekend admission.   Lequita Asal, MD  01/01/2016, 11:07 AM

## 2016-01-02 ENCOUNTER — Inpatient Hospital Stay: Payer: Managed Care, Other (non HMO)

## 2016-01-02 ENCOUNTER — Inpatient Hospital Stay: Payer: Managed Care, Other (non HMO) | Attending: Hematology and Oncology

## 2016-01-02 VITALS — BP 108/74 | HR 110 | Temp 97.8°F | Resp 18

## 2016-01-02 DIAGNOSIS — E871 Hypo-osmolality and hyponatremia: Secondary | ICD-10-CM

## 2016-01-02 DIAGNOSIS — Z9181 History of falling: Secondary | ICD-10-CM | POA: Insufficient documentation

## 2016-01-02 DIAGNOSIS — R42 Dizziness and giddiness: Secondary | ICD-10-CM | POA: Diagnosis not present

## 2016-01-02 DIAGNOSIS — M129 Arthropathy, unspecified: Secondary | ICD-10-CM | POA: Diagnosis not present

## 2016-01-02 DIAGNOSIS — R531 Weakness: Secondary | ICD-10-CM | POA: Insufficient documentation

## 2016-01-02 DIAGNOSIS — I10 Essential (primary) hypertension: Secondary | ICD-10-CM | POA: Insufficient documentation

## 2016-01-02 DIAGNOSIS — Z9221 Personal history of antineoplastic chemotherapy: Secondary | ICD-10-CM | POA: Insufficient documentation

## 2016-01-02 DIAGNOSIS — R630 Anorexia: Secondary | ICD-10-CM | POA: Insufficient documentation

## 2016-01-02 DIAGNOSIS — Z800 Family history of malignant neoplasm of digestive organs: Secondary | ICD-10-CM | POA: Diagnosis not present

## 2016-01-02 DIAGNOSIS — Z79899 Other long term (current) drug therapy: Secondary | ICD-10-CM | POA: Insufficient documentation

## 2016-01-02 DIAGNOSIS — C7989 Secondary malignant neoplasm of other specified sites: Secondary | ICD-10-CM | POA: Diagnosis not present

## 2016-01-02 DIAGNOSIS — C01 Malignant neoplasm of base of tongue: Secondary | ICD-10-CM | POA: Insufficient documentation

## 2016-01-02 DIAGNOSIS — E119 Type 2 diabetes mellitus without complications: Secondary | ICD-10-CM | POA: Insufficient documentation

## 2016-01-02 DIAGNOSIS — Z809 Family history of malignant neoplasm, unspecified: Secondary | ICD-10-CM | POA: Insufficient documentation

## 2016-01-02 DIAGNOSIS — E876 Hypokalemia: Secondary | ICD-10-CM | POA: Insufficient documentation

## 2016-01-02 DIAGNOSIS — E669 Obesity, unspecified: Secondary | ICD-10-CM | POA: Diagnosis not present

## 2016-01-02 DIAGNOSIS — R5383 Other fatigue: Secondary | ICD-10-CM | POA: Diagnosis not present

## 2016-01-02 DIAGNOSIS — Z85028 Personal history of other malignant neoplasm of stomach: Secondary | ICD-10-CM | POA: Diagnosis not present

## 2016-01-02 DIAGNOSIS — Z7982 Long term (current) use of aspirin: Secondary | ICD-10-CM | POA: Diagnosis not present

## 2016-01-02 DIAGNOSIS — E785 Hyperlipidemia, unspecified: Secondary | ICD-10-CM | POA: Insufficient documentation

## 2016-01-02 DIAGNOSIS — R000 Tachycardia, unspecified: Secondary | ICD-10-CM | POA: Diagnosis not present

## 2016-01-02 DIAGNOSIS — Z923 Personal history of irradiation: Secondary | ICD-10-CM | POA: Insufficient documentation

## 2016-01-02 LAB — BASIC METABOLIC PANEL
Anion gap: 9 (ref 5–15)
BUN: 12 mg/dL (ref 6–20)
CO2: 24 mmol/L (ref 22–32)
Calcium: 8.3 mg/dL — ABNORMAL LOW (ref 8.9–10.3)
Chloride: 94 mmol/L — ABNORMAL LOW (ref 101–111)
Creatinine, Ser: 0.8 mg/dL (ref 0.61–1.24)
GFR calc Af Amer: >60 mL/min (ref >60)
GFR calc non Af Amer: >60 mL/min (ref >60)
Glucose, Bld: 184 mg/dL — ABNORMAL HIGH (ref 65–99)
Potassium: 4 mmol/L (ref 3.5–5.1)
Sodium: 127 mmol/L — ABNORMAL LOW (ref 135–145)

## 2016-01-02 LAB — MAGNESIUM: Magnesium: 1.6 mg/dL — ABNORMAL LOW (ref 1.7–2.4)

## 2016-01-02 MED ORDER — HEPARIN SOD (PORK) LOCK FLUSH 100 UNIT/ML IV SOLN
500.0000 [IU] | Freq: Once | INTRAVENOUS | Status: AC | PRN
  Administered 2016-01-02: 500 [IU]

## 2016-01-02 MED ORDER — SODIUM CHLORIDE 0.9 % IV SOLN
Freq: Once | INTRAVENOUS | Status: AC
  Administered 2016-01-02: 09:00:00 via INTRAVENOUS
  Filled 2016-01-02: qty 1000

## 2016-01-03 ENCOUNTER — Inpatient Hospital Stay: Payer: Managed Care, Other (non HMO)

## 2016-01-03 VITALS — BP 114/76 | HR 121 | Temp 99.3°F | Resp 18

## 2016-01-03 DIAGNOSIS — E871 Hypo-osmolality and hyponatremia: Secondary | ICD-10-CM

## 2016-01-03 DIAGNOSIS — C01 Malignant neoplasm of base of tongue: Secondary | ICD-10-CM | POA: Diagnosis not present

## 2016-01-03 LAB — MAGNESIUM: Magnesium: 1.3 mg/dL — ABNORMAL LOW (ref 1.7–2.4)

## 2016-01-03 LAB — BASIC METABOLIC PANEL
Anion gap: 7 (ref 5–15)
BUN: 10 mg/dL (ref 6–20)
CO2: 27 mmol/L (ref 22–32)
Calcium: 8.1 mg/dL — ABNORMAL LOW (ref 8.9–10.3)
Chloride: 91 mmol/L — ABNORMAL LOW (ref 101–111)
Creatinine, Ser: 0.65 mg/dL (ref 0.61–1.24)
GFR calc Af Amer: >60 mL/min (ref >60)
GFR calc non Af Amer: >60 mL/min (ref >60)
Glucose, Bld: 165 mg/dL — ABNORMAL HIGH (ref 65–99)
Potassium: 4 mmol/L (ref 3.5–5.1)
Sodium: 125 mmol/L — ABNORMAL LOW (ref 135–145)

## 2016-01-03 MED ORDER — HEPARIN SOD (PORK) LOCK FLUSH 100 UNIT/ML IV SOLN
250.0000 [IU] | Freq: Once | INTRAVENOUS | Status: AC | PRN
  Administered 2016-01-03: 250 [IU]
  Filled 2016-01-03: qty 5

## 2016-01-03 MED ORDER — SODIUM CHLORIDE 0.9 % IV SOLN
INTRAVENOUS | Status: AC
  Administered 2016-01-03: 10:00:00 via INTRAVENOUS
  Filled 2016-01-03: qty 1000

## 2016-01-03 MED ORDER — SODIUM CHLORIDE 0.9 % IJ SOLN
10.0000 mL | INTRAMUSCULAR | Status: DC | PRN
  Administered 2016-01-03: 10 mL
  Filled 2016-01-03: qty 10

## 2016-01-03 NOTE — Discharge Summary (Signed)
[facility] [place] NAME: Kent [name]    MR#:  [id number]  DATE OF BIRTH:  [birth year]  DATE OF ADMISSION:  12/28/2015 ADMITTING PHYSICIAN: Gladstone Lighter, MD  DATE OF DISCHARGE: 12/31/2015  5:52 PM  PRIMARY CARE PHYSICIAN: Kathrine Haddock, NP   ADMISSION DIAGNOSIS:  tonsilar cancer dehydration hyponatremia hypokalemia hypomagnesemia gangrene right second toe  DISCHARGE DIAGNOSIS:  Active Problems:   Hyponatremia   SECONDARY DIAGNOSIS:   Past Medical History:  Diagnosis Date  . Arthritis   . Diabetes mellitus without complication ([place])   . Hyperlipidemia   . Hypertension   . Obesity   . 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 Patient already had 2 hospitalizations this month for this same reason. The last hospitalization 2 weeks ago, PICC line was placed so he can get IV fluids at the cancer center. This kept him off hospitalization for the last 2 weeks. Again his intake has been decreasing, he appears weak and labs today reveal low sodium and potassium and magnesium so sent to hospital for further IV fluids. He was also noted to have a gangrenous tip of his right second toe which he was not aware of. Complains of some pain and tenderness, no sensory or motor loss. Denies any nausea or vomiting. Still has somewhat dying aphasia and taking soft foods and liquids at home. No fevers chills, diarrhea or chest pain or difficulty breathing  HOSPITAL COURSE:   Anil Havard a 60 y.o. 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  # Hyponatremia with hypokalemia and hypomagnesemia-has a PICC line. Continued on IV and oral supplements with fluids. Repeat labs showed return to baseline. He does have chronic hyponatremia.  He has had recurrent admissions due to hyponatremia from decreased oral intake. Patient will need PEG tube placement if he is unable to keep up with his nutritional and fluid needs.  # Gangrenous right second toe-well demarcated at this time. Angiogram showed good perfusion. Likely small vessel disease. Follow-up as outpatient with vascular surgery. Discussed with Dr. dew.  # diabetes mellitus-with hypoglycemia  Lantus was discontinued at discharge last admission. Continue metformin.  # squamous cell carcinoma of the tongue/tonsil-finished radiation. Oncology on board  # hypertension-lisinopril  # GERD-add Protonix. Already on sucralfate  Stable for discharge home to follow-up with vascular surgery and oncology.  Encourage patient to drink plenty of fluids after discharge.  CONSULTS OBTAINED:  Treatment Team:  Lequita Asal, MD Algernon Huxley, MD  DRUG ALLERGIES:  No Known Allergies  DISCHARGE MEDICATIONS:   Discharge Medication List as of 12/31/2015  5:12 PM    START taking these medications   Details  aspirin 81 MG tablet Take 1 tablet (81 mg total) by mouth daily., Starting Mon 12/31/2015, OTC    lidocaine (XYLOCAINE) 2 % solution Use as directed  15 mLs in the mouth or throat every 6 (six) hours as needed for mouth pain., Starting Mon 12/31/2015, Print      CONTINUE these medications which have CHANGED   Details  oxyCODONE-acetaminophen (PERCOCET/ROXICET) 5-325 MG tablet Take 1 tablet by mouth  every 6 (six) hours as needed for severe pain., Starting Mon 12/31/2015, Print      CONTINUE these medications which have NOT CHANGED   Details  dextromethorphan-guaiFENesin (MUCINEX DM) 30-600 MG 12hr tablet Take 1 tablet by mouth 2 (two) times daily., Historical Med    feeding supplement (BOOST / RESOURCE BREEZE) LIQD Take 1 Container by mouth 3 (three) times daily between meals., Starting Wed 12/12/2015, Normal    lisinopril (PRINIVIL,ZESTRIL) 20 MG tablet Take 1 tablet (20 mg total) by mouth daily., Starting Fri 11/16/2015, Normal    LORazepam (ATIVAN) 0.5 MG tablet Take 1 tablet (0.5 mg total) by mouth every 6 (six) hours as needed (Nausea or vomiting)., Starting Mon 10/22/2015, Print    magic mouthwash SOLN Take 5 mLs by mouth 4 (four) times daily as needed for mouth pain., Starting Mon 12/03/2015, Print    magnesium oxide (MAG-OX) 400 (241.3 Mg) MG tablet Take 1 tablet (400 mg total) by mouth daily., Starting Wed 12/12/2015, Normal    meloxicam (MOBIC) 15 MG tablet Take 1 tablet (15 mg total) by mouth daily., Starting Mon 05/07/2015, Print    menthol-cetylpyridinium (CEPACOL) 3 MG lozenge Take 1 lozenge (3 mg total) by mouth as needed for sore throat., Starting Fri 11/16/2015, Normal    metFORMIN (GLUCOPHAGE) 1000 MG tablet Take 1 tablet (1,000 mg total) by mouth 2 (two) times daily with a meal., Starting Fri 04/06/2015, Normal    ondansetron (ZOFRAN) 8 MG tablet Take 1 tablet (8 mg total) by mouth 2 (two) times daily as needed. Start on the third day after chemotherapy., Starting Mon 10/22/2015, Normal    potassium chloride (KLOR-CON) 20 MEQ packet Take 40 mEq by mouth 2 (two) times daily., Starting Fri 12/21/2015, Normal    prochlorperazine (COMPAZINE) 10 MG tablet Take 1 tablet (10 mg total) by mouth every 6 (six) hours as needed (Nausea or vomiting)., Starting Tue 11/20/2015, Normal    sucralfate (CARAFATE) 1 g tablet Take 1 tablet (1 g total) by mouth 3 (three) times daily. Dissolve in  2 to 3 tbs warm water, swish &swallow, Starting Thu 11/01/2015, Normal    dexamethasone (DECADRON) 2 MG tablet Take 2 mg by mouth 2 (two) times daily., Historical Med      STOP taking these medications     atorvastatin (LIPITOR) 20 MG tablet      cyclobenzaprine (FLEXERIL) 10 MG tablet      fluconazole (DIFLUCAN) 100 MG tablet      sodium chloride 1 g tablet         Today   VITAL SIGNS:  Blood pressure (!) 168/98, pulse 100, temperature 98.5 F (36.9 C), temperature source Oral, resp. rate 16, height '5\' 7"'$  (1.702 m), weight 75.6 kg (166 lb 11.2 oz), SpO2 98 %.  I/O:  No intake or output data in the 24 hours ending 01/03/16 1553  PHYSICAL EXAMINATION:  Physical Exam  GENERAL:  60 y.o.-year-old patient lying in the bed with no acute distress.  LUNGS: Normal breath sounds bilaterally, no wheezing, rales,rhonchi or crepitation. No use of accessory muscles of respiration.  CARDIOVASCULAR: S1, S2 normal. No murmurs, rubs, or gallops.  ABDOMEN: Soft, non-tender, non-distended. Bowel sounds present. No organomegaly or mass.  NEUROLOGIC: Moves all  4 extremities. PSYCHIATRIC: The patient is alert and oriented x 3.  SKIN: No obvious rash, lesion, or ulcer.   DATA REVIEW:   CBC  Recent Labs Lab 12/29/15 0604  WBC 9.0  HGB 9.6*  HCT 26.6*  PLT 214    Chemistries   Recent Labs Lab 01/03/16 0856  NA 125*  K 4.0  CL 91*  CO2 27  GLUCOSE 165*  BUN 10  CREATININE 0.65  CALCIUM 8.1*  MG 1.3*    Cardiac Enzymes No results for input(s): TROPONINI in the last 168 hours.  Microbiology Results  Results for orders placed or performed during the hospital encounter of 12/09/15  Culture, blood (Routine X 2) w Reflex to ID Panel     Status: None   Collection Time: 12/10/15  1:39 AM  Result Value Ref Range Status   Specimen Description BLOOD RIGHT ANTECUBITAL  Final   Special Requests BOTTLES DRAWN AEROBIC AND ANAEROBIC 10CC  Final   Culture NO GROWTH 5 DAYS  Final    Report Status 12/15/2015 FINAL  Final  Culture, blood (Routine X 2) w Reflex to ID Panel     Status: None   Collection Time: 12/10/15  1:39 AM  Result Value Ref Range Status   Specimen Description BLOOD LEFT ANTECUBITAL  Final   Special Requests BOTTLES DRAWN AEROBIC AND ANAEROBIC [place]  Final   Culture NO GROWTH 5 DAYS  Final   Report Status 12/15/2015 FINAL  Final    RADIOLOGY:  No results found.  Follow up with PCP in 1 week.  Management plans discussed with the patient, family and they are in agreement.  CODE STATUS:  Code Status History    Date Active Date Inactive Code Status Order ID Comments User Context   12/28/2015  1:43 PM 12/31/2015  8:57 PM Full Code [id number]  Gladstone Lighter, MD Inpatient   12/10/2015  1:43 AM 12/12/2015  5:07 PM Full Code [id number]  Lance Coon, MD ED   12/07/2015  5:13 PM 12/08/2015  5:39 PM Full Code [id number]  Hillary Bow, MD Inpatient   11/15/2015 12:19 PM 11/16/2015  8:00 PM Full Code [id number]  Dustin Flock, MD Inpatient      TOTAL TIME TAKING CARE OF THIS PATIENT ON DAY OF DISCHARGE: more than 30 minutes.   Hillary Bow R M.D on 01/03/2016 at 3:53 PM  Between 7am to 6pm - Pager - [phone]  After 6pm go to [email or web address] - password [email or web address] [facility]  Office  [phone]  CC: Primary care physician; Kathrine Haddock, NP  Note: This dictation was prepared with Dragon dictation along with smaller phrase technology. Any transcriptional errors that result from this process are unintentional.

## 2016-01-04 ENCOUNTER — Inpatient Hospital Stay: Payer: Managed Care, Other (non HMO) | Admitting: *Deleted

## 2016-01-04 ENCOUNTER — Encounter: Payer: Self-pay | Admitting: Hematology and Oncology

## 2016-01-04 ENCOUNTER — Inpatient Hospital Stay (HOSPITAL_BASED_OUTPATIENT_CLINIC_OR_DEPARTMENT_OTHER): Payer: Managed Care, Other (non HMO) | Admitting: Internal Medicine

## 2016-01-04 ENCOUNTER — Inpatient Hospital Stay: Payer: Managed Care, Other (non HMO)

## 2016-01-04 VITALS — BP 132/95 | HR 147 | Temp 100.5°F | Resp 18 | Wt 163.8 lb

## 2016-01-04 DIAGNOSIS — R42 Dizziness and giddiness: Secondary | ICD-10-CM

## 2016-01-04 DIAGNOSIS — E871 Hypo-osmolality and hyponatremia: Secondary | ICD-10-CM

## 2016-01-04 DIAGNOSIS — C7989 Secondary malignant neoplasm of other specified sites: Secondary | ICD-10-CM | POA: Diagnosis not present

## 2016-01-04 DIAGNOSIS — I10 Essential (primary) hypertension: Secondary | ICD-10-CM

## 2016-01-04 DIAGNOSIS — Z923 Personal history of irradiation: Secondary | ICD-10-CM

## 2016-01-04 DIAGNOSIS — E860 Dehydration: Secondary | ICD-10-CM

## 2016-01-04 DIAGNOSIS — Z9181 History of falling: Secondary | ICD-10-CM

## 2016-01-04 DIAGNOSIS — C01 Malignant neoplasm of base of tongue: Secondary | ICD-10-CM | POA: Diagnosis not present

## 2016-01-04 DIAGNOSIS — H9312 Tinnitus, left ear: Secondary | ICD-10-CM

## 2016-01-04 DIAGNOSIS — R531 Weakness: Secondary | ICD-10-CM

## 2016-01-04 DIAGNOSIS — Z809 Family history of malignant neoplasm, unspecified: Secondary | ICD-10-CM

## 2016-01-04 DIAGNOSIS — Z800 Family history of malignant neoplasm of digestive organs: Secondary | ICD-10-CM

## 2016-01-04 DIAGNOSIS — M129 Arthropathy, unspecified: Secondary | ICD-10-CM

## 2016-01-04 DIAGNOSIS — E876 Hypokalemia: Secondary | ICD-10-CM

## 2016-01-04 DIAGNOSIS — I96 Gangrene, not elsewhere classified: Secondary | ICD-10-CM | POA: Insufficient documentation

## 2016-01-04 DIAGNOSIS — E785 Hyperlipidemia, unspecified: Secondary | ICD-10-CM

## 2016-01-04 DIAGNOSIS — R000 Tachycardia, unspecified: Secondary | ICD-10-CM

## 2016-01-04 DIAGNOSIS — Z79899 Other long term (current) drug therapy: Secondary | ICD-10-CM

## 2016-01-04 DIAGNOSIS — E119 Type 2 diabetes mellitus without complications: Secondary | ICD-10-CM

## 2016-01-04 DIAGNOSIS — R5383 Other fatigue: Secondary | ICD-10-CM

## 2016-01-04 DIAGNOSIS — Z7982 Long term (current) use of aspirin: Secondary | ICD-10-CM

## 2016-01-04 DIAGNOSIS — R630 Anorexia: Secondary | ICD-10-CM

## 2016-01-04 DIAGNOSIS — Z9221 Personal history of antineoplastic chemotherapy: Secondary | ICD-10-CM

## 2016-01-04 DIAGNOSIS — E669 Obesity, unspecified: Secondary | ICD-10-CM

## 2016-01-04 DIAGNOSIS — Z85028 Personal history of other malignant neoplasm of stomach: Secondary | ICD-10-CM

## 2016-01-04 LAB — COMPREHENSIVE METABOLIC PANEL
ALT: 21 U/L (ref 17–63)
AST: 24 U/L (ref 15–41)
Albumin: 2.6 g/dL — ABNORMAL LOW (ref 3.5–5.0)
Alkaline Phosphatase: 99 U/L (ref 38–126)
Anion gap: 10 (ref 5–15)
BUN: 15 mg/dL (ref 6–20)
CO2: 25 mmol/L (ref 22–32)
Calcium: 8.3 mg/dL — ABNORMAL LOW (ref 8.9–10.3)
Chloride: 91 mmol/L — ABNORMAL LOW (ref 101–111)
Creatinine, Ser: 0.57 mg/dL — ABNORMAL LOW (ref 0.61–1.24)
GFR calc Af Amer: >60 mL/min (ref >60)
GFR calc non Af Amer: >60 mL/min (ref >60)
Glucose, Bld: 167 mg/dL — ABNORMAL HIGH (ref 65–99)
Potassium: 3.9 mmol/L (ref 3.5–5.1)
Sodium: 126 mmol/L — ABNORMAL LOW (ref 135–145)
Total Bilirubin: 0.7 mg/dL (ref 0.3–1.2)
Total Protein: 5.9 g/dL — ABNORMAL LOW (ref 6.5–8.1)

## 2016-01-04 LAB — MAGNESIUM: Magnesium: 1.5 mg/dL — ABNORMAL LOW (ref 1.7–2.4)

## 2016-01-04 MED ORDER — HEPARIN SOD (PORK) LOCK FLUSH 100 UNIT/ML IV SOLN
500.0000 [IU] | Freq: Once | INTRAVENOUS | Status: AC | PRN
  Administered 2016-01-04: 500 [IU]
  Filled 2016-01-04: qty 5

## 2016-01-04 MED ORDER — MAGNESIUM SULFATE 2 GM/50ML IV SOLN
2.0000 g | Freq: Once | INTRAVENOUS | Status: AC
  Administered 2016-01-04: 2 g via INTRAVENOUS
  Filled 2016-01-04: qty 50

## 2016-01-04 MED ORDER — SODIUM CHLORIDE 0.9 % IV SOLN: 2.0000 g | Freq: Once | INTRAVENOUS | Status: DC

## 2016-01-04 MED ORDER — SODIUM CHLORIDE 0.9 % IJ SOLN
10.0000 mL | INTRAMUSCULAR | Status: DC | PRN
  Filled 2016-01-04: qty 10

## 2016-01-04 MED ORDER — SODIUM CHLORIDE 0.9 % IV SOLN
Freq: Once | INTRAVENOUS | Status: AC
Start: 2016-01-04 — End: 2016-01-04
  Administered 2016-01-04: 10:00:00 via INTRAVENOUS
  Filled 2016-01-04: qty 1000

## 2016-01-04 NOTE — Progress Notes (Signed)
Patient states he is eating a little better. Has been eating soups, puddings and this morning at 1/2 of peanut butter sandwich, a glass of milk  and oatmeal. Also has added protein shakes. Voice seems stronger today.  Temp today 100.5.  BP 133/93 HR 144.  Patient states he is very tired and feels like he could sleep for a month.  Had labs drawn today (CMP/Mag).  C/o left shoulder being sore - unable to straighten arm.  States he slept on in wrong.

## 2016-01-06 NOTE — Progress Notes (Unsigned)
[facility] day:  01/06/16  Chief Complaint: Kent [name] is a 60 y.o. male with stage IVB squamous cell carcinoma of the base of tongue/left tonsil who is seen for reassessment on day 34 of cycle #3 cisplatin and concurrent radiation.  HPI:  The patient was last seen by me in the medical oncology clinic on 01/01/2016.  At that time, his oral intake remained poor.  Exam revealed improving left neck adenopathy and mucositis post radiation.  He had dry gangrene of the right second toe.  He had hyponatremia (125) and hypomagnesemia (1.2).  He received 1 liter of fluids with 3 gm of magnesium.  He was scheduled for daily labs + fluids.  He was encouraged to eat and drink.  Request was made for a daily liquid and food diary.  He was seen by Dr. Sherrine Maples on 01/04/2016 prior to the weekend.  Sodium was 126, potassium 3.9, and magnesium 1.5.    Past Medical History:  Diagnosis Date  . Arthritis   . Diabetes mellitus without complication ([place])   . Hyperlipidemia   . Hypertension   . Obesity   . 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 gastric tumor removed  2010  . PERIPHERAL VASCULAR CATHETERIZATION Right 12/31/2015   Procedure: Lower Extremity Angiography;  Surgeon: Algernon Huxley, MD;  Location: [facility];  Service: Cardiovascular;  Laterality: Right;  . tongue mass biopsy      Family History  Problem Relation Age of Onset  . Heart disease Mother   . Hypertension Father   . Cancer Sister     stomach  . Heart disease Brother     MI  . Cancer Daughter   . Cancer Son     testicular  . ADD / ADHD Sister     Social History:  reports that he has never smoked. He has never used smokeless tobacco. He reports that he does not drink alcohol or use drugs.  He smokes a pipe  and cigar rarely (once every 3 months).  He has a son and daughter.  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  He has planned (and paid for) a trip to the beach from 10/15/2015 - 10/20/2015.  He  has no short term disability.  He has 10 weeks of vacation and sick leave.   Allergies: No Known Allergies  Current Medications: Current Outpatient Prescriptions  Medication Sig Dispense Refill  . aspirin 81 MG tablet Take 1 tablet (81 mg total) by mouth daily. 30 tablet 0  . dextromethorphan-guaiFENesin (MUCINEX DM) 30-600 MG 12hr tablet Take 1 tablet by mouth 2 (two) times daily.    . feeding supplement (BOOST / RESOURCE BREEZE) LIQD Take 1 Container by mouth 3 (three) times daily between meals. 20000 mL 2  . lidocaine (XYLOCAINE) 2 % solution Use as directed 15 mLs in the mouth or throat every 6 (six) hours as needed for mouth pain. 100 mL 0  . lisinopril (PRINIVIL,ZESTRIL) 20 MG tablet Take 1 tablet (20 mg total) by mouth daily. 30 tablet 5  . LORazepam (ATIVAN) 0.5 MG tablet Take 1 tablet (0.5 mg total) by mouth every 6 (six) hours as needed (Nausea or vomiting). 30 tablet 0  .  magic mouthwash SOLN Take 5 mLs by mouth 4 (four) times daily as needed for mouth pain. 480 mL 1  . magnesium oxide (MAG-OX) 400 (241.3 Mg) MG tablet Take 1 tablet (400 mg total) by mouth daily. 60 tablet 2  . meloxicam (MOBIC) 15 MG tablet Take 1 tablet (15 mg total) by mouth daily. 30 tablet 0  . menthol-cetylpyridinium (CEPACOL) 3 MG lozenge Take 1 lozenge (3 mg total) by mouth as needed for sore throat. 100 tablet 12  . metFORMIN (GLUCOPHAGE) 1000 MG tablet Take 1 tablet (1,000 mg total) by mouth 2 (two) times daily with a meal. 180 tablet 3  . ondansetron (ZOFRAN) 8 MG tablet Take 1 tablet (8 mg total) by mouth 2 (two) times daily as needed. Start on the third day after chemotherapy. 30 tablet 1  . oxyCODONE-acetaminophen (PERCOCET/ROXICET) 5-325 MG tablet Take 1 tablet by mouth every 6 (six)  hours as needed for severe pain. 15 tablet 0  . potassium chloride (KLOR-CON) 20 MEQ packet Take 40 mEq by mouth 2 (two) times daily. 60 packet 0  . prochlorperazine (COMPAZINE) 10 MG tablet Take 1 tablet (10 mg total) by mouth every 6 (six) hours as needed (Nausea or vomiting). 30 tablet 1  . sucralfate (CARAFATE) 1 g tablet Take 1 tablet (1 g total) by mouth 3 (three) times daily. Dissolve in 2 to 3 tbs warm water, swish &swallow 90 tablet 3   No current facility-administered medications for this visit.     Review of Systems:  GENERAL:  Fatigue.  No fevers or sweats.  Weight down 20 pounds since 12/04/2015. PERFORMANCE STATUS (ECOG):  1 HEENT:  Vision little blurry secondary to diabetes.  Thick oral secretions.  Slight pain with swallowing.  No runny nose or mouth sores. Lungs:  No shortness of breath or cough.  No hemoptysis. Cardiac:  No chest pain, palpitations, orthopnea, or PND.  GI:  Poor oral intake.  No nausea, vomiting, diarrhea, constipation, melena or hematochezia.  No prior colonoscopy. GU:  No urgency, frequency, dysuria, or hematuria.  Musculoskeletal:  No back pain.  Knee aches.  No muscle tenderness. Extremities:  Right second toe gangrene.  No pain or swelling. Skin:  No rashes or skin changes. Neuro:  No headache, numbness or weakness, balance or coordination issues. Endocrine:  Diabetes.  No thyroid issues, hot flashes or night sweats. Psych:  No mood changes, depression or anxiety. Pain:  Throat pain. Review of systems:  All other systems reviewed and found to be negative.  Physical Exam: There were no vitals taken for this visit. GENERAL:  Chronically fatigued appearing gentleman sitting comfortably in the exam room in no acute distress. MENTAL STATUS:  Alert and oriented to person, place and time. HEAD:  Short gray hair.  Goatee.  Normocephalic, atraumatic, face symmetric, no Cushingoid features. EYES:  Blue eyes.  Pupils equal round and reactive to light and  accomodation.  No conjunctivitis or scleral icterus. ENT:  Oropharynx with thick posterior pharynx secretions.  No thrush.  Tongue normal.  NECK:  Hyperpigmentation left neck with decreased adenopathy s/p radiation.   RESPIRATORY:  Clear to auscultation without rales, wheezes or rhonchi. CARDIOVASCULAR:  Regular rate and rhythm without murmur, rub or gallop. ABDOMEN:  Soft, non-tender, with active bowel sounds, and no hepatosplenomegaly.  No masses. SKIN:  No rashes, ulcers or lesions. EXTREMITIES:  Upper extremity PICC line.  Tip of right second toe black (dry).  No edema, no skin discoloration or tenderness.  No palpable cords.  LYMPH NODES: Soft left neck adenopathy (2 cm).  No palpable supraclavicular, axillary or inguinal adenopathy  NEUROLOGICAL: Unremarkable. PSYCH:  Appropriate.     No visits with results within 3 Day(s) from this visit.  Latest known visit with results is:  Clinical Support on 01/04/2016  Component Date Value Ref Range Status  . Sodium 01/04/2016 126* 135 - 145 mmol/L Final  . Potassium 01/04/2016 3.9  3.5 - 5.1 mmol/L Final  . Chloride 01/04/2016 91* 101 - 111 mmol/L Final  . CO2 01/04/2016 25  22 - 32 mmol/L Final  . Glucose, Bld 01/04/2016 167* 65 - 99 mg/dL Final  . BUN 01/04/2016 15  6 - 20 mg/dL Final  . Creatinine, Ser 01/04/2016 0.57* 0.61 - 1.24 mg/dL Final  . Calcium 01/04/2016 8.3* 8.9 - 10.3 mg/dL Final  . Total Protein 01/04/2016 5.9* 6.5 - 8.1 g/dL Final  . Albumin 01/04/2016 2.6* 3.5 - 5.0 g/dL Final  . AST 01/04/2016 24  15 - 41 U/L Final  . ALT 01/04/2016 21  17 - 63 U/L Final  . Alkaline Phosphatase 01/04/2016 99  38 - 126 U/L Final  . Total Bilirubin 01/04/2016 0.7  0.3 - 1.2 mg/dL Final  . GFR calc non Af Amer 01/04/2016 >60  >60 mL/min Final  . GFR calc Af Amer 01/04/2016 >60  >60 mL/min Final   Comment: (NOTE) The eGFR has been calculated using the CKD EPI equation. This calculation has not been validated in all clinical  situations. eGFR's persistently <60 mL/min signify possible Chronic Kidney Disease.   . Anion gap 01/04/2016 10  5 - 15 Final  . Magnesium 01/04/2016 1.5* 1.7 - 2.4 mg/dL Final    Assessment:  Kent [name] is a 60 y.o. 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  Tumor was p16 IHC positive (high risk HPV).  CT soft tissue neck on 08/17/2015 revealed a slowly progressive over years, infiltrative, deep 4.1 x 5.4 cm LEFT parotid mass with widespread ipsilateral bulky adenopathy.  PET scan at [facility] on 09/17/2015 revealed left base of tongue malignancy corresponding to asymmetric enhancement identified on the neck CT with hypermetabolic extensive conglomerate left cervical adenopathy (level II-IV) extending from the level of the angle of the mandible down inferiorly to the level of the cricoid cartilage.  There was clustered subcentimeter left level IB submandibular nodes with mild FDG activity.  There was no evidence of metastatic disease.  Alpha gal IgE was 1.21 (< 0.35) c/w IgE antibodies to galactose-alpha-1,3 galactose (oligosaccharide part on Fab portion of the cetuximab heavy chain) and suggestive of potential cetuximab reaction.  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  Pathologic stage was T4NxM0.  He took imatinib for 1 year.  He is day 34 of cycle #3 cisplatin (10/22/2015 - 12/04/2015) with concurrent radiation.  Radiation completed on 12/21/2015.  Left neck adenopathy has dramatically improved.  He has recurrent hyponatremia. He has received daily IVF with electrolyte supplementation every day during clinic.  Yesterday he left early because of his frustration.  He was admitted overnight to [facility] on 11/15/2015 for hyponatremia (sodium 122).  He received IVF overnight.  He was admitted to [facility] from 12/08/2015 -  12/09/2015 for hydration and electrolyte replacement. Sodium was 126.  He was readmitted to [facility] from 12/09/2015 - 12/12/2015 secondary to nausea, vomiting, and  minimal oral intake.  He received IVF with electrolyte replacement and anti-emetics.  A PICC line was placed for continuation of fluids in the outpatient department.  He was admitted to [facility] from 12/28/2015 - 12/31/2015 with dehydration and hyponatremia.  He received IVF.  He was discovered to have dry gangrene of the right second toe.  Angiogram revealed good perfusion. He was to follow-up with Dr. Lucky Cowboy in the outpatient department.  Symptomatically, his oral intake remains poor.  Exam reveals improving left neck aenopathy and mucositis post radiation.  He has dry gangrene of the right second toe.  He has hyponatremia (125) and hypomagnesemia (1.2).    Plan: 1.  Labs today:  CBC with diff, BMP, Mg. 2.  Discuss importance of oral fluid intake as well as caloric support. Discuss plan for daily IV fluids with lab checks (BMP and magnesium) and electrolyte supplementation as needed.  Discuss keeping a of fluid and caloric diary.  Discuss possible admission to the hospital on Friday, 01/04/2016, if patient needs unable to meet fluid and caloric goals. 3.  1 liter NS + 3 gm magnesium sulfate IV today. 4.  Discuss dry gangrene and management.  Follow-up with vascular surgery. 5.  Rx:  Viscous lidocaine 15 ml po q 6 hours prn oral pain. 6.  RTC daily this week for labs (BMP, Mg) +/- IVF + electrolytes 7.  RTC on 01/04/2016 for MD assessment, labs, fluids and decision regarding need for weekend admission.   Lequita Asal, MD  01/06/2016, 4:28 PM

## 2016-01-07 ENCOUNTER — Inpatient Hospital Stay: Payer: Managed Care, Other (non HMO)

## 2016-01-07 ENCOUNTER — Encounter: Payer: Self-pay | Admitting: Unknown Physician Specialty

## 2016-01-07 ENCOUNTER — Telehealth: Payer: Self-pay | Admitting: *Deleted

## 2016-01-07 ENCOUNTER — Ambulatory Visit (INDEPENDENT_AMBULATORY_CARE_PROVIDER_SITE_OTHER): Payer: Managed Care, Other (non HMO) | Admitting: Unknown Physician Specialty

## 2016-01-07 ENCOUNTER — Inpatient Hospital Stay: Payer: Managed Care, Other (non HMO) | Admitting: Hematology and Oncology

## 2016-01-07 ENCOUNTER — Other Ambulatory Visit: Payer: Self-pay

## 2016-01-07 ENCOUNTER — Other Ambulatory Visit: Payer: Self-pay | Admitting: *Deleted

## 2016-01-07 VITALS — BP 121/79 | HR 142 | Temp 99.1°F | Ht 67.5 in | Wt 170.4 lb

## 2016-01-07 DIAGNOSIS — Z7189 Other specified counseling: Secondary | ICD-10-CM | POA: Insufficient documentation

## 2016-01-07 DIAGNOSIS — E1165 Type 2 diabetes mellitus with hyperglycemia: Secondary | ICD-10-CM

## 2016-01-07 DIAGNOSIS — G893 Neoplasm related pain (acute) (chronic): Secondary | ICD-10-CM

## 2016-01-07 DIAGNOSIS — R000 Tachycardia, unspecified: Secondary | ICD-10-CM

## 2016-01-07 DIAGNOSIS — E119 Type 2 diabetes mellitus without complications: Secondary | ICD-10-CM

## 2016-01-07 DIAGNOSIS — R0989 Other specified symptoms and signs involving the circulatory and respiratory systems: Secondary | ICD-10-CM

## 2016-01-07 LAB — BAYER DCA HB A1C WAIVED: HB A1C (BAYER DCA - WAIVED): 6.5 % (ref <7.0)

## 2016-01-07 LAB — GLUCOSE HEMOCUE WAIVED: Glu Hemocue Waived: 203 mg/dL — ABNORMAL HIGH (ref 65–99)

## 2016-01-07 LAB — HEMOGLOBIN A1C

## 2016-01-07 NOTE — Telephone Encounter (Signed)
Called patient to inquire why he did not come for appointment today.  Spoke with patient's wife, Jeannene Patella.  She states he was not aware of appointment.  Was not given any information when he left on Friday, according to patient.   Asked if patient can come tomorrow (Tuesday, Nov 7) @ 8:30 for lab/MD and IVF's.  Patient agreed

## 2016-01-07 NOTE — Assessment & Plan Note (Signed)
Asking for refill of Percocet.  I would prefer this done through one provider and receiving through Dr. Mike Gip

## 2016-01-07 NOTE — Assessment & Plan Note (Addendum)
Hgb A1C is 6.5%.  Stay off insulin.  Stop Metformin due to GI side effects

## 2016-01-07 NOTE — Assessment & Plan Note (Addendum)
Discussed with pt and wife.  Pt states he "does not want to be put on life support."  He does want CPR but not be on a respirator.  Offered palliative care and refused. He identifies his wife as the decision maker if he cannot make any decisions

## 2016-01-07 NOTE — Assessment & Plan Note (Signed)
Probably related to dehydration.  Stop Lisinopril.  He states he is drinking fluids

## 2016-01-07 NOTE — Progress Notes (Signed)
BP 121/79 (BP Location: Left Arm, Patient Position: Sitting, Cuff Size: Normal)   Pulse (!) 142   Temp 99.1 F (37.3 C)   Ht 5' 7.5" (1.715 m)   Wt 170 lb 6.4 oz (77.3 kg)   SpO2 99%   BMI 26.29 kg/m    Subjective:    Patient ID: Kent [name], male    DOB: [birth year], 60 y.o.   MRN: [id number]  HPI: Kent [name] is a 60 y.o. male  Chief Complaint  Patient presents with  . Hospitalization Follow-up   Kent [name] is here for hospital f/u of hospitalization.  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  He has been admitted 3 times for dehydration and abnormal lab secondary to poor nutritional intake secondary to side effects of his cancer treatments.  He is being followed closely by oncology    Hospital notes reviewed  Diabetes: Pt has been in the hospital and "just got home" 4 days ago. He is only taking metformin. He reports getting insulin in the hospital but not since he has been home.  He is not checking his blood sugar since he got back.   No hypoglycemic episodes No hyperglycemic episodes Feet problems: Gangrene right foot second toe and f/u scheduled with vascular surgery.    Hypertension  Just taking Lisinopril.   Average home BPs Not checking   Using medication without problems or lightheadedness No chest pain with exertion but is SOB due to exhaustion No Edema  Pain Pt is taking Percocet and would like a refill.  Dr. Mike Gip has been prescribing.    Relevant past medical, surgical, family and social history reviewed and updated as indicated. Interim medical history since our last visit reviewed. Allergies and medications reviewed and updated.  Review of Systems  Per HPI unless specifically indicated above     Objective:    BP 121/79 (BP Location: Left Arm, Patient Position: Sitting, Cuff Size: Normal)   Pulse (!) 142   Temp 99.1 F (37.3 C)   Ht 5' 7.5" (1.715 m)   Wt 170 lb 6.4 oz (77.3 kg)   SpO2 99%   BMI 26.29 kg/m   Wt  Readings from Last 3 Encounters:  01/07/16 170 lb 6.4 oz (77.3 kg)  01/04/16 163 lb 12.8 oz (74.3 kg)  01/01/16 163 lb 5.8 oz (74.1 kg)    Physical Exam  Constitutional: He is cooperative.  Chronically ill appearing  Cardiovascular: An irregularly irregular rhythm present.  Pulmonary/Chest: Breath sounds normal. No respiratory distress.  Abdominal: Soft. Normal appearance and bowel sounds are normal. There is no tenderness.  Neurological: He is alert.  Skin: Skin is warm, dry and intact.  EKG with pulse 136 but PACs and no Afib  Results for orders placed or performed in visit on 01/04/16  Comprehensive metabolic panel  Result Value Ref Range   Sodium 126 (L) 135 - 145 mmol/L   Potassium 3.9 3.5 - 5.1 mmol/L   Chloride 91 (L) 101 - 111 mmol/L   CO2 25 22 - 32 mmol/L   Glucose, Bld 167 (H) 65 - 99 mg/dL   BUN 15 6 - 20 mg/dL   Creatinine, Ser 0.57 (L) 0.61 - 1.24 mg/dL   Calcium 8.3 (L) 8.9 - 10.3 mg/dL   Total Protein 5.9 (L) 6.5 - 8.1 g/dL   Albumin 2.6 (L) 3.5 - 5.0 g/dL   AST 24 15 - 41 U/L   ALT 21 17 -  63 U/L   Alkaline Phosphatase 99 38 - 126 U/L   Total Bilirubin 0.7 0.3 - 1.2 mg/dL   GFR calc non Af Amer >60 >60 mL/min   GFR calc Af Amer >60 >60 mL/min   Anion gap 10 5 - 15  Magnesium  Result Value Ref Range   Magnesium 1.5 (L) 1.7 - 2.4 mg/dL      Assessment & Plan:   Problem List Items Addressed This Visit      Unprioritized   Advance care planning    Discussed with pt and wife.  Pt states he "does not want to be put on life support."  He does want CPR but not be on a respirator.  Offered palliative care and refused. He identifies his wife as the decision maker if he cannot make any decisions       Cancer associated pain    Asking for refill of Percocet.  I would prefer this done through one provider and receiving through Dr. Mike Gip      Controlled type 2 diabetes mellitus without complication ([place])    Hgb A1C is 6.5%.  Stay off insulin.  Stop  Metformin due to GI side effects       Tachycardia    Probably related to dehydration.  Stop Lisinopril.  He states he is drinking fluids       Other Visit Diagnoses    Pulse irregularity    -  Primary   Relevant Orders   EKG 12-Lead (Completed)   Comprehensive metabolic panel       Follow up plan: Return in about 4 weeks (around 02/04/2016).

## 2016-01-08 ENCOUNTER — Inpatient Hospital Stay: Payer: Managed Care, Other (non HMO)

## 2016-01-08 ENCOUNTER — Inpatient Hospital Stay
Admission: AD | Admit: 2016-01-08 | Discharge: 2016-01-10 | DRG: 853 | Disposition: A | Discharge status: Other Acute Inpatient Hospital | Payer: Managed Care, Other (non HMO) | Source: Ambulatory Visit | Attending: Internal Medicine | Admitting: Internal Medicine

## 2016-01-08 ENCOUNTER — Inpatient Hospital Stay (HOSPITAL_BASED_OUTPATIENT_CLINIC_OR_DEPARTMENT_OTHER): Payer: Managed Care, Other (non HMO) | Admitting: Hematology and Oncology

## 2016-01-08 ENCOUNTER — Encounter (INDEPENDENT_AMBULATORY_CARE_PROVIDER_SITE_OTHER): Payer: Managed Care, Other (non HMO) | Admitting: Vascular Surgery

## 2016-01-08 ENCOUNTER — Encounter: Payer: Self-pay | Admitting: Hematology and Oncology

## 2016-01-08 ENCOUNTER — Encounter: Payer: Self-pay | Admitting: Family Medicine

## 2016-01-08 ENCOUNTER — Telehealth: Payer: Self-pay | Admitting: *Deleted

## 2016-01-08 VITALS — BP 111/78 | HR 125 | Temp 100.9°F | Resp 18 | Wt 174.2 lb

## 2016-01-08 DIAGNOSIS — E860 Dehydration: Secondary | ICD-10-CM | POA: Diagnosis present

## 2016-01-08 DIAGNOSIS — Z85028 Personal history of other malignant neoplasm of stomach: Secondary | ICD-10-CM

## 2016-01-08 DIAGNOSIS — M129 Arthropathy, unspecified: Secondary | ICD-10-CM

## 2016-01-08 DIAGNOSIS — Z809 Family history of malignant neoplasm, unspecified: Secondary | ICD-10-CM | POA: Diagnosis not present

## 2016-01-08 DIAGNOSIS — E119 Type 2 diabetes mellitus without complications: Secondary | ICD-10-CM | POA: Diagnosis not present

## 2016-01-08 DIAGNOSIS — Z923 Personal history of irradiation: Secondary | ICD-10-CM

## 2016-01-08 DIAGNOSIS — L0390 Cellulitis, unspecified: Secondary | ICD-10-CM | POA: Diagnosis present

## 2016-01-08 DIAGNOSIS — C01 Malignant neoplasm of base of tongue: Secondary | ICD-10-CM

## 2016-01-08 DIAGNOSIS — Z794 Long term (current) use of insulin: Secondary | ICD-10-CM | POA: Diagnosis not present

## 2016-01-08 DIAGNOSIS — Z79899 Other long term (current) drug therapy: Secondary | ICD-10-CM

## 2016-01-08 DIAGNOSIS — R630 Anorexia: Secondary | ICD-10-CM

## 2016-01-08 DIAGNOSIS — E871 Hypo-osmolality and hyponatremia: Secondary | ICD-10-CM

## 2016-01-08 DIAGNOSIS — I96 Gangrene, not elsewhere classified: Secondary | ICD-10-CM

## 2016-01-08 DIAGNOSIS — A4101 Sepsis due to Methicillin susceptible Staphylococcus aureus: Secondary | ICD-10-CM | POA: Diagnosis present

## 2016-01-08 DIAGNOSIS — E440 Moderate protein-calorie malnutrition: Secondary | ICD-10-CM | POA: Diagnosis present

## 2016-01-08 DIAGNOSIS — E43 Unspecified severe protein-calorie malnutrition: Secondary | ICD-10-CM

## 2016-01-08 DIAGNOSIS — E785 Hyperlipidemia, unspecified: Secondary | ICD-10-CM | POA: Diagnosis present

## 2016-01-08 DIAGNOSIS — E876 Hypokalemia: Secondary | ICD-10-CM

## 2016-01-08 DIAGNOSIS — Z6825 Body mass index (BMI) 25.0-25.9, adult: Secondary | ICD-10-CM | POA: Diagnosis not present

## 2016-01-08 DIAGNOSIS — Z800 Family history of malignant neoplasm of digestive organs: Secondary | ICD-10-CM

## 2016-01-08 DIAGNOSIS — R5081 Fever presenting with conditions classified elsewhere: Secondary | ICD-10-CM

## 2016-01-08 DIAGNOSIS — E1152 Type 2 diabetes mellitus with diabetic peripheral angiopathy with gangrene: Secondary | ICD-10-CM | POA: Diagnosis present

## 2016-01-08 DIAGNOSIS — E878 Other disorders of electrolyte and fluid balance, not elsewhere classified: Secondary | ICD-10-CM

## 2016-01-08 DIAGNOSIS — Z7982 Long term (current) use of aspirin: Secondary | ICD-10-CM | POA: Diagnosis not present

## 2016-01-08 DIAGNOSIS — D638 Anemia in other chronic diseases classified elsewhere: Secondary | ICD-10-CM | POA: Diagnosis present

## 2016-01-08 DIAGNOSIS — L03818 Cellulitis of other sites: Secondary | ICD-10-CM

## 2016-01-08 DIAGNOSIS — E669 Obesity, unspecified: Secondary | ICD-10-CM | POA: Diagnosis present

## 2016-01-08 DIAGNOSIS — L0291 Cutaneous abscess, unspecified: Secondary | ICD-10-CM

## 2016-01-08 DIAGNOSIS — Z66 Do not resuscitate: Secondary | ICD-10-CM | POA: Diagnosis present

## 2016-01-08 DIAGNOSIS — I724 Aneurysm of artery of lower extremity: Secondary | ICD-10-CM | POA: Diagnosis present

## 2016-01-08 DIAGNOSIS — F329 Major depressive disorder, single episode, unspecified: Secondary | ICD-10-CM | POA: Diagnosis present

## 2016-01-08 DIAGNOSIS — R5383 Other fatigue: Secondary | ICD-10-CM

## 2016-01-08 DIAGNOSIS — Z9181 History of falling: Secondary | ICD-10-CM

## 2016-01-08 DIAGNOSIS — I10 Essential (primary) hypertension: Secondary | ICD-10-CM | POA: Diagnosis present

## 2016-01-08 DIAGNOSIS — R509 Fever, unspecified: Secondary | ICD-10-CM | POA: Diagnosis not present

## 2016-01-08 DIAGNOSIS — Z9221 Personal history of antineoplastic chemotherapy: Secondary | ICD-10-CM | POA: Diagnosis not present

## 2016-01-08 DIAGNOSIS — R42 Dizziness and giddiness: Secondary | ICD-10-CM | POA: Diagnosis not present

## 2016-01-08 DIAGNOSIS — M726 Necrotizing fasciitis: Secondary | ICD-10-CM | POA: Diagnosis present

## 2016-01-08 DIAGNOSIS — Z8249 Family history of ischemic heart disease and other diseases of the circulatory system: Secondary | ICD-10-CM

## 2016-01-08 DIAGNOSIS — C7989 Secondary malignant neoplasm of other specified sites: Secondary | ICD-10-CM | POA: Diagnosis not present

## 2016-01-08 DIAGNOSIS — I70261 Atherosclerosis of native arteries of extremities with gangrene, right leg: Secondary | ICD-10-CM | POA: Diagnosis not present

## 2016-01-08 DIAGNOSIS — D490 Neoplasm of unspecified behavior of digestive system: Secondary | ICD-10-CM | POA: Diagnosis present

## 2016-01-08 DIAGNOSIS — R000 Tachycardia, unspecified: Secondary | ICD-10-CM

## 2016-01-08 DIAGNOSIS — A419 Sepsis, unspecified organism: Secondary | ICD-10-CM | POA: Diagnosis present

## 2016-01-08 DIAGNOSIS — M25512 Pain in left shoulder: Secondary | ICD-10-CM | POA: Diagnosis not present

## 2016-01-08 DIAGNOSIS — R531 Weakness: Secondary | ICD-10-CM

## 2016-01-08 DIAGNOSIS — A480 Gas gangrene: Secondary | ICD-10-CM | POA: Diagnosis present

## 2016-01-08 DIAGNOSIS — Z8043 Family history of malignant neoplasm of testis: Secondary | ICD-10-CM

## 2016-01-08 LAB — MAGNESIUM: Magnesium: 1.4 mg/dL — ABNORMAL LOW (ref 1.7–2.4)

## 2016-01-08 LAB — COMPREHENSIVE METABOLIC PANEL
A/G RATIO: 1.2 (ref 1.2–2.2)
ALBUMIN: 2.8 g/dL — AB (ref 3.6–4.8)
ALK PHOS: 244 IU/L — AB (ref 39–117)
ALT: 84 IU/L — ABNORMAL HIGH (ref 0–44)
AST: 77 IU/L — ABNORMAL HIGH (ref 0–40)
BUN / CREAT RATIO: 18 (ref 10–24)
BUN: 12 mg/dL (ref 8–27)
Bilirubin Total: 1 mg/dL (ref 0.0–1.2)
CALCIUM: 7.9 mg/dL — AB (ref 8.6–10.2)
CO2: 24 mmol/L (ref 18–29)
CREATININE: 0.65 mg/dL — AB (ref 0.76–1.27)
Chloride: 80 mmol/L — ABNORMAL LOW (ref 96–106)
GFR calc Af Amer: 121 mL/min/{1.73_m2} (ref >59)
GFR, EST NON AFRICAN AMERICAN: 105 mL/min/{1.73_m2} (ref >59)
GLOBULIN, TOTAL: 2.4 g/dL (ref 1.5–4.5)
Glucose: 156 mg/dL — ABNORMAL HIGH (ref 65–99)
Potassium: 3.3 mmol/L — ABNORMAL LOW (ref 3.5–5.2)
SODIUM: 125 mmol/L — AB (ref 134–144)
Total Protein: 5.2 g/dL — ABNORMAL LOW (ref 6.0–8.5)

## 2016-01-08 LAB — BASIC METABOLIC PANEL
Anion gap: 10 (ref 5–15)
BUN: 18 mg/dL (ref 6–20)
CO2: 25 mmol/L (ref 22–32)
Calcium: 8 mg/dL — ABNORMAL LOW (ref 8.9–10.3)
Chloride: 90 mmol/L — ABNORMAL LOW (ref 101–111)
Creatinine, Ser: 0.67 mg/dL (ref 0.61–1.24)
GFR calc Af Amer: >60 mL/min (ref >60)
GFR calc non Af Amer: >60 mL/min (ref >60)
Glucose, Bld: 145 mg/dL — ABNORMAL HIGH (ref 65–99)
Potassium: 2.9 mmol/L — ABNORMAL LOW (ref 3.5–5.1)
Sodium: 125 mmol/L — ABNORMAL LOW (ref 135–145)

## 2016-01-08 LAB — CBC WITH DIFFERENTIAL/PLATELET
Basophils Absolute: 0.1 10*3/uL (ref 0–0.1)
Basophils Relative: 1 %
Eosinophils Absolute: 0 10*3/uL (ref 0–0.7)
Eosinophils Relative: 0 %
HCT: 24.5 % — ABNORMAL LOW (ref 40.0–52.0)
Hemoglobin: 8.4 g/dL — ABNORMAL LOW (ref 13.0–18.0)
Lymphocytes Relative: 2 %
Lymphs Abs: 0.3 10*3/uL — ABNORMAL LOW (ref 1.0–3.6)
MCH: 28.8 pg (ref 26.0–34.0)
MCHC: 34.4 g/dL (ref 32.0–36.0)
MCV: 83.7 fL (ref 80.0–100.0)
Monocytes Absolute: 1.3 10*3/uL — ABNORMAL HIGH (ref 0.2–1.0)
Monocytes Relative: 7 %
Neutro Abs: 17.5 10*3/uL — ABNORMAL HIGH (ref 1.4–6.5)
Neutrophils Relative %: 90 %
Platelets: 217 10*3/uL (ref 150–440)
RBC: 2.93 MIL/uL — ABNORMAL LOW (ref 4.40–5.90)
RDW: 17.2 % — ABNORMAL HIGH (ref 11.5–14.5)
WBC: 19.2 10*3/uL — ABNORMAL HIGH (ref 3.8–10.6)

## 2016-01-08 LAB — FERRITIN: Ferritin: 1927 ng/mL — ABNORMAL HIGH (ref 24–336)

## 2016-01-08 LAB — SEDIMENTATION RATE: Sed Rate: 127 mm/hr — ABNORMAL HIGH (ref 0–20)

## 2016-01-08 LAB — GLUCOSE, CAPILLARY: Glucose-Capillary: 209 mg/dL — ABNORMAL HIGH (ref 65–99)

## 2016-01-08 MED ORDER — IOPAMIDOL (ISOVUE-300) INJECTION 61%: 100.0000 mL | Freq: Once | INTRAVENOUS | Status: DC | PRN

## 2016-01-08 MED ORDER — ACETAMINOPHEN 325 MG PO TABS: 650.0000 mg | ORAL_TABLET | Freq: Four times a day (QID) | ORAL | Status: DC | PRN

## 2016-01-08 MED ORDER — ACETAMINOPHEN 650 MG RE SUPP: 650.0000 mg | Freq: Four times a day (QID) | RECTAL | Status: DC | PRN

## 2016-01-08 MED ORDER — OXYCODONE-ACETAMINOPHEN 5-325 MG PO TABS: 1.0000 | ORAL_TABLET | Freq: Four times a day (QID) | ORAL | Status: DC | PRN

## 2016-01-08 MED ORDER — POTASSIUM CHLORIDE IN NACL 20-0.9 MEQ/L-% IV SOLN
INTRAVENOUS | Status: DC
  Administered 2016-01-08 – 2016-01-10 (×4): via INTRAVENOUS
  Filled 2016-01-08 (×5): qty 1000

## 2016-01-08 MED ORDER — LIDOCAINE VISCOUS 2 % MT SOLN
15.0000 mL | Freq: Four times a day (QID) | OROMUCOSAL | Status: DC | PRN
  Administered 2016-01-08 – 2016-01-09 (×3): 15 mL via OROMUCOSAL
  Filled 2016-01-08 (×4): qty 15

## 2016-01-08 MED ORDER — INSULIN ASPART 100 UNIT/ML ~~LOC~~ SOLN
0.0000 [IU] | Freq: Three times a day (TID) | SUBCUTANEOUS | Status: DC
  Administered 2016-01-08: 2 [IU] via SUBCUTANEOUS
  Administered 2016-01-10: 9 [IU] via SUBCUTANEOUS
  Filled 2016-01-08: qty 3
  Filled 2016-01-08: qty 2
  Filled 2016-01-08: qty 9

## 2016-01-08 MED ORDER — MAGIC MOUTHWASH
5.0000 mL | Freq: Four times a day (QID) | ORAL | Status: DC | PRN
  Administered 2016-01-08: 5 mL via ORAL
  Filled 2016-01-08 (×2): qty 10

## 2016-01-08 MED ORDER — ORAL CARE MOUTH RINSE
15.0000 mL | Freq: Two times a day (BID) | OROMUCOSAL | Status: DC
  Administered 2016-01-08 – 2016-01-10 (×3): 15 mL via OROMUCOSAL

## 2016-01-08 MED ORDER — POTASSIUM CHLORIDE 20 MEQ PO PACK
40.0000 meq | PACK | Freq: Two times a day (BID) | ORAL | Status: DC
  Filled 2016-01-08: qty 2

## 2016-01-08 MED ORDER — MAGNESIUM OXIDE 400 (241.3 MG) MG PO TABS
400.0000 mg | ORAL_TABLET | ORAL | Status: DC
  Administered 2016-01-08 – 2016-01-09 (×2): 400 mg via ORAL
  Filled 2016-01-08 (×3): qty 1

## 2016-01-08 MED ORDER — INSULIN ASPART 100 UNIT/ML ~~LOC~~ SOLN
0.0000 [IU] | Freq: Every day | SUBCUTANEOUS | Status: DC
  Administered 2016-01-08: 2 [IU] via SUBCUTANEOUS
  Filled 2016-01-08: qty 2

## 2016-01-08 MED ORDER — LORAZEPAM 0.5 MG PO TABS: 0.5000 mg | ORAL_TABLET | Freq: Four times a day (QID) | ORAL | Status: DC | PRN

## 2016-01-08 MED ORDER — VANCOMYCIN HCL IN DEXTROSE 1-5 GM/200ML-% IV SOLN
1000.0000 mg | Freq: Once | INTRAVENOUS | Status: AC
  Administered 2016-01-08: 1000 mg via INTRAVENOUS
  Filled 2016-01-08: qty 200

## 2016-01-08 MED ORDER — SODIUM CHLORIDE 0.9% FLUSH
3.0000 mL | Freq: Two times a day (BID) | INTRAVENOUS | Status: DC
  Administered 2016-01-08 – 2016-01-09 (×3): 3 mL via INTRAVENOUS

## 2016-01-08 MED ORDER — VANCOMYCIN HCL 10 G IV SOLR
1250.0000 mg | Freq: Two times a day (BID) | INTRAVENOUS | Status: DC
  Administered 2016-01-08: 1250 mg via INTRAVENOUS
  Filled 2016-01-08 (×3): qty 1250

## 2016-01-08 MED ORDER — POTASSIUM CHLORIDE CRYS ER 20 MEQ PO TBCR
40.0000 meq | EXTENDED_RELEASE_TABLET | Freq: Two times a day (BID) | ORAL | Status: DC
  Administered 2016-01-08 – 2016-01-10 (×5): 40 meq via ORAL
  Filled 2016-01-08 (×5): qty 2

## 2016-01-08 MED ORDER — SUCRALFATE 1 G PO TABS: 1.0000 g | ORAL_TABLET | Freq: Three times a day (TID) | ORAL | Status: DC

## 2016-01-08 MED ORDER — PIPERACILLIN-TAZOBACTAM 3.375 G IVPB
3.3750 g | Freq: Three times a day (TID) | INTRAVENOUS | Status: DC
  Administered 2016-01-08 – 2016-01-10 (×6): 3.375 g via INTRAVENOUS
  Filled 2016-01-08 (×6): qty 50

## 2016-01-08 MED ORDER — PIPERACILLIN-TAZOBACTAM 3.375 G IVPB 30 MIN
3.3750 g | Freq: Once | INTRAVENOUS | Status: AC
  Administered 2016-01-08: 3.375 g via INTRAVENOUS
  Filled 2016-01-08: qty 50

## 2016-01-08 MED ORDER — SODIUM CHLORIDE 0.9% FLUSH
10.0000 mL | Freq: Two times a day (BID) | INTRAVENOUS | Status: DC
  Administered 2016-01-08: 10 mL

## 2016-01-08 MED ORDER — BOOST / RESOURCE BREEZE PO LIQD
1.0000 | Freq: Three times a day (TID) | ORAL | Status: DC
  Administered 2016-01-08: 1 via ORAL

## 2016-01-08 MED ORDER — MAGNESIUM SULFATE 4 GM/100ML IV SOLN
4.0000 g | Freq: Once | INTRAVENOUS | Status: AC
  Administered 2016-01-08: 4 g via INTRAVENOUS
  Filled 2016-01-08: qty 100

## 2016-01-08 MED ORDER — ONDANSETRON HCL 4 MG PO TABS: 8.0000 mg | ORAL_TABLET | Freq: Two times a day (BID) | ORAL | Status: DC | PRN

## 2016-01-08 MED ORDER — PROCHLORPERAZINE MALEATE 10 MG PO TABS: 10.0000 mg | ORAL_TABLET | Freq: Four times a day (QID) | ORAL | Status: DC | PRN

## 2016-01-08 NOTE — Progress Notes (Signed)
Pharmacy Antibiotic Note  Kent [name] is a 60 y.o. male admitted on 01/08/2016 with sepsis. Pharmacy has been consulted for vancomycin and piperacillin/tazobactam dosing.  Plan: Piperacillin/tazobactam 3.375 g IV q8h EI  Vancomycin 1000 mg dose given in ED. Will follow with vancomycin 1250 mg IV q12h (6 hour stacked dosing) Vancomycin trough goal 15-20 mcg/mL Vancomycin trough ordered for 11/9 @ 0900 which is prior to the 5th dose and should represent steady state  Kinetics: Using actual weight = 75 mg Ke: 0.080 Half-life: 8.6 hours Vd: 52 L Cmin ~17 mcg/mL calculated  Height: '5\' 7"'$  (170.2 cm) Weight: 165 lb 6.4 oz (75 kg) IBW/kg (Calculated) : 66.1  Temp (24hrs), Avg:100.5 F (38.1 C), Min:100 F (37.8 C), Max:100.9 F (38.3 C)   Recent Labs Lab 01/02/16 0850 01/03/16 0856 01/04/16 0830 01/07/16 1322 01/08/16 0945  WBC  --   --   --   --  19.2*  CREATININE 0.80 0.65 0.57* 0.65* 0.67    Estimated Creatinine Clearance: 90.7 mL/min (by C-G formula based on SCr of 0.67 mg/dL).    No Known Allergies  Antimicrobials this admission: vancomycin 11/7 >>  Piperacillin/tazobactam 11/7 >>   Dose adjustments this admission:  Microbiology results: 11/7 BCx: Sent  Thank you for allowing pharmacy to be a part of this patient's care.  Lenis Noon, PharmD, BCPS Clinical Pharmacist 01/08/2016 3:28 PM

## 2016-01-08 NOTE — Progress Notes (Signed)
Patient was not in his room when I went to perform the consult.  I will see the patient tomorrow full consult to follow  Of note Dr. Lucky Cowboy did perform angiography recently which demonstrated the patient has in-line flow down to the foot. No interventions or surgery was indicated at that time.

## 2016-01-08 NOTE — Consult Note (Signed)
ORTHOPAEDIC CONSULTATION  REQUESTING PHYSICIAN: Loletha Grayer, MD  Chief Complaint:   Fever and chills of unknown etiology with induration and erythema and upper anterior chest wall region.  History of Present Illness: Kent [name] is a 60 y.o. 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 He has been hospitalized multiple times over the past month or so due to dehydration as he has extremely poor oral intake. At his last hospitalization, he had a PICC line placed in his left upper extremity so as to receive IV fluids. Since his discharge last week, he has developed fevers and lethargy prompting him to be brought back to the emergency room today and subsequently admitted for further medical intervention and workup. His examination was notable for some pain, induration, and erythema in the upper left anterior chest wall region, raising a concern for cellulitis with possible abscess formation. He has been started on IV vancomycin and an orthopedic consult has been requested. The patient denies any specific injury to the area. She denies any numbness or paresthesias down his arm to his hand.  Past Medical History:  Diagnosis Date  . Arthritis   . Diabetes mellitus without complication ([place])   . Hyperlipidemia   . Hypertension   . Obesity   . Stomach cancer ([place])    had tumor removed- [facility]  . Tongue cancer ([place])    Stage IV squamous cell carcinoma of the base of the tongue/left tonsil   Past Surgical History:  Procedure Laterality Date  . gastric tumor removed  2010  . PERIPHERAL VASCULAR CATHETERIZATION Right 12/31/2015   Procedure: Lower Extremity Angiography;  Surgeon: Algernon Huxley, MD;  Location: [facility];  Service: Cardiovascular;  Laterality: Right;  . tongue mass biopsy      Social History   Social History  . Marital status: Married    Spouse name: N/A  . Number of children: N/A  . Years of education: N/A   Social History Main Topics  . Smoking status: Never Smoker  . Smokeless tobacco: Never Used  . Alcohol use No  . Drug use: No  . Sexual activity: Not Asked   Other Topics Concern  . None   Social History Narrative   Lives at home with wife. Ambulates independently.   Family History  Problem Relation Age of Onset  . Heart disease Mother   . Hypertension Father   . Cancer Sister     stomach  . Heart disease Brother     MI  . Cancer Daughter   . Cancer Son     testicular  . ADD / ADHD Sister    No Known Allergies Prior to Admission medications   Medication Sig Start Date End Date Taking? Authorizing Provider  aspirin 81 MG tablet Take 1 tablet (81 mg total) by mouth daily. 12/31/15   Srikar Sudini, MD  dextromethorphan-guaiFENesin (MUCINEX DM) 30-600 MG 12hr tablet Take 1 tablet by mouth 2 (two) times daily.    Historical Provider, MD  feeding supplement (BOOST / RESOURCE BREEZE) LIQD Take 1 Container by mouth 3 (three) times daily between meals. 12/12/15   Gladstone Lighter, MD  lidocaine (XYLOCAINE) 2 % solution Use as directed 15 mLs in the mouth or throat every 6 (six) hours as needed for mouth pain. 01/01/16   Lequita Asal, MD  LORazepam (ATIVAN) 0.5 MG tablet Take 1 tablet (0.5 mg total)  by mouth every 6 (six) hours as needed (Nausea or vomiting). 10/22/15   Lequita Asal, MD  magic mouthwash SOLN Take 5 mLs by mouth 4 (four) times daily as needed for mouth pain. 12/03/15   Lequita Asal, MD  magnesium oxide (MAG-OX) 400 (241.3 Mg) MG tablet Take 1 tablet (400 mg total) by mouth daily. 12/12/15   Gladstone Lighter, MD  meloxicam (MOBIC) 15 MG tablet Take 1 tablet (15 mg total) by mouth daily. 05/07/15   Charline Bills Cuthriell, PA-C  menthol-cetylpyridinium (CEPACOL) 3 MG lozenge Take 1 lozenge (3 mg total) by mouth as  needed for sore throat. 11/16/15   Theodoro Grist, MD  ondansetron (ZOFRAN) 8 MG tablet Take 1 tablet (8 mg total) by mouth 2 (two) times daily as needed. Start on the third day after chemotherapy. 10/22/15   Lequita Asal, MD  oxyCODONE-acetaminophen (PERCOCET/ROXICET) 5-325 MG tablet Take 1 tablet by mouth every 6 (six) hours as needed for severe pain. 12/31/15   Srikar Sudini, MD  potassium chloride (KLOR-CON) 20 MEQ packet Take 40 mEq by mouth 2 (two) times daily. 12/21/15   Creola Corn, MD  prochlorperazine (COMPAZINE) 10 MG tablet Take 1 tablet (10 mg total) by mouth every 6 (six) hours as needed (Nausea or vomiting). 11/20/15   Lequita Asal, MD  sucralfate (CARAFATE) 1 g tablet Take 1 tablet (1 g total) by mouth 3 (three) times daily. Dissolve in 2 to 3 tbs warm water, swish &swallow 11/01/15   Noreene Filbert, MD   No results found.  Positive ROS: All other systems have been reviewed and were otherwise negative with the exception of those mentioned in the HPI and as above.  Physical Exam: General:  Alert, no acute distress Psychiatric:  Patient is competent for consent with normal mood and affect   Cardiovascular:  No pedal edema Respiratory:  No wheezing, non-labored breathing GI:  Abdomen is soft and non-tender Skin:  No lesions in the area of chief complaint Neurologic:  Sensation intact distally Lymphatic:  No axillary or cervical lymphadenopathy  Orthopedic Exam:  Orthopedic examination is limited to the left chest wall, shoulder, and upper extremity. Skin inspection notable for an area of induration and erythema over the anterior upper chest wall region, primarily over the clavicle. There is mild to moderate tenderness to firm palpation over this area, but no clearly loculated fluid collection can be palpated. There is no swelling or erythema, and no tenderness over the anterior, lateral, or posterior aspects of the shoulder, and no evidence of shoulder effusion. He is  able to actively abduct his shoulder to 80 and forward flex to 90. Passively, he is able to be forward flexed to 110 and abducted to 95 with only mild discomfort. At 90 of abduction, he can be externally rotated to 80 and internally rotated to 60. He exhibits 4/5 strength with resisted internal and external rotation, as well as with resisted abduction with his arm at his side. He is neurovascularly intact to the left upper extremity and hand.  X-rays:  A chest x-ray is available for review. This film demonstrates no obvious bony abnormalities of the left shoulder or clavicular region. A CT scan of the area is pending.  Assessment: Cellulitis of upper left chest wall/pre-clavicular region with possible abscess formation.  Plan: The treatment options are reviewed with the patient. At this point, I feel that the patient will be best managed with IV antibiotics and close observation. If the CT scan does show  a fluid collection in the pre-clavicular region, then the patient may be a candidate for a percutaneous drainage of the abscess with a pigtail catheter placed by interventional radiology. The patient has multiple medical issues and would need to be optimized medically prior to any surgical procedure if such a procedure becomes necessary.  Thank you for ask me to participate in the care of this most unfortunate man. I will be happy to follow him with you.   Pascal Lux, MD  Beeper #:  [phone]  01/08/2016 3:23 PM

## 2016-01-08 NOTE — Progress Notes (Signed)
Patient could not tolerate potassium packet drink, burned his throat. At home he uses tablets he can dissolve. Dr. Leslye Peer notified - instructed to change order to tablets.

## 2016-01-08 NOTE — Progress Notes (Signed)
[facility] day:  01/08/16  Chief Complaint: Kent [name] is a 60 y.o. 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  HPI:  The patient was last seen by me in the medical oncology clinic on 01/01/2016.  At that time, his oral intake remained poor.  Exam revealed improving left neck adenopathy and mucositis post radiation.  He had dry gangrene of the right second toe.  He had hyponatremia (125) and hypomagnesemia (1.2).  He received 1 liter of fluids with 3 gm of magnesium.  He was scheduled for daily labs + fluids.  He was encouraged to eat and drink.  Request was made for a daily liquid and food diary.  He was seen by Dr. Sherrine Maples on 01/04/2016 prior to the weekend.  Sodium was 126, potassium 3.9, and magnesium 1.5.  He received fluids in clinic.  He missed his appointment yesterday.  Symptomatically, he feels weak and tired. He notes that for two days he can eat "okay" then 2 days he can't.  He denies any oral pain.  He denies any nausea or vomiting.    He has had 2 falls in the recent past (one in the backyard and one in the tub).  He denies hitting his shoulder. He has felt a little dizzy. He notes that his left shoulder is sore and warm. He is unclear of when he began to have shoulder discomfort within the past week.   Past Medical History:  Diagnosis Date  . Arthritis   . Diabetes mellitus without complication ([place])   . Hyperlipidemia   . Hypertension   . Obesity   . 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 Tongue cancer ([place])    Stage IV squamous cell carcinoma of the base of the tongue/left tonsil    Past Surgical History:  Procedure Laterality Date  . gastric tumor removed  2010  . PERIPHERAL VASCULAR CATHETERIZATION Right 12/31/2015   Procedure: Lower Extremity Angiography;  Surgeon: Algernon Huxley, MD;   Location: [facility];  Service: Cardiovascular;  Laterality: Right;  . tongue mass biopsy      Family History  Problem Relation Age of Onset  . Heart disease Mother   . Hypertension Father   . Cancer Sister     stomach  . Heart disease Brother     MI  . Cancer Daughter   . Cancer Son     testicular  . ADD / ADHD Sister     Social History:  reports that he has never smoked. He has never used smokeless tobacco. He reports that he does not drink alcohol or use drugs.  He smokes a pipe and cigar rarely (once every 3 months).  He has a son and daughter.  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  He is accompanied by his wife today.  Allergies: No Known Allergies  Current Medications: Current Outpatient Prescriptions  Medication Sig Dispense Refill  . aspirin 81 MG tablet Take 1 tablet (81 mg total) by mouth daily. 30 tablet 0  . dextromethorphan-guaiFENesin (MUCINEX DM) 30-600 MG 12hr tablet Take 1 tablet by mouth 2 (two) times daily.    . feeding supplement (BOOST / RESOURCE BREEZE) LIQD Take 1 Container by mouth 3 (three) times daily between meals. 20000 mL  2  . lidocaine (XYLOCAINE) 2 % solution Use as directed 15 mLs in the mouth or throat every 6 (six) hours as needed for mouth pain. 100 mL 0  . LORazepam (ATIVAN) 0.5 MG tablet Take 1 tablet (0.5 mg total) by mouth every 6 (six) hours as needed (Nausea or vomiting). 30 tablet 0  . magic mouthwash SOLN Take 5 mLs by mouth 4 (four) times daily as needed for mouth pain. 480 mL 1  . magnesium oxide (MAG-OX) 400 (241.3 Mg) MG tablet Take 1 tablet (400 mg total) by mouth daily. 60 tablet 2  . meloxicam (MOBIC) 15 MG tablet Take 1 tablet (15 mg total) by mouth daily. 30 tablet 0  . menthol-cetylpyridinium (CEPACOL) 3 MG lozenge Take 1 lozenge (3 mg total) by mouth as needed for sore throat. 100 tablet 12  . ondansetron (ZOFRAN) 8 MG tablet Take 1 tablet (8 mg total) by mouth 2 (two) times daily as  needed. Start on the third day after chemotherapy. 30 tablet 1  . oxyCODONE-acetaminophen (PERCOCET/ROXICET) 5-325 MG tablet Take 1 tablet by mouth every 6 (six) hours as needed for severe pain. 15 tablet 0  . potassium chloride (KLOR-CON) 20 MEQ packet Take 40 mEq by mouth 2 (two) times daily. 60 packet 0  . prochlorperazine (COMPAZINE) 10 MG tablet Take 1 tablet (10 mg total) by mouth every 6 (six) hours as needed (Nausea or vomiting). 30 tablet 1  . sucralfate (CARAFATE) 1 g tablet Take 1 tablet (1 g total) by mouth 3 (three) times daily. Dissolve in 2 to 3 tbs warm water, swish &swallow 90 tablet 3   No current facility-administered medications for this visit.     Review of Systems:  GENERAL:  Weak and tired.  Fever in clinic.  Weight down 20 pounds since 12/04/2015. PERFORMANCE STATUS (ECOG):  1-2 HEENT:  Thick oral secretions.  No pain with swallowing.  No runny nose or mouth sores. Lungs:  No shortness of breath or cough.  No hemoptysis. Cardiac:  No chest pain, palpitations, orthopnea, or PND.  GI:  Poor oral intake (varies from day to day).  No nausea, vomiting, diarrhea, constipation, melena or hematochezia.  GU:  No urgency, frequency, dysuria, or hematuria.  Musculoskeletal:  Left shoulder "sore".  No muscle tenderness. Extremities:  Right second toe gangrene, worse per wife.  No pain. Skin:  No rashes or skin changes. Neuro:  No headache, numbness or weakness, balance or coordination issues. Endocrine:  Diabetes.  No thyroid issues, hot flashes or night sweats. Psych:  No mood changes, depression or anxiety. Pain:  No throat pain.  Left shoulder sore, Review of systems:  All other systems reviewed and found to be negative.  Physical Exam: Blood pressure 111/78, pulse (!) 125, temperature (!) 100.9 F (38.3 C), temperature source Tympanic, resp. rate 18, weight 174 lb 3 oz (79 kg). GENERAL:  Chronically ill appearing gentleman sitting comfortably in the exam room in no acute  distress. MENTAL STATUS:  Alert and oriented to person, place and time. HEAD:  Slightly disheveled.  Short gray hair.  Goatee.  Normocephalic, atraumatic, face symmetric, no Cushingoid features. EYES:  Blue eyes.  Pupils equal round and reactive to light and accomodation.  No conjunctivitis or scleral icterus. ENT:  Oropharynx normal except for thin yellow secretions in posterior pharynx (dramtically improved).  No thrush.  Tongue normal.  NECK:  Hyperpigmentation left neck with decreased adenopathy s/p radiation.   RESPIRATORY:  Clear to auscultation without rales, wheezes or  rhonchi. CARDIOVASCULAR:  Tachycardia.  Irregularly irregular.  No murmur, rub or gallop. ABDOMEN:  Soft, non-tender, with active bowel sounds, and no hepatosplenomegaly.  No masses. SKIN:  No rashes, ulcers or lesions. EXTREMITIES:  Upper extremity PICC line.  Unremarkable exit site.  No tenderness or erythema.  Tip of right second toe black with slight extension (dry) with base of erythema.  Left shoulder with decreased range of motion.  Marked erythema, induration and warmth.   Dusky gray oval area on left upper chest medial to shoulder. LYMPH NODES: Soft left neck adenopathy (2 cm).  No palpable supraclavicular, axillary or inguinal adenopathy  NEUROLOGICAL: Unremarkable. PSYCH:  Appropriate.     Appointment on 01/08/2016  Component Date Value Ref Range Status  . WBC 01/08/2016 19.2* 3.8 - 10.6 K/uL Final  . RBC 01/08/2016 2.93* 4.40 - 5.90 MIL/uL Final  . Hemoglobin 01/08/2016 8.4* 13.0 - 18.0 g/dL Final  . HCT 01/08/2016 24.5* 40.0 - 52.0 % Final  . MCV 01/08/2016 83.7  80.0 - 100.0 fL Final  . MCH 01/08/2016 28.8  26.0 - 34.0 pg Final  . MCHC 01/08/2016 34.4  32.0 - 36.0 g/dL Final  . RDW 01/08/2016 17.2* 11.5 - 14.5 % Final  . Platelets 01/08/2016 217  150 - 440 K/uL Final  . Neutrophils Relative % 01/08/2016 90  % Final  . Neutro Abs 01/08/2016 17.5* 1.4 - 6.5 K/uL Final  . Lymphocytes Relative 01/08/2016  2  % Final  . Lymphs Abs 01/08/2016 0.3* 1.0 - 3.6 K/uL Final  . Monocytes Relative 01/08/2016 7  % Final  . Monocytes Absolute 01/08/2016 1.3* 0.2 - 1.0 K/uL Final  . Eosinophils Relative 01/08/2016 0  % Final  . Eosinophils Absolute 01/08/2016 0.0  0 - 0.7 K/uL Final  . Basophils Relative 01/08/2016 1  % Final  . Basophils Absolute 01/08/2016 0.1  0 - 0.1 K/uL Final  . Sodium 01/08/2016 125* 135 - 145 mmol/L Final  . Potassium 01/08/2016 2.9* 3.5 - 5.1 mmol/L Final  . Chloride 01/08/2016 90* 101 - 111 mmol/L Final  . CO2 01/08/2016 25  22 - 32 mmol/L Final  . Glucose, Bld 01/08/2016 145* 65 - 99 mg/dL Final  . BUN 01/08/2016 18  6 - 20 mg/dL Final  . Creatinine, Ser 01/08/2016 0.67  0.61 - 1.24 mg/dL Final  . Calcium 01/08/2016 8.0* 8.9 - 10.3 mg/dL Final  . GFR calc non Af Amer 01/08/2016 >60  >60 mL/min Final  . GFR calc Af Amer 01/08/2016 >60  >60 mL/min Final   Comment: (NOTE) The eGFR has been calculated using the CKD EPI equation. This calculation has not been validated in all clinical situations. eGFR's persistently <60 mL/min signify possible Chronic Kidney Disease.   . Anion gap 01/08/2016 10  5 - 15 Final  . Magnesium 01/08/2016 1.4* 1.7 - 2.4 mg/dL Final  Lab on 01/07/2016  Component Date Value Ref Range Status  . Hemoglobin A1C 01/07/2016 6.5%   Final  Office Visit on 01/07/2016  Component Date Value Ref Range Status  . Bayer DCA Hb A1c Waived 01/07/2016 6.5  <7.0 % Final   Comment:                                       Diabetic Adult            <7.0  Healthy Adult        4.3 - 5.7                                                           (DCCT/NGSP) American Diabetes Association's Summary of Glycemic Recommendations for Adults with Diabetes: Hemoglobin A1c <7.0%. More stringent glycemic goals (A1c <6.0%) may further reduce complications at the cost of increased risk of hypoglycemia.   . Glu Hemocue Waived 01/07/2016 203* 65 -  99 mg/dL Final  . Glucose 01/08/2016 156* 65 - 99 mg/dL Final  . BUN 01/08/2016 12  8 - 27 mg/dL Final  . Creatinine, Ser 01/08/2016 0.65* 0.76 - 1.27 mg/dL Final  . GFR calc non Af Amer 01/08/2016 105  >59 mL/min/1.73 Final  . GFR calc Af Amer 01/08/2016 121  >59 mL/min/1.73 Final  . BUN/Creatinine Ratio 01/08/2016 18  10 - 24 Final  . Sodium 01/08/2016 125* 134 - 144 mmol/L Final  . Potassium 01/08/2016 3.3* 3.5 - 5.2 mmol/L Final  . Chloride 01/08/2016 80* 96 - 106 mmol/L Final  . CO2 01/08/2016 24  18 - 29 mmol/L Final  . Calcium 01/08/2016 7.9* 8.6 - 10.2 mg/dL Final  . Total Protein 01/08/2016 5.2* 6.0 - 8.5 g/dL Final  . Albumin 01/08/2016 2.8* 3.6 - 4.8 g/dL Final  . Globulin, Total 01/08/2016 2.4  1.5 - 4.5 g/dL Final  . Albumin/Globulin Ratio 01/08/2016 1.2  1.2 - 2.2 Final  . Bilirubin Total 01/08/2016 1.0  0.0 - 1.2 mg/dL Final  . Alkaline Phosphatase 01/08/2016 244* 39 - 117 IU/L Final  . AST 01/08/2016 77* 0 - 40 IU/L Final  . ALT 01/08/2016 84* 0 - 44 IU/L Final    Assessment:  Kent [name] is a 60 y.o. 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  Tumor was p16 IHC positive (high risk HPV).  CT soft tissue neck on 08/17/2015 revealed a slowly progressive over years, infiltrative, deep 4.1 x 5.4 cm LEFT parotid mass with widespread ipsilateral bulky adenopathy.  PET scan at [facility] on 09/17/2015 revealed left base of tongue malignancy corresponding to asymmetric enhancement identified on the neck CT with hypermetabolic extensive conglomerate left cervical adenopathy (level II-IV) extending from the level of the angle of the mandible down inferiorly to the level of the cricoid cartilage.  There was clustered subcentimeter left level IB submandibular nodes with mild FDG activity.  There was no evidence of metastatic disease.  Alpha gal IgE was 1.21 (< 0.35) c/w IgE antibodies to galactose-alpha-1,3 galactose  (oligosaccharide part on Fab portion of the cetuximab heavy chain) and suggestive of potential cetuximab reaction.  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  Pathologic stage was T4NxM0.  He took imatinib for 1 year.  He is day 35 s/p cycle #3 cisplatin (10/22/2015 - 12/04/2015) with concurrent radiation.  Radiation completed on 12/21/2015.  Left neck adenopathy has dramatically improved.  He has recurrent hyponatremia. He has received daily IVF with electrolyte supplementation every day during clinic.  Yesterday he left early because of his frustration.  He was admitted overnight to [facility] on 11/15/2015 for hyponatremia (sodium 122).  He received  IVF overnight.  He was admitted to [facility] from 12/08/2015 - 12/09/2015 for hydration and electrolyte replacement. Sodium was 126.  He was readmitted to [facility] from 12/09/2015 - 12/12/2015 secondary to nausea, vomiting, and minimal oral intake.  He received IVF with electrolyte replacement and anti-emetics.  A PICC line was placed for continuation of fluids in the outpatient department.  He was admitted to [facility] from 12/28/2015 - 12/31/2015 with dehydration and hyponatremia.  He received IVF.  He was discovered to have dry gangrene of the right second toe.  Angiogram revealed good perfusion. He was to follow-up with Dr. Lucky Cowboy in the outpatient department.  Symptomatically, he has a new tender erythematous and edematous left shoulder worrisome for cellulitis/septic arthritis.  WBC is elevated.  He has dry gangrene of the second right toe which appears slightly worse.  PICC line is unremarkable.  He has tachycardia with an irregular rhythm.  His oral intake remains poor.  He has hyponatremia (125), hypokalemia (3.3) and hypomagnesemia (1.4).    Plan: 1.  Labs today:  CBC with diff, BMP, Mg. 2.  Discuss admission to the hospital for possible sepsis.   Discuss plan for blood cultures, imaging of left shoulder and broad spectrum antibiotics.  Would obtain echocardiogram to rule out endocarditis.  Anticipate ID consult. 3.  Discuss plan to follow-up with vascular surgery to evaluate gangrene of right second toe. 4.  Discuss plan for EKG and admission to telemetry unit. 5.  Discuss plan for ongoing fluids and electrolyte supplementation. 6.  Discussed with hospitalist service.   Lequita Asal, MD  01/08/2016, 11:30 AM

## 2016-01-08 NOTE — Progress Notes (Signed)
Patient had appointment yesterday for lab/MD/IVF's.  Patient did not come for that appt.  RN called yesterday afternoon and spoke with wife who stated patient was not aware of appt.  Rescheduled for today.  Patient arrives in wheelchair.  States he fell yesterday afternoon going into his house, hurting his shoulders.  BP 111/78 HR 125.  Temp 100.9 (tympanic).  Patient states appetite is a little better.  Drinking protein shakes. Marland Kitchen

## 2016-01-08 NOTE — Progress Notes (Signed)
Dr. Leslye Peer notified of SVT.

## 2016-01-08 NOTE — Telephone Encounter (Signed)
I did tell nurse about temo 100.9 and HR had been up and rate 125 in office today.

## 2016-01-08 NOTE — Progress Notes (Addendum)
Direct admit from cancer center. Dr. Leslye Peer aware that patient is in room now, has fever and is tachycardic. Patient does not appear to be in any acute distress. Wife at bedside. Electrolytes abnormal. MD to place orders and round in a little while when able. Lab at bedside to draw blood cultures. MD to order tele, box verified with [place] NT and CCMD. Skin assessment with Serenity, RN. Will continue to monitor.   Pharmacy to send pharm tech to do PTA med rec.

## 2016-01-08 NOTE — Progress Notes (Addendum)
Per [facility], vanc, zosyn, Mg, and KCl are all IV compatible.

## 2016-01-08 NOTE — Telephone Encounter (Signed)
Called the floor and spoke to Serenity to give the report. Tonsil cancer. Finished last dose of cisplatin 12/04/15, last radiation 12/24/15. Pt with thrush in his mouth, irritation to throat from radiation as well as burn marks to skin from radiation.  Pt has picc line in right arm. Upon last admission to hosp. Which was 10/27 to 10/30 it wa found that on righ trfoot 2nd digit he had dry gangrene toe and now today it has progressed up the toe to include entire digit.  He fell at home and hurt his shoulder and back. His left shoulder is red and swollen and feels hot to touch.  hospitalist had been given all above info and went over wbc, k, mag, na level all are abnormal. Pt not taking in enough food or liq- cont. To be dehydrated.

## 2016-01-08 NOTE — H&P (Signed)
[facility] [place] [place] NAME: Kent [name]    MR#:  [id number]  DATE OF BIRTH:  [birth year]  DATE OF ADMISSION:  01/08/2016  PRIMARY CARE PHYSICIAN: Kathrine Haddock, NP   REQUESTING/REFERRING PHYSICIAN: Dr Nolon Stalls  CHIEF COMPLAINT:  Sent in as a direct admission for fever.  HISTORY OF PRESENT ILLNESS:  Kent [name]  is a 60 y.o. male sent into the hospital for fever. Patient states he been feeling really weak. He is not eating or drinking very well. His toe turned black around 2 weeks ago and was seen by Dr. dew but family thinks it's getting worse. His left shoulder is feeling heat on it for the last 1 week and a burning type pain. He is also had decreased range of motion in his left shoulder. For the past 4-5 weeks she's had a PICC line in his right arm secondary to poor IV access and he's been receiving IV fluids on a daily basis at the cancer center. He's been having chills and sweats and weight loss. He's had sore throat secondary to radiation therapy. Patient sent in as a direct admission for sepsis with high white count tachycardia and fever.  PAST MEDICAL HISTORY:   Past Medical History:  Diagnosis Date  . Arthritis   . Diabetes mellitus without complication ([place])   . Hyperlipidemia   . Hypertension   . Obesity   . Stomach cancer ([place])    had tumor removed- [facility]  . 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 gastric tumor removed  2010  . PERIPHERAL VASCULAR CATHETERIZATION Right 12/31/2015   Procedure: Lower Extremity Angiography;  Surgeon: Algernon Huxley, MD;  Location: [facility];  Service: Cardiovascular;  Laterality: Right;  . tongue mass biopsy      SOCIAL HISTORY:   Social History  Substance Use Topics  . Smoking status: Never Smoker  . Smokeless tobacco: Never Used   . Alcohol use No    FAMILY HISTORY:   Family History  Problem Relation Age of Onset  . Heart disease Mother   . Hypertension Father   . Cancer Sister     stomach  . Heart disease Brother     MI  . Cancer Daughter   . Cancer Son     testicular  . ADD / ADHD Sister     DRUG ALLERGIES:  No Known Allergies  REVIEW OF SYSTEMS:  CONSTITUTIONAL: Positive for fever chills and sweats. Positive for weakness. Positive for weight loss. EYES: No blurred or double vision.  EARS, NOSE, AND THROAT: No tinnitus or ear pain. Positive for sore throat and poor appetite. RESPIRATORY: No cough, shortness of breath, wheezing or hemoptysis.  CARDIOVASCULAR: No chest pain, orthopnea, edema.  GASTROINTESTINAL: No nausea, vomiting, diarrhea or abdominal pain. No blood in bowel movements GENITOURINARY: No dysuria, hematuria.  ENDOCRINE: No polyuria, nocturia,  HEMATOLOGY: No anemia, easy bruising or bleeding SKIN: No rash or lesion. MUSCULOSKELETAL: Positive for left shoulder pain   NEUROLOGIC: No tingling, numbness, weakness.  PSYCHIATRY: Some depression.   MEDICATIONS AT HOME:   Prior to Admission medications   Medication Sig Start Date End Date Taking? Authorizing Provider  aspirin 81 MG tablet Take 1 tablet (81 mg total) by mouth daily. 12/31/15   Hillary Bow, MD  dextromethorphan-guaiFENesin [facility] DM)  30-600 MG 12hr tablet Take 1 tablet by mouth 2 (two) times daily.    Historical Provider, MD  feeding supplement (BOOST / RESOURCE BREEZE) LIQD Take 1 Container by mouth 3 (three) times daily between meals. 12/12/15   Gladstone Lighter, MD  lidocaine (XYLOCAINE) 2 % solution Use as directed 15 mLs in the mouth or throat every 6 (six) hours as needed for mouth pain. 01/01/16   Lequita Asal, MD  LORazepam (ATIVAN) 0.5 MG tablet Take 1 tablet (0.5 mg total) by mouth every 6 (six) hours as needed (Nausea or vomiting). 10/22/15   Lequita Asal, MD  magic mouthwash SOLN Take 5 mLs by  mouth 4 (four) times daily as needed for mouth pain. 12/03/15   Lequita Asal, MD  magnesium oxide (MAG-OX) 400 (241.3 Mg) MG tablet Take 1 tablet (400 mg total) by mouth daily. 12/12/15   Gladstone Lighter, MD  meloxicam (MOBIC) 15 MG tablet Take 1 tablet (15 mg total) by mouth daily. 05/07/15   Charline Bills Cuthriell, PA-C  menthol-cetylpyridinium (CEPACOL) 3 MG lozenge Take 1 lozenge (3 mg total) by mouth as needed for sore throat. 11/16/15   Theodoro Grist, MD  ondansetron (ZOFRAN) 8 MG tablet Take 1 tablet (8 mg total) by mouth 2 (two) times daily as needed. Start on the third day after chemotherapy. 10/22/15   Lequita Asal, MD  oxyCODONE-acetaminophen (PERCOCET/ROXICET) 5-325 MG tablet Take 1 tablet by mouth every 6 (six) hours as needed for severe pain. 12/31/15   Srikar Sudini, MD  potassium chloride (KLOR-CON) 20 MEQ packet Take 40 mEq by mouth 2 (two) times daily. 12/21/15   Creola Corn, MD  prochlorperazine (COMPAZINE) 10 MG tablet Take 1 tablet (10 mg total) by mouth every 6 (six) hours as needed (Nausea or vomiting). 11/20/15   Lequita Asal, MD  sucralfate (CARAFATE) 1 g tablet Take 1 tablet (1 g total) by mouth 3 (three) times daily. Dissolve in 2 to 3 tbs warm water, swish &swallow 11/01/15   Noreene Filbert, MD      VITAL SIGNS:  Temperature 100.9. Blood pressure 111/78. Heart rate 137. Respirations 18  PHYSICAL EXAMINATION:  GENERAL:  60 y.o.-year-old patient lying in the bed with no acute distress.  EYES: Pupils equal, round, reactive to light and accommodation. No scleral icterus. Extraocular muscles intact.  HEENT: Head atraumatic, normocephalic. Oropharynx and nasopharynx clear.  NECK:  Supple, no jugular venous distention. No thyroid enlargement, no tenderness.  LUNGS: Normal breath sounds bilaterally, no wheezing, rales,rhonchi or crepitation. No use of accessory muscles of respiration.  CARDIOVASCULAR: S1, S2 Tachycardia. No murmurs, rubs, or gallops.  Difficult to palpate dorsalis pedis pulses. ABDOMEN: Soft, nontender, nondistended. Bowel sounds present. No organomegaly or mass.  EXTREMITIES: No pedal edema, cyanosis, or clubbing. Left shoulder pain full range of motion and decreased range of motion. Anteriorly area of warmth and fullness. Right foot second toe gangrene with erythema at the base. NEUROLOGIC: Cranial nerves II through XII are intact. Muscle strength 5/5 in all extremities. Sensation intact. Gait not checked.  PSYCHIATRIC: The patient is alert and oriented x 3.  SKIN: Gangrene right second toe with erythema at the base of the gangrene. Erythema and fullness left anterior shoulder.  LABORATORY PANEL:   CBC  Recent Labs Lab 01/08/16 0945  WBC 19.2*  HGB 8.4*  HCT 24.5*  PLT 217   ------------------------------------------------------------------------------------------------------------------  Chemistries   Recent Labs Lab 01/07/16 1322 01/08/16 0945  NA 125* 125*  K 3.3* 2.9*  CL 80* 90*  CO2 24 25  GLUCOSE 156* 145*  BUN 12 18  CREATININE 0.65* 0.67  CALCIUM 7.9* 8.0*  MG  --  1.4*  AST 77*  --   ALT 84*  --   ALKPHOS 244*  --   BILITOT 1.0  --    ------------------------------------------------------------------------------------------------------------------    EKG:   Ordered by me  IMPRESSION AND PLAN:   1. Clinical sepsis with fever, tachycardia and leukocytosis. Blood cultures 2 ordered by me. Source of the sepsis could be either a long-standing PICC line and/or left shoulder abscess and/or right second toe gangrene. I will obtain CT scan with contrast of the left shoulder. Case discussed with Dr. Roland Rack orthopedic surgery to evaluate after the CT scan is done. Case discussed with Dr. Ola Spurr infectious disease specialist to see the patient.  I will also consult vascular surgery. Empiric vancomycin and Zosyn with pharmacy dosing. 2. Hyponatremia, hypokalemia, hypomagnesemia. Replace 4 g  magnesium IV 1 and oral magnesium. Replace oral potassium and potassium in IV fluids. Check electrolytes again tomorrow morning. 3. Anemia of chronic disease. Send iron studies and guaiac stools 4. Essential hypertension on no blood pressure medication at this time 5. Type 2 diabetes mellitus not on any diabetic medication at this time 6. 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 Follows with Dr. Nolon Stalls as outpatient.  All the records are reviewed and case discussed with ED provider. Management plans discussed with the patient, family and they are in agreement.  CODE STATUS: DO NOT RESUSCITATE  TOTAL TIME TAKING CARE OF THIS PATIENT: 60 minutes.    Loletha Grayer M.D on 01/08/2016 at 2:16 PM  Between 7am to 6pm - Pager - [phone]  After 6pm call admission pager [phone]  [facility]  [phone]  CC: Primary care physician; Kathrine Haddock, NP

## 2016-01-08 NOTE — Progress Notes (Signed)
CBG 167 per [place] NT. Glucometer not synced yet.

## 2016-01-08 NOTE — Consult Note (Signed)
[facility] Infectious Disease     Reason for Consult: Fever, leukocytosis     Referring Physician: Cordelia Poche Date of Admission:  01/08/2016   Active Problems:   Sepsis ([place])  HPI: Kent [name] is a 60 y.o. 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 He has an elevated wbc to 19.2, Temp 101. He has a picc line in place for IV hydration placed 10/10.  He reports the L shoulder pain began about 2 weeks ago- He thinks he may have bumped it but cannot recall. No recent procedures on shoulder. Did get radiation to neck but not the shoulder area. Last chemo 10/3.   Past Medical History:  Diagnosis Date  . Arthritis   . Diabetes mellitus without complication ([place])   . Hyperlipidemia   . Hypertension   . Obesity   . Stomach cancer ([place])    had tumor removed- [facility]  . 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 gastric tumor removed  2010  . PERIPHERAL VASCULAR CATHETERIZATION Right 12/31/2015   Procedure: Lower Extremity Angiography;  Surgeon: Algernon Huxley, MD;  Location: [facility];  Service: Cardiovascular;  Laterality: Right;  . tongue mass biopsy     Social History  Substance Use Topics  . Smoking status: Never Smoker  . Smokeless tobacco: Never Used  . Alcohol use No   Family History  Problem Relation Age of Onset  . Heart disease Mother   . Hypertension Father   . Cancer Sister     stomach  . Heart disease Brother     MI  . Cancer Daughter   . Cancer Son     testicular  . ADD / ADHD Sister     Allergies: No Known Allergies  Current antibiotics: Antibiotics Given (last 72 hours)    None      MEDICATIONS: . magnesium sulfate 1 - 4 g bolus IVPB  4 g Intravenous Once  . mouth rinse  15 mL Mouth Rinse BID  .  piperacillin-tazobactam  3.375 g Intravenous Once  . potassium chloride  40 mEq Oral BID  . vancomycin  1,000 mg Intravenous Once    Review of Systems - 11 systems reviewed and negative per HPI   OBJECTIVE: Temp:  [100.9 F (38.3 C)] 100.9 F (38.3 C) (11/07 1038) Pulse Rate:  [125] 125 (11/07 1038) Resp:  [18] 18 (11/07 1038) BP: (111)/(78) 111/78 (11/07 1038) Weight:  [79 kg (174 lb 3 oz)] 79 kg (174 lb 3 oz) (11/07 1038) Physical Exam  Constitutional: He is oriented to person, place, and time. Chronically ill appearing HENT: anicteric Mouth/Throat: Oropharynx is clear and dry. No oropharyngeal exudate.  Cardiovascular: tachy reg, 2/6 sm  Pulmonary/Chest: clear Abdominal: Soft. Bowel sounds are normal. He exhibits no distension. There is no tenderness.  Lymphadenopathy: He has no cervical adenopathy.  Neurological: He is alert and oriented to person, place, and time.  L shoulder with marked swelling, warmth  and firm induration, with very limited ROM actively.  Skin: Ant shoulder with what appear to be thin bullae  Psychiatric: He has a normal mood and affect. His behavior is normal.  PICC LUE wnl  LABS: Results for orders placed or performed in visit on 01/08/16 (from the  past 48 hour(s))  CBC with Differential     Status: Abnormal   Collection Time: 01/08/16  9:45 AM  Result Value Ref Range   WBC 19.2 (H) 3.8 - 10.6 K/uL   RBC 2.93 (L) 4.40 - 5.90 MIL/uL   Hemoglobin 8.4 (L) 13.0 - 18.0 g/dL   HCT 24.5 (L) 40.0 - 52.0 %   MCV 83.7 80.0 - 100.0 fL   MCH 28.8 26.0 - 34.0 pg   MCHC 34.4 32.0 - 36.0 g/dL   RDW 17.2 (H) 11.5 - 14.5 %   Platelets 217 150 - 440 K/uL   Neutrophils Relative % 90 %   Neutro Abs 17.5 (H) 1.4 - 6.5 K/uL   Lymphocytes Relative 2 %   Lymphs Abs 0.3 (L) 1.0 - 3.6 K/uL   Monocytes Relative 7 %   Monocytes Absolute 1.3 (H) 0.2 - 1.0 K/uL   Eosinophils Relative 0 %   Eosinophils Absolute 0.0 0 - 0.7 K/uL   Basophils Relative 1 %   Basophils  Absolute 0.1 0 - 0.1 K/uL  Basic metabolic panel     Status: Abnormal   Collection Time: 01/08/16  9:45 AM  Result Value Ref Range   Sodium 125 (L) 135 - 145 mmol/L   Potassium 2.9 (L) 3.5 - 5.1 mmol/L   Chloride 90 (L) 101 - 111 mmol/L   CO2 25 22 - 32 mmol/L   Glucose, Bld 145 (H) 65 - 99 mg/dL   BUN 18 6 - 20 mg/dL   Creatinine, Ser 0.67 0.61 - 1.24 mg/dL   Calcium 8.0 (L) 8.9 - 10.3 mg/dL   GFR calc non Af Amer >60 >60 mL/min   GFR calc Af Amer >60 >60 mL/min    Comment: (NOTE) The eGFR has been calculated using the CKD EPI equation. This calculation has not been validated in all clinical situations. eGFR's persistently <60 mL/min signify possible Chronic Kidney Disease.    Anion gap 10 5 - 15  Magnesium     Status: Abnormal   Collection Time: 01/08/16  9:45 AM  Result Value Ref Range   Magnesium 1.4 (L) 1.7 - 2.4 mg/dL   No components found for: ESR, C REACTIVE PROTEIN MICRO: Recent Results (from the past 720 hour(s))  Culture, blood (Routine X 2) w Reflex to ID Panel     Status: None   Collection Time: 12/10/15  1:39 AM  Result Value Ref Range Status   Specimen Description BLOOD RIGHT ANTECUBITAL  Final   Special Requests BOTTLES DRAWN AEROBIC AND ANAEROBIC 10CC  Final   Culture NO GROWTH 5 DAYS  Final   Report Status 12/15/2015 FINAL  Final  Culture, blood (Routine X 2) w Reflex to ID Panel     Status: None   Collection Time: 12/10/15  1:39 AM  Result Value Ref Range Status   Specimen Description BLOOD LEFT ANTECUBITAL  Final   Special Requests BOTTLES DRAWN AEROBIC AND ANAEROBIC [place]  Final   Culture NO GROWTH 5 DAYS  Final   Report Status 12/15/2015 FINAL  Final    IMAGING: Dg Chest Port 1 View  Result Date: 12/11/2015 CLINICAL DATA:  PICC placement. EXAM: PORTABLE CHEST 1 VIEW COMPARISON:  12/10/2015 FINDINGS: Tip of the right upper extremity PICC in the mid distal SVC. No pneumothorax. Lungs are clear. Unchanged cardiomediastinal contours. No pleural fluid  or focal airspace disease. IMPRESSION: Tip of the right upper extremity PICC in the mid distal SVC. Electronically Signed   By: Threasa Beards  Ehinger M.D.   On: 12/11/2015 21:11   Dg Chest Port 1 View  Result Date: 12/10/2015 CLINICAL DATA:  Salivary gland cancer. Chemotherapy. Vomiting and coughing. EXAM: PORTABLE CHEST 1 VIEW COMPARISON:  Chest radiograph 02/14/2013 FINDINGS: Lungs are well inflated. Cardiomediastinal contours are normal. No pleural effusion or pneumothorax. No focal airspace consolidation or pulmonary edema. IMPRESSION: Clear lungs. Electronically Signed   By: Ulyses Jarred M.D.   On: 12/10/2015 02:15   Nm Outside Films Head/face  Result Date: 12/17/2015 This examination belongs to an outside facility and is stored here for comparison purposes only.  Contact the originating outside institution for any associated report or interpretation.   Assessment:   Kent [name] is a 60 y.o. 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 His wbc is elevated at 19, Temp 101. Most impressive is his L shoulder swelling and induration.  His picc line appears normal. His gangrene is dry and does not appear acutely infected.  Per review of chart he did have significatn mucositis and secretions in posterior pharynx during chemo and radiation but this seems improved some and he is eating currently. Most concerning for a picc line infection and seeding of his L shoulder.  He could also have become bacteremic from mucositis.    Recommendations BCX pending - if + remove PICC Agree with imaging and ortho consult for L shoulder. Cont vanco and zosyn Check Echo Thank you very much for allowing me to participate in the care of this patient. Please call with questions.   Cheral Marker. Ola Spurr, MD

## 2016-01-09 ENCOUNTER — Inpatient Hospital Stay: Payer: Managed Care, Other (non HMO) | Admitting: Anesthesiology

## 2016-01-09 ENCOUNTER — Encounter
Admission: AD | Disposition: A | Discharge status: Other Acute Inpatient Hospital | Payer: Self-pay | Source: Ambulatory Visit | Attending: Internal Medicine

## 2016-01-09 ENCOUNTER — Encounter: Payer: Self-pay | Admitting: General Surgery

## 2016-01-09 ENCOUNTER — Inpatient Hospital Stay (HOSPITAL_COMMUNITY)
Admission: AD | Admit: 2016-01-09 | Discharge: 2016-01-09 | Disposition: A | Discharge status: Home / Self Care | Payer: Managed Care, Other (non HMO) | Source: Ambulatory Visit | Attending: Infectious Diseases | Admitting: Infectious Diseases

## 2016-01-09 ENCOUNTER — Inpatient Hospital Stay
Admission: AD | Admit: 2016-01-09 | Payer: Managed Care, Other (non HMO) | Source: Ambulatory Visit | Admitting: Internal Medicine

## 2016-01-09 DIAGNOSIS — M25512 Pain in left shoulder: Secondary | ICD-10-CM

## 2016-01-09 DIAGNOSIS — R509 Fever, unspecified: Secondary | ICD-10-CM

## 2016-01-09 DIAGNOSIS — M726 Necrotizing fasciitis: Secondary | ICD-10-CM

## 2016-01-09 DIAGNOSIS — I70261 Atherosclerosis of native arteries of extremities with gangrene, right leg: Secondary | ICD-10-CM

## 2016-01-09 DIAGNOSIS — I10 Essential (primary) hypertension: Secondary | ICD-10-CM

## 2016-01-09 HISTORY — PX: I&D EXTREMITY: SHX5045

## 2016-01-09 LAB — BLOOD CULTURE ID PANEL (REFLEXED)
Acinetobacter baumannii: NOT DETECTED
CANDIDA GLABRATA: NOT DETECTED
CANDIDA KRUSEI: NOT DETECTED
CANDIDA TROPICALIS: NOT DETECTED
Candida albicans: NOT DETECTED
Candida parapsilosis: NOT DETECTED
ENTEROBACTER CLOACAE COMPLEX: NOT DETECTED
ENTEROCOCCUS SPECIES: NOT DETECTED
ESCHERICHIA COLI: NOT DETECTED
Enterobacteriaceae species: NOT DETECTED
Haemophilus influenzae: NOT DETECTED
KLEBSIELLA PNEUMONIAE: NOT DETECTED
Klebsiella oxytoca: NOT DETECTED
Listeria monocytogenes: NOT DETECTED
Methicillin resistance: NOT DETECTED
Neisseria meningitidis: NOT DETECTED
PROTEUS SPECIES: NOT DETECTED
Pseudomonas aeruginosa: NOT DETECTED
SERRATIA MARCESCENS: NOT DETECTED
STAPHYLOCOCCUS AUREUS BCID: DETECTED — AB
STAPHYLOCOCCUS SPECIES: DETECTED — AB
Streptococcus agalactiae: NOT DETECTED
Streptococcus pneumoniae: NOT DETECTED
Streptococcus pyogenes: NOT DETECTED
Streptococcus species: NOT DETECTED

## 2016-01-09 LAB — CBC
HEMATOCRIT: 21.4 % — AB (ref 40.0–52.0)
Hemoglobin: 7.4 g/dL — ABNORMAL LOW (ref 13.0–18.0)
MCH: 29.2 pg (ref 26.0–34.0)
MCHC: 34.5 g/dL (ref 32.0–36.0)
MCV: 84.7 fL (ref 80.0–100.0)
PLATELETS: 153 10*3/uL (ref 150–440)
RBC: 2.52 MIL/uL — ABNORMAL LOW (ref 4.40–5.90)
RDW: 17.5 % — AB (ref 11.5–14.5)
WBC: 12.2 10*3/uL — AB (ref 3.8–10.6)

## 2016-01-09 LAB — MAGNESIUM: Magnesium: 1.7 mg/dL (ref 1.7–2.4)

## 2016-01-09 LAB — BASIC METABOLIC PANEL
Anion gap: 10 (ref 5–15)
BUN: 14 mg/dL (ref 6–20)
CHLORIDE: 93 mmol/L — AB (ref 101–111)
CO2: 26 mmol/L (ref 22–32)
CREATININE: 0.59 mg/dL — AB (ref 0.61–1.24)
Calcium: 7.6 mg/dL — ABNORMAL LOW (ref 8.9–10.3)
Glucose, Bld: 117 mg/dL — ABNORMAL HIGH (ref 65–99)
POTASSIUM: 3 mmol/L — AB (ref 3.5–5.1)
SODIUM: 129 mmol/L — AB (ref 135–145)

## 2016-01-09 LAB — GLUCOSE, CAPILLARY
GLUCOSE-CAPILLARY: 115 mg/dL — AB (ref 65–99)
GLUCOSE-CAPILLARY: 94 mg/dL (ref 65–99)
Glucose-Capillary: 116 mg/dL — ABNORMAL HIGH (ref 65–99)
Glucose-Capillary: 226 mg/dL — ABNORMAL HIGH (ref 65–99)

## 2016-01-09 SURGERY — IRRIGATION AND DEBRIDEMENT EXTREMITY
Anesthesia: General | Site: Shoulder | Laterality: Left | Wound class: Dirty or Infected

## 2016-01-09 MED ORDER — LIDOCAINE HCL (CARDIAC) 20 MG/ML IV SOLN
INTRAVENOUS | Status: DC | PRN
  Administered 2016-01-09: 30 mg via INTRAVENOUS

## 2016-01-09 MED ORDER — FENTANYL CITRATE (PF) 100 MCG/2ML IJ SOLN
25.0000 ug | INTRAMUSCULAR | Status: DC | PRN
  Administered 2016-01-09 (×5): 25 ug via INTRAVENOUS

## 2016-01-09 MED ORDER — ONDANSETRON HCL 4 MG/2ML IJ SOLN: 4.0000 mg | Freq: Once | INTRAMUSCULAR | Status: DC | PRN

## 2016-01-09 MED ORDER — CLINDAMYCIN PHOSPHATE 600 MG/50ML IV SOLN
600.0000 mg | Freq: Four times a day (QID) | INTRAVENOUS | Status: DC
  Administered 2016-01-09 – 2016-01-10 (×5): 600 mg via INTRAVENOUS
  Filled 2016-01-09 (×7): qty 50

## 2016-01-09 MED ORDER — MIDAZOLAM HCL 5 MG/5ML IJ SOLN
INTRAMUSCULAR | Status: DC | PRN
  Administered 2016-01-09: 1 mg via INTRAVENOUS

## 2016-01-09 MED ORDER — ROCURONIUM BROMIDE 100 MG/10ML IV SOLN
INTRAVENOUS | Status: DC | PRN
  Administered 2016-01-09: 5 mg via INTRAVENOUS

## 2016-01-09 MED ORDER — PROPOFOL 10 MG/ML IV BOLUS
INTRAVENOUS | Status: DC | PRN
  Administered 2016-01-09: 100 mg via INTRAVENOUS

## 2016-01-09 MED ORDER — FENTANYL CITRATE (PF) 100 MCG/2ML IJ SOLN
INTRAMUSCULAR | Status: DC | PRN
Start: 2016-01-09 — End: 2016-01-09
  Administered 2016-01-09 (×2): 50 ug via INTRAVENOUS

## 2016-01-09 MED ORDER — MORPHINE SULFATE (PF) 4 MG/ML IV SOLN: 2.0000 mg | INTRAVENOUS | Status: DC | PRN

## 2016-01-09 MED ORDER — LIDOCAINE HCL (PF) 1 % IJ SOLN
INTRAMUSCULAR | Status: AC
  Filled 2016-01-09: qty 30

## 2016-01-09 MED ORDER — PHENYLEPHRINE HCL 10 MG/ML IJ SOLN
INTRAMUSCULAR | Status: DC | PRN
  Administered 2016-01-09 (×2): 100 ug via INTRAVENOUS

## 2016-01-09 MED ORDER — ASPIRIN EC 81 MG PO TBEC: 81.0000 mg | DELAYED_RELEASE_TABLET | Freq: Every day | ORAL | Status: DC | PRN

## 2016-01-09 MED ORDER — FENTANYL CITRATE (PF) 100 MCG/2ML IJ SOLN
INTRAMUSCULAR | Status: AC
  Administered 2016-01-09: 25 ug via INTRAVENOUS
  Filled 2016-01-09: qty 2

## 2016-01-09 MED ORDER — SUCCINYLCHOLINE CHLORIDE 20 MG/ML IJ SOLN
INTRAMUSCULAR | Status: DC | PRN
  Administered 2016-01-09: 100 mg via INTRAVENOUS

## 2016-01-09 MED ORDER — SODIUM BICARBONATE 4 % IV SOLN
INTRAVENOUS | Status: AC
  Filled 2016-01-09: qty 5

## 2016-01-09 MED ORDER — ONDANSETRON HCL 4 MG/2ML IJ SOLN
INTRAMUSCULAR | Status: DC | PRN
  Administered 2016-01-09: 4 mg via INTRAVENOUS

## 2016-01-09 MED ORDER — BUPIVACAINE HCL (PF) 0.5 % IJ SOLN
INTRAMUSCULAR | Status: AC
  Filled 2016-01-09: qty 30

## 2016-01-09 MED ORDER — BOOST / RESOURCE BREEZE PO LIQD
1.0000 | Freq: Three times a day (TID) | ORAL | Status: DC
  Administered 2016-01-09: 1 via ORAL
  Filled 2016-01-09: qty 1

## 2016-01-09 MED ORDER — SODIUM CHLORIDE 0.9 % IV SOLN
INTRAVENOUS | Status: DC | PRN
  Administered 2016-01-09: 11:00:00 via INTRAVENOUS

## 2016-01-09 MED ORDER — SUGAMMADEX SODIUM 200 MG/2ML IV SOLN
INTRAVENOUS | Status: DC | PRN
  Administered 2016-01-09: 150 mg via INTRAVENOUS

## 2016-01-09 SURGICAL SUPPLY — 39 items
BANDAGE ELASTIC 6 LF NS (GAUZE/BANDAGES/DRESSINGS) IMPLANT
BLADE SURG 15 STRL SS SAFETY (BLADE) ×3 IMPLANT
BNDG GAUZE 4.5X4.1 6PLY STRL (MISCELLANEOUS) IMPLANT
CANISTER SUCT 1200ML W/VALVE (MISCELLANEOUS) ×3 IMPLANT
CHLORAPREP W/TINT 26ML (MISCELLANEOUS) ×3 IMPLANT
CLOSURE WOUND 1/2 X4 (GAUZE/BANDAGES/DRESSINGS) ×1
DRAPE LAPAROTOMY 100X77 ABD (DRAPES) ×3 IMPLANT
DRESSING TELFA 4X3 1S ST N-ADH (GAUZE/BANDAGES/DRESSINGS) IMPLANT
DRSG EMULSION OIL 3X8 NADH (GAUZE/BANDAGES/DRESSINGS) ×3 IMPLANT
DRSG VAC ATS MED SENSATRAC (GAUZE/BANDAGES/DRESSINGS) ×3 IMPLANT
ELECT CAUTERY BLADE TIP 2.5 (TIP) ×3
ELECT REM PT RETURN 9FT ADLT (ELECTROSURGICAL) ×3
ELECTRODE CAUTERY BLDE TIP 2.5 (TIP) ×1 IMPLANT
ELECTRODE REM PT RTRN 9FT ADLT (ELECTROSURGICAL) ×1 IMPLANT
GLOVE BIO SURGEON STRL SZ 6.5 (GLOVE) ×4 IMPLANT
GLOVE BIO SURGEON STRL SZ7.5 (GLOVE) ×9 IMPLANT
GLOVE BIO SURGEONS STRL SZ 6.5 (GLOVE) ×2
GLOVE INDICATOR 8.0 STRL GRN (GLOVE) ×9 IMPLANT
GOWN STRL REUS W/ TWL LRG LVL3 (GOWN DISPOSABLE) ×3 IMPLANT
GOWN STRL REUS W/TWL LRG LVL3 (GOWN DISPOSABLE) ×6
KIT RM TURNOVER STRD PROC AR (KITS) ×3 IMPLANT
LABEL OR SOLS (LABEL) ×3 IMPLANT
NDL SAFETY 22GX1.5 (NEEDLE) ×3 IMPLANT
NEEDLE HYPO 25X1 1.5 SAFETY (NEEDLE) ×3 IMPLANT
NS IRRIG 1000ML POUR BTL (IV SOLUTION) ×3 IMPLANT
PACK BASIN MINOR ARMC (MISCELLANEOUS) ×3 IMPLANT
SPONGE LAP 18X18 5 PK (GAUZE/BANDAGES/DRESSINGS) ×3 IMPLANT
STRIP CLOSURE SKIN 1/2X4 (GAUZE/BANDAGES/DRESSINGS) ×2 IMPLANT
SUT VIC AB 2-0 CT1 27 (SUTURE)
SUT VIC AB 2-0 CT1 TAPERPNT 27 (SUTURE) IMPLANT
SUT VIC AB 2-0 CT2 27 (SUTURE) IMPLANT
SUT VIC AB 3-0 SH 27 (SUTURE) ×6
SUT VIC AB 3-0 SH 27X BRD (SUTURE) ×3 IMPLANT
SUT VIC AB 4-0 FS2 27 (SUTURE) IMPLANT
SWAB CULTURE AMIES ANAERIB BLU (MISCELLANEOUS) ×3 IMPLANT
SWABSTK COMLB BENZOIN TINCTURE (MISCELLANEOUS) IMPLANT
SYR BULB IRRIG 60ML STRL (SYRINGE) ×3 IMPLANT
SYR CONTROL 10ML (SYRINGE) ×6 IMPLANT
WND VAC CANISTER 500ML (MISCELLANEOUS) ×3 IMPLANT

## 2016-01-09 NOTE — Progress Notes (Signed)
dsg is the wond vac with black foam   Scant drainage

## 2016-01-09 NOTE — Progress Notes (Signed)
[facility] INF[facility]OUS DISE[facility] PROGRESS NOTE Date of Admission:  01/08/2016     ID: Kent [name] is a 60 y.o. male with MSSA bacteremia, Necrotizing L shoulder infection  Active Problems:   Sepsis ([place])   Subjective: Taken to OR this morning for excision of necrotic tissue and wound vac. Currently sitting up and eating.   ROS  Eleven systems are reviewed and negative except per hpi  Medications:  Antibiotics Given (last 72 hours)    Date/Time Action Medication Dose Rate   01/08/16 1524 Given   vancomycin (VANCOCIN) IVPB 1000 mg/200 mL premix 1,000 mg 200 mL/hr   01/08/16 1524 Given   piperacillin-tazobactam (ZOSYN) IVPB 3.375 g 3.375 g 100 mL/hr   01/08/16 2114 Given   piperacillin-tazobactam (ZOSYN) IVPB 3.375 g 3.375 g 12.5 mL/hr   01/08/16 2114 Given   vancomycin (VANCOCIN) 1,250 mg in sodium chloride 0.9 % 250 mL IVPB 1,250 mg 166.7 mL/hr   01/09/16 0511 Given   piperacillin-tazobactam (ZOSYN) IVPB 3.375 g 3.375 g 12.5 mL/hr   01/09/16 1009 Given  [MD request early admin]   clindamycin (CLEOCIN) IVPB 600 mg 600 mg 100 mL/hr   01/09/16 1418 Given   piperacillin-tazobactam (ZOSYN) IVPB 3.375 g 3.375 g 12.5 mL/hr     . clindamycin (CLEOCIN) IV  600 mg Intravenous Q6H  . feeding supplement  1 Container Oral TID BM  . feeding supplement  1 Container Oral TID BM  . insulin aspart  0-5 Units Subcutaneous QHS  . insulin aspart  0-9 Units Subcutaneous TID WC  . magnesium oxide  400 mg Oral Q24H  . mouth rinse  15 mL Mouth Rinse BID  . piperacillin-tazobactam (ZOSYN)  IV  3.375 g Intravenous Q8H  . potassium chloride  40 mEq Oral BID  . sodium chloride flush  10-40 mL Intracatheter Q12H  . sodium chloride flush  3 mL Intravenous Q12H  . vancomycin  1,250 mg Intravenous Q12H    Objective: Vital signs in last 24 hours: Temp:  [97.6 F (36.4 C)-99.8 F (37.7 C)] 97.6 F (36.4 C) (11/08 1403) Pulse Rate:  [92-119] 101 (11/08 1403) Resp:  [12-37] 17 (11/08 1403) BP:  (118-137)/(60-97) 128/76 (11/08 1403) SpO2:  [87 %-100 %] 96 % (11/08 1403) Constitutional: He is oriented to person, place, and time. Chronically ill appearing HENT: anicteric Mouth/Throat: Oropharynx is clear and dry. No oropharyngeal exudate.  Cardiovascular: tachy reg, 2/6 sm  Pulmonary/Chest: clear Abdominal: Soft. Bowel sounds are normal. He exhibits no distension. There is no tenderness.  Lymphadenopathy: He has no cervical adenopathy.  Neurological: He is alert and oriented to person, place, and time.  L shoulder with mod swelling, warmth  - has wound vac over excised skin  Skin:as above  Psychiatric: He has a normal mood and affect. His behavior is normal.  PICC rUE wnl  Lab Results  Recent Labs  01/08/16 0945 01/09/16 0909  WBC 19.2* 12.2*  HGB 8.4* 7.4*  HCT 24.5* 21.4*  NA 125* 129*  K 2.9* 3.0*  CL 90* 93*  CO2 25 26  BUN 18 14  CREATININE 0.67 0.59*    Microbiology: Results for orders placed or performed during the hospital encounter of 01/08/16  CULTURE, BLOOD (ROUTINE X 2) w Reflex to ID Panel     Status: None (Preliminary result)   Collection Time: 01/08/16  1:13 PM  Result Value Ref Range Status   Specimen Description BLOOD LEFT ASSIST CONTROL  Final   Special Requests BOTTLES DRAWN AEROBIC AND  ANAEROBIC 4CCAERO,6CCANA  Final   Culture NO GROWTH < 24 HOURS  Final   Report Status PENDING  Incomplete  CULTURE, BLOOD (ROUTINE X 2) w Reflex to ID Panel     Status: None (Preliminary result)   Collection Time: 01/08/16  1:14 PM  Result Value Ref Range Status   Specimen Description BLOOD RIGHT ASSIST CONTROL  Final   Special Requests BOTTLES DRAWN AEROBIC AND ANAEROBIC [place]  Final   Culture  Setup Time   Final    Organism ID to follow GRAM POSITIVE COCCI ANAEROBIC BOTTLE ONLY CRITICAL RESULT CALLED TO, READ BACK BY AND VERIFIED WITH: CHRISTINE KATSOUDAS 01/09/16 1146 SGD    Culture GRAM POSITIVE COCCI  Final   Report Status PENDING  Incomplete   Blood Culture ID Panel (Reflexed)     Status: Abnormal   Collection Time: 01/08/16  1:14 PM  Result Value Ref Range Status   Enterococcus species NOT DETECTED NOT DETECTED Final   Listeria monocytogenes NOT DETECTED NOT DETECTED Final   Staphylococcus species DETECTED (A) NOT DETECTED Final    Comment: CRITICAL RESULT CALLED TO, READ BACK BY AND VERIFIED WITH: CHRISTINE KATSOUDAS 01/09/16 1146 SGD    Staphylococcus aureus DETECTED (A) NOT DETECTED Final    Comment: CRITICAL RESULT CALLED TO, READ BACK BY AND VERIFIED WITH: CHRISTINE KATSOUDAS 01/09/16 1146 SGD    Methicillin resistance NOT DETECTED NOT DETECTED Final   Streptococcus species NOT DETECTED NOT DETECTED Final   Streptococcus agalactiae NOT DETECTED NOT DETECTED Final   Streptococcus pneumoniae NOT DETECTED NOT DETECTED Final   Streptococcus pyogenes NOT DETECTED NOT DETECTED Final   Acinetobacter baumannii NOT DETECTED NOT DETECTED Final   Enterobacteriaceae species NOT DETECTED NOT DETECTED Final   Enterobacter cloacae complex NOT DETECTED NOT DETECTED Final   Escherichia coli NOT DETECTED NOT DETECTED Final   Klebsiella oxytoca NOT DETECTED NOT DETECTED Final   Klebsiella pneumoniae NOT DETECTED NOT DETECTED Final   Proteus species NOT DETECTED NOT DETECTED Final   Serratia marcescens NOT DETECTED NOT DETECTED Final   Haemophilus influenzae NOT DETECTED NOT DETECTED Final   Neisseria meningitidis NOT DETECTED NOT DETECTED Final   Pseudomonas aeruginosa NOT DETECTED NOT DETECTED Final   Candida albicans NOT DETECTED NOT DETECTED Final   Candida glabrata NOT DETECTED NOT DETECTED Final   Candida krusei NOT DETECTED NOT DETECTED Final   Candida parapsilosis NOT DETECTED NOT DETECTED Final   Candida tropicalis NOT DETECTED NOT DETECTED Final    Studies/Results: Ct Shoulder Left W Contrast  Result Date: 01/08/2016 CLINICAL DATA:  Left shoulder pain and swelling for 1 week. EXAM: CT OF THE LEFT SHOULDER WITH CONTRAST  TECHNIQUE: Multidetector CT imaging was performed following the standard protocol during bolus administration of intravenous contrast. CONTRAST:  100 cc Isovue-300 COMPARISON:  None. FINDINGS: Large collection of fluid and air in the subcutaneous tissues overlying the left shoulder. This appears to be both superficial and also within the deltoid musculature. Some areas also noted in the left supraclavicular fossa. I do not see a discrete drainable soft tissue abscess. No findings to suggest septic arthritis involving the glenohumeral joint or AC joint. No findings for osteomyelitis. The visualized left lung is clear. IMPRESSION: Large collection of air and fluid in the superficial soft tissues around the shoulder. Findings worrisome for gas-forming infection without discrete drainable abscess. Suspect small abscesses. No definite CT findings for septic arthritis or osteomyelitis. Electronically Signed   By: Marijo Sanes M.D.   On: 01/08/2016 19:53  Assessment/Plan: Kent [name] is a 60 y.o. 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 His wbc was elevated at 19, Temp 101. His L shoulder ended up having gas noted on CT and had surgery with Dr Bary Castilla 11/8 for necrotic infection. His BCX + MSSA  His gangrene is dry and does not appear acutely infected.    Recommendations Remove picc- ordered Repeat BCX DC vanco Cont  Zosyn - do not narrow to ancef alone as S aureus does not usually gas gas gangrene so possibilty of mixed synergistic infection Continue clinda for toxin production for 24 hours Check Echo Thank you very much for the consult. Will follow with you.  [place], Jameisha Stofko P   01/09/2016, 2:26 PM

## 2016-01-09 NOTE — Progress Notes (Signed)
Kent [name] at Kent [name]: Kent [name]    MR#:  [id number]  DATE OF BIRTH:  04/04/1955  SUBJECTIVE:  Came in with fever and severe left shoulder pain and swelling with erythema and decreased ROM of the left shoulder Pain for 1 week  REVIEW OF SYSTEMS:   Review of Systems  Constitutional: Positive for fever. Negative for chills and weight loss.  HENT: Negative for ear discharge, ear pain and nosebleeds.   Eyes: Negative for blurred vision, pain and discharge.  Respiratory: Negative for sputum production, shortness of breath, wheezing and stridor.   Cardiovascular: Negative for chest pain, palpitations, orthopnea and PND.  Gastrointestinal: Negative for abdominal pain, diarrhea, nausea and vomiting.  Genitourinary: Negative for frequency and urgency.  Musculoskeletal: Negative for back pain and joint pain.  Neurological: Positive for weakness. Negative for sensory change, speech change and focal weakness.  Psychiatric/Behavioral: Negative for depression and hallucinations. The patient is not nervous/anxious.    Tolerating Diet:npo Tolerating TM:[id number]   DRUG ALLERGIES:  No Known Allergies  VITALS:  Blood pressure 124/80, pulse (!) 105, temperature 99.1 F (37.3 C), temperature source Oral, resp. rate 16, height '5\' 7"'$  (1.702 m), weight 75 kg (165 lb 6.4 oz), SpO2 (!) 87 %.  PHYSICAL EXAMINATION:   Physical Exam  GENERAL:  60 y.o.-year-old patient lying in the bed with no acute distress. Appears weak and ill EYES: Pupils equal, round, reactive to light and accommodation. No scleral icterus. Extraocular muscles intact.  HEENT: Head atraumatic, normocephalic. Oropharynx and nasopharynx clear.  NECK:  Supple, no jugular venous distention. No thyroid enlargement, no tenderness.  LUNGS: Normal breath sounds bilaterally, no wheezing, rales, rhonchi. No use of accessory muscles of respiration.  CARDIOVASCULAR: S1, S2 normal. No  murmurs, rubs, or gallops.  ABDOMEN: Soft, nontender, nondistended. Bowel sounds present. No organomegaly or mass.  EXTREMITIES: No cyanosis, clubbing or edema b/l.   Left shoulder area erythematous and swollen with decreased range of motion. Severe pain ++ NEUROLOGIC: Cranial nerves II through XII are intact. No focal Motor or sensory deficits b/l.   PSYCHIATRIC:  patient is alert and oriented x 3.  SKIN: No obvious rash, lesion, or ulcer.   LABORATORY PANEL:  CBC  Recent Labs Lab 01/08/16 0945  WBC 19.2*  HGB 8.4*  HCT 24.5*  PLT 217    Chemistries   Recent Labs Lab 01/07/16 1322 01/08/16 0945  NA 125* 125*  K 3.3* 2.9*  CL 80* 90*  CO2 24 25  GLUCOSE 156* 145*  BUN 12 18  CREATININE 0.65* 0.67  CALCIUM 7.9* 8.0*  MG  --  1.4*  AST 77*  --   ALT 84*  --   ALKPHOS 244*  --   BILITOT 1.0  --    Cardiac Enzymes No results for input(s): TROPONINI in the last 168 hours. RADIOLOGY:  Ct Shoulder Left W Contrast  Result Date: 01/08/2016 CLINICAL DATA:  Left shoulder pain and swelling for 1 week. EXAM: CT OF THE LEFT SHOULDER WITH CONTRAST TECHNIQUE: Multidetector CT imaging was performed following the standard protocol during bolus administration of intravenous contrast. CONTRAST:  100 cc Isovue-300 COMPARISON:  None. FINDINGS: Large collection of fluid and air in the subcutaneous tissues overlying the left shoulder. This appears to be both superficial and also within the deltoid musculature. Some areas also noted in the left supraclavicular fossa. I do not see a discrete drainable soft tissue abscess. No findings to suggest septic arthritis involving  the glenohumeral joint or AC joint. No findings for osteomyelitis. The visualized left lung is clear. IMPRESSION: Large collection of air and fluid in the superficial soft tissues around the shoulder. Findings worrisome for gas-forming infection without discrete drainable abscess. Suspect small abscesses. No definite CT findings  for septic arthritis or osteomyelitis. Electronically Signed   By: Marijo Sanes M.D.   On: 01/08/2016 19:53   ASSESSMENT AND PLAN:  1. Clinical sepsis with fever, tachycardia and leukocytosis. 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 Hyponatremia, hypokalemia, hypomagnesemia. Replace 4 g magnesium IV 1 and oral magnesium. Replace oral potassium and potassium in IV fluids. Check electrolytes again tomorrow morning.  3. Anemia of chronic disease. Send iron studies and guaiac stools  4. Essential hypertension on no blood pressure medication at this time  5. Type 2 diabetes mellitus not on any diabetic medication at this time  6. 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 Follows with Dr. Nolon Stalls as outpatient.   D/w pt and wife on the phone  Case discussed with Care Management/Social Worker. Management plans discussed with the patient, family and they are in agreement.  CODE STATUS: FULL  DVT Prophylaxis: lovenonx  TOTAL critical TIME TAKING CARE OF THIS PATIENT:45* minutes.  >50% time spent on counselling and coordination of care  Awaiting transfer to tertiary care center  Note: This dictation was prepared with Dragon dictation along with smaller phrase technology. Any transcriptional errors that result from this process are unintentional.  Jarrad Mclees M.D on 01/09/2016 at 9:15 AM  Between 7am to 6pm - Pager - [phone]  After 6pm go to [email or web address] - password [email or web address] [facility]  Office  [phone]  CC: Primary care physician; Kathrine Haddock, NP

## 2016-01-09 NOTE — Consult Note (Signed)
Reason for Consult: Suspected gas gangrene. Referring Physician: Wray Kearns, M.D.; Adrian Prows, M.D.  Kent [name] is an 60 y.o. male.  HPI: The 60 year old male who first noted some soreness in the left shoulder approximately one week ago. The patient has had progressive swelling although minimal discomfort.  Past Medical History:  Diagnosis Date  . Arthritis   . Diabetes mellitus without complication ([place])   . Hyperlipidemia   . Hypertension   . Obesity   . Stomach cancer ([place])    had tumor removed- [facility]  . 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 gastric tumor removed  2010  . PERIPHERAL VASCULAR CATHETERIZATION Right 12/31/2015   Procedure: Lower Extremity Angiography;  Surgeon: Algernon Huxley, MD;  Location: [facility];  Service: Cardiovascular;  Laterality: Right;  . tongue mass biopsy      Family History  Problem Relation Age of Onset  . Heart disease Mother   . Hypertension Father   . Cancer Sister     stomach  . Heart disease Brother     MI  . Cancer Daughter   . Cancer Son     testicular  . ADD / ADHD Sister     Social History:  reports that he has never smoked. He has never used smokeless tobacco. He reports that he does not drink alcohol or use drugs.  Allergies: No Known Allergies  Medications: I have reviewed the patient's current medications.  Results for orders placed or performed during the hospital encounter of 01/08/16 (from the past 48 hour(s))  CULTURE, BLOOD (ROUTINE X 2) w Reflex to ID Panel     Status: None (Preliminary result)   Collection Time: 01/08/16  1:13 PM  Result Value Ref Range   Specimen Description BLOOD LEFT ASSIST CONTROL    Special Requests BOTTLES DRAWN AEROBIC AND ANAEROBIC [id number]    Culture NO GROWTH < 24 HOURS    Report Status PENDING   CULTURE, BLOOD (ROUTINE X 2) w Reflex to ID Panel      Status: None (Preliminary result)   Collection Time: 01/08/16  1:14 PM  Result Value Ref Range   Specimen Description BLOOD RIGHT ASSIST CONTROL    Special Requests BOTTLES DRAWN AEROBIC AND ANAEROBIC [id number]    Culture  Setup Time Organism ID to follow    Culture NO GROWTH < 24 HOURS    Report Status PENDING   Sedimentation rate     Status: Abnormal   Collection Time: 01/08/16  1:14 PM  Result Value Ref Range   Sed Rate 127 (H) 0 - 20 mm/hr  Ferritin     Status: Abnormal   Collection Time: 01/08/16  1:19 PM  Result Value Ref Range   Ferritin 1,927 (H) 24 - 336 ng/mL  Glucose, capillary     Status: Abnormal   Collection Time: 01/08/16  8:58 PM  Result Value Ref Range   Glucose-Capillary 209 (H) 65 - 99 mg/dL  Glucose, capillary     Status: Abnormal   Collection Time: 01/09/16  7:40 AM  Result Value Ref Range   Glucose-Capillary 115 (H) 65 - 99 mg/dL  Basic metabolic panel     Status: Abnormal   Collection Time: 01/09/16  9:09 AM  Result Value Ref Range   Sodium 129 (L) 135 - 145 mmol/L   Potassium 3.0 (L) 3.5 -  5.1 mmol/L   Chloride 93 (L) 101 - 111 mmol/L   CO2 26 22 - 32 mmol/L   Glucose, Bld 117 (H) 65 - 99 mg/dL   BUN 14 6 - 20 mg/dL   Creatinine, Ser 0.59 (L) 0.61 - 1.24 mg/dL   Calcium 7.6 (L) 8.9 - 10.3 mg/dL   GFR calc non Af Amer >60 >60 mL/min   GFR calc Af Amer >60 >60 mL/min    Comment: (NOTE) The eGFR has been calculated using the CKD EPI equation. This calculation has not been validated in all clinical situations. eGFR's persistently <60 mL/min signify possible Chronic Kidney Disease.    Anion gap 10 5 - 15  CBC     Status: Abnormal   Collection Time: 01/09/16  9:09 AM  Result Value Ref Range   WBC 12.2 (H) 3.8 - 10.6 K/uL   RBC 2.52 (L) 4.40 - 5.90 MIL/uL   Hemoglobin 7.4 (L) 13.0 - 18.0 g/dL   HCT 21.4 (L) 40.0 - 52.0 %   MCV 84.7 80.0 - 100.0 fL   MCH 29.2 26.0 - 34.0 pg   MCHC 34.5 32.0 - 36.0 g/dL   RDW 17.5 (H) 11.5 - 14.5 %    Platelets 153 150 - 440 K/uL  Magnesium     Status: None   Collection Time: 01/09/16  9:09 AM  Result Value Ref Range   Magnesium 1.7 1.7 - 2.4 mg/dL    Ct Shoulder Left W Contrast  Result Date: 01/08/2016 CLINICAL DATA:  Left shoulder pain and swelling for 1 week. EXAM: CT OF THE LEFT SHOULDER WITH CONTRAST TECHNIQUE: Multidetector CT imaging was performed following the standard protocol during bolus administration of intravenous contrast. CONTRAST:  100 cc Isovue-300 COMPARISON:  None. FINDINGS: Large collection of fluid and air in the subcutaneous tissues overlying the left shoulder. This appears to be both superficial and also within the deltoid musculature. Some areas also noted in the left supraclavicular fossa. I do not see a discrete drainable soft tissue abscess. No findings to suggest septic arthritis involving the glenohumeral joint or AC joint. No findings for osteomyelitis. The visualized left lung is clear. IMPRESSION: Large collection of air and fluid in the superficial soft tissues around the shoulder. Findings worrisome for gas-forming infection without discrete drainable abscess. Suspect small abscesses. No definite CT findings for septic arthritis or osteomyelitis. Electronically Signed   By: Marijo Sanes M.D.   On: 01/08/2016 19:53    Review of Systems  Constitutional: Positive for weight loss (Secondary to chemo/ radiation. ). Negative for chills and fever.  HENT: Positive for sore throat.   Eyes: Negative.   Respiratory: Negative.   Cardiovascular: Negative.   Gastrointestinal:       Dysphagia secondary to treatments. Tolerating Boost.   Genitourinary: Negative.   Musculoskeletal: Negative.   Skin:       Pain and soreness left shoulder area.   Neurological: Negative.  Dizziness: 33827.  Endo/Heme/Allergies: Negative.   Psychiatric/Behavioral: Negative.    Blood pressure 124/80, pulse (!) 105, temperature 99.1 F (37.3 C), temperature source Oral, resp. rate 16,  height _0  (1.702 m), weight 165 lb 6.4 oz (75 kg), SpO2 (!) 87 %. Physical Exam  HENT:  Head: Normocephalic.  Neck: No tracheal deviation present. No thyromegaly present.    Cardiovascular: Regular rhythm.  Tachycardia present.   No upper extremity edema noted.  Respiratory: Effort normal and breath sounds normal.  GI: Soft.    Musculoskeletal:  Arms:  Data:  Left shoulder CT of 01/08/2016:   IMPRESSION: Large collection of air and fluid in the superficial soft tissues around the shoulder. Findings worrisome for gas-forming infection without discrete drainable abscess. Suspect small abscesses.  No definite CT findings for septic arthritis or osteomyelitis.  Admission hemoglobin 8.4, 7.4 after hydration. Initial white blood cell count yesterday 19,200, hemoglobin 12.2. Electrolytes notable for modest depression a serum sodium and potassium.     Orthopedic assessment from 01/09/2016 reviewed.   Assessment  Gas gangrene involving the left shoulder, possibly related to recently treated left head and neck cancer.     Plan: Attempts to arrange for university assessment have been unsuccessful. The patient will be taken to the operating room today for incision and drainage.   Robert Bellow 01/09/2016, 10:35 AM

## 2016-01-09 NOTE — Progress Notes (Signed)
PIV placed in left forearm after 4 attempts.  Veins are very hard and roll a lot.  Vein walls are very thick.  PICC dc'd

## 2016-01-09 NOTE — Progress Notes (Signed)
Subjective: Patient notes left shoulder/anterior chest wall symptoms improved this AM. ("I can move my shoulder better.")  No other complaints.   Objective: Vital signs in last 24 hours: Temp:  [98 F (36.7 C)-100.9 F (38.3 C)] 99.8 F (37.7 C) (11/08 0423) Pulse Rate:  [106-134] 119 (11/08 0423) Resp:  [17-18] 18 (11/08 0423) BP: (111-131)/(61-88) 118/88 (11/08 0423) SpO2:  [97 %-100 %] 99 % (11/08 0423) Weight:  [75 kg (165 lb 6.4 oz)-79 kg (174 lb 3 oz)] 75 kg (165 lb 6.4 oz) (11/07 1300)  Intake/Output from previous day: 11/07 0701 - 11/08 0700 In: 240 [I.V.:240] Out: 600 [Urine:600] Intake/Output this shift: No intake/output data recorded.   Recent Labs  01/08/16 0945  HGB 8.4*    Recent Labs  01/08/16 0945  WBC 19.2*  RBC 2.93*  HCT 24.5*  PLT 217    Recent Labs  01/07/16 1322 01/08/16 0945  NA 125* 125*  K 3.3* 2.9*  CL 80* 90*  CO2 24 25  BUN 12 18  CREATININE 0.65* 0.67  GLUCOSE 156* 145*  CALCIUM 7.9* 8.0*   No results for input(s): LABPT, INR in the last 72 hours.  Physical Exam: Left shoulder exam unchanged vs. yesterday afternoon. Moves shoulder actively with only minimal discomfort.  X-rays: CT scan of chest shows apparent fluid collection with interspersed air bubbles involving subcutaneous tissues and possibly the anterior deltoid region, but no discreet abscess requiring drainage.  Assessment: Cellulitis anterior chest wall region.  Plan: CT scan consistent with subcutaneous infection of anterior left chest wall region.  Given that patient is feeling better and that he is hemodynamically stable, I will await ID's recommendation as to whether this can be managed with additional IV antibiotics given his frail condition medically, or whether he would recommend formal debridement.  If he recommends the latter, then it might be wise to obtain General Surgery input as such a debridement would most likely result in an open wound which would  require prolonged wound care and possible soft tissue coverage. The patient has been made NPO as a precaution.   Kent [name] 01/09/2016, 7:43 AM

## 2016-01-09 NOTE — Progress Notes (Signed)
Patient was emergently taken to the OR by Dr byrnett.   Appreciate Dr byrnett's help in taking care of this pt's life threatening infection.

## 2016-01-09 NOTE — Progress Notes (Signed)
Patient returned from OR awake and alert, denies pain.  Has wound vac to his left shoulder wound.  It is intact and appears to be working well.

## 2016-01-09 NOTE — Anesthesia Preprocedure Evaluation (Signed)
Anesthesia Evaluation  Patient identified by MRN, date of birth, ID band Patient awake    Reviewed: Allergy & Precautions, NPO status , Patient's Chart, lab work & pertinent test results  Airway Mallampati: III       Dental  (+) Edentulous Upper   Pulmonary neg pulmonary ROS,    breath sounds clear to auscultation       Cardiovascular Exercise Tolerance: Poor hypertension, Pt. on medications  Rhythm:Regular Rate:Tachycardia     Neuro/Psych negative neurological ROS     GI/Hepatic negative GI ROS, (+) Hepatitis -  Endo/Other  diabetes, Type 1, Insulin Dependent  Renal/GU      Musculoskeletal   Abdominal (+) + obese,   Peds  Hematology  (+) anemia ,   Anesthesia Other Findings   Reproductive/Obstetrics                             Anesthesia Physical Anesthesia Plan  ASA: III and emergent  Anesthesia Plan: General   Post-op Pain Management:    Induction: Intravenous  Airway Management Planned: Oral ETT  Additional Equipment:   Intra-op Plan:   Post-operative Plan: Extubation in OR  Informed Consent: I have reviewed the patients History and Physical, chart, labs and discussed the procedure including the risks, benefits and alternatives for the proposed anesthesia with the patient or authorized representative who has indicated his/her understanding and acceptance.     Plan Discussed with: CRNA  Anesthesia Plan Comments:         Anesthesia Quick Evaluation

## 2016-01-09 NOTE — Anesthesia Postprocedure Evaluation (Signed)
Anesthesia Post Note  Patient: Kent [name]  Procedure(s) Performed: Procedure(s) (LRB): IRRIGATION AND DEBRIDEMENT EXTREMITY (Left)  Patient location during evaluation: PACU Anesthesia Type: General Level of consciousness: awake Pain management: pain level controlled Vital Signs Assessment: post-procedure vital signs reviewed and stable Respiratory status: spontaneous breathing Cardiovascular status: stable Anesthetic complications: no    Last Vitals:  Vitals:   01/09/16 0815 01/09/16 1230  BP: 124/80 137/78  Pulse: (!) 105 100  Resp: 16 (!) 37  Temp: 37.3 C 37.3 C    Last Pain:  Vitals:   01/09/16 0815  TempSrc: Oral                 VAN STAVEREN,Alvis Pulcini

## 2016-01-09 NOTE — Anesthesia Procedure Notes (Signed)
Procedure Name: Intubation Date/Time: 01/09/2016 11:13 AM Performed by: Dionne Bucy Pre-anesthesia Checklist: Patient identified, Patient being monitored, Timeout performed, Emergency Drugs available and Suction available Patient Re-evaluated:Patient Re-evaluated prior to inductionOxygen Delivery Method: Circle system utilized Preoxygenation: Pre-oxygenation with 100% oxygen Intubation Type: IV induction Ventilation: Mask ventilation without difficulty Laryngoscope Size: Mac and 4 Grade View: Grade II Tube type: Oral Tube size: 7.5 mm Number of attempts: 1 (DL x1 per Dr. Izetta Dakin) Airway Equipment and Method: Stylet Placement Confirmation: ETT inserted through vocal cords under direct vision,  positive ETCO2 and breath sounds checked- equal and bilateral Secured at: 22 cm Tube secured with: Tape Dental Injury: Teeth and Oropharynx as per pre-operative assessment

## 2016-01-09 NOTE — Progress Notes (Signed)
Initial Nutrition Assessment  DOCUMENTATION CODES:   Severe malnutrition in context of chronic illness  INTERVENTION:  1. Continue Boost Breeze po TID, each supplement provides 250 kcal and 9 grams of protein 2. Magic cup TID with meals, each supplement provides 290 kcal and 9 grams of protein 3. Patient would likely benefit from PEG tube given poor PO intake and weight loss.  NUTRITION DIAGNOSIS:   Malnutrition related to chronic illness as evidenced by percent weight loss, moderate depletion of body fat, moderate depletions of muscle mass.  GOAL:   Patient will meet greater than or equal to 90% of their needs  MONITOR:   PO intake, I & O's, Labs, Weight trends, Supplement acceptance  REASON FOR ASSESSMENT:   Malnutrition Screening Tool    ASSESSMENT:   Kent [name]  is a 60 y.o. male sent into the hospital for fever. Patient states he been feeling really weak. He is not eating or drinking very well. His toe turned black around 2 weeks ago and was seen by Dr. dew but family thinks it's getting worse.   Spoke with Kent [name] at bedside. He continues to have poor PO intake expressed from previous admission. He states that radiation "really messed him up, everything burns" when he swallows and eats PO. Despite this he admits to eating 2-3 meals per day. He also admits to losing weight from "200 something," per chart he exhibits a 40#/19.5% severe wt loss over 2 months. He is NPO at this time. Nutrition-Focused physical exam completed. Findings are moderate fat depletion, moderate muscle depletion, and no edema.  Labs and medications reviewed: CBGs 115-209, Na 129, K 3.0, Elevated LFTs KCL NS w/ K+ @ 39m/hr  Diet Order:  Diet NPO time specified  Skin:  Wound (see comment) (Necrotic R Toe)  Last BM:  01/06/2016  Height:   Ht Readings from Last 1 Encounters:  01/08/16 '5\' 7"'$  (1.702 m)    Weight:   Wt Readings from Last 1 Encounters:  01/08/16 165 lb 6.4 oz (75 kg)     Ideal Body Weight:  67.27 kg  BMI:  Body mass index is 25.91 kg/m.  Estimated Nutritional Needs:   Kcal:  [address]396-8864calories  Protein:  92-113 gm  Fluid:  >/= 2.3L  EDUCATION NEEDS:   No education needs identified at this time  WSatira Anis Nakiesha Rumsey, MS, RD LDN Inpatient Clinical Dietitian Pager [phone]

## 2016-01-09 NOTE — Consult Note (Signed)
[facility] Vascular Consult Note  MRN : [id number]  Wilma Wuthrich Kimura is a 60 y.o. (18-Apr-1955) male who presents with chief complaint of No chief complaint on file. Marland Kitchen  History of Present Illness: I am asked to evaluate the patient by Dr. Vito Berger for ischemic changes to the right second toe. The patient is a 60 y.o. 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  The patient has had several recent hospitalizations secondary to complications of his tongue carcinoma particularly with dehydration and electrolyte imbalances. He was also noted to have a gangrenous tip of his right second toe.  Dr. Lucky Cowboy evaluated his toe and subsequently angiography was performed which demonstrated in-line flow via the posterior tibial dorsalis pedis appears occluded nevertheless he does appear to have adequate perfusion. On this admission the family is concerned that his toes gotten worse. Earlier today he did undergo debridement for necrotizing fasciitis of the left shoulder and now has a VAC in place.  Current Facility-Administered Medications  Medication Dose Route Frequency Provider Last Rate Last Dose  . 0.9 % NaCl with KCl 20 mEq/ L  infusion   Intravenous Continuous Loletha Grayer, MD 75 mL/hr at 01/09/16 1755    . acetaminophen (TYLENOL) tablet 650 mg  650 mg Oral Q6H PRN Loletha Grayer, MD       Or  . acetaminophen (TYLENOL) suppository 650 mg  650 mg Rectal Q6H PRN Loletha Grayer, MD      . aspirin EC tablet 81 mg  81 mg Oral Daily PRN Robert Bellow, MD      . clindamycin (CLEOCIN) IVPB 600 mg  600 mg Intravenous Q6H Fritzi Mandes, MD   600 mg at 01/09/16 1755  . feeding supplement (BOOST / RESOURCE BREEZE) liquid 1 Container  1 Container Oral TID BM Loletha Grayer, MD   1 Container at  01/08/16 1440  . feeding supplement (BOOST / RESOURCE BREEZE) liquid 1 Container  1 Container Oral TID BM Robert Bellow, MD      . insulin aspart (novoLOG) injection 0-5 Units  0-5 Units Subcutaneous QHS Loletha Grayer, MD   2 Units at 01/08/16 2113  . insulin aspart (novoLOG) injection 0-9 Units  0-9 Units Subcutaneous TID WC Loletha Grayer, MD   2 Units at 01/08/16 1738  . iopamidol (ISOVUE-300) 61 % injection 100 mL  100 mL Intravenous Once PRN Loletha Grayer, MD      . lidocaine (XYLOCAINE) 2 % viscous mouth solution 15 mL  15 mL Mouth/Throat Q6H PRN Loletha Grayer, MD   15 mL at 01/09/16 0441  . LORazepam (ATIVAN) tablet 0.5 mg  0.5 mg Oral Q6H PRN Loletha Grayer, MD      . magic mouthwash  5 mL Oral QID PRN Loletha Grayer, MD   5 mL at 01/08/16 2114  . magnesium oxide (MAG-OX) tablet 400 mg  400 mg Oral Q24H Loletha Grayer, MD   400 mg at 01/09/16 1418  . MEDLINE mouth rinse  15 mL Mouth Rinse BID Loletha Grayer, MD   15 mL at 01/08/16 2200  . morphine 4 MG/ML injection 2 mg  2 mg Intravenous Q2H PRN Robert Bellow, MD      . ondansetron [facility]) tablet 8 mg  8 mg Oral BID PRN Loletha Grayer, MD      .  oxyCODONE-acetaminophen (PERCOCET/ROXICET) 5-325 MG per tablet 1 tablet  1 tablet Oral Q6H PRN Loletha Grayer, MD      . piperacillin-tazobactam (ZOSYN) IVPB 3.375 g  3.375 g Intravenous [id number] [place], RPH   3.375 g at 01/09/16 1418  . potassium chloride SA (K-DUR,KLOR-CON) CR tablet 40 mEq  40 mEq Oral BID Loletha Grayer, MD   40 mEq at 01/09/16 1013  . sodium chloride flush (NS) 0.9 % injection 10-40 mL  10-40 mL Intracatheter Q12H Loletha Grayer, MD   10 mL at 01/08/16 2200  . sodium chloride flush (NS) 0.9 % injection 3 mL  3 mL Intravenous Q12H Loletha Grayer, MD   3 mL at 01/09/16 0809    Past Medical History:  Diagnosis Date  . Arthritis   . Diabetes mellitus without complication ([place])   . Hyperlipidemia   . Hypertension   . Obesity   . Stomach cancer  ([place])    had tumor removed- [facility]  . Tongue cancer ([place])    Stage IV squamous cell carcinoma of the base of the tongue/left tonsil    Past Surgical History:  Procedure Laterality Date  . gastric tumor removed  2010  . I&D EXTREMITY Left 01/09/2016   Procedure: IRRIGATION AND DEBRIDEMENT EXTREMITY;  Surgeon: Robert Bellow, MD;  Location: [facility] ORS;  Service: General;  Laterality: Left;  . PERIPHERAL VASCULAR CATHETERIZATION Right 12/31/2015   Procedure: Lower Extremity Angiography;  Surgeon: Algernon Huxley, MD;  Location: [facility];  Service: Cardiovascular;  Laterality: Right;  . tongue mass biopsy      Social History Social History  Substance Use Topics  . Smoking status: Never Smoker  . Smokeless tobacco: Never Used  . Alcohol use No    Family History Family History  Problem Relation Age of Onset  . Heart disease Mother   . Hypertension Father   . Cancer Sister     stomach  . Heart disease Brother     MI  . Cancer Daughter   . Cancer Son     testicular  . ADD / ADHD Sister     No Known Allergies   REVIEW OF SYSTEMS (Negative unless checked)  Constitutional: '[]'$ Weight loss  '[]'$ Fever  '[]'$ Chills Cardiac: '[]'$ Chest pain   '[]'$ Chest pressure   '[]'$ Palpitations   '[]'$ Shortness of breath when laying flat   '[]'$ Shortness of breath at rest   '[]'$ Shortness of breath with exertion. Vascular:  '[]'$ Pain in legs with walking   '[x]'$ Pain in legs at rest   '[]'$ Pain in legs when laying flat   '[]'$ Claudication   '[]'$ Pain in feet when walking  '[]'$ Pain in feet at rest  '[]'$ Pain in feet when laying flat   '[]'$ History of DVT   '[]'$ Phlebitis   '[]'$ Swelling in legs   '[]'$ Varicose veins   '[]'$ Non-healing ulcers Pulmonary:   '[]'$ Uses home oxygen   '[]'$ Productive cough   '[]'$ Hemoptysis   '[]'$ Wheeze  '[]'$ COPD   '[]'$ Asthma Neurologic:  '[]'$ Dizziness  '[]'$ Blackouts   '[]'$ Seizures   '[]'$ History of stroke   '[]'$ History of TIA  '[x]'$ Aphasia   '[]'$ Temporary blindness   '[]'$ Dysphagia   '[]'$ Weakness or numbness in arms   '[]'$ Weakness or numbness in  legs Musculoskeletal:  '[]'$ Arthritis   '[]'$ Joint swelling   '[x]'$ Joint pain   '[]'$ Low back pain Hematologic:  '[]'$ Easy bruising  '[]'$ Easy bleeding   '[x]'$ Hypercoagulable state   '[]'$ Anemic  '[]'$ Hepatitis Gastrointestinal:  '[]'$ Blood in stool   '[]'$ Vomiting blood  '[]'$ Gastroesophageal reflux/heartburn   '[]'$ Difficulty swallowing. Genitourinary:  '[]'$ Chronic kidney disease   '[]'$   Difficult urination  '[]'$ Frequent urination  '[]'$ Burning with urination   '[]'$ Blood in urine Skin:  '[]'$ Rashes   '[x]'$ Ulcers   '[x]'$ Wounds Psychological:  '[]'$ History of anxiety   '[]'$  History of major depression.  Physical Examination  Vitals:   01/09/16 1312 01/09/16 1319 01/09/16 1342 01/09/16 1403  BP: (!) 135/97 118/90 125/60 128/76  Pulse: 98 92 94 (!) 101  Resp: (!) 26 (!) 21 (!) 22 17  Temp:   97.9 F (36.6 C) 97.6 F (36.4 C)  TempSrc:      SpO2: 96% 97% 95% 96%  Weight:      Height:       Body mass index is 25.91 kg/m. Gen:  WD/WN, NAD Head: [place]/AT, No temporalis wasting. Prominent temp pulse not noted. Ear/Nose/Throat: Hearing grossly intact, nares w/o erythema or drainage, oropharynx Changes to the lips and tongue consistent with his tumor burden  Eyes: Sclera non-icteric, conjunctiva clear Neck: Trachea midline.  No JVD.  Pulmonary:  Good air movement, respirations not labored, equal bilaterally.  Cardiac: RRR, normal S1, S2. Vascular: Distal tip gangrene right second toe no associated cellulitis; bilateral popliteal impulses are quite broad Vessel Right Left  Radial Palpable Palpable  Ulnar Palpable Palpable  Brachial Palpable Palpable  Carotid Palpable, without bruit Palpable, without bruit  Aorta Not palpable N/A  Femoral Palpable Palpable  Popliteal Palpable Palpable  PT Palpable Palpable  DP Not Palpable Not Palpable   Gastrointestinal: soft, non-tender/non-distended. No guarding/reflex.  Musculoskeletal: M/S 5/5 throughout.  Extremities without ischemic changes.  No deformity or atrophy. No edema. Neurologic: Sensation  grossly intact in extremities.  Symmetrical.  Speech is fluent. Motor exam as listed above. Psychiatric: Judgment intact, Mood & affect appropriate for pt's clinical situation. Dermatologic: No rashes.  Large ulcers noted left shoulder.  No cellulitis or open wounds. Lymph : + Cervical, Axillary, or Inguinal lymphadenopathy.      CBC Lab Results  Component Value Date   WBC 12.2 (H) 01/09/2016   HGB 7.4 (L) 01/09/2016   HCT 21.4 (L) 01/09/2016   MCV 84.7 01/09/2016   PLT 153 01/09/2016    BMET    Component Value Date/Time   NA 129 (L) 01/09/2016 0909   NA 125 (L) 01/07/2016 1322   NA 127 (L) 02/14/2013 1204   K 3.0 (L) 01/09/2016 0909   K 3.8 02/14/2013 1204   CL 93 (L) 01/09/2016 0909   CL 93 (L) 02/14/2013 1204   CO2 26 01/09/2016 0909   CO2 28 02/14/2013 1204   GLUCOSE 117 (H) 01/09/2016 0909   GLUCOSE 420 (H) 02/14/2013 1204   BUN 14 01/09/2016 0909   BUN 12 01/07/2016 1322   BUN 21 (H) 02/14/2013 1204   CREATININE 0.59 (L) 01/09/2016 0909   CREATININE 1.63 (H) 02/14/2013 1204   CALCIUM 7.6 (L) 01/09/2016 0909   CALCIUM 8.8 02/14/2013 1204   GFRNONAA >60 01/09/2016 0909   GFRNONAA 46 (L) 02/14/2013 1204   GFRAA >60 01/09/2016 0909   GFRAA 53 (L) 02/14/2013 1204   Estimated Creatinine Clearance: 90.7 mL/min (by C-G formula based on SCr of 0.59 mg/dL (L)).  COAG No results found for: INR, PROTIME  Radiology Ct Shoulder Left W Contrast  Result Date: 01/08/2016 CLINICAL DATA:  Left shoulder pain and swelling for 1 week. EXAM: CT OF THE LEFT SHOULDER WITH CONTRAST TECHNIQUE: Multidetector CT imaging was performed following the standard protocol during bolus administration of intravenous contrast. CONTRAST:  100 cc Isovue-300 COMPARISON:  None. FINDINGS: Large collection of fluid and  air in the subcutaneous tissues overlying the left shoulder. This appears to be both superficial and also within the deltoid musculature. Some areas also noted in the left  supraclavicular fossa. I do not see a discrete drainable soft tissue abscess. No findings to suggest septic arthritis involving the glenohumeral joint or AC joint. No findings for osteomyelitis. The visualized left lung is clear. IMPRESSION: Large collection of air and fluid in the superficial soft tissues around the shoulder. Findings worrisome for gas-forming infection without discrete drainable abscess. Suspect small abscesses. No definite CT findings for septic arthritis or osteomyelitis. Electronically Signed   By: Marijo Sanes M.D.   On: 01/08/2016 19:53   Dg Chest Port 1 View  Result Date: 12/11/2015 CLINICAL DATA:  PICC placement. EXAM: PORTABLE CHEST 1 VIEW COMPARISON:  12/10/2015 FINDINGS: Tip of the right upper extremity PICC in the mid distal SVC. No pneumothorax. Lungs are clear. Unchanged cardiomediastinal contours. No pleural fluid or focal airspace disease. IMPRESSION: Tip of the right upper extremity PICC in the mid distal SVC. Electronically Signed   By: Jeb Levering M.D.   On: 12/11/2015 21:11   Nm Outside Films Head/face  Result Date: 12/17/2015 This examination belongs to an outside facility and is stored here for comparison purposes only.  Contact the originating outside institution for any associated report or interpretation.     Assessment/Plan 1. Gangrenous changes right second toe: Family is requesting that the patient undergo toe amputation I discussed the risks and benefits. He is due to return to the operating room on Friday for further debridement I will hopefully arrange distal toe amp dictation at that time. 2. 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 Carcinoma of the tongue: Patient is to continue his treatments through the cancer center. 4.   Hypertension patient will continue his any hypertensive medications as ordered no changes at this time 5.  Diabetes: Patient will continue a carb-controlled diet as well as his oral hypoglycemic medications   Hortencia Pilar, MD  01/09/2016 7:04 PM    This note was created with Dragon medical transcription system.  Any error is purely unintentional

## 2016-01-09 NOTE — Transfer of Care (Signed)
Immediate Anesthesia Transfer of Care Note  Patient: Kent [name]  Procedure(s) Performed: Procedure(s): IRRIGATION AND DEBRIDEMENT EXTREMITY (Left)  Patient Location: PACU  Anesthesia Type:General  Level of Consciousness: sedated  Airway & Oxygen Therapy: Patient Spontanous Breathing and Patient connected to face mask oxygen  Post-op Assessment: Report given to RN and Post -op Vital signs reviewed and stable  Post vital signs: Reviewed and stable  Last Vitals:  Vitals:   01/09/16 0815 01/09/16 1230  BP: 124/80 137/78  Pulse: (!) 105 100  Resp: 16 (!) 37  Temp: 37.3 C 37.3 C    Last Pain:  Vitals:   01/09/16 0815  TempSrc: Oral         Complications: No anesthesia complications

## 2016-01-09 NOTE — Op Note (Signed)
Preoperative diagnosis: Necrotizing fasciitis involving the left superior shoulder.  Postoperative diagnosis: Same.  Procedure: Debridement necrotizing fasciitis left shoulder. Wound VAC application.  Operating surgeon: Loa Socks, M.D.  Anesthesia: Gen. endotracheal.  Estimated blood loss: 50 cc.  Clinical note: This 60 year old male had a one-week history of vague left shoulder discomfort. It was not until 24 hours prior to admission that he noticed redness and swelling in the area. CT scan obtained earlier shows evidence of gas and soft tissue. Muscles appear uninvolved. Joints appear uninvolved. He is felt to be a candidate for urgent debridement.  Operative note: The patient was taken to the operating room and underwent general endotracheal anesthesia without difficulty. A roll was placed behind the left shoulder and the neck and shoulder prepped with ChloraPrep and draped. A radial incision was made extending from the base of the neck to the tip of the shoulder. Skin was incised sharply and hemostasis achieved electrocautery and 3-0 Vicryl suture ligatures. There was an area of necrotic tissue approximately 3 cm in thickness and 5 x 7 cm in width that was excised with a combination of sharp and cautery dissection. The skin appeared viable except of the posterior aspect of the incision where it was resected. The procedure exposed the acromioclavicular joint. There was purulent tissue tracking along the course of the supraspinatus muscle. This was removed and the area curetted. At the end of the procedure good hemostasis was noted and all the small 2-3 mm areas of questionably nonviable tissue were appreciated. The wound was irrigated and a black wound VAC dressing applied. The edges of the wound were protected with Adaptic gauze. The wound VAC was set for a 125 mm continuous suction.  The patient tolerated the procedure well and was taken to recovery in stable condition.

## 2016-01-09 NOTE — Progress Notes (Signed)
Spoke with [facility] gen surgery Dr Donella Stade who wanted to d/w Dr Azalee Course as to reason why pt cannot be operated here. Do not have any back surgeon here at [facility] and dr loflin was in surgery for 6 hours.   Spoke with Dr Naida Sleight at [facility] who graciously accepted the pt but waiting for bed to open up  Dr byrnett paged to see pt  Spoke with [facility] Dr Sharen Counter who has gracioulsy accepeted pt alos and waiting to hear back if they have a bed  Last resort I have is to try [facility] if [facility] do not work  Time spent 45 mins  Pt has been updated

## 2016-01-10 ENCOUNTER — Inpatient Hospital Stay: Payer: Managed Care, Other (non HMO) | Admitting: Hematology and Oncology

## 2016-01-10 ENCOUNTER — Ambulatory Visit (HOSPITAL_COMMUNITY)
Admission: AD | Admit: 2016-01-10 | Discharge: 2016-01-10 | Disposition: A | Discharge status: Home / Self Care | Payer: Managed Care, Other (non HMO) | Source: Other Acute Inpatient Hospital | Attending: Internal Medicine | Admitting: Internal Medicine

## 2016-01-10 DIAGNOSIS — A419 Sepsis, unspecified organism: Secondary | ICD-10-CM | POA: Insufficient documentation

## 2016-01-10 LAB — SURGICAL PATHOLOGY

## 2016-01-10 LAB — ECHOCARDIOGRAM COMPLETE
Height: 67 in
Weight: 2646.4 [oz_av]

## 2016-01-10 LAB — GLUCOSE, CAPILLARY
GLUCOSE-CAPILLARY: 107 mg/dL — AB (ref 65–99)
GLUCOSE-CAPILLARY: 501 mg/dL — AB (ref 65–99)
GLUCOSE-CAPILLARY: 91 mg/dL (ref 65–99)
GLUCOSE-CAPILLARY: 85 mg/dL (ref 65–99)

## 2016-01-10 MED ORDER — INSULIN GLARGINE 100 UNIT/ML ~~LOC~~ SOLN: 20.0000 [IU] | Freq: Every day | SUBCUTANEOUS | 11 refills | Status: DC

## 2016-01-10 MED ORDER — GLIPIZIDE 5 MG PO TABS: 5.0000 mg | ORAL_TABLET | Freq: Two times a day (BID) | ORAL | 0 refills | Status: DC

## 2016-01-10 MED ORDER — MAGNESIUM SULFATE 2 GM/50ML IV SOLN
2.0000 g | Freq: Once | INTRAVENOUS | Status: AC
  Administered 2016-01-10: 2 g via INTRAVENOUS
  Filled 2016-01-10: qty 50

## 2016-01-10 MED ORDER — POTASSIUM CHLORIDE IN NACL 20-0.9 MEQ/L-% IV SOLN: INTRAVENOUS | Status: DC

## 2016-01-10 MED ORDER — OXYCODONE-ACETAMINOPHEN 5-325 MG PO TABS
1.0000 | ORAL_TABLET | Freq: Four times a day (QID) | ORAL | Status: DC | PRN
  Administered 2016-01-10: 1 via ORAL
  Filled 2016-01-10: qty 1

## 2016-01-10 MED ORDER — POTASSIUM CHLORIDE CRYS ER 20 MEQ PO TBCR
40.0000 meq | EXTENDED_RELEASE_TABLET | ORAL | Status: DC
  Administered 2016-01-10: 40 meq via ORAL
  Filled 2016-01-10: qty 2

## 2016-01-10 MED ORDER — GLIPIZIDE 5 MG PO TABS
5.0000 mg | ORAL_TABLET | Freq: Two times a day (BID) | ORAL | Status: DC
  Filled 2016-01-10: qty 1

## 2016-01-10 MED ORDER — SODIUM CHLORIDE 0.9 % IV SOLN
INTRAVENOUS | Status: DC
  Administered 2016-01-10: 12:00:00 via INTRAVENOUS

## 2016-01-10 MED ORDER — INSULIN GLARGINE 100 UNIT/ML ~~LOC~~ SOLN
20.0000 [IU] | Freq: Every day | SUBCUTANEOUS | Status: DC
  Administered 2016-01-10: 20 [IU] via SUBCUTANEOUS
  Filled 2016-01-10 (×2): qty 0.2

## 2016-01-10 MED ORDER — PIPERACILLIN SOD-TAZOBACTAM SO 3.375 (3-0.375) G IV SOLR: 3.3750 g | Freq: Four times a day (QID) | INTRAVENOUS | 0 refills | Status: DC

## 2016-01-10 MED ORDER — POTASSIUM CHLORIDE CRYS ER 20 MEQ PO TBCR
40.0000 meq | EXTENDED_RELEASE_TABLET | ORAL | Status: DC
  Filled 2016-01-10: qty 2

## 2016-01-10 NOTE — Discharge Summary (Signed)
[facility] [place] NAME: Kent [name]    MR#:  [id number]  DATE OF BIRTH:  1955-11-21  DATE OF ADMISSION:  01/08/2016 ADMITTING PHYSICIAN: Loletha Grayer, MD  DATE OF DISCHARGE: 01/10/16  PRIMARY CARE PHYSICIAN: Kathrine Haddock, NP    ADMISSION DIAGNOSIS:  septic dehydration elevated liver enzymes gangreen septic shoulder LT N/A n/a  DISCHARGE DIAGNOSIS:  Left upper chest wall necrotizing fasciitis s/p surgery on 11/817 MSSA sepsis H/o Tongue caacer s/p chemo and radiation Right 2nd toe gangrene H/o severe PAD  SECONDARY DIAGNOSIS:   Past Medical History:  Diagnosis Date  . Arthritis   . Diabetes mellitus without complication ([place])   . Hyperlipidemia   . Hypertension   . Obesity   . Stomach cancer ([place])    had tumor removed- [facility]  . 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 male sent into the hospital for fever. Patient states he been feeling really weak. He is not eating or drinking very well. His toe turned black around 2 weeks ago and was seen by Dr. dew but family thinks it's getting worse. His left shoulder is feeling heat on it for the last 1 week and a burning type pain. He is also had decreased range of motion in his left shoulder. For the past 4-5 weeks she's had a PICC line in his right arm secondary to poor IV access and he's been receiving IV fluids on a daily basis at the cancer center.  1. Clinical sepsis with fever, tachycardia and leukocytosis.  -Blood cultures Positive for staph aureus  Source of the sepsis is left shoulder necrotizing fascitis -IV vancomycin,zosyn and added clinda---changed to IV zosyn by ID --bp stable -wbc 19K---12K  2. Hyponatremia, hypokalemia, hypomagnesemia.  -Recieved 4 g magnesium IV 1 and oral magnesium.  -Replace oral potassium and potassium in IV fluids.  3. Anemia  of chronic disease.  stable  4. Essential hypertension on no blood pressure medication at this time  5. Type 2 diabetes mellitus  Will start Lantus and glipizide  6. 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 Follows with Dr. Nolon Stalls as outpatient.  Pt will need further exploration and intensive wound care. He will transfer to [facility] if wife is agreeable and when bed is available. Pt has been accepted by Dr Sharen Counter Gen surgery at [facility]  CONSULTS OBTAINED:  Treatment Team:  Leonel Ramsay, MD Algernon Huxley, MD Corky Mull, MD Robert Bellow, MD Hubbard Robinson, MD  DRUG ALLERGIES:  No Known Allergies  DISCHARGE MEDICATIONS:   Current Discharge Medication List    START taking these medications   Details  glipiZIDE (GLUCOTROL) 5 MG tablet Take 1 tablet (5 mg total) by mouth 2 (two) times daily before a meal. Qty: 60 tablet, Refills: 0    insulin glargine (LANTUS) 100 UNIT/ML injection Inject 0.2 mLs (20 Units total) into the skin daily. Qty: 10 mL, Refills: 11    piperacillin-tazobactam (ZOSYN) 3.375 (3-0.375) g injection Inject 3,375 mg (3.375 g total) into the vein every 6 (six) hours. Qty: 1 each, Refills: 0      CONTINUE these medications which have NOT CHANGED   Details  aspirin EC 81 MG tablet Take 81 mg by mouth daily as needed for moderate pain.  feeding supplement (BOOST / RESOURCE BREEZE) LIQD Take 1 Container by mouth 3 (three) times daily between meals. Qty: 20000 mL, Refills: 2    lidocaine (XYLOCAINE) 2 % solution Use as directed 15 mLs in the mouth or throat every 6 (six) hours as needed for mouth pain. Qty: 100 mL, Refills: 0    LORazepam (ATIVAN) 0.5 MG tablet Take 1 tablet (0.5 mg total) by mouth every 6 (six) hours as needed (Nausea or vomiting). Qty: 30 tablet, Refills: 0   Associated Diagnoses: Carcinoma of base of tongue (HCC)    magic mouthwash SOLN Take 5 mLs by mouth 4 (four) times daily  as needed for mouth pain. Qty: 480 mL, Refills: 1    meloxicam (MOBIC) 15 MG tablet Take 1 tablet (15 mg total) by mouth daily. Qty: 30 tablet, Refills: 0    ondansetron (ZOFRAN) 8 MG tablet Take 1 tablet (8 mg total) by mouth 2 (two) times daily as needed. Start on the third day after chemotherapy. Qty: 30 tablet, Refills: 1   Associated Diagnoses: Carcinoma of base of tongue (HCC)    oxyCODONE-acetaminophen (PERCOCET/ROXICET) 5-325 MG tablet Take 1 tablet by mouth every 6 (six) hours as needed for severe pain. Qty: 15 tablet, Refills: 0   Associated Diagnoses: Hypomagnesemia; Carcinoma of base of tongue ([place]); Hyponatremia    potassium chloride (KLOR-CON) 20 MEQ packet Take 40 mEq by mouth 2 (two) times daily. Qty: 60 packet, Refills: 0        If you experience worsening of your admission symptoms, develop shortness of breath, life threatening emergency, suicidal or homicidal thoughts you must seek medical attention immediately by calling 911 or calling your MD immediately  if symptoms less severe.  You Must read complete instructions/literature along with all the possible adverse reactions/side effects for all the Medicines you take and that have been prescribed to you. Take any new Medicines after you have completely understood and accept all the possible adverse reactions/side effects.   Please note  You were cared for by a hospitalist during your hospital stay. If you have any questions about your discharge medications or the care you received while you were in the hospital after you are discharged, you can call the unit and asked to speak with the hospitalist on call if the hospitalist that took care of you is not available. Once you are discharged, your primary care physician will handle any further medical issues. Please note that NO REFILLS for any discharge medications will be authorized once you are discharged, as it is imperative that you return to your primary care physician  (or establish a relationship with a primary care physician if you do not have one) for your aftercare needs so that they can reassess your need for medications and monitor your lab values.   DATA REVIEW:   CBC   Recent Labs Lab 01/09/16 0909  WBC 12.2*  HGB 7.4*  HCT 21.4*  PLT 153    Chemistries   Recent Labs Lab 01/07/16 1322  01/09/16 0909  NA 125*  < > 129*  K 3.3*  < > 3.0*  CL 80*  < > 93*  CO2 24  < > 26  GLUCOSE 156*  < > 117*  BUN 12  < > 14  CREATININE 0.65*  < > 0.59*  CALCIUM 7.9*  < > 7.6*  MG  --   < > 1.7  AST 77*  --   --   ALT 84*  --   --  ALKPHOS 244*  --   --   BILITOT 1.0  --   --   < > = values in this interval not displayed.  Microbiology Results   Recent Results (from the past 240 hour(s))  CULTURE, BLOOD (ROUTINE X 2) w Reflex to ID Panel     Status: None (Preliminary result)   Collection Time: 01/08/16  1:13 PM  Result Value Ref Range Status   Specimen Description BLOOD LEFT ASSIST CONTROL  Final   Special Requests BOTTLES DRAWN AEROBIC AND ANAEROBIC 4CCAERO,6CCANA  Final   Culture NO GROWTH 2 DAYS  Final   Report Status PENDING  Incomplete  CULTURE, BLOOD (ROUTINE X 2) w Reflex to ID Panel     Status: Abnormal (Preliminary result)   Collection Time: 01/08/16  1:14 PM  Result Value Ref Range Status   Specimen Description BLOOD RIGHT ASSIST CONTROL  Final   Special Requests BOTTLES DRAWN AEROBIC AND ANAEROBIC [place]  Final   Culture  Setup Time   Final    GRAM POSITIVE COCCI ANAEROBIC BOTTLE ONLY CRITICAL RESULT CALLED TO, READ BACK BY AND VERIFIED WITH: CHRISTINE KATSOUDAS 01/09/16 1146 SGD    Culture (A)  Final    STAPHYLOCOCCUS AUREUS SUSCEPTIBILITIES TO FOLLOW Performed at [facility]    Report Status PENDING  Incomplete  Blood Culture ID Panel (Reflexed)     Status: Abnormal   Collection Time: 01/08/16  1:14 PM  Result Value Ref Range Status   Enterococcus species NOT DETECTED NOT DETECTED Final    Listeria monocytogenes NOT DETECTED NOT DETECTED Final   Staphylococcus species DETECTED (A) NOT DETECTED Final    Comment: CRITICAL RESULT CALLED TO, READ BACK BY AND VERIFIED WITH: CHRISTINE KATSOUDAS 01/09/16 1146 SGD    Staphylococcus aureus DETECTED (A) NOT DETECTED Final    Comment: CRITICAL RESULT CALLED TO, READ BACK BY AND VERIFIED WITH: CHRISTINE KATSOUDAS 01/09/16 1146 SGD    Methicillin resistance NOT DETECTED NOT DETECTED Final   Streptococcus species NOT DETECTED NOT DETECTED Final   Streptococcus agalactiae NOT DETECTED NOT DETECTED Final   Streptococcus pneumoniae NOT DETECTED NOT DETECTED Final   Streptococcus pyogenes NOT DETECTED NOT DETECTED Final   Acinetobacter baumannii NOT DETECTED NOT DETECTED Final   Enterobacteriaceae species NOT DETECTED NOT DETECTED Final   Enterobacter cloacae complex NOT DETECTED NOT DETECTED Final   Escherichia coli NOT DETECTED NOT DETECTED Final   Klebsiella oxytoca NOT DETECTED NOT DETECTED Final   Klebsiella pneumoniae NOT DETECTED NOT DETECTED Final   Proteus species NOT DETECTED NOT DETECTED Final   Serratia marcescens NOT DETECTED NOT DETECTED Final   Haemophilus influenzae NOT DETECTED NOT DETECTED Final   Neisseria meningitidis NOT DETECTED NOT DETECTED Final   Pseudomonas aeruginosa NOT DETECTED NOT DETECTED Final   Candida albicans NOT DETECTED NOT DETECTED Final   Candida glabrata NOT DETECTED NOT DETECTED Final   Candida krusei NOT DETECTED NOT DETECTED Final   Candida parapsilosis NOT DETECTED NOT DETECTED Final   Candida tropicalis NOT DETECTED NOT DETECTED Final  Aerobic/Anaerobic Culture (surgical/deep wound)     Status: None (Preliminary result)   Collection Time: 01/09/16 11:31 AM  Result Value Ref Range Status   Specimen Description SHOULDER  Final   Special Requests NONE  Final   Gram Stain   Final    ABUNDANT WBC PRESENT,BOTH PMN AND MONONUCLEAR ABUNDANT GRAM POSITIVE COCCI IN PAIRS    Culture   Final     ABUNDANT STAPHYLOCOCCUS AUREUS SUSCEPTIBILITIES TO FOLLOW Performed at  [facility]    Report Status PENDING  Incomplete  Culture, blood (single) w Reflex to ID Panel     Status: None (Preliminary result)   Collection Time: 01/09/16  4:16 PM  Result Value Ref Range Status   Specimen Description BLOOD LEFT ASSIST CONTROL  Final   Special Requests BOTTLES DRAWN AEROBIC AND ANAEROBIC [place]  Final   Culture NO GROWTH < 24 HOURS  Final   Report Status PENDING  Incomplete    RADIOLOGY:  No results found.   Management plans discussed with the patient, family and they are in agreement.  CODE STATUS:     Code Status Orders        Start     Ordered   01/08/16 1406  Do not attempt resuscitation (DNR)  Continuous    Question Answer Comment  In the event of cardiac or respiratory ARREST Do not call a "code blue"   In the event of cardiac or respiratory ARREST Do not perform Intubation, CPR, defibrillation or ACLS   In the event of cardiac or respiratory ARREST Use medication by any route, position, wound care, and other measures to relive pain and suffering. May use oxygen, suction and manual treatment of airway obstruction as needed for comfort.   Comments nurse may pronounce      01/08/16 1406    Code Status History    Date Active Date Inactive Code Status Order ID Comments User Context   12/28/2015  1:43 PM 12/31/2015  8:57 PM Full Code [id number]  Gladstone Lighter, MD Inpatient   12/10/2015  1:43 AM 12/12/2015  5:07 PM Full Code [id number]  Lance Coon, MD ED   12/07/2015  5:13 PM 12/08/2015  5:39 PM Full Code [id number]  Hillary Bow, MD Inpatient   11/15/2015 12:19 PM 11/16/2015  8:00 PM Full Code [id number]  Dustin Flock, MD Inpatient      TOTAL TIME TAKING CARE OF THIS PATIENT: 40 minutes.    Tifany Hirsch M.D on 01/10/2016 at 6:18 PM  Between 7am to 6pm - Pager - [phone] After 6pm go to [email or web address] - password [email or web address] [facility]   Office  [phone]  CC: Primary care physician; Kathrine Haddock, NP

## 2016-01-10 NOTE — Progress Notes (Signed)
Dr Azalee Course has d/w DR Sharen Counter gen surgery at [facility] who has again accepted pt for further care. Dr Azalee Course to d/w pt and wife and once they are in agreement and bed available pt will go to [facility]

## 2016-01-10 NOTE — Progress Notes (Signed)
Report called to Donney Dice, RN.  Patient is being admitted to Rm 7106, under the care of Dr. Sharen Counter.

## 2016-01-10 NOTE — Progress Notes (Signed)
[facility] [place] NAME: Kent [name]    MR#:  [id number]  DATE OF BIRTH:  1955/07/03  SUBJECTIVE:  Came in with fever and severe left shoulder pain and swelling with erythema and decreased ROM of the left shoulder Pain for 1 week POD #1 for necrotizing fasciitis Eating well. No fever  REVIEW OF SYSTEMS:   Review of Systems  Constitutional: Positive for fever. Negative for chills and weight loss.  HENT: Negative for ear discharge, ear pain and nosebleeds.   Eyes: Negative for blurred vision, pain and discharge.  Respiratory: Negative for sputum production, shortness of breath, wheezing and stridor.   Cardiovascular: Negative for chest pain, palpitations, orthopnea and PND.  Gastrointestinal: Negative for abdominal pain, diarrhea, nausea and vomiting.  Genitourinary: Negative for frequency and urgency.  Musculoskeletal: Negative for back pain and joint pain.  Neurological: Positive for weakness. Negative for sensory change, speech change and focal weakness.  Psychiatric/Behavioral: Negative for depression and hallucinations. The patient is not nervous/anxious.    Tolerating Diet:regular Tolerating ZC:HYIFOYDXAJ   DRUG ALLERGIES:  No Known Allergies  VITALS:  Blood pressure 110/71, pulse 90, temperature 98.3 F (36.8 C), temperature source Oral, resp. rate 18, height '5\' 7"'$  (1.702 m), weight 75 kg (165 lb 6.4 oz), SpO2 100 %.  PHYSICAL EXAMINATION:   Physical Exam  GENERAL:  60 y.o.-year-old patient lying in the bed with no acute distress. Appears weak and ill EYES: Pupils equal, round, reactive to light and accommodation. No scleral icterus. Extraocular muscles intact.  HEENT: Head atraumatic, normocephalic. Oropharynx and nasopharynx clear.  NECK:  Supple, no jugular venous distention. No thyroid enlargement, no tenderness.  LUNGS: Normal breath sounds bilaterally, no wheezing, rales, rhonchi. No use of accessory muscles of  respiration.  CARDIOVASCULAR: S1, S2 normal. No murmurs, rubs, or gallops.  ABDOMEN: Soft, nontender, nondistended. Bowel sounds present. No organomegaly or mass.  EXTREMITIES: No cyanosis, clubbing or edema b/l.   Left shoulder area wound vac+ Right 2nd toe gangrene+ NEUROLOGIC: Cranial nerves II through XII are intact. No focal Motor or sensory deficits b/l.   PSYCHIATRIC:  patient is alert and oriented x 3.  SKIN: No obvious rash, lesion, or ulcer.   LABORATORY PANEL:  CBC  Recent Labs Lab 01/09/16 0909  WBC 12.2*  HGB 7.4*  HCT 21.4*  PLT 153    Chemistries   Recent Labs Lab 01/07/16 1322  01/09/16 0909  NA 125*  < > 129*  K 3.3*  < > 3.0*  CL 80*  < > 93*  CO2 24  < > 26  GLUCOSE 156*  < > 117*  BUN 12  < > 14  CREATININE 0.65*  < > 0.59*  CALCIUM 7.9*  < > 7.6*  MG  --   < > 1.7  AST 77*  --   --   ALT 84*  --   --   ALKPHOS 244*  --   --   BILITOT 1.0  --   --   < > = values in this interval not displayed. Cardiac Enzymes No results for input(s): TROPONINI in the last 168 hours. RADIOLOGY:  Ct Shoulder Left W Contrast  Result Date: 01/08/2016 CLINICAL DATA:  Left shoulder pain and swelling for 1 week. EXAM: CT OF THE LEFT SHOULDER WITH CONTRAST TECHNIQUE: Multidetector CT imaging was performed following the standard protocol during bolus administration of intravenous contrast. CONTRAST:  100 cc Isovue-300 COMPARISON:  None. FINDINGS: Large collection  of fluid and air in the subcutaneous tissues overlying the left shoulder. This appears to be both superficial and also within the deltoid musculature. Some areas also noted in the left supraclavicular fossa. I do not see a discrete drainable soft tissue abscess. No findings to suggest septic arthritis involving the glenohumeral joint or AC joint. No findings for osteomyelitis. The visualized left lung is clear. IMPRESSION: Large collection of air and fluid in the superficial soft tissues around the shoulder. Findings  worrisome for gas-forming infection without discrete drainable abscess. Suspect small abscesses. No definite CT findings for septic arthritis or osteomyelitis. Electronically Signed   By: Marijo Sanes M.D.   On: 01/08/2016 19:53   ASSESSMENT AND PLAN:  1. Clinical sepsis with fever, tachycardia and leukocytosis.  -blood cultures negative MSSA - Source of the sepsis is left shoulder necrotizing fascitis -IV vancomycin,zosyn and added clinda--IV Zosyn --bp stable -wbc 19K---12K -patient is postop day 1 for necrotizing fasciitis. -Gen. Surgery feels patient would be served better in a tertiary care center given his long road to recovery requiring plastics and further debridement if needed. Surgery will discuss with patient and patient's wife to see if they're acceptable to transfer to tertiary care center  2. Hyponatremia, hypokalemia, hypomagnesemia.  -continue IV fluids replaced potassium -Low sodium could be pseudohyponatremia secondary to high sugars  3. Anemia of chronic disease. Stable  4. Essential hypertension on no blood pressure medication at this time  5. Type 2 diabetes mellitus not on any diabetic medication at this time -Given sugars rising in the setting of necrotizing fasciitis/infection and sepsis I will start patient on glipizide and also long-acting insulin with Lantus  6. 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 Follows with Dr. Nolon Stalls as outpatient.   D/w pt and wifein the room Case discussed with Care Management/Social Worker. Management plans discussed with the patient, family and they are in agreement.  CODE STATUS: FULL  DVT Prophylaxis: lovenonx  TOTAL critical TIME TAKING CARE OF THIS PATIENT:35* minutes.  >50% time spent on counselling and coordination of care with dr loflin  Awaiting transfer to tertiary care center  Note: This dictation was prepared with Dragon dictation along with smaller phrase  technology. Any transcriptional errors that result from this process are unintentional.  Eddi Hymes M.D on 01/10/2016 at 1:40 PM  Between 7am to 6pm - Pager - [phone]  After 6pm go to [email or web address] - password [email or web address] [facility]  Office  [phone]  CC: Primary care physician; Kathrine Haddock, NP

## 2016-01-10 NOTE — Progress Notes (Signed)
AVSS.  Minimal pain, some discomfort with abduction of the left shoulder Wound vac: Intact. No notable erythema. Discussed w/ Dr. Delana Meyer. Long discussion w/ family last night re: university assessment, as he will require plastics to close the wound even as it is today. If transfer not completed, would plan on Wound Vac change tomorrow in the OR.

## 2016-01-10 NOTE — Progress Notes (Signed)
CBG = 501. Discussed with Dr. Posey Pronto.  No serum glucose to be drawn.  Novolog 9 units given.   Will recheck CBG is 1 hour.

## 2016-01-10 NOTE — Progress Notes (Signed)
ID E note Afebrile. WBC down to 12.   Wound cx and bcx with Staph aureus.   Rec continue zosyn  Can dc clinda

## 2016-01-10 NOTE — Progress Notes (Signed)
  Oncology Nurse Navigator Documentation  Navigator Location: [facility] (01/10/16 1000)   )Navigator Encounter Type: Telephone (01/10/16 1000) Telephone: Incoming Call;Outgoing Call;Financial Assistance (01/10/16 1000)                       Barriers/Navigation Needs: Financial (01/10/16 1000)                          Time Spent with Patient: 30 (01/10/16 1000)  Patient's wife received call from [facility] states electricity being turned off today.  Spoke to [facility] representative.  Reported  Bill for 225.71 was submitted on 01/02/16 to [facility].  She states a note will be put in for no interrupton of service.

## 2016-01-11 LAB — CULTURE, BLOOD (ROUTINE X 2)

## 2016-01-11 SURGERY — IRRIGATION AND DEBRIDEMENT EXTREMITY
Anesthesia: Choice

## 2016-01-13 LAB — CULTURE, BLOOD (ROUTINE X 2): CULTURE: NO GROWTH

## 2016-01-14 ENCOUNTER — Telehealth: Payer: Self-pay

## 2016-01-14 LAB — CULTURE, BLOOD (SINGLE): Culture: NO GROWTH

## 2016-01-14 NOTE — Telephone Encounter (Signed)
Nutrition  Patient identified on Malnutrition Screening Report for positive weight loss and poor appetite.    Called patient's home phone and left message introducing self and service.  Asked if patient would be willing to schedule an appointment with nutrition either before or after radiation follow-up appointment on 11/27.   Asked patient to return call to [place] to set up appointment.    Aiva Miskell B. Zenia Resides, [place], [place] (pager)

## 2016-01-17 LAB — AEROBIC/ANAEROBIC CULTURE W GRAM STAIN (SURGICAL/DEEP WOUND)

## 2016-01-17 LAB — AEROBIC/ANAEROBIC CULTURE (SURGICAL/DEEP WOUND)

## 2016-01-23 ENCOUNTER — Telehealth: Payer: Self-pay | Admitting: *Deleted

## 2016-01-23 MED ORDER — NYSTATIN 100000 UNIT/ML MT SUSP: 5.0000 mL | Freq: Four times a day (QID) | OROMUCOSAL | 1 refills | Status: DC

## 2016-01-23 NOTE — Telephone Encounter (Signed)
Per Dr Rogue Bussing, if it is thrush, nystatin, if not MMW. I was unable to reach [facility] nurse so I called wife who states it looks like thrush

## 2016-01-23 NOTE — Telephone Encounter (Signed)
Reports that patient is on IV Abx TID and it has caused his mouth to be raw. Asking for something to help with this. Please advise

## 2016-01-27 NOTE — Progress Notes (Signed)
[place]  Chief Complaint: Kent [name] is a 60 y.o. 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 Mike Gip for electrolyte and fluid support.   HPI:   He was admitted to [facility] from 12/28/2015 - 12/31/2015.  He received IVF while hospitalized.  He was discovered to have dry gangrene of the right second toe.  He was seen by vascular medicine.  He underwent aortogram and right lower extremity angiogram on 12/31/2015.  Angiogram revealed good perfusion. Distal digit was felt likely to auto amputate.    Today he returns stating he had been doing well, eating and drinking well, denies any new problems, no fever, cough or chest pain.   Past Medical History:  Diagnosis Date  . Arthritis   . Diabetes mellitus without complication ([place])   . Hyperlipidemia   . Hypertension   . Obesity   . Stomach cancer ([place])    had tumor removed- [facility]  . Tongue cancer ([place])    Stage IV squamous cell carcinoma of the base of the tongue/left tonsil    Past Surgical History:  Procedure Laterality Date  . gastric tumor removed  2010  . I&D EXTREMITY Left 01/09/2016   Procedure: IRRIGATION AND DEBRIDEMENT EXTREMITY;  Surgeon: Robert Bellow, MD;  Location: [facility] ORS;  Service: General;  Laterality: Left;  . PERIPHERAL VASCULAR CATHETERIZATION Right 12/31/2015   Procedure: Lower Extremity Angiography;  Surgeon: Algernon Huxley, MD;  Location: [place] [facility];  Service: Cardiovascular;  Laterality: Right;  . tongue mass biopsy      Family History  Problem Relation Age of Onset  . Heart disease Mother   . Hypertension Father   . Cancer Sister     stomach  . Heart disease Brother     MI  . Cancer Daughter   . Cancer Son     testicular  . ADD / ADHD Sister     Social History:  reports that he has never smoked. He has never used smokeless tobacco. He reports that he does not  drink alcohol or use drugs.  He smokes a pipe and cigar rarely (once every 3 months).  He has a son and daughter.  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  He has planned (and paid for) a trip to the beach from 10/15/2015 - 10/20/2015.  He  has no short term disability.  He has 10 weeks of vacation and sick leave.   Allergies: No Known Allergies  Current Medications: Current Outpatient Prescriptions  Medication Sig Dispense Refill  . feeding supplement (BOOST / RESOURCE BREEZE) LIQD Take 1 Container by mouth 3 (three) times daily between meals. 20000 mL 2  . lidocaine (XYLOCAINE) 2 % solution Use as directed 15 mLs in the mouth or throat every 6 (six) hours as needed for mouth pain. 100 mL 0  . LORazepam (ATIVAN) 0.5 MG tablet Take 1 tablet (0.5 mg total) by mouth every 6 (six) hours as needed (Nausea or vomiting). (Patient taking differently: Take 0.5 mg by mouth every 6 (six) hours as needed for anxiety. ) 30 tablet 0  . magic mouthwash SOLN Take 5 mLs by mouth 4 (four) times daily as needed for mouth pain. 480 mL 1  . meloxicam (MOBIC) 15 MG tablet Take 1 tablet (15 mg total) by mouth daily. (Patient taking differently: Take 15 mg  by mouth daily as needed for pain. ) 30 tablet 0  . ondansetron (ZOFRAN) 8 MG tablet Take 1 tablet (8 mg total) by mouth 2 (two) times daily as needed. Start on the third day after chemotherapy. 30 tablet 1  . oxyCODONE-acetaminophen (PERCOCET/ROXICET) 5-325 MG tablet Take 1 tablet by mouth every 6 (six) hours as needed for severe pain. 15 tablet 0  . potassium chloride (KLOR-CON) 20 MEQ packet Take 40 mEq by mouth 2 (two) times daily. 60 packet 0  . aspirin EC 81 MG tablet Take 81 mg by mouth daily as needed for moderate pain.    Marland Kitchen glipiZIDE (GLUCOTROL) 5 MG tablet Take 1 tablet (5 mg total) by mouth 2 (two) times daily before a meal. 60 tablet 0  . insulin glargine (LANTUS) 100 UNIT/ML injection Inject 0.2 mLs (20 Units total) into the skin  daily. 10 mL 11  . nystatin (MYCOSTATIN) 100000 UNIT/ML suspension Take 5 mLs (500,000 Units total) by mouth 4 (four) times daily. 120 mL 1  . piperacillin-tazobactam (ZOSYN) 3.375 (3-0.375) g injection Inject 3,375 mg (3.375 g total) into the vein every 6 (six) hours. 1 each 0   No current facility-administered medications for this visit.    Physical Exam: Blood pressure (!) 132/95, pulse (!) 147, temperature (!) 100.5 F (38.1 C), temperature source Tympanic, resp. rate 18, weight 163 lb 12.8 oz (74.3 kg).  No thrush, no apparent distress.  Clinical Support on 01/04/2016  Component Date Value Ref Range Status  . Sodium 01/04/2016 126* 135 - 145 mmol/L Final  . Potassium 01/04/2016 3.9  3.5 - 5.1 mmol/L Final  . Chloride 01/04/2016 91* 101 - 111 mmol/L Final  . CO2 01/04/2016 25  22 - 32 mmol/L Final  . Glucose, Bld 01/04/2016 167* 65 - 99 mg/dL Final  . BUN 01/04/2016 15  6 - 20 mg/dL Final  . Creatinine, Ser 01/04/2016 0.57* 0.61 - 1.24 mg/dL Final  . Calcium 01/04/2016 8.3* 8.9 - 10.3 mg/dL Final  . Total Protein 01/04/2016 5.9* 6.5 - 8.1 g/dL Final  . Albumin 01/04/2016 2.6* 3.5 - 5.0 g/dL Final  . AST 01/04/2016 24  15 - 41 U/L Final  . ALT 01/04/2016 21  17 - 63 U/L Final  . Alkaline Phosphatase 01/04/2016 99  38 - 126 U/L Final  . Total Bilirubin 01/04/2016 0.7  0.3 - 1.2 mg/dL Final  . GFR calc non Af Amer 01/04/2016 >60  >60 mL/min Final  . GFR calc Af Amer 01/04/2016 >60  >60 mL/min Final   Comment: (NOTE) The eGFR has been calculated using the CKD EPI equation. This calculation has not been validated in all clinical situations. eGFR's persistently <60 mL/min signify possible Chronic Kidney Disease.   . Anion gap 01/04/2016 10  5 - 15 Final  . Magnesium 01/04/2016 1.5* 1.7 - 2.4 mg/dL Final  Infusion on 01/03/2016  Component Date Value Ref Range Status  . Sodium 01/03/2016 125* 135 - 145 mmol/L Final  . Potassium 01/03/2016 4.0  3.5 - 5.1 mmol/L Final  . Chloride  01/03/2016 91* 101 - 111 mmol/L Final  . CO2 01/03/2016 27  22 - 32 mmol/L Final  . Glucose, Bld 01/03/2016 165* 65 - 99 mg/dL Final  . BUN 01/03/2016 10  6 - 20 mg/dL Final  . Creatinine, Ser 01/03/2016 0.65  0.61 - 1.24 mg/dL Final  . Calcium 01/03/2016 8.1* 8.9 - 10.3 mg/dL Final  . GFR calc non Af Amer 01/03/2016 >60  >60 mL/min Final  .  GFR calc Af Amer 01/03/2016 >60  >60 mL/min Final   Comment: (NOTE) The eGFR has been calculated using the CKD EPI equation. This calculation has not been validated in all clinical situations. eGFR's persistently <60 mL/min signify possible Chronic Kidney Disease.   . Anion gap 01/03/2016 7  5 - 15 Final  . Magnesium 01/03/2016 1.3* 1.7 - 2.4 mg/dL Final  Appointment on 01/02/2016  Component Date Value Ref Range Status  . Sodium 01/02/2016 127* 135 - 145 mmol/L Final  . Potassium 01/02/2016 4.0  3.5 - 5.1 mmol/L Final  . Chloride 01/02/2016 94* 101 - 111 mmol/L Final  . CO2 01/02/2016 24  22 - 32 mmol/L Final  . Glucose, Bld 01/02/2016 184* 65 - 99 mg/dL Final  . BUN 01/02/2016 12  6 - 20 mg/dL Final  . Creatinine, Ser 01/02/2016 0.80  0.61 - 1.24 mg/dL Final  . Calcium 01/02/2016 8.3* 8.9 - 10.3 mg/dL Final  . GFR calc non Af Amer 01/02/2016 >60  >60 mL/min Final  . GFR calc Af Amer 01/02/2016 >60  >60 mL/min Final   Comment: (NOTE) The eGFR has been calculated using the CKD EPI equation. This calculation has not been validated in all clinical situations. eGFR's persistently <60 mL/min signify possible Chronic Kidney Disease.   . Anion gap 01/02/2016 9  5 - 15 Final  . Magnesium 01/02/2016 1.6* 1.7 - 2.4 mg/dL Final    Assessment:  Kent [name] is a 60 y.o. 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  Tumor was p16 IHC positive (high risk HPV).  He has persistent hyponatremia and hypomagnesemia, I wonder if hyponatremia is tumor related ADH. He remains tachycardic, without any  chest pain or dizzy spells, he states he is driking fluids an denies any new problems today except for feeling tired and mild left shoulderpain which he attributes to falling asleep on the left side. The dry gangrene remains dry with no discharge. His mag is 1.5. Sodium remains at 126  A stat EKG showed sinus tachycardia,  I ordered fluids and mag replacement today, I wanted to reassess his tachycardia at the end of fluids, but he left and did not want to wait.  I recommended admission to hospital again, since I felt he is high risk for arrthtmias given the persistent electrolyte abnormalites, he however refused to be admitted. We will have him return again on Monday for labs and possible fluids.  Creola Corn, MD  01/27/2016, 12:20 PM

## 2016-01-28 ENCOUNTER — Encounter: Payer: Self-pay | Admitting: Radiation Oncology

## 2016-01-28 ENCOUNTER — Other Ambulatory Visit: Payer: Self-pay | Admitting: *Deleted

## 2016-01-28 ENCOUNTER — Ambulatory Visit
Admission: RE | Admit: 2016-01-28 | Discharge: 2016-01-28 | Disposition: A | Discharge status: Home / Self Care | Payer: Managed Care, Other (non HMO) | Source: Ambulatory Visit | Attending: Radiation Oncology | Admitting: Radiation Oncology

## 2016-01-28 ENCOUNTER — Inpatient Hospital Stay: Payer: Managed Care, Other (non HMO)

## 2016-01-28 VITALS — BP 122/88 | HR 101 | Temp 98.2°F | Wt 163.1 lb

## 2016-01-28 DIAGNOSIS — B370 Candidal stomatitis: Secondary | ICD-10-CM | POA: Diagnosis not present

## 2016-01-28 DIAGNOSIS — C01 Malignant neoplasm of base of tongue: Secondary | ICD-10-CM | POA: Insufficient documentation

## 2016-01-28 DIAGNOSIS — Z9221 Personal history of antineoplastic chemotherapy: Secondary | ICD-10-CM | POA: Insufficient documentation

## 2016-01-28 DIAGNOSIS — M726 Necrotizing fasciitis: Secondary | ICD-10-CM | POA: Insufficient documentation

## 2016-01-28 DIAGNOSIS — Z923 Personal history of irradiation: Secondary | ICD-10-CM | POA: Diagnosis not present

## 2016-01-28 DIAGNOSIS — R1310 Dysphagia, unspecified: Secondary | ICD-10-CM | POA: Insufficient documentation

## 2016-01-28 DIAGNOSIS — C07 Malignant neoplasm of parotid gland: Secondary | ICD-10-CM

## 2016-01-28 MED ORDER — FLUCONAZOLE 100 MG PO TABS: 100.0000 mg | ORAL_TABLET | Freq: Every day | ORAL | 0 refills | Status: DC

## 2016-01-28 NOTE — Progress Notes (Signed)
Radiation Oncology Follow up Note  Name: Kent [name]   Date:   01/28/2016 MRN:  [id number] DOB: 11-21-1955    This 60 y.o. male presents to the clinic today for one-month follow-up status post chemoradiation for stage IVb (T2 N3 M0) squamous cell carcinoma the base of tongue.Marland Kitchen  REFERRING PROVIDER: Kathrine Haddock, NP  HPI: Patient is a 60 year old male now out 1 month having completed combined modality treatment for stage IV B squamous cell carcinoma the base of tongue. He is had hospitalization both at [facility] [facility] for necrotizing fasciitis of the left shoulder unrelated to his prior radiation which was not treated in that area. He's also been having significant difficulty with dysphagia. He is active oral candidiasis. Is on multiple antibiotic therapy still from [facility] although can medication without hospital according to the patient and his wife has been poor.  COMPLICATIONS OF TREATMENT: none  FOLLOW UP COMPLIANCE: keeps appointments   PHYSICAL EXAM:  BP 122/88   Pulse (!) 101   Temp 98.2 F (36.8 C)   Wt 163 lb 2.3 oz (74 kg)   BMI 25.55 kg/m  Oral cavity shows oral candidiasis. Indirect mirror examination shows base of tongue within normal limits upper airway clear vallecula also clear. Massive adenopathy in the left neck is completely resolved. No evidence of subject gastric cervical or supraclavicular adenopathy is identified. Well-developed well-nourished patient in NAD. HEENT reveals PERLA, EOMI, discs not visualized.  Oral cavity is clear. No oral mucosal lesions are identified. Neck is clear without evidence of cervical or supraclavicular adenopathy. Lungs are clear to A&P. Cardiac examination is essentially unremarkable with regular rate and rhythm without murmur rub or thrill. Abdomen is benign with no organomegaly or masses noted. Motor sensory and DTR levels are equal and symmetric in the upper and lower extremities. Cranial nerves II through XII are grossly  intact. Proprioception is intact. No peripheral adenopathy or edema is identified. No motor or sensory levels are noted. Crude visual fields are within normal range.  RADIOLOGY RESULTS: Recent CT scan of the left shoulder shows air and fluid in the superficial 12 soft tissues of the left shoulder compatible with gas-forming infection.  PLAN: At this time from head and neck standpoint he is recovering well although he is still weak especially from his recent surgeries including debridement of his left shoulder for necrotizing fasciitis. I'm starting him on Diflucan for 10 days for oral candidiasis. I've asked the patient's wife to contact [facility] about further instructions about his antibiotic therapy. I've otherwise asked to see him back in 4 months for follow-up and given PET CT is not performed prior will obtain that for evaluation.  I would like to take this opportunity to thank you for allowing me to participate in the care of your patient.Armstead Peaks., MD

## 2016-01-28 NOTE — Progress Notes (Signed)
Nutrition Assessment   Reason for Assessment:  Patient identified on Malnutrition Screening Report for poor appetite and weight loss  ASSESSMENT:  60 year old male with squamous cell carcinoma of the base of tongue/left tonsil cancer.  Patient reports that he is finished with chemo and radiation therapy.  During treatment noted received frequent IV fluids in clinic.    Past medical history of DM, HLD, HTN, stomach cancer  Wife with patient this am.  Reports that IV antibiotics that [facility] prescribed is causing nausea, vomiting.  Reports that he has thrush that MD Crystal has prescribed medication for today. Patient reports that he is eating different stuff.  Hard to identify exact foods he is eating during the day. Did report that he ate a little bit of chicken and mashed potatoes last night for dinner.  Does not like ensure/boost supplements.     Medications: magic mouthwash, magox, zofran, KCL, carafate  Labs: reviewed  Anthropometrics:   Height: 67 inches Weight: 163 lb 2.3 oz UBW: 230 lb about 1 year ago per wife BMI: 25  27% weight loss in the last year, which is significant  Estimated Energy Needs  Kcals: 2200-2500 calories Protein: 89-111 g/d Fluid: 2.5 L/d   NUTRITION DIAGNOSIS: Inadequate oral intake related to cancer and cancer related treatments as evidenced by significant weight loss of 27% in the last year.   MALNUTRITION DIAGNOSIS: Patient meets criteria for severe malnutrition in the context of chronic illness  secondary to significant weight loss of 27% and intake of less than or equal to 75% of estimated energy requirement for > or equal to 1 month.     INTERVENTION:   Discussed high calorie, high protein ways to modified foods with patient and wife, handout given along with recipes.   Discussed easy to chew and swallow foods and strategies for eating with nausea and vomiting, handout given.  Teach back method used.     MONITORING, EVALUATION, GOAL:  Patient will consume high calorie, high protein foods to prevent further weight loss   NEXT VISIT:  As needed  Derl Abalos B. Zenia Resides, [place], [place] (pager)

## 2016-02-06 ENCOUNTER — Ambulatory Visit: Payer: Managed Care, Other (non HMO) | Admitting: Unknown Physician Specialty

## 2016-02-11 ENCOUNTER — Ambulatory Visit (INDEPENDENT_AMBULATORY_CARE_PROVIDER_SITE_OTHER): Payer: Managed Care, Other (non HMO) | Admitting: Family Medicine

## 2016-02-11 ENCOUNTER — Encounter: Payer: Self-pay | Admitting: Family Medicine

## 2016-02-11 VITALS — BP 138/93 | HR 80 | Temp 98.3°F | Wt 165.0 lb

## 2016-02-11 DIAGNOSIS — H9203 Otalgia, bilateral: Secondary | ICD-10-CM | POA: Diagnosis not present

## 2016-02-11 MED ORDER — CETIRIZINE HCL 10 MG PO TABS: 10.0000 mg | ORAL_TABLET | Freq: Every day | ORAL | 11 refills | Status: DC

## 2016-02-11 MED ORDER — NEOMYCIN-POLYMYXIN-HC 3.5-10000-1 OT SOLN: 4.0000 [drp] | Freq: Four times a day (QID) | OTIC | 0 refills | Status: DC

## 2016-02-11 MED ORDER — FLUTICASONE PROPIONATE 50 MCG/ACT NA SUSP: 2.0000 | Freq: Every day | NASAL | 6 refills | Status: DC

## 2016-02-11 NOTE — Progress Notes (Signed)
   BP (!) 138/93   Pulse 80   Temp 98.3 F (36.8 C)   Wt 165 lb (74.8 kg)   SpO2 100%   BMI 25.84 kg/m    Subjective:    Patient ID: Kent [name], male    DOB: [birth year], 60 y.o.   MRN: [id number]  HPI: Kent [name] is a 60 y.o. male  Chief Complaint  Patient presents with  . Ear Pain    x 2 weeks. bilateral ear discomfort and burning, feels like it's draining and burning into his throat   Patient presents with 2 weeks of b/l ear discomfort and burning pain. States he's been having a lot of drainage down throat rhinorrhea with it. No hearing change, fever, chills, cough. Has not been trying anything OTC. Denies hx of allergies. Just finished head/neck radiation for tongue cancer about 2 months ago, wife wondering if it's related. Has not discussed with his rad oncologist at this time.   Relevant past medical, surgical, family and social history reviewed and updated as indicated. Interim medical history since our last visit reviewed. Allergies and medications reviewed and updated.  Review of Systems  Constitutional: Negative.   HENT: Positive for ear pain, postnasal drip, rhinorrhea and sore throat. Negative for ear discharge.   Respiratory: Negative.   Cardiovascular: Negative.   Gastrointestinal: Negative.   Genitourinary: Negative.   Musculoskeletal: Negative.   Skin: Negative.   Neurological: Negative.   Psychiatric/Behavioral: Negative.     Per HPI unless specifically indicated above     Objective:    BP (!) 138/93   Pulse 80   Temp 98.3 F (36.8 C)   Wt 165 lb (74.8 kg)   SpO2 100%   BMI 25.84 kg/m   Wt Readings from Last 3 Encounters:  02/11/16 165 lb (74.8 kg)  01/28/16 163 lb 2.3 oz (74 kg)  01/08/16 165 lb 6.4 oz (75 kg)    Physical Exam  Constitutional: He is oriented to person, place, and time. He appears well-developed and well-nourished. No distress.  HENT:  Head: Atraumatic.  Oropharynx erythematous centrally Dry, flaky skin with moderate  amount of cerumen b/l EACs TMs benign b/l, minimal if any effusion  Eyes: Conjunctivae are normal. Pupils are equal, round, and reactive to light. No scleral icterus.  Neck: Normal range of motion. Neck supple. No thyromegaly present.  Cardiovascular: Normal rate and normal heart sounds.   Pulmonary/Chest: Effort normal. No respiratory distress.  Musculoskeletal: Normal range of motion.  Lymphadenopathy:    He has no cervical adenopathy.  Neurological: He is alert and oriented to person, place, and time.  Skin: Skin is warm and dry.  Psychiatric: He has a normal mood and affect. His behavior is normal.  Nursing note and vitals reviewed.     Assessment & Plan:   Problem List Items Addressed This Visit    None    Visit Diagnoses    Ear pain, bilateral    -  Primary   Unclear etiology, will send cortisporin drops, flonase, and zyrtec to see if any relief. Recent hx of head/neck radiation, follow up with onc as well about sxs       Follow up plan: Return if symptoms worsen or fail to improve.

## 2016-02-11 NOTE — Patient Instructions (Signed)
Follow up as needed

## 2016-03-13 ENCOUNTER — Telehealth: Payer: Self-pay

## 2016-03-13 NOTE — Telephone Encounter (Signed)
Nutrition Follow-up:  Attempted to call patient at home and mobile number listed in system and number has been changed, disconnected or no longer in service.    Patient has completed chemo and radiation treatment for squamous cell carcinoma of the base of the tongue/left tonsil cancer.    Chart reviewed.  Noted weight at visit on 01/28/2016 of 163 lb, weight taken on 02/11/2016 165 lb.    Will follow-up as able.  On clinic visit on 01/28/2016 patient and wife aware of nutrition services and encouraged to call with questions or schedule an appointment with RD.  Rhealynn Myhre B. Zenia Resides, [place], [place] (pager)

## 2016-03-25 ENCOUNTER — Encounter: Payer: Self-pay | Admitting: *Deleted

## 2016-04-07 ENCOUNTER — Inpatient Hospital Stay: Payer: Managed Care, Other (non HMO) | Admitting: Hematology and Oncology

## 2016-04-07 NOTE — Progress Notes (Deleted)
[facility] day:  04/07/16  Chief Complaint: Roddie Riegler Heath is a 61 y.o. 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  HPI:  The patient was last seen by in the medical oncology clinic on 01/08/2016.  At that time, he was day 35 of cycle #3 cisplatin and concurrent radiation.  He had a new tender erythematous and edematous left shoulder worrisome for cellulitis/septic arthritis.  WBC was elevated.  He had dry gangrene of the second right toe which appeared slightly worse.  He was admitted to [facility] from 01/08/2016 - 01/10/2016.  his oral intake remained poor.  Exam revealed improving left neck adenopathy and mucositis post radiation.  He had dry gangrene of the right second toe.  He had hyponatremia (125) and hypomagnesemia (1.2).  He received 1 liter of fluids with 3 gm of magnesium.  He was scheduled for daily labs + fluids.  He was encouraged to eat and drink.  Request was made for a daily liquid and food diary.  He was seen by Dr. Sherrine Maples on 01/04/2016 prior to the weekend.  Sodium was 126, potassium 3.9, and magnesium 1.5.  He received fluids in clinic.  He missed his appointment yesterday.  Symptomatically, he feels weak and tired. He notes that for two days he can eat "okay" then 2 days he can't.  He denies any oral pain.  He denies any nausea or vomiting.    He has had 2 falls in the recent past (one in the backyard and one in the tub).  He denies hitting his shoulder. He has felt a little dizzy. He notes that his left shoulder is sore and warm. He is unclear of when he began to have shoulder discomfort within the past week.   Past Medical History:  Diagnosis Date  . Arthritis   . Diabetes mellitus without complication ([place])   . Hyperlipidemia   . Hypertension   . Obesity   . 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 Tongue cancer ([place])    Stage IV squamous cell carcinoma of the base of the tongue/left tonsil    Past Surgical History:  Procedure Laterality Date  . gastric tumor removed  2010  . I&D EXTREMITY Left 01/09/2016   Procedure: IRRIGATION AND DEBRIDEMENT EXTREMITY;  Surgeon: Robert Bellow, MD;  Location: [facility] ORS;  Service: General;  Laterality: Left;  . PERIPHERAL VASCULAR CATHETERIZATION Right 12/31/2015   Procedure: Lower Extremity Angiography;  Surgeon: Algernon Huxley, MD;  Location: [facility];  Service: Cardiovascular;  Laterality: Right;  . tongue mass biopsy      Family History  Problem Relation Age of Onset  . Heart disease Mother   . Hypertension Father   . Cancer Sister     stomach  . Heart disease Brother     MI  . Cancer Daughter   . Cancer Son     testicular  . ADD / ADHD Sister     Social History:  reports that he has never smoked. He has never used smokeless tobacco. He reports that he does not drink alcohol or use drugs.  He smokes a pipe and cigar rarely (once every 3 months).  He has a son and daughter.  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  He is accompanied by his wife today.  Allergies: No Known Allergies  Current Medications: Current Outpatient Prescriptions  Medication Sig Dispense Refill  . aspirin EC 81 MG tablet Take 81 mg by mouth daily as needed for moderate pain.    . bacitracin 500 UNIT/GM ointment Apply topically.    . cetirizine (ZYRTEC) 10 MG tablet Take 1 tablet (10 mg total) by mouth daily. 30 tablet 11  . feeding supplement (BOOST / RESOURCE BREEZE) LIQD Take 1 Container by mouth 3 (three) times daily between meals. 20000 mL 2  . fluticasone (FLONASE) 50 MCG/ACT nasal spray Place 2 sprays into both nostrils daily. 16 g 6  . insulin glargine (LANTUS) 100 UNIT/ML injection Inject 0.2 mLs (20 Units total) into the skin daily. 10 mL 11  . lidocaine (XYLOCAINE) 2 % solution Use as directed 15 mLs in  the mouth or throat every 6 (six) hours as needed for mouth pain. 100 mL 0  . lisinopril (PRINIVIL,ZESTRIL) 10 MG tablet Take 10 mg by mouth.    . magic mouthwash SOLN Take 5 mLs by mouth 4 (four) times daily as needed for mouth pain. 480 mL 1  . metFORMIN (GLUCOPHAGE) 500 MG tablet Take 500 mg by mouth daily with breakfast.    . neomycin-polymyxin-hydrocortisone (CORTISPORIN) otic solution Place 4 drops into the left ear 4 (four) times daily. 10 mL 0  . piperacillin-tazobactam (ZOSYN) 3.375 (3-0.375) g injection Inject 3,375 mg (3.375 g total) into the vein every 6 (six) hours. 1 each 0  . potassium chloride (KLOR-CON) 20 MEQ packet Take 40 mEq by mouth 2 (two) times daily. 60 packet 0   No current facility-administered medications for this visit.     Review of Systems:  GENERAL:  Weak and tired.  Fever in clinic.  Weight down 20 pounds since 12/04/2015. PERFORMANCE STATUS (ECOG):  1-2 HEENT:  Thick oral secretions.  No pain with swallowing.  No runny nose or mouth sores. Lungs:  No shortness of breath or cough.  No hemoptysis. Cardiac:  No chest pain, palpitations, orthopnea, or PND.  GI:  Poor oral intake (varies from day to day).  No nausea, vomiting, diarrhea, constipation, melena or hematochezia.  GU:  No urgency, frequency, dysuria, or hematuria.  Musculoskeletal:  Left shoulder "sore".  No muscle tenderness. Extremities:  Right second toe gangrene, worse per wife.  No pain. Skin:  No rashes or skin changes. Neuro:  No headache, numbness or weakness, balance or coordination issues. Endocrine:  Diabetes.  No thyroid issues, hot flashes or night sweats. Psych:  No mood changes, depression or anxiety. Pain:  No throat pain.  Left shoulder sore, Review of systems:  All other systems reviewed and found to be negative.  Physical Exam: There were no vitals taken for this visit. GENERAL:  Chronically ill appearing gentleman sitting comfortably in the exam room in no acute  distress. MENTAL STATUS:  Alert and oriented to person, place and time. HEAD:  Slightly disheveled.  Short gray hair.  Goatee.  Normocephalic, atraumatic, face symmetric, no Cushingoid features. EYES:  Blue eyes.  Pupils equal round and reactive to light and accomodation.  No conjunctivitis or scleral icterus. ENT:  Oropharynx normal except for thin yellow secretions in posterior pharynx (dramtically improved).  No thrush.  Tongue normal.  NECK:  Hyperpigmentation left neck with decreased adenopathy s/p radiation.   RESPIRATORY:  Clear to auscultation without rales, wheezes or rhonchi. CARDIOVASCULAR:  Tachycardia.  Irregularly irregular.  No murmur, rub or gallop. ABDOMEN:  Soft, non-tender, with active bowel sounds, and no hepatosplenomegaly.  No masses. SKIN:  No rashes, ulcers or lesions. EXTREMITIES:  Upper extremity PICC line.  Unremarkable exit site.  No tenderness or erythema.  Tip of right second toe black with slight extension (dry) with base of erythema.  Left shoulder with decreased range of motion.  Marked erythema, induration and warmth.   Dusky gray oval area on left upper chest medial to shoulder. LYMPH NODES: Soft left neck adenopathy (2 cm).  No palpable supraclavicular, axillary or inguinal adenopathy  NEUROLOGICAL: Unremarkable. PSYCH:  Appropriate.     No visits with results within 3 Day(s) from this visit.  Latest known visit with results is:  Admission on 01/08/2016, Discharged on 01/10/2016  Component Date Value Ref Range Status  . Specimen Description 01/08/2016 BLOOD LEFT ASSIST CONTROL   Final  . Special Requests 01/08/2016 BOTTLES DRAWN AEROBIC AND ANAEROBIC [id number]   Final  . Culture 01/08/2016 NO GROWTH 5 DAYS   Final  . Report Status 01/08/2016 01/13/2016 FINAL   Final  . Specimen Description 01/08/2016 BLOOD RIGHT ASSIST CONTROL   Final  . Special Requests 01/08/2016 BOTTLES DRAWN AEROBIC AND ANAEROBIC [place]   Final  . Culture  Setup Time  01/08/2016    Final                   Value:GRAM POSITIVE COCCI ANAEROBIC BOTTLE ONLY CRITICAL RESULT CALLED TO, READ BACK BY AND VERIFIED WITH: CHRISTINE KATSOUDAS 01/09/16 1146 SGD Performed at [facility]   . Culture 01/08/2016 STAPHYLOCOCCUS AUREUS*  Final  . Report Status 01/08/2016 01/11/2016 FINAL   Final  . Organism ID, Bacteria 01/08/2016 STAPHYLOCOCCUS AUREUS   Final  . Sed Rate 01/08/2016 127* 0 - 20 mm/hr Final  . Weight 01/09/2016 2646.4  oz Final  . Height 01/09/2016 67  in Final  . BP 01/09/2016 128/76  mmHg Final  . Ferritin 01/08/2016 1927* 24 - 336 ng/mL Final  . Sodium 01/09/2016 129* 135 - 145 mmol/L Final  . Potassium 01/09/2016 3.0* 3.5 - 5.1 mmol/L Final  . Chloride 01/09/2016 93* 101 - 111 mmol/L Final  . CO2 01/09/2016 26  22 - 32 mmol/L Final  . Glucose, Bld 01/09/2016 117* 65 - 99 mg/dL Final  . BUN 01/09/2016 14  6 - 20 mg/dL Final  . Creatinine, Ser 01/09/2016 0.59* 0.61 - 1.24 mg/dL Final  . Calcium 01/09/2016 7.6* 8.9 - 10.3 mg/dL Final  . GFR calc non Af Amer 01/09/2016 >60  >60 mL/min Final  . GFR calc Af Amer 01/09/2016 >60  >60 mL/min Final   Comment: (NOTE) The eGFR has been calculated using the CKD EPI equation. This calculation has not been validated in all clinical situations. eGFR's persistently <60 mL/min signify possible Chronic Kidney Disease.   . Anion gap 01/09/2016 10  5 - 15 Final  . WBC 01/09/2016 12.2* 3.8 - 10.6 K/uL Final  . RBC 01/09/2016 2.52* 4.40 - 5.90 MIL/uL Final  . Hemoglobin 01/09/2016 7.4* 13.0 - 18.0 g/dL Final  . HCT 01/09/2016 21.4* 40.0 - 52.0 % Final  . MCV 01/09/2016 84.7  80.0 - 100.0 fL Final  . MCH 01/09/2016 29.2  26.0 - 34.0 pg Final  . MCHC 01/09/2016 34.5  32.0 - 36.0 g/dL Final  . RDW 01/09/2016 17.5* 11.5 - 14.5 % Final  . Platelets 01/09/2016 153  150 - 440 K/uL Final  . Magnesium 01/09/2016 1.7  1.7 - 2.4 mg/dL Final  . Glucose-Capillary 01/08/2016  209* 65 - 99 mg/dL Final  .  Glucose-Capillary 01/09/2016 115* 65 - 99 mg/dL Final  . Enterococcus species 01/08/2016 NOT DETECTED  NOT DETECTED Final  . Listeria monocytogenes 01/08/2016 NOT DETECTED  NOT DETECTED Final  . Staphylococcus species 01/08/2016 DETECTED* NOT DETECTED Final   Comment: CRITICAL RESULT CALLED TO, READ BACK BY AND VERIFIED WITH: CHRISTINE KATSOUDAS 01/09/16 1146 SGD   . Staphylococcus aureus 01/08/2016 DETECTED* NOT DETECTED Final   Comment: CRITICAL RESULT CALLED TO, READ BACK BY AND VERIFIED WITH: CHRISTINE KATSOUDAS 01/09/16 1146 SGD   . Methicillin resistance 01/08/2016 NOT DETECTED  NOT DETECTED Final  . Streptococcus species 01/08/2016 NOT DETECTED  NOT DETECTED Final  . Streptococcus agalactiae 01/08/2016 NOT DETECTED  NOT DETECTED Final  . Streptococcus pneumoniae 01/08/2016 NOT DETECTED  NOT DETECTED Final  . Streptococcus pyogenes 01/08/2016 NOT DETECTED  NOT DETECTED Final  . Acinetobacter baumannii 01/08/2016 NOT DETECTED  NOT DETECTED Final  . Enterobacteriaceae species 01/08/2016 NOT DETECTED  NOT DETECTED Final  . Enterobacter cloacae complex 01/08/2016 NOT DETECTED  NOT DETECTED Final  . Escherichia coli 01/08/2016 NOT DETECTED  NOT DETECTED Final  . Klebsiella oxytoca 01/08/2016 NOT DETECTED  NOT DETECTED Final  . Klebsiella pneumoniae 01/08/2016 NOT DETECTED  NOT DETECTED Final  . Proteus species 01/08/2016 NOT DETECTED  NOT DETECTED Final  . Serratia marcescens 01/08/2016 NOT DETECTED  NOT DETECTED Final  . Haemophilus influenzae 01/08/2016 NOT DETECTED  NOT DETECTED Final  . Neisseria meningitidis 01/08/2016 NOT DETECTED  NOT DETECTED Final  . Pseudomonas aeruginosa 01/08/2016 NOT DETECTED  NOT DETECTED Final  . Candida albicans 01/08/2016 NOT DETECTED  NOT DETECTED Final  . Candida glabrata 01/08/2016 NOT DETECTED  NOT DETECTED Final  . Candida krusei 01/08/2016 NOT DETECTED  NOT DETECTED Final  . Candida parapsilosis 01/08/2016 NOT DETECTED  NOT DETECTED Final  .  Candida tropicalis 01/08/2016 NOT DETECTED  NOT DETECTED Final  . Specimen Description 01/09/2016 SHOULDER   Final  . Special Requests 01/09/2016 NONE   Final  . Gram Stain 01/09/2016    Final                   Value:ABUNDANT WBC PRESENT,BOTH PMN AND MONONUCLEAR ABUNDANT GRAM POSITIVE COCCI IN PAIRS   . Culture 01/09/2016    Final                   Value:ABUNDANT STAPHYLOCOCCUS AUREUS NO ANAEROBES ISOLATED Performed at [facility]   . Report Status 01/09/2016 01/17/2016 FINAL   Final  . Organism ID, Bacteria 01/09/2016 STAPHYLOCOCCUS AUREUS   Final  . SURGICAL PATHOLOGY 01/09/2016    Final                   Value:Surgical Pathology CASE: [id number] PATIENT: Kahli Trillo Surgical Pathology Report     SPECIMEN SUBMITTED: A. Debridement, left shoulder  CLINICAL HISTORY: None provided  PRE-OPERATIVE DIAGNOSIS: Necrotizing fasciitis  POST-OPERATIVE DIAGNOSIS: Same as pre-op     DIAGNOSIS: A. SKIN, LEFT SHOULDER; DEBRIDEMENT: - CHANGES CONSISTENT WITH NECROTIZING FASCIITIS.   GROSS DESCRIPTION:  A. Labeled: left shoulder debridement  Tissue fragment(s): 5  Size: aggregate, 10.1 x 8.6 x 1.6 cm  Description: fragments of tan to red skin and soft tissue with focal yellow discoloration and purulent material  Representative submitted in 1-2 cassette(s).     Final Diagnosis performed by Delorse Lek, MD.  Electronically signed 01/10/2016 2:08:37PM    The electronic signature indicates that the named Attending Pathologist has  evaluated the specimen  Technical component performed at [facility], [address] [place], [place] [address] Lab: [phone] Dir: Will                         iam F. Evette Doffing, MD  Professional component performed at [facility], [facility], [place], [place], [place] [address] Lab: [phone] Dir: Dellia Nims. Reuel Derby, MD    . Glucose-Capillary 01/09/2016 116* 65 - 99 mg/dL Final  . Specimen Description  01/09/2016 BLOOD LEFT ASSIST CONTROL   Final  . Special Requests 01/09/2016 BOTTLES DRAWN AEROBIC AND ANAEROBIC [place]   Final  . Culture 01/09/2016 NO GROWTH 5 DAYS   Final  . Report Status 01/09/2016 01/14/2016 FINAL   Final  . Glucose-Capillary 01/09/2016 226* 65 - 99 mg/dL Final  . Comment 1 01/09/2016 Notify RN   Final  . Glucose-Capillary 01/09/2016 94  65 - 99 mg/dL Final  . Glucose-Capillary 01/10/2016 107* 65 - 99 mg/dL Final  . Glucose-Capillary 01/10/2016 501* 65 - 99 mg/dL Final  . Glucose-Capillary 01/10/2016 91  65 - 99 mg/dL Final  . Glucose-Capillary 01/10/2016 85  65 - 99 mg/dL Final    Assessment:  Kent [name] is a 61 y.o. 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  Tumor was p16 IHC positive (high risk HPV).  CT soft tissue neck on 08/17/2015 revealed a slowly progressive over years, infiltrative, deep 4.1 x 5.4 cm LEFT parotid mass with widespread ipsilateral bulky adenopathy.  [facility] scan at [facility] on 09/17/2015 revealed left base of tongue malignancy corresponding to asymmetric enhancement identified on the neck CT with hypermetabolic extensive conglomerate left cervical adenopathy (level II-IV) extending from the level of the angle of the mandible down inferiorly to the level of the cricoid cartilage.  There was clustered subcentimeter left level IB submandibular nodes with mild FDG activity.  There was no evidence of metastatic disease.  Alpha gal IgE was 1.21 (< 0.35) c/w IgE antibodies to galactose-alpha-1,3 galactose (oligosaccharide part on Fab portion of the cetuximab heavy chain) and suggestive of potential cetuximab reaction.  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  Pathologic stage was T4NxM0.  He took imatinib for 1 year.  He is day 35 s/p cycle #3 cisplatin (10/22/2015 -  12/04/2015) with concurrent radiation.  Radiation completed on 12/21/2015.  Left neck adenopathy has dramatically improved.  He has recurrent hyponatremia. He has received daily IVF with electrolyte supplementation every day during clinic.  Yesterday he left early because of his frustration.  He was admitted overnight to [facility] on 11/15/2015 for hyponatremia (sodium 122).  He received IVF overnight.  He was admitted to [facility] from 12/08/2015 - 12/09/2015 for hydration and electrolyte replacement. Sodium was 126.  He was readmitted to [facility] from 12/09/2015 - 12/12/2015 secondary to nausea, vomiting, and minimal oral intake.  He received IVF with electrolyte replacement and anti-emetics.  A PICC line was placed for continuation of fluids in the outpatient department.  He was admitted to [facility] from 12/28/2015 - 12/31/2015 with dehydration and hyponatremia.  He received IVF.  He was discovered to have dry gangrene of the right second toe.  Angiogram revealed good perfusion. He was to follow-up with Dr. Lucky Cowboy in the outpatient department.  Symptomatically, he has a new tender erythematous and edematous left  shoulder worrisome for cellulitis/septic arthritis.  WBC is elevated.  He has dry gangrene of the second right toe which appears slightly worse.  PICC line is unremarkable.  He has tachycardia with an irregular rhythm.  His oral intake remains poor.  He has hyponatremia (125), hypokalemia (3.3) and hypomagnesemia (1.4).    Plan: 1.  Labs today:  CBC with diff, BMP, Mg. 2.  Discuss admission to the hospital for possible sepsis.  Discuss plan for blood cultures, imaging of left shoulder and broad spectrum antibiotics.  Would obtain echocardiogram to rule out endocarditis.  Anticipate ID consult. 3.  Discuss plan to follow-up with vascular surgery to evaluate gangrene of right second toe. 4.  Discuss plan for EKG and admission to telemetry unit. 5.  Discuss plan for ongoing fluids and electrolyte  supplementation. 6.  Discussed with hospitalist service.   Lequita Asal, MD  04/07/2016, 5:32 AM

## 2016-05-09 ENCOUNTER — Inpatient Hospital Stay: Payer: Managed Care, Other (non HMO) | Attending: Hematology and Oncology | Admitting: Hematology and Oncology

## 2016-05-09 VITALS — BP 138/103 | HR 101 | Temp 98.6°F | Resp 18 | Wt 172.8 lb

## 2016-05-09 DIAGNOSIS — M129 Arthropathy, unspecified: Secondary | ICD-10-CM | POA: Insufficient documentation

## 2016-05-09 DIAGNOSIS — I10 Essential (primary) hypertension: Secondary | ICD-10-CM | POA: Diagnosis not present

## 2016-05-09 DIAGNOSIS — Z800 Family history of malignant neoplasm of digestive organs: Secondary | ICD-10-CM | POA: Diagnosis not present

## 2016-05-09 DIAGNOSIS — Z8739 Personal history of other diseases of the musculoskeletal system and connective tissue: Secondary | ICD-10-CM

## 2016-05-09 DIAGNOSIS — E785 Hyperlipidemia, unspecified: Secondary | ICD-10-CM

## 2016-05-09 DIAGNOSIS — Z7982 Long term (current) use of aspirin: Secondary | ICD-10-CM

## 2016-05-09 DIAGNOSIS — E871 Hypo-osmolality and hyponatremia: Secondary | ICD-10-CM | POA: Insufficient documentation

## 2016-05-09 DIAGNOSIS — Z794 Long term (current) use of insulin: Secondary | ICD-10-CM | POA: Diagnosis not present

## 2016-05-09 DIAGNOSIS — E119 Type 2 diabetes mellitus without complications: Secondary | ICD-10-CM

## 2016-05-09 DIAGNOSIS — C01 Malignant neoplasm of base of tongue: Secondary | ICD-10-CM | POA: Diagnosis not present

## 2016-05-09 DIAGNOSIS — E669 Obesity, unspecified: Secondary | ICD-10-CM | POA: Diagnosis not present

## 2016-05-09 DIAGNOSIS — K1379 Other lesions of oral mucosa: Secondary | ICD-10-CM

## 2016-05-09 DIAGNOSIS — Z85028 Personal history of other malignant neoplasm of stomach: Secondary | ICD-10-CM | POA: Insufficient documentation

## 2016-05-09 DIAGNOSIS — E876 Hypokalemia: Secondary | ICD-10-CM | POA: Diagnosis not present

## 2016-05-09 DIAGNOSIS — Z79899 Other long term (current) drug therapy: Secondary | ICD-10-CM | POA: Insufficient documentation

## 2016-05-09 NOTE — Progress Notes (Signed)
Patient states he does not sleep well at night because of pain in his left jaw.  Does not eat well on that side either.  Appetite increasing.  States his shoulder has healed.  Good range of motion. Patient states he is back to work on light duty.  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  Patient takes a lot of tylenol and ibuprofen for jaw pain. Wife states he takes them like eating candy.

## 2016-05-09 NOTE — Progress Notes (Signed)
[facility] day:  05/09/16  Chief Complaint: Kent [name] Charlson is a 61 y.o. 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  HPI:  The patient was last seen by Dr. Mike Gip in the medical oncology clinic on 01/08/2016.  At that time, he was on day 35 of cycle #3 cisplatin and radiation.   He had a new tender erythematous and edematous left shoulder worrisome for cellulitis/septic arthritis.  WBC was elevated.  He had dry gangrene of the second right toe which appeared slightly worse.  PICC line was unremarkable.  He had tachycardia with an irregular rhythm.  His oral intake was poor.  He had hyponatremia (125), hypokalemia (3.3) and hypomagnesemia (1.4).  He was admitted to the hospital.  CT scan of the left shoulder on 01/08/2016 revealed a large collection of air and fluid in the superficial soft tissues around the shoulder.  Findings worrisome for gas-forming infection.  There was no evidence of osteomyelitis.  He was admitted to [facility] from 01/08/2016 - 01/10/2016.  He was diagnosed with necrotizing fasciitis.  Blood cultures grew staph aureus.  He was treated with vancomycin, Zosyn, and clindamycin. He  underwent debridement of the left shoulder and wound VAC placement by Dr. Hervey Ard on 01/09/2016.  Intra-operative wound cultures on 11/20/207 revealed MSSA, pan sensitive.  Because of a need for further exploration and intensive wound care, he was transferred to [facility]   He was admitted to [facility] with necrotizing fasciitis of the left shoulder unrelated to his treatment.  He underwent a second debridement on 01/11/2016.  After debridement, he had an exposed acromioclavicular joint.  He underwent fasciocutaneous pectoral flap to the left shoulder with layered wound closure of the posterior shoulder and split thickness skin graft to the pectoralis major on 01/17/2016.  Bone biopsy revealed  ragged necrotic bone with associated acute inflammation c/w acute osteomyelitis.  He underwent TEE and was felt to have AV vegetation versus Lambl's excrescence.  He was transitioned to linezolid due to vancomycin shortage.  He also received clindamycin and Zosyn.  He was treated with at least 4 weeks of antibiotics for presumed septic arthritis.  He was seen by Dr. Baruch Gouty on 01/28/2016.  He had active oral Candidiasis.  He was presccibed fluconazole.  He states that he has been home for about 2 1/2 months.  PICC line was removed in 03/2016 or 04/2016.  He notes pain on the left side of his mouth when he chews.  He has been using ibuprofen and Tylenol for pain.  He is back to work.   Past Medical History:  Diagnosis Date  . Arthritis   . Diabetes mellitus without complication ([place])   . Hyperlipidemia   . Hypertension   . Obesity   . 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 Tongue cancer ([place])    Stage IV squamous cell carcinoma of the base of the tongue/left tonsil    Past Surgical History:  Procedure Laterality Date  . gastric tumor removed  2010  . I&D EXTREMITY Left 01/09/2016   Procedure: IRRIGATION AND DEBRIDEMENT EXTREMITY;  Surgeon: Robert Bellow, MD;  Location: [facility] ORS;  Service: General;  Laterality: Left;  . PERIPHERAL VASCULAR CATHETERIZATION Right 12/31/2015   Procedure: Lower Extremity Angiography;  Surgeon: Algernon Huxley, MD;  Location: [place] [facility];  Service: Cardiovascular;  Laterality: Right;  .  tongue mass biopsy      Family History  Problem Relation Age of Onset  . Heart disease Mother   . Hypertension Father   . Cancer Sister     stomach  . Heart disease Brother     MI  . Cancer Daughter   . Cancer Son     testicular  . ADD / ADHD Sister     Social History:  reports that he has never smoked. He has never used smokeless tobacco. He reports that he does not drink alcohol or use drugs.  He smokes a pipe and cigar rarely (once every 3  months).  He has a son and daughter.  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  He is back to work full-time x 1 month.  He is accompanied by his wife today.  Allergies: No Known Allergies  Current Medications: Current Outpatient Prescriptions  Medication Sig Dispense Refill  . lisinopril (PRINIVIL,ZESTRIL) 10 MG tablet Take 10 mg by mouth.    . potassium chloride (KLOR-CON) 20 MEQ packet Take 40 mEq by mouth 2 (two) times daily. 60 packet 0  . aspirin EC 81 MG tablet Take 81 mg by mouth daily as needed for moderate pain.    . bacitracin 500 UNIT/GM ointment Apply topically.    . cetirizine (ZYRTEC) 10 MG tablet Take 1 tablet (10 mg total) by mouth daily. (Patient not taking: Reported on 05/09/2016) 30 tablet 11  . feeding supplement (BOOST / RESOURCE BREEZE) LIQD Take 1 Container by mouth 3 (three) times daily between meals. (Patient not taking: Reported on 05/09/2016) 20000 mL 2  . fluticasone (FLONASE) 50 MCG/ACT nasal spray Place 2 sprays into both nostrils daily. (Patient not taking: Reported on 05/09/2016) 16 g 6  . insulin glargine (LANTUS) 100 UNIT/ML injection Inject 0.2 mLs (20 Units total) into the skin daily. (Patient not taking: Reported on 05/09/2016) 10 mL 11  . lidocaine (XYLOCAINE) 2 % solution Use as directed 15 mLs in the mouth or throat every 6 (six) hours as needed for mouth pain. (Patient not taking: Reported on 05/09/2016) 100 mL 0  . magic mouthwash SOLN Take 5 mLs by mouth 4 (four) times daily as needed for mouth pain. (Patient not taking: Reported on 05/09/2016) 480 mL 1  . metFORMIN (GLUCOPHAGE) 500 MG tablet Take 500 mg by mouth daily with breakfast.    . neomycin-polymyxin-hydrocortisone (CORTISPORIN) otic solution Place 4 drops into the left ear 4 (four) times daily. (Patient not taking: Reported on 05/09/2016) 10 mL 0  . piperacillin-tazobactam (ZOSYN) 3.375 (3-0.375) g injection Inject 3,375 mg (3.375 g total) into the vein every 6 (six) hours. (Patient  not taking: Reported on 05/09/2016) 1 each 0   No current facility-administered medications for this visit.     Review of Systems:  GENERAL:  Weak and tired.   Weight down 2 pounds since 01/08/2016. PERFORMANCE STATUS (ECOG):  1 HEENT:  Thick oral secretions. Small left tongue lesion/soreness.  No pain with swallowing.  No runny nose. Lungs:  Intermittent cough. No shortness of breath.  No hemoptysis. Cardiac:  No chest pain, palpitations, orthopnea, or PND.  GI:  Poor oral intake (varies from day to day).  No nausea, vomiting, diarrhea, constipation, melena or hematochezia.  GU:  No urgency, frequency, dysuria, or hematuria.  Musculoskeletal:  Left shoulder healed.  No muscle tenderness. Extremities:  Right second toe gangrene, worse per wife.  Per patient, no planned intervention.  No pain.  Skin:  Well-healing scars around left shoulder/left axilla. No rashes or other skin changes. Neuro:  No headache, numbness or weakness, balance or coordination issues. Endocrine:  Diabetes.  No thyroid issues, hot flashes or night sweats. Psych:  No mood changes, depression or anxiety. Pain:  Left jaw pain. No throat pain.  Review of systems:  All other systems reviewed and found to be negative.  Physical Exam: Blood pressure (!) 138/103, pulse (!) 101, temperature 98.6 F (37 C), temperature source Tympanic, resp. rate 18, weight 172 lb 13.5 oz (78.4 kg). GENERAL:  Well appearing gentleman sitting comfortably in the exam room in no acute distress. MENTAL STATUS:  Alert and oriented to person, place and time. HEAD:  Short gray hair.  Goatee.  Normocephalic, atraumatic, face symmetric, no Cushingoid features. EYES:  Blue eyes.  Pupils equal round and reactive to light and accomodation.  No conjunctivitis or scleral icterus. ENT:  Oropharynx with thin yellow secretions in posterior pharynx.  No thrush.  Tongue with tiny left sided red lesion. NECK:  Hyperpigmentation left neck with decreased adenopathy  s/p radiation.   RESPIRATORY:  Clear to auscultation without rales, wheezes or rhonchi. CARDIOVASCULAR:  Tachycardia.  Regular rhythm.  No murmur, rub or gallop. ABDOMEN:  Soft, non-tender, with active bowel sounds, and no hepatosplenomegaly.  No masses. SKIN:  Left shoulder s/p debridement and skin graft.  No erythema or induration. EXTREMITIES:  No numbness or tingling.  Tip of right second toe black with slight extension (dry) with base of erythema.  Left shoulder with decreased range of motion.   LYMPH NODES: Soft 2 cm left neck adenopathy (? scar) in field of radiation.  No palpable supraclavicular, axillary or inguinal adenopathy  NEUROLOGICAL: Unremarkable. PSYCH:  Appropriate.     No visits with results within 3 Day(s) from this visit.  Latest known visit with results is:  Admission on 01/08/2016, Discharged on 01/10/2016  Component Date Value Ref Range Status  . Specimen Description 01/08/2016 BLOOD LEFT ASSIST CONTROL   Final  . Special Requests 01/08/2016 BOTTLES DRAWN AEROBIC AND ANAEROBIC [id number]   Final  . Culture 01/08/2016 NO GROWTH 5 DAYS   Final  . Report Status 01/08/2016 01/13/2016 FINAL   Final  . Specimen Description 01/08/2016 BLOOD RIGHT ASSIST CONTROL   Final  . Special Requests 01/08/2016 BOTTLES DRAWN AEROBIC AND ANAEROBIC [place]   Final  . Culture  Setup Time 01/08/2016    Final                   Value:GRAM POSITIVE COCCI ANAEROBIC BOTTLE ONLY CRITICAL RESULT CALLED TO, READ BACK BY AND VERIFIED WITH: CHRISTINE KATSOUDAS 01/09/16 1146 SGD Performed at [facility]   . Culture 01/08/2016 STAPHYLOCOCCUS AUREUS*  Final  . Report Status 01/08/2016 01/11/2016 FINAL   Final  . Organism ID, Bacteria 01/08/2016 STAPHYLOCOCCUS AUREUS   Final  . Sed Rate 01/08/2016 127* 0 - 20 mm/hr Final  . Weight 01/09/2016 2646.4  oz Final  . Height 01/09/2016 67  in Final  . BP 01/09/2016 128/76  mmHg Final  . Ferritin 01/08/2016 1927* 24 - 336 ng/mL Final   . Sodium 01/09/2016 129* 135 - 145 mmol/L Final  . Potassium 01/09/2016 3.0* 3.5 - 5.1 mmol/L Final  . Chloride 01/09/2016 93* 101 - 111 mmol/L Final  . CO2 01/09/2016 26  22 - 32 mmol/L Final  . Glucose, Bld 01/09/2016 117* 65 - 99 mg/dL Final  . BUN 01/09/2016 14  6 - 20 mg/dL  Final  . Creatinine, Ser 01/09/2016 0.59* 0.61 - 1.24 mg/dL Final  . Calcium 01/09/2016 7.6* 8.9 - 10.3 mg/dL Final  . GFR calc non Af Amer 01/09/2016 >60  >60 mL/min Final  . GFR calc Af Amer 01/09/2016 >60  >60 mL/min Final   Comment: (NOTE) The eGFR has been calculated using the CKD EPI equation. This calculation has not been validated in all clinical situations. eGFR's persistently <60 mL/min signify possible Chronic Kidney Disease.   . Anion gap 01/09/2016 10  5 - 15 Final  . WBC 01/09/2016 12.2* 3.8 - 10.6 K/uL Final  . RBC 01/09/2016 2.52* 4.40 - 5.90 MIL/uL Final  . Hemoglobin 01/09/2016 7.4* 13.0 - 18.0 g/dL Final  . HCT 01/09/2016 21.4* 40.0 - 52.0 % Final  . MCV 01/09/2016 84.7  80.0 - 100.0 fL Final  . MCH 01/09/2016 29.2  26.0 - 34.0 pg Final  . MCHC 01/09/2016 34.5  32.0 - 36.0 g/dL Final  . RDW 01/09/2016 17.5* 11.5 - 14.5 % Final  . Platelets 01/09/2016 153  150 - 440 K/uL Final  . Magnesium 01/09/2016 1.7  1.7 - 2.4 mg/dL Final  . Glucose-Capillary 01/08/2016 209* 65 - 99 mg/dL Final  . Glucose-Capillary 01/09/2016 115* 65 - 99 mg/dL Final  . Enterococcus species 01/08/2016 NOT DETECTED  NOT DETECTED Final  . Listeria monocytogenes 01/08/2016 NOT DETECTED  NOT DETECTED Final  . Staphylococcus species 01/08/2016 DETECTED* NOT DETECTED Final   Comment: CRITICAL RESULT CALLED TO, READ BACK BY AND VERIFIED WITH: CHRISTINE KATSOUDAS 01/09/16 1146 SGD   . Staphylococcus aureus 01/08/2016 DETECTED* NOT DETECTED Final   Comment: CRITICAL RESULT CALLED TO, READ BACK BY AND VERIFIED WITH: CHRISTINE KATSOUDAS 01/09/16 1146 SGD   . Methicillin resistance 01/08/2016 NOT DETECTED  NOT DETECTED Final   . Streptococcus species 01/08/2016 NOT DETECTED  NOT DETECTED Final  . Streptococcus agalactiae 01/08/2016 NOT DETECTED  NOT DETECTED Final  . Streptococcus pneumoniae 01/08/2016 NOT DETECTED  NOT DETECTED Final  . Streptococcus pyogenes 01/08/2016 NOT DETECTED  NOT DETECTED Final  . Acinetobacter baumannii 01/08/2016 NOT DETECTED  NOT DETECTED Final  . Enterobacteriaceae species 01/08/2016 NOT DETECTED  NOT DETECTED Final  . Enterobacter cloacae complex 01/08/2016 NOT DETECTED  NOT DETECTED Final  . Escherichia coli 01/08/2016 NOT DETECTED  NOT DETECTED Final  . Klebsiella oxytoca 01/08/2016 NOT DETECTED  NOT DETECTED Final  . Klebsiella pneumoniae 01/08/2016 NOT DETECTED  NOT DETECTED Final  . Proteus species 01/08/2016 NOT DETECTED  NOT DETECTED Final  . Serratia marcescens 01/08/2016 NOT DETECTED  NOT DETECTED Final  . Haemophilus influenzae 01/08/2016 NOT DETECTED  NOT DETECTED Final  . Neisseria meningitidis 01/08/2016 NOT DETECTED  NOT DETECTED Final  . Pseudomonas aeruginosa 01/08/2016 NOT DETECTED  NOT DETECTED Final  . Candida albicans 01/08/2016 NOT DETECTED  NOT DETECTED Final  . Candida glabrata 01/08/2016 NOT DETECTED  NOT DETECTED Final  . Candida krusei 01/08/2016 NOT DETECTED  NOT DETECTED Final  . Candida parapsilosis 01/08/2016 NOT DETECTED  NOT DETECTED Final  . Candida tropicalis 01/08/2016 NOT DETECTED  NOT DETECTED Final  . Specimen Description 01/09/2016 SHOULDER   Final  . Special Requests 01/09/2016 NONE   Final  . Gram Stain 01/09/2016    Final                   Value:ABUNDANT WBC PRESENT,BOTH PMN AND MONONUCLEAR ABUNDANT GRAM POSITIVE COCCI IN PAIRS   . Culture 01/09/2016    Final  Value:ABUNDANT STAPHYLOCOCCUS AUREUS NO ANAEROBES ISOLATED Performed at [facility]   . Report Status 01/09/2016 01/17/2016 FINAL   Final  . Organism ID, Bacteria 01/09/2016 STAPHYLOCOCCUS AUREUS   Final  . SURGICAL PATHOLOGY 01/09/2016    Final                    Value:Surgical Pathology CASE: [id number] PATIENT: [facility] Pathology Report     SPECIMEN SUBMITTED: A. Debridement, left shoulder  CLINICAL HISTORY: None provided  PRE-OPERATIVE DIAGNOSIS: Necrotizing fasciitis  POST-OPERATIVE DIAGNOSIS: Same as pre-op     DIAGNOSIS: A. SKIN, LEFT SHOULDER; DEBRIDEMENT: - CHANGES CONSISTENT WITH NECROTIZING FASCIITIS.   GROSS DESCRIPTION:  A. Labeled: left shoulder debridement  Tissue fragment(s): 5  Size: aggregate, 10.1 x 8.6 x 1.6 cm  Description: fragments of tan to red skin and soft tissue with focal yellow discoloration and purulent material  Representative submitted in 1-2 cassette(s).     Final Diagnosis performed by Delorse Lek, MD.  Electronically signed 01/10/2016 2:08:37PM    The electronic signature indicates that the named Attending Pathologist has evaluated the specimen  Technical component performed at [facility], [address] [place], [place] [address] Lab: [phone] Dir: Will                         iam F. Evette Doffing, MD  Professional component performed at [facility], [facility], [place], [place], [place] [address] Lab: [phone] Dir: Dellia Nims. Reuel Derby, MD    . Glucose-Capillary 01/09/2016 116* 65 - 99 mg/dL Final  . Specimen Description 01/09/2016 BLOOD LEFT ASSIST CONTROL   Final  . Special Requests 01/09/2016 BOTTLES DRAWN AEROBIC AND ANAEROBIC [place]   Final  . Culture 01/09/2016 NO GROWTH 5 DAYS   Final  . Report Status 01/09/2016 01/14/2016 FINAL   Final  . Glucose-Capillary 01/09/2016 226* 65 - 99 mg/dL Final  . Comment 1 01/09/2016 Notify RN   Final  . Glucose-Capillary 01/09/2016 94  65 - 99 mg/dL Final  . Glucose-Capillary 01/10/2016 107* 65 - 99 mg/dL Final  . Glucose-Capillary 01/10/2016 501* 65 - 99 mg/dL Final  . Glucose-Capillary 01/10/2016 91  65 - 99 mg/dL Final  . Glucose-Capillary 01/10/2016 85  65 - 99 mg/dL Final     Assessment:  Kent [name] is a 61 y.o. 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  Tumor was p16 IHC positive (high risk HPV).  He received 3 cycles of cisplatin (10/22/2015 - 12/04/2015) with concurrent radiation.  Radiation completed on 12/21/2015.  CT soft tissue neck on 08/17/2015 revealed a slowly progressive over years, infiltrative, deep 4.1 x 5.4 cm LEFT parotid mass with widespread ipsilateral bulky adenopathy.  PET scan at [facility] on 09/17/2015 revealed left base of tongue malignancy corresponding to asymmetric enhancement identified on the neck CT with hypermetabolic extensive conglomerate left cervical adenopathy (level II-IV) extending from the level of the angle of the mandible down inferiorly to the level of the cricoid cartilage.  There was clustered subcentimeter left level IB submandibular nodes with mild FDG activity.  There was no evidence of metastatic disease.  Alpha gal IgE was 1.21 (< 0.35) c/w IgE antibodies to galactose-alpha-1,3 galactose (oligosaccharide part on Fab portion of the cetuximab heavy chain) and suggestive of potential cetuximab reaction.  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  Pathology reveled a 19 cm GIST with mitotic  rate of 1/50 HPF (low grade).  Pathologic stage was T4NxM0.  He took imatinib for 1 year.  He has a history of recurrent hyponatremia. He has received daily IVF with electrolyte supplementation every day during clinic.    He was admitted overnight to [facility] on 11/15/2015 for hyponatremia (sodium 122).  He received IVF overnight.  He was admitted to [facility] from 12/08/2015 - 12/09/2015 for hydration and electrolyte replacement. Sodium was 126.  He was readmitted to [facility] from 12/09/2015 - 12/12/2015 secondary to nausea, vomiting, and minimal oral intake.  He received IVF with electrolyte replacement and anti-emetics.  A PICC line was placed for  continuation of fluids in the outpatient department.  It has since been removed.  He was admitted to [facility] from 12/28/2015 - 12/31/2015 with dehydration and hyponatremia.  He received IVF.  He was discovered to have dry gangrene of the right second toe.  Angiogram revealed good perfusion. He was to follow-up with Dr. Lucky Cowboy in the outpatient department.  He was admitted to [facility] from 01/08/2016 - 01/10/2016 with necrotizing fasciitis of the left shoulder.  Blood cultures grew staph aureus.  He was treated with vancomycin, Zosyn, and clindamycin. He  underwent debridement of the left shoulder and wound VAC placement on 01/09/2016.  Intra-operative wound cultures on 11/20/207 revealed MSSA, pan sensitive.    He was transferred to [facility] for further wound care.  He underwent a second debridement on 01/11/2016.  He underwent fasciocutaneous pectoral flap to the left shoulder with layered wound closure of the posterior shoulder and split thickness skin graft to the pectoralis major on 01/17/2016.  Bone biopsy revealed ragged necrotic bone with associated acute inflammation c/w acute osteomyelitis.  TEE revealed an AV vegetation versus Lambl's excrescence.  He was transitioned to linezolid due to vancomycin shortage.  He also received clindamycin and Zosyn.  He was treated with at least 4 weeks of antibiotics for presumed septic arthritis.  Symptomatically, he remains fatigued, but much improved after recent hospitalization at [facility].  He is using excess Tylenol and ibuprofen for left sided oral pain.  He has dry gangrene of the second right toe.  His oral intake has improved.  He has gained 7 pounds in the past 3 months.    Plan: 1.  Discuss interval surgeries and infection.  Review records from [facility] 2.  Discuss plans for follow-up with ENT. 3.  Discuss follow-up PET scan (discuss with Dr. Baruch Gouty). 4.  Discuss decreasing use of Tylenol and ibuprofen. 5.  RTC next week for labs (CBC with diff, CMP, Mg) 6.  RTC on  06/23/2016 for MD assessment (coordinate with Dr. Olena Leatherwood appt)  I saw and evaluated the patient, participating in the key portions of the service and reviewing pertinent diagnostic studies and records.  I reviewed the nurse practitioner's note and agree with the findings and the plan.  The assessment and plan were discussed with the patient.  Additional diagnostic studies of a PET scanare needed to restage the patient and would change the clinical management.  Multiple questions were asked by the patient and answered.    Lucendia Herrlich, NP   Lequita Asal, MD 05/09/2016, 8:56 PM

## 2016-05-16 ENCOUNTER — Inpatient Hospital Stay: Payer: Managed Care, Other (non HMO)

## 2016-05-16 DIAGNOSIS — C01 Malignant neoplasm of base of tongue: Secondary | ICD-10-CM

## 2016-05-16 LAB — CBC WITH DIFFERENTIAL/PLATELET
Basophils Absolute: 0.1 10*3/uL (ref 0–0.1)
Basophils Relative: 1 %
Eosinophils Absolute: 0.1 10*3/uL (ref 0–0.7)
Eosinophils Relative: 1 %
HCT: 35.6 % — ABNORMAL LOW (ref 40.0–52.0)
Hemoglobin: 12.6 g/dL — ABNORMAL LOW (ref 13.0–18.0)
Lymphocytes Relative: 8 %
Lymphs Abs: 0.7 10*3/uL — ABNORMAL LOW (ref 1.0–3.6)
MCH: 29.9 pg (ref 26.0–34.0)
MCHC: 35.5 g/dL (ref 32.0–36.0)
MCV: 84 fL (ref 80.0–100.0)
Monocytes Absolute: 0.6 10*3/uL (ref 0.2–1.0)
Monocytes Relative: 7 %
Neutro Abs: 6.8 10*3/uL — ABNORMAL HIGH (ref 1.4–6.5)
Neutrophils Relative %: 83 %
Platelets: 332 10*3/uL (ref 150–440)
RBC: 4.23 MIL/uL — ABNORMAL LOW (ref 4.40–5.90)
RDW: 14.5 % (ref 11.5–14.5)
WBC: 8.2 10*3/uL (ref 3.8–10.6)

## 2016-05-16 LAB — COMPREHENSIVE METABOLIC PANEL
ALT: 14 U/L — ABNORMAL LOW (ref 17–63)
AST: 18 U/L (ref 15–41)
Albumin: 4.1 g/dL (ref 3.5–5.0)
Alkaline Phosphatase: 99 U/L (ref 38–126)
Anion gap: 8 (ref 5–15)
BUN: 20 mg/dL (ref 6–20)
CO2: 30 mmol/L (ref 22–32)
Calcium: 9 mg/dL (ref 8.9–10.3)
Chloride: 97 mmol/L — ABNORMAL LOW (ref 101–111)
Creatinine, Ser: 1.02 mg/dL (ref 0.61–1.24)
GFR calc Af Amer: >60 mL/min (ref >60)
GFR calc non Af Amer: >60 mL/min (ref >60)
Glucose, Bld: 102 mg/dL — ABNORMAL HIGH (ref 65–99)
Potassium: 3 mmol/L — ABNORMAL LOW (ref 3.5–5.1)
Sodium: 135 mmol/L (ref 135–145)
Total Bilirubin: 0.5 mg/dL (ref 0.3–1.2)
Total Protein: 7 g/dL (ref 6.5–8.1)

## 2016-05-16 LAB — MAGNESIUM: Magnesium: 1.9 mg/dL (ref 1.7–2.4)

## 2016-05-18 ENCOUNTER — Encounter: Payer: Self-pay | Admitting: Hematology and Oncology

## 2016-05-18 DIAGNOSIS — Z8739 Personal history of other diseases of the musculoskeletal system and connective tissue: Secondary | ICD-10-CM | POA: Insufficient documentation

## 2016-05-18 DIAGNOSIS — K1379 Other lesions of oral mucosa: Secondary | ICD-10-CM | POA: Insufficient documentation

## 2016-05-19 ENCOUNTER — Other Ambulatory Visit: Payer: Self-pay | Admitting: *Deleted

## 2016-05-19 ENCOUNTER — Other Ambulatory Visit: Payer: Self-pay | Admitting: Hematology and Oncology

## 2016-05-19 ENCOUNTER — Telehealth: Payer: Self-pay | Admitting: *Deleted

## 2016-05-19 DIAGNOSIS — C029 Malignant neoplasm of tongue, unspecified: Secondary | ICD-10-CM

## 2016-05-19 DIAGNOSIS — C01 Malignant neoplasm of base of tongue: Secondary | ICD-10-CM

## 2016-05-19 DIAGNOSIS — E876 Hypokalemia: Secondary | ICD-10-CM

## 2016-05-19 MED ORDER — POTASSIUM CHLORIDE CRYS ER 20 MEQ PO TBCR: EXTENDED_RELEASE_TABLET | ORAL | 1 refills | Status: DC

## 2016-05-19 NOTE — Telephone Encounter (Signed)
-----   Message from Lequita Asal, MD sent at 05/17/2016 11:27 AM EDT ----- Regarding: Did someone call patient about low potassium on Friday?   ----- Message ----- From: Interface, Lab In [place] Sent: 05/16/2016   2:46 PM To: Lequita Asal, MD

## 2016-05-19 NOTE — Telephone Encounter (Signed)
Called patient's wife to inform her K+ has been called into pharmacy for patient.  Patient should start with 2 tablets (20 meq) for a total of 40 meq today.  Then he will take 1 tablet (20 meq) daily.  Also informed her that patient is being referred back to Dr. Charolett Bumpers for followup.  Will recheck K+ later this week. Scheduler will contact patient with appointments.

## 2016-05-23 ENCOUNTER — Inpatient Hospital Stay: Payer: Managed Care, Other (non HMO)

## 2016-05-23 DIAGNOSIS — C029 Malignant neoplasm of tongue, unspecified: Secondary | ICD-10-CM

## 2016-05-23 DIAGNOSIS — C01 Malignant neoplasm of base of tongue: Secondary | ICD-10-CM | POA: Diagnosis not present

## 2016-05-23 LAB — COMPREHENSIVE METABOLIC PANEL
ALT: 12 U/L — ABNORMAL LOW (ref 17–63)
AST: 15 U/L (ref 15–41)
Albumin: 4 g/dL (ref 3.5–5.0)
Alkaline Phosphatase: 102 U/L (ref 38–126)
Anion gap: 6 (ref 5–15)
BUN: 18 mg/dL (ref 6–20)
CO2: 30 mmol/L (ref 22–32)
Calcium: 9 mg/dL (ref 8.9–10.3)
Chloride: 101 mmol/L (ref 101–111)
Creatinine, Ser: 1.11 mg/dL (ref 0.61–1.24)
GFR calc Af Amer: >60 mL/min (ref >60)
GFR calc non Af Amer: >60 mL/min (ref >60)
Glucose, Bld: 116 mg/dL — ABNORMAL HIGH (ref 65–99)
Potassium: 3.5 mmol/L (ref 3.5–5.1)
Sodium: 137 mmol/L (ref 135–145)
Total Bilirubin: 0.5 mg/dL (ref 0.3–1.2)
Total Protein: 6.9 g/dL (ref 6.5–8.1)

## 2016-05-23 LAB — CBC WITH DIFFERENTIAL/PLATELET
Basophils Absolute: 0.1 10*3/uL (ref 0–0.1)
Basophils Relative: 1 %
Eosinophils Absolute: 0.1 10*3/uL (ref 0–0.7)
Eosinophils Relative: 1 %
HCT: 36.4 % — ABNORMAL LOW (ref 40.0–52.0)
Hemoglobin: 12.8 g/dL — ABNORMAL LOW (ref 13.0–18.0)
Lymphocytes Relative: 9 %
Lymphs Abs: 0.7 10*3/uL — ABNORMAL LOW (ref 1.0–3.6)
MCH: 30 pg (ref 26.0–34.0)
MCHC: 35.2 g/dL (ref 32.0–36.0)
MCV: 85.2 fL (ref 80.0–100.0)
Monocytes Absolute: 0.6 10*3/uL (ref 0.2–1.0)
Monocytes Relative: 8 %
Neutro Abs: 6.1 10*3/uL (ref 1.4–6.5)
Neutrophils Relative %: 81 %
Platelets: 332 10*3/uL (ref 150–440)
RBC: 4.28 MIL/uL — ABNORMAL LOW (ref 4.40–5.90)
RDW: 14.7 % — ABNORMAL HIGH (ref 11.5–14.5)
WBC: 7.5 10*3/uL (ref 3.8–10.6)

## 2016-05-23 LAB — MAGNESIUM: Magnesium: 1.9 mg/dL (ref 1.7–2.4)

## 2016-05-26 ENCOUNTER — Ambulatory Visit: Payer: Self-pay | Admitting: Radiation Oncology

## 2016-05-26 ENCOUNTER — Ambulatory Visit: Payer: Managed Care, Other (non HMO)

## 2016-05-29 ENCOUNTER — Ambulatory Visit
Admission: RE | Admit: 2016-05-29 | Discharge: 2016-05-29 | Disposition: A | Discharge status: Home / Self Care | Payer: Managed Care, Other (non HMO) | Source: Ambulatory Visit | Attending: Hematology and Oncology | Admitting: Hematology and Oncology

## 2016-05-29 DIAGNOSIS — C01 Malignant neoplasm of base of tongue: Secondary | ICD-10-CM | POA: Insufficient documentation

## 2016-05-29 DIAGNOSIS — R590 Localized enlarged lymph nodes: Secondary | ICD-10-CM | POA: Insufficient documentation

## 2016-05-29 DIAGNOSIS — R918 Other nonspecific abnormal finding of lung field: Secondary | ICD-10-CM | POA: Insufficient documentation

## 2016-05-29 DIAGNOSIS — B958 Unspecified staphylococcus as the cause of diseases classified elsewhere: Secondary | ICD-10-CM

## 2016-05-29 HISTORY — DX: Disorder of kidney and ureter, unspecified: N28.9

## 2016-05-29 HISTORY — DX: Other specified postprocedural states: Z98.890

## 2016-05-29 HISTORY — DX: Osteomyelitis, unspecified: M86.9

## 2016-05-29 HISTORY — DX: Unspecified staphylococcus as the cause of diseases classified elsewhere: B95.8

## 2016-05-29 LAB — GLUCOSE, CAPILLARY: Glucose-Capillary: 87 mg/dL (ref 65–99)

## 2016-05-29 IMAGING — PT NM PET TUM IMG INITIAL (PI) SKULL BASE T - THIGH
1 of 10 series · 1 of 25 positions shown · non-contrast
Comparison: Outside study from [DATE] including only images of
the neck. Full body PET-CT from [DATE].

ADDENDUM:
Voice recognition air is noted.

Second paragraph of the NECK section should read as follows "On
today's exam, there is asymmetric persistent abnormal uptake in the
posterior left TONGUE with SUV max = 6.3.
First bullet of the IMPRESSION section should read as follows "1.
Asymmetric hypermetabolism posterior left TONGUE."
CLINICAL DATA: Subsequent treatment strategy for head and neck
cancer.
EXAM:
NUCLEAR MEDICINE PET SKULL BASE TO THIGH
TECHNIQUE: Thirteen mCi F-18 FDG was injected intravenously. Full-ring PET
imaging was performed from the skull base to thigh after the
radiotracer. CT data was obtained and used for attenuation
correction and anatomic localization.
FASTING BLOOD GLUCOSE:  Value: 87 mg/dl

[Series 3: ct wb 5.0 b30f · axial · 5.0mm · 0.98mm/px · 1 of 329 slices shown]
[im 329/329  brain]
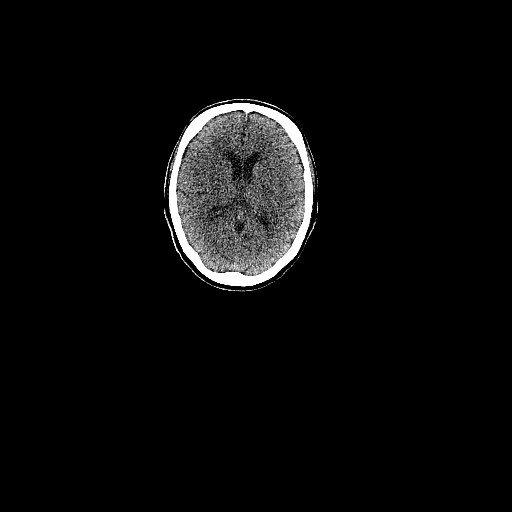

[1 of 25 positions shown; findings below may reference images not displayed]

FINDINGS: NECK

Due to technical issues, direct comparative SUV measurements to
prior study are not possible.

On today's exam, there is asymmetric persistent abnormal uptake in
the posterior left hilum with SUV max = 6.3.

Marked interval improvement hypermetabolism associated with left
cervical lymphadenopathy. Lymphadenopathy persists in the left neck
at level II and level III. Dominant lymph node today is a 1.6 cm
short axis level III node with SUV max = 2.9.

CHEST

No hypermetabolic mediastinal or hilar nodes. Tiny bilateral
pulmonary nodules are identified. Perifissural nodules left lower
lobe (image 115 series 3) are stable since [DATE] mm left upper
lobe nodule (image 100) and 2 mm right perifissural nodule (image
127) not definitely seen previously.

ABDOMEN/PELVIS

No abnormal hypermetabolic activity within the liver, pancreas,
adrenal glands, or spleen. No hypermetabolic lymph nodes in the
abdomen or pelvis.

3.9 cm right upper pole renal cyst with tiny nonobstructing right
renal stone. Diverticular changes noted left colon.

SKELETON

No focal hypermetabolic activity to suggest skeletal metastasis.
IMPRESSION: 1. Asymmetric hypermetabolism posterior left timed.
2. Mild left cervical lymphadenopathy with hypermetabolism,
qualitatively decreased when comparing back to [DATE] exam.
3. Tiny bilateral pulmonary nodules, too small to detect on PET
imaging. Attention on follow-up recommended.

## 2016-05-29 MED ORDER — FLUDEOXYGLUCOSE F - 18 (FDG) INJECTION
13.0000 | Freq: Once | INTRAVENOUS | Status: AC | PRN
  Administered 2016-05-29: 13 via INTRAVENOUS

## 2016-06-13 NOTE — Discharge Instructions (Signed)

## 2016-06-16 ENCOUNTER — Encounter: Payer: Self-pay | Admitting: *Deleted

## 2016-06-19 ENCOUNTER — Ambulatory Visit
Admission: RE | Admit: 2016-06-19 | Discharge: 2016-06-19 | Disposition: A | Discharge status: Home / Self Care | Payer: Managed Care, Other (non HMO) | Source: Ambulatory Visit | Attending: Otolaryngology | Admitting: Otolaryngology

## 2016-06-19 ENCOUNTER — Ambulatory Visit: Payer: Managed Care, Other (non HMO) | Admitting: Anesthesiology

## 2016-06-19 ENCOUNTER — Encounter
Admission: RE | Disposition: A | Discharge status: Home / Self Care | Payer: Self-pay | Source: Ambulatory Visit | Attending: Otolaryngology

## 2016-06-19 DIAGNOSIS — E119 Type 2 diabetes mellitus without complications: Secondary | ICD-10-CM | POA: Insufficient documentation

## 2016-06-19 DIAGNOSIS — Z9221 Personal history of antineoplastic chemotherapy: Secondary | ICD-10-CM | POA: Diagnosis not present

## 2016-06-19 DIAGNOSIS — Z923 Personal history of irradiation: Secondary | ICD-10-CM | POA: Diagnosis not present

## 2016-06-19 DIAGNOSIS — D490 Neoplasm of unspecified behavior of digestive system: Secondary | ICD-10-CM | POA: Diagnosis present

## 2016-06-19 DIAGNOSIS — Z85810 Personal history of malignant neoplasm of tongue: Secondary | ICD-10-CM | POA: Diagnosis not present

## 2016-06-19 DIAGNOSIS — B370 Candidal stomatitis: Secondary | ICD-10-CM | POA: Diagnosis not present

## 2016-06-19 DIAGNOSIS — I10 Essential (primary) hypertension: Secondary | ICD-10-CM | POA: Diagnosis not present

## 2016-06-19 HISTORY — PX: LARYNGOSCOPY: SHX5203

## 2016-06-19 HISTORY — PX: EXCISION OF TONGUE LESION: SHX6434

## 2016-06-19 LAB — GLUCOSE, CAPILLARY: GLUCOSE-CAPILLARY: 96 mg/dL (ref 65–99)

## 2016-06-19 SURGERY — LARYNGOSCOPY
Anesthesia: General | Wound class: Clean Contaminated

## 2016-06-19 MED ORDER — PHENYLEPHRINE HCL 0.5 % NA SOLN: NASAL | Status: DC | PRN

## 2016-06-19 MED ORDER — SUCCINYLCHOLINE CHLORIDE 20 MG/ML IJ SOLN
INTRAMUSCULAR | Status: DC | PRN
  Administered 2016-06-19: 100 mg via INTRAVENOUS

## 2016-06-19 MED ORDER — ONDANSETRON HCL 4 MG/2ML IJ SOLN
INTRAMUSCULAR | Status: DC | PRN
Start: 2016-06-19 — End: 2016-06-19
  Administered 2016-06-19: 4 mg via INTRAVENOUS

## 2016-06-19 MED ORDER — OXYCODONE HCL 5 MG PO TABS: 5.0000 mg | ORAL_TABLET | Freq: Once | ORAL | Status: DC | PRN

## 2016-06-19 MED ORDER — OXYCODONE HCL 5 MG/5ML PO SOLN: 5.0000 mg | Freq: Once | ORAL | Status: DC | PRN

## 2016-06-19 MED ORDER — PROPOFOL 10 MG/ML IV BOLUS
INTRAVENOUS | Status: DC | PRN
  Administered 2016-06-19: 130 mg via INTRAVENOUS
  Administered 2016-06-19: 20 mg via INTRAVENOUS

## 2016-06-19 MED ORDER — LIDOCAINE HCL (CARDIAC) 20 MG/ML IV SOLN
INTRAVENOUS | Status: DC | PRN
  Administered 2016-06-19: 40 mg via INTRAVENOUS

## 2016-06-19 MED ORDER — FENTANYL CITRATE (PF) 100 MCG/2ML IJ SOLN: 25.0000 ug | INTRAMUSCULAR | Status: DC | PRN

## 2016-06-19 MED ORDER — FENTANYL CITRATE (PF) 100 MCG/2ML IJ SOLN
INTRAMUSCULAR | Status: DC | PRN
  Administered 2016-06-19: 50 ug via INTRAVENOUS

## 2016-06-19 MED ORDER — LIDOCAINE HCL 4 % MT SOLN
OROMUCOSAL | Status: DC | PRN
  Administered 2016-06-19: 4 mL via TOPICAL

## 2016-06-19 MED ORDER — GLYCOPYRROLATE 0.2 MG/ML IJ SOLN
INTRAMUSCULAR | Status: DC | PRN
  Administered 2016-06-19: .1 mg via INTRAVENOUS

## 2016-06-19 MED ORDER — DEXAMETHASONE SODIUM PHOSPHATE 4 MG/ML IJ SOLN
INTRAMUSCULAR | Status: DC | PRN
  Administered 2016-06-19: 10 mg via INTRAVENOUS

## 2016-06-19 MED ORDER — ONDANSETRON HCL 4 MG/2ML IJ SOLN: 4.0000 mg | Freq: Once | INTRAMUSCULAR | Status: DC | PRN

## 2016-06-19 MED ORDER — LACTATED RINGERS IV SOLN
INTRAVENOUS | Status: DC
  Administered 2016-06-19: 09:00:00 via INTRAVENOUS

## 2016-06-19 MED ORDER — MIDAZOLAM HCL 5 MG/5ML IJ SOLN
INTRAMUSCULAR | Status: DC | PRN
  Administered 2016-06-19: 2 mg via INTRAVENOUS

## 2016-06-19 SURGICAL SUPPLY — 41 items
BASIN GRAD PLASTIC 32OZ STRL (MISCELLANEOUS) IMPLANT
BLADE SURG 15 STRL LF DISP TIS (BLADE) IMPLANT
BLADE SURG 15 STRL SS (BLADE)
BLOCK BITE GUARD (MISCELLANEOUS) ×4 IMPLANT
CORD BIP STRL DISP 12FT (MISCELLANEOUS) IMPLANT
COVER MAYO STAND STRL (DRAPES) ×4 IMPLANT
COVER TABLE BACK 60X90 (DRAPES) ×4 IMPLANT
CUP MEDICINE 2OZ PLAST GRAD ST (MISCELLANEOUS) IMPLANT
DRAPE HEAD BAR (DRAPES) ×4 IMPLANT
DRAPE SHEET LG 3/4 BI-LAMINATE (DRAPES) ×4 IMPLANT
DRESSING TELFA 4X3 1S ST N-ADH (GAUZE/BANDAGES/DRESSINGS) ×4 IMPLANT
ELECT CAUTERY BLADE TIP 2.5 (TIP)
ELECT CAUTERY NEEDLE 2.0 MIC (NEEDLE) ×4 IMPLANT
ELECTRODE CAUTERY BLDE TIP 2.5 (TIP) IMPLANT
GAUZE SPONGE 4X4 12PLY STRL (GAUZE/BANDAGES/DRESSINGS) IMPLANT
GLOVE PI ULTRA LF STRL 7.5 (GLOVE) ×4 IMPLANT
GLOVE PI ULTRA NON LATEX 7.5 (GLOVE) ×4
KIT ROOM TURNOVER OR (KITS) ×4 IMPLANT
LIQUID BAND (GAUZE/BANDAGES/DRESSINGS) IMPLANT
MARKER SKIN DUAL TIP RULER LAB (MISCELLANEOUS) IMPLANT
NEEDLE 18GX1X1/2 (RX/OR ONLY) (NEEDLE) IMPLANT
NEEDLE FILTER BLUNT 18X 1/2SAF (NEEDLE)
NEEDLE FILTER BLUNT 18X1 1/2 (NEEDLE) IMPLANT
NEEDLE HYPO 25GX1X1/2 BEV (NEEDLE) ×4 IMPLANT
NS IRRIG 500ML POUR BTL (IV SOLUTION) ×4 IMPLANT
PACK DRAPE NASAL/ENT (PACKS) IMPLANT
PAD GROUND ADULT SPLIT (MISCELLANEOUS) IMPLANT
PATTIES SURGICAL .5 X.5 (GAUZE/BANDAGES/DRESSINGS) ×4 IMPLANT
PENCIL ELECTRO HAND CTR (MISCELLANEOUS) IMPLANT
SOL PREP PVP 2OZ (MISCELLANEOUS)
SOLUTION PREP PVP 2OZ (MISCELLANEOUS) IMPLANT
SPONGE XRAY 4X4 16PLY STRL (MISCELLANEOUS) IMPLANT
STRAP BODY AND KNEE 60X3 (MISCELLANEOUS) ×8 IMPLANT
SUCTION FRAZIER TIP 10 FR DISP (SUCTIONS) IMPLANT
SUT PROLENE 5 0 P 3 (SUTURE) IMPLANT
SUT VIC AB 4-0 RB1 27 (SUTURE)
SUT VIC AB 4-0 RB1 27X BRD (SUTURE) IMPLANT
SYRINGE 10CC LL (SYRINGE) IMPLANT
TOWEL OR 17X26 4PK STRL BLUE (TOWEL DISPOSABLE) ×4 IMPLANT
TUBING CONN 6MMX3.1M (TUBING) ×2
TUBING SUCTION CONN 0.25 STRL (TUBING) ×2 IMPLANT

## 2016-06-19 NOTE — Transfer of Care (Signed)
Immediate Anesthesia Transfer of Care Note  Patient: Kent [name]  Procedure(s) Performed: Procedure(s) with comments: LARYNGOSCOPY (N/A) EXCISION OF TONGUE LESION (Left) - diabetic - insulin and oral meds - not using either currently  Patient Location: PACU  Anesthesia Type: General  Level of Consciousness: awake, alert  and patient cooperative  Airway and Oxygen Therapy: Patient Spontanous Breathing and Patient connected to supplemental oxygen  Post-op Assessment: Post-op Vital signs reviewed, Patient's Cardiovascular Status Stable, Respiratory Function Stable, Patent Airway and No signs of Nausea or vomiting  Post-op Vital Signs: Reviewed and stable  Complications: No apparent anesthesia complications

## 2016-06-19 NOTE — Addendum Note (Signed)
Addendum  created 06/19/16 0946 by Mayme Genta, CRNA   Anesthesia Intra LDAs edited, LDA properties accepted

## 2016-06-19 NOTE — H&P (Signed)
  H&P has been reviewed and patient reexamined, and no changes necessary. To be downloaded later.

## 2016-06-19 NOTE — Op Note (Addendum)
06/19/2016  8:58 AM    [name], Kent Side  [id number]   Pre-Op Dx:  Left tongue base/retromolar trigone lesion, status post chemotherapy and radiation therapy for the left neck metastasis  Post-op Dx: Same  Proc: Direct microlaryngoscopy, biopsy of left tongue base, biopsy left retromolar trigone   Surg:  [name]  Anes:  GOT  EBL:  Minimal  Comp:  None  Findings:  Thickness in the left posterior lateral tongue with an ulcerative area near the posterior lateral tongue. There is also an area of erosion at the retromolar trigone where the tongue attaches. Rest the tongue feels soft. And no other lesions were noted  Procedure: The patient was given general anesthesia by oral endotracheal intubation. A Dedo laryngoscope was used for visualization the hypopharynx and larynx. The patient is edentulous. The piriform sinuses are clear. The epiglottis has some scarring and thickening postradiation. The vocal cords are pearly white the larynx looks normal. The post-arytenoid area is clear with no sign of any lesions. The left tongue base hasn't also were where it attaches to the retromolar trigone. A few come slightly anterior to that on the lateral posterior tongue wall there is a another area of firmness and ulceration here. This was visualized under endoscopic magnification and pictures were taken of these prior to biopsy. 2 separate biopsies were taken, 1 from the left lateral posterior tongue and the second from the retromolar trigone. He was minimal bleeding from these areas. The second biopsy from the Retromolar trigone felt more granular. Both specimens were sent for permanent section.  There is minimal ooze. The patient was awakened and taken to the recovery room in satisfactory condition. There were no operative complications.  Dispo:   To PACU to be discharged home  Plan:  To follow-up in the office on Wednesday of next week to go over the path report and decide upon a further  treatment plan.  [name]  06/19/2016 8:58 AM  Copy to Dr Loura Pardon Mike Gip

## 2016-06-19 NOTE — Anesthesia Procedure Notes (Signed)
Procedure Name: Intubation Date/Time: 06/19/2016 8:36 AM Performed by: Mayme Genta Pre-anesthesia Checklist: Patient identified, Emergency Drugs available, Suction available, Patient being monitored and Timeout performed Patient Re-evaluated:Patient Re-evaluated prior to inductionOxygen Delivery Method: Circle system utilized Preoxygenation: Pre-oxygenation with 100% oxygen Intubation Type: IV induction Ventilation: Two handed mask ventilation required Laryngoscope Size: Miller and 3 Grade View: Grade I Tube type: MLT Tube size: 6.0 mm Number of attempts: 1 Placement Confirmation: ETT inserted through vocal cords under direct vision,  positive ETCO2 and breath sounds checked- equal and bilateral Tube secured with: Tape Dental Injury: Teeth and Oropharynx as per pre-operative assessment

## 2016-06-19 NOTE — Anesthesia Preprocedure Evaluation (Signed)
Anesthesia Evaluation  Patient identified by MRN, date of birth, ID band Patient awake    Reviewed: Allergy & Precautions, H&P , NPO status , Patient's Chart, lab work & pertinent test results, reviewed documented beta blocker date and time   Airway Mallampati: II  TM Distance: >3 FB Neck ROM: full    Dental no notable dental hx.    Pulmonary neg pulmonary ROS,    Pulmonary exam normal breath sounds clear to auscultation       Cardiovascular Exercise Tolerance: Good hypertension,  Rhythm:regular Rate:Normal     Neuro/Psych negative neurological ROS  negative psych ROS   GI/Hepatic negative GI ROS, Neg liver ROS,   Endo/Other  diabetes  Renal/GU CRFnegative Renal ROS  negative genitourinary   Musculoskeletal   Abdominal   Peds  Hematology negative hematology ROS (+)   Anesthesia Other Findings Stage IV SCCa base of tongue, left tonsil  Reproductive/Obstetrics negative OB ROS                             Anesthesia Physical Anesthesia Plan  ASA: III  Anesthesia Plan: General   Post-op Pain Management:    Induction:   Airway Management Planned:   Additional Equipment:   Intra-op Plan:   Post-operative Plan:   Informed Consent: I have reviewed the patients History and Physical, chart, labs and discussed the procedure including the risks, benefits and alternatives for the proposed anesthesia with the patient or authorized representative who has indicated his/her understanding and acceptance.   Dental Advisory Given  Plan Discussed with: CRNA  Anesthesia Plan Comments:         Anesthesia Quick Evaluation

## 2016-06-19 NOTE — Anesthesia Postprocedure Evaluation (Signed)
Anesthesia Post Note  Patient: Kent [name]  Procedure(s) Performed: Procedure(s) (LRB): LARYNGOSCOPY (N/A) EXCISION OF TONGUE LESION (Left)  Patient location during evaluation: PACU Anesthesia Type: General Level of consciousness: awake and alert Pain management: pain level controlled Vital Signs Assessment: post-procedure vital signs reviewed and stable Respiratory status: spontaneous breathing, nonlabored ventilation, respiratory function stable and patient connected to nasal cannula oxygen Cardiovascular status: blood pressure returned to baseline and stable Postop Assessment: no signs of nausea or vomiting Anesthetic complications: no    Alisa Graff

## 2016-06-20 ENCOUNTER — Encounter: Payer: Self-pay | Admitting: Otolaryngology

## 2016-06-22 NOTE — Progress Notes (Signed)
[facility] day:  06/23/2016  Chief Complaint: Kent [name] is a 61 y.o. 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  HPI:  The patient was last seen in the medical oncology clinic on 05/09/2016.  At that time, he remained fatigued, but much improved after recent hospitalization at [facility].  He was using excess Tylenol and ibuprofen for left sided oral pain.  He had dry gangrene of the second right toe.  His oral intake had improved.  He had gained 7 pounds in the past 3 months.    PET scan on 05/29/2016 revealed asymmetric hypermetabolism posterior left hilum.  There was mild left cervical lymphadenopathy with hypermetabolism, qualitatively decreased when comparing back to 09/17/2015 exam.  There were tiny bilateral pulmonary nodules, too small to detect on PET imaging.   We discussed follow-up with ENT.  He saw Dr. Kathyrn Sheriff on 06/19/2016.  He underwent laryngoscopy and biopsy of the left tongue base and left retromalar trigone.  Findings revealed thickness in the left posterior lateral tongue with an ulcerative area near the posterior lateral tongue. There was also an area of erosion at the retromolar trigone where the tongue attaches.  No other lesions were noted.  Biopsies of the left tongue base revealed candidiasis with reactive atypia. Retromolar triangle revealed squamous mucosa with candidiasis, ulceration and changes due to radiation therapy.  Symptomatically, he states his pain is "not bad now".  His mouth is no longer irritated. He's been using viscous lidocaine. Spicy foods make his mouth is tender.   Past Medical History:  Diagnosis Date  . Arthritis   . Diabetes mellitus without complication ([place])   . Hyperlipidemia   . Hypertension   . Obesity   . Osteomyelitis ([place])    From pt chart  . Renal insufficiency    CKD noted on chart  . Staph infection 05/29/2016    Nov 2017. Staph infection in LT shoulder requiring debridement and grafts  . Status post debridement   . 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 Tongue cancer ([place])    Stage IV squamous cell carcinoma of the base of the tongue/left tonsil    Past Surgical History:  Procedure Laterality Date  . EXCISION OF TONGUE LESION Left 06/19/2016   Procedure: EXCISION OF TONGUE LESION;  Surgeon: Margaretha Sheffield, MD;  Location: [place];  Service: ENT;  Laterality: Left;  diabetic - insulin and oral meds - not using either currently  . gastric tumor removed  2010  . I&D EXTREMITY Left 01/09/2016   Procedure: IRRIGATION AND DEBRIDEMENT EXTREMITY;  Surgeon: Robert Bellow, MD;  Location: [facility];  Service: General;  Laterality: Left;  . LARYNGOSCOPY N/A 06/19/2016   Procedure: LARYNGOSCOPY;  Surgeon: Margaretha Sheffield, MD;  Location: [place];  Service: ENT;  Laterality: N/A;  . PERIPHERAL VASCULAR CATHETERIZATION Right 12/31/2015   Procedure: Lower Extremity Angiography;  Surgeon: Algernon Huxley, MD;  Location: [place] [facility];  Service: Cardiovascular;  Laterality: Right;  . tongue mass biopsy      Family History  Problem Relation Age of Onset  . Heart disease Mother   . Hypertension Father   . Cancer Sister        stomach  . Heart disease Brother        MI  . Cancer Daughter   . Cancer Son  testicular  . ADD / ADHD Sister     Social History:  reports that he has never smoked. He has never used smokeless tobacco. He reports that he does not drink alcohol or use drugs.  He smokes a pipe and cigar rarely (once every 3 months).  He has a son and daughter.  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  He is back to work full-time x 1 month.  He is accompanied by his wife today.  Allergies: No Known Allergies  Current Medications: Current Outpatient Prescriptions  Medication Sig Dispense Refill  . aspirin EC 81 MG tablet Take 81 mg by  mouth daily as needed for moderate pain.    . bacitracin 500 UNIT/GM ointment Apply topically.    Marland Kitchen HYDROcodone-acetaminophen (NORCO/VICODIN) 5-325 MG tablet     . lidocaine (XYLOCAINE) 2 % solution Use as directed 15 mLs in the mouth or throat every 6 (six) hours as needed for mouth pain. 100 mL 0  . magic mouthwash SOLN Take 5 mLs by mouth 4 (four) times daily as needed for mouth pain. 480 mL 1  . metFORMIN (GLUCOPHAGE) 500 MG tablet Take 500 mg by mouth daily with breakfast.    . neomycin-polymyxin-hydrocortisone (CORTISPORIN) otic solution Place 4 drops into the left ear 4 (four) times daily. 10 mL 0  . potassium chloride SA (K-DUR,KLOR-CON) 20 MEQ tablet 20 meq (1 pill) by mouth twice today then begin 1 pill a day beginning 05/20/2016 20 tablet 1  . cetirizine (ZYRTEC) 10 MG tablet Take 1 tablet (10 mg total) by mouth daily. (Patient not taking: Reported on 05/09/2016) 30 tablet 11  . fluticasone (FLONASE) 50 MCG/ACT nasal spray Place 2 sprays into both nostrils daily. (Patient not taking: Reported on 05/09/2016) 16 g 6  . insulin glargine (LANTUS) 100 UNIT/ML injection Inject 0.2 mLs (20 Units total) into the skin daily. (Patient not taking: Reported on 05/09/2016) 10 mL 11  . lisinopril (PRINIVIL,ZESTRIL) 10 MG tablet Take 10 mg by mouth.    Marland Kitchen lisinopril (PRINIVIL,ZESTRIL) 20 MG tablet      No current facility-administered medications for this visit.     Review of Systems:  GENERAL:  Weak and tired.   Weight down 2 pounds since 01/08/2016. PERFORMANCE STATUS (ECOG):  1 HEENT:  Thick oral secretions. Small left tongue lesion/soreness.  No pain with swallowing.  No runny nose. Lungs:  Intermittent cough. No shortness of breath.  No hemoptysis. Cardiac:  No chest pain, palpitations, orthopnea, or PND.  GI:  Poor oral intake (varies from day to day).  No nausea, vomiting, diarrhea, constipation, melena or hematochezia.  GU:  No urgency, frequency, dysuria, or hematuria.  Musculoskeletal:  Left  shoulder healed.  No muscle tenderness. Extremities:  Right second toe gangrene, worse per wife.  Per patient, no planned intervention.  No pain. Skin:  Well-healing scars around left shoulder/left axilla. No rashes or other skin changes. Neuro:  No headache, numbness or weakness, balance or coordination issues. Endocrine:  Diabetes.  No thyroid issues, hot flashes or night sweats. Psych:  No mood changes, depression or anxiety. Pain:  Left jaw pain. No throat pain.  Review of systems:  All other systems reviewed and found to be negative.  Physical Exam: Blood pressure 155/98, pulse 89, temperature (!) 96.6 F (35.9 C), temperature source Tympanic, resp. rate 18, weight 175 lb 13.1 oz (79.8 kg). GENERAL:  Well appearing gentleman sitting comfortably in the exam room in no acute  distress. MENTAL STATUS:  Alert and oriented to person, place and time. HEAD:  Wearing a golf cap.  Short gray hair.  Goatee.  Normocephalic, atraumatic, face symmetric, no Cushingoid features. EYES:  Blue eyes.  Pupils equal round and reactive to light and accomodation.  No conjunctivitis or scleral icterus. ENT:  Oropharynx healing.  No thrush. No ulcers or breakdown. NECK:  Hyperpigmentation left neck with decreased adenopathy s/p radiation.   RESPIRATORY:  Clear to auscultation without rales, wheezes or rhonchi. CARDIOVASCULAR:  Tachycardia.  Regular rhythm.  No murmur, rub or gallop. ABDOMEN:  Soft, non-tender, with active bowel sounds, and no hepatosplenomegaly.  No masses. SKIN:  Left shoulder s/p debridement and skin graft.  No erythema or induration. EXTREMITIES:  No numbness or tingling.  Tip of right second toe black with slight extension (dry) with base of erythema.  Left shoulder with decreased range of motion.   LYMPH NODES: Soft 2 cm left neck adenopathy (? scar) in field of radiation.  No palpable supraclavicular, axillary or inguinal adenopathy  NEUROLOGICAL: Unremarkable. PSYCH:  Appropriate.      No visits with results within 3 Day(s) from this visit.  Latest known visit with results is:  Admission on 06/19/2016, Discharged on 06/19/2016  Component Date Value Ref Range Status  . Glucose-Capillary 06/19/2016 96  65 - 99 mg/dL Final  . SURGICAL PATHOLOGY 06/19/2016    Final                   Value:Surgical Pathology CASE: [id number] PATIENT: Kent [name] Surgical Pathology Report     SPECIMEN SUBMITTED: A. Tongue base, left; biopsy B. Retro molar triagle, left; biopsy  CLINICAL HISTORY: Status post chemoradiation for stage IVb (T2 N3 M0) squamous cell carcinoma the base of tongue  PRE-OPERATIVE DIAGNOSIS: C 49.0  POST-OPERATIVE DIAGNOSIS: Left tongue base lesion, left retro molar triangle lesion     DIAGNOSIS: A. TONGUE BASE, LEFT; BIOPSY: - CANDIDIASIS WITH REACTIVE ATYPIA. - PASD STAIN CONFIRMS.  B. RETROMOLAR TRIANGLE, LEFT; BIOPSY: - SQUAMOUS MUCOSA WITH CANDIDIASIS, ULCERATION AND CHANGES COMPATIBLE WITH RADIATION THERAPY. - PASD AND PANCYTOKERATIN STAINS CONFIRM.  Comment: Stain controls worked appropriately.  GROSS DESCRIPTION:  A. Labeled: left tongue base biopsy  Tissue fragment(s): multiple  Size: aggregate, 1.0 x 0.3 x 0.1 cm  Description: yellow to brown fragments  Entirely submitted in one cassette(s).   BErmalene Postin                         d: left retromolar triangle biopsy  Tissue fragment(s): multiple  Size: aggregate, 1.4 x 0.5 x 0.1 cm  Description: tan to brown fragments  Entirely submitted in one cassette(s).     Final Diagnosis performed by Quay Burow, MD.  Electronically signed 06/24/2016 9:12:19AM    The electronic signature indicates that the named Attending Pathologist has evaluated the specimen  Technical component performed at [facility], [address] [place], [place] [address] Lab: [phone] Dir: Darrick Penna. Evette Doffing, MD  Professional component performed at [facility], [facility], [place], [place],  [address] Lab: [phone] Dir: Dellia Nims. Rubinas, MD      Assessment:  Kent [name] is a 61 y.o. 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  Tumor was p16 IHC positive (high risk HPV).  He received 3 cycles of cisplatin (10/22/2015 - 12/04/2015) with concurrent radiation.  Radiation completed on 12/21/2015.  CT soft tissue  neck on 08/17/2015 revealed a slowly progressive over years, infiltrative, deep 4.1 x 5.4 cm LEFT parotid mass with widespread ipsilateral bulky adenopathy.  PET scan at [facility] on 09/17/2015 revealed left base of tongue malignancy corresponding to asymmetric enhancement identified on the neck CT with hypermetabolic extensive conglomerate left cervical adenopathy (level II-IV) extending from the level of the angle of the mandible down inferiorly to the level of the cricoid cartilage.  There was clustered subcentimeter left level IB submandibular nodes with mild FDG activity.  There was no evidence of metastatic disease.  PET scan on 05/29/2016 revealed asymmetric hypermetabolism posterior left hilum.  There was mild left cervical lymphadenopathy with hypermetabolism, qualitatively decreased when comparing back to 09/17/2015 exam.  There were tiny bilateral pulmonary nodules, too small to detect on PET imaging.   Laryngoscopy and biopsy of the left tongue base and left retromalar trigone on 06/19/2016 revealed no evidence of malignancy.  Biopsies of the left tongue base noted candidiasis with reactive atypia. Retromolar triangle revealed squamous mucosa with candidiasis, ulceration and changes due to radiation therapy.  Alpha gal IgE was 1.21 (< 0.35) c/w IgE antibodies to galactose-alpha-1,3 galactose (oligosaccharide part on Fab portion of the cetuximab heavy chain) and suggestive of potential cetuximab reaction.  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  Pathologic stage was T4NxM0.  He took imatinib for 1 year.  He has a history of recurrent hyponatremia. He has received daily IVF with electrolyte supplementation every day during clinic.    He was admitted overnight to [facility] on 11/15/2015 for hyponatremia (sodium 122).  He received IVF overnight.  He was admitted to [facility] from 12/08/2015 - 12/09/2015 for hydration and electrolyte replacement. Sodium was 126.  He was readmitted to [facility] from 12/09/2015 - 12/12/2015 secondary to nausea, vomiting, and minimal oral intake.  He received IVF with electrolyte replacement and anti-emetics.  A PICC line was placed for continuation of fluids in the outpatient department.  It has since been removed.  He was admitted to [facility] from 12/28/2015 - 12/31/2015 with dehydration and hyponatremia.  He received IVF.  He was discovered to have dry gangrene of the right second toe.  Angiogram revealed good perfusion. He was to follow-up with Dr. Lucky Cowboy in the outpatient department.  He was admitted to [facility] from 01/08/2016 - 01/10/2016 with necrotizing fasciitis of the left shoulder.  Blood cultures grew staph aureus.  He was treated with vancomycin, Zosyn, and clindamycin. He  underwent debridement of the left shoulder and wound VAC placement on 01/09/2016.  Intra-operative wound cultures on 11/20/207 revealed MSSA, pan sensitive.    [facility] was transferred [facility] wound care.  He underwent a second debridement on 01/11/2016.  He underwent fasciocutaneous pectoral flap to the left shoulder with layered wound closure of the posterior shoulder and split thickness skin graft to the pectoralis major on 01/17/2016.  Bone biopsy revealed ragged necrotic bone with associated acute inflammation c/w acute osteomyelitis.  TEE revealed an AV vegetation versus Lambl's excrescence.  He was transitioned to linezolid due to vancomycin  shortage.  He also received clindamycin and Zosyn.  He was treated with at least 4 weeks of antibiotics for presumed septic arthritis.  Symptomatically, he has improved with marked improvement in oral pain.  Exam is unremarkable.  Plan: 1.  Discuss interval laryngoscopy.  Biopsies revealed no evidence of malignancy. 2.  Discuss PET scan imaging.  Significance of asymmetric left posterior hilum and tiny pulmonary nodules unclear.  Discuss follow-up chest CT. 3.  Chest CT in 6 months- f/u nodules. 4.  RTC in 6 months for MD assessment (coordinate with Dr. Olena Leatherwood appt), labs (CBC with diff, CMP, Mg), and review chest CT.   Lequita Asal, MD 06/23/2016, 12:05 PM

## 2016-06-23 ENCOUNTER — Encounter: Payer: Self-pay | Admitting: Hematology and Oncology

## 2016-06-23 ENCOUNTER — Encounter: Payer: Self-pay | Admitting: Radiation Oncology

## 2016-06-23 ENCOUNTER — Inpatient Hospital Stay: Payer: Managed Care, Other (non HMO) | Attending: Hematology and Oncology | Admitting: Hematology and Oncology

## 2016-06-23 ENCOUNTER — Ambulatory Visit
Admission: RE | Admit: 2016-06-23 | Discharge: 2016-06-23 | Disposition: A | Discharge status: Home / Self Care | Payer: Managed Care, Other (non HMO) | Source: Ambulatory Visit | Attending: Radiation Oncology | Admitting: Radiation Oncology

## 2016-06-23 VITALS — BP 166/99 | HR 89 | Temp 96.6°F | Resp 18 | Wt 175.8 lb

## 2016-06-23 DIAGNOSIS — Z9221 Personal history of antineoplastic chemotherapy: Secondary | ICD-10-CM | POA: Insufficient documentation

## 2016-06-23 DIAGNOSIS — I129 Hypertensive chronic kidney disease with stage 1 through stage 4 chronic kidney disease, or unspecified chronic kidney disease: Secondary | ICD-10-CM | POA: Diagnosis not present

## 2016-06-23 DIAGNOSIS — Z800 Family history of malignant neoplasm of digestive organs: Secondary | ICD-10-CM | POA: Diagnosis not present

## 2016-06-23 DIAGNOSIS — Z85028 Personal history of other malignant neoplasm of stomach: Secondary | ICD-10-CM | POA: Diagnosis not present

## 2016-06-23 DIAGNOSIS — M129 Arthropathy, unspecified: Secondary | ICD-10-CM | POA: Diagnosis not present

## 2016-06-23 DIAGNOSIS — C07 Malignant neoplasm of parotid gland: Secondary | ICD-10-CM

## 2016-06-23 DIAGNOSIS — Z809 Family history of malignant neoplasm, unspecified: Secondary | ICD-10-CM | POA: Diagnosis not present

## 2016-06-23 DIAGNOSIS — E119 Type 2 diabetes mellitus without complications: Secondary | ICD-10-CM | POA: Diagnosis not present

## 2016-06-23 DIAGNOSIS — E669 Obesity, unspecified: Secondary | ICD-10-CM

## 2016-06-23 DIAGNOSIS — Z923 Personal history of irradiation: Secondary | ICD-10-CM | POA: Insufficient documentation

## 2016-06-23 DIAGNOSIS — R591 Generalized enlarged lymph nodes: Secondary | ICD-10-CM | POA: Diagnosis not present

## 2016-06-23 DIAGNOSIS — R918 Other nonspecific abnormal finding of lung field: Secondary | ICD-10-CM | POA: Insufficient documentation

## 2016-06-23 DIAGNOSIS — B370 Candidal stomatitis: Secondary | ICD-10-CM | POA: Diagnosis not present

## 2016-06-23 DIAGNOSIS — Z79899 Other long term (current) drug therapy: Secondary | ICD-10-CM | POA: Insufficient documentation

## 2016-06-23 DIAGNOSIS — Z794 Long term (current) use of insulin: Secondary | ICD-10-CM | POA: Insufficient documentation

## 2016-06-23 DIAGNOSIS — Z7982 Long term (current) use of aspirin: Secondary | ICD-10-CM | POA: Insufficient documentation

## 2016-06-23 DIAGNOSIS — E785 Hyperlipidemia, unspecified: Secondary | ICD-10-CM | POA: Diagnosis not present

## 2016-06-23 DIAGNOSIS — Z8739 Personal history of other diseases of the musculoskeletal system and connective tissue: Secondary | ICD-10-CM | POA: Diagnosis not present

## 2016-06-23 DIAGNOSIS — K1379 Other lesions of oral mucosa: Secondary | ICD-10-CM

## 2016-06-23 DIAGNOSIS — N189 Chronic kidney disease, unspecified: Secondary | ICD-10-CM

## 2016-06-23 DIAGNOSIS — C01 Malignant neoplasm of base of tongue: Secondary | ICD-10-CM

## 2016-06-23 DIAGNOSIS — K117 Disturbances of salivary secretion: Secondary | ICD-10-CM | POA: Diagnosis not present

## 2016-06-23 DIAGNOSIS — E871 Hypo-osmolality and hyponatremia: Secondary | ICD-10-CM | POA: Diagnosis not present

## 2016-06-23 DIAGNOSIS — R5383 Other fatigue: Secondary | ICD-10-CM | POA: Insufficient documentation

## 2016-06-23 DIAGNOSIS — Z85810 Personal history of malignant neoplasm of tongue: Secondary | ICD-10-CM | POA: Insufficient documentation

## 2016-06-23 DIAGNOSIS — Z8619 Personal history of other infectious and parasitic diseases: Secondary | ICD-10-CM | POA: Diagnosis not present

## 2016-06-23 NOTE — Progress Notes (Signed)
Patient states he has not taken his BP medication for a couple of days.  BP elevated today - 155/98 HR. Advised patient to start back on his lisinopril.

## 2016-06-23 NOTE — Progress Notes (Signed)
Radiation Oncology Follow up Note  Name: Kent [name]   Date:   06/23/2016 MRN:  [id number] DOB: [birth year]    This 61 y.o. male presents to the clinic today for 5 month follow-up status post bimodality treatment for squamous cell carcinoma the base of tongue stage IVb.  REFERRING PROVIDER: Kathrine Haddock, NP  HPI: Patient is a 61 year old male now out 5 months having completed combined modality treatment to his head and neck for stage IVb (T2 N3 M0) [place] cell carcinoma of the base of tongue. He had a large conglomerate of left neck adenopathy. He is seen today in routine follow-up is doing fairly well recently had some ulcerations in his mouth excised. Pathology from those lesions are still pending. He recently had a PET CT scan showing marked resolution of left cervical adenopathy with mild hypermetabolic activity still present. He also tiny pulmonary both nodules too small to be detected on PET/CT. He denies having no head and neck pain or dysphagia. He has very little xerostomia.  COMPLICATIONS OF TREATMENT: none  FOLLOW UP COMPLIANCE: keeps appointments   PHYSICAL EXAM:  BP (!) 166/99   Pulse 89   Temp (!) 96.6 F (35.9 C)   Resp 18   Wt 175 lb 13.1 oz (79.8 kg)   BMI 27.54 kg/m  Oral cavity is clear minimal xerostomia is noted. Indirect mirror examination shows upper clear vallecula and base of tongue within normal limits. Neck still shows some persistent scarring in the left mid cervical cervical chain. This corresponds to the large area of disease prior to treatment. Right neck and bilateral supraclavicular regions are within normal limits. Well-developed well-nourished patient in NAD. HEENT reveals PERLA, EOMI, discs not visualized.  Oral cavity is clear. No oral mucosal lesions are identified. Neck is clear without evidence of cervical or supraclavicular adenopathy. Lungs are clear to A&P. Cardiac examination is essentially unremarkable with regular rate and rhythm without  murmur rub or thrill. Abdomen is benign with no organomegaly or masses noted. Motor sensory and DTR levels are equal and symmetric in the upper and lower extremities. Cranial nerves II through XII are grossly intact. Proprioception is intact. No peripheral adenopathy or edema is identified. No motor or sensory levels are noted. Crude visual fields are within normal range.  RADIOLOGY RESULTS: PET CT scan is reviewed and compatible with the above-stated findings  PLAN: Present time patient is doing well he's had an excellent response to treatment. My suggestion would be to continue to observe at this time and possibly repeat PET CT scan in 6 months. He continues close follow-up care with ENT. Should he have evidence of persistent disease in his left neck may benefit from neck dissection. I have asked to see him back in 6 months for follow-up. Patient is to call sooner with any concerns.  I would like to take this opportunity to thank you for allowing me to participate in the care of your patient.Armstead Peaks., MD

## 2016-06-24 LAB — SURGICAL PATHOLOGY

## 2016-07-12 DIAGNOSIS — R918 Other nonspecific abnormal finding of lung field: Secondary | ICD-10-CM | POA: Insufficient documentation

## 2016-08-06 ENCOUNTER — Other Ambulatory Visit: Payer: Self-pay | Admitting: Unknown Physician Specialty

## 2016-08-06 NOTE — Telephone Encounter (Signed)
Ms Kent [name] called and stated the pt needed a refill on his BP medication sent to [facility] court

## 2016-08-06 NOTE — Telephone Encounter (Signed)
2 different lisinopril dosages in chart. Called to ask wife which dosage patient was taking and patient's wife stated that she was not at home so she did not know. Patient's wife asked for me to call over to [place] and I did. Kent at the pharmacy stated that the most recent prescription was for the lisinopril 20 mg. Will send refill request to provider.

## 2016-08-07 MED ORDER — LISINOPRIL 20 MG PO TABS: 20.0000 mg | ORAL_TABLET | Freq: Every day | ORAL | 1 refills | Status: DC

## 2016-08-12 ENCOUNTER — Ambulatory Visit (INDEPENDENT_AMBULATORY_CARE_PROVIDER_SITE_OTHER): Payer: Managed Care, Other (non HMO) | Admitting: Unknown Physician Specialty

## 2016-08-12 ENCOUNTER — Encounter: Payer: Self-pay | Admitting: Unknown Physician Specialty

## 2016-08-12 ENCOUNTER — Telehealth: Payer: Self-pay

## 2016-08-12 VITALS — BP 142/80 | HR 90 | Temp 97.6°F | Ht 66.5 in | Wt 186.0 lb

## 2016-08-12 DIAGNOSIS — E119 Type 2 diabetes mellitus without complications: Secondary | ICD-10-CM

## 2016-08-12 DIAGNOSIS — I10 Essential (primary) hypertension: Secondary | ICD-10-CM

## 2016-08-12 LAB — BAYER DCA HB A1C WAIVED: HB A1C (BAYER DCA - WAIVED): 5.3 % (ref <7.0)

## 2016-08-12 NOTE — Telephone Encounter (Signed)
Called and let patient know about medication. Scheduled 3 month f/up for 11/17/16.

## 2016-08-12 NOTE — Assessment & Plan Note (Addendum)
Not to goal.  Improved to a SBP of 144 after sitting in the office.  Improving and being monitored at Oncology.  Suspect intermittent compliance

## 2016-08-12 NOTE — Assessment & Plan Note (Signed)
Hgb A1C 5.3%.  Stop Metformin

## 2016-08-12 NOTE — Progress Notes (Signed)
BP (!) 142/80   Pulse 90   Temp 97.6 F (36.4 C)   Ht 5' 6.5" (1.689 m)   Wt 186 lb (84.4 kg)   SpO2 100%   BMI 29.57 kg/m    Subjective:    Patient ID: Kent [name], male    DOB: 20-Apr-1955, 61 y.o.   MRN: [id number]  HPI: Kent [name] is a 61 y.o. male  Chief Complaint  Patient presents with  . Diabetes    pt states last eye exam in chart is correct, pt states he stopped his insuling because he lost weight and sugars have been low   . Hypertension    Diabetes: Stopped insulin due to above.  Using medications without difficulties No hypoglycemic episodes No hyperglycemic episodes Feet problems: Blood Sugars averaging: eye exam within last year Last Hgb A1C: 6.5   Hypertension  Reviewed notes from Oncology in which BP was elevated after not taking his Lisinopril for a couple of days Average home BPs not checking  Using medication without problems or lightheadedness No chest pain with exertion or shortness of breath No Edema  Elevated Cholesterol Using medications without problems No Muscle aches  Diet: Exercise: Lost weight due to chemotherapy and complications.     Relevant past medical, surgical, family and social history reviewed and updated as indicated. Interim medical history since our last visit reviewed. Allergies and medications reviewed and updated.  Review of Systems  Per HPI unless specifically indicated above     Objective:    BP (!) 142/80   Pulse 90   Temp 97.6 F (36.4 C)   Ht 5' 6.5" (1.689 m)   Wt 186 lb (84.4 kg)   SpO2 100%   BMI 29.57 kg/m   Wt Readings from Last 3 Encounters:  08/12/16 186 lb (84.4 kg)  06/23/16 175 lb 13.1 oz (79.8 kg)  06/19/16 172 lb (78 kg)    Physical Exam  Constitutional: He is oriented to person, place, and time. He appears well-developed and well-nourished. No distress.  HENT:  Head: Normocephalic and atraumatic.  Eyes: Conjunctivae and lids are normal. Right eye exhibits no discharge. Left  eye exhibits no discharge. No scleral icterus.  Neck: Normal range of motion. Neck supple. No JVD present. Carotid bruit is not present.  Cardiovascular: Normal rate, regular rhythm and normal heart sounds.   Pulmonary/Chest: Effort normal and breath sounds normal. No respiratory distress.  Abdominal: Normal appearance. There is no splenomegaly or hepatomegaly.  Musculoskeletal: Normal range of motion.  Neurological: He is alert and oriented to person, place, and time.  Skin: Skin is warm, dry and intact. No rash noted. No pallor.  Psychiatric: He has a normal mood and affect. His behavior is normal. Judgment and thought content normal.       Assessment & Plan:   Problem List Items Addressed This Visit      Unprioritized   RESOLVED: Controlled type 2 diabetes mellitus without complication ([place]) - Primary    Hgb A1C 5.3%.  Stop Metformin      Relevant Medications   metFORMIN (GLUCOPHAGE) 1000 MG tablet   Other Relevant Orders   Bayer DCA Hb A1c Waived   Hypertension    Not to goal.  Improved to a SBP of 144 after sitting in the office.  Improving and being monitored at Oncology.  Suspect intermittent compliance          Follow up plan: Return in about 3 months (around 11/12/2016).

## 2016-08-12 NOTE — Telephone Encounter (Signed)
-----   Message from Kent Haddock, NP sent at 08/12/2016  2:16 PM EDT ----- Tell him to stop Metformin and check in 3 months

## 2016-11-17 ENCOUNTER — Ambulatory Visit: Payer: Self-pay | Admitting: Unknown Physician Specialty

## 2016-11-27 ENCOUNTER — Ambulatory Visit: Payer: Self-pay | Attending: Radiation Oncology | Admitting: Radiation Oncology

## 2016-12-23 ENCOUNTER — Ambulatory Visit
Admission: RE | Admit: 2016-12-23 | Discharge: 2016-12-23 | Disposition: A | Discharge status: Home / Self Care | Payer: Self-pay | Source: Ambulatory Visit | Attending: Hematology and Oncology | Admitting: Hematology and Oncology

## 2016-12-23 DIAGNOSIS — K1379 Other lesions of oral mucosa: Secondary | ICD-10-CM | POA: Insufficient documentation

## 2016-12-23 DIAGNOSIS — C01 Malignant neoplasm of base of tongue: Secondary | ICD-10-CM | POA: Insufficient documentation

## 2016-12-23 DIAGNOSIS — R918 Other nonspecific abnormal finding of lung field: Secondary | ICD-10-CM | POA: Insufficient documentation

## 2016-12-23 IMAGING — CT CT CHEST W/O CM
1 series · 16 of 34 positions shown, 20 images · non-contrast
Comparison: PET-CT [DATE]

CLINICAL DATA: Head and neck carcinoma. Stage IV. Lung nodule
follow-up.

EXAM:
CT CHEST WITHOUT CONTRAST
TECHNIQUE: Multidetector CT imaging of the chest was performed following the
standard protocol without IV contrast.

[Series 2: thorax · axial · 0.80mm/px · z∈[-639,-305]mm · 16 of 188 slices shown, 20 images]
[im 14/188  mediastinal]
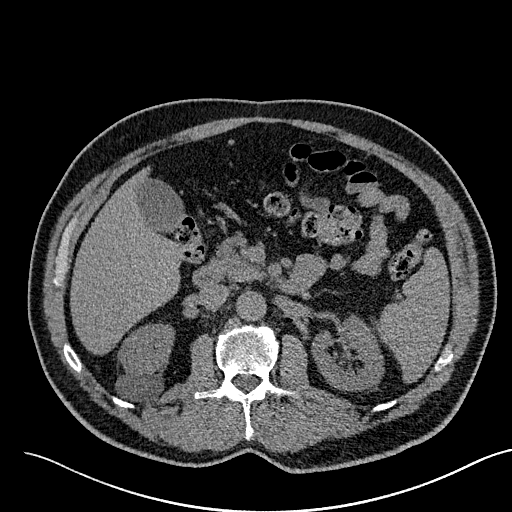
[im 14/188  lung]
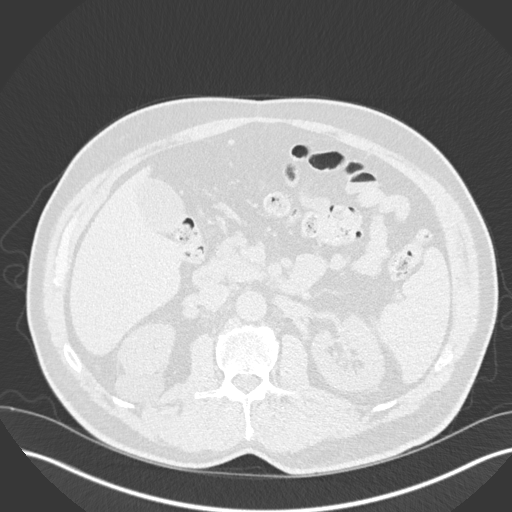
[im 28/188  lung]
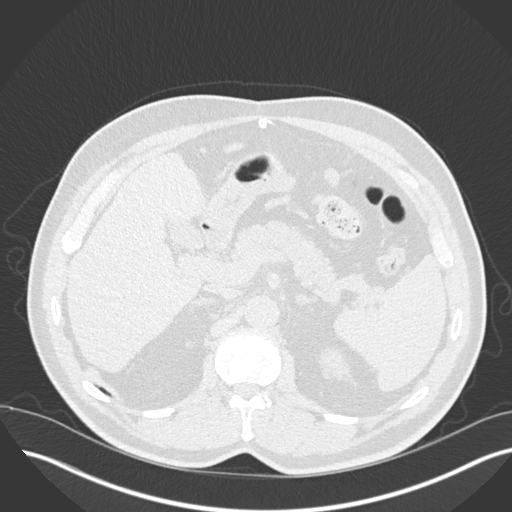
[im 38/188  lung]
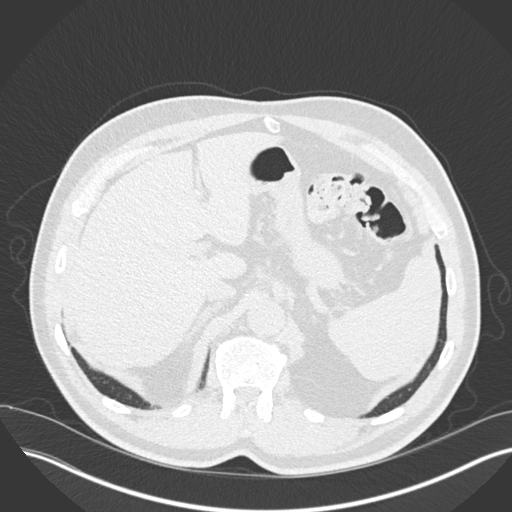
[im 49/188  lung]
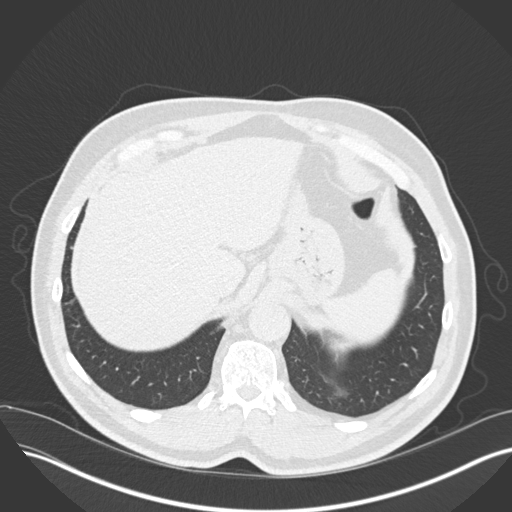
[im 63/188  mediastinal]
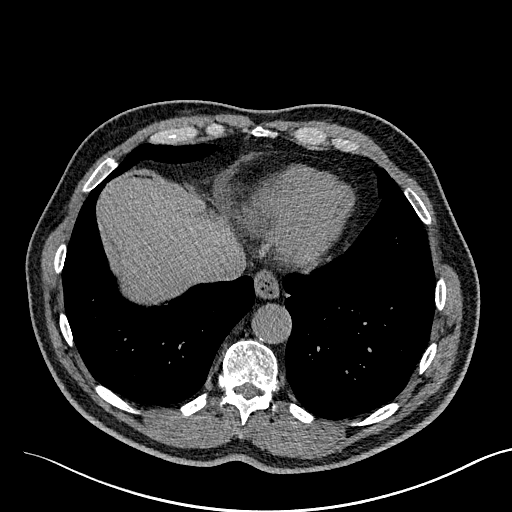
[im 63/188  lung]
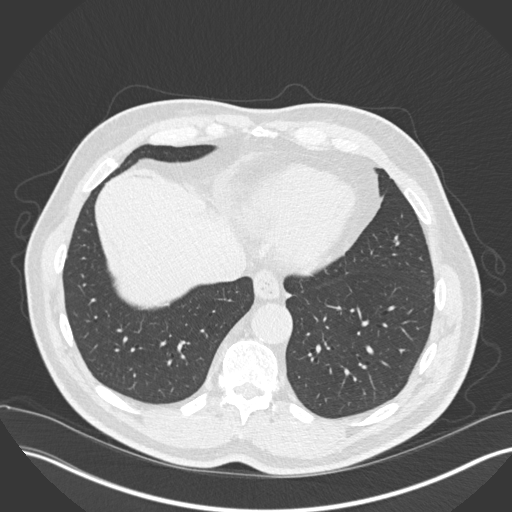
[im 75/188  lung]
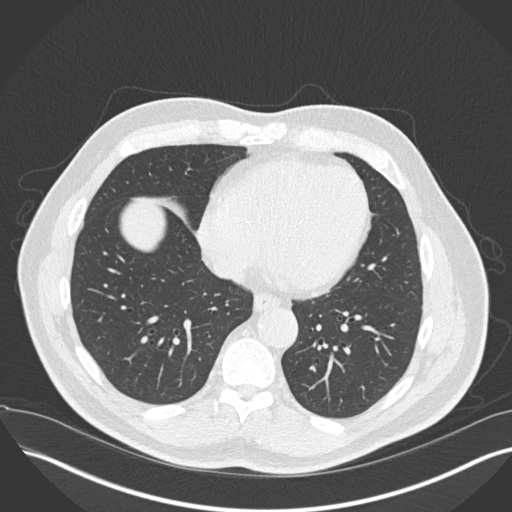
[im 84/188  lung]
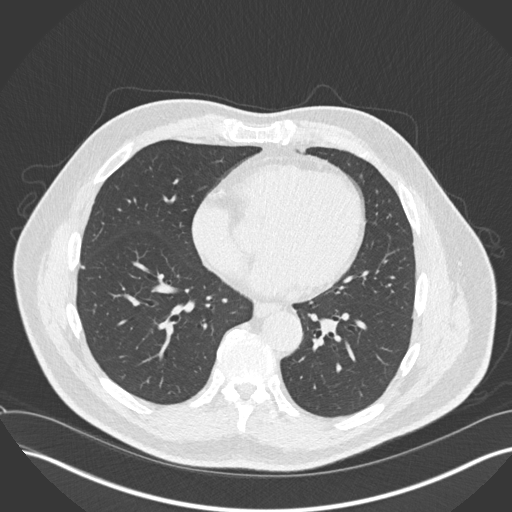
[im 91/188  lung]
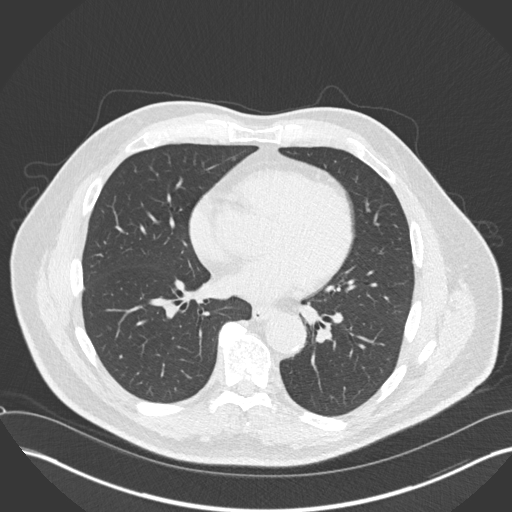
[im 99/188  mediastinal]
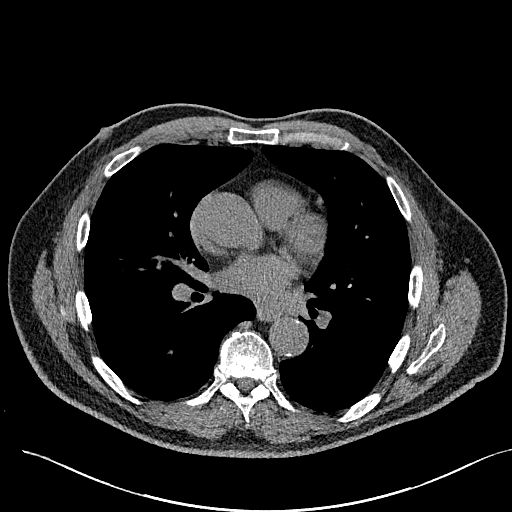
[im 99/188  lung]
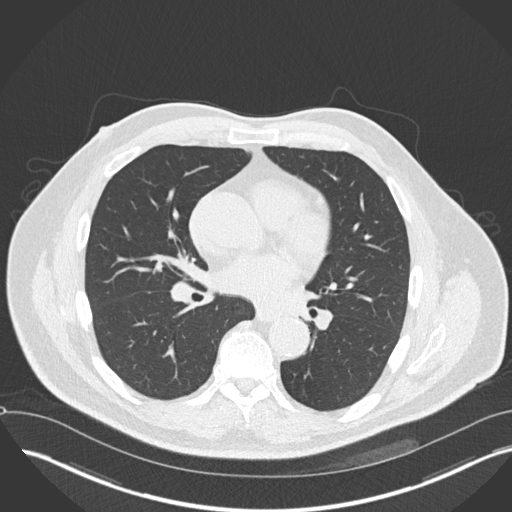
[im 111/188  lung]
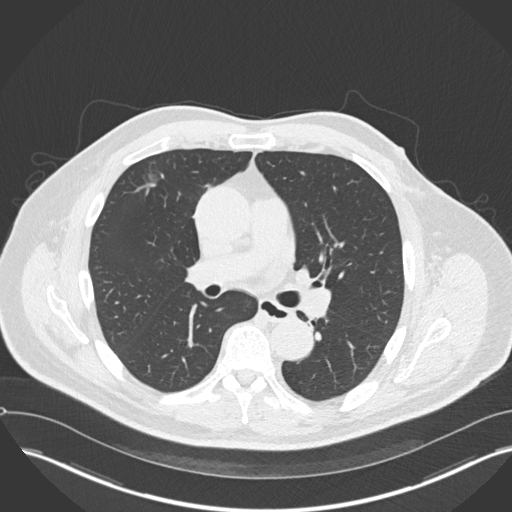
[im 118/188  lung]
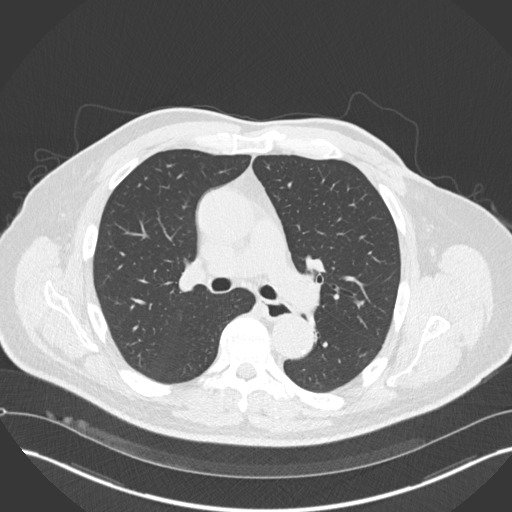
[im 132/188  lung]
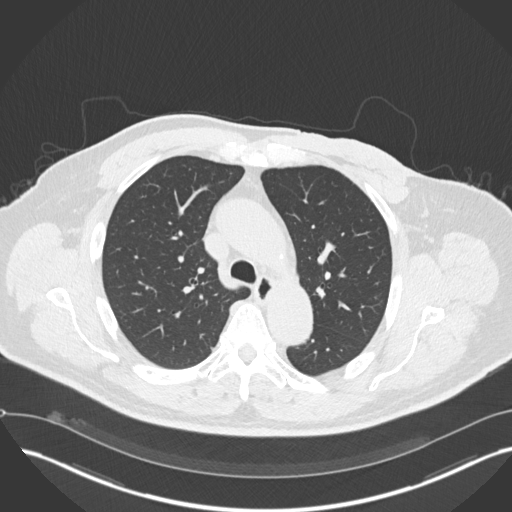
[im 146/188  mediastinal]
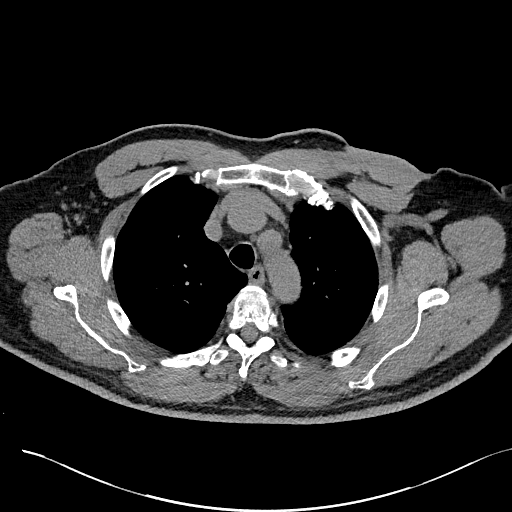
[im 146/188  lung]
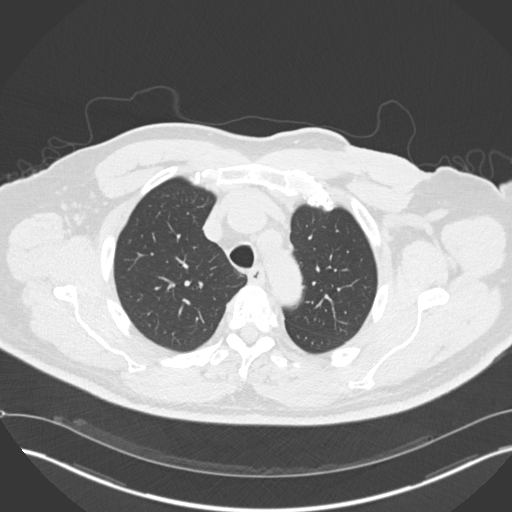
[im 153/188  lung]
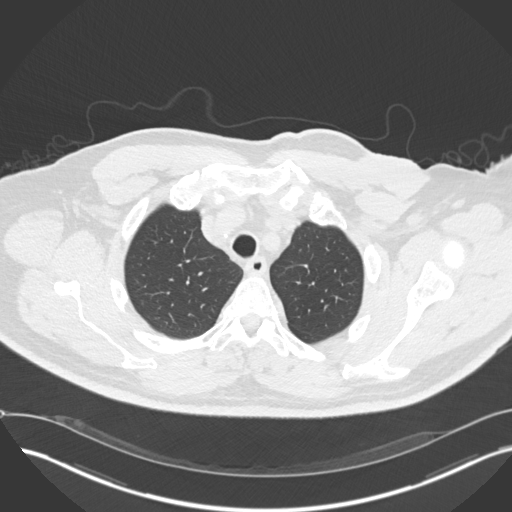
[im 167/188  lung]
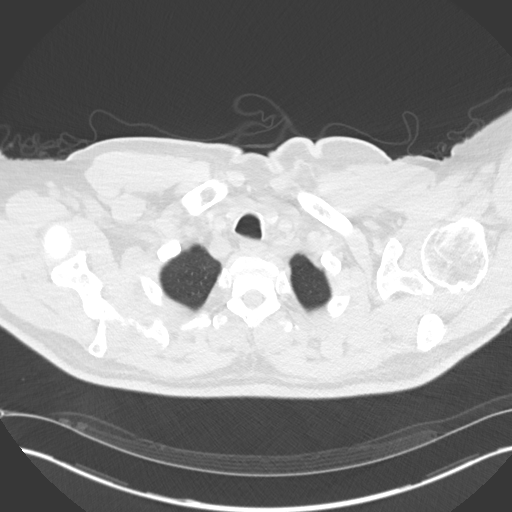
[im 181/188  lung]
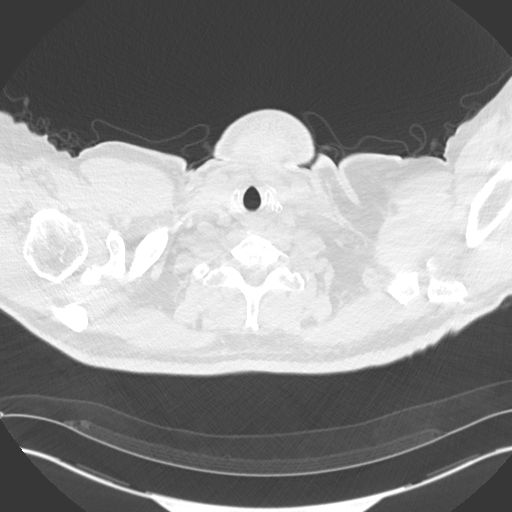

[16 of 34 positions shown; findings below may reference images not displayed]

FINDINGS: Cardiovascular: No significant vascular findings. Normal heart size.
No pericardial effusion.

Mediastinum/Nodes: No axillary supraclavicular adenopathy. No
mediastinal hilar adenopathy.

Lungs/Pleura: Small nodules along the LEFT oblique fissure (image
86, series 3) are unchanged. Subpleural nodule in the LEFT lower
lobe measuring 3 mm (image 112, series 3) is unchanged. No new
pulmonary nodularity.

Upper Abdomen: Limited view of the liver, kidneys, pancreas are
unremarkable. Normal adrenal glands.

Musculoskeletal: No aggressive osseous lesion.
IMPRESSION: 1. Stable small pulmonary nodules. No evidence of pulmonary
metastasis.
2. No mediastinal adenopathy.

## 2016-12-25 ENCOUNTER — Inpatient Hospital Stay: Payer: Self-pay | Admitting: Hematology and Oncology

## 2016-12-25 ENCOUNTER — Inpatient Hospital Stay: Payer: Self-pay

## 2016-12-25 NOTE — Progress Notes (Deleted)
[facility] day:  12/25/2016   Chief Complaint: Ramondo Dietze Champney is a 61 y.o. 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  HPI:  The patient was last seen in the medical oncology clinic on 06/23/2016.  At that time, his oral pain had improved.  Exam was unremarkable.  PEt scan had revealed uptake in the left tongue base .  Laryngoscopy and biopsy of the left tongue base and left retromalar trigone on 06/19/2016 revealed no evidence of malignancy.  Chest CT on 12/23/2016 revealed stable small bilateral pulmonary nodules.  There was no mediastinal adenopathy.   Past Medical History:  Diagnosis Date  . Arthritis   . Diabetes mellitus without complication ([place])   . Hyperlipidemia   . Hypertension   . Obesity   . Osteomyelitis ([place])    From pt chart  . Renal insufficiency    CKD noted on chart  . Staph infection 05/29/2016   Nov 2017. Staph infection in LT shoulder requiring debridement and grafts  . Status post debridement   . 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 Tongue cancer ([place])    Stage IV squamous cell carcinoma of the base of the tongue/left tonsil    Past Surgical History:  Procedure Laterality Date  . EXCISION OF TONGUE LESION Left 06/19/2016   Procedure: EXCISION OF TONGUE LESION;  Surgeon: Margaretha Sheffield, MD;  Location: [place];  Service: ENT;  Laterality: Left;  diabetic - insulin and oral meds - not using either currently  . gastric tumor removed  2010  . I&D EXTREMITY Left 01/09/2016   Procedure: IRRIGATION AND DEBRIDEMENT EXTREMITY;  Surgeon: Robert Bellow, MD;  Location: [facility];  Service: General;  Laterality: Left;  . LARYNGOSCOPY N/A 06/19/2016   Procedure: LARYNGOSCOPY;  Surgeon: Margaretha Sheffield, MD;  Location: [place];  Service: ENT;  Laterality: N/A;  . PERIPHERAL VASCULAR CATHETERIZATION  Right 12/31/2015   Procedure: Lower Extremity Angiography;  Surgeon: Algernon Huxley, MD;  Location: [facility];  Service: Cardiovascular;  Laterality: Right;  . tongue mass biopsy      Family History  Problem Relation Age of Onset  . Heart disease Mother   . Hypertension Father   . Cancer Sister        stomach  . Heart disease Brother        MI  . Cancer Daughter   . Cancer Son        testicular  . ADD / ADHD Sister     Social History:  reports that he has never smoked. He has never used smokeless tobacco. He reports that he does not drink alcohol or use drugs.  He smokes a pipe and cigar rarely (once every 3 months).  He has a son and daughter.  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  He is back to work full-time x 1 month.  He is accompanied by his wife today.  Allergies: No Known Allergies  Current Medications: Current Outpatient Prescriptions  Medication Sig Dispense Refill  . aspirin EC 81 MG tablet Take 81 mg by mouth daily as needed for moderate pain.    . bacitracin 500 UNIT/GM ointment Apply topically.    . cetirizine (ZYRTEC) 10 MG tablet Take 1 tablet (10 mg total) by mouth daily. (Patient  not taking: Reported on 05/09/2016) 30 tablet 11  . fluticasone (FLONASE) 50 MCG/ACT nasal spray Place 2 sprays into both nostrils daily. (Patient not taking: Reported on 05/09/2016) 16 g 6  . HYDROcodone-acetaminophen (NORCO/VICODIN) 5-325 MG tablet Take 1 tablet by mouth every 4 (four) hours as needed.     Marland Kitchen lisinopril (PRINIVIL,ZESTRIL) 20 MG tablet Take 1 tablet (20 mg total) by mouth daily. 90 tablet 1  . magic mouthwash SOLN Take 5 mLs by mouth 4 (four) times daily as needed for mouth pain. (Patient not taking: Reported on 08/12/2016) 480 mL 1  . metFORMIN (GLUCOPHAGE) 1000 MG tablet Take 1,000 mg by mouth daily.    . potassium chloride SA (K-DUR,KLOR-CON) 20 MEQ tablet 20 meq (1 pill) by mouth twice today then begin 1 pill a day beginning 05/20/2016  20 tablet 1   No current facility-administered medications for this visit.     Review of Systems:  GENERAL:  Weak and tired.   Weight down 2 pounds since 01/08/2016. PERFORMANCE STATUS (ECOG):  1 HEENT:  Thick oral secretions. Small left tongue lesion/soreness.  No pain with swallowing.  No runny nose. Lungs:  Intermittent cough. No shortness of breath.  No hemoptysis. Cardiac:  No chest pain, palpitations, orthopnea, or PND.  GI:  Poor oral intake (varies from day to day).  No nausea, vomiting, diarrhea, constipation, melena or hematochezia.  GU:  No urgency, frequency, dysuria, or hematuria.  Musculoskeletal:  Left shoulder healed.  No muscle tenderness. Extremities:  Right second toe gangrene, worse per wife.  Per patient, no planned intervention.  No pain. Skin:  Well-healing scars around left shoulder/left axilla. No rashes or other skin changes. Neuro:  No headache, numbness or weakness, balance or coordination issues. Endocrine:  Diabetes.  No thyroid issues, hot flashes or night sweats. Psych:  No mood changes, depression or anxiety. Pain:  Left jaw pain. No throat pain.  Review of systems:  All other systems reviewed and found to be negative.  Physical Exam: Blood pressure 155/98, pulse 89, temperature (!) 96.6 F (35.9 C), temperature source Tympanic, resp. rate 18, weight 175 lb 13.1 oz (79.8 kg). GENERAL:  Well appearing gentleman sitting comfortably in the exam room in no acute distress. MENTAL STATUS:  Alert and oriented to person, place and time. HEAD:  Wearing a golf cap.  Short gray hair.  Goatee.  Normocephalic, atraumatic, face symmetric, no Cushingoid features. EYES:  Blue eyes.  Pupils equal round and reactive to light and accomodation.  No conjunctivitis or scleral icterus. ENT:  Oropharynx healing.  No thrush. No ulcers or breakdown. NECK:  Hyperpigmentation left neck with decreased adenopathy s/p radiation.   RESPIRATORY:  Clear to auscultation without rales,  wheezes or rhonchi. CARDIOVASCULAR:  Tachycardia.  Regular rhythm.  No murmur, rub or gallop. ABDOMEN:  Soft, non-tender, with active bowel sounds, and no hepatosplenomegaly.  No masses. SKIN:  Left shoulder s/p debridement and skin graft.  No erythema or induration. EXTREMITIES:  No numbness or tingling.  Tip of right second toe black with slight extension (dry) with base of erythema.  Left shoulder with decreased range of motion.   LYMPH NODES: Soft 2 cm left neck adenopathy (? scar) in field of radiation.  No palpable supraclavicular, axillary or inguinal adenopathy  NEUROLOGICAL: Unremarkable. PSYCH:  Appropriate.     No visits with results within 3 Day(s) from this visit.  Latest known visit with results is:  Office Visit on 08/12/2016  Component Date Value Ref Range Status  .  Bayer DCA Hb A1c Waived 08/12/2016 5.3  <7.0 % Final   Comment:                                       Diabetic Adult            <7.0                                       Healthy Adult        4.3 - 5.7                                                           (DCCT/NGSP) American Diabetes Association's Summary of Glycemic Recommendations for Adults with Diabetes: Hemoglobin A1c <7.0%. More stringent glycemic goals (A1c <6.0%) may further reduce complications at the cost of increased risk of hypoglycemia.     Assessment:  Juanmanuel Marohl Spike is a 61 y.o. 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  Tumor was p16 IHC positive (high risk HPV).  He received 3 cycles of cisplatin (10/22/2015 - 12/04/2015) with concurrent radiation.  Radiation completed on 12/21/2015.  CT soft tissue neck on 08/17/2015 revealed a slowly progressive over years, infiltrative, deep 4.1 x 5.4 cm LEFT parotid mass with widespread ipsilateral bulky adenopathy.  PET scan at [facility] on 09/17/2015 revealed left base of tongue malignancy corresponding to asymmetric enhancement identified on the  neck CT with hypermetabolic extensive conglomerate left cervical adenopathy (level II-IV) extending from the level of the angle of the mandible down inferiorly to the level of the cricoid cartilage.  There was clustered subcentimeter left level IB submandibular nodes with mild FDG activity.  There was no evidence of metastatic disease.  PET scan on 05/29/2016 revealed asymmetric hypermetabolism posterior left hilum.  There was mild left cervical lymphadenopathy with hypermetabolism, qualitatively decreased when comparing back to 09/17/2015 exam.  There were tiny bilateral pulmonary nodules, too small to detect on PET imaging. There was asymmetric persistent abnormal uptake in the posterior left TONGUE with SUV max 6.3 (addendum).  Laryngoscopy and biopsy of the left tongue base and left retromalar trigone on 06/19/2016 revealed no evidence of malignancy.  Biopsies of the left tongue base noted candidiasis with reactive atypia. Retromolar triangle revealed squamous mucosa with candidiasis, ulceration and changes due to radiation therapy.  Alpha gal IgE was 1.21 (< 0.35) c/w IgE antibodies to galactose-alpha-1,3 galactose (oligosaccharide part on Fab portion of the cetuximab heavy chain) and suggestive of potential cetuximab reaction.  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  Pathologic stage was T4NxM0.  He took imatinib for 1 year.  He has a history of recurrent hyponatremia. He has received daily IVF with electrolyte supplementation every day during clinic.    He was admitted overnight to [facility] on 11/15/2015 for hyponatremia (sodium 122).  He received IVF overnight.  He was admitted to [facility] from 12/08/2015 - 12/09/2015 for hydration and electrolyte replacement. Sodium was 126.  He was readmitted  to [facility] from 12/09/2015 - 12/12/2015 secondary to nausea, vomiting, and minimal oral  intake.  He received IVF with electrolyte replacement and anti-emetics.  A PICC line was placed for continuation of fluids in the outpatient department.  It has since been removed.  He was admitted to [facility] from 12/28/2015 - 12/31/2015 with dehydration and hyponatremia.  He received IVF.  He was discovered to have dry gangrene of the right second toe.  Angiogram revealed good perfusion. He was to follow-up with Dr. Lucky Cowboy in the outpatient department.  He was admitted to [facility] from 01/08/2016 - 01/10/2016 with necrotizing fasciitis of the left shoulder.  Blood cultures grew staph aureus.  He was treated with vancomycin, Zosyn, and clindamycin. He  underwent debridement of the left shoulder and wound VAC placement on 01/09/2016.  Intra-operative wound cultures on 11/20/207 revealed MSSA, pan sensitive.    He was transferred to [facility] for further wound care.  He underwent a second debridement on 01/11/2016.  He underwent fasciocutaneous pectoral flap to the left shoulder with layered wound closure of the posterior shoulder and split thickness skin graft to the pectoralis major on 01/17/2016.  Bone biopsy revealed ragged necrotic bone with associated acute inflammation c/w acute osteomyelitis.  TEE revealed an AV vegetation versus Lambl's excrescence.  He was transitioned to linezolid due to vancomycin shortage.  He also received clindamycin and Zosyn.  He was treated with at least 4 weeks of antibiotics for presumed septic arthritis.  Symptomatically, he has marked improvement in oral pain.  Exam is unremarkable.  Plan: 1.  Labs today:  CBC with diff, CMP, Mg. 2.  Discuss interval chest CT- stable pulmonary nodules. 3.    Discuss interval laryngoscopy.  Biopsies revealed no evidence of malignancy. 2.  Discuss PET scan imaging.  Significance of asymmetric left posterior hilum and tiny pulmonary nodules unclear.  Discuss follow-up chest CT. 3.  Chest CT in 6 months- f/u nodules. 4.  RTC in 6 months for MD  assessment (coordinate with Dr. Olena Leatherwood appt), labs (CBC with diff, CMP, Mg), and review chest CT.   Lequita Asal, MD 06/23/2016, 12:05 PM

## 2016-12-26 ENCOUNTER — Telehealth: Payer: Self-pay | Admitting: Urgent Care

## 2016-12-26 NOTE — Telephone Encounter (Signed)
Patient missed scheduled follow-up appointment on 12/25/2016. Call placed by this NP to review results from CT scan done on 12/23/2016. CT demonstrated small nodules along the left oblique fissure that are unchanged. Subpleural nodule in the left lower lobe measured  3 mm is also unchanged. There was no new pulmonary nodularity adenopathy appreciated on the interval CT scan. Patient was delighted to hear that his interval studies were stable.   Patient reports that he is doing well and denies any B symptoms or interval infections. Patient notes that he is gaining weight and feels "pretty good for the most part".  I questioned patient as to why he did not attend follow-up appointment with Dr. Mike Gip scheduled on 12/25/2016. Patient states that he has no insurance at this time, and refuses to run up a bill that he cannot pay. I discussed patient financial assistance programs available through the hospital, however patient was not interested. He verbalized that he is also not following up with Dr. Baruch Gouty for the same reason. Patient is not seeing ENT any longer citing that he has no symptoms, and "cannot pay them either". His last visit with ENT was last year per his report. Of note, patient does report that he is actively searching for employment that  will offer him benefits that will allow him to return for follow-up evaluations in the future. I encouraged the patient to return a call to the medical oncology clinic should he develop any concerning symptoms such as fever, sweats, significant weight loss, or palpable adenopathy. Patient states, "I will. I am feeling good. I have no problems right now". Dr. Mike Gip and Dr. Baruch Gouty made aware.

## 2017-05-13 ENCOUNTER — Telehealth: Payer: Self-pay

## 2017-05-13 NOTE — Telephone Encounter (Signed)
Yes, please. Does not look like we wrote it

## 2017-05-13 NOTE — Telephone Encounter (Signed)
Received refill fax from pharmacy patient received Sildenafil 04/18/2017 from [facility] but we have not seen this patient since 08/12/2016. This medication is also not it outside medications.  Reply to pharmacy we did not write this Rx?

## 2017-07-28 ENCOUNTER — Emergency Department
Admission: EM | Admit: 2017-07-28 | Discharge: 2017-07-28 | Disposition: A | Discharge status: Home / Self Care | Payer: Self-pay | Attending: Student in an Organized Health Care Education/Training Program | Admitting: Student in an Organized Health Care Education/Training Program

## 2017-07-28 ENCOUNTER — Encounter: Payer: Self-pay | Admitting: Emergency Medicine

## 2017-07-28 DIAGNOSIS — J029 Acute pharyngitis, unspecified: Secondary | ICD-10-CM | POA: Insufficient documentation

## 2017-07-28 DIAGNOSIS — Z85828 Personal history of other malignant neoplasm of skin: Secondary | ICD-10-CM | POA: Insufficient documentation

## 2017-07-28 DIAGNOSIS — J028 Acute pharyngitis due to other specified organisms: Secondary | ICD-10-CM

## 2017-07-28 DIAGNOSIS — N181 Chronic kidney disease, stage 1: Secondary | ICD-10-CM | POA: Insufficient documentation

## 2017-07-28 DIAGNOSIS — N189 Chronic kidney disease, unspecified: Secondary | ICD-10-CM | POA: Insufficient documentation

## 2017-07-28 DIAGNOSIS — J0100 Acute maxillary sinusitis, unspecified: Secondary | ICD-10-CM | POA: Insufficient documentation

## 2017-07-28 DIAGNOSIS — Z79899 Other long term (current) drug therapy: Secondary | ICD-10-CM | POA: Insufficient documentation

## 2017-07-28 DIAGNOSIS — Z7984 Long term (current) use of oral hypoglycemic drugs: Secondary | ICD-10-CM | POA: Insufficient documentation

## 2017-07-28 DIAGNOSIS — E1122 Type 2 diabetes mellitus with diabetic chronic kidney disease: Secondary | ICD-10-CM | POA: Insufficient documentation

## 2017-07-28 DIAGNOSIS — I129 Hypertensive chronic kidney disease with stage 1 through stage 4 chronic kidney disease, or unspecified chronic kidney disease: Secondary | ICD-10-CM | POA: Insufficient documentation

## 2017-07-28 MED ORDER — MAGIC MOUTHWASH W/LIDOCAINE: 5.0000 mL | Freq: Four times a day (QID) | ORAL | 0 refills | Status: DC

## 2017-07-28 MED ORDER — FEXOFENADINE-PSEUDOEPHED ER 60-120 MG PO TB12: 1.0000 | ORAL_TABLET | Freq: Two times a day (BID) | ORAL | 0 refills | Status: DC

## 2017-07-28 MED ORDER — AMOXICILLIN 500 MG PO CAPS: 500.0000 mg | ORAL_CAPSULE | Freq: Three times a day (TID) | ORAL | 0 refills | Status: DC

## 2017-07-28 NOTE — ED Triage Notes (Signed)
Patient presents to the ED for cough, congestion and sore throat for several days.  Patient states, "I feel like my sinuses are draining into my throat making it sore."  Patient reports coughing up green/yellow phlegm.

## 2017-07-28 NOTE — ED Notes (Signed)
See triage note  Presents with cough,nasal congestion and sore throat  States sx's started couple of days ago  No fever  No cough noted at present

## 2017-07-28 NOTE — ED Provider Notes (Signed)
[facility] Emergency Department Provider Note   ____________________________________________   First MD Initiated Contact with Patient 07/28/17 1215     (approximate)  I have reviewed the triage vital signs and the nursing notes.   HISTORY  Chief Complaint Nasal Congestion; Sore Throat; and Cough    HPI Kent [name] is a 62 y.o. male patient complain of sinus congestion thick greenish nasal drainage and a sore throat secondary to postnasal drainage.  Patient states drainage is also causing nonproductive cough.  Patient rates his pain discomfort a 4/10.  Patient describes his pain as "pressure".  No palliative measure for complaint.  Past history of throat cancer treated with chemo and radiation.  Past Medical History:  Diagnosis Date  . Arthritis   . Diabetes mellitus without complication ([place])   . Hyperlipidemia   . Hypertension   . Obesity   . Osteomyelitis ([place])    From pt chart  . Renal insufficiency    CKD noted on chart  . Staph infection 05/29/2016   Nov 2017. Staph infection in LT shoulder requiring debridement and grafts  . Status post debridement   . Stomach cancer ([place])    had tumor removed- [facility]  . 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 Pulmonary nodules 07/12/2016  . H/O necrotizing fasciitis 05/18/2016  . Oral pain of unknown etiology 05/18/2016  . H/O osteomyelitis 05/18/2016  . Sepsis ([place]) 01/08/2016  . Advance care planning 01/07/2016  . Tachycardia 01/07/2016  . Dry gangrene ([place]) 01/04/2016  . Hypokalemia 12/21/2015  . Protein-calorie malnutrition, severe 12/11/2015  . Electrolyte imbalance 12/11/2015  . Intractable nausea and vomiting 12/10/2015  . Dehydration 11/16/2015  . Odynophagia 11/16/2015  . Anemia 11/16/2015  . Thrombocytopenia ([place]) 11/16/2015  . Hyponatremia 11/12/2015  . Hypomagnesemia  11/12/2015  . Cancer associated pain 10/22/2015  . Carcinoma of base of tongue ([place]) 09/10/2015  . Low back pain 04/06/2015  . Hypertension 12/05/2014  . Hypertensive CKD (chronic kidney disease) 12/05/2014  . Hyperlipidemia 12/05/2014  . Class II obesity 12/05/2014  . Erectile dysfunction 12/05/2014  . Nonalcoholic steatohepatitis 56/21/3086    Past Surgical History:  Procedure Laterality Date  . EXCISION OF TONGUE LESION Left 06/19/2016   Procedure: EXCISION OF TONGUE LESION;  Surgeon: Margaretha Sheffield, MD;  Location: [facility];  Service: ENT;  Laterality: Left;  diabetic - insulin and oral meds - not using either currently  . gastric tumor removed  2010  . I&D EXTREMITY Left 01/09/2016   Procedure: IRRIGATION AND DEBRIDEMENT EXTREMITY;  Surgeon: Robert Bellow, MD;  Location: [facility];  Service: General;  Laterality: Left;  . LARYNGOSCOPY N/A 06/19/2016   Procedure: LARYNGOSCOPY;  Surgeon: Margaretha Sheffield, MD;  Location: [facility];  Service: ENT;  Laterality: N/A;  . PERIPHERAL VASCULAR CATHETERIZATION Right 12/31/2015   Procedure: Lower Extremity Angiography;  Surgeon: Algernon Huxley, MD;  Location: [facility];  Service: Cardiovascular;  Laterality: Right;  . tongue mass biopsy      Prior to Admission medications   Medication Sig Start Date End Date Taking? Authorizing Provider  amoxicillin (AMOXIL) 500 MG capsule Take 1 capsule (500 mg total) by mouth 3 (three) times daily. 07/28/17   Sable Feil, PA-C  aspirin EC 81 MG tablet Take 81 mg by mouth daily as needed for moderate pain.  [provider]  bacitracin 500 UNIT/GM ointment Apply topically. 01/22/16   [provider]  cetirizine (ZYRTEC) 10 MG tablet Take 1 tablet (10 mg total) by mouth daily. Patient not taking: Reported on 05/09/2016 02/11/16   Volney American, PA-C  fexofenadine-pseudoephedrine (ALLEGRA-D) 60-120 MG 12 hr tablet Take 1 tablet by mouth 2 (two) times daily.  07/28/17   Sable Feil, PA-C  fluticasone (FLONASE) 50 MCG/ACT nasal spray Place 2 sprays into both nostrils daily. Patient not taking: Reported on 05/09/2016 02/11/16   Volney American, PA-C  HYDROcodone-acetaminophen (NORCO/VICODIN) 5-325 MG tablet Take 1 tablet by mouth every 4 (four) hours as needed.  06/19/16   [provider]  lisinopril (PRINIVIL,ZESTRIL) 20 MG tablet Take 1 tablet (20 mg total) by mouth daily. 08/07/16   Kathrine Haddock, NP  magic mouthwash SOLN Take 5 mLs by mouth 4 (four) times daily as needed for mouth pain. Patient not taking: Reported on 08/12/2016 12/03/15   Lequita Asal, MD  magic mouthwash w/lidocaine SOLN Take 5 mLs by mouth 4 (four) times daily. 07/28/17   Sable Feil, PA-C  metFORMIN (GLUCOPHAGE) 1000 MG tablet Take 1,000 mg by mouth daily. 07/17/16   [provider]  potassium chloride SA (K-DUR,KLOR-CON) 20 MEQ tablet 20 meq (1 pill) by mouth twice today then begin 1 pill a day beginning 05/20/2016 05/19/16   Lequita Asal, MD    Allergies Patient has no known allergies.  Family History  Problem Relation Age of Onset  . Heart disease Mother   . Hypertension Father   . Cancer Sister        stomach  . Heart disease Brother        MI  . Cancer Daughter   . Cancer Son        testicular  . ADD / ADHD Sister     Social History Social History   Tobacco Use  . Smoking status: Never Smoker  . Smokeless tobacco: Never Used  Substance Use Topics  . Alcohol use: No    Alcohol/week: 0.0 oz  . Drug use: No    Review of Systems  Constitutional: No fever/chills Eyes: No visual changes. ENT: No sore throat. Cardiovascular: Denies chest pain. Respiratory: Denies shortness of breath. Gastrointestinal: No abdominal pain.  No nausea, no vomiting.  No diarrhea.  No constipation. Genitourinary: Negative for dysuria. Musculoskeletal: Negative for back pain. Skin: Negative for rash. Neurological: Negative for  headaches, focal weakness or numbness.  Endocrine:Diabetes, hyperlipidemia, and hypertension. Hematological/Lymphatic: Allergic/Immunilogical: **}  ____________________________________________   PHYSICAL EXAM:  VITAL SIGNS: ED Triage Vitals  Enc Vitals Group     BP 07/28/17 1120 (!) 175/105     Pulse Rate 07/28/17 1120 94     Resp 07/28/17 1120 18     Temp 07/28/17 1120 98.4 F (36.9 C)     Temp Source 07/28/17 1120 Oral     SpO2 07/28/17 1120 98 %     Weight 07/28/17 1121 165 lb (74.8 kg)     Height 07/28/17 1121 5\' 7"  (1.702 m)     Head Circumference --      Peak Flow --      Pain Score 07/28/17 1120 4     Pain Loc --      Pain Edu? --      Excl. in [place]? --     Constitutional: Alert and oriented. Well appearing and in no acute distress. Nose: Bilateral maxillary guarding with edematous nasal turbinates.  Mouth/Throat: Mucous membranes are moist.  Oropharynx erythematous with postnasal drainage. Neck: No stridor.  Hematological/Lymphatic/Immunilogical: No cervical lymphadenopathy. Cardiovascular: Normal rate, regular rhythm. Grossly normal heart sounds.  Good peripheral circulation.  Elevated blood pressure Respiratory: Normal respiratory effort.  No retractions. Lungs CTAB. Gastrointestinal: Soft and nontender. No distention. No abdominal bruits. No CVA tenderness. Musculoskeletal: No lower extremity tenderness nor edema.  No joint effusions. Neurologic:  Normal speech and language. No gross focal neurologic deficits are appreciated. No gait instability. Skin:  Skin is warm, dry and intact. No rash noted. Psychiatric: Mood and affect are normal. Speech and behavior are normal.  ____________________________________________   LABS (all labs ordered are listed, but only abnormal results are displayed)  Labs Reviewed - No data to display ____________________________________________  EKG   ____________________________________________  RADIOLOGY  ED MD  interpretation:    Official radiology report(s): No results found.  ____________________________________________   PROCEDURES  Procedure(s) performed: None  Procedures  Critical Care performed: No  ____________________________________________   INITIAL IMPRESSION / ASSESSMENT AND PLAN / ED COURSE  As part of my medical decision making, I reviewed the following data within the electronic MEDICAL RECORD NUMBER   Sinus congestion and sore throat secondary to postnasal drainage.  Patient given discharge care instructions       ____________________________________________   FINAL CLINICAL IMPRESSION(S) / ED DIAGNOSES  Final diagnoses:  Subacute maxillary sinusitis  Pharyngitis due to other organism     ED Discharge Orders        Ordered    amoxicillin (AMOXIL) 500 MG capsule  3 times daily     07/28/17 1228    fexofenadine-pseudoephedrine (ALLEGRA-D) 60-120 MG 12 hr tablet  2 times daily     07/28/17 1228    magic mouthwash w/lidocaine SOLN  4 times daily     07/28/17 1228       Note:  This document was prepared using Dragon voice recognition software and may include unintentional dictation errors.    Sable Feil, PA-C 07/28/17 1229    Merlyn Lot, MD 07/28/17 [phone]

## 2019-06-21 ENCOUNTER — Encounter: Payer: Self-pay | Admitting: Emergency Medicine

## 2019-06-21 ENCOUNTER — Other Ambulatory Visit: Payer: Self-pay

## 2019-06-21 DIAGNOSIS — R42 Dizziness and giddiness: Secondary | ICD-10-CM | POA: Insufficient documentation

## 2019-06-21 DIAGNOSIS — Z5321 Procedure and treatment not carried out due to patient leaving prior to being seen by health care provider: Secondary | ICD-10-CM | POA: Insufficient documentation

## 2019-06-21 LAB — URINALYSIS, COMPLETE (UACMP) WITH MICROSCOPIC
Bacteria, UA: NONE SEEN
Bilirubin Urine: NEGATIVE
Glucose, UA: NEGATIVE mg/dL
Hgb urine dipstick: NEGATIVE
Ketones, ur: NEGATIVE mg/dL
Leukocytes,Ua: NEGATIVE
Nitrite: NEGATIVE
Protein, ur: 100 mg/dL — AB
Specific Gravity, Urine: 1.006 (ref 1.005–1.030)
Squamous Epithelial / LPF: NONE SEEN (ref 0–5)
pH: 7 (ref 5.0–8.0)

## 2019-06-21 LAB — GLUCOSE, CAPILLARY: Glucose-Capillary: 141 mg/dL — ABNORMAL HIGH (ref 70–99)

## 2019-06-21 LAB — CBC
HCT: 38.9 % — ABNORMAL LOW (ref 39.0–52.0)
Hemoglobin: 13.8 g/dL (ref 13.0–17.0)
MCH: 29.1 pg (ref 26.0–34.0)
MCHC: 35.5 g/dL (ref 30.0–36.0)
MCV: 81.9 fL (ref 80.0–100.0)
Platelets: 303 10*3/uL (ref 150–400)
RBC: 4.75 MIL/uL (ref 4.22–5.81)
RDW: 13 % (ref 11.5–15.5)
WBC: 6.8 10*3/uL (ref 4.0–10.5)
nRBC: 0 % (ref 0.0–0.2)

## 2019-06-21 LAB — BASIC METABOLIC PANEL
Anion gap: 8 (ref 5–15)
BUN: 6 mg/dL — ABNORMAL LOW (ref 8–23)
CO2: 26 mmol/L (ref 22–32)
Calcium: 8.7 mg/dL — ABNORMAL LOW (ref 8.9–10.3)
Chloride: 100 mmol/L (ref 98–111)
Creatinine, Ser: 0.94 mg/dL (ref 0.61–1.24)
GFR calc Af Amer: >60 mL/min (ref >60)
GFR calc non Af Amer: >60 mL/min (ref >60)
Glucose, Bld: 153 mg/dL — ABNORMAL HIGH (ref 70–99)
Potassium: 3.7 mmol/L (ref 3.5–5.1)
Sodium: 134 mmol/L — ABNORMAL LOW (ref 135–145)

## 2019-06-21 LAB — TROPONIN I (HIGH SENSITIVITY): Troponin I (High Sensitivity): 6 ng/L (ref <18)

## 2019-06-21 MED ORDER — SODIUM CHLORIDE 0.9% FLUSH: 3.0000 mL | Freq: Once | INTRAVENOUS | Status: DC

## 2019-06-21 NOTE — ED Triage Notes (Addendum)
Pt presents to ED from home with c/o feeling dizzy and weak for the past 3-4 days. Denies falling or nausea. Pt states symptoms worsen when walking around. Pt reports testing positive for COVID last Sunday and states he "is still real tired and sleepy".

## 2019-06-22 ENCOUNTER — Emergency Department: Payer: Self-pay

## 2019-06-22 ENCOUNTER — Inpatient Hospital Stay: Payer: Self-pay

## 2019-06-22 ENCOUNTER — Inpatient Hospital Stay
Admission: EM | Admit: 2019-06-22 | Discharge: 2019-07-05 | DRG: 064 | Disposition: A | Discharge status: Skilled Nursing Facility | Payer: Self-pay | Attending: Hospitalist | Admitting: Hospitalist

## 2019-06-22 ENCOUNTER — Emergency Department
Admission: EM | Admit: 2019-06-22 | Discharge: 2019-06-22 | Disposition: A | Discharge status: Home / Self Care | Payer: Self-pay | Attending: Emergency Medicine | Admitting: Emergency Medicine

## 2019-06-22 ENCOUNTER — Encounter: Payer: Self-pay | Admitting: Emergency Medicine

## 2019-06-22 ENCOUNTER — Other Ambulatory Visit: Payer: Self-pay

## 2019-06-22 DIAGNOSIS — E119 Type 2 diabetes mellitus without complications: Secondary | ICD-10-CM

## 2019-06-22 DIAGNOSIS — I160 Hypertensive urgency: Secondary | ICD-10-CM

## 2019-06-22 DIAGNOSIS — E785 Hyperlipidemia, unspecified: Secondary | ICD-10-CM | POA: Diagnosis present

## 2019-06-22 DIAGNOSIS — J189 Pneumonia, unspecified organism: Secondary | ICD-10-CM

## 2019-06-22 DIAGNOSIS — C49A0 Gastrointestinal stromal tumor, unspecified site: Secondary | ICD-10-CM | POA: Diagnosis present

## 2019-06-22 DIAGNOSIS — I130 Hypertensive heart and chronic kidney disease with heart failure and stage 1 through stage 4 chronic kidney disease, or unspecified chronic kidney disease: Secondary | ICD-10-CM | POA: Diagnosis present

## 2019-06-22 DIAGNOSIS — I5032 Chronic diastolic (congestive) heart failure: Secondary | ICD-10-CM | POA: Diagnosis present

## 2019-06-22 DIAGNOSIS — Z7984 Long term (current) use of oral hypoglycemic drugs: Secondary | ICD-10-CM

## 2019-06-22 DIAGNOSIS — I639 Cerebral infarction, unspecified: Principal | ICD-10-CM | POA: Diagnosis present

## 2019-06-22 DIAGNOSIS — Z79899 Other long term (current) drug therapy: Secondary | ICD-10-CM

## 2019-06-22 DIAGNOSIS — Z7982 Long term (current) use of aspirin: Secondary | ICD-10-CM

## 2019-06-22 DIAGNOSIS — I10 Essential (primary) hypertension: Secondary | ICD-10-CM | POA: Diagnosis present

## 2019-06-22 DIAGNOSIS — C169 Malignant neoplasm of stomach, unspecified: Secondary | ICD-10-CM | POA: Diagnosis present

## 2019-06-22 DIAGNOSIS — E876 Hypokalemia: Secondary | ICD-10-CM | POA: Diagnosis present

## 2019-06-22 DIAGNOSIS — J9601 Acute respiratory failure with hypoxia: Secondary | ICD-10-CM | POA: Diagnosis present

## 2019-06-22 DIAGNOSIS — R531 Weakness: Secondary | ICD-10-CM

## 2019-06-22 DIAGNOSIS — R29898 Other symptoms and signs involving the musculoskeletal system: Secondary | ICD-10-CM | POA: Insufficient documentation

## 2019-06-22 DIAGNOSIS — R29704 NIHSS score 4: Secondary | ICD-10-CM | POA: Diagnosis present

## 2019-06-22 DIAGNOSIS — U071 COVID-19: Secondary | ICD-10-CM | POA: Diagnosis present

## 2019-06-22 DIAGNOSIS — J1282 Pneumonia due to coronavirus disease 2019: Secondary | ICD-10-CM | POA: Diagnosis present

## 2019-06-22 HISTORY — DX: Hypertensive urgency: I16.0

## 2019-06-22 HISTORY — DX: Weakness: R53.1

## 2019-06-22 LAB — GLUCOSE, CAPILLARY
Glucose-Capillary: 147 mg/dL — ABNORMAL HIGH (ref 70–99)
Glucose-Capillary: 197 mg/dL — ABNORMAL HIGH (ref 70–99)
Glucose-Capillary: 127 mg/dL — ABNORMAL HIGH (ref 70–99)
Glucose-Capillary: 144 mg/dL — ABNORMAL HIGH (ref 70–99)

## 2019-06-22 LAB — HEPATIC FUNCTION PANEL
ALT: 20 U/L (ref 0–44)
AST: 27 U/L (ref 15–41)
Albumin: 3.9 g/dL (ref 3.5–5.0)
Alkaline Phosphatase: 87 U/L (ref 38–126)
Bilirubin, Direct: 0.3 mg/dL — ABNORMAL HIGH (ref 0.0–0.2)
Indirect Bilirubin: 0.7 mg/dL (ref 0.3–0.9)
Total Bilirubin: 1 mg/dL (ref 0.3–1.2)
Total Protein: 7.2 g/dL (ref 6.5–8.1)

## 2019-06-22 LAB — BASIC METABOLIC PANEL
Anion gap: 10 (ref 5–15)
BUN: 5 mg/dL — ABNORMAL LOW (ref 8–23)
CO2: 28 mmol/L (ref 22–32)
Calcium: 8.6 mg/dL — ABNORMAL LOW (ref 8.9–10.3)
Chloride: 98 mmol/L (ref 98–111)
Creatinine, Ser: 0.85 mg/dL (ref 0.61–1.24)
GFR calc Af Amer: >60 mL/min (ref >60)
GFR calc non Af Amer: >60 mL/min (ref >60)
Glucose, Bld: 178 mg/dL — ABNORMAL HIGH (ref 70–99)
Potassium: 3.2 mmol/L — ABNORMAL LOW (ref 3.5–5.1)
Sodium: 136 mmol/L (ref 135–145)

## 2019-06-22 LAB — APTT: aPTT: <24 seconds — ABNORMAL LOW (ref 24–36)

## 2019-06-22 LAB — FERRITIN: Ferritin: 360 ng/mL — ABNORMAL HIGH (ref 24–336)

## 2019-06-22 LAB — URINE DRUG SCREEN, QUALITATIVE (ARMC ONLY)
Amphetamines, Ur Screen: NOT DETECTED
Barbiturates, Ur Screen: NOT DETECTED
Benzodiazepine, Ur Scrn: NOT DETECTED
Cannabinoid 50 Ng, Ur ~~LOC~~: NOT DETECTED
Cocaine Metabolite,Ur ~~LOC~~: NOT DETECTED
MDMA (Ecstasy)Ur Screen: NOT DETECTED
Methadone Scn, Ur: NOT DETECTED
Opiate, Ur Screen: NOT DETECTED
Phencyclidine (PCP) Ur S: NOT DETECTED
Tricyclic, Ur Screen: NOT DETECTED

## 2019-06-22 LAB — RESPIRATORY PANEL BY RT PCR (FLU A&B, COVID)
Influenza A by PCR: NEGATIVE
Influenza B by PCR: NEGATIVE
SARS Coronavirus 2 by RT PCR: POSITIVE — AB

## 2019-06-22 LAB — FIBRIN DERIVATIVES D-DIMER (ARMC ONLY): Fibrin derivatives D-dimer (ARMC): 1439 ng/mL (FEU) — ABNORMAL HIGH (ref 0.00–499.00)

## 2019-06-22 LAB — PROCALCITONIN: Procalcitonin: <0.1 ng/mL

## 2019-06-22 LAB — BRAIN NATRIURETIC PEPTIDE: B Natriuretic Peptide: 116 pg/mL — ABNORMAL HIGH (ref 0.0–100.0)

## 2019-06-22 LAB — TROPONIN I (HIGH SENSITIVITY)
Troponin I (High Sensitivity): 17 ng/L (ref <18)
Troponin I (High Sensitivity): 9 ng/L (ref <18)

## 2019-06-22 LAB — HEPATITIS B SURFACE ANTIGEN: Hepatitis B Surface Ag: NONREACTIVE

## 2019-06-22 LAB — TRIGLYCERIDES: Triglycerides: 80 mg/dL (ref <150)

## 2019-06-22 LAB — PROTIME-INR
INR: 1 (ref 0.8–1.2)
Prothrombin Time: 12.6 seconds (ref 11.4–15.2)

## 2019-06-22 LAB — LACTATE DEHYDROGENASE: LDH: 198 U/L — ABNORMAL HIGH (ref 98–192)

## 2019-06-22 LAB — LACTIC ACID, PLASMA: Lactic Acid, Venous: 1.4 mmol/L (ref 0.5–1.9)

## 2019-06-22 LAB — HIV ANTIBODY (ROUTINE TESTING W REFLEX): HIV Screen 4th Generation wRfx: NONREACTIVE

## 2019-06-22 LAB — C-REACTIVE PROTEIN: CRP: 1 mg/dL — ABNORMAL HIGH (ref <1.0)

## 2019-06-22 LAB — FIBRINOGEN: Fibrinogen: 482 mg/dL — ABNORMAL HIGH (ref 210–475)

## 2019-06-22 LAB — ETHANOL: Alcohol, Ethyl (B): <10 mg/dL (ref <10)

## 2019-06-22 IMAGING — CT CT ANGIO NECK
2 of 3 series · 8 of 33 positions shown · IV contrast (omnipaque)
Comparison: Brain MRI performed earlier the same day [DATE],
chest radiograph [DATE], PET-CT [DATE], neck CT [DATE]
COMPARISON: Brain MRI performed earlier the same day [DATE],
chest radiograph [DATE], PET-CT [DATE], neck CT [DATE]

Addendum:
CLINICAL DATA: Stroke, follow-up.

EXAM:
CT ANGIOGRAPHY HEAD AND NECK
TECHNIQUE: Multidetector CT imaging of the head and neck was performed using
the standard protocol during bolus administration of intravenous
contrast. Multiplanar CT image reconstructions and MIPs were
obtained to evaluate the vascular anatomy. Carotid stenosis
measurements (when applicable) are obtained utilizing NASCET
criteria, using the distal internal carotid diameter as the
denominator.
CONTRAST:  75mL OMNIPAQUE IOHEXOL 350 MG/ML SOLN

[Series 4: head wo · axial · 0.44mm/px · z∈[-37,+78]mm · 7 of 29 slices shown]
[im 3/29  soft-tissue]
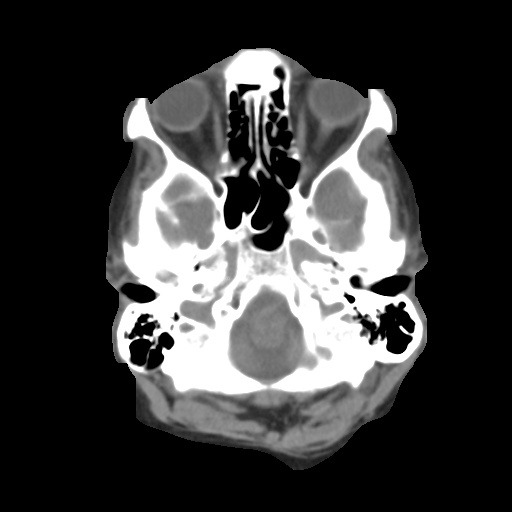
[im 7/29  bone]
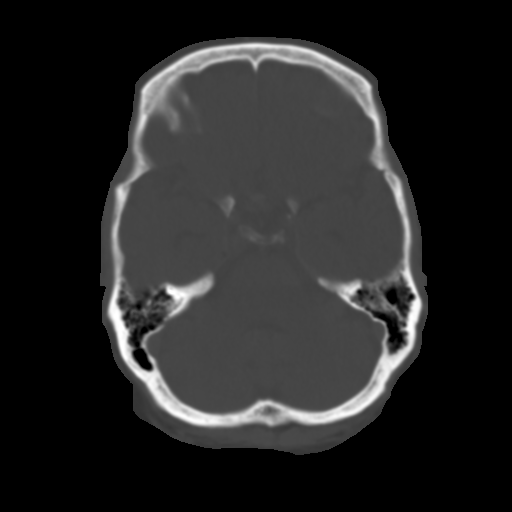
[im 11/29  soft-tissue]
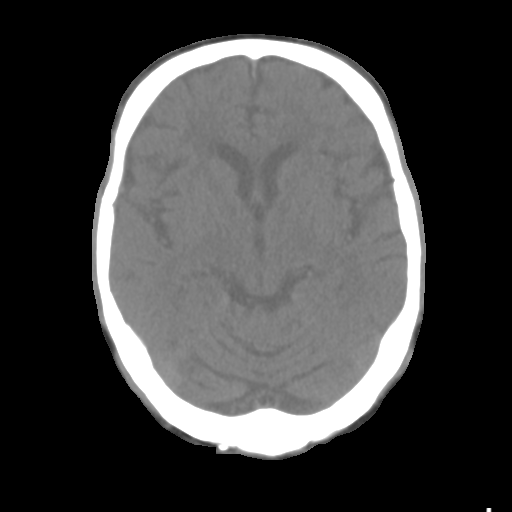
[im 16/29  bone]
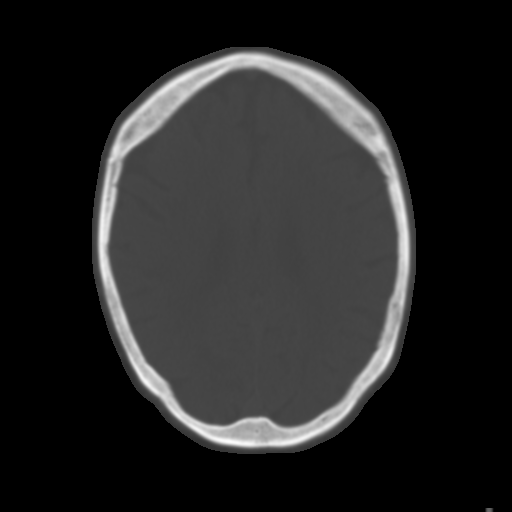
[im 18/29  soft-tissue]
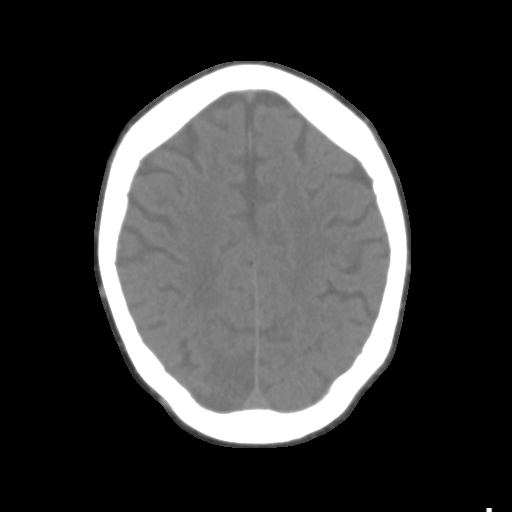
[im 22/29  bone]
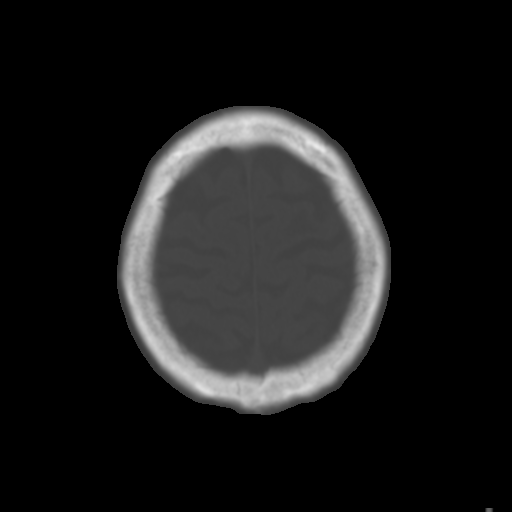
[im 26/29  soft-tissue]
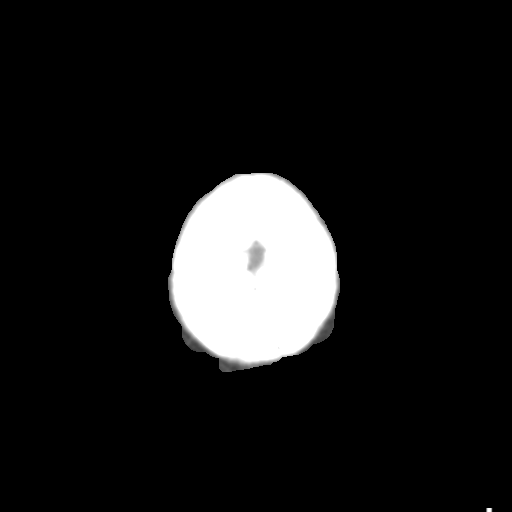

[Series 7: sagittal soft tissue · sagittal · 0.29mm/px · 1 of 55 slices shown]
[im 28/55  soft-tissue]
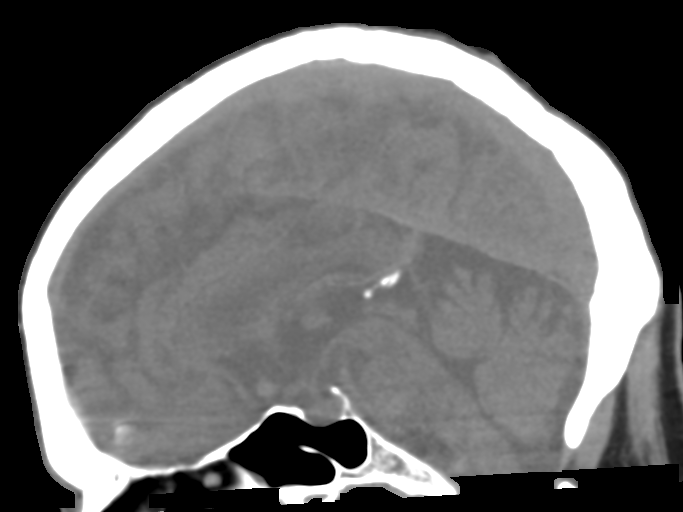

[8 of 33 positions shown; findings below may reference images not displayed]

FINDINGS: CT HEAD FINDINGS

Brain:

Acute/early subacute infarction changes within the right occipital
lobe have not significantly changed in extent. Additional foci of
acute/early subacute infarction within the medial and posterior
right temporal lobe and right thalamus, some of which was better
appreciated on same-day brain MRI. Subtle petechial hemorrhage
within the right occipital lobe was also better appreciated on this
prior exam.

No interval infarct is identified. Redemonstrated chronic cortically
based infarct within the posterior left frontal lobe. Stable
background moderate chronic small vessel ischemic disease and mild
generalized parenchymal atrophy. There is no evidence of
intracranial mass. No midline shift or extra-axial fluid collection

Vascular: Reported below.

Skull: Normal. Negative for fracture or focal lesion.

Sinuses: Mild ethmoid and maxillary sinus mucosal thickening. No
significant mastoid effusion.

Orbits: No acute abnormality.

Review of the MIP images confirms the above findings

CTA NECK FINDINGS

Aortic arch: Common origin of the innominate and left common carotid
arteries. The visualized aortic arch is unremarkable. No significant
innominate or proximal subclavian artery stenosis.

Right carotid system: CCA and ICA patent within the neck without
significant stenosis. Mild mixed plaque within the carotid
bifurcation

Left carotid system: CCA and ICA patent within the neck without
significant stenosis. Mild soft plaque within the CCA. Prominent
tortuosity of the proximal to mid ICA.

Vertebral arteries: The vertebral arteries are patent within the
neck bilaterally. Soft plaque results in moderate/severe stenosis at
the origin of the left vertebral artery.

Skeleton: No acute bony abnormality or aggressive osseous lesion.

Other neck: Ill ill-defined soft tissue prominence with irregular
enhancement and regions of internal hypodensity within the left
neck, closely related to the left sternocleidomastoid muscle. This
region is incompletely assessed on this arterial phase scan, but
measures approximately 3.9 x 1.9 x 5.4 cm (AP x TV x CC) (for
instance as seen on series 4, image 160 and series 5, image 161).
Findings are highly suspicious for a residual/recurrent tumor
deposit or nodal conglomerate. Surrounding soft tissue infiltration
is nonspecific but may reflect treatment related change.

Upper chest: There are multifocal ill-defined and nodular opacities
within the imaged lung apices which are likely infectious in
etiology. Septic emboli cannot be excluded.

Review of the MIP images confirms the above findings

CTA HEAD FINDINGS

Anterior circulation:

The intracranial right internal carotid artery is patent without
significant stenosis.

The intracranial left internal carotid artery is patent. Mild mixed
plaque within the paraclinoid segment with mild stenosis at this
site.

The M1 middle cerebral arteries are patent without significant
stenosis. No M2 proximal branch occlusion is identified.
Atherosclerotic irregularity of the M2 and more distal MCA branch
vessels bilaterally. Most notably, there are foci of moderate/severe
stenosis within a proximal M2 right MCA branch vessel (series 6,
image 17).

Hypoplastic A1 left ACA. The anterior cerebral arteries are patent
without high-grade proximal stenosis. There is a bulbous appearance
of the A-comm complex measuring 3.5 cm (series 4, image 246) (series
5, image 80).

Posterior circulation:

The intracranial vertebral arteries are patent bilaterally.
Moderate/severe focal atherosclerotic narrowing of the V4 segment on
the right. Immediately adjacent to this stenosis, there is a 1-2 mm
rounded focus of hyperdensity along the V4 segment (series 5, images
130 and 131). This may reflect a tiny aneurysm or eccentric focus of
calcified plaque.

The basilar artery is patent without significant stenosis.

Suspected fetal origin right posterior cerebral artery. The right
posterior communicating artery becomes occluded at its distal aspect
(series 7, images 97 and 98). No definite distal reconstitution of
the right posterior cerebral artery is identified.

There is a fetal origin left posterior cerebral artery, which is
patent. There is a high-grade focal stenosis within the left
posterior communicating artery (series 7, image 121).

Venous sinuses: Within limitations of contrast timing, no convincing
thrombus.

Anatomic variants: As described

Review of the MIP images confirms the above findings
IMPRESSION: CT head:

1. Acute/early subacute infarction changes within the right
occipital and temporal lobes, as well as right thalamus, have not
significantly changed in extent as compared to examinations
performed earlier the same day.
2. No interval intracranial abnormality is identified.
3. Stable background moderate chronic small vessel ischemic disease
and mild generalized parenchymal atrophy.

CTA neck:

1. The bilateral common and internal carotid arteries are patent
within the neck without significant stenosis.
2. The vertebral arteries are codominant and patent within the neck
bilaterally. Moderate/severe atherosclerotic narrowing at the origin
of the left vertebral artery.
3. Multifocal ill-defined and nodular opacities within the periphery
of the imaged lung apices which are likely infectious in etiology.
Septic emboli cannot be excluded.
4. Ill-defined soft tissue with irregular enhancement and regions of
internal hypodensity within the left neck measuring 3.9 x 1.9 x
cm. Findings are incompletely assessed on this arterial phase scan,
but suspicious for a residual/recurrent tumor deposit or nodal
conglomerate. Non-emergent contrast-enhanced neck CT is recommended
for further evaluation.

CTA head:

1. Probable fetal origin right posterior cerebral artery. The right
posterior communicating artery becomes occluded at its distal
aspect. No definite reconstitution of the right posterior cerebral
artery is identified more distally.
2. Fetal origin left posterior cerebral artery with high-grade focal
stenosis within the left posterior communicating artery.
3. Focal high-grade stenosis within the V4 right vertebral artery.
4. Atherosclerotic irregularity of the M2 and more distal MCA branch
vessels bilaterally. Most notably there are multifocal
moderate/severe stenoses within a proximal M2 right MCA branch.
5. Bulbous anterior communicating artery complex measuring up to
mm suspicious for aneurysm. Catheter-based angiography is
recommended for further evaluation, as clinically warranted.
6. An additional 1-2 mm saccular aneurysm of the V4 right vertebral
artery is questioned. This too could be further assessed at time of
catheter based angiography.

ADDENDUM:
These results were called by telephone at the time of interpretation
on [DATE] at [DATE] to provider TRUJILLO ROHENA , who verbally
acknowledged these results.

*** End of Addendum ***
FINDINGS: CT HEAD FINDINGS

Brain:

Acute/early subacute infarction changes within the right occipital
lobe have not significantly changed in extent. Additional foci of
acute/early subacute infarction within the medial and posterior
right temporal lobe and right thalamus, some of which was better
appreciated on same-day brain MRI. Subtle petechial hemorrhage
within the right occipital lobe was also better appreciated on this
prior exam.

No interval infarct is identified. Redemonstrated chronic cortically
based infarct within the posterior left frontal lobe. Stable
background moderate chronic small vessel ischemic disease and mild
generalized parenchymal atrophy. There is no evidence of
intracranial mass. No midline shift or extra-axial fluid collection

Vascular: Reported below.

Skull: Normal. Negative for fracture or focal lesion.

Sinuses: Mild ethmoid and maxillary sinus mucosal thickening. No
significant mastoid effusion.

Orbits: No acute abnormality.

Review of the MIP images confirms the above findings

CTA NECK FINDINGS

Aortic arch: Common origin of the innominate and left common carotid
arteries. The visualized aortic arch is unremarkable. No significant
innominate or proximal subclavian artery stenosis.

Right carotid system: CCA and ICA patent within the neck without
significant stenosis. Mild mixed plaque within the carotid
bifurcation

Left carotid system: CCA and ICA patent within the neck without
significant stenosis. Mild soft plaque within the CCA. Prominent
tortuosity of the proximal to mid ICA.

Vertebral arteries: The vertebral arteries are patent within the
neck bilaterally. Soft plaque results in moderate/severe stenosis at
the origin of the left vertebral artery.

Skeleton: No acute bony abnormality or aggressive osseous lesion.

Other neck: Ill ill-defined soft tissue prominence with irregular
enhancement and regions of internal hypodensity within the left
neck, closely related to the left sternocleidomastoid muscle. This
region is incompletely assessed on this arterial phase scan, but
measures approximately 3.9 x 1.9 x 5.4 cm (AP x TV x CC) (for
instance as seen on series 4, image 160 and series 5, image 161).
Findings are highly suspicious for a residual/recurrent tumor
deposit or nodal conglomerate. Surrounding soft tissue infiltration
is nonspecific but may reflect treatment related change.

Upper chest: There are multifocal ill-defined and nodular opacities
within the imaged lung apices which are likely infectious in
etiology. Septic emboli cannot be excluded.

Review of the MIP images confirms the above findings

CTA HEAD FINDINGS

Anterior circulation:

The intracranial right internal carotid artery is patent without
significant stenosis.

The intracranial left internal carotid artery is patent. Mild mixed
plaque within the paraclinoid segment with mild stenosis at this
site.

The M1 middle cerebral arteries are patent without significant
stenosis. No M2 proximal branch occlusion is identified.
Atherosclerotic irregularity of the M2 and more distal MCA branch
vessels bilaterally. Most notably, there are foci of moderate/severe
stenosis within a proximal M2 right MCA branch vessel (series 6,
image 17).

Hypoplastic A1 left ACA. The anterior cerebral arteries are patent
without high-grade proximal stenosis. There is a bulbous appearance
of the A-comm complex measuring 3.5 cm (series 4, image 246) (series
5, image 80).

Posterior circulation:

The intracranial vertebral arteries are patent bilaterally.
Moderate/severe focal atherosclerotic narrowing of the V4 segment on
the right. Immediately adjacent to this stenosis, there is a 1-2 mm
rounded focus of hyperdensity along the V4 segment (series 5, images
130 and 131). This may reflect a tiny aneurysm or eccentric focus of
calcified plaque.

The basilar artery is patent without significant stenosis.

Suspected fetal origin right posterior cerebral artery. The right
posterior communicating artery becomes occluded at its distal aspect
(series 7, images 97 and 98). No definite distal reconstitution of
the right posterior cerebral artery is identified.

There is a fetal origin left posterior cerebral artery, which is
patent. There is a high-grade focal stenosis within the left
posterior communicating artery (series 7, image 121).

Venous sinuses: Within limitations of contrast timing, no convincing
thrombus.

Anatomic variants: As described

Review of the MIP images confirms the above findings
IMPRESSION: CT head:

1. Acute/early subacute infarction changes within the right
occipital and temporal lobes, as well as right thalamus, have not
significantly changed in extent as compared to examinations
performed earlier the same day.
2. No interval intracranial abnormality is identified.
3. Stable background moderate chronic small vessel ischemic disease
and mild generalized parenchymal atrophy.

CTA neck:

1. The bilateral common and internal carotid arteries are patent
within the neck without significant stenosis.
2. The vertebral arteries are codominant and patent within the neck
bilaterally. Moderate/severe atherosclerotic narrowing at the origin
of the left vertebral artery.
3. Multifocal ill-defined and nodular opacities within the periphery
of the imaged lung apices which are likely infectious in etiology.
Septic emboli cannot be excluded.
4. Ill-defined soft tissue with irregular enhancement and regions of
internal hypodensity within the left neck measuring 3.9 x 1.9 x
cm. Findings are incompletely assessed on this arterial phase scan,
but suspicious for a residual/recurrent tumor deposit or nodal
conglomerate. Non-emergent contrast-enhanced neck CT is recommended
for further evaluation.

CTA head:

1. Probable fetal origin right posterior cerebral artery. The right
posterior communicating artery becomes occluded at its distal
aspect. No definite reconstitution of the right posterior cerebral
artery is identified more distally.
2. Fetal origin left posterior cerebral artery with high-grade focal
stenosis within the left posterior communicating artery.
3. Focal high-grade stenosis within the V4 right vertebral artery.
4. Atherosclerotic irregularity of the M2 and more distal MCA branch
vessels bilaterally. Most notably there are multifocal
moderate/severe stenoses within a proximal M2 right MCA branch.
5. Bulbous anterior communicating artery complex measuring up to
mm suspicious for aneurysm. Catheter-based angiography is
recommended for further evaluation, as clinically warranted.
6. An additional 1-2 mm saccular aneurysm of the V4 right vertebral
artery is questioned. This too could be further assessed at time of
catheter based angiography.

## 2019-06-22 IMAGING — CT CT ANGIO HEAD
2 of 3 series · 8 of 33 positions shown · IV contrast (omnipaque)
Comparison: Brain MRI performed earlier the same day [DATE],
chest radiograph [DATE], PET-CT [DATE], neck CT [DATE]
COMPARISON: Brain MRI performed earlier the same day [DATE],
chest radiograph [DATE], PET-CT [DATE], neck CT [DATE]

Addendum:
CLINICAL DATA: Stroke, follow-up.

EXAM:
CT ANGIOGRAPHY HEAD AND NECK
TECHNIQUE: Multidetector CT imaging of the head and neck was performed using
the standard protocol during bolus administration of intravenous
contrast. Multiplanar CT image reconstructions and MIPs were
obtained to evaluate the vascular anatomy. Carotid stenosis
measurements (when applicable) are obtained utilizing NASCET
criteria, using the distal internal carotid diameter as the
denominator.
CONTRAST:  75mL OMNIPAQUE IOHEXOL 350 MG/ML SOLN

[Series 4: head wo · axial · 0.44mm/px · z∈[-37,+78]mm · 7 of 29 slices shown]
[im 3/29  soft-tissue]
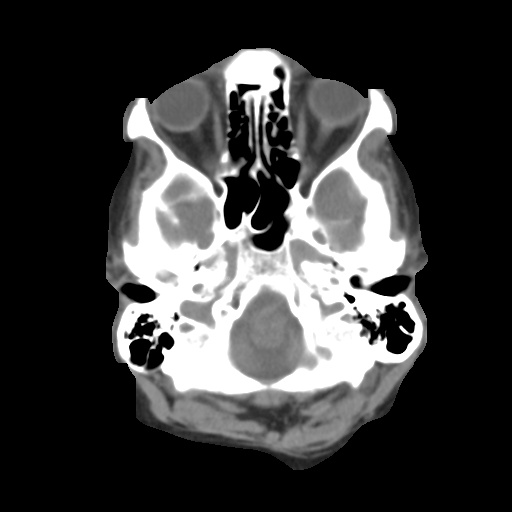
[im 7/29  bone]
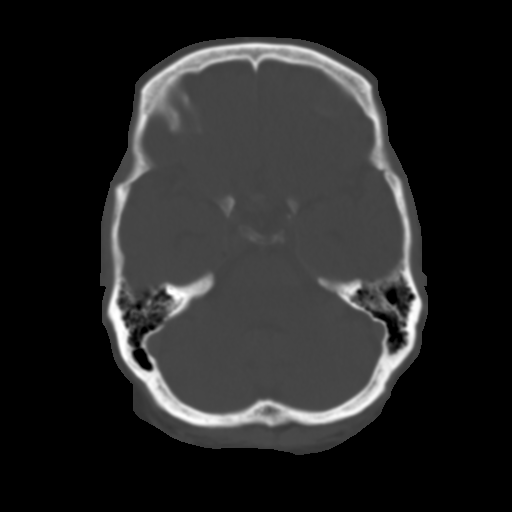
[im 11/29  soft-tissue]
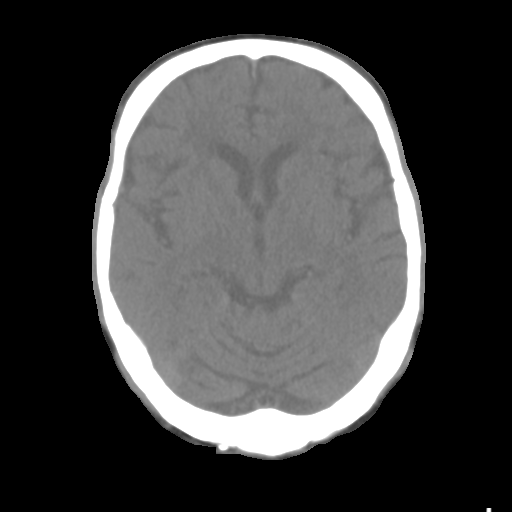
[im 16/29  bone]
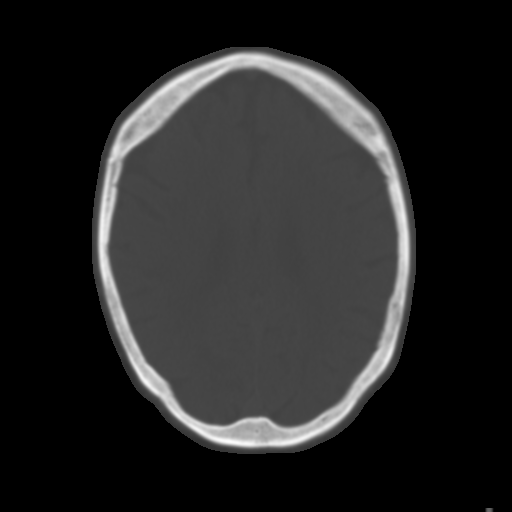
[im 18/29  soft-tissue]
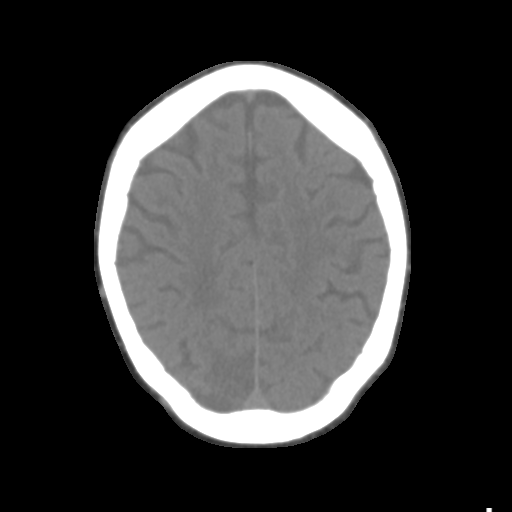
[im 22/29  bone]
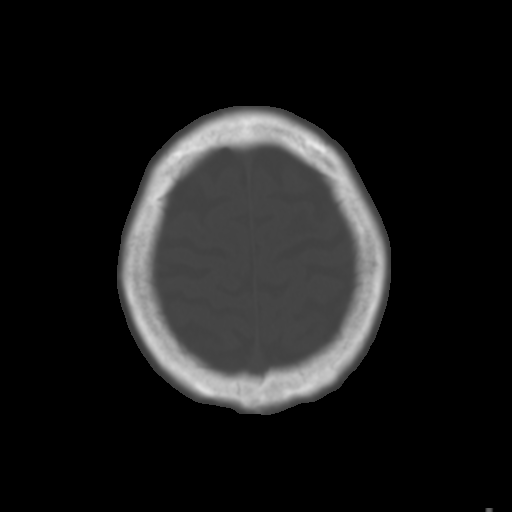
[im 26/29  soft-tissue]
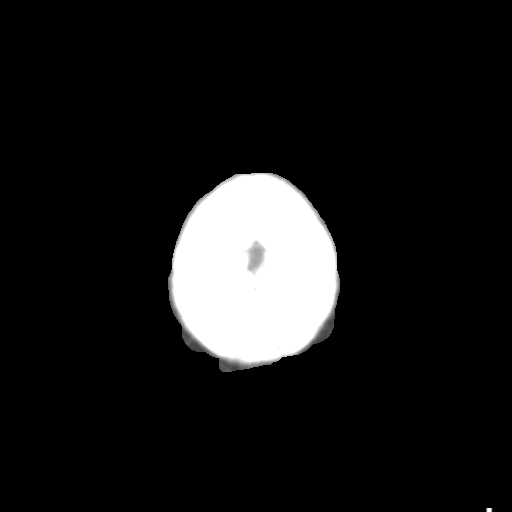

[Series 7: sagittal soft tissue · sagittal · 0.29mm/px · 1 of 55 slices shown]
[im 28/55  soft-tissue]
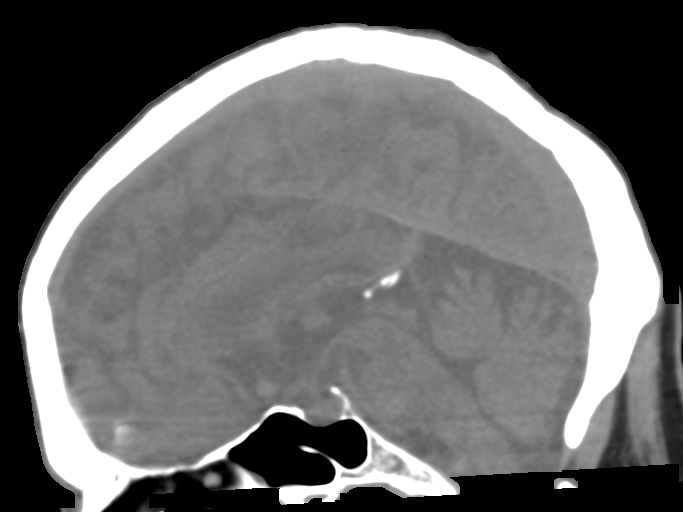

[8 of 33 positions shown; findings below may reference images not displayed]

FINDINGS: CT HEAD FINDINGS

Brain:

Acute/early subacute infarction changes within the right occipital
lobe have not significantly changed in extent. Additional foci of
acute/early subacute infarction within the medial and posterior
right temporal lobe and right thalamus, some of which was better
appreciated on same-day brain MRI. Subtle petechial hemorrhage
within the right occipital lobe was also better appreciated on this
prior exam.

No interval infarct is identified. Redemonstrated chronic cortically
based infarct within the posterior left frontal lobe. Stable
background moderate chronic small vessel ischemic disease and mild
generalized parenchymal atrophy. There is no evidence of
intracranial mass. No midline shift or extra-axial fluid collection

Vascular: Reported below.

Skull: Normal. Negative for fracture or focal lesion.

Sinuses: Mild ethmoid and maxillary sinus mucosal thickening. No
significant mastoid effusion.

Orbits: No acute abnormality.

Review of the MIP images confirms the above findings

CTA NECK FINDINGS

Aortic arch: Common origin of the innominate and left common carotid
arteries. The visualized aortic arch is unremarkable. No significant
innominate or proximal subclavian artery stenosis.

Right carotid system: CCA and ICA patent within the neck without
significant stenosis. Mild mixed plaque within the carotid
bifurcation

Left carotid system: CCA and ICA patent within the neck without
significant stenosis. Mild soft plaque within the CCA. Prominent
tortuosity of the proximal to mid ICA.

Vertebral arteries: The vertebral arteries are patent within the
neck bilaterally. Soft plaque results in moderate/severe stenosis at
the origin of the left vertebral artery.

Skeleton: No acute bony abnormality or aggressive osseous lesion.

Other neck: Ill ill-defined soft tissue prominence with irregular
enhancement and regions of internal hypodensity within the left
neck, closely related to the left sternocleidomastoid muscle. This
region is incompletely assessed on this arterial phase scan, but
measures approximately 3.9 x 1.9 x 5.4 cm (AP x TV x CC) (for
instance as seen on series 4, image 160 and series 5, image 161).
Findings are highly suspicious for a residual/recurrent tumor
deposit or nodal conglomerate. Surrounding soft tissue infiltration
is nonspecific but may reflect treatment related change.

Upper chest: There are multifocal ill-defined and nodular opacities
within the imaged lung apices which are likely infectious in
etiology. Septic emboli cannot be excluded.

Review of the MIP images confirms the above findings

CTA HEAD FINDINGS

Anterior circulation:

The intracranial right internal carotid artery is patent without
significant stenosis.

The intracranial left internal carotid artery is patent. Mild mixed
plaque within the paraclinoid segment with mild stenosis at this
site.

The M1 middle cerebral arteries are patent without significant
stenosis. No M2 proximal branch occlusion is identified.
Atherosclerotic irregularity of the M2 and more distal MCA branch
vessels bilaterally. Most notably, there are foci of moderate/severe
stenosis within a proximal M2 right MCA branch vessel (series 6,
image 17).

Hypoplastic A1 left ACA. The anterior cerebral arteries are patent
without high-grade proximal stenosis. There is a bulbous appearance
of the A-comm complex measuring 3.5 cm (series 4, image 246) (series
5, image 80).

Posterior circulation:

The intracranial vertebral arteries are patent bilaterally.
Moderate/severe focal atherosclerotic narrowing of the V4 segment on
the right. Immediately adjacent to this stenosis, there is a 1-2 mm
rounded focus of hyperdensity along the V4 segment (series 5, images
130 and 131). This may reflect a tiny aneurysm or eccentric focus of
calcified plaque.

The basilar artery is patent without significant stenosis.

Suspected fetal origin right posterior cerebral artery. The right
posterior communicating artery becomes occluded at its distal aspect
(series 7, images 97 and 98). No definite distal reconstitution of
the right posterior cerebral artery is identified.

There is a fetal origin left posterior cerebral artery, which is
patent. There is a high-grade focal stenosis within the left
posterior communicating artery (series 7, image 121).

Venous sinuses: Within limitations of contrast timing, no convincing
thrombus.

Anatomic variants: As described

Review of the MIP images confirms the above findings
IMPRESSION: CT head:

1. Acute/early subacute infarction changes within the right
occipital and temporal lobes, as well as right thalamus, have not
significantly changed in extent as compared to examinations
performed earlier the same day.
2. No interval intracranial abnormality is identified.
3. Stable background moderate chronic small vessel ischemic disease
and mild generalized parenchymal atrophy.

CTA neck:

1. The bilateral common and internal carotid arteries are patent
within the neck without significant stenosis.
2. The vertebral arteries are codominant and patent within the neck
bilaterally. Moderate/severe atherosclerotic narrowing at the origin
of the left vertebral artery.
3. Multifocal ill-defined and nodular opacities within the periphery
of the imaged lung apices which are likely infectious in etiology.
Septic emboli cannot be excluded.
4. Ill-defined soft tissue with irregular enhancement and regions of
internal hypodensity within the left neck measuring 3.9 x 1.9 x
cm. Findings are incompletely assessed on this arterial phase scan,
but suspicious for a residual/recurrent tumor deposit or nodal
conglomerate. Non-emergent contrast-enhanced neck CT is recommended
for further evaluation.

CTA head:

1. Probable fetal origin right posterior cerebral artery. The right
posterior communicating artery becomes occluded at its distal
aspect. No definite reconstitution of the right posterior cerebral
artery is identified more distally.
2. Fetal origin left posterior cerebral artery with high-grade focal
stenosis within the left posterior communicating artery.
3. Focal high-grade stenosis within the V4 right vertebral artery.
4. Atherosclerotic irregularity of the M2 and more distal MCA branch
vessels bilaterally. Most notably there are multifocal
moderate/severe stenoses within a proximal M2 right MCA branch.
5. Bulbous anterior communicating artery complex measuring up to
mm suspicious for aneurysm. Catheter-based angiography is
recommended for further evaluation, as clinically warranted.
6. An additional 1-2 mm saccular aneurysm of the V4 right vertebral
artery is questioned. This too could be further assessed at time of
catheter based angiography.

ADDENDUM:
These results were called by telephone at the time of interpretation
on [DATE] at [DATE] to provider TRUJILLO ROHENA , who verbally
acknowledged these results.

*** End of Addendum ***
FINDINGS: CT HEAD FINDINGS

Brain:

Acute/early subacute infarction changes within the right occipital
lobe have not significantly changed in extent. Additional foci of
acute/early subacute infarction within the medial and posterior
right temporal lobe and right thalamus, some of which was better
appreciated on same-day brain MRI. Subtle petechial hemorrhage
within the right occipital lobe was also better appreciated on this
prior exam.

No interval infarct is identified. Redemonstrated chronic cortically
based infarct within the posterior left frontal lobe. Stable
background moderate chronic small vessel ischemic disease and mild
generalized parenchymal atrophy. There is no evidence of
intracranial mass. No midline shift or extra-axial fluid collection

Vascular: Reported below.

Skull: Normal. Negative for fracture or focal lesion.

Sinuses: Mild ethmoid and maxillary sinus mucosal thickening. No
significant mastoid effusion.

Orbits: No acute abnormality.

Review of the MIP images confirms the above findings

CTA NECK FINDINGS

Aortic arch: Common origin of the innominate and left common carotid
arteries. The visualized aortic arch is unremarkable. No significant
innominate or proximal subclavian artery stenosis.

Right carotid system: CCA and ICA patent within the neck without
significant stenosis. Mild mixed plaque within the carotid
bifurcation

Left carotid system: CCA and ICA patent within the neck without
significant stenosis. Mild soft plaque within the CCA. Prominent
tortuosity of the proximal to mid ICA.

Vertebral arteries: The vertebral arteries are patent within the
neck bilaterally. Soft plaque results in moderate/severe stenosis at
the origin of the left vertebral artery.

Skeleton: No acute bony abnormality or aggressive osseous lesion.

Other neck: Ill ill-defined soft tissue prominence with irregular
enhancement and regions of internal hypodensity within the left
neck, closely related to the left sternocleidomastoid muscle. This
region is incompletely assessed on this arterial phase scan, but
measures approximately 3.9 x 1.9 x 5.4 cm (AP x TV x CC) (for
instance as seen on series 4, image 160 and series 5, image 161).
Findings are highly suspicious for a residual/recurrent tumor
deposit or nodal conglomerate. Surrounding soft tissue infiltration
is nonspecific but may reflect treatment related change.

Upper chest: There are multifocal ill-defined and nodular opacities
within the imaged lung apices which are likely infectious in
etiology. Septic emboli cannot be excluded.

Review of the MIP images confirms the above findings

CTA HEAD FINDINGS

Anterior circulation:

The intracranial right internal carotid artery is patent without
significant stenosis.

The intracranial left internal carotid artery is patent. Mild mixed
plaque within the paraclinoid segment with mild stenosis at this
site.

The M1 middle cerebral arteries are patent without significant
stenosis. No M2 proximal branch occlusion is identified.
Atherosclerotic irregularity of the M2 and more distal MCA branch
vessels bilaterally. Most notably, there are foci of moderate/severe
stenosis within a proximal M2 right MCA branch vessel (series 6,
image 17).

Hypoplastic A1 left ACA. The anterior cerebral arteries are patent
without high-grade proximal stenosis. There is a bulbous appearance
of the A-comm complex measuring 3.5 cm (series 4, image 246) (series
5, image 80).

Posterior circulation:

The intracranial vertebral arteries are patent bilaterally.
Moderate/severe focal atherosclerotic narrowing of the V4 segment on
the right. Immediately adjacent to this stenosis, there is a 1-2 mm
rounded focus of hyperdensity along the V4 segment (series 5, images
130 and 131). This may reflect a tiny aneurysm or eccentric focus of
calcified plaque.

The basilar artery is patent without significant stenosis.

Suspected fetal origin right posterior cerebral artery. The right
posterior communicating artery becomes occluded at its distal aspect
(series 7, images 97 and 98). No definite distal reconstitution of
the right posterior cerebral artery is identified.

There is a fetal origin left posterior cerebral artery, which is
patent. There is a high-grade focal stenosis within the left
posterior communicating artery (series 7, image 121).

Venous sinuses: Within limitations of contrast timing, no convincing
thrombus.

Anatomic variants: As described

Review of the MIP images confirms the above findings
IMPRESSION: CT head:

1. Acute/early subacute infarction changes within the right
occipital and temporal lobes, as well as right thalamus, have not
significantly changed in extent as compared to examinations
performed earlier the same day.
2. No interval intracranial abnormality is identified.
3. Stable background moderate chronic small vessel ischemic disease
and mild generalized parenchymal atrophy.

CTA neck:

1. The bilateral common and internal carotid arteries are patent
within the neck without significant stenosis.
2. The vertebral arteries are codominant and patent within the neck
bilaterally. Moderate/severe atherosclerotic narrowing at the origin
of the left vertebral artery.
3. Multifocal ill-defined and nodular opacities within the periphery
of the imaged lung apices which are likely infectious in etiology.
Septic emboli cannot be excluded.
4. Ill-defined soft tissue with irregular enhancement and regions of
internal hypodensity within the left neck measuring 3.9 x 1.9 x
cm. Findings are incompletely assessed on this arterial phase scan,
but suspicious for a residual/recurrent tumor deposit or nodal
conglomerate. Non-emergent contrast-enhanced neck CT is recommended
for further evaluation.

CTA head:

1. Probable fetal origin right posterior cerebral artery. The right
posterior communicating artery becomes occluded at its distal
aspect. No definite reconstitution of the right posterior cerebral
artery is identified more distally.
2. Fetal origin left posterior cerebral artery with high-grade focal
stenosis within the left posterior communicating artery.
3. Focal high-grade stenosis within the V4 right vertebral artery.
4. Atherosclerotic irregularity of the M2 and more distal MCA branch
vessels bilaterally. Most notably there are multifocal
moderate/severe stenoses within a proximal M2 right MCA branch.
5. Bulbous anterior communicating artery complex measuring up to
mm suspicious for aneurysm. Catheter-based angiography is
recommended for further evaluation, as clinically warranted.
6. An additional 1-2 mm saccular aneurysm of the V4 right vertebral
artery is questioned. This too could be further assessed at time of
catheter based angiography.

## 2019-06-22 IMAGING — DX DG CHEST 1V PORT
2 series · 2 of 2 positions shown · non-contrast
Comparison: One-view chest x-ray [DATE]

CLINICAL DATA: Hypoxia. COVID positive.

EXAM:
PORTABLE CHEST 1 VIEW

[chest ap (1 of 2)]
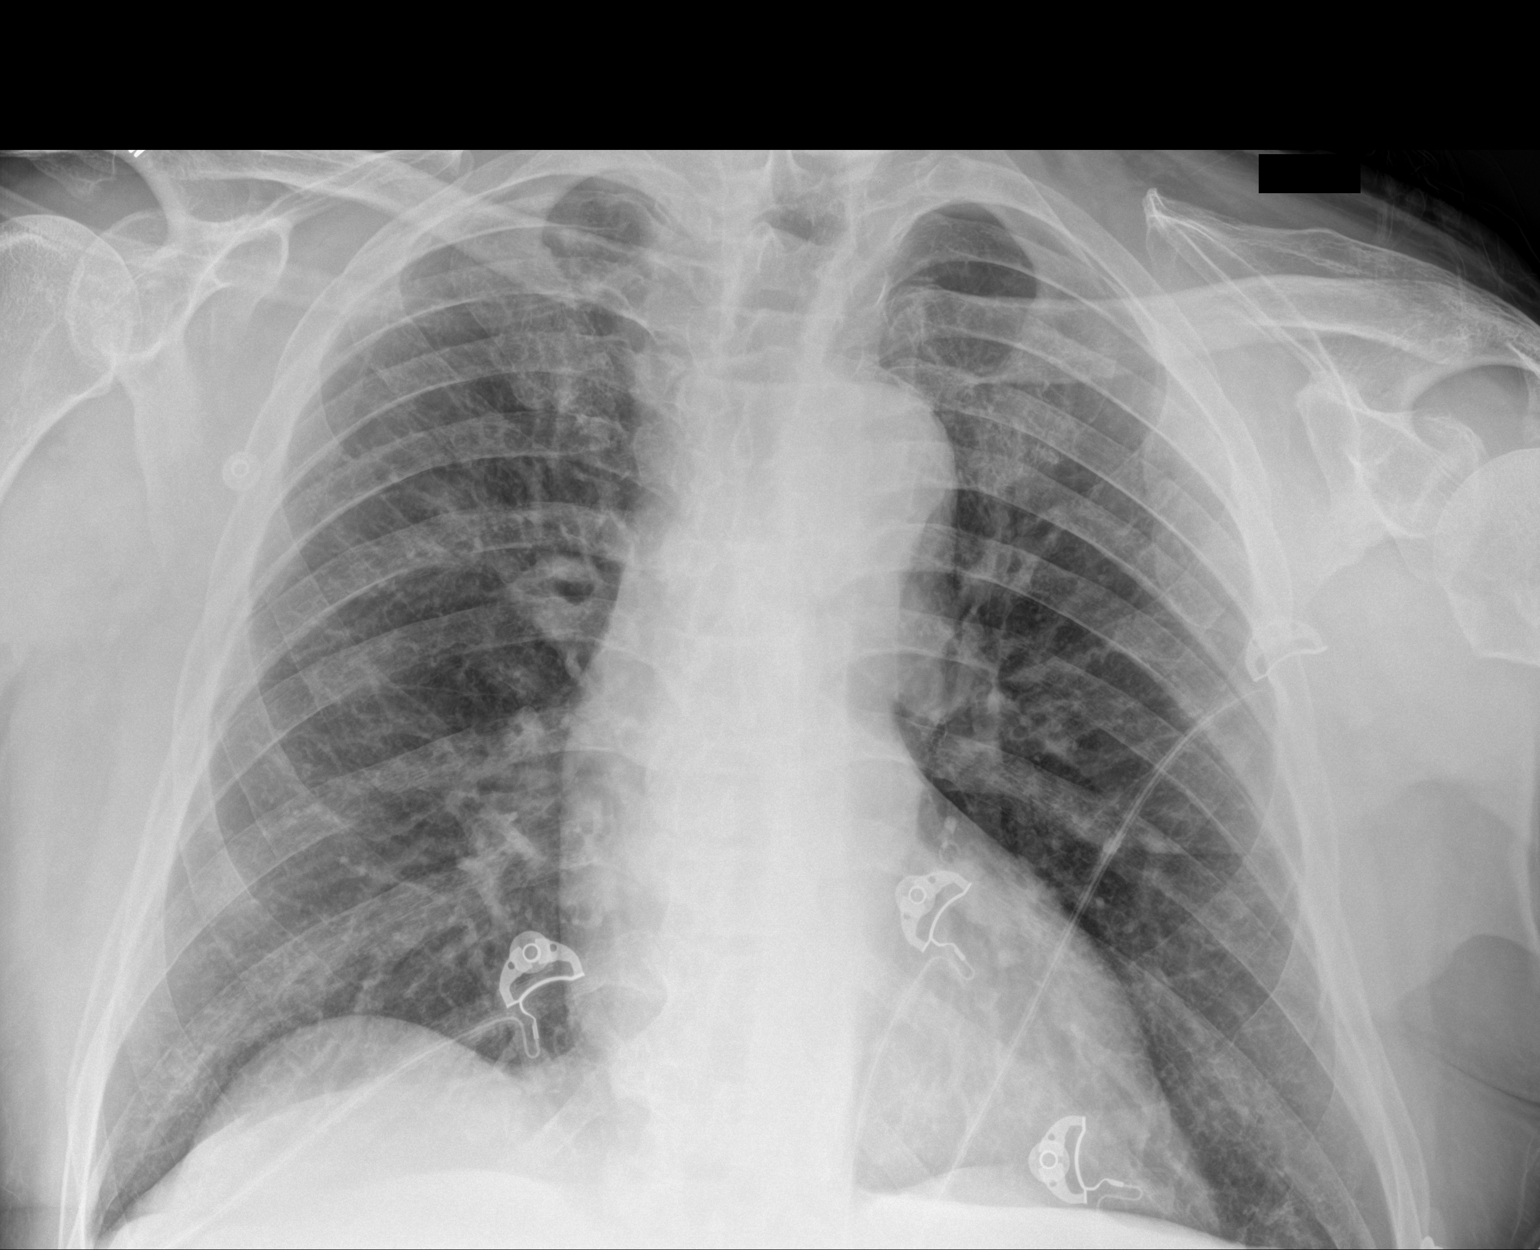

[chest ap (2 of 2)]
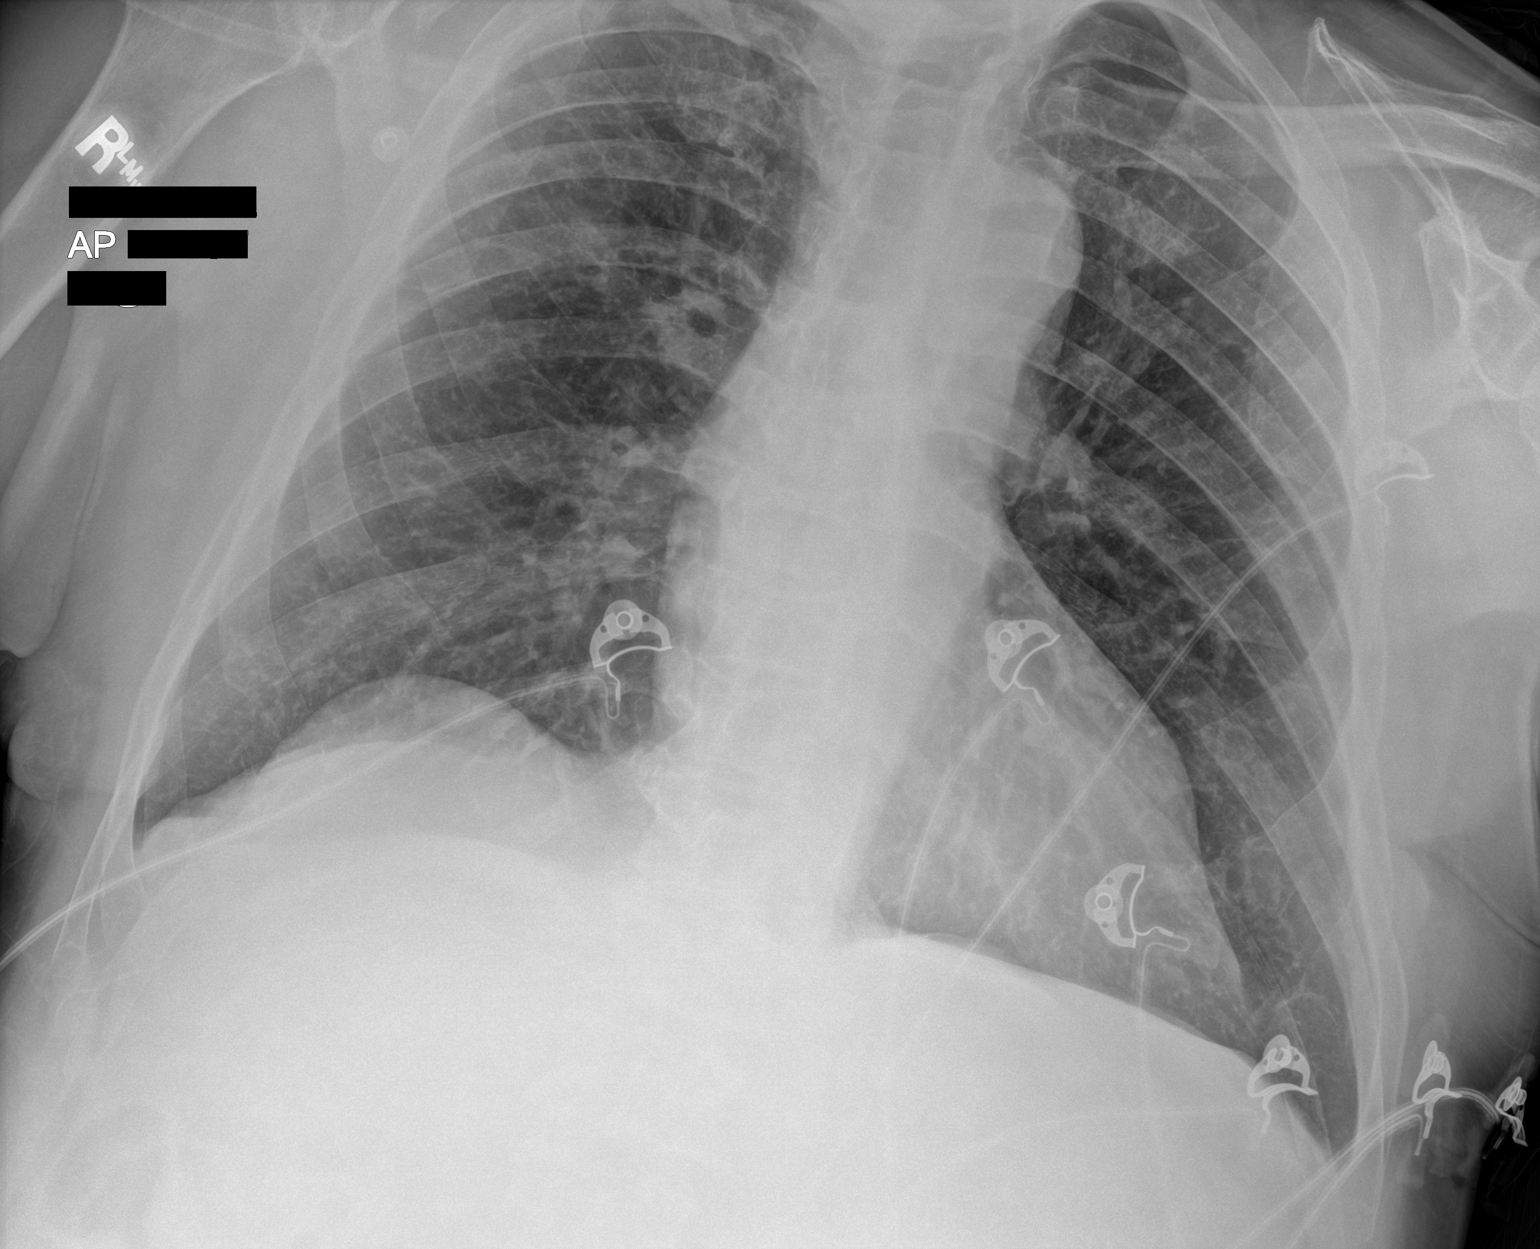

[2 of 2 positions shown; findings below may reference images not displayed]

FINDINGS: Heart size is normal. Patchy peripheral airspace disease is present
in the right upper lobe. No significant consolidation is present. No
effusions are present. Bony thorax is within normal limits.
IMPRESSION: Patchy peripheral airspace disease in the right upper lobe
compatible with pneumonia.

## 2019-06-22 IMAGING — CT CT ANGIO NECK
1 of 8 series · 6 of 33 positions shown · IV contrast (APPLIED)
Comparison: Brain MRI performed earlier the same day [DATE],
chest radiograph [DATE], PET-CT [DATE], neck CT [DATE]
COMPARISON: Brain MRI performed earlier the same day [DATE],
chest radiograph [DATE], PET-CT [DATE], neck CT [DATE]

Addendum:
CLINICAL DATA: Stroke, follow-up.

EXAM:
CT ANGIOGRAPHY HEAD AND NECK
TECHNIQUE: Multidetector CT imaging of the head and neck was performed using
the standard protocol during bolus administration of intravenous
contrast. Multiplanar CT image reconstructions and MIPs were
obtained to evaluate the vascular anatomy. Carotid stenosis
measurements (when applicable) are obtained utilizing NASCET
criteria, using the distal internal carotid diameter as the
denominator.
CONTRAST:  75mL OMNIPAQUE IOHEXOL 350 MG/ML SOLN

[Series 3: cta head neck thins · axial · 0.43mm/px · z∈[-211,+34]mm · 6 of 688 slices shown]
[im 99/688  soft-tissue]
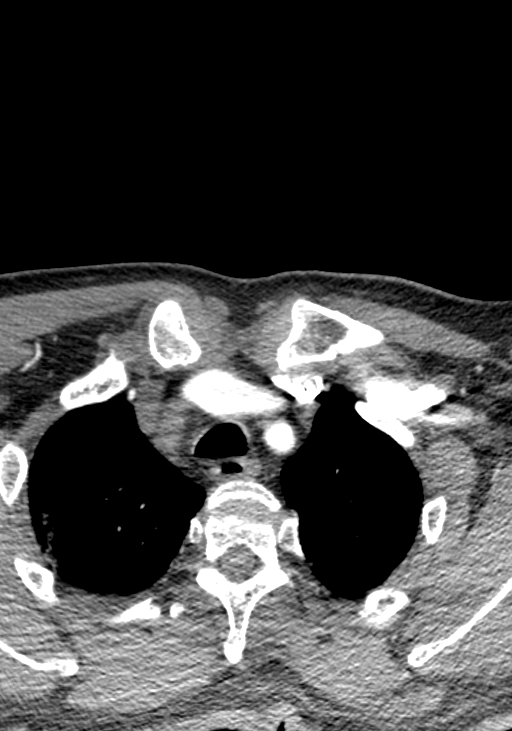
[im 197/688  bone]
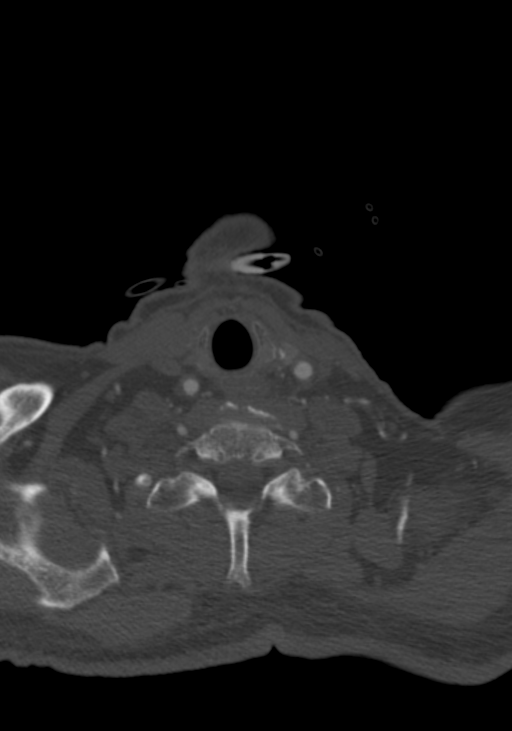
[im 295/688  soft-tissue]
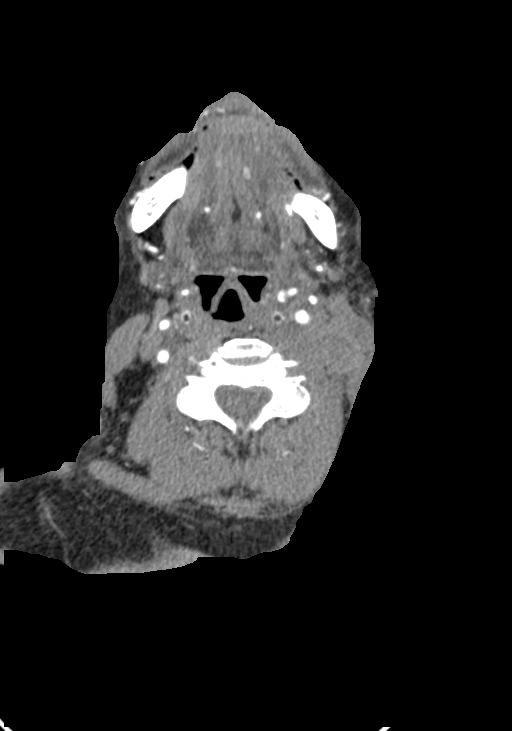
[im 393/688  bone]
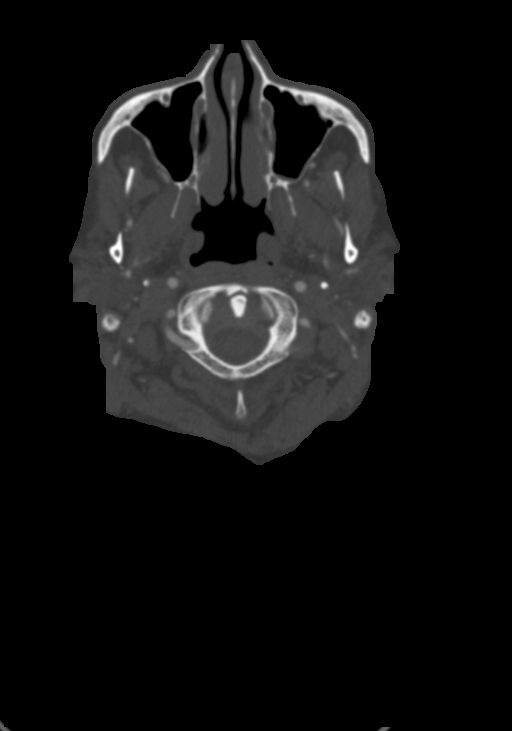
[im 491/688  soft-tissue]
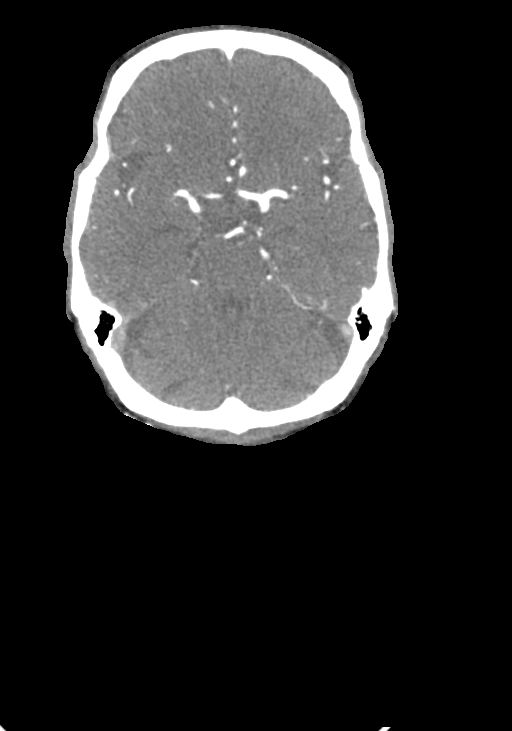
[im 589/688  bone]
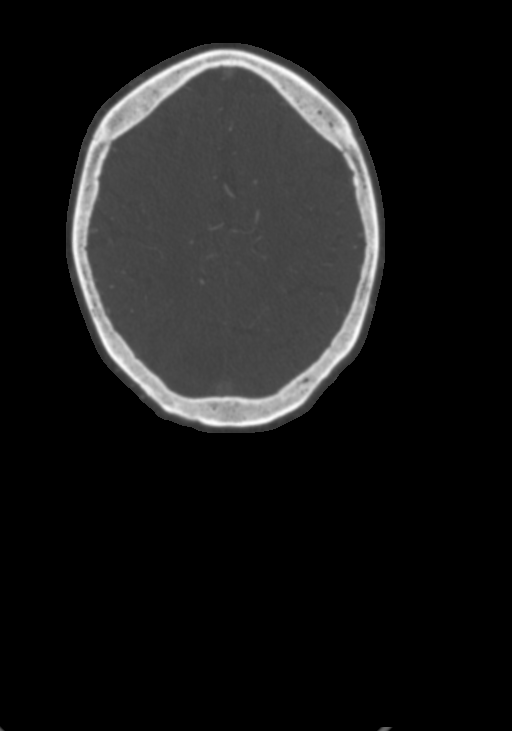

[6 of 33 positions shown; findings below may reference images not displayed]

FINDINGS: CT HEAD FINDINGS

Brain:

Acute/early subacute infarction changes within the right occipital
lobe have not significantly changed in extent. Additional foci of
acute/early subacute infarction within the medial and posterior
right temporal lobe and right thalamus, some of which was better
appreciated on same-day brain MRI. Subtle petechial hemorrhage
within the right occipital lobe was also better appreciated on this
prior exam.

No interval infarct is identified. Redemonstrated chronic cortically
based infarct within the posterior left frontal lobe. Stable
background moderate chronic small vessel ischemic disease and mild
generalized parenchymal atrophy. There is no evidence of
intracranial mass. No midline shift or extra-axial fluid collection

Vascular: Reported below.

Skull: Normal. Negative for fracture or focal lesion.

Sinuses: Mild ethmoid and maxillary sinus mucosal thickening. No
significant mastoid effusion.

Orbits: No acute abnormality.

Review of the MIP images confirms the above findings

CTA NECK FINDINGS

Aortic arch: Common origin of the innominate and left common carotid
arteries. The visualized aortic arch is unremarkable. No significant
innominate or proximal subclavian artery stenosis.

Right carotid system: CCA and ICA patent within the neck without
significant stenosis. Mild mixed plaque within the carotid
bifurcation

Left carotid system: CCA and ICA patent within the neck without
significant stenosis. Mild soft plaque within the CCA. Prominent
tortuosity of the proximal to mid ICA.

Vertebral arteries: The vertebral arteries are patent within the
neck bilaterally. Soft plaque results in moderate/severe stenosis at
the origin of the left vertebral artery.

Skeleton: No acute bony abnormality or aggressive osseous lesion.

Other neck: Ill ill-defined soft tissue prominence with irregular
enhancement and regions of internal hypodensity within the left
neck, closely related to the left sternocleidomastoid muscle. This
region is incompletely assessed on this arterial phase scan, but
measures approximately 3.9 x 1.9 x 5.4 cm (AP x TV x CC) (for
instance as seen on series 4, image 160 and series 5, image 161).
Findings are highly suspicious for a residual/recurrent tumor
deposit or nodal conglomerate. Surrounding soft tissue infiltration
is nonspecific but may reflect treatment related change.

Upper chest: There are multifocal ill-defined and nodular opacities
within the imaged lung apices which are likely infectious in
etiology. Septic emboli cannot be excluded.

Review of the MIP images confirms the above findings

CTA HEAD FINDINGS

Anterior circulation:

The intracranial right internal carotid artery is patent without
significant stenosis.

The intracranial left internal carotid artery is patent. Mild mixed
plaque within the paraclinoid segment with mild stenosis at this
site.

The M1 middle cerebral arteries are patent without significant
stenosis. No M2 proximal branch occlusion is identified.
Atherosclerotic irregularity of the M2 and more distal MCA branch
vessels bilaterally. Most notably, there are foci of moderate/severe
stenosis within a proximal M2 right MCA branch vessel (series 6,
image 17).

Hypoplastic A1 left ACA. The anterior cerebral arteries are patent
without high-grade proximal stenosis. There is a bulbous appearance
of the A-comm complex measuring 3.5 cm (series 4, image 246) (series
5, image 80).

Posterior circulation:

The intracranial vertebral arteries are patent bilaterally.
Moderate/severe focal atherosclerotic narrowing of the V4 segment on
the right. Immediately adjacent to this stenosis, there is a 1-2 mm
rounded focus of hyperdensity along the V4 segment (series 5, images
130 and 131). This may reflect a tiny aneurysm or eccentric focus of
calcified plaque.

The basilar artery is patent without significant stenosis.

Suspected fetal origin right posterior cerebral artery. The right
posterior communicating artery becomes occluded at its distal aspect
(series 7, images 97 and 98). No definite distal reconstitution of
the right posterior cerebral artery is identified.

There is a fetal origin left posterior cerebral artery, which is
patent. There is a high-grade focal stenosis within the left
posterior communicating artery (series 7, image 121).

Venous sinuses: Within limitations of contrast timing, no convincing
thrombus.

Anatomic variants: As described

Review of the MIP images confirms the above findings
IMPRESSION: CT head:

1. Acute/early subacute infarction changes within the right
occipital and temporal lobes, as well as right thalamus, have not
significantly changed in extent as compared to examinations
performed earlier the same day.
2. No interval intracranial abnormality is identified.
3. Stable background moderate chronic small vessel ischemic disease
and mild generalized parenchymal atrophy.

CTA neck:

1. The bilateral common and internal carotid arteries are patent
within the neck without significant stenosis.
2. The vertebral arteries are codominant and patent within the neck
bilaterally. Moderate/severe atherosclerotic narrowing at the origin
of the left vertebral artery.
3. Multifocal ill-defined and nodular opacities within the periphery
of the imaged lung apices which are likely infectious in etiology.
Septic emboli cannot be excluded.
4. Ill-defined soft tissue with irregular enhancement and regions of
internal hypodensity within the left neck measuring 3.9 x 1.9 x
cm. Findings are incompletely assessed on this arterial phase scan,
but suspicious for a residual/recurrent tumor deposit or nodal
conglomerate. Non-emergent contrast-enhanced neck CT is recommended
for further evaluation.

CTA head:

1. Probable fetal origin right posterior cerebral artery. The right
posterior communicating artery becomes occluded at its distal
aspect. No definite reconstitution of the right posterior cerebral
artery is identified more distally.
2. Fetal origin left posterior cerebral artery with high-grade focal
stenosis within the left posterior communicating artery.
3. Focal high-grade stenosis within the V4 right vertebral artery.
4. Atherosclerotic irregularity of the M2 and more distal MCA branch
vessels bilaterally. Most notably there are multifocal
moderate/severe stenoses within a proximal M2 right MCA branch.
5. Bulbous anterior communicating artery complex measuring up to
mm suspicious for aneurysm. Catheter-based angiography is
recommended for further evaluation, as clinically warranted.
6. An additional 1-2 mm saccular aneurysm of the V4 right vertebral
artery is questioned. This too could be further assessed at time of
catheter based angiography.

ADDENDUM:
These results were called by telephone at the time of interpretation
on [DATE] at [DATE] to provider TRUJILLO ROHENA , who verbally
acknowledged these results.

*** End of Addendum ***
FINDINGS: CT HEAD FINDINGS

Brain:

Acute/early subacute infarction changes within the right occipital
lobe have not significantly changed in extent. Additional foci of
acute/early subacute infarction within the medial and posterior
right temporal lobe and right thalamus, some of which was better
appreciated on same-day brain MRI. Subtle petechial hemorrhage
within the right occipital lobe was also better appreciated on this
prior exam.

No interval infarct is identified. Redemonstrated chronic cortically
based infarct within the posterior left frontal lobe. Stable
background moderate chronic small vessel ischemic disease and mild
generalized parenchymal atrophy. There is no evidence of
intracranial mass. No midline shift or extra-axial fluid collection

Vascular: Reported below.

Skull: Normal. Negative for fracture or focal lesion.

Sinuses: Mild ethmoid and maxillary sinus mucosal thickening. No
significant mastoid effusion.

Orbits: No acute abnormality.

Review of the MIP images confirms the above findings

CTA NECK FINDINGS

Aortic arch: Common origin of the innominate and left common carotid
arteries. The visualized aortic arch is unremarkable. No significant
innominate or proximal subclavian artery stenosis.

Right carotid system: CCA and ICA patent within the neck without
significant stenosis. Mild mixed plaque within the carotid
bifurcation

Left carotid system: CCA and ICA patent within the neck without
significant stenosis. Mild soft plaque within the CCA. Prominent
tortuosity of the proximal to mid ICA.

Vertebral arteries: The vertebral arteries are patent within the
neck bilaterally. Soft plaque results in moderate/severe stenosis at
the origin of the left vertebral artery.

Skeleton: No acute bony abnormality or aggressive osseous lesion.

Other neck: Ill ill-defined soft tissue prominence with irregular
enhancement and regions of internal hypodensity within the left
neck, closely related to the left sternocleidomastoid muscle. This
region is incompletely assessed on this arterial phase scan, but
measures approximately 3.9 x 1.9 x 5.4 cm (AP x TV x CC) (for
instance as seen on series 4, image 160 and series 5, image 161).
Findings are highly suspicious for a residual/recurrent tumor
deposit or nodal conglomerate. Surrounding soft tissue infiltration
is nonspecific but may reflect treatment related change.

Upper chest: There are multifocal ill-defined and nodular opacities
within the imaged lung apices which are likely infectious in
etiology. Septic emboli cannot be excluded.

Review of the MIP images confirms the above findings

CTA HEAD FINDINGS

Anterior circulation:

The intracranial right internal carotid artery is patent without
significant stenosis.

The intracranial left internal carotid artery is patent. Mild mixed
plaque within the paraclinoid segment with mild stenosis at this
site.

The M1 middle cerebral arteries are patent without significant
stenosis. No M2 proximal branch occlusion is identified.
Atherosclerotic irregularity of the M2 and more distal MCA branch
vessels bilaterally. Most notably, there are foci of moderate/severe
stenosis within a proximal M2 right MCA branch vessel (series 6,
image 17).

Hypoplastic A1 left ACA. The anterior cerebral arteries are patent
without high-grade proximal stenosis. There is a bulbous appearance
of the A-comm complex measuring 3.5 cm (series 4, image 246) (series
5, image 80).

Posterior circulation:

The intracranial vertebral arteries are patent bilaterally.
Moderate/severe focal atherosclerotic narrowing of the V4 segment on
the right. Immediately adjacent to this stenosis, there is a 1-2 mm
rounded focus of hyperdensity along the V4 segment (series 5, images
130 and 131). This may reflect a tiny aneurysm or eccentric focus of
calcified plaque.

The basilar artery is patent without significant stenosis.

Suspected fetal origin right posterior cerebral artery. The right
posterior communicating artery becomes occluded at its distal aspect
(series 7, images 97 and 98). No definite distal reconstitution of
the right posterior cerebral artery is identified.

There is a fetal origin left posterior cerebral artery, which is
patent. There is a high-grade focal stenosis within the left
posterior communicating artery (series 7, image 121).

Venous sinuses: Within limitations of contrast timing, no convincing
thrombus.

Anatomic variants: As described

Review of the MIP images confirms the above findings
IMPRESSION: CT head:

1. Acute/early subacute infarction changes within the right
occipital and temporal lobes, as well as right thalamus, have not
significantly changed in extent as compared to examinations
performed earlier the same day.
2. No interval intracranial abnormality is identified.
3. Stable background moderate chronic small vessel ischemic disease
and mild generalized parenchymal atrophy.

CTA neck:

1. The bilateral common and internal carotid arteries are patent
within the neck without significant stenosis.
2. The vertebral arteries are codominant and patent within the neck
bilaterally. Moderate/severe atherosclerotic narrowing at the origin
of the left vertebral artery.
3. Multifocal ill-defined and nodular opacities within the periphery
of the imaged lung apices which are likely infectious in etiology.
Septic emboli cannot be excluded.
4. Ill-defined soft tissue with irregular enhancement and regions of
internal hypodensity within the left neck measuring 3.9 x 1.9 x
cm. Findings are incompletely assessed on this arterial phase scan,
but suspicious for a residual/recurrent tumor deposit or nodal
conglomerate. Non-emergent contrast-enhanced neck CT is recommended
for further evaluation.

CTA head:

1. Probable fetal origin right posterior cerebral artery. The right
posterior communicating artery becomes occluded at its distal
aspect. No definite reconstitution of the right posterior cerebral
artery is identified more distally.
2. Fetal origin left posterior cerebral artery with high-grade focal
stenosis within the left posterior communicating artery.
3. Focal high-grade stenosis within the V4 right vertebral artery.
4. Atherosclerotic irregularity of the M2 and more distal MCA branch
vessels bilaterally. Most notably there are multifocal
moderate/severe stenoses within a proximal M2 right MCA branch.
5. Bulbous anterior communicating artery complex measuring up to
mm suspicious for aneurysm. Catheter-based angiography is
recommended for further evaluation, as clinically warranted.
6. An additional 1-2 mm saccular aneurysm of the V4 right vertebral
artery is questioned. This too could be further assessed at time of
catheter based angiography.

## 2019-06-22 IMAGING — MR MR HEAD W/O CM
8 of 10 series · 36 of 48 positions shown · non-contrast
Comparison: Noncontrast head CT performed earlier the same day
[DATE].

CLINICAL DATA: Focal neuro deficit, greater than 6 hours, stroke
suspected. Additional history provided: Patient presents to
emergency department from home with dizziness and weakness for the
past 3-4 days.

EXAM:
MRI HEAD WITHOUT CONTRAST
TECHNIQUE: Multiplanar, multiecho pulse sequences of the brain and surrounding
structures were obtained without intravenous contrast.

[Series 5: ax dwi_tracew · axial · 3.0mm · 0.60mm/px · z∈[-66,+88]mm · 8 of 96 slices shown]
[im 1/96]
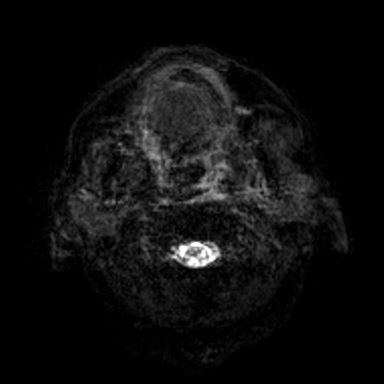
[im 11/96]
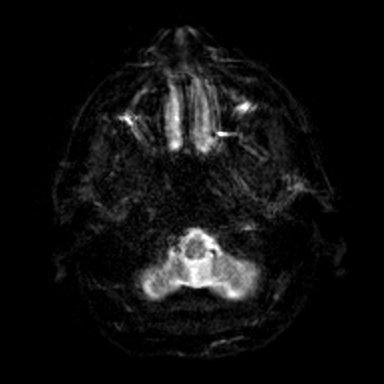
[im 32/96]
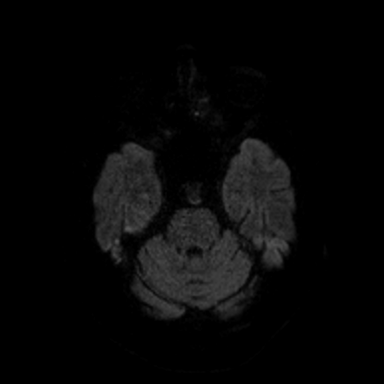
[im 43/96]
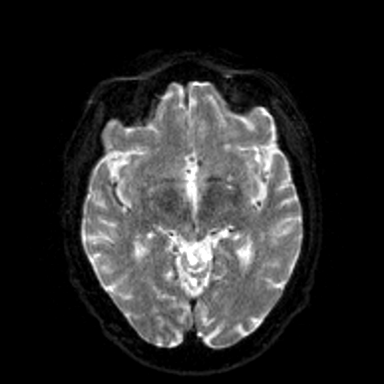
[im 53/96]
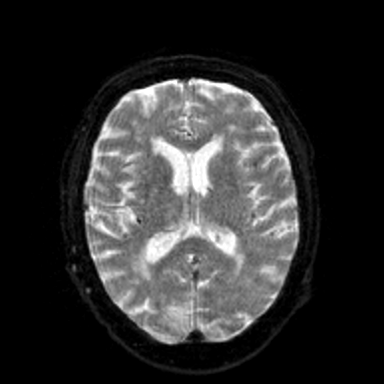
[im 64/96]
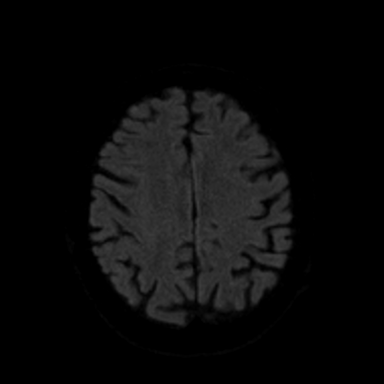
[im 85/96]
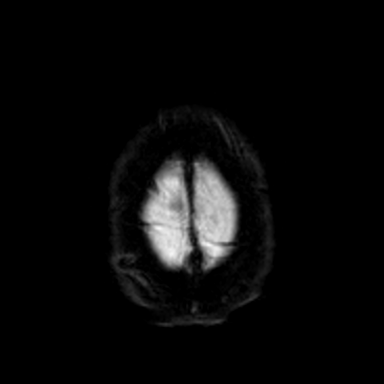
[im 96/96]
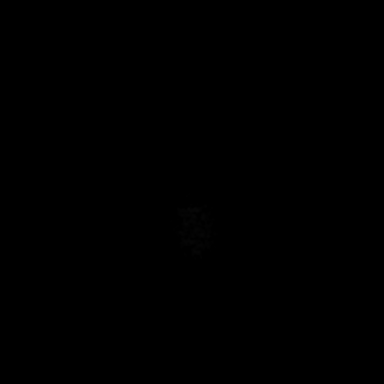

[Series 6: ax dwi_adc · axial · 3.0mm · 0.60mm/px · z∈[-66,+88]mm · 5 of 48 slices shown]
[im 1/48]
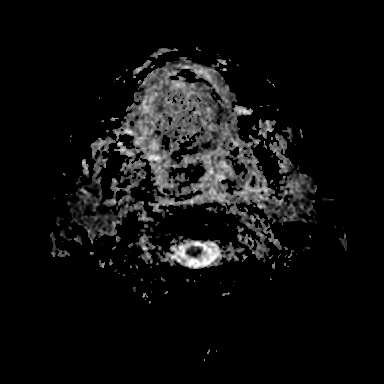
[im 12/48]
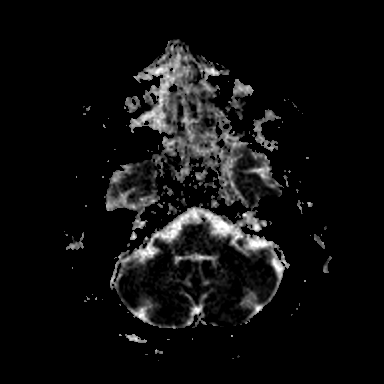
[im 24/48]
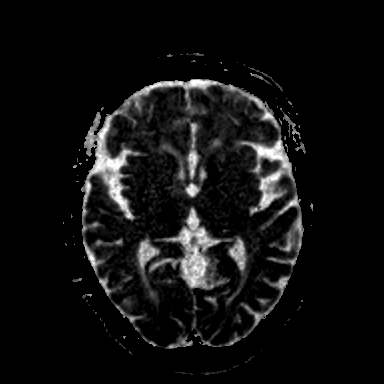
[im 36/48]
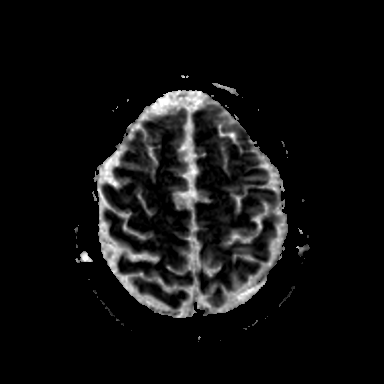
[im 48/48]
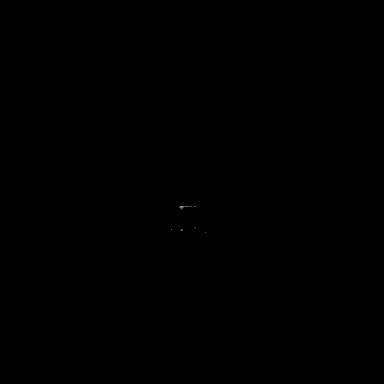

[Series 7: cor dwi_tracew · coronal · 5.0mm · 0.60mm/px · 5 of 76 slices shown]
[im 1/76]
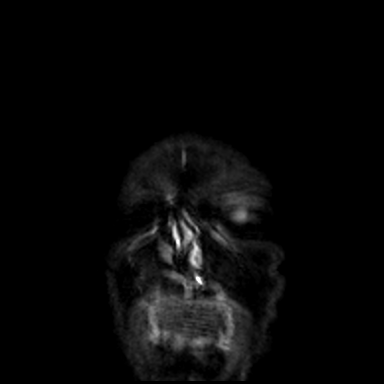
[im 11/76]
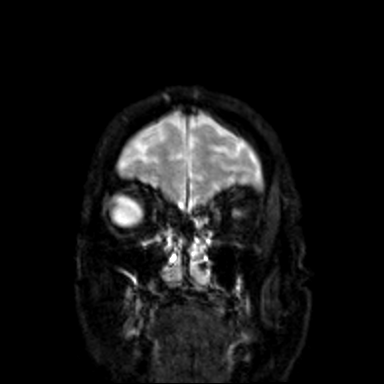
[im 22/76]
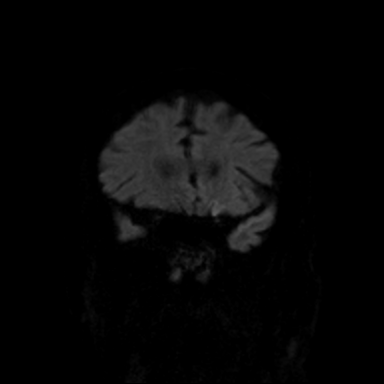
[im 33/76]
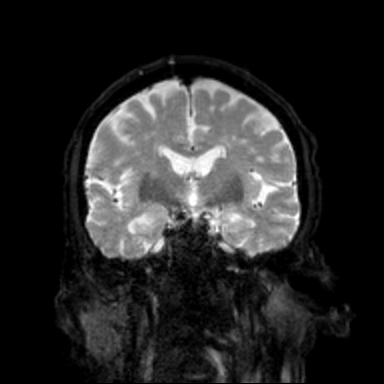
[im 43/76]
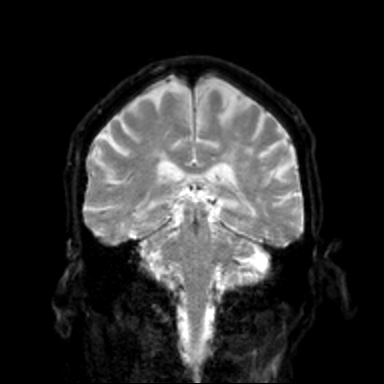

[Series 9: T1 · sagittal · 5.0mm · 0.62mm/px · 3 of 23 slices shown (1 of 2)]
[im 1/23]
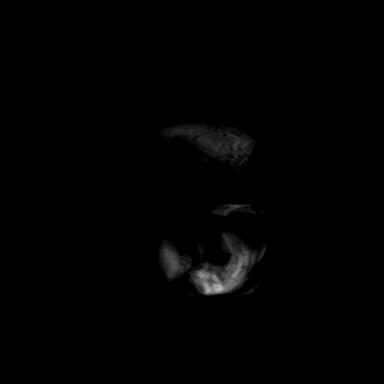
[im 12/23]
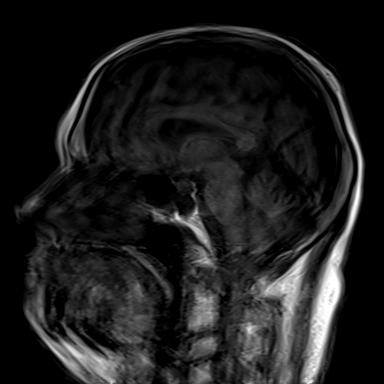
[im 23/23]
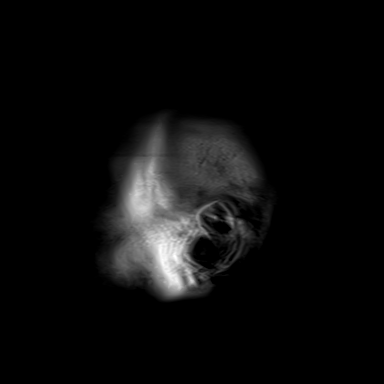

[Series 10: T2 · axial · 5.0mm · 0.53mm/px · z∈[-59,+84]mm · 3 of 25 slices shown (1 of 2)]
[im 1/25]
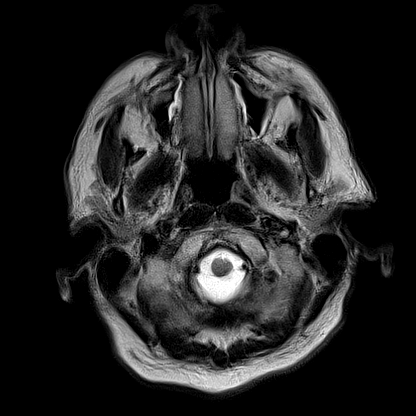
[im 13/25]
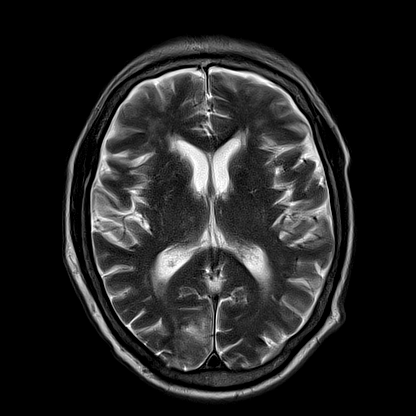
[im 25/25]
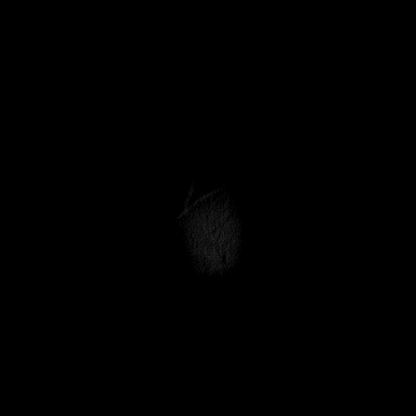

[Series 15: FLAIR · axial · 3.0mm · 1.20mm/px · z∈[-78,+84]mm · 6 of 55 slices shown]
[im 1/55]
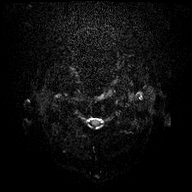
[im 11/55]
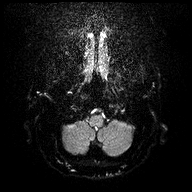
[im 22/55]
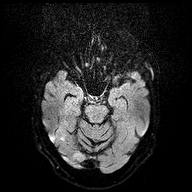
[im 33/55]
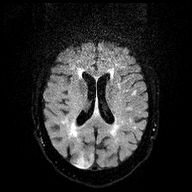
[im 44/55]
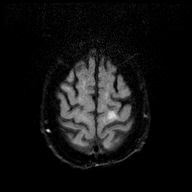
[im 55/55]
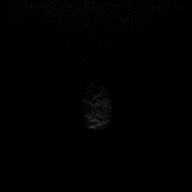

[Series 17: T2 · coronal · 5.0mm · 0.45mm/px · 3 of 29 slices shown (2 of 2)]
[im 1/29]
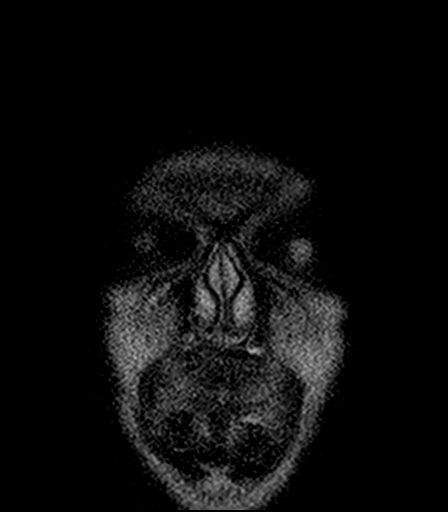
[im 15/29]
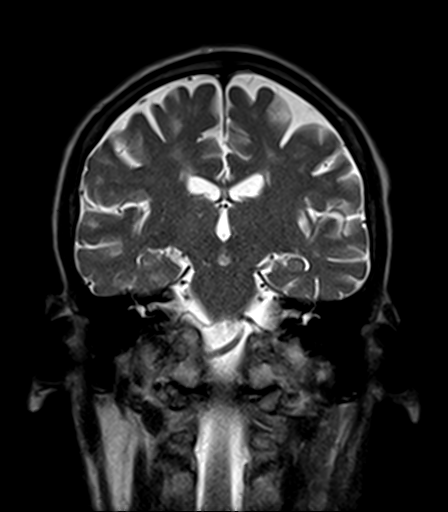
[im 29/29]
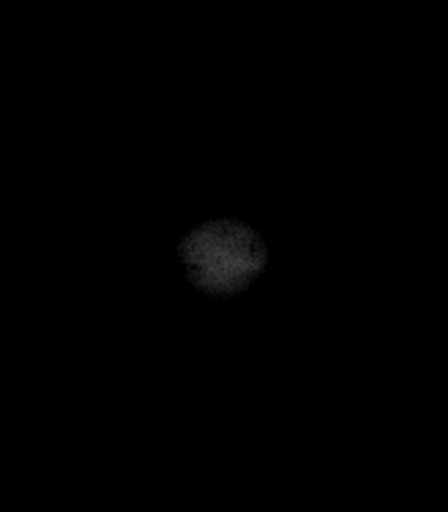

[Series 18: T1 · axial · 5.0mm · 0.90mm/px · z∈[-75,+81]mm · 3 of 27 slices shown (2 of 2)]
[im 1/27]
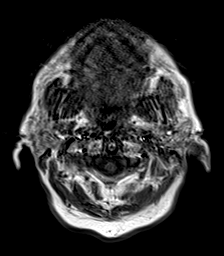
[im 14/27]
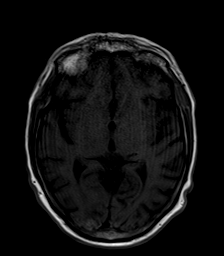
[im 27/27]
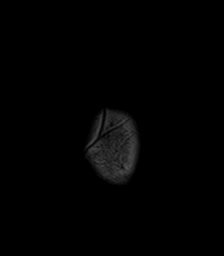

[36 of 48 positions shown; findings below may reference images not displayed]

FINDINGS: Brain:

The examination is intermittently motion degraded. Most notably
there is severe motion degradation of the sagittal T1 weighted
sequence, moderate motion degradation of the axial T2 weighted
sequence and moderate motion degradation of the axial T2/FLAIR
sequence.

There is patchy cortical/subcortical restricted diffusion within the
right PCA territory involving the right occipital lobe and to a
lesser degree the medial and posterior right temporal lobe. Findings
are consistent with acute/early subacute infarction. The largest
region of infarction within the right occipital lobe measures
cm. Additional acute/early subacute infarcts within the right
thalamus. Corresponding T2/FLAIR hyperintensity at these sites.
Additionally, there is a small amount of gyriform T1 hyperintensity
within the right occipital lobe consistent likely reflecting
petechial hemorrhage.

Redemonstrated small remote cortically based infarct within the
posterior left frontal lobe.

Moderate patchy T2/FLAIR hyperintensity within the cerebral white
matter is nonspecific, but consistent with chronic small vessel
ischemic disease. Mild generalized parenchymal atrophy. There is no
evidence of intracranial mass. No midline shift or extra-axial fluid
collection. No chronic intracranial blood products.

Vascular: No definite loss of expected flow voids within the
proximal large arterial vessels.

Skull and upper cervical spine: No focal marrow lesion is identified
within the limitations motion degraded imaging.

Sinuses/Orbits: Visualized orbits demonstrate no acute abnormality.
Paranasal sinus mucosal thickening greatest within the bilateral
ethmoid air cells. No significant mastoid effusion
IMPRESSION: 1. Motion degraded examination as described.
2. Multifocal changes of acute/early subacute infarction within the
right PCA territory involving the right occipital and temporal
lobes. Small amount of associated petechial hemorrhage within the
right occipital lobe. Acute/early subacute infarcts are also present
within the right thalamus.
3. Small chronic cortically based infarct within the posterior left
frontal lobe.
4. Moderate chronic small vessel ischemic disease.
5. Mild generalized parenchymal atrophy.
6. Paranasal sinus mucosal thickening.

## 2019-06-22 IMAGING — CT CT HEAD W/O CM
3 series · 15 of 47 positions shown, 18 images · non-contrast
Comparison: None.

CLINICAL DATA: Left extremity weakness. Left-sided numbness.

EXAM:
CT HEAD WITHOUT CONTRAST
TECHNIQUE: Contiguous axial images were obtained from the base of the skull
through the vertex without intravenous contrast.

[Series 3: head wo · axial · 0.41mm/px · z∈[-100,+30]mm · 9 of 32 slices shown, 12 images]
[im 3/32  brain]
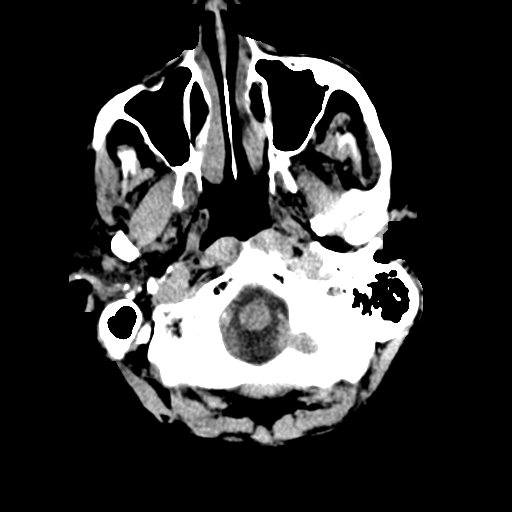
[im 3/32  bone]
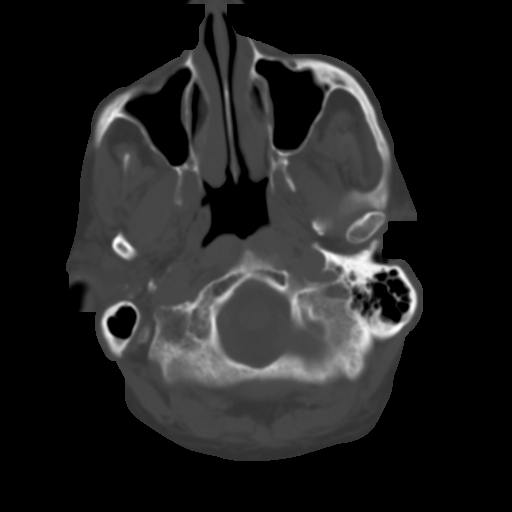
[im 6/32  brain]
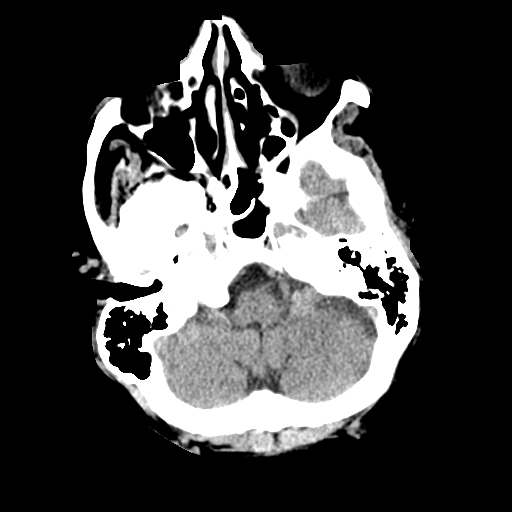
[im 9/32  brain]
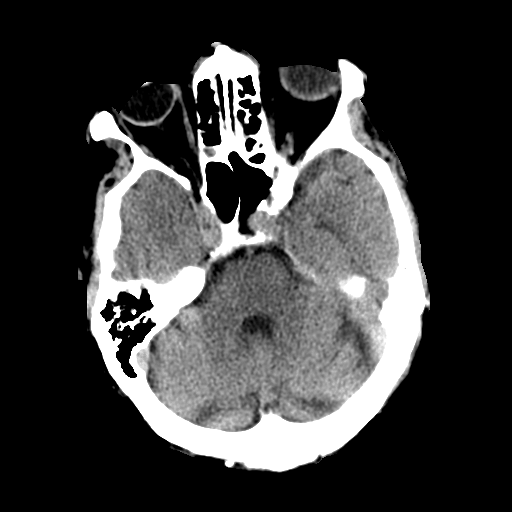
[im 12/32  brain]
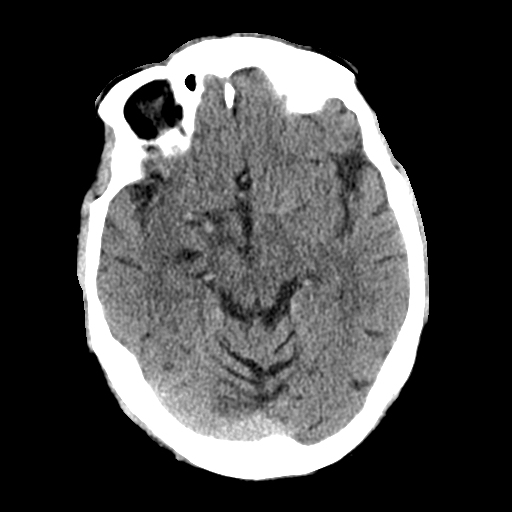
[im 17/32  brain]
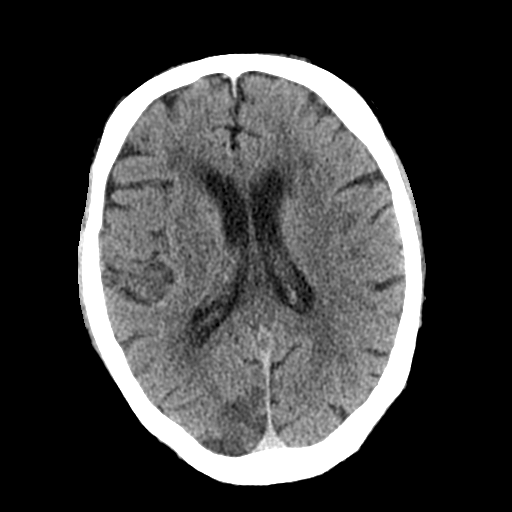
[im 17/32  bone]
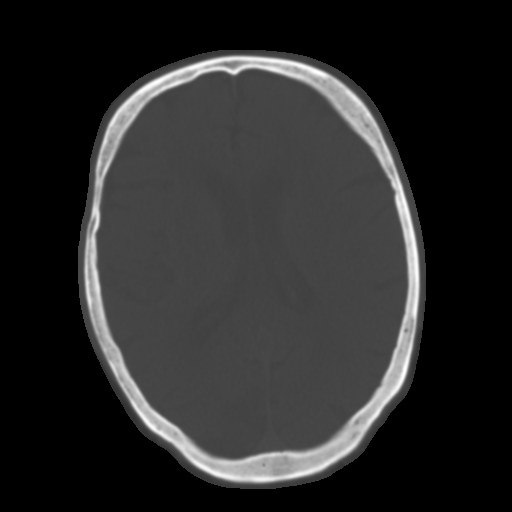
[im 20/32  brain]
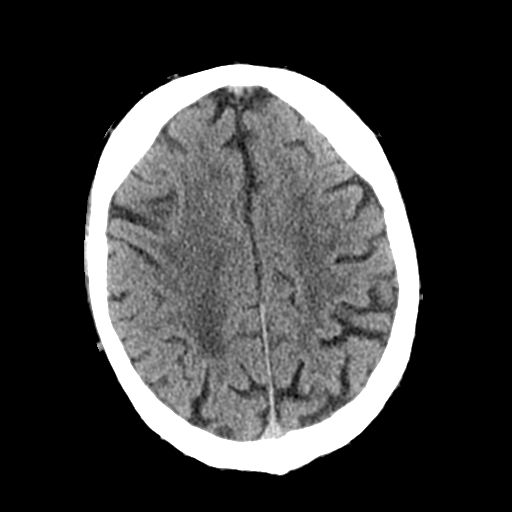
[im 23/32  brain]
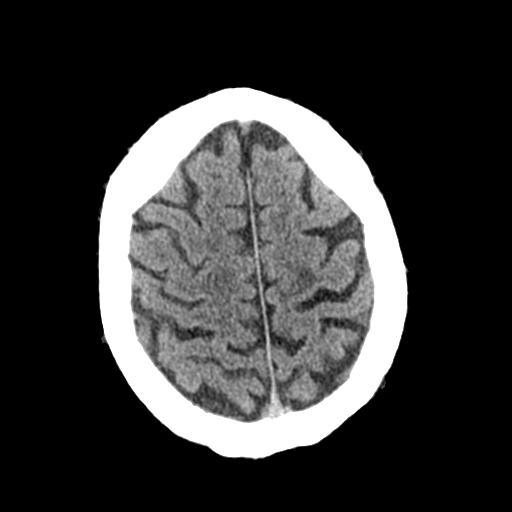
[im 26/32  brain]
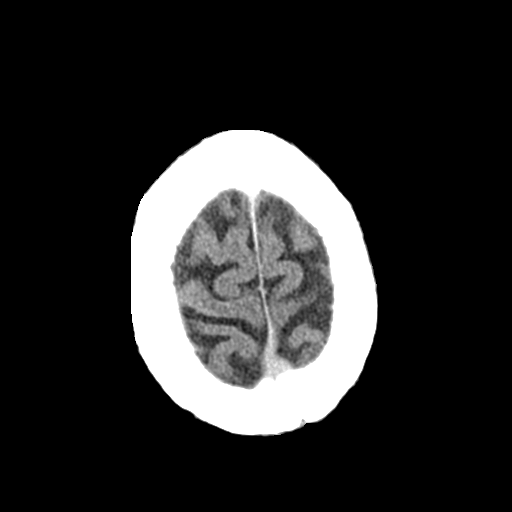
[im 29/32  brain]
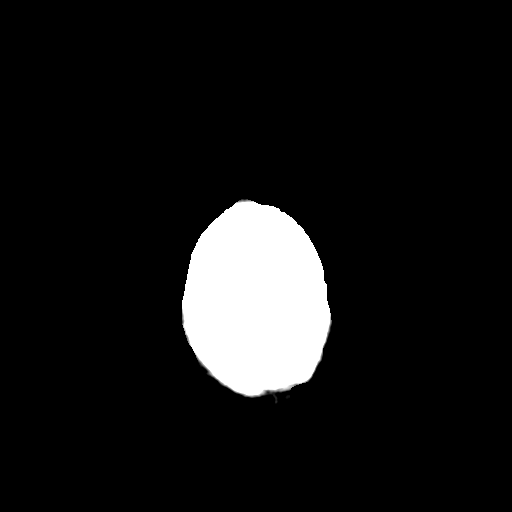
[im 29/32  bone]
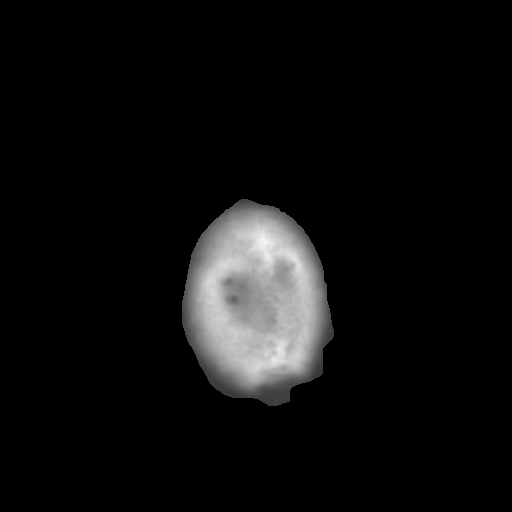

[Series 4: coronal soft tissue · coronal · 0.29mm/px · 3 of 64 slices shown]
[im 24/64  brain]
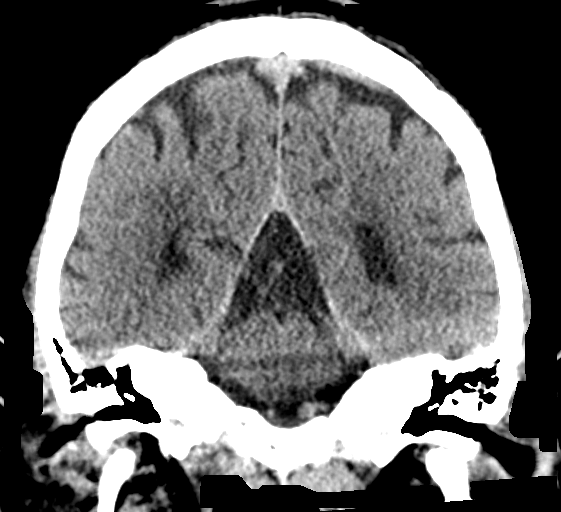
[im 29/64  brain]
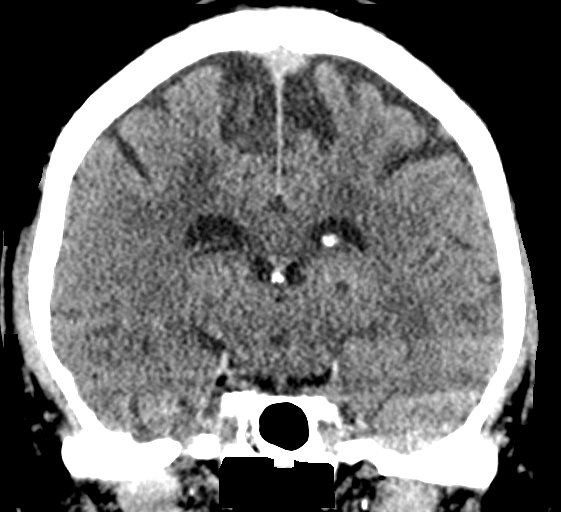
[im 35/64  brain]
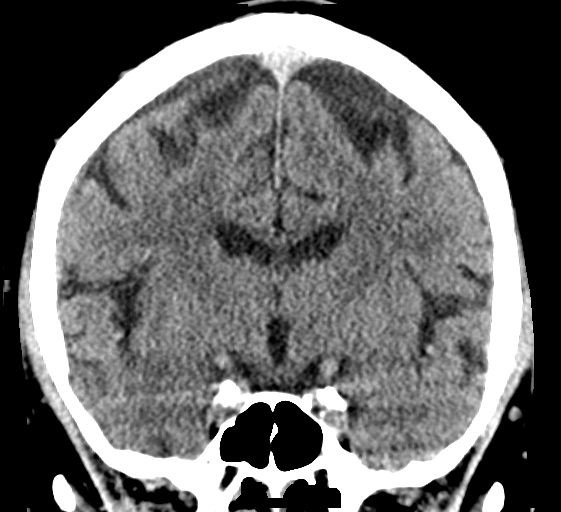

[Series 5: sagittal soft tissue · sagittal · 0.29mm/px · 3 of 54 slices shown]
[im 18/54  brain]
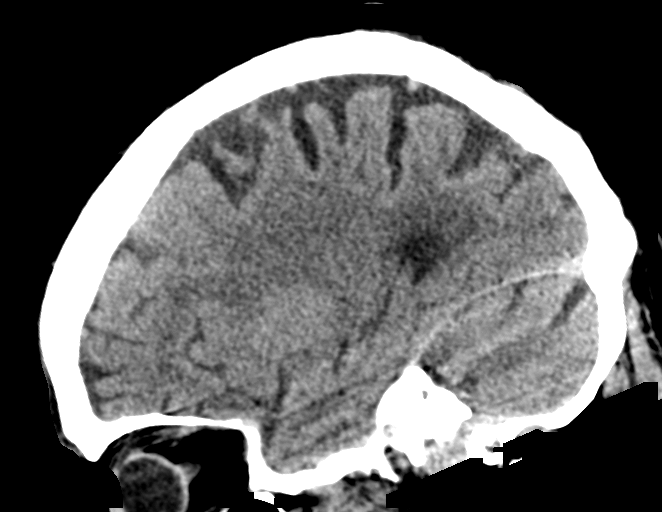
[im 27/54  brain]
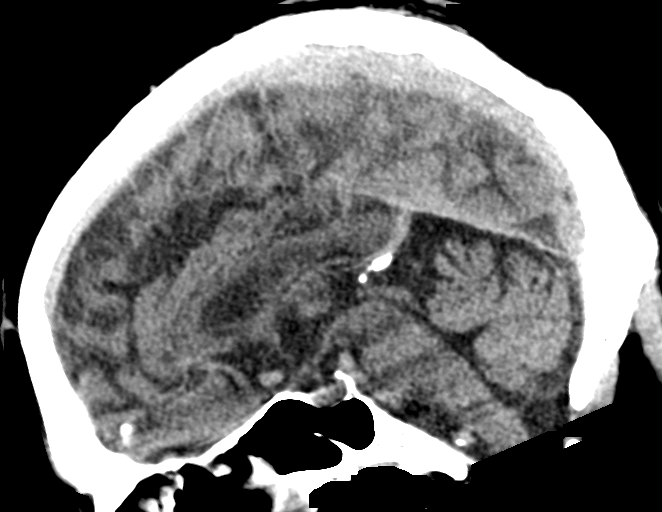
[im 36/54  brain]
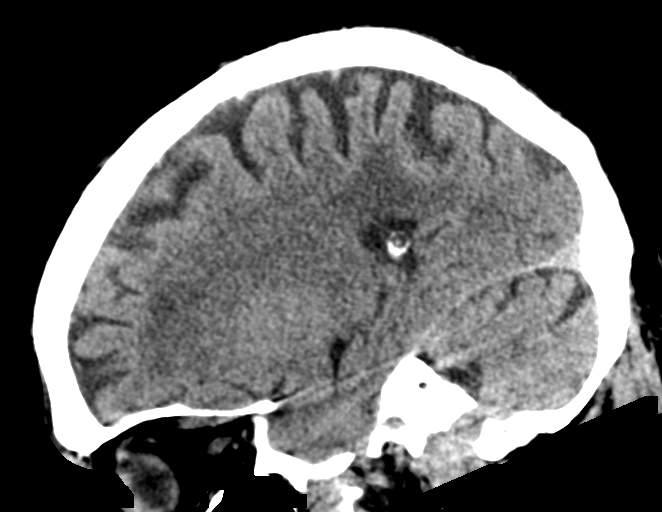

[15 of 47 positions shown; findings below may reference images not displayed]

FINDINGS: Brain: Focal hypoattenuation and loss of gray-white differentiation
is noted in the right occipital lobe. No hemorrhage or mass lesion
is present. Moderate diffuse white matter disease is present
bilaterally. The right insular cortex and basal ganglia are intact.
Remote left posterior frontal lobe infarct is present. The
ventricles are of normal size. No significant extraaxial fluid
collection is present.

Vascular: Atherosclerotic calcifications are present within the
cavernous internal carotid arteries bilaterally as well as in the
right vertebral artery. No hyperdense vessel is present.

Skull: Calvarium is intact. No focal lytic or blastic lesions are
present.

Sinuses/Orbits: Mild mucosal thickening present in maxillary sinuses
and ethmoid air cells bilaterally. No fluid levels are present. The
globes and orbits are within normal limits.
IMPRESSION: 1. Focal hypoattenuation and loss of gray-white differentiation in
the right occipital lobe is concerning for acute/subacute
nonhemorrhagic infarct.
2. Remote left posterior frontal lobe infarct.
3. Moderate diffuse white matter disease. This likely reflects the
sequela of chronic microvascular ischemia. Acute white matter
ischemia is not excluded.

These results were called by telephone at the time of interpretation
on [DATE] at [DATE] to provider [HOSPITAL] PRINCE RUBEL , who verbally
acknowledged these results.

## 2019-06-22 IMAGING — CT CT ANGIO HEAD
1 of 7 series · 6 of 33 positions shown · IV contrast (APPLIED)
Comparison: Brain MRI performed earlier the same day [DATE],
chest radiograph [DATE], PET-CT [DATE], neck CT [DATE]
COMPARISON: Brain MRI performed earlier the same day [DATE],
chest radiograph [DATE], PET-CT [DATE], neck CT [DATE]

Addendum:
CLINICAL DATA: Stroke, follow-up.

EXAM:
CT ANGIOGRAPHY HEAD AND NECK
TECHNIQUE: Multidetector CT imaging of the head and neck was performed using
the standard protocol during bolus administration of intravenous
contrast. Multiplanar CT image reconstructions and MIPs were
obtained to evaluate the vascular anatomy. Carotid stenosis
measurements (when applicable) are obtained utilizing NASCET
criteria, using the distal internal carotid diameter as the
denominator.
CONTRAST:  75mL OMNIPAQUE IOHEXOL 350 MG/ML SOLN

[Series 4: ax thin · axial · 0.43mm/px · z∈[-211,+34]mm · 6 of 344 slices shown]
[im 50/344  soft-tissue]
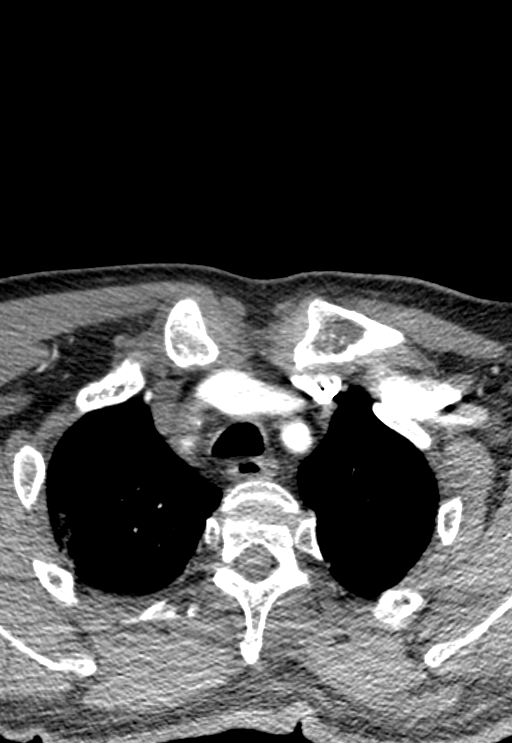
[im 99/344  bone]
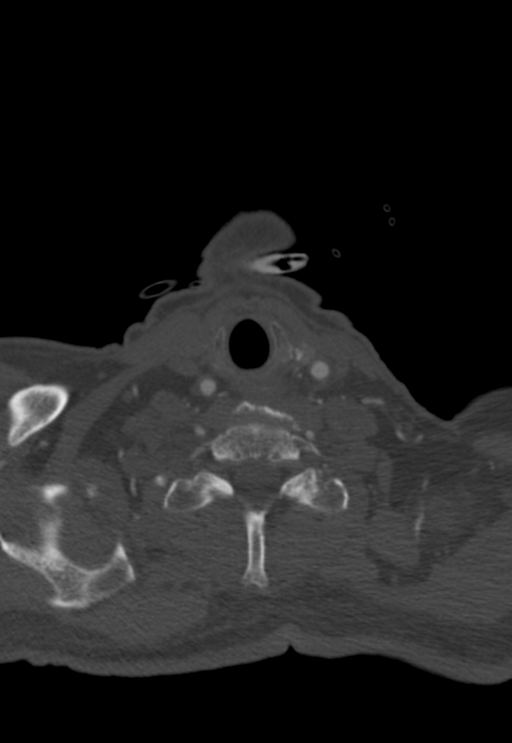
[im 148/344  soft-tissue]
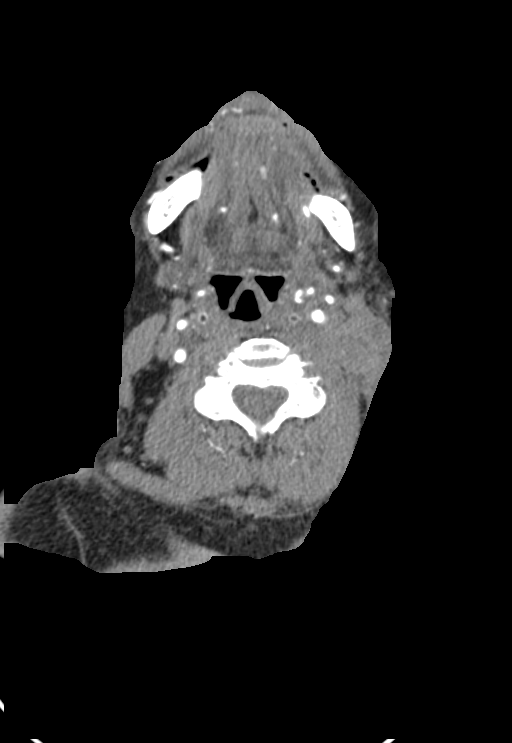
[im 197/344  bone]
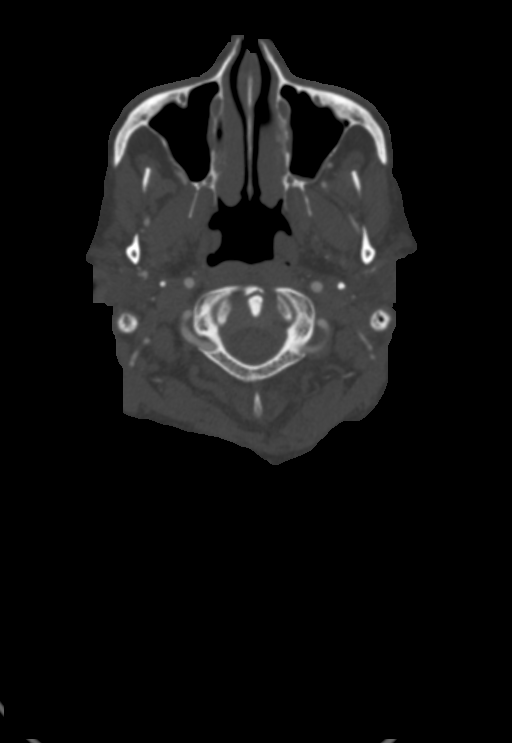
[im 246/344  soft-tissue]
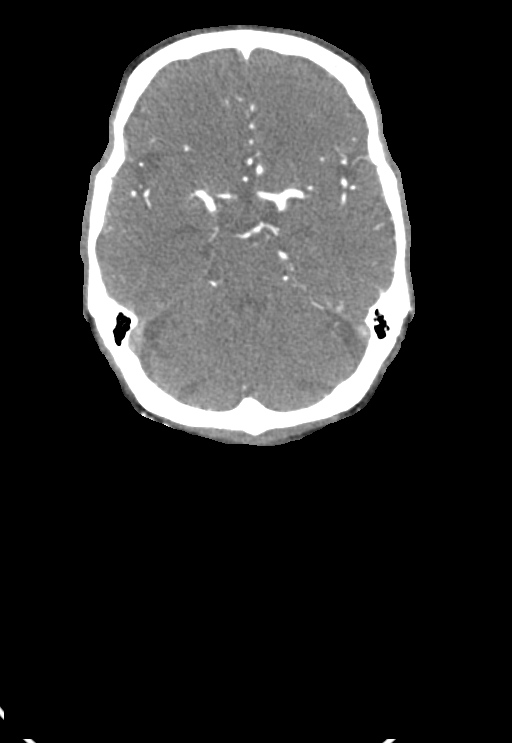
[im 295/344  bone]
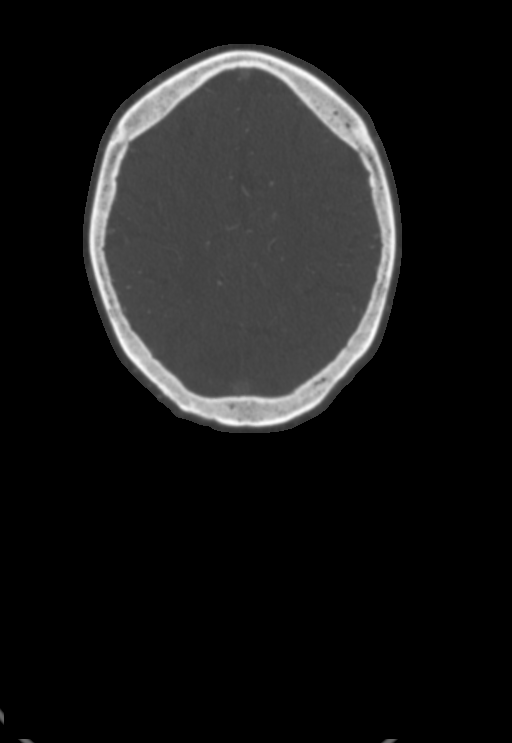

[6 of 33 positions shown; findings below may reference images not displayed]

FINDINGS: CT HEAD FINDINGS

Brain:

Acute/early subacute infarction changes within the right occipital
lobe have not significantly changed in extent. Additional foci of
acute/early subacute infarction within the medial and posterior
right temporal lobe and right thalamus, some of which was better
appreciated on same-day brain MRI. Subtle petechial hemorrhage
within the right occipital lobe was also better appreciated on this
prior exam.

No interval infarct is identified. Redemonstrated chronic cortically
based infarct within the posterior left frontal lobe. Stable
background moderate chronic small vessel ischemic disease and mild
generalized parenchymal atrophy. There is no evidence of
intracranial mass. No midline shift or extra-axial fluid collection

Vascular: Reported below.

Skull: Normal. Negative for fracture or focal lesion.

Sinuses: Mild ethmoid and maxillary sinus mucosal thickening. No
significant mastoid effusion.

Orbits: No acute abnormality.

Review of the MIP images confirms the above findings

CTA NECK FINDINGS

Aortic arch: Common origin of the innominate and left common carotid
arteries. The visualized aortic arch is unremarkable. No significant
innominate or proximal subclavian artery stenosis.

Right carotid system: CCA and ICA patent within the neck without
significant stenosis. Mild mixed plaque within the carotid
bifurcation

Left carotid system: CCA and ICA patent within the neck without
significant stenosis. Mild soft plaque within the CCA. Prominent
tortuosity of the proximal to mid ICA.

Vertebral arteries: The vertebral arteries are patent within the
neck bilaterally. Soft plaque results in moderate/severe stenosis at
the origin of the left vertebral artery.

Skeleton: No acute bony abnormality or aggressive osseous lesion.

Other neck: Ill ill-defined soft tissue prominence with irregular
enhancement and regions of internal hypodensity within the left
neck, closely related to the left sternocleidomastoid muscle. This
region is incompletely assessed on this arterial phase scan, but
measures approximately 3.9 x 1.9 x 5.4 cm (AP x TV x CC) (for
instance as seen on series 4, image 160 and series 5, image 161).
Findings are highly suspicious for a residual/recurrent tumor
deposit or nodal conglomerate. Surrounding soft tissue infiltration
is nonspecific but may reflect treatment related change.

Upper chest: There are multifocal ill-defined and nodular opacities
within the imaged lung apices which are likely infectious in
etiology. Septic emboli cannot be excluded.

Review of the MIP images confirms the above findings

CTA HEAD FINDINGS

Anterior circulation:

The intracranial right internal carotid artery is patent without
significant stenosis.

The intracranial left internal carotid artery is patent. Mild mixed
plaque within the paraclinoid segment with mild stenosis at this
site.

The M1 middle cerebral arteries are patent without significant
stenosis. No M2 proximal branch occlusion is identified.
Atherosclerotic irregularity of the M2 and more distal MCA branch
vessels bilaterally. Most notably, there are foci of moderate/severe
stenosis within a proximal M2 right MCA branch vessel (series 6,
image 17).

Hypoplastic A1 left ACA. The anterior cerebral arteries are patent
without high-grade proximal stenosis. There is a bulbous appearance
of the A-comm complex measuring 3.5 cm (series 4, image 246) (series
5, image 80).

Posterior circulation:

The intracranial vertebral arteries are patent bilaterally.
Moderate/severe focal atherosclerotic narrowing of the V4 segment on
the right. Immediately adjacent to this stenosis, there is a 1-2 mm
rounded focus of hyperdensity along the V4 segment (series 5, images
130 and 131). This may reflect a tiny aneurysm or eccentric focus of
calcified plaque.

The basilar artery is patent without significant stenosis.

Suspected fetal origin right posterior cerebral artery. The right
posterior communicating artery becomes occluded at its distal aspect
(series 7, images 97 and 98). No definite distal reconstitution of
the right posterior cerebral artery is identified.

There is a fetal origin left posterior cerebral artery, which is
patent. There is a high-grade focal stenosis within the left
posterior communicating artery (series 7, image 121).

Venous sinuses: Within limitations of contrast timing, no convincing
thrombus.

Anatomic variants: As described

Review of the MIP images confirms the above findings
IMPRESSION: CT head:

1. Acute/early subacute infarction changes within the right
occipital and temporal lobes, as well as right thalamus, have not
significantly changed in extent as compared to examinations
performed earlier the same day.
2. No interval intracranial abnormality is identified.
3. Stable background moderate chronic small vessel ischemic disease
and mild generalized parenchymal atrophy.

CTA neck:

1. The bilateral common and internal carotid arteries are patent
within the neck without significant stenosis.
2. The vertebral arteries are codominant and patent within the neck
bilaterally. Moderate/severe atherosclerotic narrowing at the origin
of the left vertebral artery.
3. Multifocal ill-defined and nodular opacities within the periphery
of the imaged lung apices which are likely infectious in etiology.
Septic emboli cannot be excluded.
4. Ill-defined soft tissue with irregular enhancement and regions of
internal hypodensity within the left neck measuring 3.9 x 1.9 x
cm. Findings are incompletely assessed on this arterial phase scan,
but suspicious for a residual/recurrent tumor deposit or nodal
conglomerate. Non-emergent contrast-enhanced neck CT is recommended
for further evaluation.

CTA head:

1. Probable fetal origin right posterior cerebral artery. The right
posterior communicating artery becomes occluded at its distal
aspect. No definite reconstitution of the right posterior cerebral
artery is identified more distally.
2. Fetal origin left posterior cerebral artery with high-grade focal
stenosis within the left posterior communicating artery.
3. Focal high-grade stenosis within the V4 right vertebral artery.
4. Atherosclerotic irregularity of the M2 and more distal MCA branch
vessels bilaterally. Most notably there are multifocal
moderate/severe stenoses within a proximal M2 right MCA branch.
5. Bulbous anterior communicating artery complex measuring up to
mm suspicious for aneurysm. Catheter-based angiography is
recommended for further evaluation, as clinically warranted.
6. An additional 1-2 mm saccular aneurysm of the V4 right vertebral
artery is questioned. This too could be further assessed at time of
catheter based angiography.

ADDENDUM:
These results were called by telephone at the time of interpretation
on [DATE] at [DATE] to provider TRUJILLO ROHENA , who verbally
acknowledged these results.

*** End of Addendum ***
FINDINGS: CT HEAD FINDINGS

Brain:

Acute/early subacute infarction changes within the right occipital
lobe have not significantly changed in extent. Additional foci of
acute/early subacute infarction within the medial and posterior
right temporal lobe and right thalamus, some of which was better
appreciated on same-day brain MRI. Subtle petechial hemorrhage
within the right occipital lobe was also better appreciated on this
prior exam.

No interval infarct is identified. Redemonstrated chronic cortically
based infarct within the posterior left frontal lobe. Stable
background moderate chronic small vessel ischemic disease and mild
generalized parenchymal atrophy. There is no evidence of
intracranial mass. No midline shift or extra-axial fluid collection

Vascular: Reported below.

Skull: Normal. Negative for fracture or focal lesion.

Sinuses: Mild ethmoid and maxillary sinus mucosal thickening. No
significant mastoid effusion.

Orbits: No acute abnormality.

Review of the MIP images confirms the above findings

CTA NECK FINDINGS

Aortic arch: Common origin of the innominate and left common carotid
arteries. The visualized aortic arch is unremarkable. No significant
innominate or proximal subclavian artery stenosis.

Right carotid system: CCA and ICA patent within the neck without
significant stenosis. Mild mixed plaque within the carotid
bifurcation

Left carotid system: CCA and ICA patent within the neck without
significant stenosis. Mild soft plaque within the CCA. Prominent
tortuosity of the proximal to mid ICA.

Vertebral arteries: The vertebral arteries are patent within the
neck bilaterally. Soft plaque results in moderate/severe stenosis at
the origin of the left vertebral artery.

Skeleton: No acute bony abnormality or aggressive osseous lesion.

Other neck: Ill ill-defined soft tissue prominence with irregular
enhancement and regions of internal hypodensity within the left
neck, closely related to the left sternocleidomastoid muscle. This
region is incompletely assessed on this arterial phase scan, but
measures approximately 3.9 x 1.9 x 5.4 cm (AP x TV x CC) (for
instance as seen on series 4, image 160 and series 5, image 161).
Findings are highly suspicious for a residual/recurrent tumor
deposit or nodal conglomerate. Surrounding soft tissue infiltration
is nonspecific but may reflect treatment related change.

Upper chest: There are multifocal ill-defined and nodular opacities
within the imaged lung apices which are likely infectious in
etiology. Septic emboli cannot be excluded.

Review of the MIP images confirms the above findings

CTA HEAD FINDINGS

Anterior circulation:

The intracranial right internal carotid artery is patent without
significant stenosis.

The intracranial left internal carotid artery is patent. Mild mixed
plaque within the paraclinoid segment with mild stenosis at this
site.

The M1 middle cerebral arteries are patent without significant
stenosis. No M2 proximal branch occlusion is identified.
Atherosclerotic irregularity of the M2 and more distal MCA branch
vessels bilaterally. Most notably, there are foci of moderate/severe
stenosis within a proximal M2 right MCA branch vessel (series 6,
image 17).

Hypoplastic A1 left ACA. The anterior cerebral arteries are patent
without high-grade proximal stenosis. There is a bulbous appearance
of the A-comm complex measuring 3.5 cm (series 4, image 246) (series
5, image 80).

Posterior circulation:

The intracranial vertebral arteries are patent bilaterally.
Moderate/severe focal atherosclerotic narrowing of the V4 segment on
the right. Immediately adjacent to this stenosis, there is a 1-2 mm
rounded focus of hyperdensity along the V4 segment (series 5, images
130 and 131). This may reflect a tiny aneurysm or eccentric focus of
calcified plaque.

The basilar artery is patent without significant stenosis.

Suspected fetal origin right posterior cerebral artery. The right
posterior communicating artery becomes occluded at its distal aspect
(series 7, images 97 and 98). No definite distal reconstitution of
the right posterior cerebral artery is identified.

There is a fetal origin left posterior cerebral artery, which is
patent. There is a high-grade focal stenosis within the left
posterior communicating artery (series 7, image 121).

Venous sinuses: Within limitations of contrast timing, no convincing
thrombus.

Anatomic variants: As described

Review of the MIP images confirms the above findings
IMPRESSION: CT head:

1. Acute/early subacute infarction changes within the right
occipital and temporal lobes, as well as right thalamus, have not
significantly changed in extent as compared to examinations
performed earlier the same day.
2. No interval intracranial abnormality is identified.
3. Stable background moderate chronic small vessel ischemic disease
and mild generalized parenchymal atrophy.

CTA neck:

1. The bilateral common and internal carotid arteries are patent
within the neck without significant stenosis.
2. The vertebral arteries are codominant and patent within the neck
bilaterally. Moderate/severe atherosclerotic narrowing at the origin
of the left vertebral artery.
3. Multifocal ill-defined and nodular opacities within the periphery
of the imaged lung apices which are likely infectious in etiology.
Septic emboli cannot be excluded.
4. Ill-defined soft tissue with irregular enhancement and regions of
internal hypodensity within the left neck measuring 3.9 x 1.9 x
cm. Findings are incompletely assessed on this arterial phase scan,
but suspicious for a residual/recurrent tumor deposit or nodal
conglomerate. Non-emergent contrast-enhanced neck CT is recommended
for further evaluation.

CTA head:

1. Probable fetal origin right posterior cerebral artery. The right
posterior communicating artery becomes occluded at its distal
aspect. No definite reconstitution of the right posterior cerebral
artery is identified more distally.
2. Fetal origin left posterior cerebral artery with high-grade focal
stenosis within the left posterior communicating artery.
3. Focal high-grade stenosis within the V4 right vertebral artery.
4. Atherosclerotic irregularity of the M2 and more distal MCA branch
vessels bilaterally. Most notably there are multifocal
moderate/severe stenoses within a proximal M2 right MCA branch.
5. Bulbous anterior communicating artery complex measuring up to
mm suspicious for aneurysm. Catheter-based angiography is
recommended for further evaluation, as clinically warranted.
6. An additional 1-2 mm saccular aneurysm of the V4 right vertebral
artery is questioned. This too could be further assessed at time of
catheter based angiography.

## 2019-06-22 MED ORDER — NICARDIPINE HCL IN NACL 20-0.86 MG/200ML-% IV SOLN
3.0000 mg/h | INTRAVENOUS | Status: DC
  Filled 2019-06-22: qty 200

## 2019-06-22 MED ORDER — ONDANSETRON HCL 4 MG/2ML IJ SOLN: 4.0000 mg | Freq: Three times a day (TID) | INTRAMUSCULAR | Status: DC | PRN

## 2019-06-22 MED ORDER — ZINC SULFATE 220 (50 ZN) MG PO CAPS
220.0000 mg | ORAL_CAPSULE | Freq: Every day | ORAL | Status: DC
  Administered 2019-06-22 – 2019-07-02 (×11): 220 mg via ORAL
  Filled 2019-06-22 (×11): qty 1

## 2019-06-22 MED ORDER — POTASSIUM CHLORIDE CRYS ER 20 MEQ PO TBCR
40.0000 meq | EXTENDED_RELEASE_TABLET | Freq: Once | ORAL | Status: AC
  Administered 2019-06-22: 40 meq via ORAL
  Filled 2019-06-22: qty 2

## 2019-06-22 MED ORDER — INSULIN ASPART 100 UNIT/ML ~~LOC~~ SOLN: 0.0000 [IU] | Freq: Every day | SUBCUTANEOUS | Status: DC

## 2019-06-22 MED ORDER — ASPIRIN 81 MG PO CHEW
324.0000 mg | CHEWABLE_TABLET | Freq: Once | ORAL | Status: AC
  Administered 2019-06-22: 324 mg via ORAL
  Filled 2019-06-22: qty 4

## 2019-06-22 MED ORDER — DM-GUAIFENESIN ER 30-600 MG PO TB12
1.0000 | ORAL_TABLET | Freq: Two times a day (BID) | ORAL | Status: DC | PRN
  Administered 2019-06-24 – 2019-06-26 (×3): 1 via ORAL
  Filled 2019-06-22 (×3): qty 1

## 2019-06-22 MED ORDER — ACETAMINOPHEN 325 MG PO TABS
650.0000 mg | ORAL_TABLET | Freq: Four times a day (QID) | ORAL | Status: DC | PRN
  Administered 2019-06-27: 05:00:00 650 mg via ORAL
  Filled 2019-06-22: qty 2

## 2019-06-22 MED ORDER — ASCORBIC ACID 500 MG PO TABS
500.0000 mg | ORAL_TABLET | Freq: Every day | ORAL | Status: DC
  Administered 2019-06-22 – 2019-07-02 (×11): 500 mg via ORAL
  Filled 2019-06-22 (×11): qty 1

## 2019-06-22 MED ORDER — ASPIRIN EC 81 MG PO TBEC
81.0000 mg | DELAYED_RELEASE_TABLET | Freq: Every day | ORAL | Status: DC
  Administered 2019-06-23 – 2019-07-05 (×13): 81 mg via ORAL
  Filled 2019-06-22 (×13): qty 1

## 2019-06-22 MED ORDER — LACTATED RINGERS IV BOLUS: 1000.0000 mL | Freq: Once | INTRAVENOUS | Status: DC

## 2019-06-22 MED ORDER — DOXYCYCLINE HYCLATE 100 MG PO TABS
100.0000 mg | ORAL_TABLET | Freq: Once | ORAL | Status: AC
  Administered 2019-06-22: 100 mg via ORAL
  Filled 2019-06-22: qty 1

## 2019-06-22 MED ORDER — INSULIN ASPART 100 UNIT/ML ~~LOC~~ SOLN
0.0000 [IU] | Freq: Three times a day (TID) | SUBCUTANEOUS | Status: DC
  Administered 2019-06-22 (×2): 1 [IU] via SUBCUTANEOUS
  Administered 2019-06-22: 2 [IU] via SUBCUTANEOUS
  Administered 2019-06-23: 3 [IU] via SUBCUTANEOUS
  Administered 2019-06-23: 2 [IU] via SUBCUTANEOUS
  Administered 2019-06-24: 3 [IU] via SUBCUTANEOUS
  Administered 2019-06-24 – 2019-06-25 (×3): 2 [IU] via SUBCUTANEOUS
  Administered 2019-06-25: 1 [IU] via SUBCUTANEOUS
  Administered 2019-06-25: 2 [IU] via SUBCUTANEOUS
  Administered 2019-06-26: 5 [IU] via SUBCUTANEOUS
  Administered 2019-06-26 (×2): 2 [IU] via SUBCUTANEOUS
  Administered 2019-06-27: 3 [IU] via SUBCUTANEOUS
  Administered 2019-06-27: 5 [IU] via SUBCUTANEOUS
  Administered 2019-06-27: 18:00:00 1 [IU] via SUBCUTANEOUS
  Administered 2019-06-28: 2 [IU] via SUBCUTANEOUS
  Administered 2019-06-28 – 2019-06-29 (×3): 1 [IU] via SUBCUTANEOUS
  Administered 2019-06-29 – 2019-06-30 (×2): 2 [IU] via SUBCUTANEOUS
  Administered 2019-06-30: 18:00:00 1 [IU] via SUBCUTANEOUS
  Administered 2019-07-01: 18:00:00 2 [IU] via SUBCUTANEOUS
  Administered 2019-07-02 (×2): 1 [IU] via SUBCUTANEOUS
  Administered 2019-07-03: 2 [IU] via SUBCUTANEOUS
  Administered 2019-07-04: 1 [IU] via SUBCUTANEOUS
  Filled 2019-06-22 (×30): qty 1

## 2019-06-22 MED ORDER — IPRATROPIUM BROMIDE HFA 17 MCG/ACT IN AERS
2.0000 | INHALATION_SPRAY | RESPIRATORY_TRACT | Status: DC
  Administered 2019-06-22 – 2019-07-05 (×60): 2 via RESPIRATORY_TRACT
  Filled 2019-06-22: qty 12.9

## 2019-06-22 MED ORDER — METHYLPREDNISOLONE SODIUM SUCC 40 MG IJ SOLR
40.0000 mg | Freq: Two times a day (BID) | INTRAMUSCULAR | Status: DC
  Administered 2019-06-22 – 2019-06-29 (×15): 40 mg via INTRAVENOUS
  Filled 2019-06-22 (×15): qty 1

## 2019-06-22 MED ORDER — SODIUM CHLORIDE 0.9 % IV SOLN
200.0000 mg | Freq: Once | INTRAVENOUS | Status: AC
  Administered 2019-06-22: 15:00:00 200 mg via INTRAVENOUS
  Filled 2019-06-22 (×2): qty 40

## 2019-06-22 MED ORDER — HYDRALAZINE HCL 20 MG/ML IJ SOLN
5.0000 mg | INTRAMUSCULAR | Status: DC | PRN
  Filled 2019-06-22: qty 1

## 2019-06-22 MED ORDER — ALBUTEROL SULFATE HFA 108 (90 BASE) MCG/ACT IN AERS
2.0000 | INHALATION_SPRAY | RESPIRATORY_TRACT | Status: DC | PRN
  Administered 2019-07-03 – 2019-07-05 (×4): 2 via RESPIRATORY_TRACT
  Filled 2019-06-22: qty 6.7

## 2019-06-22 MED ORDER — SODIUM CHLORIDE 0.9 % IV SOLN
100.0000 mg | Freq: Every day | INTRAVENOUS | Status: AC
  Administered 2019-06-23 – 2019-06-26 (×4): 100 mg via INTRAVENOUS
  Filled 2019-06-22 (×2): qty 20
  Filled 2019-06-22: qty 100
  Filled 2019-06-22: qty 20

## 2019-06-22 MED ORDER — ENOXAPARIN SODIUM 40 MG/0.4ML ~~LOC~~ SOLN
40.0000 mg | SUBCUTANEOUS | Status: DC
  Administered 2019-06-22 – 2019-07-05 (×13): 40 mg via SUBCUTANEOUS
  Filled 2019-06-22 (×14): qty 0.4

## 2019-06-22 MED ORDER — SODIUM CHLORIDE 0.9 % IV SOLN
1.0000 g | Freq: Once | INTRAVENOUS | Status: AC
  Administered 2019-06-22: 1 g via INTRAVENOUS
  Filled 2019-06-22: qty 10

## 2019-06-22 MED ORDER — ATORVASTATIN CALCIUM 20 MG PO TABS
40.0000 mg | ORAL_TABLET | Freq: Every day | ORAL | Status: DC
  Administered 2019-06-22 – 2019-07-05 (×14): 40 mg via ORAL
  Filled 2019-06-22 (×14): qty 2

## 2019-06-22 MED ORDER — SENNOSIDES-DOCUSATE SODIUM 8.6-50 MG PO TABS
1.0000 | ORAL_TABLET | Freq: Every evening | ORAL | Status: DC | PRN
  Administered 2019-06-25 – 2019-06-28 (×3): 1 via ORAL
  Filled 2019-06-22 (×4): qty 1

## 2019-06-22 MED ORDER — IOHEXOL 350 MG/ML SOLN
75.0000 mL | Freq: Once | INTRAVENOUS | Status: AC | PRN
  Administered 2019-06-22: 75 mL via INTRAVENOUS

## 2019-06-22 MED ORDER — STROKE: EARLY STAGES OF RECOVERY BOOK: Freq: Once | Status: AC

## 2019-06-22 NOTE — ED Provider Notes (Signed)
[facility] Emergency Department Provider Note  ____________________________________________  Time seen: Approximately 5:28 AM  I have reviewed the triage vital signs and the nursing notes.   HISTORY  Chief Complaint Extremity Weakness   HPI Kent [name] is a 64 y.o. male with a history of diabetes, hypertension, hyperlipidemia, chronic kidney disease who presents for evaluation of generalized weakness.  Patient was here 7 hours ago and left without being seen.  Had labs done which were unremarkable.  He reports being diagnosed with Covid a days ago and has been feeling very weak since then.  Is complaining of feeling very tired and sleeping all the time.  For the last 3 days he has been feeling dizzy freshly when he stands up and walks.  He endorses compliance with his medications.  Denies chest pain or shortness of breath, cough, fever, abdominal pain, nausea or vomiting, dysuria or hematuria.   Past Medical History:  Diagnosis Date  . Arthritis   . Diabetes mellitus without complication ([place])   . Hyperlipidemia   . Hypertension   . Obesity   . Osteomyelitis ([place])    From pt chart  . Renal insufficiency    CKD noted on chart  . Staph infection 05/29/2016   Nov 2017. Staph infection in LT shoulder requiring debridement and grafts  . Status post debridement   . Stomach cancer ([place])    had tumor removed- [facility]  . 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 Generalized weakness 06/22/2019  . Hypertensive urgency 06/22/2019  . Acute CVA (cerebrovascular accident) ([place]) 06/22/2019  . Acute respiratory failure ([place]) 06/22/2019  . Right upper lobe pneumonia 06/22/2019  . Pulmonary nodules 07/12/2016  . H/O necrotizing fasciitis 05/18/2016  . Oral pain of unknown etiology 05/18/2016  . H/O osteomyelitis 05/18/2016  . Sepsis ([place]) 01/08/2016  .  Advance care planning 01/07/2016  . Tachycardia 01/07/2016  . Dry gangrene ([place]) 01/04/2016  . Hypokalemia 12/21/2015  . Protein-calorie malnutrition, severe 12/11/2015  . Electrolyte imbalance 12/11/2015  . Intractable nausea and vomiting 12/10/2015  . Dehydration 11/16/2015  . Odynophagia 11/16/2015  . Anemia 11/16/2015  . Thrombocytopenia ([place]) 11/16/2015  . Hyponatremia 11/12/2015  . Hypomagnesemia 11/12/2015  . Cancer associated pain 10/22/2015  . Carcinoma of base of tongue ([place]) 09/10/2015  . Low back pain 04/06/2015  . Hypertension 12/05/2014  . Hypertensive CKD (chronic kidney disease) 12/05/2014  . Hyperlipidemia 12/05/2014  . Class II obesity 12/05/2014  . Erectile dysfunction 12/05/2014  . Nonalcoholic steatohepatitis 42/87/6811    Past Surgical History:  Procedure Laterality Date  . EXCISION OF TONGUE LESION Left 06/19/2016   Procedure: EXCISION OF TONGUE LESION;  Surgeon: Margaretha Sheffield, MD;  Location: [place];  Service: ENT;  Laterality: Left;  diabetic - insulin and oral meds - not using either currently  . gastric tumor removed  2010  . I & D EXTREMITY Left 01/09/2016   Procedure: IRRIGATION AND DEBRIDEMENT EXTREMITY;  Surgeon: Robert Bellow, MD;  Location: [facility];  Service: General;  Laterality: Left;  . LARYNGOSCOPY N/A 06/19/2016   Procedure: LARYNGOSCOPY;  Surgeon: Margaretha Sheffield, MD;  Location: [place];  Service: ENT;  Laterality: N/A;  . PERIPHERAL VASCULAR CATHETERIZATION Right 12/31/2015   Procedure: Lower Extremity Angiography;  Surgeon: Algernon Huxley, MD;  Location: [facility] [facility];  Service: Cardiovascular;  Laterality: Right;  . tongue mass biopsy      Prior to Admission medications   Medication Sig Start Date End Date Taking? Authorizing Provider  amoxicillin (AMOXIL) 500 MG capsule Take 1 capsule (500 mg total) by mouth 3 (three) times daily. 07/28/17   Sable Feil, PA-C  aspirin EC 81 MG tablet Take 81 mg by mouth  daily as needed for moderate pain.    [provider]  bacitracin 500 UNIT/GM ointment Apply topically. 01/22/16   [provider]  cetirizine (ZYRTEC) 10 MG tablet Take 1 tablet (10 mg total) by mouth daily. Patient not taking: Reported on 05/09/2016 02/11/16   Volney American, PA-C  fexofenadine-pseudoephedrine (ALLEGRA-D) 60-120 MG 12 hr tablet Take 1 tablet by mouth 2 (two) times daily. 07/28/17   Sable Feil, PA-C  fluticasone (FLONASE) 50 MCG/ACT nasal spray Place 2 sprays into both nostrils daily. Patient not taking: Reported on 05/09/2016 02/11/16   Volney American, PA-C  HYDROcodone-acetaminophen (NORCO/VICODIN) 5-325 MG tablet Take 1 tablet by mouth every 4 (four) hours as needed.  06/19/16   [provider]  lisinopril (PRINIVIL,ZESTRIL) 20 MG tablet Take 1 tablet (20 mg total) by mouth daily. 08/07/16   Kathrine Haddock, NP  magic mouthwash SOLN Take 5 mLs by mouth 4 (four) times daily as needed for mouth pain. Patient not taking: Reported on 08/12/2016 12/03/15   Lequita Asal, MD  magic mouthwash w/lidocaine SOLN Take 5 mLs by mouth 4 (four) times daily. 07/28/17   Sable Feil, PA-C  metFORMIN (GLUCOPHAGE) 1000 MG tablet Take 1,000 mg by mouth daily. 07/17/16   [provider]  potassium chloride SA (K-DUR,KLOR-CON) 20 MEQ tablet 20 meq (1 pill) by mouth twice today then begin 1 pill a day beginning 05/20/2016 05/19/16   Lequita Asal, MD    Allergies Patient has no known allergies.  Family History  Problem Relation Age of Onset  . Heart disease Mother   . Hypertension Father   . Cancer Sister        stomach  . Heart disease Brother        MI  . Cancer Daughter   . Cancer Son        testicular  . ADD / ADHD Sister     Social History Social History   Tobacco Use  . Smoking status: Never Smoker  . Smokeless tobacco: Never Used  Substance Use Topics  . Alcohol use: No    Alcohol/week: 0.0 standard drinks  .  Drug use: No    Review of Systems  Constitutional: Negative for fever. + generalized weakness, fatigue Eyes: Negative for visual changes. ENT: Negative for sore throat. Neck: No neck pain  Cardiovascular: Negative for chest pain. Respiratory: Negative for shortness of breath. Gastrointestinal: Negative for abdominal pain, vomiting or diarrhea. Genitourinary: Negative for dysuria. Musculoskeletal: Negative for back pain. Skin: Negative for rash. Neurological: Negative for headaches, weakness or numbness. Psych: No SI or HI  ____________________________________________   PHYSICAL EXAM:  VITAL SIGNS: ED Triage Vitals  Enc Vitals Group     BP 06/22/19 0522 (S) (!) 201/141     Pulse Rate 06/22/19 0522 (!) 107     Resp 06/22/19 0522 17     Temp 06/22/19 0522 98.7 F (37.1 C)     Temp Source 06/22/19 0522 Oral     SpO2 06/22/19 0517 96 %     Weight 06/22/19 0523 180 lb (81.6 kg)     Height  06/22/19 0523 5\' 7"  (1.702 m)     Head Circumference --      Peak Flow --      Pain Score 06/22/19 0523 0     Pain Loc --      Pain Edu? --      Excl. in [place]? --     Constitutional: Alert and oriented, slurring speech, seems intoxicated.  HEENT:      Head: Normocephalic and atraumatic.         Eyes: Conjunctivae are normal. Sclera is non-icteric.       Mouth/Throat: Mucous membranes are moist.       Neck: Supple with no signs of meningismus. Cardiovascular: Regular rate and rhythm. No murmurs, gallops, or rubs. 2+ symmetrical distal pulses are present in all extremities. No JVD. Respiratory: Normal respiratory effort. Lungs are clear to auscultation bilaterally. No wheezes, crackles, or rhonchi.  Gastrointestinal: Soft, non tender, and non distended  Musculoskeletal:  No edema, cyanosis, or erythema of extremities. Neurologic: Slurred speech. Face is symmetric. Pronator drift as dysmetria on the RUE, 4/5 strength, reports numbness but no extinction. Normal strength and sensation of  other 3 extremities Skin: Skin is warm, dry and intact. No rash noted. Psychiatric: Mood and affect are normal. Speech and behavior are normal.  ____________________________________________   LABS (all labs ordered are listed, but only abnormal results are displayed)  Labs Reviewed  RESPIRATORY PANEL BY RT PCR (FLU A&B, COVID)  CULTURE, BLOOD (ROUTINE X 2)  CULTURE, BLOOD (ROUTINE X 2)  ETHANOL  URINE DRUG SCREEN, QUALITATIVE ([facility] ONLY)  PROTIME-INR  APTT  BASIC METABOLIC PANEL  LACTIC ACID, PLASMA  PROCALCITONIN  ____________________________________________  EKG   ED ECG REPORT I, Rudene Re, the attending physician, personally viewed and interpreted this ECG.  Normal sinus rhythm, rate of 97, normal intervals, normal axis, no ST elevations or depressions.  Normal EKG.  ____________________________________________  RADIOLOGY  I have personally reviewed the images performed during this visit and I agree with the Radiologist's read.   Interpretation by Radiologist:  CT Head Wo Contrast  Result Date: 06/22/2019 CLINICAL DATA:  Left extremity weakness. Left-sided numbness. EXAM: CT HEAD WITHOUT CONTRAST TECHNIQUE: Contiguous axial images were obtained from the base of the skull through the vertex without intravenous contrast. COMPARISON:  None. FINDINGS: Brain: Focal hypoattenuation and loss of gray-white differentiation is noted in the right occipital lobe. No hemorrhage or mass lesion is present. Moderate diffuse white matter disease is present bilaterally. The right insular cortex and basal ganglia are intact. Remote left posterior frontal lobe infarct is present. The ventricles are of normal size. No significant extraaxial fluid collection is present. Vascular: Atherosclerotic calcifications are present within the cavernous internal carotid arteries bilaterally as well as in the right vertebral artery. No hyperdense vessel is present. Skull: Calvarium is intact. No  focal lytic or blastic lesions are present. Sinuses/Orbits: Mild mucosal thickening present in maxillary sinuses and ethmoid air cells bilaterally. No fluid levels are present. The globes and orbits are within normal limits. IMPRESSION: 1. Focal hypoattenuation and loss of gray-white differentiation in the right occipital lobe is concerning for acute/subacute nonhemorrhagic infarct. 2. Remote left posterior frontal lobe infarct. 3. Moderate diffuse white matter disease. This likely reflects the sequela of chronic microvascular ischemia. Acute white matter ischemia is not excluded. These results were called by telephone at the time of interpretation on 06/22/2019 at 5:47 am to provider [facility] , who verbally acknowledged these results. Electronically Signed   By: Harrell Gave  Mattern M.D.   On: 06/22/2019 05:47   DG Chest Portable 1 View  Result Date: 06/22/2019 CLINICAL DATA:  Hypoxia. COVID positive. EXAM: PORTABLE CHEST 1 VIEW COMPARISON:  One-view chest x-ray 06/19/2019 FINDINGS: Heart size is normal. Patchy peripheral airspace disease is present in the right upper lobe. No significant consolidation is present. No effusions are present. Bony thorax is within normal limits. IMPRESSION: Patchy peripheral airspace disease in the right upper lobe compatible with pneumonia. Electronically Signed   By: San Morelle M.D.   On: 06/22/2019 06:14      ____________________________________________   PROCEDURES  Procedure(s) performed:yes .1-3 Lead EKG Interpretation Performed by: Rudene Re, MD Authorized by: Rudene Re, MD     Interpretation: normal     ECG rate:  90   ECG rate assessment: normal     Rhythm: sinus rhythm     Ectopy: none     Conduction: normal     Critical Care performed: yes  CRITICAL CARE Performed by: Rudene Re  ?  Total critical care time: 35 min  Critical care time was exclusive of separately billable procedures and treating  other patients.  Critical care was necessary to treat or prevent imminent or life-threatening deterioration.  Critical care was time spent personally by me on the following activities: development of treatment plan with patient and/or surrogate as well as nursing, discussions with consultants, evaluation of patient's response to treatment, examination of patient, obtaining history from patient or surrogate, ordering and performing treatments and interventions, ordering and review of laboratory studies, ordering and review of radiographic studies, pulse oximetry and re-evaluation of patient's condition.  ____________________________________________   INITIAL IMPRESSION / ASSESSMENT AND PLAN / ED COURSE   64 y.o. male with a history of diabetes, hypertension, hyperlipidemia, chronic kidney disease who presents for evaluation of generalized weakness and dizziness x 3-4 days after being diagnosed with Covid 8 days ago. Patient seems intoxicated, slurring his speech. No documented covid result on Epic.  On exam he has severely elevated BP and RU dysmetria, weakness, and pronator drift. Patient is not sure when these symptoms started. Patient never complained of any problems with his arm until I examined him therefore not sure what the timeline for onset of symptoms is but at least 5 hours now. Labs from visit  7 hours ago unremarkable with no evidence of dehydration, DKA, anemia, sepsis, AKI.  Old medical records have been reviewed. CT head pending. CBG 223. Presentation concerning for stroke.  _________________________ 5:53 AM on 06/22/2019 -----------------------------------------  CT concerning for acute/subacute right occipital lobe infarct.  CT visualized and read by me as well.  Will allow elevated BP for permissive hypertension in the setting of ischemic stroke. Unknown last normal therefore no indication for tPA. Nicardipine ordered in case BP >220/120. Full ASA given. Patient also noted to be  hypoxic to 86%, denies SOB. Patient placed on O2. CXR pending. Patient is very confused with history. First said he was + for Covid 1 week ago and then told me that someone in [place] told him his Covid was negative. No available Covid result on Epic, repeat covid/ flu pending. If positive will initiate decadron and remdesivir. MRI ordered. WIll consult hospitalist for admission.   _________________________ 6:30 AM on 06/22/2019 -----------------------------------------  Chest x-ray visualized and interpreted by me showing concerns for pneumonia which is confirmed by radiology.  We will send a procalcitonin.  Covid and flu swab are pending.  Will treat with Rocephin and doxycycline for community-acquired pneumonia.  Discussed with Dr. Damita Dunnings who accepted patient to her service      _____________________________________________ Please note:  Patient was evaluated in Emergency Department today for the symptoms described in the history of present illness. Patient was evaluated in the context of the global COVID-19 pandemic, which necessitated consideration that the patient might be at risk for infection with the SARS-CoV-2 virus that causes COVID-19. Institutional protocols and algorithms that pertain to the evaluation of patients at risk for COVID-19 are in a state of rapid change based on information released by regulatory bodies including the CDC and federal and state organizations. These policies and algorithms were followed during the patient's care in the ED.  Some ED evaluations and interventions may be delayed as a result of limited staffing during the pandemic.   [place] Controlled Substance Database was reviewed by me. ____________________________________________   FINAL CLINICAL IMPRESSION(S) / ED DIAGNOSES   Final diagnoses:  Ischemic cerebrovascular accident (CVA) ([place])  Acute respiratory failure with hypoxia ([place])  Community acquired pneumonia of right upper lobe of lung      NEW  MEDICATIONS STARTED DURING THIS VISIT:  ED Discharge Orders    None       Note:  This document was prepared using Dragon voice recognition software and may include unintentional dictation errors.    Alfred Levins, [place], MD 06/22/19 [phone]

## 2019-06-22 NOTE — TOC Initial Note (Signed)
Transition of Care [facility]) - Initial/Assessment Note    Patient Details  Name: Kent [name] MRN: [id number] Date of Birth: 11-04-1955  Transition of Care [facility]) CM/SW Contact:    Elease Hashimoto, LCSW Phone Number: 06/22/2019, 2:07 PM  Clinical Narrative:  Could not reach pt via telephone due and in a [facility] room. Called wife_pam who was just released from the hospital yesterday due to [place]. She reports she and pt, son and granddaughter went to the beach with her sister and brother in-law and they all got COVID. She is home on O2 now but is doing well. She has insurance, but pt does not and will not be eligible to go to a rehab due to this. Wife is aware of this. She reports he does have a PCP burt missed his last appointment. She voiced he is stubborn and she really can;t get through to him. "Stupid is as stupid does." He and wife are retired and are independent prior to this and both drive. She voiced they will take pt home and do whatever is needed. She wanted to know what was going on with him. Will secure chat MD to ask to call wife to answer her questions. Will continue to follow and address discharge needs and assess pt when appropriate.                  Expected Discharge Plan: [place] Barriers to Discharge: Continued Medical Work up   Patient Goals and CMS Choice Patient states their goals for this hospitalization and ongoing recovery are:: Wife voiced they all got COVID and now he is here. She was discharged yesterday from the hospital      Expected Discharge Plan and Services Expected Discharge Plan: [place] In-house Referral: Clinical Social Work   Post Acute Care Choice: [place] arrangements for the past 2 months: [place]                                      Prior Living Arrangements/Services Living arrangements for the past 2 months: Single Family Home Lives with:: Spouse, Adult Children, Other  (Comment)(granddaughter) Patient language and need for interpreter reviewed:: No Do you feel safe going back to the place where you live?: Yes      Need for Family Participation in Patient Care: Yes (Comment) Care giver support system in place?: Yes (comment)   Criminal Activity/Legal Involvement Pertinent to Current Situation/Hospitalization: No - Comment as needed  Activities of Daily Living Home Assistive Devices/Equipment: None ADL Screening (condition at time of admission) Patient's cognitive ability adequate to safely complete daily activities?: Yes Is the patient deaf or have difficulty hearing?: Yes Does the patient have difficulty seeing, even when wearing glasses/contacts?: No Does the patient have difficulty concentrating, remembering, or making decisions?: No Patient able to express need for assistance with ADLs?: Yes Does the patient have difficulty dressing or bathing?: No Independently performs ADLs?: Yes (appropriate for developmental age) Does the patient have difficulty walking or climbing stairs?: No Weakness of Legs: None Weakness of Arms/Hands: Left  Permission Sought/Granted Permission sought to share information with : Family Supports Permission granted to share information with : Yes, Verbal Permission Granted  Share Information with NAME: [name]     Permission granted to share info w Relationship: Wife     Emotional Assessment Appearance:: Appears stated age Attitude/Demeanor/Rapport: Unable  to Assess(out of room for test) Affect (typically observed): Unable to Assess Orientation: : Oriented to Self, Oriented to Place Alcohol / Substance Use: Tobacco Use(remote) Psych Involvement: No (comment)  Admission diagnosis:  Stroke [facility]) [I63.9] Acute respiratory failure with hypoxia ([place]) [J96.01] Community acquired pneumonia of right upper lobe of lung [J18.9] Ischemic cerebrovascular accident (CVA) [facility]) [I63.9] Patient Active Problem List   Diagnosis Date  Noted  . Generalized weakness 06/22/2019  . Hypertensive urgency 06/22/2019  . Stroke ([place]) 06/22/2019  . Acute respiratory failure with hypoxia ([place]) 06/22/2019  . Diabetes mellitus without complication ([place]) 16/12/9602  . Chronic diastolic CHF (congestive heart failure) ([place]) 06/22/2019  . Pneumonia due to COVID-19 virus 06/22/2019  . Left arm weakness 06/22/2019  . Pulmonary nodules 07/12/2016  . H/O necrotizing fasciitis 05/18/2016  . Oral pain of unknown etiology 05/18/2016  . H/O osteomyelitis 05/18/2016  . Sepsis ([place]) 01/08/2016  . Advance care planning 01/07/2016  . Tachycardia 01/07/2016  . Dry gangrene ([place]) 01/04/2016  . Hypokalemia 12/21/2015  . Protein-calorie malnutrition, severe 12/11/2015  . Electrolyte imbalance 12/11/2015  . Intractable nausea and vomiting 12/10/2015  . Dehydration 11/16/2015  . Odynophagia 11/16/2015  . Anemia 11/16/2015  . Thrombocytopenia ([place]) 11/16/2015  . Hyponatremia 11/12/2015  . Hypomagnesemia 11/12/2015  . Cancer associated pain 10/22/2015  . Carcinoma of base of tongue ([place]) 09/10/2015  . Low back pain 04/06/2015  . Hypertension 12/05/2014  . Hypertensive CKD (chronic kidney disease) 12/05/2014  . Hyperlipidemia 12/05/2014  . Class II obesity 12/05/2014  . Erectile dysfunction 12/05/2014  . Nonalcoholic steatohepatitis 54/11/8117   PCP:  System, Pcp Not In Pharmacy:   [place], [place] [address] Phone: [phone] Fax: [phone]     Social Determinants of Health (SDOH) Interventions    Readmission Risk Interventions No flowsheet data found.

## 2019-06-22 NOTE — Progress Notes (Signed)
PT Cancellation Note  Patient Details Name: Kent [name] MRN: [id number] DOB: Mar 05, 1955   Cancelled Treatment:    Reason Eval/Treat Not Completed: Patient not medically ready(Chart reviewed, OT consulted. OT reports BP is drastically elevated, mentation quite limited this date, pt not following cues, difficulty participating. Will hold evaluation at this time, attempt again at later date/time as appropriate.)  3:10 PM, 06/22/19 Etta Grandchild, PT, DPT Physical Therapist - [facility]  [phone] ([place])    [place] C 06/22/2019, 3:10 PM

## 2019-06-22 NOTE — ED Notes (Signed)
Pt transported otf for MRI

## 2019-06-22 NOTE — Progress Notes (Signed)
Remdesivir - Pharmacy Brief Note   O:  ALT: N/A CXR: Patchy peripheral airspace disease in the right upper lobe compatible with pneumonia. SpO2: 98% on RA   A/P:  Remdesivir 200 mg IVPB once followed by 100 mg IVPB daily x 4 days.   Pearla Dubonnet, PharmD Clinical Pharmacist 06/22/2019 8:11 AM

## 2019-06-22 NOTE — ED Triage Notes (Signed)
Pt from home to ED via [facility] for left extremity weakness. Pt st he was asleep and when he woke up his left arm was withdrawn and "felt numb"/ Pt also st he is having intermittent weakness in his legs bilaterally as well. Pt reports now being incontinent of bladder and bowels.

## 2019-06-22 NOTE — ED Notes (Signed)
Pt transported otf for CT 

## 2019-06-22 NOTE — Consult Note (Signed)
Requesting Physician: Blaine Hamper    Chief Complaint: LUE weakness, fatigue  I have been asked by Dr. Blaine Hamper to see this patient in consultation for stroke.  HPI: Kent [name] is an 64 y.o. 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  Patient left.  Reports going to bed at about 1230a and awakened about 30 minutes later noting LUE weakness.  Patient returned to the ED for further evaluation.  Initial NIHSS of 4.  Date last known well: 06/22/2019 Time last known well: Time: 01:00 tPA Given: No: outside time window  Past Medical History:  Diagnosis Date  . Arthritis   . Diabetes mellitus without complication ([place])   . Hyperlipidemia   . Hypertension   . Obesity   . Osteomyelitis ([place])    From pt chart  . Renal insufficiency    CKD noted on chart  . Staph infection 05/29/2016   Nov 2017. Staph infection in LT shoulder requiring debridement and grafts  . Status post debridement   . Stomach cancer ([place])    had tumor removed- GIST  . 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 EXCISION OF TONGUE LESION Left 06/19/2016   Procedure: EXCISION OF TONGUE LESION;  Surgeon: Margaretha Sheffield, MD;  Location: [facility];  Service: ENT;  Laterality: Left;  diabetic - insulin and oral meds - not using either currently  . gastric tumor removed  2010  . I & D EXTREMITY Left 01/09/2016   Procedure: IRRIGATION AND DEBRIDEMENT EXTREMITY;  Surgeon: Robert Bellow, MD;  Location: [facility];  Service: General;  Laterality: Left;  . LARYNGOSCOPY N/A 06/19/2016   Procedure: LARYNGOSCOPY;  Surgeon: Margaretha Sheffield,  MD;  Location: [facility];  Service: ENT;  Laterality: N/A;  . PERIPHERAL VASCULAR CATHETERIZATION Right 12/31/2015   Procedure: Lower Extremity Angiography;  Surgeon: Algernon Huxley, MD;  Location: [facility];  Service: Cardiovascular;  Laterality: Right;  . tongue mass biopsy      Family History  Problem Relation Age of Onset  . Heart disease Mother   . Hypertension Father   . Cancer Sister        stomach  . Heart disease Brother        MI  . Cancer Daughter   . Cancer Son        testicular  . ADD / ADHD Sister    Social History:  reports that he has never smoked. He has never used smokeless tobacco. He reports that he does not drink alcohol or use drugs.  Allergies: No Known Allergies  Medications:  I have reviewed the patient's current medications. Prior to Admission:  Medications Prior to Admission  Medication Sig Dispense Refill Last Dose  . losartan (COZAAR) 50 MG tablet Take 50 mg by mouth daily.   7+ days at Unknown  . metFORMIN (GLUCOPHAGE-XR) 500 MG 24 hr tablet Take 500 mg by mouth 2 (two) times daily with a meal.      Scheduled: .  stroke: mapping our early stages of recovery book   Does not apply Once  . vitamin C  500 mg Oral  Daily  . [START ON 06/23/2019] aspirin EC  81 mg Oral Daily  . atorvastatin  40 mg Oral Daily  . enoxaparin (LOVENOX) injection  40 mg Subcutaneous Q24H  . insulin aspart  0-5 Units Subcutaneous QHS  . insulin aspart  0-9 Units Subcutaneous TID WC  . ipratropium  2 puff Inhalation Q4H  . methylPREDNISolone (SOLU-MEDROL) injection  40 mg Intravenous Q12H  . zinc sulfate  220 mg Oral Daily    ROS: History obtained from the patient  General ROS: fatigue Psychological ROS: negative for - behavioral disorder, hallucinations, memory difficulties, mood swings or suicidal ideation Ophthalmic ROS: negative for - blurry vision, double vision, eye pain or loss of vision ENT ROS: negative for - epistaxis, nasal discharge, oral  lesions, sore throat, tinnitus or vertigo Allergy and Immunology ROS: negative for - hives or itchy/watery eyes Hematological and Lymphatic ROS: negative for - bleeding problems, bruising or swollen lymph nodes Endocrine ROS: negative for - galactorrhea, hair pattern changes, polydipsia/polyuria or temperature intolerance Respiratory ROS: negative for - cough, hemoptysis, shortness of breath or wheezing Cardiovascular ROS: negative for - chest pain, dyspnea on exertion, edema or irregular heartbeat Gastrointestinal ROS: negative for - abdominal pain, diarrhea, hematemesis, nausea/vomiting or stool incontinence Genito-Urinary ROS: negative for - dysuria, hematuria, incontinence or urinary frequency/urgency Musculoskeletal ROS: generalized weakness Neurological ROS: as noted in HPI Dermatological ROS: negative for rash and skin lesion changes  Physical Examination: Blood pressure (!) 169/128, pulse (!) 105, temperature (!) 97.5 F (36.4 C), temperature source Axillary, resp. rate 17, height 5\' 7"  (1.702 m), weight 81.6 kg, SpO2 100 %.  HEENT-  Normocephalic, no lesions, without obvious abnormality.  Normal external eye and conjunctiva.  Normal TM's bilaterally.  Normal auditory canals and external ears. Normal external nose, mucus membranes and septum.  Normal pharynx. Cardiovascular- S1, S2 normal, pulses palpable throughout   Lungs- chest clear, no wheezing, rales, normal symmetric air entry Abdomen- soft, non-tender; bowel sounds normal; no masses,  no organomegaly Extremities- no edema Lymph-no adenopathy palpable Musculoskeletal-no joint tenderness, deformity or swelling Skin-warm and dry, no hyperpigmentation, vitiligo, or suspicious lesions  Neurological Examination   Mental Status: Alert, oriented, thought content appropriate.  Speech fluent without evidence of aphasia.  Able to follow 3 step commands without difficulty. Cranial Nerves: II: Visual fields grossly normal, pupils  equal, round, reactive to light and accommodation III,IV, VI: ptosis not present, extra-ocular motions intact bilaterally V,VII: smile symmetric, facial light touch sensation normal bilaterally VIII: hearing normal bilaterally IX,X: gag reflex present XI: bilateral shoulder shrug XII: midline tongue extension Motor: Right : Upper extremity   5/5    Left:     Upper extremity   4-/5  Lower extremity   5/5     Lower extremity   5/5 Tone and bulk:normal tone throughout; no atrophy noted Sensory: Pinprick and light touch intact throughout, bilaterally Deep Tendon Reflexes: Symmetric throughout Plantars: Right: mute   Left: mute Cerebellar: Dysmetria with LUE finger to nose testing otherwise normal finger-to-nose and heel-to-shin testing Gait: not tested due to safety concerns   Laboratory Studies:  Basic Metabolic Panel: Recent Labs  Lab 06/21/19 2203 06/22/19 0600  NA 134* 136  K 3.7 3.2*  CL 100 98  CO2 26 28  GLUCOSE 153* 178*  BUN 6* 5*  CREATININE 0.94 0.85  CALCIUM 8.7* 8.6*    Liver Function Tests: Recent Labs  Lab 06/22/19 0722  AST 27  ALT 20  ALKPHOS 87  BILITOT 1.0  PROT  7.2  ALBUMIN 3.9   No results for input(s): LIPASE, AMYLASE in the last 168 hours. No results for input(s): AMMONIA in the last 168 hours.  CBC: Recent Labs  Lab 06/21/19 2203  WBC 6.8  HGB 13.8  HCT 38.9*  MCV 81.9  PLT 303    Cardiac Enzymes: No results for input(s): CKTOTAL, CKMB, CKMBINDEX, TROPONINI in the last 168 hours.  BNP: Invalid input(s): POCBNP  CBG: Recent Labs  Lab 06/21/19 2205 06/22/19 0817 06/22/19 1123  GLUCAP 141* 147* 197*    Microbiology: Results for orders placed or performed during the hospital encounter of 06/22/19  Respiratory Panel by RT PCR (Flu A&B, Covid) - Urine, Clean Catch     Status: Abnormal   Collection Time: 06/22/19  5:10 AM   Specimen: Urine, Clean Catch  Result Value Ref Range Status   SARS Coronavirus 2 by RT PCR POSITIVE  (A) NEGATIVE Final    Comment: RESULT CALLED TO, READ BACK BY AND VERIFIED WITH: GREG MOYER AT 1062 06/22/19 SDR (NOTE) SARS-CoV-2 target nucleic acids are DETECTED. SARS-CoV-2 RNA is generally detectable in upper respiratory specimens  during the acute phase of infection. Positive results are indicative of the presence of the identified virus, but do not rule out bacterial infection or co-infection with other pathogens not detected by the test. Clinical correlation with patient history and other diagnostic information is necessary to determine patient infection status. The expected result is Negative. Fact Sheet for Patients:  PinkCheek.be Fact Sheet for Healthcare Providers: GravelBags.it This test is not yet approved or cleared by the [place] FDA and  has been authorized for detection and/or diagnosis of SARS-CoV-2 by FDA under an Emergency Use Authorization (EUA).  This EUA will remain in effect (meaning this test can be used) for th e duration of  the COVID-19 declaration under Section 564(b)(1) of the Act, 21 U.S.C. section 360bbb-3(b)(1), unless the authorization is terminated or revoked sooner.    Influenza A by PCR NEGATIVE NEGATIVE Final   Influenza B by PCR NEGATIVE NEGATIVE Final    Comment: (NOTE) The Xpert Xpress SARS-CoV-2/FLU/RSV assay is intended as an aid in  the diagnosis of influenza from Nasopharyngeal swab specimens and  should not be used as a sole basis for treatment. Nasal washings and  aspirates are unacceptable for Xpert Xpress SARS-CoV-2/FLU/RSV  testing. Fact Sheet for Patients: PinkCheek.be Fact Sheet for Healthcare Providers: GravelBags.it This test is not yet approved or cleared by the [place] FDA and  has been authorized for detection and/or diagnosis of SARS-CoV-2 by  FDA under an Emergency Use Authorization (EUA). This  EUA will remain  in effect (meaning this test can be used) for the duration of the  Covid-19 declaration under Section 564(b)(1) of the Act, 21  U.S.C. section 360bbb-3(b)(1), unless the authorization is  terminated or revoked. Performed at [facility], [address] [place], [place] [address]     Coagulation Studies: Recent Labs    06/22/19 0600  LABPROT 12.6  INR 1.0    Urinalysis:  Recent Labs  Lab 06/21/19 2203  COLORURINE YELLOW*  LABSPEC 1.006  PHURINE 7.0  GLUCOSEU NEGATIVE  HGBUR NEGATIVE  BILIRUBINUR NEGATIVE  KETONESUR NEGATIVE  PROTEINUR 100*  NITRITE NEGATIVE  LEUKOCYTESUR NEGATIVE    Lipid Panel:    Component Value Date/Time   CHOL 151 12/05/2014 1418   TRIG 80 06/22/2019 0510   TRIG 213 (H) 12/05/2014 1418   VLDL 43 (H) 12/05/2014 1418    HgbA1C:  Lab Results  Component Value Date   HGBA1C 5.3 08/12/2016    Urine Drug Screen:      Component Value Date/Time   LABOPIA NONE DETECTED 06/22/2019 0510   COCAINSCRNUR NONE DETECTED 06/22/2019 0510   LABBENZ NONE DETECTED 06/22/2019 0510   AMPHETMU NONE DETECTED 06/22/2019 0510   THCU NONE DETECTED 06/22/2019 0510   LABBARB NONE DETECTED 06/22/2019 0510    Alcohol Level:  Recent Labs  Lab 06/22/19 0510  ETH <10    Other results: EKG: normal sinus rhythm at 97 bpm.  Imaging: CT ANGIO HEAD W OR WO CONTRAST  Addendum Date: 06/22/2019   ADDENDUM REPORT: 06/22/2019 10:54 ADDENDUM: These results were called by telephone at the time of interpretation on 06/22/2019 at 10:54 am to provider Britta Louth , who verbally acknowledged these results. Electronically Signed   By: Kellie Simmering DO   On: 06/22/2019 10:54   Result Date: 06/22/2019 CLINICAL DATA:  Stroke, follow-up. EXAM: CT ANGIOGRAPHY HEAD AND NECK TECHNIQUE: Multidetector CT imaging of the head and neck was performed using the standard protocol during bolus administration of intravenous contrast. Multiplanar CT image  reconstructions and MIPs were obtained to evaluate the vascular anatomy. Carotid stenosis measurements (when applicable) are obtained utilizing NASCET criteria, using the distal internal carotid diameter as the denominator. CONTRAST:  45mL OMNIPAQUE IOHEXOL 350 MG/ML SOLN COMPARISON:  Brain MRI performed earlier the same day 06/22/2019, chest radiograph 06/22/2019, PET-CT 05/29/2016, neck CT 08/17/2015 FINDINGS: CT HEAD FINDINGS Brain: Acute/early subacute infarction changes within the right occipital lobe have not significantly changed in extent. Additional foci of acute/early subacute infarction within the medial and posterior right temporal lobe and right thalamus, some of which was better appreciated on same-day brain MRI. Subtle petechial hemorrhage within the right occipital lobe was also better appreciated on this prior exam. No interval infarct is identified. Redemonstrated chronic cortically based infarct within the posterior left frontal lobe. Stable background moderate chronic small vessel ischemic disease and mild generalized parenchymal atrophy. There is no evidence of intracranial mass. No midline shift or extra-axial fluid collection Vascular: Reported below. Skull: Normal. Negative for fracture or focal lesion. Sinuses: Mild ethmoid and maxillary sinus mucosal thickening. No significant mastoid effusion. Orbits: No acute abnormality. Review of the MIP images confirms the above findings CTA NECK FINDINGS Aortic arch: Common origin of the innominate and left common carotid arteries. The visualized aortic arch is unremarkable. No significant innominate or proximal subclavian artery stenosis. Right carotid system: CCA and ICA patent within the neck without significant stenosis. Mild mixed plaque within the carotid bifurcation Left carotid system: CCA and ICA patent within the neck without significant stenosis. Mild soft plaque within the CCA. Prominent tortuosity of the proximal to mid ICA. Vertebral  arteries: The vertebral arteries are patent within the neck bilaterally. Soft plaque results in moderate/severe stenosis at the origin of the left vertebral artery. Skeleton: No acute bony abnormality or aggressive osseous lesion. Other neck: Ill ill-defined soft tissue prominence with irregular enhancement and regions of internal hypodensity within the left neck, closely related to the left sternocleidomastoid muscle. This region is incompletely assessed on this arterial phase scan, but measures approximately 3.9 x 1.9 x 5.4 cm (AP x TV x CC) (for instance as seen on series 4, image 160 and series 5, image 161). Findings are highly suspicious for a residual/recurrent tumor deposit or nodal conglomerate. Surrounding soft tissue infiltration is nonspecific but may reflect treatment related change. Upper chest: There are multifocal ill-defined and nodular opacities  within the imaged lung apices which are likely infectious in etiology. Septic emboli cannot be excluded. Review of the MIP images confirms the above findings CTA HEAD FINDINGS Anterior circulation: The intracranial right internal carotid artery is patent without significant stenosis. The intracranial left internal carotid artery is patent. Mild mixed plaque within the paraclinoid segment with mild stenosis at this site. The M1 middle cerebral arteries are patent without significant stenosis. No M2 proximal branch occlusion is identified. Atherosclerotic irregularity of the M2 and more distal MCA branch vessels bilaterally. Most notably, there are foci of moderate/severe stenosis within a proximal M2 right MCA branch vessel (series 6, image 17). Hypoplastic A1 left ACA. The anterior cerebral arteries are patent without high-grade proximal stenosis. There is a bulbous appearance of the A-comm complex measuring 3.5 cm (series 4, image 246) (series 5, image 80). Posterior circulation: The intracranial vertebral arteries are patent bilaterally. Moderate/severe  focal atherosclerotic narrowing of the V4 segment on the right. Immediately adjacent to this stenosis, there is a 1-2 mm rounded focus of hyperdensity along the V4 segment (series 5, images 130 and 131). This may reflect a tiny aneurysm or eccentric focus of calcified plaque. The basilar artery is patent without significant stenosis. Suspected fetal origin right posterior cerebral artery. The right posterior communicating artery becomes occluded at its distal aspect (series 7, images 97 and 98). No definite distal reconstitution of the right posterior cerebral artery is identified. There is a fetal origin left posterior cerebral artery, which is patent. There is a high-grade focal stenosis within the left posterior communicating artery (series 7, image 121). Venous sinuses: Within limitations of contrast timing, no convincing thrombus. Anatomic variants: As described Review of the MIP images confirms the above findings IMPRESSION: CT head: 1. Acute/early subacute infarction changes within the right occipital and temporal lobes, as well as right thalamus, have not significantly changed in extent as compared to examinations performed earlier the same day. 2. No interval intracranial abnormality is identified. 3. Stable background moderate chronic small vessel ischemic disease and mild generalized parenchymal atrophy. CTA neck: 1. The bilateral common and internal carotid arteries are patent within the neck without significant stenosis. 2. The vertebral arteries are codominant and patent within the neck bilaterally. Moderate/severe atherosclerotic narrowing at the origin of the left vertebral artery. 3. Multifocal ill-defined and nodular opacities within the periphery of the imaged lung apices which are likely infectious in etiology. Septic emboli cannot be excluded. 4. Ill-defined soft tissue with irregular enhancement and regions of internal hypodensity within the left neck measuring 3.9 x 1.9 x 5.4 cm. Findings are  incompletely assessed on this arterial phase scan, but suspicious for a residual/recurrent tumor deposit or nodal conglomerate. Non-emergent contrast-enhanced neck CT is recommended for further evaluation. CTA head: 1. Probable fetal origin right posterior cerebral artery. The right posterior communicating artery becomes occluded at its distal aspect. No definite reconstitution of the right posterior cerebral artery is identified more distally. 2. Fetal origin left posterior cerebral artery with high-grade focal stenosis within the left posterior communicating artery. 3. Focal high-grade stenosis within the V4 right vertebral artery. 4. Atherosclerotic irregularity of the M2 and more distal MCA branch vessels bilaterally. Most notably there are multifocal moderate/severe stenoses within a proximal M2 right MCA branch. 5. Bulbous anterior communicating artery complex measuring up to 3.5 mm suspicious for aneurysm. Catheter-based angiography is recommended for further evaluation, as clinically warranted. 6. 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 This too  could be further assessed at time of catheter based angiography. Electronically Signed: By: Kellie Simmering DO On: 06/22/2019 10:46   CT Head Wo Contrast  Result Date: 06/22/2019 CLINICAL DATA:  Left extremity weakness. Left-sided numbness. EXAM: CT HEAD WITHOUT CONTRAST TECHNIQUE: Contiguous axial images were obtained from the base of the skull through the vertex without intravenous contrast. COMPARISON:  None. FINDINGS: Brain: Focal hypoattenuation and loss of gray-white differentiation is noted in the right occipital lobe. No hemorrhage or mass lesion is present. Moderate diffuse white matter disease is present bilaterally. The right insular cortex and basal ganglia are intact. Remote left posterior frontal lobe infarct is present. The ventricles are of normal size. No significant extraaxial fluid collection is  present. Vascular: Atherosclerotic calcifications are present within the cavernous internal carotid arteries bilaterally as well as in the right vertebral artery. No hyperdense vessel is present. Skull: Calvarium is intact. No focal lytic or blastic lesions are present. Sinuses/Orbits: Mild mucosal thickening present in maxillary sinuses and ethmoid air cells bilaterally. No fluid levels are present. The globes and orbits are within normal limits. IMPRESSION: 1. Focal hypoattenuation and loss of gray-white differentiation in the right occipital lobe is concerning for acute/subacute nonhemorrhagic infarct. 2. Remote left posterior frontal lobe infarct. 3. Moderate diffuse white matter disease. This likely reflects the sequela of chronic microvascular ischemia. Acute white matter ischemia is not excluded. These results were called by telephone at the time of interpretation on 06/22/2019 at 5:47 am to provider [facility] , who verbally acknowledged these results. Electronically Signed   By: San Morelle M.D.   On: 06/22/2019 05:47   CT ANGIO NECK W OR WO CONTRAST  Addendum Date: 06/22/2019   ADDENDUM REPORT: 06/22/2019 10:54 ADDENDUM: These results were called by telephone at the time of interpretation on 06/22/2019 at 10:54 am to provider Shakela Donati , who verbally acknowledged these results. Electronically Signed   By: Kellie Simmering DO   On: 06/22/2019 10:54   Result Date: 06/22/2019 CLINICAL DATA:  Stroke, follow-up. EXAM: CT ANGIOGRAPHY HEAD AND NECK TECHNIQUE: Multidetector CT imaging of the head and neck was performed using the standard protocol during bolus administration of intravenous contrast. Multiplanar CT image reconstructions and MIPs were obtained to evaluate the vascular anatomy. Carotid stenosis measurements (when applicable) are obtained utilizing NASCET criteria, using the distal internal carotid diameter as the denominator. CONTRAST:  36mL OMNIPAQUE IOHEXOL 350 MG/ML SOLN  COMPARISON:  Brain MRI performed earlier the same day 06/22/2019, chest radiograph 06/22/2019, PET-CT 05/29/2016, neck CT 08/17/2015 FINDINGS: CT HEAD FINDINGS Brain: Acute/early subacute infarction changes within the right occipital lobe have not significantly changed in extent. Additional foci of acute/early subacute infarction within the medial and posterior right temporal lobe and right thalamus, some of which was better appreciated on same-day brain MRI. Subtle petechial hemorrhage within the right occipital lobe was also better appreciated on this prior exam. No interval infarct is identified. Redemonstrated chronic cortically based infarct within the posterior left frontal lobe. Stable background moderate chronic small vessel ischemic disease and mild generalized parenchymal atrophy. There is no evidence of intracranial mass. No midline shift or extra-axial fluid collection Vascular: Reported below. Skull: Normal. Negative for fracture or focal lesion. Sinuses: Mild ethmoid and maxillary sinus mucosal thickening. No significant mastoid effusion. Orbits: No acute abnormality. Review of the MIP images confirms the above findings CTA NECK FINDINGS Aortic arch: Common origin of the innominate and left common carotid arteries. The visualized aortic arch is unremarkable. No significant innominate  or proximal subclavian artery stenosis. Right carotid system: CCA and ICA patent within the neck without significant stenosis. Mild mixed plaque within the carotid bifurcation Left carotid system: CCA and ICA patent within the neck without significant stenosis. Mild soft plaque within the CCA. Prominent tortuosity of the proximal to mid ICA. Vertebral arteries: The vertebral arteries are patent within the neck bilaterally. Soft plaque results in moderate/severe stenosis at the origin of the left vertebral artery. Skeleton: No acute bony abnormality or aggressive osseous lesion. Other neck: Ill ill-defined soft tissue  prominence with irregular enhancement and regions of internal hypodensity within the left neck, closely related to the left sternocleidomastoid muscle. This region is incompletely assessed on this arterial phase scan, but measures approximately 3.9 x 1.9 x 5.4 cm (AP x TV x CC) (for instance as seen on series 4, image 160 and series 5, image 161). Findings are highly suspicious for a residual/recurrent tumor deposit or nodal conglomerate. Surrounding soft tissue infiltration is nonspecific but may reflect treatment related change. Upper chest: There are multifocal ill-defined and nodular opacities within the imaged lung apices which are likely infectious in etiology. Septic emboli cannot be excluded. Review of the MIP images confirms the above findings CTA HEAD FINDINGS Anterior circulation: The intracranial right internal carotid artery is patent without significant stenosis. The intracranial left internal carotid artery is patent. Mild mixed plaque within the paraclinoid segment with mild stenosis at this site. The M1 middle cerebral arteries are patent without significant stenosis. No M2 proximal branch occlusion is identified. Atherosclerotic irregularity of the M2 and more distal MCA branch vessels bilaterally. Most notably, there are foci of moderate/severe stenosis within a proximal M2 right MCA branch vessel (series 6, image 17). Hypoplastic A1 left ACA. The anterior cerebral arteries are patent without high-grade proximal stenosis. There is a bulbous appearance of the A-comm complex measuring 3.5 cm (series 4, image 246) (series 5, image 80). Posterior circulation: The intracranial vertebral arteries are patent bilaterally. Moderate/severe focal atherosclerotic narrowing of the V4 segment on the right. Immediately adjacent to this stenosis, there is a 1-2 mm rounded focus of hyperdensity along the V4 segment (series 5, images 130 and 131). This may reflect a tiny aneurysm or eccentric focus of calcified  plaque. The basilar artery is patent without significant stenosis. Suspected fetal origin right posterior cerebral artery. The right posterior communicating artery becomes occluded at its distal aspect (series 7, images 97 and 98). No definite distal reconstitution of the right posterior cerebral artery is identified. There is a fetal origin left posterior cerebral artery, which is patent. There is a high-grade focal stenosis within the left posterior communicating artery (series 7, image 121). Venous sinuses: Within limitations of contrast timing, no convincing thrombus. Anatomic variants: As described Review of the MIP images confirms the above findings IMPRESSION: CT head: 1. Acute/early subacute infarction changes within the right occipital and temporal lobes, as well as right thalamus, have not significantly changed in extent as compared to examinations performed earlier the same day. 2. No interval intracranial abnormality is identified. 3. Stable background moderate chronic small vessel ischemic disease and mild generalized parenchymal atrophy. CTA neck: 1. The bilateral common and internal carotid arteries are patent within the neck without significant stenosis. 2. The vertebral arteries are codominant and patent within the neck bilaterally. Moderate/severe atherosclerotic narrowing at the origin of the left vertebral artery. 3. Multifocal ill-defined and nodular opacities within the periphery of the imaged lung apices which are likely infectious in etiology. Septic emboli  cannot be excluded. 4. Ill-defined soft tissue with irregular enhancement and regions of internal hypodensity within the left neck measuring 3.9 x 1.9 x 5.4 cm. Findings are incompletely assessed on this arterial phase scan, but suspicious for a residual/recurrent tumor deposit or nodal conglomerate. Non-emergent contrast-enhanced neck CT is recommended for further evaluation. CTA head: 1. Probable fetal origin right posterior cerebral  artery. The right posterior communicating artery becomes occluded at its distal aspect. No definite reconstitution of the right posterior cerebral artery is identified more distally. 2. Fetal origin left posterior cerebral artery with high-grade focal stenosis within the left posterior communicating artery. 3. Focal high-grade stenosis within the V4 right vertebral artery. 4. Atherosclerotic irregularity of the M2 and more distal MCA branch vessels bilaterally. Most notably there are multifocal moderate/severe stenoses within a proximal M2 right MCA branch. 5. Bulbous anterior communicating artery complex measuring up to 3.5 mm suspicious for aneurysm. Catheter-based angiography is recommended for further evaluation, as clinically warranted. 6. 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 This too could be further assessed at time of catheter based angiography. Electronically Signed: By: Kellie Simmering DO On: 06/22/2019 10:46   MR BRAIN WO CONTRAST  Result Date: 06/22/2019 CLINICAL DATA:  Focal neuro deficit, greater than 6 hours, stroke suspected. Additional history provided: Patient presents to emergency department from home with dizziness and weakness for the past 3-4 days. EXAM: MRI HEAD WITHOUT CONTRAST TECHNIQUE: Multiplanar, multiecho pulse sequences of the brain and surrounding structures were obtained without intravenous contrast. COMPARISON:  Noncontrast head CT performed earlier the same day 06/22/2019. FINDINGS: Brain: The examination is intermittently motion degraded. Most notably there is severe motion degradation of the sagittal T1 weighted sequence, moderate motion degradation of the axial T2 weighted sequence and moderate motion degradation of the axial T2/FLAIR sequence. There is patchy cortical/subcortical restricted diffusion within the right PCA territory involving the right occipital lobe and to a lesser degree the medial and posterior right temporal  lobe. Findings are consistent with acute/early subacute infarction. The largest region of infarction within the right occipital lobe measures 3.6 cm. Additional acute/early subacute infarcts within the right thalamus. Corresponding T2/FLAIR hyperintensity at these sites. Additionally, there is a small amount of gyriform T1 hyperintensity within the right occipital lobe consistent likely reflecting petechial hemorrhage. Redemonstrated small remote cortically based infarct within the posterior left frontal lobe. Moderate patchy T2/FLAIR hyperintensity within the cerebral white matter is nonspecific, but consistent with chronic small vessel ischemic disease. Mild generalized parenchymal atrophy. There is no evidence of intracranial mass. No midline shift or extra-axial fluid collection. No chronic intracranial blood products. Vascular: No definite loss of expected flow voids within the proximal large arterial vessels. Skull and upper cervical spine: No focal marrow lesion is identified within the limitations motion degraded imaging. Sinuses/Orbits: Visualized orbits demonstrate no acute abnormality. Paranasal sinus mucosal thickening greatest within the bilateral ethmoid air cells. No significant mastoid effusion IMPRESSION: 1. Motion degraded examination as described. 2. Multifocal changes of acute/early subacute infarction within the right PCA territory involving the right occipital and temporal lobes. Small amount of associated petechial hemorrhage within the right occipital lobe. Acute/early subacute infarcts are also present within the right thalamus. 3. 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 4. Moderate chronic small vessel ischemic disease. 5. Mild generalized parenchymal atrophy. 6. Paranasal sinus mucosal thickening. Electronically Signed   By: Kellie Simmering DO   On: 06/22/2019 08:09   DG Chest  Portable 1 View  Result Date: 06/22/2019 CLINICAL DATA:  Hypoxia. COVID  positive. EXAM: PORTABLE CHEST 1 VIEW COMPARISON:  One-view chest x-ray 06/19/2019 FINDINGS: Heart size is normal. Patchy peripheral airspace disease is present in the right upper lobe. No significant consolidation is present. No effusions are present. Bony thorax is within normal limits. IMPRESSION: Patchy peripheral airspace disease in the right upper lobe compatible with pneumonia. Electronically Signed   By: San Morelle M.D.   On: 06/22/2019 06:14    Assessment: 64 y.o. 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  Patient left.  Reports going to bed at about 1230a and awakened about 30 minutes later noting LUE weakness.  At presentation was outside time window for tPA.  MRI of the brain personally reviewed and shows acute/subacute infarcts in the right occipital, right temporal and right thalamic regions.  Some associated petechial hemorrhage noted.  Follow up CTA personally reviewed as well and shows multiple areas of stenosis/occlusion including left vertebral origin moderate to severe, right PCA occlusion, left PCA severe, proximal right M2 moderate to severe.  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  Lastly, there was some question of soft tissue mass in the neck.  Patient not considered an intervention candidate due to improvement in symptoms to NIHSS of 2.  Etiology likely secondary to large vessel to small vessel embolus.  Echocardiogram pending.  A1c pending, LDL pending.  Stroke Risk Factors - diabetes mellitus, hyperlipidemia and hypertension  Plan: 1. HgbA1c, fasting lipid panel pending.  Target A1c<7.0, target LDL<70 2. PT consult, OT consult, Speech consult 3. Echocardiogram with  bubble study pending 4. Prophylactic therapy-Antiplatelet med: Aspirin - dose ASA 81mg   5. NPO until RN stroke swallow screen 6. Telemetry monitoring 7. Frequent neuro checks 8. Neurosurgery evaluation for aneurysms 9. Further follow up of soft tissue mass in neck to rule out recurrence of cancer.   10.  Would not be aggressive with BP control at this time with intervention only if SBP>200.   Alexis Goodell, MD Neurology [phone] 06/22/2019, 1:02 PM

## 2019-06-22 NOTE — H&P (Signed)
History and Physical    Kent [name] CLE:[id number] DOB: [birth year] DOA: 06/22/2019  Referring MD/NP/PA:   PCP: System, Pcp Not In   Patient coming from:  The patient is coming from home.  At baseline, pt is independent for most of ADL.        Chief Complaint: weakness in extremities  HPI: Kent [name] is a 64 y.o. 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  Pt had positive Covid19 test 4 days ago. He denies cough or shortness breath, but per RN in ED, pt has mild dry cough. No fever or chills. He states that he has been feeling weak and fatigue since then. He states that he was asleep and when he woke up he feel weaker in left arm. He also states that he is having intermittent weakness in his legs bilaterally. He has dizziness when he stands up and walks. Per report, pt had possible slurry speech.  No vision loss or hearing loss. Pt reports incontinence of bladder and bowels.  No nausea, vomiting, or abdominal pain.  His son states that he had mild loose stool bowel movement at home.  Patient had oxygen desaturation to 86% on room air initially, currently 96% on room air.  Blood pressure 201/141, which improved to 123/93. Cardene gttt is ordered, but nopt started per RN in ED. HR 39 -108, RR21.  Chest x-ray showed right upper lobe patchy infiltration.  Patient is admitted to [facility] as inpatient.  # CT of the head showed: 1. Focal hypoattenuation and loss of gray-white differentiation in the right occipital lobe is concerning for acute/subacute nonhemorrhagic infarct. 2. Remote left posterior frontal lobe infarct. 3. Moderate diffuse white matter disease. This likely reflects the sequela of chronic microvascular ischemia. Acute white matter  ischemia is not excluded.  # MRI-brain: 1. Motion degraded examination as described. 2. Multifocal changes of acute/early subacute infarction within the right PCA territory involving the right occipital and temporal lobes. Small amount of associated petechial hemorrhage within the right occipital lobe. Acute/early subacute infarcts are also present within the right thalamus. 3. 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 4. Moderate chronic small vessel ischemic disease. 5. Mild generalized parenchymal atrophy. 6. Paranasal sinus mucosal thickening.   ED Course: pt was found to have WBC 6.8, positive COVID-19 PCR, alcohol level less than 10, INR 1.0, troponin VI, UDS negative, urinalysis negative, potassium 3.2, renal function okay, temperature 99.2,   Review of Systems:   General: no fevers, chills, no body weight gain, has poor appetite, has fatigue HEENT: no blurry vision, hearing changes or sore throat Respiratory: no dyspnea, coughing, wheezing CV: no chest pain, no palpitations GI: no nausea, vomiting, abdominal pain, diarrhea, constipation GU: no dysuria, burning on urination, increased urinary frequency, hematuria  Ext: no leg edema Neuro: has left arm weakness, no vision change or hearing loss Skin: no rash, no skin tear. MSK: No muscle spasm, no deformity, no limitation of range of movement in spin Heme: No easy bruising.  Travel history: No recent long distant travel.  Allergy: No Known Allergies  Past Medical History:  Diagnosis Date   Arthritis    Diabetes mellitus without complication ([place])    Hyperlipidemia    Hypertension    Obesity  Osteomyelitis ([place])    From pt chart   Renal insufficiency    CKD noted on chart   Staph infection 05/29/2016   Nov 2017. 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    Past Surgical History:  Procedure Laterality Date   EXCISION OF TONGUE LESION Left 06/19/2016   Procedure: EXCISION OF TONGUE LESION;  Surgeon: Margaretha Sheffield, MD;  Location: [facility];  Service: ENT;  Laterality: Left;  diabetic - insulin and oral meds - not using either currently   gastric tumor removed  2010   I & D EXTREMITY Left 01/09/2016   Procedure: IRRIGATION AND DEBRIDEMENT EXTREMITY;  Surgeon: Robert Bellow, MD;  Location: [facility];  Service: General;  Laterality: Left;   LARYNGOSCOPY N/A 06/19/2016   Procedure: LARYNGOSCOPY;  Surgeon: Margaretha Sheffield, MD;  Location: [facility];  Service: ENT;  Laterality: N/A;   PERIPHERAL VASCULAR CATHETERIZATION Right 12/31/2015   Procedure: Lower Extremity Angiography;  Surgeon: Algernon Huxley, MD;  Location: [facility];  Service: Cardiovascular;  Laterality: Right;   tongue mass biopsy      Social History:  reports that he has never smoked. He has never used smokeless tobacco. He reports that he does not drink alcohol or use drugs.  Family History:  Family History  Problem Relation Age of Onset   Heart disease Mother    Hypertension Father    Cancer Sister        stomach   Heart disease Brother        MI   Cancer Daughter    Cancer Son        testicular   ADD / ADHD Sister      Prior to Admission medications   Medication Sig Start Date End Date Taking? Authorizing Provider  amoxicillin (AMOXIL) 500 MG capsule Take 1 capsule (500 mg total) by mouth 3 (three) times daily. 07/28/17   Sable Feil, PA-C  aspirin EC 81 MG tablet Take 81 mg by mouth daily as needed for moderate pain.    [provider]  bacitracin 500 UNIT/GM ointment Apply topically. 01/22/16   [provider]  cetirizine (ZYRTEC) 10 MG tablet Take 1 tablet (10 mg total) by mouth daily. Patient not taking: Reported on  05/09/2016 02/11/16   Volney American, PA-C  fexofenadine-pseudoephedrine (ALLEGRA-D) 60-120 MG 12 hr tablet Take 1 tablet by mouth 2 (two) times daily. 07/28/17   Sable Feil, PA-C  fluticasone (FLONASE) 50 MCG/ACT nasal spray Place 2 sprays into both nostrils daily. Patient not taking: Reported on 05/09/2016 02/11/16   Volney American, PA-C  HYDROcodone-acetaminophen (NORCO/VICODIN) 5-325 MG tablet Take 1 tablet by mouth every 4 (four) hours as needed.  06/19/16   [provider]  lisinopril (PRINIVIL,ZESTRIL) 20 MG tablet Take 1 tablet (20 mg total) by mouth daily. 08/07/16   Kathrine Haddock, NP  magic mouthwash SOLN Take 5 mLs by mouth 4 (four) times daily as needed for mouth pain. Patient not taking: Reported on 08/12/2016 12/03/15   Lequita Asal, MD  magic mouthwash w/lidocaine SOLN Take 5 mLs by mouth 4 (four) times daily. 07/28/17   Sable Feil, PA-C  metFORMIN (GLUCOPHAGE) 1000 MG tablet Take 1,000 mg by mouth daily.  07/17/16   [provider]  potassium chloride SA (K-DUR,KLOR-CON) 20 MEQ tablet 20 meq (1 pill) by mouth twice today then begin 1 pill a day beginning 05/20/2016 05/19/16   Lequita Asal, MD    Physical Exam: Vitals:   06/22/19 0645 06/22/19 0700 06/22/19 0830 06/22/19 0845  BP: (!) 123/93 (!) 128/97 (!) 175/130 132/82  Pulse: (!) 39 73 (!) 104   Resp: (!) 21 (!) 0 (!) 25 (!) 8  Temp:      TempSrc:      SpO2: 98% 94% 100%   Weight:      Height:       General: Not in acute distress HEENT:       Eyes: PERRL, EOMI, no scleral icterus.       ENT: No discharge from the ears and nose, no pharynx injection, no tonsillar enlargement.        Neck: No JVD, no bruit, no mass felt. Heme: No neck lymph node enlargement. Cardiac: S1/S2, RRR, No murmurs, No gallops or rubs. Respiratory: No rales, wheezing, rhonchi or rubs. GI: Soft, nondistended, nontender, no rebound pain, no organomegaly, BS present. GU: No hematuria Ext: No pitting  leg edema bilaterally. 2+DP/PT pulse bilaterally. Musculoskeletal: No joint deformities, No joint redness or warmth, no limitation of ROM in spin. Skin: No rashes.  Neuro: Alert, oriented X3, cranial nerves II-XII grossly intact. Muscle strength 3/5 in left arm and 4/5 in all other extremities, sensation to light touch intact. Brachial reflex 2+ bilaterally.  Psych: Patient is not psychotic, no suicidal or hemocidal ideation.  Labs on Admission: I have personally reviewed following labs and imaging studies  CBC: Recent Labs  Lab 06/21/19 2203  WBC 6.8  HGB 13.8  HCT 38.9*  MCV 81.9  PLT 096   Basic Metabolic Panel: Recent Labs  Lab 06/21/19 2203 06/22/19 0600  NA 134* 136  K 3.7 3.2*  CL 100 98  CO2 26 28  GLUCOSE 153* 178*  BUN 6* 5*  CREATININE 0.94 0.85  CALCIUM 8.7* 8.6*   GFR: Estimated Creatinine Clearance: 89.8 mL/min (by C-G formula based on SCr of 0.85 mg/dL). Liver Function Tests: Recent Labs  Lab 06/22/19 0722  AST 27  ALT 20  ALKPHOS 87  BILITOT 1.0  PROT 7.2  ALBUMIN 3.9   No results for input(s): LIPASE, AMYLASE in the last 168 hours. No results for input(s): AMMONIA in the last 168 hours. Coagulation Profile: Recent Labs  Lab 06/22/19 0600  INR 1.0   Cardiac Enzymes: No results for input(s): CKTOTAL, CKMB, CKMBINDEX, TROPONINI in the last 168 hours. BNP (last 3 results) No results for input(s): PROBNP in the last 8760 hours. HbA1C: No results for input(s): HGBA1C in the last 72 hours. CBG: Recent Labs  Lab 06/21/19 2205 06/22/19 0817  GLUCAP 141* 147*   Lipid Profile: Recent Labs    06/22/19 0510  TRIG 80   Thyroid Function Tests: No results for input(s): TSH, T4TOTAL, FREET4, T3FREE, THYROIDAB in the last 72 hours. Anemia Panel: No results for input(s): VITAMINB12, FOLATE, FERRITIN, TIBC, IRON, RETICCTPCT in the last 72 hours. Urine analysis:    Component Value Date/Time   COLORURINE YELLOW (A) 06/21/2019 2203    APPEARANCEUR CLEAR (A) 06/21/2019 2203   APPEARANCEUR Clear 02/14/2013 1441   LABSPEC 1.006 06/21/2019 2203   LABSPEC 1.020 02/14/2013 1441   PHURINE 7.0 06/21/2019 2203   GLUCOSEU NEGATIVE 06/21/2019 2203   GLUCOSEU 300 mg/dL 02/14/2013 1441   HGBUR NEGATIVE 06/21/2019  [place] 06/21/2019 2203   BILIRUBINUR Negative 02/14/2013 [place] 06/21/2019 2203   PROTEINUR 100 (A) 06/21/2019 2203   NITRITE NEGATIVE 06/21/2019 2203   LEUKOCYTESUR NEGATIVE 06/21/2019 2203   LEUKOCYTESUR Negative 02/14/2013 1441   Sepsis Labs: @LABRCNTIP (procalcitonin:4,lacticidven:4) ) Recent Results (from the past 240 hour(s))  Respiratory Panel by RT PCR (Flu A&B, Covid) - Urine, Clean Catch     Status: Abnormal   Collection Time: 06/22/19  5:10 AM   Specimen: Urine, Clean Catch  Result Value Ref Range Status   SARS Coronavirus 2 by RT PCR POSITIVE (A) NEGATIVE Final    Comment: RESULT CALLED TO, READ BACK BY AND VERIFIED WITH: GREG MOYER AT 0177 06/22/19 SDR (NOTE) SARS-CoV-2 target nucleic acids are DETECTED. SARS-CoV-2 RNA is generally detectable in upper respiratory specimens  during the acute phase of infection. Positive results are indicative of the presence of the identified virus, but do not rule out bacterial infection or co-infection with other pathogens not detected by the test. Clinical correlation with patient history and other diagnostic information is necessary to determine patient infection status. The expected result is Negative. Fact Sheet for Patients:  PinkCheek.be Fact Sheet for Healthcare Providers: GravelBags.it This test is not yet approved or cleared by the [place] FDA and  has been authorized for detection and/or diagnosis of SARS-CoV-2 by FDA under an Emergency Use Authorization (EUA).  This EUA will remain in effect (meaning this test can be used) for th e duration of  the  COVID-19 declaration under Section 564(b)(1) of the Act, 21 U.S.C. section 360bbb-3(b)(1), unless the authorization is terminated or revoked sooner.    Influenza A by PCR NEGATIVE NEGATIVE Final   Influenza B by PCR NEGATIVE NEGATIVE Final    Comment: (NOTE) The Xpert Xpress SARS-CoV-2/FLU/RSV assay is intended as an aid in  the diagnosis of influenza from Nasopharyngeal swab specimens and  should not be used as a sole basis for treatment. Nasal washings and  aspirates are unacceptable for Xpert Xpress SARS-CoV-2/FLU/RSV  testing. Fact Sheet for Patients: PinkCheek.be Fact Sheet for Healthcare Providers: GravelBags.it This test is not yet approved or cleared by the [place] FDA and  has been authorized for detection and/or diagnosis of SARS-CoV-2 by  FDA under an Emergency Use Authorization (EUA). This EUA will remain  in effect (meaning this test can be used) for the duration of the  Covid-19 declaration under Section 564(b)(1) of the Act, 21  U.S.C. section 360bbb-3(b)(1), unless the authorization is  terminated or revoked. Performed at [facility], [address] [place], [place] [address]      Radiological Exams on Admission: CT Head Wo Contrast  Result Date: 06/22/2019 CLINICAL DATA:  Left extremity weakness. Left-sided numbness. EXAM: CT HEAD WITHOUT CONTRAST TECHNIQUE: Contiguous axial images were obtained from the base of the skull through the vertex without intravenous contrast. COMPARISON:  None. FINDINGS: Brain: Focal hypoattenuation and loss of gray-white differentiation is noted in the right occipital lobe. No hemorrhage or mass lesion is present. Moderate diffuse white matter disease is present bilaterally. The right insular cortex and basal ganglia are intact. Remote left posterior frontal lobe infarct is present. The ventricles are of normal size. No significant extraaxial fluid collection is  present. Vascular: Atherosclerotic calcifications are present within the cavernous internal carotid arteries bilaterally as well as in the right vertebral artery. No hyperdense vessel is present. Skull: Calvarium is intact. No focal lytic or blastic lesions are present. Sinuses/Orbits:  Mild mucosal thickening present in maxillary sinuses and ethmoid air cells bilaterally. No fluid levels are present. The globes and orbits are within normal limits. IMPRESSION: 1. Focal hypoattenuation and loss of gray-white differentiation in the right occipital lobe is concerning for acute/subacute nonhemorrhagic infarct. 2. Remote left posterior frontal lobe infarct. 3. Moderate diffuse white matter disease. This likely reflects the sequela of chronic microvascular ischemia. Acute white matter ischemia is not excluded. These results were called by telephone at the time of interpretation on 06/22/2019 at 5:47 am to provider [facility] , who verbally acknowledged these results. Electronically Signed   By: San Morelle M.D.   On: 06/22/2019 05:47   MR BRAIN WO CONTRAST  Result Date: 06/22/2019 CLINICAL DATA:  Focal neuro deficit, greater than 6 hours, stroke suspected. Additional history provided: Patient presents to emergency department from home with dizziness and weakness for the past 3-4 days. EXAM: MRI HEAD WITHOUT CONTRAST TECHNIQUE: Multiplanar, multiecho pulse sequences of the brain and surrounding structures were obtained without intravenous contrast. COMPARISON:  Noncontrast head CT performed earlier the same day 06/22/2019. FINDINGS: Brain: The examination is intermittently motion degraded. Most notably there is severe motion degradation of the sagittal T1 weighted sequence, moderate motion degradation of the axial T2 weighted sequence and moderate motion degradation of the axial T2/FLAIR sequence. There is patchy cortical/subcortical restricted diffusion within the right PCA territory involving the right  occipital lobe and to a lesser degree the medial and posterior right temporal lobe. Findings are consistent with acute/early subacute infarction. The largest region of infarction within the right occipital lobe measures 3.6 cm. Additional acute/early subacute infarcts within the right thalamus. Corresponding T2/FLAIR hyperintensity at these sites. Additionally, there is a small amount of gyriform T1 hyperintensity within the right occipital lobe consistent likely reflecting petechial hemorrhage. Redemonstrated small remote cortically based infarct within the posterior left frontal lobe. Moderate patchy T2/FLAIR hyperintensity within the cerebral white matter is nonspecific, but consistent with chronic small vessel ischemic disease. Mild generalized parenchymal atrophy. There is no evidence of intracranial mass. No midline shift or extra-axial fluid collection. No chronic intracranial blood products. Vascular: No definite loss of expected flow voids within the proximal large arterial vessels. Skull and upper cervical spine: No focal marrow lesion is identified within the limitations motion degraded imaging. Sinuses/Orbits: Visualized orbits demonstrate no acute abnormality. Paranasal sinus mucosal thickening greatest within the bilateral ethmoid air cells. No significant mastoid effusion IMPRESSION: 1. Motion degraded examination as described. 2. Multifocal changes of acute/early subacute infarction within the right PCA territory involving the right occipital and temporal lobes. Small amount of associated petechial hemorrhage within the right occipital lobe. Acute/early subacute infarcts are also present within the right thalamus. 3. 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 4. Moderate chronic small vessel ischemic disease. 5. Mild generalized parenchymal atrophy. 6. Paranasal sinus mucosal thickening. Electronically Signed   By: Kellie Simmering DO   On: 06/22/2019 08:09   DG  Chest Portable 1 View  Result Date: 06/22/2019 CLINICAL DATA:  Hypoxia. COVID positive. EXAM: PORTABLE CHEST 1 VIEW COMPARISON:  One-view chest x-ray 06/19/2019 FINDINGS: Heart size is normal. Patchy peripheral airspace disease is present in the right upper lobe. No significant consolidation is present. No effusions are present. Bony thorax is within normal limits. IMPRESSION: Patchy peripheral airspace disease in the right upper lobe compatible with pneumonia. Electronically Signed   By: San Morelle M.D.   On: 06/22/2019 06:14     EKG: Independently reviewed.  Sinus rhythm, QTC 439, LAE, LAD, no ischemic change    Assessment/Plan Principal Problem:   Stroke [facility]) Active Problems:   Hypertension   Hyperlipidemia   Hypokalemia   Hypertensive urgency   Acute respiratory failure with hypoxia (HCC)   Diabetes mellitus without complication (HCC)   Chronic diastolic CHF (congestive heart failure) ([place])   Pneumonia due to COVID-19 virus   Stroke: MRI of brain showed multifocal changes of acute/early subacute infarction within the right PCA territory involving the right occipital and temporal lobes. Small amount of associated petechial hemorrhage within the right occipital lobe. Acute/early subacute infarcts are also present within the right thalamus.  Neurology, Dr. Doy Mince is consulted.  - Admit to MedSurg bed as inpatient - will follow up Neurology's Recs for other images -->CTA of head and neck - will hold oral Bp meds to allow permissive HTN in the setting of acute stroke  - continue home ASA (pt was given 324 mg of ASA already) - fasting lipid panel and HbA1c  - 2D transthoracic echocardiography  - swallowing screen. If fails, will get SLP - Check UDS  - PT/OT consult  Hypertension and Hypertensive urgency:  Blood pressure 201/141, which improved to 123/93. Cardene gttt is ordered, but not started in ED.  -will hold home Bp meds -will give saline hydralazine for SBP>220  or dCBP>120 to allow permissive hypertension  Hyperlipidemia: -will start Lipitor 40 mg daily  Hypokalemia: K= 3.2 on admission. - Repleted - Check Mg level  Acute respiratory failure with hypoxia due to [place] due to COVID-19 virus: initially pt had oxygen desaturation to 86% on room air.  Chest x-ray showed right upper lobe patchy infiltration. -Remdesivir per pharm -Solumedrol 40 mg bid -vitamin C, zinc.  -Bronchodilators -PRN Mucinex for cough -f/u Blood culture -D-dimer, BNP,Trop, LFT, CRP, LDH, Procalcitonin, Ferritin, fibinogen, TG, Hep B SAg, HIV ab -Daily CRP, Ferritin, D-dimer, -Will ask the patient to maintain an awake prone position for 16+ hours a day, if possible, with a minimum of 2-3 hours at a time -Will attempt to maintain euvolemia to a net negative fluid status -IF patient deteriorates, will consult PCCM and ID -f/u LFT  Diabetes mellitus without complication ([place]): Most recent A1c 6,5, controled. Patient is taking Metformin at home -SSI  Chronic diastolic CHF (congestive heart failure) ([place]): 2D echo on 01/09/2016 showed EF of 45-50%.  No leg edema or JVD.  CHF seems to be compensated. -Check BNP -->116.     DVT ppx: SQ Lovenox Code Status: partial code (OK for CPR, but no intubation) per his wife and son Family Communication:  Yes, patient's son and wife by phone Disposition Plan:  Anticipate discharge back to previous home environment Consults called:  Dr. Doy Mince of neurology Admission status: Med-surg bed as inpt     Status is: Inpatient Remains inpatient appropriate because: see below  Dispo: The patient is from: Home              Anticipated d/c is to: Home              Anticipated d/c date is: 2 days              Patient currently is not medically stable to d/c.   Inpatient status:  # Patient requires inpatient status due to high intensity of service, high risk for further deterioration and high frequency of surveillance required.  I  certify that at the point of admission it is my clinical judgment that the patient  will require inpatient hospital care spanning beyond 2 midnights from the point of admission.   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  The worrisome physical exam findings include left arm weakness  The initial radiographic and laboratory data are worrisome because of positive Covid 19 PCR, hypokalemia.  CT -head showed possible right occipital lobe stroke, which is conformed by MRI of brain.  Current medical needs: please see my assessment and plan  Predictability of an adverse outcome (risk): Patient has multiple comorbidities as listed above. Now presents with  acute respiratory failure with hypoxia due to peumonia due to COVID-19 virus and stroke. Patient's presentation is highly complicated.  Patient is at high risk of deteriorating.  Will need to be treated in hospital for at least 2 days.       Date of Service 06/22/2019    [facility]   If 7PM-7AM, please contact night-coverage [email or web address] 06/22/2019, 8:53 AM

## 2019-06-22 NOTE — Evaluation (Signed)
Occupational Therapy Evaluation Patient Details Name: Kent [name] MRN: [id number] DOB: Dec 10, 1955 Today's Date: 06/22/2019    History of Present Illness Per MD notes: Kent [name] is an 64 y.o. 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  Patient left the ED, but later returned via [facility] with left extremity weakness. MRI of the brain showed acute/subacute infarcts in the right occipital, right temporal and right thalamic regions.  Some associated petechial hemorrhage noted.  Follow up CTA shows multiple areas of stenosis/occlusion including left vertebral origin moderate to severe, right PCA occlusion, left PCA severe, proximal right M2 moderate to severe.  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   Clinical Impression   Kent [name] was seen for OT evaluation this date. Prior to hospital admission, pt reports he was generally independent with ADL/IADL management, however pt unable to provide full history/PLOF 2/2 impaired cognition during evaluation. Pt lives with his son and daughter (chart review indicates pt may also live with spouse) in a 1 level home. Currently pt demonstrates impairments as described below (See OT problem list) which functionally limit his ability to perform ADL/self-care tasks. Pt currently requires moderate assistance for UB/LB ADL management including bathing and dressing tasks. At time of OT evaluation pt BP assessed noted to be 193/115 after 3 minutes with pt in supine it was noted to  178/111. SpO2 remains 90% with pt on 2L [place]. RN notified and further functional assessment deferred for pt safety. Pt would benefit from skilled OT to address noted impairments and  functional limitations (see below for any additional details) in order to maximize safety and independence while minimizing falls risk and caregiver burden. Upon hospital discharge, recommend STR to maximize pt safety and return to PLOF.      Follow Up Recommendations  SNF    Equipment Recommendations  3 in 1 bedside commode    Recommendations for Other Services       Precautions / Restrictions Precautions Precautions: Fall Precaution Comments: High fall      Mobility Bed Mobility Overal bed mobility: Needs Assistance Bed Mobility: Supine to Sit;Sit to Sidelying     Supine to sit: Supervision;Min guard   Sit to sidelying: Min assist General bed mobility comments: Min a for mgt of BLE into bed during sit to sidelying t/f  Transfers                 General transfer comment: Mobility deferred for pt safety.    Balance Overall balance assessment: Needs assistance Sitting-balance support: Feet supported;Single extremity supported Sitting balance-Leahy Scale: Fair   Postural control: Left lateral lean                                 ADL either performed or assessed with clinical judgement   ADL Overall ADL's : Needs assistance/impaired                                       General ADL Comments: Pt functionally limited by impaired cogntion, decreased functional use of his LUE and cardiopulmonary status during evaluation. Is  noted to require prompting to initiate tasks including self-feeding and bed mobility. Becomes dizzy with attempts to set EOB, further functional mobility assessment deferred. Requires moderate asisstance for UB/LB bathing and dressing tasks. Set-up to min A for self-feeding and bed level grooming. Noted to be using urinal independently at bed level.     Vision   Additional Comments: Pt unable to state. Will continue to assess within functional context.     Perception     Praxis      Pertinent Vitals/Pain Pain  Assessment: No/denies pain     Hand Dominance Left   Extremity/Trunk Assessment Upper Extremity Assessment Upper Extremity Assessment: RUE deficits/detail;LUE deficits/detail RUE Deficits / Details: Generally WFLs LUE Deficits / Details: Pt reports feels numb, "like I can't use it any more". Unable to attain full AROM with shoulder flexion, significant pronator drift noted as compared to RUE. LUE Sensation: decreased light touch;decreased proprioception LUE Coordination: decreased fine motor;decreased gross motor   Lower Extremity Assessment Lower Extremity Assessment: Defer to PT evaluation;Overall WFL for tasks assessed       Communication Communication Communication: No difficulties   Cognition Arousal/Alertness: Lethargic Behavior During Therapy: WFL for tasks assessed/performed Overall Cognitive Status: No family/caregiver present to determine baseline cognitive functioning Area of Impairment: Problem solving;Following commands;Safety/judgement;Awareness                               General Comments: Pt oriented to self and place only. Is lethargic t/o evaluation and requires multimodal cueing to follow 1-step VCs. Pt becomes even more lethargic at end of session and lies back in bed with limited further verbal interaction with therapist. RN notified. No family/caregiver present to determine baseline.   General Comments  BP assessed with pt seated EOB noted to be 193/115; 178/111 on second reading. RN notified immediately. Pt c/o feeling dizzy/lightheaded. Returns to sidelying position in bed.    Exercises Other Exercises Other Exercises: Pt education limited 2/2 decreased cognition this date.   Shoulder Instructions      Home Living Family/patient expects to be discharged to:: Private residence Living Arrangements: Children;Spouse/significant other Available Help at Discharge: Family Type of Home: House Home Access: Level entry     Home Layout: One  level     Bathroom Shower/Tub: Occupational psychologist: [place]: None   Additional Comments: Pt denies having equipment. Chart review indicates may have shower seat, grab bars in tub/shower.      Prior Functioning/Environment Level of Independence: Independent        Comments: Pt likely unreliable historian at time of evaluation. Is generally confused and has difficulty following conversation/questions at hand. He reports at least 1 fall geting out of shower. He denies working or driving. States he primarily watches TV, gets out in the yard. Not using an AD for mobility. Per chart pt lives with spouse.        OT Problem List: Decreased strength;Decreased coordination;Decreased range of motion;Decreased cognition;Cardiopulmonary status limiting activity;Decreased activity tolerance;Decreased safety awareness;Impaired balance (sitting and/or standing);Decreased knowledge of use of DME or AE;Impaired UE functional use      OT Treatment/Interventions:      OT Goals(Current goals can be found in the care plan section) Acute Rehab OT Goals Patient Stated Goal: To go home OT Goal Formulation: With patient Time For Goal Achievement: 07/06/19 Potential to Achieve Goals: Fair ADL Goals Pt Will Perform Grooming: sitting;with adaptive  equipment;with supervision;with set-up(c LRAD PRN for safety and improved fxl independence.) Pt Will Transfer to Toilet: ambulating;with min assist;bedside commode(c LRAD PRN for safety and improved fxl independence.) Pt Will Perform Toileting - Clothing Manipulation and hygiene: sit to/from stand;with min guard assist(c LRAD PRN for safety and improved fxl independence.)  OT Frequency:     Barriers to D/C:            Co-evaluation              AM-PAC OT "6 Clicks" Daily Activity     Outcome Measure Help from another person eating meals?: A Little Help from another person taking care of personal grooming?: A  Little Help from another person toileting, which includes using toliet, bedpan, or urinal?: A Lot Help from another person bathing (including washing, rinsing, drying)?: A Lot Help from another person to put on and taking off regular upper body clothing?: A Little Help from another person to put on and taking off regular lower body clothing?: A Lot 6 Click Score: 15   End of Session Nurse Communication: Other (comment)(Pt vitals during session.)  Activity Tolerance: Treatment limited secondary to medical complications (Comment) Patient left: in bed;with call bell/phone within reach;with bed alarm set  OT Visit Diagnosis: Other abnormalities of gait and mobility (R26.89);Hemiplegia and hemiparesis Hemiplegia - Right/Left: Left Hemiplegia - dominant/non-dominant: Dominant Hemiplegia - caused by: Cerebral infarction                Time: 1355-1417 OT Time Calculation (min): 22 min Charges:  OT General Charges $OT Visit: 1 Visit OT Evaluation $OT Eval Moderate Complexity: 1 Mod  Shara Blazing, M.S., OTR/L Ascom: [phone] 06/22/19, 4:21 PM

## 2019-06-22 NOTE — Progress Notes (Addendum)
SLP Cancellation Note  Patient Details Name: Kent [name] MRN: [id number] DOB: 01/29/1956   Cancelled treatment:       Reason Eval/Treat Not Completed: (chart reviewed; pt unavailable currently). Per chart notes, pt became weak and fatigued w/ Left arm weakness then admitted to the hospital last night for further assessment. Pt still in the ED this morning. 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  ST services will f/u w/ pt tomorrow for Cognitive-linguistic evaluation as it will be outside of 24 hours since admit and s/s then. NSG updated.     Orinda Kenner, MS, CCC-SLP Kent [name] 06/22/2019, 12:29 PM

## 2019-06-23 ENCOUNTER — Inpatient Hospital Stay (HOSPITAL_COMMUNITY)
Admit: 2019-06-23 | Discharge: 2019-06-23 | Disposition: A | Discharge status: Home / Self Care | Payer: Self-pay | Attending: Neurology | Admitting: Neurology

## 2019-06-23 DIAGNOSIS — J1282 Pneumonia due to Coronavirus disease 2019: Secondary | ICD-10-CM

## 2019-06-23 DIAGNOSIS — I5032 Chronic diastolic (congestive) heart failure: Secondary | ICD-10-CM

## 2019-06-23 DIAGNOSIS — U071 COVID-19: Secondary | ICD-10-CM

## 2019-06-23 DIAGNOSIS — E785 Hyperlipidemia, unspecified: Secondary | ICD-10-CM

## 2019-06-23 DIAGNOSIS — I6389 Other cerebral infarction: Secondary | ICD-10-CM

## 2019-06-23 LAB — CBC WITH DIFFERENTIAL/PLATELET
Abs Immature Granulocytes: 0.05 10*3/uL (ref 0.00–0.07)
Basophils Absolute: 0 10*3/uL (ref 0.0–0.1)
Basophils Relative: 0 %
Eosinophils Absolute: 0 10*3/uL (ref 0.0–0.5)
Eosinophils Relative: 0 %
HCT: 42.3 % (ref 39.0–52.0)
Hemoglobin: 14.9 g/dL (ref 13.0–17.0)
Immature Granulocytes: 1 %
Lymphocytes Relative: 6 %
Lymphs Abs: 0.5 10*3/uL — ABNORMAL LOW (ref 0.7–4.0)
MCH: 29.3 pg (ref 26.0–34.0)
MCHC: 35.2 g/dL (ref 30.0–36.0)
MCV: 83.1 fL (ref 80.0–100.0)
Monocytes Absolute: 0.4 10*3/uL (ref 0.1–1.0)
Monocytes Relative: 4 %
Neutro Abs: 8.1 10*3/uL — ABNORMAL HIGH (ref 1.7–7.7)
Neutrophils Relative %: 89 %
Platelets: 347 10*3/uL (ref 150–400)
RBC: 5.09 MIL/uL (ref 4.22–5.81)
RDW: 12.7 % (ref 11.5–15.5)
WBC: 9.1 10*3/uL (ref 4.0–10.5)
nRBC: 0 % (ref 0.0–0.2)

## 2019-06-23 LAB — COMPREHENSIVE METABOLIC PANEL
ALT: 24 U/L (ref 0–44)
AST: 23 U/L (ref 15–41)
Albumin: 3.8 g/dL (ref 3.5–5.0)
Alkaline Phosphatase: 87 U/L (ref 38–126)
Anion gap: 14 (ref 5–15)
BUN: 10 mg/dL (ref 8–23)
CO2: 24 mmol/L (ref 22–32)
Calcium: 8.6 mg/dL — ABNORMAL LOW (ref 8.9–10.3)
Chloride: 97 mmol/L — ABNORMAL LOW (ref 98–111)
Creatinine, Ser: 0.99 mg/dL (ref 0.61–1.24)
GFR calc Af Amer: >60 mL/min (ref >60)
GFR calc non Af Amer: >60 mL/min (ref >60)
Glucose, Bld: 155 mg/dL — ABNORMAL HIGH (ref 70–99)
Potassium: 3.6 mmol/L (ref 3.5–5.1)
Sodium: 135 mmol/L (ref 135–145)
Total Bilirubin: 1 mg/dL (ref 0.3–1.2)
Total Protein: 7.1 g/dL (ref 6.5–8.1)

## 2019-06-23 LAB — LIPID PANEL
Cholesterol: 88 mg/dL (ref 0–200)
HDL: 32 mg/dL — ABNORMAL LOW (ref >40)
LDL Cholesterol: 47 mg/dL (ref 0–99)
Total CHOL/HDL Ratio: 2.8 RATIO
Triglycerides: 45 mg/dL (ref <150)
VLDL: 9 mg/dL (ref 0–40)

## 2019-06-23 LAB — C-REACTIVE PROTEIN: CRP: 0.9 mg/dL (ref <1.0)

## 2019-06-23 LAB — MAGNESIUM: Magnesium: 1.6 mg/dL — ABNORMAL LOW (ref 1.7–2.4)

## 2019-06-23 LAB — GLUCOSE, CAPILLARY
Glucose-Capillary: 130 mg/dL — ABNORMAL HIGH (ref 70–99)
Glucose-Capillary: 151 mg/dL — ABNORMAL HIGH (ref 70–99)
Glucose-Capillary: 216 mg/dL — ABNORMAL HIGH (ref 70–99)
Glucose-Capillary: 159 mg/dL — ABNORMAL HIGH (ref 70–99)

## 2019-06-23 LAB — HEMOGLOBIN A1C
Hgb A1c MFr Bld: 9.2 % — ABNORMAL HIGH (ref 4.8–5.6)
Mean Plasma Glucose: 217.34 mg/dL

## 2019-06-23 LAB — FIBRIN DERIVATIVES D-DIMER (ARMC ONLY): Fibrin derivatives D-dimer (ARMC): 1203.85 ng/mL (FEU) — ABNORMAL HIGH (ref 0.00–499.00)

## 2019-06-23 LAB — FERRITIN: Ferritin: 337 ng/mL — ABNORMAL HIGH (ref 24–336)

## 2019-06-23 NOTE — Evaluation (Signed)
Physical Therapy Evaluation Patient Details Name: Kent [name] MRN: [id number] DOB: 05/07/1955 Today's Date: 06/23/2019   History of Present Illness  Kent [name] is an 64 y.o. 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  Patient left the ED, but later returned via [facility] with left extremity weakness. MRI of the brain showed acute/subacute infarcts in the right occipital, right temporal and right thalamic regions.  Some associated petechial hemorrhage noted.  Follow up CTA shows multiple areas of stenosis/occlusion including left vertebral origin moderate to severe, right PCA occlusion, left PCA severe, proximal right M2 moderate to severe.  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  Clinical Impression  Pt admitted with above diagnosis. Pt currently with functional limitations due to the deficits listed below (see "PT Problem List"). Upon entry, pt in bed, asleep, and agreeable to participate. The pt is oriented to 4 mostly, pleasant, conversational, and generally a fair historian, although extensive history is taken from EMR. Neurological examination demonstrates delayed generation of strength on LLE and LUE, impaired coordination of Left hemibody, and moderate Left hemi neglect most limiting during gross mobility. Pt's line bisection is 70/30 deviated to the right. Pt cued to manipulate a marker, needs reminder to attend to his dominant neglected side, but he has trouble with fine motor control, unable to use even for bisection. Bed mobility is performed at supervision, transfers require min-modA, but sustained standing unsupported is not possible a pt has poor awareness of leg buckling and left  lateral lean. BP taken after transfers, pt noted to have BP in 180s/130s, hence returned to supine, session terminated. Subsequent 3 BPs not significantly improved at a diastolic level, although SBP is as low as 150s for 1 of 3. SpO2 remains WNL on room air throughout. Functional mobility assessment demonstrates increased effort/time requirements, poor tolerance, and need for physical assistance, whereas the patient performed these at a higher level of independence PTA. Pt will benefit from skilled PT intervention to increase independence and safety with basic mobility in preparation for discharge to the venue listed below.       Follow Up Recommendations Supervision for mobility/OOB;SNF    Equipment Recommendations  None recommended by PT    Recommendations for Other Services       Precautions / Restrictions Precautions Precautions: Fall Restrictions Weight Bearing Restrictions: No      Mobility  Bed Mobility Overal bed mobility: Needs Assistance       Supine to sit: Supervision   Sit to sidelying: Supervision General bed mobility comments: supervision for line safety, pt aware of LUE and intermittently addressing with RUE as needed, but does not attempt functional use.  Transfers Overall transfer level: Modified independent Equipment used: 1 person hand held assist             General transfer comment: performed twice, sustained standing with progressive failure of LLE to support, progressing Left lateral lean with BLE buckling, pt has poor awareness and zero capability to correct.  Ambulation/Gait Ambulation/Gait assistance: (not safe to attempt due to vitals.)              Stairs            Wheelchair Mobility    Modified Rankin (Stroke  Patients Only)       Balance Overall balance assessment: Needs assistance Sitting-balance support: Feet supported;No upper extremity supported Sitting balance-Leahy Scale: Good     Standing balance support:  During functional activity;Single extremity supported Standing balance-Leahy Scale: Zero                               Pertinent Vitals/Pain Pain Assessment: No/denies pain    Home Living Family/patient expects to be discharged to:: Private residence Living Arrangements: Children;Spouse/significant other Available Help at Discharge: Family Type of Home: House Home Access: Level entry     Home Layout: One level Home Equipment: None Additional Comments: Pt denies having equipment. Chart review indicates may have shower seat, grab bars in tub/shower.    Prior Function Level of Independence: Independent         Comments: History taken from chart     Hand Dominance   Dominant Hand: Left    Extremity/Trunk Assessment   Upper Extremity Assessment Upper Extremity Assessment: LUE deficits/detail;Overall [facility] for tasks assessed RUE Deficits / Details: delayed activation, mild to moderate inattention/neglect; a weak 5/5 when tested, but largely disengaged for functional mobilty.    Lower Extremity Assessment Lower Extremity Assessment: LLE deficits/detail;Overall [facility] for tasks assessed;Generalized weakness LLE Deficits / Details: 5/5 strength, but delayed acitvation as with LUE. Poor functional use in standing due to neglect.       Communication   Communication: Expressive difficulties;Other (comment)(slurring)  Cognition Arousal/Alertness: Awake/alert Behavior During Therapy: WFL for tasks assessed/performed;Impulsive Overall Cognitive Status: Impaired/Different from baseline                                        General Comments      Exercises     Assessment/Plan    PT Assessment Patient needs continued PT services  PT Problem List Decreased strength;Decreased range of motion;Decreased activity tolerance;Decreased balance;Decreased mobility;Decreased coordination;Decreased safety awareness;Decreased knowledge of  precautions;Cardiopulmonary status limiting activity       PT Treatment Interventions DME instruction;Balance training;Gait training;Stair training;Functional mobility training;Therapeutic activities;Therapeutic exercise;Neuromuscular re-education;Cognitive remediation;Patient/family education    PT Goals (Current goals can be found in the Care Plan section)  Acute Rehab PT Goals Patient Stated Goal: To go home PT Goal Formulation: With patient Time For Goal Achievement: 07/07/19 Potential to Achieve Goals: Fair    Frequency 7X/week   Barriers to discharge Inaccessible home environment;Decreased caregiver support      Co-evaluation               AM-PAC PT "6 Clicks" Mobility  Outcome Measure Help needed turning from your back to your side while in a flat bed without using bedrails?: A Little Help needed moving from lying on your back to sitting on the side of a flat bed without using bedrails?: A Little Help needed moving to and from a bed to a chair (including a wheelchair)?: A Lot Help needed standing up from a chair using your arms (e.g., wheelchair or bedside chair)?: A Lot Help needed to walk in hospital room?: A Lot Help needed climbing 3-5 steps with a railing? : Total 6 Click Score: 13    End of Session   Activity Tolerance: Patient tolerated treatment well;Treatment limited secondary to medical complications (Comment)(BP irratic and SBP too high.) Patient left: in bed;with call bell/phone within reach Nurse Communication: Mobility status PT  Visit Diagnosis: Unsteadiness on feet (R26.81);Difficulty in walking, not elsewhere classified (R26.2);Hemiplegia and hemiparesis Hemiplegia - Right/Left: Left Hemiplegia - dominant/non-dominant: Dominant Hemiplegia - caused by: Cerebral infarction    Time: 0903-0149 PT Time Calculation (min) (ACUTE ONLY): 33 min   Charges:   PT Evaluation $PT Eval High Complexity: 1 High PT Treatments $Therapeutic Exercise: 8-22  mins        4:35 PM, 06/23/19 Etta Grandchild, PT, DPT Physical Therapist - [facility]  [phone] ([place])   Kerrigan Gombos C 06/23/2019, 4:29 PM

## 2019-06-23 NOTE — Evaluation (Addendum)
Speech Language Pathology Evaluation Patient Details Name: Kent [name] MRN: [id number] DOB: Aug 15, 1955 Today's Date: 06/23/2019 Time: 9485-4627 SLP Time Calculation (min) (ACUTE ONLY): 50 min  Problem List:  Patient Active Problem List   Diagnosis Date Noted  . Generalized weakness 06/22/2019  . Hypertensive urgency 06/22/2019  . Stroke ([place]) 06/22/2019  . Acute respiratory failure with hypoxia ([place]) 06/22/2019  . Diabetes mellitus without complication ([place]) 03/50/0938  . Chronic diastolic CHF (congestive heart failure) ([place]) 06/22/2019  . Pneumonia due to COVID-19 virus 06/22/2019  . Left arm weakness 06/22/2019  . Pulmonary nodules 07/12/2016  . H/O necrotizing fasciitis 05/18/2016  . Oral pain of unknown etiology 05/18/2016  . H/O osteomyelitis 05/18/2016  . Sepsis ([place]) 01/08/2016  . Advance care planning 01/07/2016  . Tachycardia 01/07/2016  . Dry gangrene ([place]) 01/04/2016  . Hypokalemia 12/21/2015  . Protein-calorie malnutrition, severe 12/11/2015  . Electrolyte imbalance 12/11/2015  . Intractable nausea and vomiting 12/10/2015  . Dehydration 11/16/2015  . Odynophagia 11/16/2015  . Anemia 11/16/2015  . Thrombocytopenia ([place]) 11/16/2015  . Hyponatremia 11/12/2015  . Hypomagnesemia 11/12/2015  . Cancer associated pain 10/22/2015  . Carcinoma of base of tongue ([place]) 09/10/2015  . Low back pain 04/06/2015  . Hypertension 12/05/2014  . Hypertensive CKD (chronic kidney disease) 12/05/2014  . Hyperlipidemia 12/05/2014  . Class II obesity 12/05/2014  . Erectile dysfunction 12/05/2014  . Nonalcoholic steatohepatitis 18/29/9371   Past Medical History:  Past Medical History:  Diagnosis Date  . Arthritis   . Diabetes mellitus without complication ([place])   . Hyperlipidemia   . Hypertension   . Obesity   . Osteomyelitis ([place])    From pt chart  . Renal insufficiency    CKD noted on chart  . Staph infection 05/29/2016   Nov 2017. Staph infection in LT shoulder  requiring debridement and grafts  . Status post debridement   . Stomach cancer ([place])    had tumor removed- [facility]  . 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 EXCISION OF TONGUE LESION Left 06/19/2016   Procedure: EXCISION OF TONGUE LESION;  Surgeon: Margaretha Sheffield, MD;  Location: [place];  Service: ENT;  Laterality: Left;  diabetic - insulin and oral meds - not using either currently  . gastric tumor removed  2010  . I & D EXTREMITY Left 01/09/2016   Procedure: IRRIGATION AND DEBRIDEMENT EXTREMITY;  Surgeon: Robert Bellow, MD;  Location: [facility];  Service: General;  Laterality: Left;  . LARYNGOSCOPY N/A 06/19/2016   Procedure: LARYNGOSCOPY;  Surgeon: Margaretha Sheffield, MD;  Location: [place];  Service: ENT;  Laterality: N/A;  . PERIPHERAL VASCULAR CATHETERIZATION Right 12/31/2015   Procedure: Lower Extremity Angiography;  Surgeon: Algernon Huxley, MD;  Location: [place] [facility];  Service: Cardiovascular;  Laterality: Right;  . tongue mass biopsy     HPI:  Pt is an 64 y.o. 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  Patient left the ED, but later returned via [facility] with left extremity weakness. MRI of the brain showed acute/subacute infarcts in the right occipital, right temporal  and right thalamic regions.  Some associated petechial hemorrhage noted.  Follow up CTA shows multiple areas of stenosis/occlusion including left vertebral origin moderate to severe, right PCA occlusion, left PCA severe, proximal right M2 moderate to severe.  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    Assessment / Plan / Recommendation Clinical Impression  Pt appears to present w/ Mild-Mod Cognitive-Linguistic deficits moreso at the higher cognitive functioning level -- more executive function level w/ more complex tasks; Impulsivity noted in decision making. Prior to hospital admission, pt reports he was generally independent with ADLs, however, pt unable to provide full history/PLOF during evaluation. Unable to determine full scope of pt's responsibilities of more complex Cognitive tasks in ADLs in the home or if family managed those. Pt is currently exhibiting Left Vision Field deficits/neglect which also impacted his reading and awareness of pictured items unless moved into his Vision field(cued). Pt is Left handed w/ current LUE weakness this admit. MRI of the brain showed acute/subacute infarcts in the right occipital, right temporal and right thalamic regions; some associated petechial hemorrhage noted. Written tasks were noted completed at this evaluation. As noted, Reading tasks were impacted by Left visual field deficits. During informal bedside assessment of Cognitive-linguistic tasks, pt demonstrated adequate skills w/ Fluency task(naming of group members), Orientation, Abstraction, Naming of objects then pictured items. During informal language tasks, pt was able to answer general y/n questions and follow 2-step commands. Expressive language was adequate w/ no Motor Speech deficits noted. Pt exhibited suspected deficits in areas of Attention, Calculation, Executive Functioning(verbal), and Delayed Recall. Pt was able to complete partial Visuoperception tasks in light of Vision deficits. W/ pt's decreased Attention during tasks, verbal cues were often sufficient in redirecting pt for follow through w/ tasks. Reducing distractions in environment were helpful as well. Suspect a decline in Anticipatory Awareness, Organization/Sequencing, and Safety Judgement.  Pt would benefit from f/u at Discharge by Skilled ST services in a more structured setting for ongoing formal assessment and development of a POC to address impairments and functional limitations in order to maximize safety and independence while minimizing Caregiver burden. CM/SW updated.   Of note, during this exam, pt's Lunch meal arrived and pt given assistance w/ tray prep d/t LUE weakness and Cognitive decline. Pt was able to feed self w/ setup support. No overt s/s of aspiration were noted as pt began his lunch meal including thin liquids via Straw. General Aspiration precautions are recommended in light of any oral weakness sec. to pt's Baseline lingual Ca/txs.     SLP Assessment  SLP Recommendation/Assessment: All further Speech Lanaguage Pathology  needs can be addressed in the next venue of care SLP Visit Diagnosis: Cognitive communication deficit (R41.841);Frontal lobe and executive function deficit Frontal lobe and executive function deficit following: Cerebral infarction    Follow Up Recommendations  Home health SLP(vs skilled rehab)    Frequency and Duration (tbd)  (tbd)      SLP Evaluation Cognition  Overall Cognitive Status: No family/caregiver present to determine baseline cognitive functioning Arousal/Alertness: Awake/alert Orientation Level: Oriented X4 Attention: Focused;Sustained Focused Attention: Appears intact Sustained Attention: Impaired Sustained Attention Impairment: Verbal complex;Functional complex Memory: Impaired Memory Impairment: Retrieval deficit Awareness: Impaired Awareness Impairment: Anticipatory impairment Problem Solving: Impaired Executive Function: Organizing;Sequencing Sequencing: Impaired Organizing: Impaired Behaviors: Perseveration;Restless(frequently asked about his IV meds) Safety/Judgment: (difficult to determine)       Comprehension  Auditory Comprehension Overall Auditory Comprehension: Appears within functional limits for  tasks assessed Yes/No Questions: Within  Functional Limits Commands: Within Functional Limits(basic) Conversation: Simple Interfering Components: Attention Reading Comprehension Reading Status: Impaired(? - impacted by Vision though)    Expression Expression Primary Mode of Expression: Verbal Verbal Expression Overall Verbal Expression: Appears within functional limits for tasks assessed Initiation: No impairment Level of Generative/Spontaneous Verbalization: Sentence Repetition: No impairment Naming: No impairment Interfering Components: Attention Non-Verbal Means of Communication: Not applicable Written Expression Dominant Hand: Left Written Expression: Not tested   Oral / Motor  Oral Motor/Sensory Function Overall Oral Motor/Sensory Function: Mild impairment Facial ROM: Within Functional Limits Facial Symmetry: Within Functional Limits Facial Strength: Within Functional Limits Lingual ROM: Within Functional Limits(grossly) Lingual Symmetry: Within Functional Limits Lingual Strength: Reduced(incoordinated movements) Mandible: Within Functional Limits Motor Speech Overall Motor Speech: Appears within functional limits for tasks assessed Respiration: Within functional limits Phonation: Normal Resonance: Within functional limits Articulation: Within functional limitis Intelligibility: Intelligible Motor Speech Errors: Not applicable   GO                     Orinda Kenner, MS, CCC-SLP Ashle Stief 06/23/2019, 4:04 PM

## 2019-06-23 NOTE — Progress Notes (Signed)
*  PRELIMINARY RESULTS* Echocardiogram 2D Echocardiogram has been performed.  Kent [name], Kent [name] 06/23/2019, 2:00 PM

## 2019-06-23 NOTE — Consult Note (Signed)
Referring Physician:  No referring provider defined for this encounter.  Primary Physician:  System, Pcp Not In  Chief Complaint:  LUE weakness  History of Present Illness: 06/23/2019 Kent [name] is a 64 y.o. male who presents with the chief complaint of weakness and lethargy.  He went to bed on 06/22/19 around 0030 and awoke with weakness of his LUE.  He presented to the ER, where he was found to have a multifocal stroke with initial NIHSS 4.  He was not a candidate for tPA.  Neurology was consulted and evaluated with CTA, which showed 2 small aneurysms.  I was consulted for recommendations.  He now reports ongoing LUE weakness.  He is also COVID+.  He denies headache, n, v. He denies any severe headache.  He is not a smoker.   Review of Systems:  A 10 point review of systems is negative, except for the pertinent positives and negatives detailed in the HPI.  Past Medical History: Past Medical History:  Diagnosis Date  . Arthritis   . Diabetes mellitus without complication ([place])   . Hyperlipidemia   . Hypertension   . Obesity   . Osteomyelitis ([place])    From pt chart  . Renal insufficiency    CKD noted on chart  . Staph infection 05/29/2016   Nov 2017. Staph infection in LT shoulder requiring debridement and grafts  . Status post debridement   . Stomach cancer ([place])    had tumor removed- [facility]  . 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 EXCISION OF TONGUE LESION Left 06/19/2016   Procedure: EXCISION OF TONGUE LESION;  Surgeon: Margaretha Sheffield, MD;  Location: [place];  Service: ENT;  Laterality: Left;  diabetic - insulin and oral meds - not using either currently  . gastric tumor removed  2010  . I & D EXTREMITY Left 01/09/2016   Procedure: IRRIGATION AND DEBRIDEMENT EXTREMITY;  Surgeon: Robert Bellow, MD;  Location: [facility];   Service: General;  Laterality: Left;  . LARYNGOSCOPY N/A 06/19/2016   Procedure: LARYNGOSCOPY;  Surgeon: Margaretha Sheffield, MD;  Location: [place];  Service: ENT;  Laterality: N/A;  . PERIPHERAL VASCULAR CATHETERIZATION Right 12/31/2015   Procedure: Lower Extremity Angiography;  Surgeon: Algernon Huxley, MD;  Location: [place] [facility];  Service: Cardiovascular;  Laterality: Right;  . tongue mass biopsy      Allergies: Allergies as of 06/22/2019  . (No Known Allergies)    Medications:  Current Facility-Administered Medications:  .  acetaminophen (TYLENOL) tablet 650 mg, 650 mg, Oral, Q6H PRN, Ivor Costa, MD .  albuterol (VENTOLIN HFA) 108 (90 Base) MCG/ACT inhaler 2 puff, 2 puff, Inhalation, Q4H PRN, Ivor Costa, MD .  ascorbic acid (VITAMIN C) tablet 500 mg, 500 mg, Oral, Daily, Ivor Costa, MD, 500 mg at 06/23/19 1126 .  aspirin EC tablet 81 mg, 81 mg, Oral, Daily, Ivor Costa, MD, 81 mg at 06/23/19 1126 .  atorvastatin (LIPITOR) tablet 40 mg, 40 mg, Oral, Daily, Ivor Costa, MD, 40 mg at 06/23/19 1126 .  dextromethorphan-guaiFENesin (MUCINEX DM) 30-600 MG per 12 hr tablet 1 tablet, 1 tablet, Oral, BID PRN, Ivor Costa, MD .  enoxaparin (LOVENOX) injection 40 mg, 40 mg, Subcutaneous, Q24H, Ivor Costa, MD, 40 mg at 06/22/19 1254 .  hydrALAZINE (APRESOLINE) injection 5 mg, 5 mg, Intravenous,  Q2H PRN, Ivor Costa, MD .  insulin aspart (novoLOG) injection 0-5 Units, 0-5 Units, Subcutaneous, QHS, Niu, Xilin, MD .  insulin aspart (novoLOG) injection 0-9 Units, 0-9 Units, Subcutaneous, TID WC, Ivor Costa, MD, 3 Units at 06/23/19 1716 .  ipratropium (ATROVENT HFA) inhaler 2 puff, 2 puff, Inhalation, Q4H, Ivor Costa, MD, 2 puff at 06/23/19 1716 .  methylPREDNISolone sodium succinate (SOLU-MEDROL) 40 mg/mL injection 40 mg, 40 mg, Intravenous, Q12H, Ivor Costa, MD, 40 mg at 06/23/19 1126 .  ondansetron (ZOFRAN) injection 4 mg, 4 mg, Intravenous, Q8H PRN, Ivor Costa, MD .  Margrett Rud remdesivir  200 mg in sodium chloride 0.9% 250 mL IVPB, 200 mg, Intravenous, Once, Stopped at 06/22/19 1527 **FOLLOWED BY** remdesivir 100 mg in sodium chloride 0.9 % 100 mL IVPB, 100 mg, Intravenous, Daily, Nazari, Walid A, RPH, Last Rate: 200 mL/hr at 06/23/19 1135, 100 mg at 06/23/19 1135 .  senna-docusate (Senokot-S) tablet 1 tablet, 1 tablet, Oral, QHS PRN, Ivor Costa, MD .  zinc sulfate capsule 220 mg, 220 mg, Oral, Daily, Ivor Costa, MD, 220 mg at 06/23/19 1126   Social History: Social History   Tobacco Use  . Smoking status: Never Smoker  . Smokeless tobacco: Never Used  Substance Use Topics  . Alcohol use: No    Alcohol/week: 0.0 standard drinks  . Drug use: No    Family Medical History: Family History  Problem Relation Age of Onset  . Heart disease Mother   . Hypertension Father   . Cancer Sister        stomach  . Heart disease Brother        MI  . Cancer Daughter   . Cancer Son        testicular  . ADD / ADHD Sister     Physical Examination: Vitals:   06/23/19 1600 06/23/19 1720  BP: (!) 172/125 (!) 172/85  Pulse: 97 95  Resp: 16 20  Temp: 98.1 F (36.7 C)   SpO2: 98% 97%     General: Patient is well developed, well nourished, calm, collected, and in no apparent distress.  Psychiatric: Patient is non-anxious.  Head:  Pupils equal, round, and reactive to light.  ENT:  Oral mucosa appears well hydrated.  Neck:   Supple.  Full range of motion.  Respiratory: Patient is breathing without any difficulty.  Extremities: No edema.  Vascular: Palpable pulses in dorsal pedal vessels.  Skin:   On exposed skin, there are no abnormal skin lesions.  NEUROLOGICAL:  General: In no acute distress.   Awake, alert, oriented to person, place, and time.  Pupils equal round and reactive to light.  Facial tone shows slight asymmetry with some slight droop on left.  Tongue protrusion is midline.  There is left drift.    Strength: Side Biceps Triceps Deltoid Interossei Grip  Wrist Ext. Wrist Flex.  R 5 5 5 5 5 5 5   L 4- 4 4- 4- 4 4- 4-   Side Iliopsoas Quads Hamstring PF DF EHL  R 5 5 5 5 5 5   L 5 5 5 5 5 5    Reflexes are 1+ and symmetric at the biceps, triceps, brachioradialis, patella and achilles.   Bilateral upper and lower extremity sensation is intact to light touch and pin prick.  Clonus is not present.  Toes are down-going.  Gait is untested. Hoffman's is absent.  Imaging: CT Head, CTA Head and Neck 06/22/2019 IMPRESSION: CT head:  1. Acute/early subacute infarction changes within the right  occipital and temporal lobes, as well as right thalamus, have not significantly changed in extent as compared to examinations performed earlier the same day. 2. No interval intracranial abnormality is identified. 3. Stable background moderate chronic small vessel ischemic disease and mild generalized parenchymal atrophy.  CTA neck:  1. The bilateral common and internal carotid arteries are patent within the neck without significant stenosis. 2. The vertebral arteries are codominant and patent within the neck bilaterally. Moderate/severe atherosclerotic narrowing at the origin of the left vertebral artery. 3. Multifocal ill-defined and nodular opacities within the periphery of the imaged lung apices which are likely infectious in etiology. Septic emboli cannot be excluded. 4. Ill-defined soft tissue with irregular enhancement and regions of internal hypodensity within the left neck measuring 3.9 x 1.9 x 5.4 cm. Findings are incompletely assessed on this arterial phase scan, but suspicious for a residual/recurrent tumor deposit or nodal conglomerate. Non-emergent contrast-enhanced neck CT is recommended for further evaluation.  CTA head:  1. Probable fetal origin right posterior cerebral artery. The right posterior communicating artery becomes occluded at its distal aspect. No definite reconstitution of the right posterior cerebral artery is  identified more distally. 2. Fetal origin left posterior cerebral artery with high-grade focal stenosis within the left posterior communicating artery. 3. Focal high-grade stenosis within the V4 right vertebral artery. 4. Atherosclerotic irregularity of the M2 and more distal MCA branch vessels bilaterally. Most notably there are multifocal moderate/severe stenoses within a proximal M2 right MCA branch. 5. Bulbous anterior communicating artery complex measuring up to 3.5 mm suspicious for aneurysm. Catheter-based angiography is recommended for further evaluation, as clinically warranted. 6. 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 This too could be further assessed at time of catheter based angiography.  Electronically Signed: By: Kellie Simmering DO On: 06/22/2019 10:46   I have personally reviewed the images and agree with the above interpretation.  Labs: CBC Latest Ref Rng & Units 06/23/2019 06/21/2019 05/23/2016  WBC 4.0 - 10.5 K/uL 9.1 6.8 7.5  Hemoglobin 13.0 - 17.0 g/dL 14.9 13.8 12.8(L)  Hematocrit 39.0 - 52.0 % 42.3 38.9(L) 36.4(L)  Platelets 150 - 400 K/uL 347 303 332       Assessment and Plan: Mr. Mole is a pleasant 64 y.o. 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  He has multifocal stenosis of multiple intracranial vessels.  - no intervention on these likely incidental aneurysms at the moment - ok to anticoagulate if neurologist feels this is indicated - I will see him in follow-up after he recovers from [place] to establish care and manage his aneurysms.  No surgical intervention is indicated for intracranial stenosis of this type.  Bypass would not be appropriate.  I would recommend medical therapy as directed by Dr. Doy Mince.   Shaunta Oncale K. Izora Ribas MD, [facility] of Neurosurgery

## 2019-06-23 NOTE — Plan of Care (Signed)
Pt + for R occipital, R temporal and R thalamic infarcts - has L arm deficit/ataxia, sensory deficit and w/regard to the phrases, he is not seeing the first word in the sentences. He can perform the dorsiflexion and plantar flexion with left leg alone, but not at the same time as the right leg. Pt has been very drowsy this shift.

## 2019-06-23 NOTE — Consult Note (Signed)
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  He completed concurrent chemotherapy with cisplatin along with daily XRT in October 2017.  PET scan from March 2018 and biopsy from April 2018 revealed no evidence of disease.  Patient was subsequently lost to follow-up.  He recently presented to the hospital with weakness in his extremities.  He also had a positive Covid test four days prior to admission.  Imaging was suggestive of acute/early subacute CVA.  CTA of the neck revealed an ill-defined soft tissue density suspicious for recurrent disease.  Patient will need a dedicated neck CT for further evaluation.  Ultimately upon discharge and when patient recovers from his CVA as well as Covid infection, he will require a PET scan and biopsy to confirm possible recurrence.  No further intervention is needed as an inpatient.  Will continue to follow closely and arrange follow-up upon discharge.  Appreciate consult, will follow.

## 2019-06-23 NOTE — Progress Notes (Signed)
PROGRESS NOTE    Kent [name]  WUJ:[id number] DOB: 30-May-1955 DOA: 06/22/2019 PCP: System, Pcp Not In    Brief Narrative: HPI: Kent [name] is a 64 y.o. 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  Pt had positive Covid19 test 4 days ago. He denies cough or shortness breath, but per RN in ED, pt has mild dry cough. No fever or chills. He states that he has been feeling weak and fatigue since then. He states tha0t he was asleep and when he woke up he feel weaker in left arm. He also states that he is having intermittent weakness in his legs bilaterally. He has dizziness when he stands up and walks. Per report, pt had possible slurry speech.  No vision loss or hearing loss. Pt reports incontinence of bladder and bowels.  No nausea, vomiting, or abdominal pain.  His son states that he had mild loose stool bowel movement at home.  Patient had oxygen desaturation to 86% on room air initially, currently 96% on room air.  Blood pressure 201/141, which improved to 123/93. Cardene gttt is ordered, but nopt started per RN in ED. HR 39 -108, RR21.  Chest x-ray showed right upper lobe patchy infiltration.  Patient is admitted to [place] bed as inpatient.  4/22: Patient seen and examined.  Still very sleepy and lethargic.  Does awaken easily and answers all my questions appropriately.  Neurosurgery consulted for aneurysms noted on CT angio.  Further initial review of imaging no indication for intervention.  Aneurysms appear small.  Recommend follow-up for neck mass noted on CT.   Assessment & Plan:   Principal Problem:   Stroke [facility]) Active Problems:   Hypertension   Hyperlipidemia   Hypokalemia   Hypertensive urgency   Acute respiratory failure with  hypoxia (HCC)   Diabetes mellitus without complication (HCC)   Chronic diastolic CHF (congestive heart failure) (HCC)   Pneumonia due to COVID-19 virus  Stroke MRI of brain showed multifocal changes of acute/early subacute infarction within the right PCA territory involving the right occipital and temporal lobes.  Small amount of associated petechial hemorrhage within the right occipital lobe.  Acute/early subacute infarcts are also present within the right thalamus.   Neurology, Dr. Doy Mince is consulted. Plan: Echo with bubble, study completed, pending read will hold oral Bp meds to allow permissive HTN in the setting of acute stroke  continue home ASA (pt was given 324 mg of ASA already) fasting lipid panel: no HLD noted  HbA1c 9.2, poor control of DM RN swallow screen, passed, diet advanced UDS: negative PT and OT consults deferred due to htn, inability to follow commands  Hypertension and Hypertensive urgency  Blood pressure 201/141, which improved to 123/93.  Cardene gtt is ordered, but not started in ED.  -will hold home Bp meds -will give hydralazine for SBP>220 or dCBP>120 to allow permissive hypertension  Hyperlipidemia: - Lipitor 40 mg daily  Hypokalemia:  K= 3.2 on admission. - Repleted - Check Mg level: NL  Acute respiratory failure with hypoxia due to [place] due to COVID-19 virus  initially pt had oxygen desaturation to 86% on room air.   Chest x-ray showed right upper lobe patchy infiltration. Plan: - Remdesivir per pharm (day 2/5) - Solumedrol 40 mg bid (day 2/10) - vitamin  C, zinc.  - Bronchodilators - PRN Mucinex for cough - f/u Blood culture - Follow inflammatory markers  Diabetes mellitus without complication (HCC)  Most recent A1c 9.2, poorly controled.  Patient is taking Metformin at home -SSI - Hold home metformin  Chronic diastolic CHF (congestive heart failure) ([place]) 2D echo on 01/09/2016 showed EF of 45-50%.   No leg edema or JVD.    CHF seems to be compensated.  History of SCC head and neck History of GIST Per patients son he has been lost to onc followup since 2017 CT angio neck demonstrates soft tissue mass concerning for tumor recurrence Patients last visit to oncology at [facility] 2017 Will consult oncology for further recommendations    DVT prophylaxis: lovenox Code Status: Partial Family Communication: Patient's son Kent [name] via phone [phone] on 06/23/2019 Disposition Plan: Status is: Inpatient  Remains inpatient appropriate because:Inpatient level of care appropriate due to severity of illness   Dispo: The patient is from: Home              Anticipated d/c is to: Home              Anticipated d/c date is: 2 days              Patient currently is not medically stable to d/c.          Consultants:   Neurology   Procedures:   2D echo with bubble  Antimicrobials:   Remdesivir    Subjective: Seen and examined Lethargic but easily awakened No pain complaints  Objective: Vitals:   06/22/19 1400 06/22/19 1957 06/23/19 0002 06/23/19 0748  BP: (!) 176/122  (!) 156/78 135/72  Pulse: (!) 102 95 95 81  Resp: 19 18 20 18   Temp:   98.3 F (36.8 C) (!) 97.5 F (36.4 C)  TempSrc:   Oral Axillary  SpO2: 99% 96% 98% 97%  Weight:      Height:        Intake/Output Summary (Last 24 hours) at 06/23/2019 1312 Last data filed at 06/23/2019 0000 Gross per 24 hour  Intake 650 ml  Output 300 ml  Net 350 ml   Filed Weights   06/22/19 0523  Weight: 81.6 kg    Examination:  General exam: Appears calm and comfortable  Respiratory system: Clear to auscultation. Respiratory effort normal. Cardiovascular system: S1 & S2 heard, RRR. No JVD, murmurs, rubs, gallops or clicks. No pedal edema. Gastrointestinal system: Abdomen is nondistended, soft and nontender. No organomegaly or masses felt. Normal bowel sounds heard. Central nervous system: Decreased muscle strength in left arm and left  leg.  Alert oriented x3 Extremities: Symmetric 5 x 5 power. Skin: No rashes, lesions or ulcers Psychiatry: Judgement and insight appear normal. Mood & affect appropriate.     Data Reviewed: I have personally reviewed following labs and imaging studies  CBC: Recent Labs  Lab 06/21/19 2203 06/23/19 0405  WBC 6.8 9.1  NEUTROABS  --  8.1*  HGB 13.8 14.9  HCT 38.9* 42.3  MCV 81.9 83.1  PLT 303 829   Basic Metabolic Panel: Recent Labs  Lab 06/21/19 2203 06/22/19 0600 06/23/19 0405  NA 134* 136 135  K 3.7 3.2* 3.6  CL 100 98 97*  CO2 26 28 24   GLUCOSE 153* 178* 155*  BUN 6* 5* 10  CREATININE 0.94 0.85 0.99  CALCIUM 8.7* 8.6* 8.6*  MG  --   --  1.6*   GFR: Estimated Creatinine Clearance: 77.1 mL/min (by  C-G formula based on SCr of 0.99 mg/dL). Liver Function Tests: Recent Labs  Lab 06/22/19 0722 06/23/19 0405  AST 27 23  ALT 20 24  ALKPHOS 87 87  BILITOT 1.0 1.0  PROT 7.2 7.1  ALBUMIN 3.9 3.8   No results for input(s): LIPASE, AMYLASE in the last 168 hours. No results for input(s): AMMONIA in the last 168 hours. Coagulation Profile: Recent Labs  Lab 06/22/19 0600  INR 1.0   Cardiac Enzymes: No results for input(s): CKTOTAL, CKMB, CKMBINDEX, TROPONINI in the last 168 hours. BNP (last 3 results) No results for input(s): PROBNP in the last 8760 hours. HbA1C: Recent Labs    06/23/19 0405  HGBA1C 9.2*   CBG: Recent Labs  Lab 06/22/19 1123 06/22/19 1620 06/22/19 2118 06/23/19 0747 06/23/19 1137  GLUCAP 197* 127* 144* 130* 151*   Lipid Profile: Recent Labs    06/22/19 0510 06/23/19 0405  CHOL  --  88  HDL  --  32*  LDLCALC  --  47  TRIG 80 45  CHOLHDL  --  2.8   Thyroid Function Tests: No results for input(s): TSH, T4TOTAL, FREET4, T3FREE, THYROIDAB in the last 72 hours. Anemia Panel: Recent Labs    06/22/19 0800 06/23/19 0405  FERRITIN 360* 337*   Sepsis Labs: Recent Labs  Lab 06/22/19 0600 06/22/19 0655  PROCALCITON <0.10  --    LATICACIDVEN  --  1.4    Recent Results (from the past 240 hour(s))  Respiratory Panel by RT PCR (Flu A&B, Covid) - Urine, Clean Catch     Status: Abnormal   Collection Time: 06/22/19  5:10 AM   Specimen: Urine, Clean Catch  Result Value Ref Range Status   SARS Coronavirus 2 by RT PCR POSITIVE (A) NEGATIVE Final    Comment: RESULT CALLED TO, READ BACK BY AND VERIFIED WITH: GREG MOYER AT 7341 06/22/19 SDR (NOTE) SARS-CoV-2 target nucleic acids are DETECTED. SARS-CoV-2 RNA is generally detectable in upper respiratory specimens  during the acute phase of infection. Positive results are indicative of the presence of the identified virus, but do not rule out bacterial infection or co-infection with other pathogens not detected by the test. Clinical correlation with patient history and other diagnostic information is necessary to determine patient infection status. The expected result is Negative. Fact Sheet for Patients:  PinkCheek.be Fact Sheet for Healthcare Providers: GravelBags.it This test is not yet approved or cleared by the [place] FDA and  has been authorized for detection and/or diagnosis of SARS-CoV-2 by FDA under an Emergency Use Authorization (EUA).  This EUA will remain in effect (meaning this test can be used) for th e duration of  the COVID-19 declaration under Section 564(b)(1) of the Act, 21 U.S.C. section 360bbb-3(b)(1), unless the authorization is terminated or revoked sooner.    Influenza A by PCR NEGATIVE NEGATIVE Final   Influenza B by PCR NEGATIVE NEGATIVE Final    Comment: (NOTE) The Xpert Xpress SARS-CoV-2/FLU/RSV assay is intended as an aid in  the diagnosis of influenza from Nasopharyngeal swab specimens and  should not be used as a sole basis for treatment. Nasal washings and  aspirates are unacceptable for Xpert Xpress SARS-CoV-2/FLU/RSV  testing. Fact Sheet for  Patients: PinkCheek.be Fact Sheet for Healthcare Providers: GravelBags.it This test is not yet approved or cleared by the [place] FDA and  has been authorized for detection and/or diagnosis of SARS-CoV-2 by  FDA under an Emergency Use Authorization (EUA). This EUA will remain  in effect (  meaning this test can be used) for the duration of the  Covid-19 declaration under Section 564(b)(1) of the Act, 21  U.S.C. section 360bbb-3(b)(1), unless the authorization is  terminated or revoked. Performed at [facility], [address] [place], [place] [address]   Blood culture (routine x 2)     Status: None (Preliminary result)   Collection Time: 06/22/19  6:55 AM   Specimen: BLOOD  Result Value Ref Range Status   Specimen Description BLOOD LEFT AC  Final   Special Requests   Final    BOTTLES DRAWN AEROBIC AND ANAEROBIC Blood Culture adequate volume   Culture   Final    NO GROWTH < 24 HOURS Performed at [facility], [address], [place], [place] [address]    Report Status PENDING  Incomplete  Blood culture (routine x 2)     Status: None (Preliminary result)   Collection Time: 06/22/19  6:55 AM   Specimen: BLOOD  Result Value Ref Range Status   Specimen Description BLOOD LEFT ARM  Final   Special Requests   Final    BOTTLES DRAWN AEROBIC AND ANAEROBIC Blood Culture adequate volume   Culture   Final    NO GROWTH < 24 HOURS Performed at [facility], [address], [place], [place] [address]    Report Status PENDING  Incomplete         Radiology Studies: CT ANGIO HEAD W OR WO CONTRAST  Addendum Date: 06/22/2019   ADDENDUM REPORT: 06/22/2019 10:54 ADDENDUM: These results were called by telephone at the time of interpretation on 06/22/2019 at 10:54 am to provider Alexis Goodell , who verbally acknowledged these results. Electronically Signed   By: Kellie Simmering DO   On: 06/22/2019 10:54    Result Date: 06/22/2019 CLINICAL DATA:  Stroke, follow-up. EXAM: CT ANGIOGRAPHY HEAD AND NECK TECHNIQUE: Multidetector CT imaging of the head and neck was performed using the standard protocol during bolus administration of intravenous contrast. Multiplanar CT image reconstructions and MIPs were obtained to evaluate the vascular anatomy. Carotid stenosis measurements (when applicable) are obtained utilizing NASCET criteria, using the distal internal carotid diameter as the denominator. CONTRAST:  24mL OMNIPAQUE IOHEXOL 350 MG/ML SOLN COMPARISON:  Brain MRI performed earlier the same day 06/22/2019, chest radiograph 06/22/2019, PET-CT 05/29/2016, neck CT 08/17/2015 FINDINGS: CT HEAD FINDINGS Brain: Acute/early subacute infarction changes within the right occipital lobe have not significantly changed in extent. Additional foci of acute/early subacute infarction within the medial and posterior right temporal lobe and right thalamus, some of which was better appreciated on same-day brain MRI. Subtle petechial hemorrhage within the right occipital lobe was also better appreciated on this prior exam. No interval infarct is identified. Redemonstrated chronic cortically based infarct within the posterior left frontal lobe. Stable background moderate chronic small vessel ischemic disease and mild generalized parenchymal atrophy. There is no evidence of intracranial mass. No midline shift or extra-axial fluid collection Vascular: Reported below. Skull: Normal. Negative for fracture or focal lesion. Sinuses: Mild ethmoid and maxillary sinus mucosal thickening. No significant mastoid effusion. Orbits: No acute abnormality. Review of the MIP images confirms the above findings CTA NECK FINDINGS Aortic arch: Common origin of the innominate and left common carotid arteries. The visualized aortic arch is unremarkable. No significant innominate or proximal subclavian artery stenosis. Right carotid system: CCA and ICA patent  within the neck without significant stenosis. Mild mixed plaque within the carotid bifurcation Left carotid system: CCA and ICA patent within the neck  without significant stenosis. Mild soft plaque within the CCA. Prominent tortuosity of the proximal to mid ICA. Vertebral arteries: The vertebral arteries are patent within the neck bilaterally. Soft plaque results in moderate/severe stenosis at the origin of the left vertebral artery. Skeleton: No acute bony abnormality or aggressive osseous lesion. Other neck: Ill ill-defined soft tissue prominence with irregular enhancement and regions of internal hypodensity within the left neck, closely related to the left sternocleidomastoid muscle. This region is incompletely assessed on this arterial phase scan, but measures approximately 3.9 x 1.9 x 5.4 cm (AP x TV x CC) (for instance as seen on series 4, image 160 and series 5, image 161). Findings are highly suspicious for a residual/recurrent tumor deposit or nodal conglomerate. Surrounding soft tissue infiltration is nonspecific but may reflect treatment related change. Upper chest: There are multifocal ill-defined and nodular opacities within the imaged lung apices which are likely infectious in etiology. Septic emboli cannot be excluded. Review of the MIP images confirms the above findings CTA HEAD FINDINGS Anterior circulation: The intracranial right internal carotid artery is patent without significant stenosis. The intracranial left internal carotid artery is patent. Mild mixed plaque within the paraclinoid segment with mild stenosis at this site. The M1 middle cerebral arteries are patent without significant stenosis. No M2 proximal branch occlusion is identified. Atherosclerotic irregularity of the M2 and more distal MCA branch vessels bilaterally. Most notably, there are foci of moderate/severe stenosis within a proximal M2 right MCA branch vessel (series 6, image 17). Hypoplastic A1 left ACA. The anterior  cerebral arteries are patent without high-grade proximal stenosis. There is a bulbous appearance of the A-comm complex measuring 3.5 cm (series 4, image 246) (series 5, image 80). Posterior circulation: The intracranial vertebral arteries are patent bilaterally. Moderate/severe focal atherosclerotic narrowing of the V4 segment on the right. Immediately adjacent to this stenosis, there is a 1-2 mm rounded focus of hyperdensity along the V4 segment (series 5, images 130 and 131). This may reflect a tiny aneurysm or eccentric focus of calcified plaque. The basilar artery is patent without significant stenosis. Suspected fetal origin right posterior cerebral artery. The right posterior communicating artery becomes occluded at its distal aspect (series 7, images 97 and 98). No definite distal reconstitution of the right posterior cerebral artery is identified. There is a fetal origin left posterior cerebral artery, which is patent. There is a high-grade focal stenosis within the left posterior communicating artery (series 7, image 121). Venous sinuses: Within limitations of contrast timing, no convincing thrombus. Anatomic variants: As described Review of the MIP images confirms the above findings IMPRESSION: CT head: 1. Acute/early subacute infarction changes within the right occipital and temporal lobes, as well as right thalamus, have not significantly changed in extent as compared to examinations performed earlier the same day. 2. No interval intracranial abnormality is identified. 3. Stable background moderate chronic small vessel ischemic disease and mild generalized parenchymal atrophy. CTA neck: 1. The bilateral common and internal carotid arteries are patent within the neck without significant stenosis. 2. The vertebral arteries are codominant and patent within the neck bilaterally. Moderate/severe atherosclerotic narrowing at the origin of the left vertebral artery. 3. Multifocal ill-defined and nodular  opacities within the periphery of the imaged lung apices which are likely infectious in etiology. Septic emboli cannot be excluded. 4. Ill-defined soft tissue with irregular enhancement and regions of internal hypodensity within the left neck measuring 3.9 x 1.9 x 5.4 cm. Findings are incompletely assessed on this arterial phase scan,  but suspicious for a residual/recurrent tumor deposit or nodal conglomerate. Non-emergent contrast-enhanced neck CT is recommended for further evaluation. CTA head: 1. Probable fetal origin right posterior cerebral artery. The right posterior communicating artery becomes occluded at its distal aspect. No definite reconstitution of the right posterior cerebral artery is identified more distally. 2. Fetal origin left posterior cerebral artery with high-grade focal stenosis within the left posterior communicating artery. 3. Focal high-grade stenosis within the V4 right vertebral artery. 4. Atherosclerotic irregularity of the M2 and more distal MCA branch vessels bilaterally. Most notably there are multifocal moderate/severe stenoses within a proximal M2 right MCA branch. 5. Bulbous anterior communicating artery complex measuring up to 3.5 mm suspicious for aneurysm. Catheter-based angiography is recommended for further evaluation, as clinically warranted. 6. 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 This too could be further assessed at time of catheter based angiography. Electronically Signed: By: Kellie Simmering DO On: 06/22/2019 10:46   CT Head Wo Contrast  Result Date: 06/22/2019 CLINICAL DATA:  Left extremity weakness. Left-sided numbness. EXAM: CT HEAD WITHOUT CONTRAST TECHNIQUE: Contiguous axial images were obtained from the base of the skull through the vertex without intravenous contrast. COMPARISON:  None. FINDINGS: Brain: Focal hypoattenuation and loss of gray-white differentiation is noted in the right occipital lobe. No hemorrhage  or mass lesion is present. Moderate diffuse white matter disease is present bilaterally. The right insular cortex and basal ganglia are intact. Remote left posterior frontal lobe infarct is present. The ventricles are of normal size. No significant extraaxial fluid collection is present. Vascular: Atherosclerotic calcifications are present within the cavernous internal carotid arteries bilaterally as well as in the right vertebral artery. No hyperdense vessel is present. Skull: Calvarium is intact. No focal lytic or blastic lesions are present. Sinuses/Orbits: Mild mucosal thickening present in maxillary sinuses and ethmoid air cells bilaterally. No fluid levels are present. The globes and orbits are within normal limits. IMPRESSION: 1. Focal hypoattenuation and loss of gray-white differentiation in the right occipital lobe is concerning for acute/subacute nonhemorrhagic infarct. 2. Remote left posterior frontal lobe infarct. 3. Moderate diffuse white matter disease. This likely reflects the sequela of chronic microvascular ischemia. Acute white matter ischemia is not excluded. These results were called by telephone at the time of interpretation on 06/22/2019 at 5:47 am to provider [facility] , who verbally acknowledged these results. Electronically Signed   By: San Morelle M.D.   On: 06/22/2019 05:47   CT ANGIO NECK W OR WO CONTRAST  Addendum Date: 06/22/2019   ADDENDUM REPORT: 06/22/2019 10:54 ADDENDUM: These results were called by telephone at the time of interpretation on 06/22/2019 at 10:54 am to provider LESLIE REYNOLDS , who verbally acknowledged these results. Electronically Signed   By: Kellie Simmering DO   On: 06/22/2019 10:54   Result Date: 06/22/2019 CLINICAL DATA:  Stroke, follow-up. EXAM: CT ANGIOGRAPHY HEAD AND NECK TECHNIQUE: Multidetector CT imaging of the head and neck was performed using the standard protocol during bolus administration of intravenous contrast. Multiplanar CT  image reconstructions and MIPs were obtained to evaluate the vascular anatomy. Carotid stenosis measurements (when applicable) are obtained utilizing NASCET criteria, using the distal internal carotid diameter as the denominator. CONTRAST:  23mL OMNIPAQUE IOHEXOL 350 MG/ML SOLN COMPARISON:  Brain MRI performed earlier the same day 06/22/2019, chest radiograph 06/22/2019, PET-CT 05/29/2016, neck CT 08/17/2015 FINDINGS: CT HEAD FINDINGS Brain: Acute/early subacute infarction changes within the right occipital lobe have not significantly changed  in extent. Additional foci of acute/early subacute infarction within the medial and posterior right temporal lobe and right thalamus, some of which was better appreciated on same-day brain MRI. Subtle petechial hemorrhage within the right occipital lobe was also better appreciated on this prior exam. No interval infarct is identified. Redemonstrated chronic cortically based infarct within the posterior left frontal lobe. Stable background moderate chronic small vessel ischemic disease and mild generalized parenchymal atrophy. There is no evidence of intracranial mass. No midline shift or extra-axial fluid collection Vascular: Reported below. Skull: Normal. Negative for fracture or focal lesion. Sinuses: Mild ethmoid and maxillary sinus mucosal thickening. No significant mastoid effusion. Orbits: No acute abnormality. Review of the MIP images confirms the above findings CTA NECK FINDINGS Aortic arch: Common origin of the innominate and left common carotid arteries. The visualized aortic arch is unremarkable. No significant innominate or proximal subclavian artery stenosis. Right carotid system: CCA and ICA patent within the neck without significant stenosis. Mild mixed plaque within the carotid bifurcation Left carotid system: CCA and ICA patent within the neck without significant stenosis. Mild soft plaque within the CCA. Prominent tortuosity of the proximal to mid ICA.  Vertebral arteries: The vertebral arteries are patent within the neck bilaterally. Soft plaque results in moderate/severe stenosis at the origin of the left vertebral artery. Skeleton: No acute bony abnormality or aggressive osseous lesion. Other neck: Ill ill-defined soft tissue prominence with irregular enhancement and regions of internal hypodensity within the left neck, closely related to the left sternocleidomastoid muscle. This region is incompletely assessed on this arterial phase scan, but measures approximately 3.9 x 1.9 x 5.4 cm (AP x TV x CC) (for instance as seen on series 4, image 160 and series 5, image 161). Findings are highly suspicious for a residual/recurrent tumor deposit or nodal conglomerate. Surrounding soft tissue infiltration is nonspecific but may reflect treatment related change. Upper chest: There are multifocal ill-defined and nodular opacities within the imaged lung apices which are likely infectious in etiology. Septic emboli cannot be excluded. Review of the MIP images confirms the above findings CTA HEAD FINDINGS Anterior circulation: The intracranial right internal carotid artery is patent without significant stenosis. The intracranial left internal carotid artery is patent. Mild mixed plaque within the paraclinoid segment with mild stenosis at this site. The M1 middle cerebral arteries are patent without significant stenosis. No M2 proximal branch occlusion is identified. Atherosclerotic irregularity of the M2 and more distal MCA branch vessels bilaterally. Most notably, there are foci of moderate/severe stenosis within a proximal M2 right MCA branch vessel (series 6, image 17). Hypoplastic A1 left ACA. The anterior cerebral arteries are patent without high-grade proximal stenosis. There is a bulbous appearance of the A-comm complex measuring 3.5 cm (series 4, image 246) (series 5, image 80). Posterior circulation: The intracranial vertebral arteries are patent bilaterally.  Moderate/severe focal atherosclerotic narrowing of the V4 segment on the right. Immediately adjacent to this stenosis, there is a 1-2 mm rounded focus of hyperdensity along the V4 segment (series 5, images 130 and 131). This may reflect a tiny aneurysm or eccentric focus of calcified plaque. The basilar artery is patent without significant stenosis. Suspected fetal origin right posterior cerebral artery. The right posterior communicating artery becomes occluded at its distal aspect (series 7, images 97 and 98). No definite distal reconstitution of the right posterior cerebral artery is identified. There is a fetal origin left posterior cerebral artery, which is patent. There is a high-grade focal stenosis within the left posterior  communicating artery (series 7, image 121). Venous sinuses: Within limitations of contrast timing, no convincing thrombus. Anatomic variants: As described Review of the MIP images confirms the above findings IMPRESSION: CT head: 1. Acute/early subacute infarction changes within the right occipital and temporal lobes, as well as right thalamus, have not significantly changed in extent as compared to examinations performed earlier the same day. 2. No interval intracranial abnormality is identified. 3. Stable background moderate chronic small vessel ischemic disease and mild generalized parenchymal atrophy. CTA neck: 1. The bilateral common and internal carotid arteries are patent within the neck without significant stenosis. 2. The vertebral arteries are codominant and patent within the neck bilaterally. Moderate/severe atherosclerotic narrowing at the origin of the left vertebral artery. 3. Multifocal ill-defined and nodular opacities within the periphery of the imaged lung apices which are likely infectious in etiology. Septic emboli cannot be excluded. 4. Ill-defined soft tissue with irregular enhancement and regions of internal hypodensity within the left neck measuring 3.9 x 1.9 x 5.4  cm. Findings are incompletely assessed on this arterial phase scan, but suspicious for a residual/recurrent tumor deposit or nodal conglomerate. Non-emergent contrast-enhanced neck CT is recommended for further evaluation. CTA head: 1. Probable fetal origin right posterior cerebral artery. The right posterior communicating artery becomes occluded at its distal aspect. No definite reconstitution of the right posterior cerebral artery is identified more distally. 2. Fetal origin left posterior cerebral artery with high-grade focal stenosis within the left posterior communicating artery. 3. Focal high-grade stenosis within the V4 right vertebral artery. 4. Atherosclerotic irregularity of the M2 and more distal MCA branch vessels bilaterally. Most notably there are multifocal moderate/severe stenoses within a proximal M2 right MCA branch. 5. Bulbous anterior communicating artery complex measuring up to 3.5 mm suspicious for aneurysm. Catheter-based angiography is recommended for further evaluation, as clinically warranted. 6. 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 This too could be further assessed at time of catheter based angiography. Electronically Signed: By: Kellie Simmering DO On: 06/22/2019 10:46   MR BRAIN WO CONTRAST  Result Date: 06/22/2019 CLINICAL DATA:  Focal neuro deficit, greater than 6 hours, stroke suspected. Additional history provided: Patient presents to emergency department from home with dizziness and weakness for the past 3-4 days. EXAM: MRI HEAD WITHOUT CONTRAST TECHNIQUE: Multiplanar, multiecho pulse sequences of the brain and surrounding structures were obtained without intravenous contrast. COMPARISON:  Noncontrast head CT performed earlier the same day 06/22/2019. FINDINGS: Brain: The examination is intermittently motion degraded. Most notably there is severe motion degradation of the sagittal T1 weighted sequence, moderate motion degradation of  the axial T2 weighted sequence and moderate motion degradation of the axial T2/FLAIR sequence. There is patchy cortical/subcortical restricted diffusion within the right PCA territory involving the right occipital lobe and to a lesser degree the medial and posterior right temporal lobe. Findings are consistent with acute/early subacute infarction. The largest region of infarction within the right occipital lobe measures 3.6 cm. Additional acute/early subacute infarcts within the right thalamus. Corresponding T2/FLAIR hyperintensity at these sites. Additionally, there is a small amount of gyriform T1 hyperintensity within the right occipital lobe consistent likely reflecting petechial hemorrhage. Redemonstrated small remote cortically based infarct within the posterior left frontal lobe. Moderate patchy T2/FLAIR hyperintensity within the cerebral white matter is nonspecific, but consistent with chronic small vessel ischemic disease. Mild generalized parenchymal atrophy. There is no evidence of intracranial mass. No midline shift or extra-axial fluid collection. No chronic intracranial blood  products. Vascular: No definite loss of expected flow voids within the proximal large arterial vessels. Skull and upper cervical spine: No focal marrow lesion is identified within the limitations motion degraded imaging. Sinuses/Orbits: Visualized orbits demonstrate no acute abnormality. Paranasal sinus mucosal thickening greatest within the bilateral ethmoid air cells. No significant mastoid effusion IMPRESSION: 1. Motion degraded examination as described. 2. Multifocal changes of acute/early subacute infarction within the right PCA territory involving the right occipital and temporal lobes. Small amount of associated petechial hemorrhage within the right occipital lobe. Acute/early subacute infarcts are also present within the right thalamus. 3. 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 4.  Moderate chronic small vessel ischemic disease. 5. Mild generalized parenchymal atrophy. 6. Paranasal sinus mucosal thickening. Electronically Signed   By: Kellie Simmering DO   On: 06/22/2019 08:09   DG Chest Portable 1 View  Result Date: 06/22/2019 CLINICAL DATA:  Hypoxia. COVID positive. EXAM: PORTABLE CHEST 1 VIEW COMPARISON:  One-view chest x-ray 06/19/2019 FINDINGS: Heart size is normal. Patchy peripheral airspace disease is present in the right upper lobe. No significant consolidation is present. No effusions are present. Bony thorax is within normal limits. IMPRESSION: Patchy peripheral airspace disease in the right upper lobe compatible with pneumonia. Electronically Signed   By: San Morelle M.D.   On: 06/22/2019 06:14        Scheduled Meds: . vitamin C  500 mg Oral Daily  . aspirin EC  81 mg Oral Daily  . atorvastatin  40 mg Oral Daily  . enoxaparin (LOVENOX) injection  40 mg Subcutaneous Q24H  . insulin aspart  0-5 Units Subcutaneous QHS  . insulin aspart  0-9 Units Subcutaneous TID WC  . ipratropium  2 puff Inhalation Q4H  . methylPREDNISolone (SOLU-MEDROL) injection  40 mg Intravenous Q12H  . zinc sulfate  220 mg Oral Daily   Continuous Infusions: . niCARDipine Stopped (06/22/19 4076)  . remdesivir 100 mg in NS 100 mL 100 mg (06/23/19 1135)     LOS: 1 day    Time spent: 35 min    Sidney Ace, MD [facility] Pager [phone]  If 7PM-7AM, please contact night-coverage 06/23/2019, 1:12 PM

## 2019-06-24 ENCOUNTER — Encounter: Payer: Self-pay | Admitting: Internal Medicine

## 2019-06-24 ENCOUNTER — Inpatient Hospital Stay: Payer: Self-pay

## 2019-06-24 LAB — CBC WITH DIFFERENTIAL/PLATELET
Abs Immature Granulocytes: 0.11 10*3/uL — ABNORMAL HIGH (ref 0.00–0.07)
Basophils Absolute: 0 10*3/uL (ref 0.0–0.1)
Basophils Relative: 0 %
Eosinophils Absolute: 0 10*3/uL (ref 0.0–0.5)
Eosinophils Relative: 0 %
HCT: 40.4 % (ref 39.0–52.0)
Hemoglobin: 14.7 g/dL (ref 13.0–17.0)
Immature Granulocytes: 1 %
Lymphocytes Relative: 5 %
Lymphs Abs: 0.7 10*3/uL (ref 0.7–4.0)
MCH: 29.5 pg (ref 26.0–34.0)
MCHC: 36.4 g/dL — ABNORMAL HIGH (ref 30.0–36.0)
MCV: 81.1 fL (ref 80.0–100.0)
Monocytes Absolute: 0.7 10*3/uL (ref 0.1–1.0)
Monocytes Relative: 5 %
Neutro Abs: 13.1 10*3/uL — ABNORMAL HIGH (ref 1.7–7.7)
Neutrophils Relative %: 89 %
Platelets: 408 10*3/uL — ABNORMAL HIGH (ref 150–400)
RBC: 4.98 MIL/uL (ref 4.22–5.81)
RDW: 12.9 % (ref 11.5–15.5)
WBC: 14.7 10*3/uL — ABNORMAL HIGH (ref 4.0–10.5)
nRBC: 0 % (ref 0.0–0.2)

## 2019-06-24 LAB — COMPREHENSIVE METABOLIC PANEL
ALT: 25 U/L (ref 0–44)
AST: 27 U/L (ref 15–41)
Albumin: 3.9 g/dL (ref 3.5–5.0)
Alkaline Phosphatase: 79 U/L (ref 38–126)
Anion gap: 11 (ref 5–15)
BUN: 18 mg/dL (ref 8–23)
CO2: 28 mmol/L (ref 22–32)
Calcium: 8.8 mg/dL — ABNORMAL LOW (ref 8.9–10.3)
Chloride: 96 mmol/L — ABNORMAL LOW (ref 98–111)
Creatinine, Ser: 1.03 mg/dL (ref 0.61–1.24)
GFR calc Af Amer: >60 mL/min (ref >60)
GFR calc non Af Amer: >60 mL/min (ref >60)
Glucose, Bld: 185 mg/dL — ABNORMAL HIGH (ref 70–99)
Potassium: 3.5 mmol/L (ref 3.5–5.1)
Sodium: 135 mmol/L (ref 135–145)
Total Bilirubin: 1.2 mg/dL (ref 0.3–1.2)
Total Protein: 6.8 g/dL (ref 6.5–8.1)

## 2019-06-24 LAB — GLUCOSE, CAPILLARY
Glucose-Capillary: 174 mg/dL — ABNORMAL HIGH (ref 70–99)
Glucose-Capillary: 211 mg/dL — ABNORMAL HIGH (ref 70–99)
Glucose-Capillary: 182 mg/dL — ABNORMAL HIGH (ref 70–99)
Glucose-Capillary: 165 mg/dL — ABNORMAL HIGH (ref 70–99)

## 2019-06-24 LAB — C-REACTIVE PROTEIN: CRP: 0.6 mg/dL (ref <1.0)

## 2019-06-24 LAB — FERRITIN: Ferritin: 368 ng/mL — ABNORMAL HIGH (ref 24–336)

## 2019-06-24 LAB — FIBRIN DERIVATIVES D-DIMER (ARMC ONLY): Fibrin derivatives D-dimer (ARMC): 1213.65 ng/mL (FEU) — ABNORMAL HIGH (ref 0.00–499.00)

## 2019-06-24 IMAGING — CT CT NECK W/ CM
3 of 4 series · 13 of 33 positions shown, 16 images · IV contrast (omnipaque)
Comparison: Head and neck CTA [DATE]. PET-CT [DATE]. Neck
CT [DATE].

CLINICAL DATA: Left neck mass on recent CTA. History of squamous
cell carcinoma of the left tongue base.

EXAM:
CT NECK WITH CONTRAST
TECHNIQUE: Multidetector CT imaging of the neck was performed using the
standard protocol following the bolus administration of intravenous
contrast.
CONTRAST:  75mL OMNIPAQUE IOHEXOL 300 MG/ML  SOLN

[Series 5: sag neck · sagittal · 0.44mm/px · 5 of 101 slices shown, 6 images]
[im 34/101  bone]
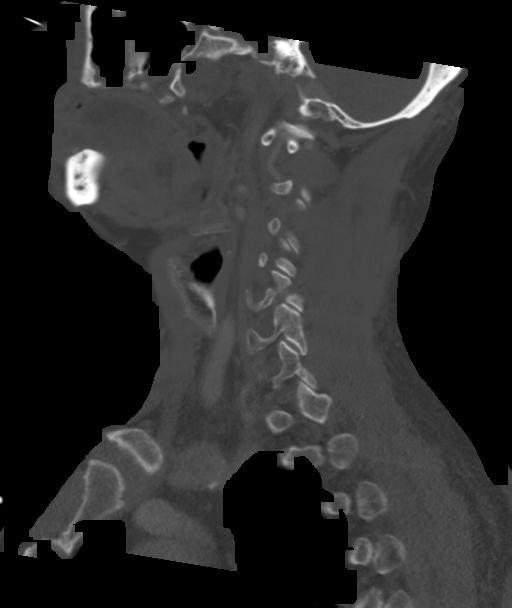
[im 42/101  bone]
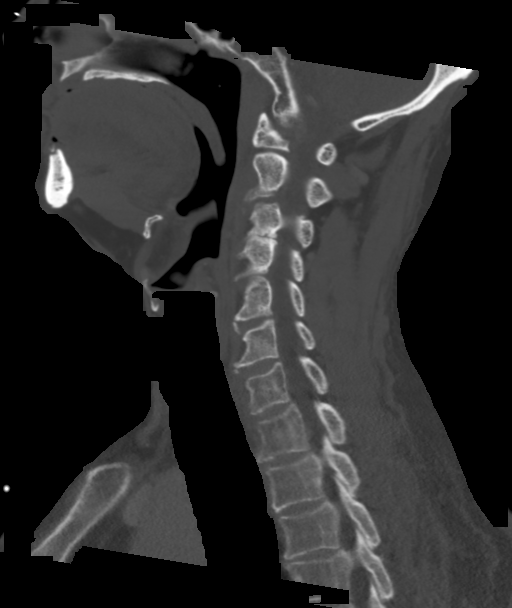
[im 51/101  soft-tissue]
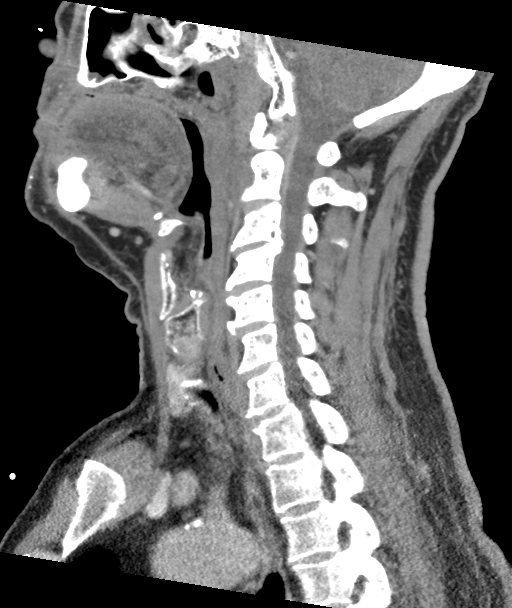
[im 51/101  bone]
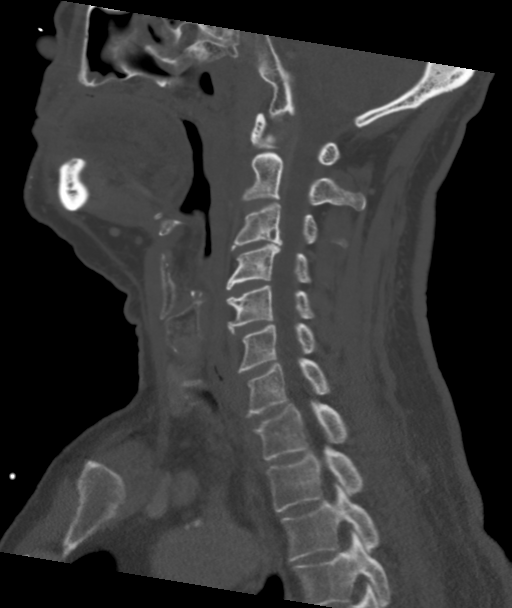
[im 59/101  bone]
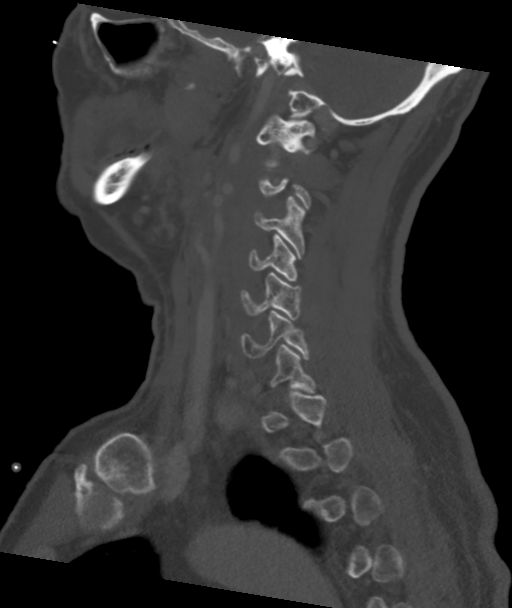
[im 67/101  bone]
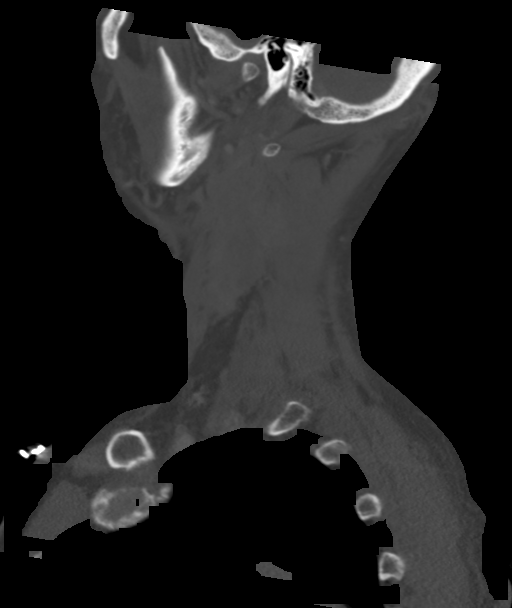

[Series 6: cor neck · coronal · 0.39mm/px · 3 of 113 slices shown]
[im 23/113  bone]
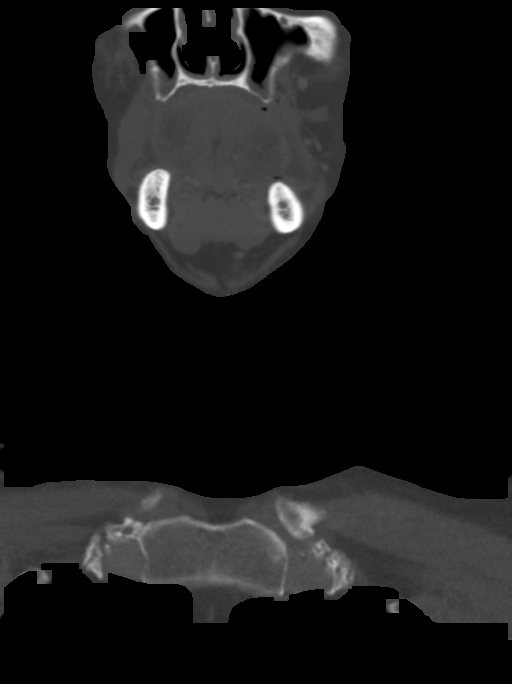
[im 45/113  bone]
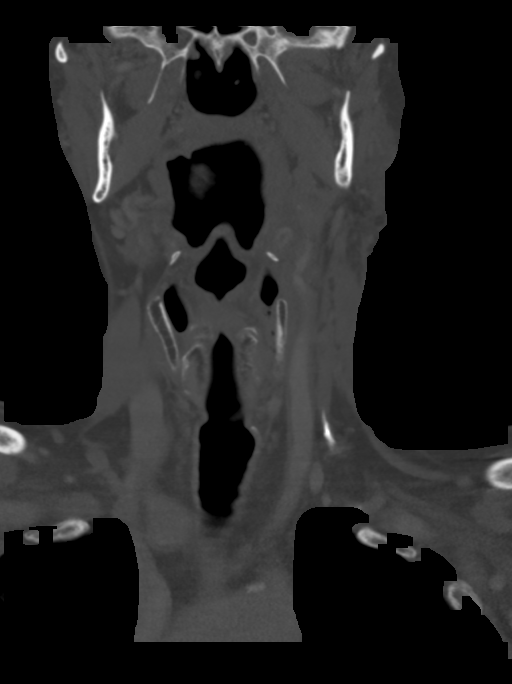
[im 68/113  bone]
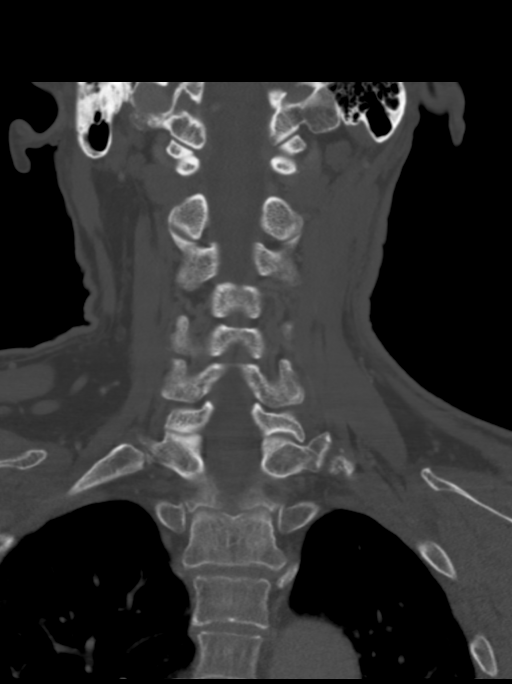

[Series 7: orthogonal ax · axial · 0.39mm/px · z∈[-364,-191]mm · 5 of 134 slices shown, 7 images]
[im 23/134  soft-tissue]
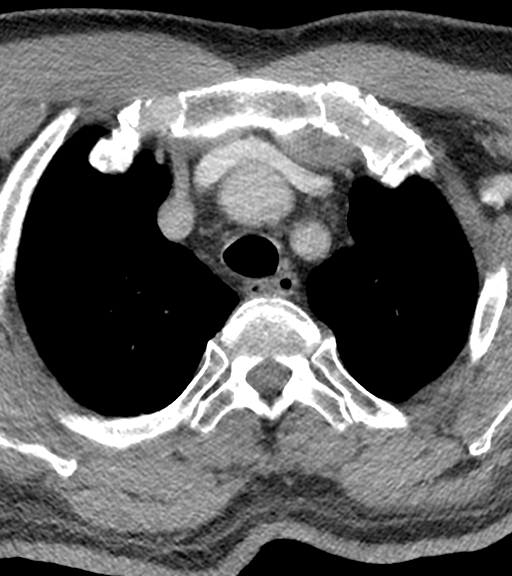
[im 23/134  bone]
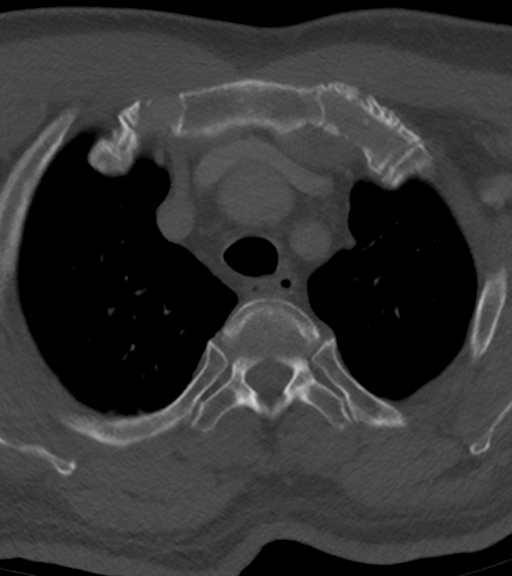
[im 45/134  bone]
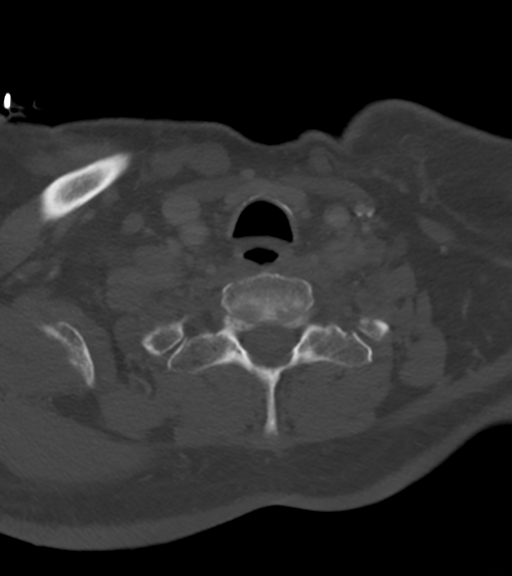
[im 67/134  bone]
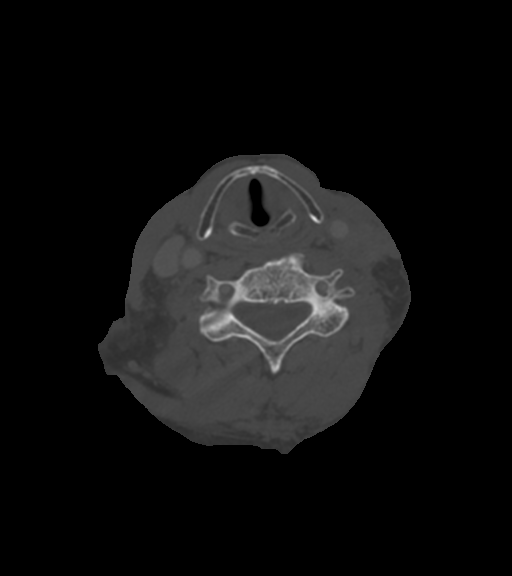
[im 89/134  bone]
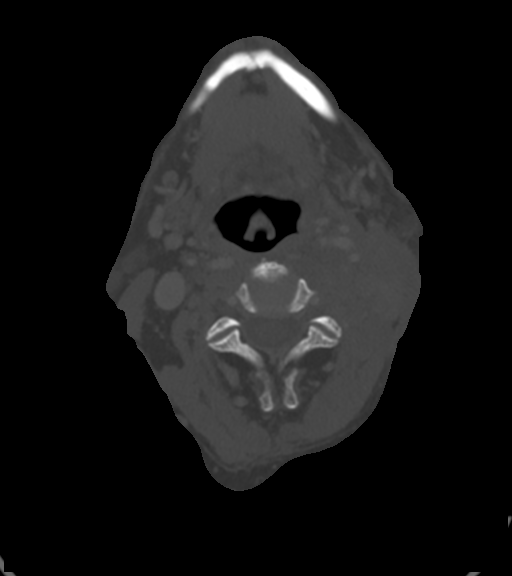
[im 111/134  soft-tissue]
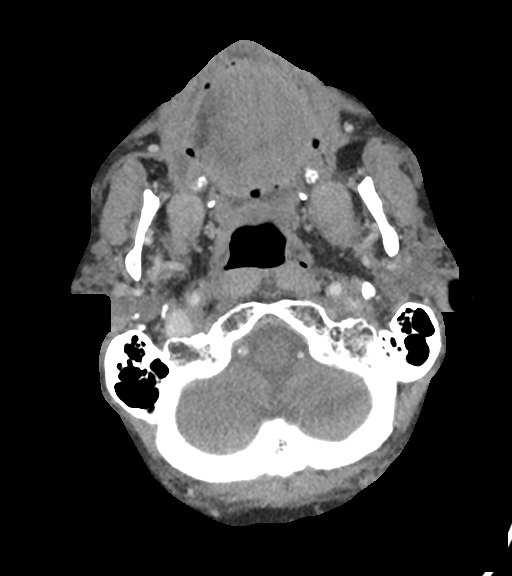
[im 111/134  bone]
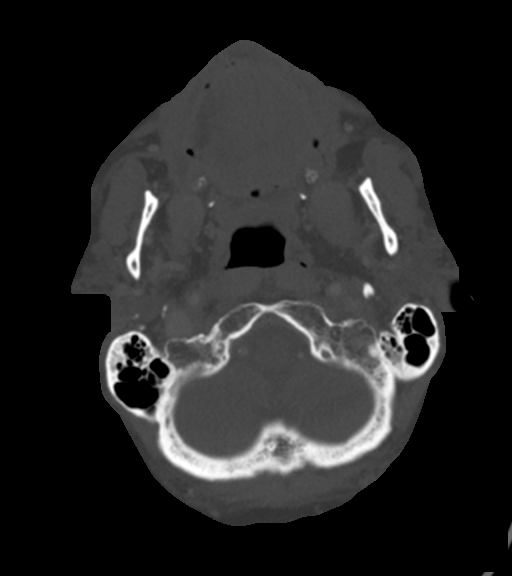

[13 of 33 positions shown; findings below may reference images not displayed]

FINDINGS: Pharynx and larynx: There is mild volume loss in the region of the
left lateral tongue base without a locally recurrent mass. Widely
patent airway with chronic asymmetric enlargement of the right
laryngeal ventricle and right para form sinus.

Salivary glands: Asymmetric left submandibular and left parotid
atrophy. Punctate calculus in the right submandibular gland.

Thyroid: Unremarkable.

Lymph nodes: Soft tissue thickening and fat stranding throughout the
left neck with obscuration of fascial planes consistent with prior
radiation therapy. Heterogeneously enhancing masslike, ill-defined
soft tissue in the left neck in levels II and III partially
inseparable from the sternocleidomastoid and paraspinal musculature
and extending over an area measuring approximately 3.6 x 3.3 x
cm (AP x transverse x craniocaudal), progressed from the [01]
PET-CT. Mild anterior displacement of the left common and internal
carotid arteries with carotid artery encasement by low-density soft
tissue. No evidence of contralateral lymphadenopathy.

Vascular: Encasement and occlusion of the left internal jugular vein
by the left neck mass.

Limited intracranial: Unremarkable.

Visualized orbits: Not imaged.

Mastoids and visualized paranasal sinuses: Mild bilateral maxillary
sinus mucosal thickening. Clear mastoid air cells.

Skeleton: No suspicious osseous lesion. Mild cervical spondylosis.

Upper chest: Patchy peripheral ground-glass opacities in both lung
apices.

Other: None.
IMPRESSION: 1. 7 cm ill-defined left neck mass, increased from [01] and
consistent with recurrent/progressive tumor.
2. Left internal jugular vein occlusion.
3. Patchy ground-glass opacities in both lung apices consistent with
known [01] pneumonia.

## 2019-06-24 MED ORDER — IOHEXOL 300 MG/ML  SOLN
75.0000 mL | Freq: Once | INTRAMUSCULAR | Status: AC | PRN
  Administered 2019-06-24: 75 mL via INTRAVENOUS

## 2019-06-24 NOTE — Progress Notes (Signed)
PT Cancellation Note  Patient Details Name: Kent [name] MRN: [id number] DOB: 1956-02-04   Cancelled Treatment:    Reason Eval/Treat Not Completed: Patient at procedure or test/unavailable(Treatment attempted, pt off unit for procedure per RN. Will attempt again at later date/time as pt is able.)  4:22 PM, 06/24/19 Etta Grandchild, PT, DPT Physical Therapist - [facility]  [phone] ([place])    Jackson C 06/24/2019, 4:22 PM

## 2019-06-24 NOTE — Progress Notes (Signed)
Patient's dedicated CT of the neck reviewed independently concerning for recurrent disease.  Patient will require biopsy and PET scan after discharge and he has recovered from his COVID-19 infection.  Will follow.

## 2019-06-24 NOTE — Progress Notes (Signed)
Occupational Therapy Treatment Patient Details Name: KARMAN VENEY MRN: [id number] DOB: April 04, 1955 Today's Date: 06/24/2019    History of present illness Thadius Smisek Benscoter is an 64 y.o. 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  Patient left the ED, but later returned via [facility] with left extremity weakness. MRI of the brain showed acute/subacute infarcts in the right occipital, right temporal and right thalamic regions.  Some associated petechial hemorrhage noted.  Follow up CTA shows multiple areas of stenosis/occlusion including left vertebral origin moderate to severe, right PCA occlusion, left PCA severe, proximal right M2 moderate to severe.  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   OT comments  Mr Archuleta was seen for OT treatment on this date. Upon arrival to room pt was awake and asking for ice cream and with his breakfast tray untouched in room. Of note, pt BP difficult to assess accurately this session. BP cuff on L calf which pt requires MAX VCs to remain still and during attempted BP reading pt repeatedly removed BP cuff. BP reading on monitor from 10:00 reading 221/112 - pt denies dizziness/lightheadedness, mobility deferred this session. MIN A + SETUP self-feeding/drinking at bed level - multimodal cues for bimanual integration of dominant LUE. SETUP + MAX VCs for facewashing at bed level - tactile cues to initiate using LUE reaching 100% of face, RUE assist for thoroughness. SUP adjust B socks at bed level. Pt instructed in joint protection strategies. Pt verbalized understanding of instruction provided. Pt making good progress toward goals. Pt continues to benefit from skilled OT  services to maximize return to PLOF and minimize risk of future falls, injury, caregiver burden, and readmission. Will continue to follow POC. Discharge recommendation remains appropriate.    Follow Up Recommendations  SNF    Equipment Recommendations  3 in 1 bedside commode    Recommendations for Other Services      Precautions / Restrictions Precautions Precautions: Fall Restrictions Weight Bearing Restrictions: No       Mobility Bed Mobility Overal bed mobility: Needs Assistance             General bed mobility comments: Not addressed 2/2 high BP concerns - pt utilized R hand rail to shift trunk position MOD I at bed level   Transfers                      Balance                                           ADL either performed or assessed with clinical judgement   ADL Overall ADL's : Needs assistance/impaired                                       General ADL Comments: MIN A + SETUP self-feeding/drinking at bed level - assist for VCs for bimanual integration of dominant LUE. SETUP + MAX VCs for facewashing at bed level - tactile cues to initiate using LUE, required RUE use for thoroughness. SUP adjust B socks  at bed level.      Vision       Perception     Praxis      Cognition Arousal/Alertness: Awake/alert Behavior During Therapy: WFL for tasks assessed/performed;Impulsive Overall Cognitive Status: Impaired/Different from baseline Area of Impairment: Problem solving;Following commands;Safety/judgement;Awareness                       Following Commands: Follows one step commands inconsistently;Follows one step commands with increased time Safety/Judgement: Decreased awareness of safety;Decreased awareness of deficits   Problem Solving: Slow processing;Difficulty sequencing;Requires verbal cues;Requires tactile cues General Comments: Pt attempted to remove BP cuff from LLE during reading and required  repeated cueing to keep on t/o reading - pt unable to keep LLE still during reading despite MAX cueing        Exercises Exercises: Other exercises Other Exercises Other Exercises: Pt education re: importance of joint protection during mobility to prevent injuries (difficult to assess pt's ability to carryover education at this time).  Other Exercises: Self-feeding/drinking, adjust socks at bed level   Shoulder Instructions       General Comments BP taken on L calf - unable to assess accuratley 2/2 pt moving and attempting to remove BP cuff. BP on monitor from 10:00 reading 221/122 - pt denies dizziness/lightheadedness. Mobility deferred this session.     Pertinent Vitals/ Pain       Pain Assessment: No/denies pain  Home Living                                          Prior Functioning/Environment              Frequency  Min 2X/week        Progress Toward Goals  OT Goals(current goals can now be found in the care plan section)  Progress towards OT goals: Progressing toward goals  Acute Rehab OT Goals Patient Stated Goal: To go home OT Goal Formulation: With patient Time For Goal Achievement: 07/06/19 Potential to Achieve Goals: Fair ADL Goals Pt Will Perform Grooming: sitting;with adaptive equipment;with supervision;with set-up(c LRAD PRN for safety and improved fxl independence.) Pt Will Transfer to Toilet: ambulating;with min assist;bedside commode(c LRAD PRN for safety and improved fxl independence.) Pt Will Perform Toileting - Clothing Manipulation and hygiene: sit to/from stand;with min guard assist(c LRAD PRN for safety and improved fxl independence.)  Plan Discharge plan remains appropriate;Frequency remains appropriate    Co-evaluation                 AM-PAC OT "6 Clicks" Daily Activity     Outcome Measure   Help from another person eating meals?: A Little Help from another person taking care of personal grooming?: A  Little Help from another person toileting, which includes using toliet, bedpan, or urinal?: A Lot Help from another person bathing (including washing, rinsing, drying)?: A Lot Help from another person to put on and taking off regular upper body clothing?: A Little Help from another person to put on and taking off regular lower body clothing?: A Lot 6 Click Score: 15    End of Session    OT Visit Diagnosis: Other abnormalities of gait and mobility (R26.89);Hemiplegia and hemiparesis Hemiplegia - Right/Left: Left Hemiplegia - dominant/non-dominant: Dominant Hemiplegia - caused by: Cerebral infarction   Activity Tolerance Treatment limited secondary to medical complications (Comment)(High BP limiting mobility )  Patient Left in bed;with call bell/phone within reach;with bed alarm set   Nurse Communication Other (comment)(IV infiltrated and BP )        Time: 1041-1106 OT Time Calculation (min): 25 min  Charges: OT General Charges $OT Visit: 1 Visit OT Treatments $Self Care/Home Management : 23-37 mins  Dessie Coma, M.S. OTR/L  06/24/19, 12:08 PM

## 2019-06-24 NOTE — Progress Notes (Signed)
PROGRESS NOTE    Kent [name]  NIO:[id number] DOB: 10-16-1955 DOA: 06/22/2019 PCP: System, Pcp Not In    Brief Narrative: HPI: Kent [name] is a 64 y.o. 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  Pt had positive Covid19 test 4 days ago. He denies cough or shortness breath, but per RN in ED, pt has mild dry cough. No fever or chills. He states that he has been feeling weak and fatigue since then. He states tha0t he was asleep and when he woke up he feel weaker in left arm. He also states that he is having intermittent weakness in his legs bilaterally. He has dizziness when he stands up and walks. Per report, pt had possible slurry speech.  No vision loss or hearing loss. Pt reports incontinence of bladder and bowels.  No nausea, vomiting, or abdominal pain.  His son states that he had mild loose stool bowel movement at home.  Patient had oxygen desaturation to 86% on room air initially, currently 96% on room air.  Blood pressure 201/141, which improved to 123/93. Cardene gttt is ordered, but nopt started per RN in ED. HR 39 -108, RR21.  Chest x-ray showed right upper lobe patchy infiltration.  Patient is admitted to [place] bed as inpatient.  4/22: Patient seen and examined.  Still very sleepy and lethargic.  Does awaken easily and answers all my questions appropriately.  Neurosurgery consulted for aneurysms noted on CT angio.  Further initial review of imaging no indication for intervention.  Aneurysms appear small.  Recommend follow-up for neck mass noted on CT.  4/23: Patient seen and examined.  Sleeping on my arrival but easily awakened.  Answers questions appropriately.  Patient has been seen by a oncology, neurology, neurosurgery.  Running  hypertensive, permissive in setting of stroke.   Assessment & Plan:   Principal Problem:   Stroke [facility]) Active Problems:   Hypertension   Hyperlipidemia   Hypokalemia   Hypertensive urgency   Acute respiratory failure with hypoxia (HCC)   Diabetes mellitus without complication (HCC)   Chronic diastolic CHF (congestive heart failure) (HCC)   Pneumonia due to COVID-19 virus  Stroke MRI of brain showed multifocal changes of acute/early subacute infarction within the right PCA territory involving the right occipital and temporal lobes.  Small amount of associated petechial hemorrhage within the right occipital lobe.  Acute/early subacute infarcts are also present within the right thalamus.   Neurology, Dr. Doy Mince is consulted. Plan: Echo with bubble, Ef 55%, no WMA, negative bubble will hold oral Bp meds to allow permissive HTN in the setting of acute stroke  continue home ASA (pt was given 324 mg of ASA already) fasting lipid panel: no HLD noted  HbA1c 9.2, poor control of DM RN swallow screen, passed, diet advanced UDS: negative PT and OT consults: rec SNF  Hypertension and Hypertensive urgency  Blood pressure 201/141, which improved to 123/93.  Cardene gtt is ordered, but not started in ED.  -will hold home Bp meds -will give hydralazine for SBP>220 or dCBP>120 to allow permissive hypertension - Plan to restart BP regimen tomorrow  Hyperlipidemia: - Lipitor 40 mg daily  Hypokalemia:  K= 3.2 on admission. - Repleted - Check Mg level: NL  Acute respiratory failure with hypoxia due to [place] due to  COVID-19 virus  initially pt had oxygen desaturation to 86% on room air.   Chest x-ray showed right upper lobe patchy infiltration. Plan: - Remdesivir per pharm (day 3/5) - Solumedrol 40 mg bid (day 3/10) - vitamin C, zinc.  - Bronchodilators - PRN Mucinex for cough - f/u Blood culture - Follow inflammatory markers  Diabetes mellitus without complication  ([place])  Most recent A1c 9.2, poorly controled.  Patient is taking Metformin at home -SSI - Hold home metformin  Chronic diastolic CHF (congestive heart failure) ([place]) 2D echo on 01/09/2016 showed EF of 45-50%.   No leg edema or JVD.   CHF seems to be compensated.  History of SCC head and neck History of GIST Per patients son he has been lost to onc followup since 2017 CT angio neck demonstrates soft tissue mass concerning for tumor recurrence Patients last visit to oncology at [facility] 2017 Will consult oncology for further recommendations  DVT prophylaxis: lovenox Code Status: Partial Family Communication: Patient's son Kent [name] via phone [phone] on 06/23/2019 Disposition Plan: Status is: Inpatient  Status is: Inpatient  Remains inpatient appropriate because:Inpatient level of care appropriate due to severity of illness   Dispo: The patient is from: Home              Anticipated d/c is to: SNF              Anticipated d/c date is: 2 days              Patient currently is not medically stable to d/c.                 Consultants:   Neurology  [place]  Oncology   Procedures:   2D echo with bubble  Antimicrobials:   Remdesivir    Subjective: Seen and examined Lethargic but easily awakened No pain complaints  Objective: Vitals:   06/23/19 1555 06/23/19 1600 06/23/19 1720 06/23/19 2352  BP: (!) 180/126 (!) 172/125 (!) 172/85 (!) 180/100  Pulse:  97 95 (!) 29  Resp:  16 20 19   Temp:  98.1 F (36.7 C)  97.8 F (36.6 C)  TempSrc:  Oral  Oral  SpO2: 95% 98% 97% 98%  Weight:      Height:        Intake/Output Summary (Last 24 hours) at 06/24/2019 1327 Last data filed at 06/24/2019 0810 Gross per 24 hour  Intake 100 ml  Output 525 ml  Net -425 ml   Filed Weights   06/22/19 0523  Weight: 81.6 kg    Examination:  General exam: Appears calm and comfortable  Respiratory system: Clear to auscultation. Respiratory effort  normal. Cardiovascular system: S1 & S2 heard, RRR. No JVD, murmurs, rubs, gallops or clicks. No pedal edema. Gastrointestinal system: Abdomen is nondistended, soft and nontender. No organomegaly or masses felt. Normal bowel sounds heard. Central nervous system: Decreased muscle strength in left arm and left leg.  Alert oriented x3 Extremities: Symmetric 5 x 5 power. Skin: No rashes, lesions or ulcers Psychiatry: Judgement and insight appear normal. Mood & affect appropriate.     Data Reviewed: I have personally reviewed following labs and imaging studies  CBC: Recent Labs  Lab 06/21/19 2203 06/23/19 0405 06/24/19 0513  WBC 6.8 9.1 14.7*  NEUTROABS  --  8.1* 13.1*  HGB 13.8 14.9 14.7  HCT 38.9* 42.3 40.4  MCV 81.9 83.1 81.1  PLT 303 347 035*   Basic Metabolic Panel: Recent Labs  Lab 06/21/19 2203 06/22/19  0600 06/23/19 0405 06/24/19 0513  NA 134* 136 135 135  K 3.7 3.2* 3.6 3.5  CL 100 98 97* 96*  CO2 26 28 24 28   GLUCOSE 153* 178* 155* 185*  BUN 6* 5* 10 18  CREATININE 0.94 0.85 0.99 1.03  CALCIUM 8.7* 8.6* 8.6* 8.8*  MG  --   --  1.6*  --    GFR: Estimated Creatinine Clearance: 74.1 mL/min (by C-G formula based on SCr of 1.03 mg/dL). Liver Function Tests: Recent Labs  Lab 06/22/19 0722 06/23/19 0405 06/24/19 0513  AST 27 23 27   ALT 20 24 25   ALKPHOS 87 87 79  BILITOT 1.0 1.0 1.2  PROT 7.2 7.1 6.8  ALBUMIN 3.9 3.8 3.9   No results for input(s): LIPASE, AMYLASE in the last 168 hours. No results for input(s): AMMONIA in the last 168 hours. Coagulation Profile: Recent Labs  Lab 06/22/19 0600  INR 1.0   Cardiac Enzymes: No results for input(s): CKTOTAL, CKMB, CKMBINDEX, TROPONINI in the last 168 hours. BNP (last 3 results) No results for input(s): PROBNP in the last 8760 hours. HbA1C: Recent Labs    06/23/19 0405  HGBA1C 9.2*   CBG: Recent Labs  Lab 06/23/19 1137 06/23/19 1640 06/23/19 2041 06/24/19 0904 06/24/19 1154  GLUCAP 151* 216*  159* 174* 211*   Lipid Profile: Recent Labs    06/22/19 0510 06/23/19 0405  CHOL  --  88  HDL  --  32*  LDLCALC  --  47  TRIG 80 45  CHOLHDL  --  2.8   Thyroid Function Tests: No results for input(s): TSH, T4TOTAL, FREET4, T3FREE, THYROIDAB in the last 72 hours. Anemia Panel: Recent Labs    06/23/19 0405 06/24/19 0513  FERRITIN 337* 368*   Sepsis Labs: Recent Labs  Lab 06/22/19 0600 06/22/19 0655  PROCALCITON <0.10  --   LATICACIDVEN  --  1.4    Recent Results (from the past 240 hour(s))  Respiratory Panel by RT PCR (Flu A&B, Covid) - Urine, Clean Catch     Status: Abnormal   Collection Time: 06/22/19  5:10 AM   Specimen: Urine, Clean Catch  Result Value Ref Range Status   SARS Coronavirus 2 by RT PCR POSITIVE (A) NEGATIVE Final    Comment: RESULT CALLED TO, READ BACK BY AND VERIFIED WITH: GREG MOYER AT 2725 06/22/19 SDR (NOTE) SARS-CoV-2 target nucleic acids are DETECTED. SARS-CoV-2 RNA is generally detectable in upper respiratory specimens  during the acute phase of infection. Positive results are indicative of the presence of the identified virus, but do not rule out bacterial infection or co-infection with other pathogens not detected by the test. Clinical correlation with patient history and other diagnostic information is necessary to determine patient infection status. The expected result is Negative. Fact Sheet for Patients:  PinkCheek.be Fact Sheet for Healthcare Providers: GravelBags.it This test is not yet approved or cleared by the [place] FDA and  has been authorized for detection and/or diagnosis of SARS-CoV-2 by FDA under an Emergency Use Authorization (EUA).  This EUA will remain in effect (meaning this test can be used) for th e duration of  the COVID-19 declaration under Section 564(b)(1) of the Act, 21 U.S.C. section 360bbb-3(b)(1), unless the authorization is terminated or  revoked sooner.    Influenza A by PCR NEGATIVE NEGATIVE Final   Influenza B by PCR NEGATIVE NEGATIVE Final    Comment: (NOTE) The Xpert Xpress SARS-CoV-2/FLU/RSV assay is intended as an aid in  the diagnosis  of influenza from Nasopharyngeal swab specimens and  should not be used as a sole basis for treatment. Nasal washings and  aspirates are unacceptable for Xpert Xpress SARS-CoV-2/FLU/RSV  testing. Fact Sheet for Patients: PinkCheek.be Fact Sheet for Healthcare Providers: GravelBags.it This test is not yet approved or cleared by the [place] FDA and  has been authorized for detection and/or diagnosis of SARS-CoV-2 by  FDA under an Emergency Use Authorization (EUA). This EUA will remain  in effect (meaning this test can be used) for the duration of the  Covid-19 declaration under Section 564(b)(1) of the Act, 21  U.S.C. section 360bbb-3(b)(1), unless the authorization is  terminated or revoked. Performed at [facility], [address] [place], [place] [address]   Blood culture (routine x 2)     Status: None (Preliminary result)   Collection Time: 06/22/19  6:55 AM   Specimen: BLOOD  Result Value Ref Range Status   Specimen Description BLOOD LEFT [facility]  Final   Special Requests   Final    BOTTLES DRAWN AEROBIC AND ANAEROBIC Blood Culture adequate volume   Culture   Final    NO GROWTH 2 DAYS Performed at [facility], [address] [place], [place] [address]    Report Status PENDING  Incomplete  Blood culture (routine x 2)     Status: None (Preliminary result)   Collection Time: 06/22/19  6:55 AM   Specimen: BLOOD  Result Value Ref Range Status   Specimen Description BLOOD LEFT ARM  Final   Special Requests   Final    BOTTLES DRAWN AEROBIC AND ANAEROBIC Blood Culture adequate volume   Culture   Final    NO GROWTH 2 DAYS Performed at [facility], [address], [place],   [address]    Report Status PENDING  Incomplete         Radiology Studies: ECHOCARDIOGRAM COMPLETE BUBBLE STUDY  Result Date: 06/23/2019    ECHOCARDIOGRAM REPORT   Patient Name:   JANTZEN PILGER Everitt Date of Exam: 06/23/2019 Medical Rec #:  [id number]     Height:       67.0 in Accession #:    [id number]    Weight:       180.0 lb Date of Birth:  14-May-1955     BSA:          1.934 m Patient Age:    89 years      BP:           135/72 mmHg Patient Gender: M             HR:           81 bpm. Exam Location:  [facility] Procedure: 2D Echo, Cardiac Doppler, Color Doppler and Saline Contrast Bubble            Study Indications:     Stroke 434.91  History:         Patient has no prior history of Echocardiogram examinations.                  Stroke; Risk Factors:Hypertension and Diabetes.  Sonographer:     Sherrie Sport RDCS (AE) Referring Phys:  [place] Diagnosing Phys: Ida Rogue MD  Sonographer Comments: Technically difficult study due to poor echo windows. 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 IMPRESSIONS  1. Left ventricular ejection fraction, by estimation, is 55%. The left ventricle has normal function.  The left ventricle has no regional wall motion abnormalities. There is mild left ventricular hypertrophy. Left ventricular diastolic parameters are consistent with Grade I diastolic dysfunction (impaired relaxation).  2. Right ventricular systolic function is normal. The right ventricular size is normal. Tricuspid regurgitation signal is inadequate for assessing PA pressure.  3. Agitated saline contrast bubble study was negative, with no evidence of any interatrial shunt. FINDINGS  Left Ventricle: Left ventricular ejection fraction, by estimation, is 55 to 60%. The left ventricle has normal function. The left ventricle has no regional wall motion abnormalities. The left ventricular internal cavity size was normal in size. There is  mild  left ventricular hypertrophy. Left ventricular diastolic parameters are consistent with Grade I diastolic dysfunction (impaired relaxation). Right Ventricle: The right ventricular size is normal. No increase in right ventricular wall thickness. Right ventricular systolic function is normal. Tricuspid regurgitation signal is inadequate for assessing PA pressure. Left Atrium: Left atrial size was normal in size. Right Atrium: Right atrial size was normal in size. Pericardium: There is no evidence of pericardial effusion. Mitral Valve: The mitral valve is normal in structure. Normal mobility of the mitral valve leaflets. No evidence of mitral valve regurgitation. No evidence of mitral valve stenosis. Tricuspid Valve: The tricuspid valve is not well visualized. Tricuspid valve regurgitation is not demonstrated. No evidence of tricuspid stenosis. Aortic Valve: The aortic valve was not well visualized. Aortic valve regurgitation is not visualized. No aortic stenosis is present. Aortic valve mean gradient measures 2.5 mmHg. Aortic valve peak gradient measures 4.4 mmHg. Aortic valve area, by VTI measures 3.36 cm. Pulmonic Valve: The pulmonic valve was not well visualized. Pulmonic valve regurgitation is not visualized. No evidence of pulmonic stenosis. Aorta: The aortic root is normal in size and structure. Venous: The inferior vena cava is normal in size with greater than 50% respiratory variability, suggesting right atrial pressure of 3 mmHg. IAS/Shunts: No atrial level shunt detected by color flow Doppler. Agitated saline contrast was given intravenously to evaluate for intracardiac shunting. Agitated saline contrast bubble study was negative, with no evidence of any interatrial shunt. There  is no evidence of a patent foramen ovale. There is no evidence of an atrial septal defect.  LEFT VENTRICLE PLAX 2D LVIDd:         5.15 cm LVIDs:         2.91 cm LV PW:         1.40 cm LV IVS:        1.32 cm LVOT diam:     2.20 cm  LV SV:         55 LV SV Index:   29 LVOT Area:     3.80 cm  LEFT ATRIUM         Index LA diam:    3.40 cm 1.76 cm/m  AORTIC VALVE                   PULMONIC VALVE AV Area (Vmax):    2.20 cm    PV Vmax:        0.77 m/s AV Area (Vmean):   2.72 cm    PV Peak grad:   2.3 mmHg AV Area (VTI):     3.36 cm    RVOT Peak grad: 5 mmHg AV Vmax:           104.55 cm/s AV Vmean:          68.350 cm/s AV VTI:  0.165 m AV Peak Grad:      4.4 mmHg AV Mean Grad:      2.5 mmHg LVOT Vmax:         60.60 cm/s LVOT Vmean:        48.900 cm/s LVOT VTI:          0.146 m LVOT/AV VTI ratio: 0.88  AORTA Ao Root diam: 3.60 cm MITRAL VALVE MV Area (PHT): 3.20 cm    SHUNTS MV Decel Time: 237 msec    Systemic VTI:  0.15 m MV E velocity: 57.70 cm/s  Systemic Diam: 2.20 cm MV A velocity: 96.00 cm/s MV E/A ratio:  0.60 Ida Rogue MD Electronically signed by Ida Rogue MD Signature Date/Time: 06/23/2019/2:20:03 PM    Final         Scheduled Meds: . vitamin C  500 mg Oral Daily  . aspirin EC  81 mg Oral Daily  . atorvastatin  40 mg Oral Daily  . enoxaparin (LOVENOX) injection  40 mg Subcutaneous Q24H  . insulin aspart  0-5 Units Subcutaneous QHS  . insulin aspart  0-9 Units Subcutaneous TID WC  . ipratropium  2 puff Inhalation Q4H  . methylPREDNISolone (SOLU-MEDROL) injection  40 mg Intravenous Q12H  . zinc sulfate  220 mg Oral Daily   Continuous Infusions: . remdesivir 100 mg in NS 100 mL 100 mg (06/24/19 1255)     LOS: 2 days    Time spent: 35 min    Sidney Ace, MD [facility] Pager [phone]  If 7PM-7AM, please contact night-coverage 06/24/2019, 1:27 PM

## 2019-06-25 LAB — COMPREHENSIVE METABOLIC PANEL
ALT: 25 U/L (ref 0–44)
AST: 37 U/L (ref 15–41)
Albumin: 3.8 g/dL (ref 3.5–5.0)
Alkaline Phosphatase: 78 U/L (ref 38–126)
Anion gap: 11 (ref 5–15)
BUN: 23 mg/dL (ref 8–23)
CO2: 27 mmol/L (ref 22–32)
Calcium: 8.7 mg/dL — ABNORMAL LOW (ref 8.9–10.3)
Chloride: 95 mmol/L — ABNORMAL LOW (ref 98–111)
Creatinine, Ser: 1.13 mg/dL (ref 0.61–1.24)
GFR calc Af Amer: >60 mL/min (ref >60)
GFR calc non Af Amer: >60 mL/min (ref >60)
Glucose, Bld: 173 mg/dL — ABNORMAL HIGH (ref 70–99)
Potassium: 3.9 mmol/L (ref 3.5–5.1)
Sodium: 133 mmol/L — ABNORMAL LOW (ref 135–145)
Total Bilirubin: 1.1 mg/dL (ref 0.3–1.2)
Total Protein: 6.7 g/dL (ref 6.5–8.1)

## 2019-06-25 LAB — GLUCOSE, CAPILLARY
Glucose-Capillary: 147 mg/dL — ABNORMAL HIGH (ref 70–99)
Glucose-Capillary: 159 mg/dL — ABNORMAL HIGH (ref 70–99)
Glucose-Capillary: 191 mg/dL — ABNORMAL HIGH (ref 70–99)
Glucose-Capillary: 136 mg/dL — ABNORMAL HIGH (ref 70–99)

## 2019-06-25 LAB — CBC WITH DIFFERENTIAL/PLATELET
Abs Immature Granulocytes: 0.12 10*3/uL — ABNORMAL HIGH (ref 0.00–0.07)
Basophils Absolute: 0 10*3/uL (ref 0.0–0.1)
Basophils Relative: 0 %
Eosinophils Absolute: 0 10*3/uL (ref 0.0–0.5)
Eosinophils Relative: 0 %
HCT: 41.3 % (ref 39.0–52.0)
Hemoglobin: 14.6 g/dL (ref 13.0–17.0)
Immature Granulocytes: 1 %
Lymphocytes Relative: 5 %
Lymphs Abs: 0.8 10*3/uL (ref 0.7–4.0)
MCH: 29.3 pg (ref 26.0–34.0)
MCHC: 35.4 g/dL (ref 30.0–36.0)
MCV: 82.8 fL (ref 80.0–100.0)
Monocytes Absolute: 1.1 10*3/uL — ABNORMAL HIGH (ref 0.1–1.0)
Monocytes Relative: 8 %
Neutro Abs: 13 10*3/uL — ABNORMAL HIGH (ref 1.7–7.7)
Neutrophils Relative %: 86 %
Platelets: 383 10*3/uL (ref 150–400)
RBC: 4.99 MIL/uL (ref 4.22–5.81)
RDW: 13 % (ref 11.5–15.5)
WBC: 15.1 10*3/uL — ABNORMAL HIGH (ref 4.0–10.5)
nRBC: 0 % (ref 0.0–0.2)

## 2019-06-25 LAB — FERRITIN: Ferritin: 337 ng/mL — ABNORMAL HIGH (ref 24–336)

## 2019-06-25 LAB — C-REACTIVE PROTEIN: CRP: 0.6 mg/dL (ref <1.0)

## 2019-06-25 LAB — FIBRIN DERIVATIVES D-DIMER (ARMC ONLY): Fibrin derivatives D-dimer (ARMC): 847.33 ng/mL (FEU) — ABNORMAL HIGH (ref 0.00–499.00)

## 2019-06-25 MED ORDER — LOSARTAN POTASSIUM 50 MG PO TABS
50.0000 mg | ORAL_TABLET | Freq: Every day | ORAL | Status: DC
  Administered 2019-06-25 – 2019-07-05 (×11): 50 mg via ORAL
  Filled 2019-06-25 (×11): qty 1

## 2019-06-25 NOTE — Progress Notes (Signed)
PROGRESS NOTE    Karis Emig Kauth  OBS:[id number] DOB: [birth year] DOA: 06/22/2019 PCP: System, Pcp Not In    Brief Narrative: HPI: Brittian Renaldo Wissmann is a 64 y.o. 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  Pt had positive Covid19 test 4 days ago. He denies cough or shortness breath, but per RN in ED, pt has mild dry cough. No fever or chills. He states that he has been feeling weak and fatigue since then. He states tha0t he was asleep and when he woke up he feel weaker in left arm. He also states that he is having intermittent weakness in his legs bilaterally. He has dizziness when he stands up and walks. Per report, pt had possible slurry speech.  No vision loss or hearing loss. Pt reports incontinence of bladder and bowels.  No nausea, vomiting, or abdominal pain.  His son states that he had mild loose stool bowel movement at home.  Patient had oxygen desaturation to 86% on room air initially, currently 96% on room air.  Blood pressure 201/141, which improved to 123/93. Cardene gttt is ordered, but nopt started per RN in ED. HR 39 -108, RR21.  Chest x-ray showed right upper lobe patchy infiltration.  Patient is admitted to [place] bed as inpatient.  4/22: Patient seen and examined.  Still very sleepy and lethargic.  Does awaken easily and answers all my questions appropriately.  Neurosurgery consulted for aneurysms noted on CT angio.  Further initial review of imaging no indication for intervention.  Aneurysms appear small.  Recommend follow-up for neck mass noted on CT.  4/23: Patient seen and examined.  Sleeping on my arrival but easily awakened.  Answers questions appropriately.  Patient has been seen by a oncology, neurology, neurosurgery.  Running  hypertensive, permissive in setting of stroke.  4/24: Patient seen and examined.  Easily awakened.  Continues to endorse fatigue.  Answers all questions appropriately.  Blood pressure improved over interval.   Assessment & Plan:   Principal Problem:   Stroke [facility]) Active Problems:   Hypertension   Hyperlipidemia   Hypokalemia   Hypertensive urgency   Acute respiratory failure with hypoxia (HCC)   Diabetes mellitus without complication (HCC)   Chronic diastolic CHF (congestive heart failure) (HCC)   Pneumonia due to COVID-19 virus  Stroke MRI of brain showed multifocal changes of acute/early subacute infarction within the right PCA territory involving the right occipital and temporal lobes.  Small amount of associated petechial hemorrhage within the right occipital lobe.  Acute/early subacute infarcts are also present within the right thalamus.   Neurology, Dr. Doy Mince is consulted. Plan: Echo with bubble, Ef 55%, no WMA, negative bubble Permissive hypertension for 48 hours. continue home ASA (pt was given 324 mg of ASA already) fasting lipid panel: no HLD noted  HbA1c 9.2, poor control of DM RN swallow screen, passed, diet advanced UDS: negative PT and OT consults: rec SNF  Hypertension and Hypertensive urgency Hypertensive urgency resolved Permissive hypertension allowed for 48 hours Restart home losartan Continue as needed IV hydralazine  Hyperlipidemia: - Lipitor 40 mg daily  Hypokalemia:  K= 3.2 on admission. - Repleted - Check Mg level: NL  Acute respiratory failure with hypoxia due to [place] due to COVID-19 virus  initially pt had oxygen desaturation to 86% on  room air.   Chest x-ray showed right upper lobe patchy infiltration. Plan: - Remdesivir per pharm (day 4/5) - Solumedrol 40 mg bid (day 4/10) - vitamin C, zinc.  - Bronchodilators - PRN Mucinex for cough - f/u Blood culture - Follow inflammatory markers  Diabetes mellitus without  complication ([place])  Most recent A1c 9.2, poorly controled.  Patient is taking Metformin at home -SSI - Hold home metformin  Chronic diastolic CHF (congestive heart failure) ([place]) 2D echo on 01/09/2016 showed EF of 45-50%.   No leg edema or JVD.   CHF seems to be compensated.  History of SCC head and neck History of GIST Per patients son he has been lost to onc followup since 2017 CT angio neck demonstrates soft tissue mass concerning for tumor recurrence Patients last visit to oncology at [facility] 2017 Oncology reviewed CAT scan Concerning for recurrence Patient will need outpatient PET scan  DVT prophylaxis: lovenox Code Status: Partial Family Communication: Patient's son Clyde Zarrella via phone [phone] on 06/23/2019 Disposition Plan: Status is: Inpatient  Status is: Inpatient  Remains inpatient appropriate because:Inpatient level of care appropriate due to severity of illness   Dispo: The patient is from: Home              Anticipated d/c is to: SNF              Anticipated d/c date is: 2 days              Patient currently is not medically stable to d/c.   Consultants:   Neurology  NSG  Oncology   Procedures:   2D echo with bubble  Antimicrobials:   Remdesivir    Subjective: Seen and examined Lethargic but easily awakened No pain complaints  Objective: Vitals:   06/24/19 2034 06/24/19 2349 06/25/19 0401 06/25/19 0812  BP: (!) 139/100 (!) 155/105 116/79 (!) 134/93  Pulse: 84 89 82 69  Resp: 18 18 18 20   Temp:    98.3 F (36.8 C)  TempSrc:    Oral  SpO2: 97% 95% 97% 97%  Weight:      Height:        Intake/Output Summary (Last 24 hours) at 06/25/2019 1359 Last data filed at 06/24/2019 2351 Gross per 24 hour  Intake --  Output 425 ml  Net -425 ml   Filed Weights   06/22/19 0523  Weight: 81.6 kg    Examination:  General exam: Appears calm and comfortable  Respiratory system: Clear to auscultation. Respiratory effort  normal. Cardiovascular system: S1 & S2 heard, RRR. No JVD, murmurs, rubs, gallops or clicks. No pedal edema. Gastrointestinal system: Abdomen is nondistended, soft and nontender. No organomegaly or masses felt. Normal bowel sounds heard. Central nervous system: Decreased muscle strength in left arm and left leg.  Alert oriented x3 Extremities: Symmetric 5 x 5 power. Skin: No rashes, lesions or ulcers Psychiatry: Judgement and insight appear normal. Mood & affect appropriate.     Data Reviewed: I have personally reviewed following labs and imaging studies  CBC: Recent Labs  Lab 06/21/19 2203 06/23/19 0405 06/24/19 0513 06/25/19 0654  WBC 6.8 9.1 14.7* 15.1*  NEUTROABS  --  8.1* 13.1* 13.0*  HGB 13.8 14.9 14.7 14.6  HCT 38.9* 42.3 40.4 41.3  MCV 81.9 83.1 81.1 82.8  PLT 303 347 408* 175   Basic Metabolic Panel: Recent Labs  Lab 06/21/19 2203 06/22/19 0600 06/23/19 0405 06/24/19 0513 06/25/19 0654  NA 134* 136 135 135 133*  K  3.7 3.2* 3.6 3.5 3.9  CL 100 98 97* 96* 95*  CO2 26 28 24 28 27   GLUCOSE 153* 178* 155* 185* 173*  BUN 6* 5* 10 18 23   CREATININE 0.94 0.85 0.99 1.03 1.13  CALCIUM 8.7* 8.6* 8.6* 8.8* 8.7*  MG  --   --  1.6*  --   --    GFR: Estimated Creatinine Clearance: 67.5 mL/min (by C-G formula based on SCr of 1.13 mg/dL). Liver Function Tests: Recent Labs  Lab 06/22/19 0722 06/23/19 0405 06/24/19 0513 06/25/19 0654  AST 27 23 27  37  ALT 20 24 25 25   ALKPHOS 87 87 79 78  BILITOT 1.0 1.0 1.2 1.1  PROT 7.2 7.1 6.8 6.7  ALBUMIN 3.9 3.8 3.9 3.8   No results for input(s): LIPASE, AMYLASE in the last 168 hours. No results for input(s): AMMONIA in the last 168 hours. Coagulation Profile: Recent Labs  Lab 06/22/19 0600  INR 1.0   Cardiac Enzymes: No results for input(s): CKTOTAL, CKMB, CKMBINDEX, TROPONINI in the last 168 hours. BNP (last 3 results) No results for input(s): PROBNP in the last 8760 hours. HbA1C: Recent Labs    06/23/19 0405   HGBA1C 9.2*   CBG: Recent Labs  Lab 06/24/19 1154 06/24/19 1735 06/24/19 2046 06/25/19 0810 06/25/19 1155  GLUCAP 211* 182* 165* 147* 159*   Lipid Profile: Recent Labs    06/23/19 0405  CHOL 88  HDL 32*  LDLCALC 47  TRIG 45  CHOLHDL 2.8   Thyroid Function Tests: No results for input(s): TSH, T4TOTAL, FREET4, T3FREE, THYROIDAB in the last 72 hours. Anemia Panel: Recent Labs    06/24/19 0513 06/25/19 0654  FERRITIN 368* 337*   Sepsis Labs: Recent Labs  Lab 06/22/19 0600 06/22/19 0655  PROCALCITON <0.10  --   LATICACIDVEN  --  1.4    Recent Results (from the past 240 hour(s))  Respiratory Panel by RT PCR (Flu A&B, Covid) - Urine, Clean Catch     Status: Abnormal   Collection Time: 06/22/19  5:10 AM   Specimen: Urine, Clean Catch  Result Value Ref Range Status   SARS Coronavirus 2 by RT PCR POSITIVE (A) NEGATIVE Final    Comment: RESULT CALLED TO, READ BACK BY AND VERIFIED WITH: GREG MOYER AT 1324 06/22/19 SDR (NOTE) SARS-CoV-2 target nucleic acids are DETECTED. SARS-CoV-2 RNA is generally detectable in upper respiratory specimens  during the acute phase of infection. Positive results are indicative of the presence of the identified virus, but do not rule out bacterial infection or co-infection with other pathogens not detected by the test. Clinical correlation with patient history and other diagnostic information is necessary to determine patient infection status. The expected result is Negative. Fact Sheet for Patients:  PinkCheek.be Fact Sheet for Healthcare Providers: GravelBags.it This test is not yet approved or cleared by the [place] FDA and  has been authorized for detection and/or diagnosis of SARS-CoV-2 by FDA under an Emergency Use Authorization (EUA).  This EUA will remain in effect (meaning this test can be used) for th e duration of  the COVID-19 declaration under Section  564(b)(1) of the Act, 21 U.S.C. section 360bbb-3(b)(1), unless the authorization is terminated or revoked sooner.    Influenza A by PCR NEGATIVE NEGATIVE Final   Influenza B by PCR NEGATIVE NEGATIVE Final    Comment: (NOTE) The Xpert Xpress SARS-CoV-2/FLU/RSV assay is intended as an aid in  the diagnosis of influenza from Nasopharyngeal swab specimens and  should not  be used as a sole basis for treatment. Nasal washings and  aspirates are unacceptable for Xpert Xpress SARS-CoV-2/FLU/RSV  testing. Fact Sheet for Patients: PinkCheek.be Fact Sheet for Healthcare Providers: GravelBags.it This test is not yet approved or cleared by the [place] FDA and  has been authorized for detection and/or diagnosis of SARS-CoV-2 by  FDA under an Emergency Use Authorization (EUA). This EUA will remain  in effect (meaning this test can be used) for the duration of the  Covid-19 declaration under Section 564(b)(1) of the Act, 21  U.S.C. section 360bbb-3(b)(1), unless the authorization is  terminated or revoked. Performed at [facility], [address] [place], [place] [address]   Blood culture (routine x 2)     Status: None (Preliminary result)   Collection Time: 06/22/19  6:55 AM   Specimen: BLOOD  Result Value Ref Range Status   Specimen Description BLOOD LEFT [facility]  Final   Special Requests   Final    BOTTLES DRAWN AEROBIC AND ANAEROBIC Blood Culture adequate volume   Culture   Final    NO GROWTH 3 DAYS Performed at [facility], [address] [place], [place] [address]    Report Status PENDING  Incomplete  Blood culture (routine x 2)     Status: None (Preliminary result)   Collection Time: 06/22/19  6:55 AM   Specimen: BLOOD  Result Value Ref Range Status   Specimen Description BLOOD LEFT ARM  Final   Special Requests   Final    BOTTLES DRAWN AEROBIC AND ANAEROBIC Blood Culture adequate volume   Culture    Final    NO GROWTH 3 DAYS Performed at [facility], [address] [place], [place] [address]    Report Status PENDING  Incomplete         Radiology Studies: CT SOFT TISSUE NECK W CONTRAST  Result Date: 06/24/2019 CLINICAL DATA:  Left neck mass on recent CTA. 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 EXAM: CT NECK WITH CONTRAST TECHNIQUE: Multidetector CT imaging of the neck was performed using the standard protocol following the bolus administration of intravenous contrast. CONTRAST:  25mL OMNIPAQUE IOHEXOL 300 MG/ML  SOLN COMPARISON:  Head and neck CTA 06/22/2019. PET-CT 05/29/2016. Neck CT 08/17/2015. FINDINGS: Pharynx and larynx: There is mild volume loss in the region of the left lateral tongue base without a locally recurrent mass. Widely patent airway with chronic asymmetric enlargement of the right laryngeal ventricle and right para form sinus. Salivary glands: Asymmetric left submandibular and left parotid atrophy. Punctate calculus in the right submandibular gland. Thyroid: Unremarkable. Lymph nodes: Soft tissue thickening and fat stranding throughout the left neck with obscuration of fascial planes consistent with prior radiation therapy. Heterogeneously enhancing masslike, ill-defined soft tissue in the left neck in levels II and III partially inseparable from the sternocleidomastoid and paraspinal musculature and extending over an area measuring approximately 3.6 x 3.3 x 7.0 cm (AP x transverse x craniocaudal), progressed from the 2018 PET-CT. Mild anterior displacement of the left common and internal carotid arteries with carotid artery encasement by low-density soft tissue. No evidence of contralateral lymphadenopathy. 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 Limited intracranial: Unremarkable. Visualized orbits: Not imaged. Mastoids and visualized paranasal sinuses: Mild bilateral maxillary sinus mucosal  thickening. Clear mastoid air cells. Skeleton: No suspicious osseous lesion. Mild cervical spondylosis. Upper chest: Patchy peripheral ground-glass opacities in both lung apices. Other: None. IMPRESSION: 1.  7 cm ill-defined left neck mass, increased from 2018 and consistent with recurrent/progressive tumor. 2. Left internal jugular vein occlusion. 3. Patchy ground-glass opacities in both lung apices consistent with known COVID-19 pneumonia. Electronically Signed   By: Logan Bores M.D.   On: 06/24/2019 15:10   ECHOCARDIOGRAM COMPLETE BUBBLE STUDY  Result Date: 06/23/2019    ECHOCARDIOGRAM REPORT   Patient Name:   TAHER VANNOTE Silveria Date of Exam: 06/23/2019 Medical Rec #:  [id number]     Height:       67.0 in Accession #:    [id number]    Weight:       180.0 lb Date of Birth:  1955-11-02     BSA:          1.934 m Patient Age:    32 years      BP:           135/72 mmHg Patient Gender: M             HR:           81 bpm. Exam Location:  [facility] Procedure: 2D Echo, Cardiac Doppler, Color Doppler and Saline Contrast Bubble            Study Indications:     Stroke 434.91  History:         Patient has no prior history of Echocardiogram examinations.                  Stroke; Risk Factors:Hypertension and Diabetes.  Sonographer:     Sherrie Sport RDCS (AE) Referring Phys:  Media Diagnosing Phys: Ida Rogue MD  Sonographer Comments: Technically difficult study due to poor echo windows. 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 IMPRESSIONS  1. Left ventricular ejection fraction, by estimation, is 55%. The left ventricle has normal function. The left ventricle has no regional wall motion abnormalities. There is mild left ventricular hypertrophy. Left ventricular diastolic parameters are consistent with Grade I diastolic dysfunction (impaired relaxation).  2. Right ventricular systolic function is normal. The right ventricular size is normal.  Tricuspid regurgitation signal is inadequate for assessing PA pressure.  3. Agitated saline contrast bubble study was negative, with no evidence of any interatrial shunt. FINDINGS  Left Ventricle: Left ventricular ejection fraction, by estimation, is 55 to 60%. The left ventricle has normal function. The left ventricle has no regional wall motion abnormalities. The left ventricular internal cavity size was normal in size. There is  mild left ventricular hypertrophy. Left ventricular diastolic parameters are consistent with Grade I diastolic dysfunction (impaired relaxation). Right Ventricle: The right ventricular size is normal. No increase in right ventricular wall thickness. Right ventricular systolic function is normal. Tricuspid regurgitation signal is inadequate for assessing PA pressure. Left Atrium: Left atrial size was normal in size. Right Atrium: Right atrial size was normal in size. Pericardium: There is no evidence of pericardial effusion. Mitral Valve: The mitral valve is normal in structure. Normal mobility of the mitral valve leaflets. No evidence of mitral valve regurgitation. No evidence of mitral valve stenosis. Tricuspid Valve: The tricuspid valve is not well visualized. Tricuspid valve regurgitation is not demonstrated. No evidence of tricuspid stenosis. Aortic Valve: The aortic valve was not well visualized. Aortic valve regurgitation is not visualized. No aortic stenosis is present. Aortic valve mean gradient measures 2.5 mmHg. Aortic valve peak gradient measures 4.4 mmHg. Aortic valve area, by VTI measures 3.36 cm. Pulmonic Valve: The  pulmonic valve was not well visualized. Pulmonic valve regurgitation is not visualized. No evidence of pulmonic stenosis. Aorta: The aortic root is normal in size and structure. Venous: The inferior vena cava is normal in size with greater than 50% respiratory variability, suggesting right atrial pressure of 3 mmHg. IAS/Shunts: No atrial level shunt detected by  color flow Doppler. Agitated saline contrast was given intravenously to evaluate for intracardiac shunting. Agitated saline contrast bubble study was negative, with no evidence of any interatrial shunt. There  is no evidence of a patent foramen ovale. There is no evidence of an atrial septal defect.  LEFT VENTRICLE PLAX 2D LVIDd:         5.15 cm LVIDs:         2.91 cm LV PW:         1.40 cm LV IVS:        1.32 cm LVOT diam:     2.20 cm LV SV:         55 LV SV Index:   29 LVOT Area:     3.80 cm  LEFT ATRIUM         Index LA diam:    3.40 cm 1.76 cm/m  AORTIC VALVE                   PULMONIC VALVE AV Area (Vmax):    2.20 cm    PV Vmax:        0.77 m/s AV Area (Vmean):   2.72 cm    PV Peak grad:   2.3 mmHg AV Area (VTI):     3.36 cm    RVOT Peak grad: 5 mmHg AV Vmax:           104.55 cm/s AV Vmean:          68.350 cm/s AV VTI:            0.165 m AV Peak Grad:      4.4 mmHg AV Mean Grad:      2.5 mmHg LVOT Vmax:         60.60 cm/s LVOT Vmean:        48.900 cm/s LVOT VTI:          0.146 m LVOT/AV VTI ratio: 0.88  AORTA Ao Root diam: 3.60 cm MITRAL VALVE MV Area (PHT): 3.20 cm    SHUNTS MV Decel Time: 237 msec    Systemic VTI:  0.15 m MV E velocity: 57.70 cm/s  Systemic Diam: 2.20 cm MV A velocity: 96.00 cm/s MV E/A ratio:  0.60 Ida Rogue MD Electronically signed by Ida Rogue MD Signature Date/Time: 06/23/2019/2:20:03 PM    Final         Scheduled Meds: . vitamin C  500 mg Oral Daily  . aspirin EC  81 mg Oral Daily  . atorvastatin  40 mg Oral Daily  . enoxaparin (LOVENOX) injection  40 mg Subcutaneous Q24H  . insulin aspart  0-5 Units Subcutaneous QHS  . insulin aspart  0-9 Units Subcutaneous TID WC  . ipratropium  2 puff Inhalation Q4H  . methylPREDNISolone (SOLU-MEDROL) injection  40 mg Intravenous Q12H  . zinc sulfate  220 mg Oral Daily   Continuous Infusions: . remdesivir 100 mg in NS 100 mL 100 mg (06/25/19 0907)     LOS: 3 days    Time spent: 35 min    Sidney Ace,  MD [facility] Pager [phone]  If 7PM-7AM, please contact night-coverage 06/25/2019, 1:59 PM

## 2019-06-25 NOTE — Progress Notes (Signed)
Physical Therapy Treatment Patient Details Name: LARWENCE TU MRN: [id number] DOB: [birth year] Today's Date: 06/25/2019    History of Present Illness Dequante Tremaine Sheridan is an 64 y.o. 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  Patient left the ED, but later returned via [facility] with left extremity weakness. MRI of the brain showed acute/subacute infarcts in the right occipital, right temporal and right thalamic regions.  Some associated petechial hemorrhage noted.  Follow up CTA shows multiple areas of stenosis/occlusion including left vertebral origin moderate to severe, right PCA occlusion, left PCA severe, proximal right M2 moderate to severe.  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    PT Comments    Pt was long sitting in bed upon arriving. He is highly motivated and eager to get OOB however has severe deficits with insight of limitations. Pt is impulsive throughout and anxious to get home. Therapist discussed importance of progressing with therapy safely. Resting BP 144/90. He was able to roll to short sit with min assist + max verbal and tactile cues for proper technique and sequencing. Pt was asymptomatic in sitting however once pt stood, c/o feeling " woozy" BP in standing 190/110. Pt has severe L neglect throughout session with poor sensation on LUE (hand) He continues to be high fall risk due to poor safety awareness and insight of deficits. Prior to sitting he did take 3 steps to [facility] with LLE ataxia with advancement. Pt will benefit from SNF at [place] to address deficits with balance, gait, strength, coordination and overall safe functional mobility. Pt states several times he wants to go home  throughout session but was educated on safety concerns of doing so. Acute PT will continue to follow per POC progressing as able.     Follow Up Recommendations  SNF;Supervision for mobility/OOB     Equipment Recommendations  None recommended by PT    Recommendations for Other Services       Precautions / Restrictions Precautions Precautions: Fall Precaution Comments: High fall Restrictions Weight Bearing Restrictions: No    Mobility  Bed Mobility Overal bed mobility: Needs Assistance Bed Mobility: Supine to Sit;Sidelying to Sit;Sit to Sidelying;Sit to Supine     Supine to sit: Min assist(max vcs for technique)   Sit to sidelying: Min guard General bed mobility comments: Pt was able to progress from supine to sitting via rolling with max vcs for technique and LUE/LLE awareness. pt has L neglect and decreased sensations on L hand. Pt very impulsive requiring constant vcs for slowing down.   Transfers Overall transfer level: Needs assistance Equipment used: 1 person hand held assist Transfers: Sit to/from Stand Sit to Stand: Min guard         General transfer comment: Pt was able to stand EOB 1 x with LUE HHA + CGA. pt demonstrated strength required to perform however BP in standing elevated to 170/110 and pt reports symptoms so was returned to seated then supine position. Pt did demonstrate L lateral lean and very poor awareness/proprioception of LLE/LUE.  Ambulation/Gait             General Gait Details: pt was able to take steps from FOB to [facility] but very unsteady and ataxic with LLE advancement  Stairs             Wheelchair Mobility    Modified Rankin (Stroke Patients Only)       Balance Overall balance assessment: Needs assistance Sitting-balance support: Feet supported;No upper extremity supported Sitting balance-Leahy Scale: Good   Postural control: Left lateral lean Standing balance support: During functional activity;Single extremity  supported Standing balance-Leahy Scale: Poor Standing balance comment: pt very high fall risk 2/2 to impulsiveity and poor awareness of deficits. L neglect throughout session. Visual deficits affecting balance as well.                            Cognition Arousal/Alertness: Awake/alert Behavior During Therapy: Impulsive Overall Cognitive Status: Impaired/Different from baseline Area of Impairment: Problem solving;Following commands;Safety/judgement;Awareness                       Following Commands: Follows one step commands with increased time;Follows one step commands consistently;Follows multi-step commands inconsistently Safety/Judgement: Decreased awareness of safety;Decreased awareness of deficits   Problem Solving: Slow processing;Difficulty sequencing;Requires verbal cues;Requires tactile cues General Comments: pt very motivated however severe L neglect and impulsivity. max cueing to relax while BP being read      Exercises      General Comments        Pertinent Vitals/Pain Pain Assessment: No/denies pain    Home Living                      Prior Function            PT Goals (current goals can now be found in the care plan section) Acute Rehab PT Goals Patient Stated Goal: I want to go home" Progress towards PT goals: Progressing toward goals    Frequency    7X/week      PT Plan Current plan remains appropriate    Co-evaluation              AM-PAC PT "6 Clicks" Mobility   Outcome Measure  Help needed turning from your back to your side while in a flat bed without using bedrails?: A Little Help needed moving from lying on your back to sitting on the side of a flat bed without using bedrails?: A Little Help needed moving to and from a bed to a chair (including a wheelchair)?: A Lot Help needed standing up from a chair using your arms (e.g., wheelchair or bedside chair)?: A Lot Help needed to walk in hospital room?: A  Lot Help needed climbing 3-5 steps with a railing? : Total 6 Click Score: 13    End of Session Equipment Utilized During Treatment: Gait belt Activity Tolerance: Treatment limited secondary to medical complications (Comment)(BP/ symptoms with standing) Patient left: in bed;with call bell/phone within reach Nurse Communication: Mobility status PT Visit Diagnosis: Unsteadiness on feet (R26.81);Difficulty in walking, not elsewhere classified (R26.2);Hemiplegia and hemiparesis Hemiplegia - Right/Left: Left Hemiplegia - dominant/non-dominant: Dominant Hemiplegia - caused by: Cerebral infarction     Time: 6503-5465 PT Time Calculation (min) (ACUTE ONLY): 23 min  Charges:  $Therapeutic Activity: 23-37 mins                     Julaine Fusi PTA 06/25/19, 4:20 PM

## 2019-06-26 LAB — FIBRIN DERIVATIVES D-DIMER (ARMC ONLY): Fibrin derivatives D-dimer (ARMC): 586.99 ng/mL (FEU) — ABNORMAL HIGH (ref 0.00–499.00)

## 2019-06-26 LAB — COMPREHENSIVE METABOLIC PANEL
ALT: 29 U/L (ref 0–44)
AST: 33 U/L (ref 15–41)
Albumin: 3.6 g/dL (ref 3.5–5.0)
Alkaline Phosphatase: 72 U/L (ref 38–126)
Anion gap: 9 (ref 5–15)
BUN: 27 mg/dL — ABNORMAL HIGH (ref 8–23)
CO2: 27 mmol/L (ref 22–32)
Calcium: 8.4 mg/dL — ABNORMAL LOW (ref 8.9–10.3)
Chloride: 98 mmol/L (ref 98–111)
Creatinine, Ser: 1.01 mg/dL (ref 0.61–1.24)
GFR calc Af Amer: >60 mL/min (ref >60)
GFR calc non Af Amer: >60 mL/min (ref >60)
Glucose, Bld: 189 mg/dL — ABNORMAL HIGH (ref 70–99)
Potassium: 3.5 mmol/L (ref 3.5–5.1)
Sodium: 134 mmol/L — ABNORMAL LOW (ref 135–145)
Total Bilirubin: 1 mg/dL (ref 0.3–1.2)
Total Protein: 6.3 g/dL — ABNORMAL LOW (ref 6.5–8.1)

## 2019-06-26 LAB — CBC WITH DIFFERENTIAL/PLATELET
Abs Immature Granulocytes: 0.09 10*3/uL — ABNORMAL HIGH (ref 0.00–0.07)
Basophils Absolute: 0 10*3/uL (ref 0.0–0.1)
Basophils Relative: 0 %
Eosinophils Absolute: 0 10*3/uL (ref 0.0–0.5)
Eosinophils Relative: 0 %
HCT: 39.6 % (ref 39.0–52.0)
Hemoglobin: 14.4 g/dL (ref 13.0–17.0)
Immature Granulocytes: 1 %
Lymphocytes Relative: 5 %
Lymphs Abs: 0.7 10*3/uL (ref 0.7–4.0)
MCH: 29.3 pg (ref 26.0–34.0)
MCHC: 36.4 g/dL — ABNORMAL HIGH (ref 30.0–36.0)
MCV: 80.7 fL (ref 80.0–100.0)
Monocytes Absolute: 1 10*3/uL (ref 0.1–1.0)
Monocytes Relative: 7 %
Neutro Abs: 12.8 10*3/uL — ABNORMAL HIGH (ref 1.7–7.7)
Neutrophils Relative %: 87 %
Platelets: 355 10*3/uL (ref 150–400)
RBC: 4.91 MIL/uL (ref 4.22–5.81)
RDW: 13 % (ref 11.5–15.5)
WBC: 14.6 10*3/uL — ABNORMAL HIGH (ref 4.0–10.5)
nRBC: 0 % (ref 0.0–0.2)

## 2019-06-26 LAB — GLUCOSE, CAPILLARY
Glucose-Capillary: 171 mg/dL — ABNORMAL HIGH (ref 70–99)
Glucose-Capillary: 157 mg/dL — ABNORMAL HIGH (ref 70–99)
Glucose-Capillary: 273 mg/dL — ABNORMAL HIGH (ref 70–99)
Glucose-Capillary: 158 mg/dL — ABNORMAL HIGH (ref 70–99)

## 2019-06-26 LAB — FERRITIN: Ferritin: 340 ng/mL — ABNORMAL HIGH (ref 24–336)

## 2019-06-26 LAB — C-REACTIVE PROTEIN: CRP: 0.5 mg/dL (ref <1.0)

## 2019-06-26 MED ORDER — OXYMETAZOLINE HCL 0.05 % NA SOLN
1.0000 | Freq: Two times a day (BID) | NASAL | Status: DC
  Administered 2019-06-26 – 2019-07-05 (×18): 1 via NASAL
  Filled 2019-06-26: qty 15

## 2019-06-26 NOTE — Progress Notes (Signed)
PROGRESS NOTE    Kent [name]  WLN:[id number] DOB: [birth year] DOA: 06/22/2019 PCP: System, Pcp Not In    Brief Narrative: HPI: Kent [name] is a 64 y.o. 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  Pt had positive Covid19 test 4 days ago. He denies cough or shortness breath, but per RN in ED, pt has mild dry cough. No fever or chills. He states that he has been feeling weak and fatigue since then. He states tha0t he was asleep and when he woke up he feel weaker in left arm. He also states that he is having intermittent weakness in his legs bilaterally. He has dizziness when he stands up and walks. Per report, pt had possible slurry speech.  No vision loss or hearing loss. Pt reports incontinence of bladder and bowels.  No nausea, vomiting, or abdominal pain.  His son states that he had mild loose stool bowel movement at home.  Patient had oxygen desaturation to 86% on room air initially, currently 96% on room air.  Blood pressure 201/141, which improved to 123/93. Cardene gttt is ordered, but nopt started per RN in ED. HR 39 -108, RR21.  Chest x-ray showed right upper lobe patchy infiltration.  Patient is admitted to [facility] bed as inpatient.  4/22: Patient seen and examined.  Still very sleepy and lethargic.  Does awaken easily and answers all my questions appropriately.  Neurosurgery consulted for aneurysms noted on CT angio.  Further initial review of imaging no indication for intervention.  Aneurysms appear small.  Recommend follow-up for neck mass noted on CT.  4/23: Patient seen and examined.  Sleeping on my arrival but easily awakened.  Answers questions appropriately.  Patient has been seen by a oncology, neurology, neurosurgery.  Running  hypertensive, permissive in setting of stroke.  4/24: Patient seen and examined.  Easily awakened.  Continues to endorse fatigue.  Answers all questions appropriately.  Blood pressure improved over interval.  4/25: Patient seen and examined.  Sleeping but easily awakened.  Reports some improvement in his fatigue.  [place] working with physical therapy.  Pending skilled nursing facility placement.     Assessment & Plan:   Principal Problem:   Stroke [facility]) Active Problems:   Hypertension   Hyperlipidemia   Hypokalemia   Hypertensive urgency   Acute respiratory failure with hypoxia (HCC)   Diabetes mellitus without complication (HCC)   Chronic diastolic CHF (congestive heart failure) (HCC)   Pneumonia due to COVID-19 virus  Stroke MRI of brain showed multifocal changes of acute/early subacute infarction within the right PCA territory involving the right occipital and temporal lobes.  Small amount of associated petechial hemorrhage within the right occipital lobe.  Acute/early subacute infarcts are also present within the right thalamus.   Neurology, Dr. Doy Mince is consulted. Plan: Echo with bubble, Ef 55%, no WMA, negative bubble Permissive hypertension for 48 hours. continue home ASA (pt was given 324 mg of ASA already) fasting lipid panel: no HLD noted  HbA1c 9.2, poor control of DM RN swallow screen, passed, diet advanced UDS: negative PT and OT consults: rec SNF  Hypertension and Hypertensive urgency Hypertensive urgency resolved Permissive hypertension allowed for 48 hours Restart home losartan Continue as needed IV hydralazine  Hyperlipidemia: - Lipitor 40 mg daily  Hypokalemia:  K=  3.2 on admission. - Repleted - Check Mg level: NL  Acute respiratory failure with hypoxia due to [place] due to COVID-19 virus  initially pt had oxygen desaturation to 86% on room air.   Chest x-ray showed right upper lobe patchy infiltration. Plan: - Remdesivir per pharm  (day 5/5) - Solumedrol 40 mg bid (day 5/10) - vitamin C, zinc.  - Bronchodilators - PRN Mucinex for cough - f/u Blood culture - Follow inflammatory markers  Diabetes mellitus without complication (HCC)  Most recent A1c 9.2, poorly controled.  Patient is taking Metformin at home -SSI - Hold home metformin  Chronic diastolic CHF (congestive heart failure) ([place]) 2D echo on 01/09/2016 showed EF of 45-50%.   No leg edema or JVD.   CHF seems to be compensated.  History of SCC head and neck History of GIST Per patients son he has been lost to onc followup since 2017 CT angio neck demonstrates soft tissue mass concerning for tumor recurrence Patients last visit to oncology at [facility] 2017 Oncology reviewed CAT scan Concerning for recurrence Patient will need outpatient PET scan  DVT prophylaxis: lovenox Code Status: Partial Family Communication: Patient's son Kent [name] via phone [phone] on 06/23/2019 Disposition Plan: Status is: Inpatient  Status is: Inpatient  Remains inpatient appropriate because:Inpatient level of care appropriate due to severity of illness   Dispo: The patient is from: Home              Anticipated d/c is to: SNF              Anticipated d/c date is: 2 days              Patient currently is not medically stable to d/c.   Completing course of remdesevir today.  Anticipate medical readiness for discharge on 06/27/2019          Consultants:   Neurology  NSG  Oncology   Procedures:   2D echo with bubble  Antimicrobials:   Remdesivir    Subjective: Seen and examined Lethargic but easily awakened No pain complaints  Objective: Vitals:   06/25/19 2335 06/26/19 0416 06/26/19 0832 06/26/19 1146  BP: 110/72 136/77 111/66 127/90  Pulse: 65 86 83 70  Resp: 18 20 16    Temp:  98.4 F (36.9 C) 98.8 F (37.1 C)   TempSrc:  Oral    SpO2: 97% 98% 99%   Weight:      Height:        Intake/Output Summary (Last 24 hours) at  06/26/2019 1242 Last data filed at 06/26/2019 0431 Gross per 24 hour  Intake --  Output 1000 ml  Net -1000 ml   Filed Weights   06/22/19 0523  Weight: 81.6 kg    Examination:  General exam: Appears calm and comfortable  Respiratory system: Clear to auscultation. Respiratory effort normal. Cardiovascular system: S1 & S2 heard, RRR. No JVD, murmurs, rubs, gallops or clicks. No pedal edema. Gastrointestinal system: Abdomen is nondistended, soft and nontender. No organomegaly or masses felt. Normal bowel sounds heard. Central nervous system: Decreased muscle strength in left arm and left leg.  Alert oriented x3 Extremities: Symmetric 5 x 5 power. Skin: No rashes, lesions or ulcers Psychiatry: Judgement and insight appear normal. Mood & affect appropriate.     Data Reviewed: I have personally reviewed following labs and imaging studies  CBC: Recent Labs  Lab 06/21/19 2203 06/23/19 0405 06/24/19 0513 06/25/19 0654 06/26/19 0507  WBC 6.8 9.1 14.7* 15.1* 14.6*  NEUTROABS  --  8.1* 13.1* 13.0* 12.8*  HGB 13.8 14.9 14.7 14.6 14.4  HCT 38.9* 42.3 40.4 41.3 39.6  MCV 81.9 83.1 81.1 82.8 80.7  PLT 303 347 408* 383 655   Basic Metabolic Panel: Recent Labs  Lab 06/22/19 0600 06/23/19 0405 06/24/19 0513 06/25/19 0654 06/26/19 0507  NA 136 135 135 133* 134*  K 3.2* 3.6 3.5 3.9 3.5  CL 98 97* 96* 95* 98  CO2 28 24 28 27 27   GLUCOSE 178* 155* 185* 173* 189*  BUN 5* 10 18 23  27*  CREATININE 0.85 0.99 1.03 1.13 1.01  CALCIUM 8.6* 8.6* 8.8* 8.7* 8.4*  MG  --  1.6*  --   --   --    GFR: Estimated Creatinine Clearance: 75.6 mL/min (by C-G formula based on SCr of 1.01 mg/dL). Liver Function Tests: Recent Labs  Lab 06/22/19 0722 06/23/19 0405 06/24/19 0513 06/25/19 0654 06/26/19 0507  AST 27 23 27  37 33  ALT 20 24 25 25 29   ALKPHOS 87 87 79 78 72  BILITOT 1.0 1.0 1.2 1.1 1.0  PROT 7.2 7.1 6.8 6.7 6.3*  ALBUMIN 3.9 3.8 3.9 3.8 3.6   No results for input(s): LIPASE,  AMYLASE in the last 168 hours. No results for input(s): AMMONIA in the last 168 hours. Coagulation Profile: Recent Labs  Lab 06/22/19 0600  INR 1.0   Cardiac Enzymes: No results for input(s): CKTOTAL, CKMB, CKMBINDEX, TROPONINI in the last 168 hours. BNP (last 3 results) No results for input(s): PROBNP in the last 8760 hours. HbA1C: No results for input(s): HGBA1C in the last 72 hours. CBG: Recent Labs  Lab 06/25/19 1155 06/25/19 1714 06/25/19 2054 06/26/19 0829 06/26/19 1159  GLUCAP 159* 191* 136* 171* 157*   Lipid Profile: No results for input(s): CHOL, HDL, LDLCALC, TRIG, CHOLHDL, LDLDIRECT in the last 72 hours. Thyroid Function Tests: No results for input(s): TSH, T4TOTAL, FREET4, T3FREE, THYROIDAB in the last 72 hours. Anemia Panel: Recent Labs    06/25/19 0654 06/26/19 0507  FERRITIN 337* 340*   Sepsis Labs: Recent Labs  Lab 06/22/19 0600 06/22/19 0655  PROCALCITON <0.10  --   LATICACIDVEN  --  1.4    Recent Results (from the past 240 hour(s))  Respiratory Panel by RT PCR (Flu A&B, Covid) - Urine, Clean Catch     Status: Abnormal   Collection Time: 06/22/19  5:10 AM   Specimen: Urine, Clean Catch  Result Value Ref Range Status   SARS Coronavirus 2 by RT PCR POSITIVE (A) NEGATIVE Final    Comment: RESULT CALLED TO, READ BACK BY AND VERIFIED WITH: GREG MOYER AT 3748 06/22/19 SDR (NOTE) SARS-CoV-2 target nucleic acids are DETECTED. SARS-CoV-2 RNA is generally detectable in upper respiratory specimens  during the acute phase of infection. Positive results are indicative of the presence of the identified virus, but do not rule out bacterial infection or co-infection with other pathogens not detected by the test. Clinical correlation with patient history and other diagnostic information is necessary to determine patient infection status. The expected result is Negative. Fact Sheet for Patients:  PinkCheek.be Fact Sheet for  Healthcare Providers: GravelBags.it This test is not yet approved or cleared by the [place] FDA and  has been authorized for detection and/or diagnosis of SARS-CoV-2 by FDA under an Emergency Use Authorization (EUA).  This EUA will remain in effect (meaning this test can be used) for th e duration of  the COVID-19 declaration under Section 564(b)(1) of the Act, 21 U.S.C. section  360bbb-3(b)(1), unless the authorization is terminated or revoked sooner.    Influenza A by PCR NEGATIVE NEGATIVE Final   Influenza B by PCR NEGATIVE NEGATIVE Final    Comment: (NOTE) The Xpert Xpress SARS-CoV-2/FLU/RSV assay is intended as an aid in  the diagnosis of influenza from Nasopharyngeal swab specimens and  should not be used as a sole basis for treatment. Nasal washings and  aspirates are unacceptable for Xpert Xpress SARS-CoV-2/FLU/RSV  testing. Fact Sheet for Patients: PinkCheek.be Fact Sheet for Healthcare Providers: GravelBags.it This test is not yet approved or cleared by the [place] FDA and  has been authorized for detection and/or diagnosis of SARS-CoV-2 by  FDA under an Emergency Use Authorization (EUA). This EUA will remain  in effect (meaning this test can be used) for the duration of the  Covid-19 declaration under Section 564(b)(1) of the Act, 21  U.S.C. section 360bbb-3(b)(1), unless the authorization is  terminated or revoked. Performed at [facility], [address] [place], [place] [address]   Blood culture (routine x 2)     Status: None (Preliminary result)   Collection Time: 06/22/19  6:55 AM   Specimen: BLOOD  Result Value Ref Range Status   Specimen Description BLOOD LEFT [facility]  Final   Special Requests   Final    BOTTLES DRAWN AEROBIC AND ANAEROBIC Blood Culture adequate volume   Culture   Final    NO GROWTH 4 DAYS Performed at [facility], [address] [place], [place] [address]    Report Status PENDING  Incomplete  Blood culture (routine x 2)     Status: None (Preliminary result)   Collection Time: 06/22/19  6:55 AM   Specimen: BLOOD  Result Value Ref Range Status   Specimen Description BLOOD LEFT ARM  Final   Special Requests   Final    BOTTLES DRAWN AEROBIC AND ANAEROBIC Blood Culture adequate volume   Culture   Final    NO GROWTH 4 DAYS Performed at [facility], [address] [place], [place] [address]    Report Status PENDING  Incomplete         Radiology Studies: No results found.      Scheduled Meds: . vitamin C  500 mg Oral Daily  . aspirin EC  81 mg Oral Daily  . atorvastatin  40 mg Oral Daily  . enoxaparin (LOVENOX) injection  40 mg Subcutaneous Q24H  . insulin aspart  0-5 Units Subcutaneous QHS  . insulin aspart  0-9 Units Subcutaneous TID WC  . ipratropium  2 puff Inhalation Q4H  . losartan  50 mg Oral Daily  . methylPREDNISolone (SOLU-MEDROL) injection  40 mg Intravenous Q12H  . oxymetazoline  1 spray Each Nare BID  . zinc sulfate  220 mg Oral Daily   Continuous Infusions:    LOS: 4 days    Time spent: 35 min    Sidney Ace, MD [facility] Pager [phone]  If 7PM-7AM, please contact night-coverage 06/26/2019, 12:42 PM

## 2019-06-26 NOTE — Progress Notes (Signed)
Physical Therapy Treatment Patient Details Name: Kent [name] MRN: [id number] DOB: 1955-07-27 Today's Date: 06/26/2019    History of Present Illness Kent [name] is an 64 y.o. 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  Patient left the ED, but later returned via [facility] with left extremity weakness. MRI of the brain showed acute/subacute infarcts in the right occipital, right temporal and right thalamic regions.  Some associated petechial hemorrhage noted.  Follow up CTA shows multiple areas of stenosis/occlusion including left vertebral origin moderate to severe, right PCA occlusion, left PCA severe, proximal right M2 moderate to severe.  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    PT Comments    Pt ready for session.  Excited to get up.  Participated in exercises as described below.  To EOB with min a x 1 and cues.  Sitting EOB with min guard.  Continues with L lean.  Attempted to stand to RW with mod a x 1 but generally unsteady in standing leaning to L trying to grab lower bar of walker.  Instructed to sit and stand pivot to recliner at bedside with min a x 1 with overall improved technique and safety.  BP in chair 127/90 P 70 with no c/o wooziness today.  He is able to tell me that he needs to call for help before getting up and chair alarm is set.  Relayed to RN response to session.   Pt excited to be up in the chair today.   Follow Up Recommendations  SNF;Supervision for mobility/OOB     Equipment Recommendations       Recommendations for Other Services       Precautions / Restrictions Precautions Precautions: Fall Restrictions Weight Bearing Restrictions: No    Mobility  Bed Mobility Overal bed mobility: Needs Assistance Bed Mobility: Supine to Sit     Supine to sit: Min assist        Transfers Overall transfer level: Needs assistance Equipment used: Rolling walker (2 wheeled);1 person hand held assist Transfers: Sit to/from [facility] Sit to Stand: Mod assist Stand pivot transfers: Min assist       General transfer comment: Poor ability to stand with RW making it generally unsafe.  stnd pivot to chair much safer for pt and staff a this time.  Ambulation/Gait                 Stairs             Wheelchair Mobility    Modified Rankin (Stroke Patients Only)       Balance Overall balance assessment: Needs assistance Sitting-balance support: Feet supported;No upper extremity supported Sitting balance-Leahy Scale: Fair     Standing balance support: During functional activity;Single extremity supported Standing balance-Leahy Scale: Poor                              Cognition Arousal/Alertness: Awake/alert Behavior During Therapy: WFL for tasks assessed/performed Overall Cognitive Status: Within Functional Limits for tasks assessed  General Comments: motivated, less impulsive today, excited to be up in the chair.      Exercises Other Exercises Other Exercises: BLE 3 x 10 supine ex for heel slides, ab/add, slr with cues for technique while room was being readied.    General Comments        Pertinent Vitals/Pain Pain Assessment: No/denies pain    Home Living                      Prior Function            PT Goals (current goals can now be found in the care plan section) Progress towards PT goals: Progressing toward goals    Frequency    7X/week      PT Plan Current plan remains appropriate    Co-evaluation              AM-PAC PT "6 Clicks" Mobility   Outcome Measure  Help needed turning from your back to  your side while in a flat bed without using bedrails?: A Little Help needed moving from lying on your back to sitting on the side of a flat bed without using bedrails?: A Little Help needed moving to and from a bed to a chair (including a wheelchair)?: A Lot Help needed standing up from a chair using your arms (e.g., wheelchair or bedside chair)?: A Lot Help needed to walk in hospital room?: A Lot Help needed climbing 3-5 steps with a railing? : Total 6 Click Score: 13    End of Session Equipment Utilized During Treatment: Gait belt Activity Tolerance: Patient tolerated treatment well Patient left: in chair;with call bell/phone within reach;with chair alarm set Nurse Communication: Mobility status Hemiplegia - Right/Left: Left Hemiplegia - dominant/non-dominant: Dominant Hemiplegia - caused by: Cerebral infarction     Time: 1200-1228 PT Time Calculation (min) (ACUTE ONLY): 28 min  Charges:  $Therapeutic Exercise: 8-22 mins $Therapeutic Activity: 8-22 mins                    Chesley Noon, PTA 06/26/19, 12:43 PM

## 2019-06-26 NOTE — Progress Notes (Signed)
Wife given nightly update

## 2019-06-27 LAB — CULTURE, BLOOD (ROUTINE X 2)
Culture: NO GROWTH
Culture: NO GROWTH
Special Requests: ADEQUATE
Special Requests: ADEQUATE

## 2019-06-27 LAB — CBC WITH DIFFERENTIAL/PLATELET
Abs Immature Granulocytes: 0.15 10*3/uL — ABNORMAL HIGH (ref 0.00–0.07)
Basophils Absolute: 0 10*3/uL (ref 0.0–0.1)
Basophils Relative: 0 %
Eosinophils Absolute: 0 10*3/uL (ref 0.0–0.5)
Eosinophils Relative: 0 %
HCT: 43.7 % (ref 39.0–52.0)
Hemoglobin: 15.5 g/dL (ref 13.0–17.0)
Immature Granulocytes: 1 %
Lymphocytes Relative: 5 %
Lymphs Abs: 0.7 10*3/uL (ref 0.7–4.0)
MCH: 29 pg (ref 26.0–34.0)
MCHC: 35.5 g/dL (ref 30.0–36.0)
MCV: 81.8 fL (ref 80.0–100.0)
Monocytes Absolute: 1.1 10*3/uL — ABNORMAL HIGH (ref 0.1–1.0)
Monocytes Relative: 7 %
Neutro Abs: 13.8 10*3/uL — ABNORMAL HIGH (ref 1.7–7.7)
Neutrophils Relative %: 87 %
Platelets: 397 10*3/uL (ref 150–400)
RBC: 5.34 MIL/uL (ref 4.22–5.81)
RDW: 13.2 % (ref 11.5–15.5)
WBC: 15.8 10*3/uL — ABNORMAL HIGH (ref 4.0–10.5)
nRBC: 0 % (ref 0.0–0.2)

## 2019-06-27 LAB — COMPREHENSIVE METABOLIC PANEL
ALT: 32 U/L (ref 0–44)
AST: 27 U/L (ref 15–41)
Albumin: 4 g/dL (ref 3.5–5.0)
Alkaline Phosphatase: 77 U/L (ref 38–126)
Anion gap: 10 (ref 5–15)
BUN: 32 mg/dL — ABNORMAL HIGH (ref 8–23)
CO2: 29 mmol/L (ref 22–32)
Calcium: 8.5 mg/dL — ABNORMAL LOW (ref 8.9–10.3)
Chloride: 96 mmol/L — ABNORMAL LOW (ref 98–111)
Creatinine, Ser: 1.03 mg/dL (ref 0.61–1.24)
GFR calc Af Amer: >60 mL/min (ref >60)
GFR calc non Af Amer: >60 mL/min (ref >60)
Glucose, Bld: 176 mg/dL — ABNORMAL HIGH (ref 70–99)
Potassium: 3.5 mmol/L (ref 3.5–5.1)
Sodium: 135 mmol/L (ref 135–145)
Total Bilirubin: 1 mg/dL (ref 0.3–1.2)
Total Protein: 7.1 g/dL (ref 6.5–8.1)

## 2019-06-27 LAB — GLUCOSE, CAPILLARY
Glucose-Capillary: 254 mg/dL — ABNORMAL HIGH (ref 70–99)
Glucose-Capillary: 229 mg/dL — ABNORMAL HIGH (ref 70–99)
Glucose-Capillary: 145 mg/dL — ABNORMAL HIGH (ref 70–99)
Glucose-Capillary: 160 mg/dL — ABNORMAL HIGH (ref 70–99)

## 2019-06-27 LAB — FIBRIN DERIVATIVES D-DIMER (ARMC ONLY): Fibrin derivatives D-dimer (ARMC): 671.22 ng/mL (FEU) — ABNORMAL HIGH (ref 0.00–499.00)

## 2019-06-27 LAB — C-REACTIVE PROTEIN: CRP: 0.5 mg/dL (ref <1.0)

## 2019-06-27 LAB — FERRITIN: Ferritin: 372 ng/mL — ABNORMAL HIGH (ref 24–336)

## 2019-06-27 MED ORDER — INSULIN DETEMIR 100 UNIT/ML ~~LOC~~ SOLN
8.0000 [IU] | Freq: Every day | SUBCUTANEOUS | Status: DC
  Administered 2019-06-27 – 2019-07-04 (×8): 8 [IU] via SUBCUTANEOUS
  Filled 2019-06-27 (×9): qty 0.08

## 2019-06-27 MED ORDER — TRAMADOL HCL 50 MG PO TABS
50.0000 mg | ORAL_TABLET | Freq: Four times a day (QID) | ORAL | Status: DC | PRN
  Administered 2019-06-27 – 2019-07-04 (×6): 50 mg via ORAL
  Filled 2019-06-27 (×6): qty 1

## 2019-06-27 MED ORDER — MECLIZINE HCL 12.5 MG PO TABS
12.5000 mg | ORAL_TABLET | Freq: Three times a day (TID) | ORAL | Status: DC | PRN
  Administered 2019-06-27 – 2019-07-03 (×4): 12.5 mg via ORAL
  Filled 2019-06-27 (×6): qty 1

## 2019-06-27 MED ORDER — INSULIN DETEMIR 100 UNIT/ML ~~LOC~~ SOLN: 8.0000 [IU] | Freq: Every day | SUBCUTANEOUS | Status: DC

## 2019-06-27 NOTE — TOC Progression Note (Addendum)
Transition of Care [facility]) - Progression Note    Patient Details  Name: Kent [name] MRN: [id number] Date of Birth: 06/29/1955  Transition of Care [facility]) CM/SW Contact  Nikky Duba, Gardiner Rhyme, LCSW Phone Number: 06/27/2019, 1:43 PM  Clinical Narrative:   Can see if eligible for CIR due to unable to go to SNF due to uninsured. MD has written an order and will let therapists know. If not an option will need to go home with home health and family assisting him. Will see if can take.  3:00- Message left for wife to inform of pursuing CIR option. Will await consult  Expected Discharge Plan: [place] Barriers to Discharge: Continued Medical Work up  Expected Discharge Plan and Services Expected Discharge Plan: [place] In-house Referral: Clinical Social Work   Post Acute Care Choice: [place] arrangements for the past 2 months: Single Family Home                                       Social Determinants of Health (SDOH) Interventions    Readmission Risk Interventions No flowsheet data found.

## 2019-06-27 NOTE — Progress Notes (Addendum)
Physical Therapy Treatment Patient Details Name: Kent [name] MRN: [id number] DOB: 12/12/1955 Today's Date: 06/27/2019    History of Present Illness Kent [name] is an 64 y.o. 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  Patient left the ED, but later returned via [facility] with left extremity weakness. MRI of the brain showed acute/subacute infarcts in the right occipital, right temporal and right thalamic regions.  Some associated petechial hemorrhage noted.  Follow up CTA shows multiple areas of stenosis/occlusion including left vertebral origin moderate to severe, right PCA occlusion, left PCA severe, proximal right M2 moderate to severe.  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    PT Comments    Pt ready for session.  Participated in exercises as described below.  To EOB with min a x 1 most for balance.  Once sitting, generally unsteady but able to sit with constant tactile guarding.  Attempted to stand x 4 during session.  Increased difficulty today due to increased L extensor tone especially in L UE affecting standing quality and ability to transfer.  Yesterday he was safe +1 to chair.  Unable to transfer safely today with +1 assist.  Pt given increased time at EOB for balance and weight shifting practice.  While he is aware of deficits, safety remains an issue for pt.    SNF originally recommended but due to no insurance he does not quality per SWS.  After discussion with SWS and OT, will update recommendations to CIR.  Pt is highly motivated to participate in session and can tolerate extended time in therapy.   If CIR is not an option, he will need equipment listed below and 24 hour care  from family along with [place].   Follow Up Recommendations  CIR     Equipment Recommendations  Rolling walker with 5" wheels;3in1 (PT);Wheelchair (measurements PT);Wheelchair cushion (measurements PT)    Recommendations for Other Services       Precautions / Restrictions Precautions Precautions: Fall Precaution Comments: High fall Restrictions Weight Bearing Restrictions: No Other Position/Activity Restrictions: extensor tone L    Mobility  Bed Mobility Overal bed mobility: Needs Assistance Bed Mobility: Supine to Sit;Sit to Supine     Supine to sit: Min assist Sit to supine: Min assist      Transfers Overall transfer level: Needs assistance Equipment used: 1 person hand held assist Transfers: Sit to/from Stand Sit to Stand: Mod assist         General transfer comment: decreased standing quality today, increased L extensor tone  Ambulation/Gait             General Gait Details: unable   Stairs             Wheelchair Mobility    Modified Rankin (Stroke Patients Only)       Balance Overall balance assessment: Needs assistance Sitting-balance support: Feet supported;No upper extremity supported Sitting balance-Leahy Scale: Poor Sitting balance - Comments: less steady today.   Standing balance support: During functional activity;Single extremity supported Standing balance-Leahy Scale: Poor Standing balance comment: extnsor tone L affecting standing quality and balance  Cognition Arousal/Alertness: Awake/alert Behavior During Therapy: WFL for tasks assessed/performed Overall Cognitive Status: Within Functional Limits for tasks assessed                                        Exercises Other Exercises Other Exercises: BLE 3 x 10 supine ex for heel slides, ab/add, slr with cues for technique.  Seated LAQ and marches BLE.    General Comments        Pertinent Vitals/Pain Pain  Assessment: No/denies pain    Home Living                      Prior Function            PT Goals (current goals can now be found in the care plan section) Progress towards PT goals: Progressing toward goals    Frequency    7X/week      PT Plan Discharge plan needs to be updated    Co-evaluation              AM-PAC PT "6 Clicks" Mobility   Outcome Measure  Help needed turning from your back to your side while in a flat bed without using bedrails?: A Little Help needed moving from lying on your back to sitting on the side of a flat bed without using bedrails?: A Little Help needed moving to and from a bed to a chair (including a wheelchair)?: A Lot Help needed standing up from a chair using your arms (e.g., wheelchair or bedside chair)?: Total Help needed to walk in hospital room?: Total Help needed climbing 3-5 steps with a railing? : Total 6 Click Score: 11    End of Session Equipment Utilized During Treatment: Gait belt Activity Tolerance: Patient tolerated treatment well Patient left: in bed;with bed alarm set;with call bell/phone within reach Nurse Communication: Mobility status Hemiplegia - Right/Left: Left Hemiplegia - dominant/non-dominant: Dominant Hemiplegia - caused by: Cerebral infarction     Time: 7371-0626 PT Time Calculation (min) (ACUTE ONLY): 38 min  Charges:  $Therapeutic Exercise: 23-37 mins $Therapeutic Activity: 8-22 mins                    Chesley Noon, PTA 06/27/19, 4:00 PM

## 2019-06-27 NOTE — Progress Notes (Signed)
Inpatient Diabetes Program Recommendations  AACE/ADA: New Consensus Statement on Inpatient Glycemic Control (2015)  Target Ranges:  Prepandial:   less than 140 mg/dL      Peak postprandial:   less than 180 mg/dL (1-2 hours)      Critically ill patients:  140 - 180 mg/dL   Results for Kent [name], Kent [name] (MRN [id number]) as of 06/27/2019 12:16  Ref. Range 06/26/2019 08:29 06/26/2019 11:59 06/26/2019 16:57 06/26/2019 21:34  Glucose-Capillary Latest Ref Range: 70 - 99 mg/dL 171 (H)  2 units NOVOLOG  157 (H)  2 units NOVOLOG  273 (H)  5 units NOVOLOG  158 (H)   Results for Kent [name], Kent [name] (MRN [id number]) as of 06/27/2019 12:16  Ref. Range 06/27/2019 08:39 06/27/2019 12:02  Glucose-Capillary Latest Ref Range: 70 - 99 mg/dL 254 (H)  5 units NOVOLOG  229 (H)    Home DM Meds: Metformin 500 mg BID  Current Orders: Novolog Sensitive Correction Scale/ SSI (0-9 units) TID AC + HS    MD- Note patient Getting Solumedrol 40 mg BID.  CBGs were OK yesterday.  CBGs on the rise today.  If CBGs remain elevated, please consider:  1. Start Levemir 8 units Daily (0.1 units/kg)  2. Increase Novolog SSi to the Moderate scale (0-15 units) TID AC + HS     --Will follow patient during hospitalization--  Wyn Quaker RN, MSN, CDE Diabetes Coordinator Inpatient Glycemic Control Team Team Pager: [phone] (8a-5p)

## 2019-06-27 NOTE — Progress Notes (Signed)
Inpatient Rehab Admissions:  Inpatient Rehab Consult received. Note pt's positive COVID-19 test on 06/23/19.  Per CIR guidelines, pt will need to be greater than 20 days from initial positive with improving symptoms (May 13) or with two negative tests before he is a candidate for CIR.  Will follow from a distance for mobility progression.   Signed: Gayland Curry, [place], [place] Admissions Coordinator [phone]

## 2019-06-27 NOTE — Progress Notes (Signed)
PROGRESS NOTE    Kent [name]  DEY:[id number] DOB: [birth year] DOA: 06/22/2019 PCP: System, Pcp Not In    Brief Narrative: HPI: Kent [name] is a 64 y.o. 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  Pt had positive Covid19 test 4 days ago. He denies cough or shortness breath, but per RN in ED, pt has mild dry cough. No fever or chills. He states that he has been feeling weak and fatigue since then. He states tha0t he was asleep and when he woke up he feel weaker in left arm. He also states that he is having intermittent weakness in his legs bilaterally. He has dizziness when he stands up and walks. Per report, pt had possible slurry speech.  No vision loss or hearing loss. Pt reports incontinence of bladder and bowels.  No nausea, vomiting, or abdominal pain.  His son states that he had mild loose stool bowel movement at home.  Patient had oxygen desaturation to 86% on room air initially, currently 96% on room air.  Blood pressure 201/141, which improved to 123/93. Cardene gttt is ordered, but nopt started per RN in ED. HR 39 -108, RR21.  Chest x-ray showed right upper lobe patchy infiltration.  Patient is admitted to [facility] bed as inpatient.  4/22: Patient seen and examined.  Still very sleepy and lethargic.  Does awaken easily and answers all my questions appropriately.  Neurosurgery consulted for aneurysms noted on CT angio.  Further initial review of imaging no indication for intervention.  Aneurysms appear small.  Recommend follow-up for neck mass noted on CT.  4/23: Patient seen and examined.  Sleeping on my arrival but easily awakened.  Answers questions appropriately.  Patient has been seen by a oncology, neurology, neurosurgery.  Running  hypertensive, permissive in setting of stroke.  4/24: Patient seen and examined.  Easily awakened.  Continues to endorse fatigue.  Answers all questions appropriately.  Blood pressure improved over interval.  4/25: Patient seen and examined.  Sleeping but easily awakened.  Reports some improvement in his fatigue.  [employer] working with physical therapy.  Pending skilled nursing facility placement.    4/26: Patient seen and examined.  Sleeping but easily arousable.  Working actively with physical therapy.  Pending skilled nursing facility placement.  Assessment & Plan:   Principal Problem:   Stroke [facility]) Active Problems:   Hypertension   Hyperlipidemia   Hypokalemia   Hypertensive urgency   Acute respiratory failure with hypoxia (HCC)   Diabetes mellitus without complication (HCC)   Chronic diastolic CHF (congestive heart failure) (HCC)   Pneumonia due to COVID-19 virus  Stroke MRI of brain showed multifocal changes of acute/early subacute infarction within the right PCA territory involving the right occipital and temporal lobes.  Small amount of associated petechial hemorrhage within the right occipital lobe.  Acute/early subacute infarcts are also present within the right thalamus.   Neurology, Dr. Doy Mince is consulted. Plan: Echo with bubble, Ef 55%, no WMA, negative bubble Permissive hypertension for 48 hours. continue home ASA (pt was given 324 mg of ASA already) fasting lipid panel: no HLD noted  HbA1c 9.2, poor control of DM RN swallow screen, passed, diet advanced UDS: negative PT and OT consults: rec SNF  Hypertension and Hypertensive urgency Hypertensive urgency resolved Permissive hypertension allowed  for 48 hours Restarted home losartan Continue as needed IV hydralazine  Hyperlipidemia: - Lipitor 40 mg daily  Hypokalemia:  K= 3.2 on admission. - Repleted - Check Mg level: NL  Acute respiratory failure with hypoxia due to [place] due to COVID-19  virus  initially pt had oxygen desaturation to 86% on room air.   Chest x-ray showed right upper lobe patchy infiltration. Completed remdesivir - Solumedrol 40 mg bid (day 6/10) - vitamin C, zinc.  - Bronchodilators - PRN Mucinex for cough - f/u Blood culture - Follow inflammatory markers  Diabetes mellitus without complication ([place])  Most recent A1c 9.2, poorly controled.  Patient is taking Metformin at home - Lantus 8U qHS -SSI - Hold home metformin  Chronic diastolic CHF (congestive heart failure) ([place]) 2D echo on 01/09/2016 showed EF of 45-50%.   No leg edema or JVD.   CHF seems to be compensated.  History of SCC head and neck History of GIST Per patients son he has been lost to onc followup since 2017 CT angio neck demonstrates soft tissue mass concerning for tumor recurrence Patients last visit to oncology at [facility] 2017 Oncology reviewed CAT scan Concerning for recurrence Patient will need outpatient PET scan  DVT prophylaxis: lovenox Code Status: Partial Family Communication: Patient's son Glenden Rossell via phone [phone] on 06/27/2019 Disposition Plan: Status is: Inpatient  Remains inpatient appropriate because:Unsafe d/c plan   Dispo: The patient is from: Home              Anticipated d/c is to: SNF              Anticipated d/c date is: 1 day              Patient currently is medically stable to d/c.   Medically stable to discharge.  Needs SNF bed.   Consultants:   Neurology  NSG  Oncology   Procedures:   2D echo with bubble  Antimicrobials:   Remdesivir    Subjective: Seen and examined Lethargic but easily awakened No pain complaints  Objective: Vitals:   06/26/19 2005 06/27/19 0003 06/27/19 0332 06/27/19 0843  BP: (!) 134/102 129/75 137/82 104/85  Pulse: 82 61 77 72  Resp: 18 18 18 18   Temp:    97.9 F (36.6 C)  TempSrc:    Oral  SpO2: 97% 99% 99% 99%  Weight:      Height:        Intake/Output Summary (Last 24 hours) at  06/27/2019 1253 Last data filed at 06/27/2019 0931 Gross per 24 hour  Intake 120 ml  Output 575 ml  Net -455 ml   Filed Weights   06/22/19 0523  Weight: 81.6 kg    Examination:  General exam: Appears calm and comfortable  Respiratory system: Clear to auscultation. Respiratory effort normal. Cardiovascular system: S1 & S2 heard, RRR. No JVD, murmurs, rubs, gallops or clicks. No pedal edema. Gastrointestinal system: Abdomen is nondistended, soft and nontender. No organomegaly or masses felt. Normal bowel sounds heard. Central nervous system: Decreased muscle strength in left arm and left leg.  Alert oriented x3 Extremities: Symmetric 5 x 5 power. Skin: No rashes, lesions or ulcers Psychiatry: Judgement and insight appear normal. Mood & affect appropriate.     Data Reviewed: I have personally reviewed following labs and imaging studies  CBC: Recent Labs  Lab 06/23/19 0405 06/24/19 0513 06/25/19 0654 06/26/19 0507 06/27/19 0625  WBC 9.1 14.7* 15.1* 14.6* 15.8*  NEUTROABS 8.1* 13.1* 13.0* 12.8* 13.8*  HGB 14.9 14.7 14.6 14.4 15.5  HCT 42.3 40.4 41.3 39.6 43.7  MCV 83.1 81.1 82.8 80.7 81.8  PLT 347 408* 383 355 188   Basic Metabolic Panel: Recent Labs  Lab 06/23/19 0405 06/24/19 0513 06/25/19 0654 06/26/19 0507 06/27/19 0625  NA 135 135 133* 134* 135  K 3.6 3.5 3.9 3.5 3.5  CL 97* 96* 95* 98 96*  CO2 24 28 27 27 29   GLUCOSE 155* 185* 173* 189* 176*  BUN 10 18 23  27* 32*  CREATININE 0.99 1.03 1.13 1.01 1.03  CALCIUM 8.6* 8.8* 8.7* 8.4* 8.5*  MG 1.6*  --   --   --   --    GFR: Estimated Creatinine Clearance: 74.1 mL/min (by C-G formula based on SCr of 1.03 mg/dL). Liver Function Tests: Recent Labs  Lab 06/23/19 0405 06/24/19 0513 06/25/19 0654 06/26/19 0507 06/27/19 0625  AST 23 27 37 33 27  ALT 24 25 25 29  32  ALKPHOS 87 79 78 72 77  BILITOT 1.0 1.2 1.1 1.0 1.0  PROT 7.1 6.8 6.7 6.3* 7.1  ALBUMIN 3.8 3.9 3.8 3.6 4.0   No results for input(s):  LIPASE, AMYLASE in the last 168 hours. No results for input(s): AMMONIA in the last 168 hours. Coagulation Profile: Recent Labs  Lab 06/22/19 0600  INR 1.0   Cardiac Enzymes: No results for input(s): CKTOTAL, CKMB, CKMBINDEX, TROPONINI in the last 168 hours. BNP (last 3 results) No results for input(s): PROBNP in the last 8760 hours. HbA1C: No results for input(s): HGBA1C in the last 72 hours. CBG: Recent Labs  Lab 06/26/19 1159 06/26/19 1657 06/26/19 2134 06/27/19 0839 06/27/19 1202  GLUCAP 157* 273* 158* 254* 229*   Lipid Profile: No results for input(s): CHOL, HDL, LDLCALC, TRIG, CHOLHDL, LDLDIRECT in the last 72 hours. Thyroid Function Tests: No results for input(s): TSH, T4TOTAL, FREET4, T3FREE, THYROIDAB in the last 72 hours. Anemia Panel: Recent Labs    06/26/19 0507 06/27/19 0625  FERRITIN 340* 372*   Sepsis Labs: Recent Labs  Lab 06/22/19 0600 06/22/19 0655  PROCALCITON <0.10  --   LATICACIDVEN  --  1.4    Recent Results (from the past 240 hour(s))  Respiratory Panel by RT PCR (Flu A&B, Covid) - Urine, Clean Catch     Status: Abnormal   Collection Time: 06/22/19  5:10 AM   Specimen: Urine, Clean Catch  Result Value Ref Range Status   SARS Coronavirus 2 by RT PCR POSITIVE (A) NEGATIVE Final    Comment: RESULT CALLED TO, READ BACK BY AND VERIFIED WITH: GREG MOYER AT 4166 06/22/19 SDR (NOTE) SARS-CoV-2 target nucleic acids are DETECTED. SARS-CoV-2 RNA is generally detectable in upper respiratory specimens  during the acute phase of infection. Positive results are indicative of the presence of the identified virus, but do not rule out bacterial infection or co-infection with other pathogens not detected by the test. Clinical correlation with patient history and other diagnostic information is necessary to determine patient infection status. The expected result is Negative. Fact Sheet for Patients:  PinkCheek.be Fact Sheet  for Healthcare Providers: GravelBags.it This test is not yet approved or cleared by the [place] FDA and  has been authorized for detection and/or diagnosis of SARS-CoV-2 by FDA under an Emergency Use Authorization (EUA).  This EUA will remain in effect (meaning this test can be used) for th e duration of  the COVID-19 declaration under Section 564(b)(1) of the Act, 21 U.S.C. section 360bbb-3(b)(1), unless the authorization is  terminated or revoked sooner.    Influenza A by PCR NEGATIVE NEGATIVE Final   Influenza B by PCR NEGATIVE NEGATIVE Final    Comment: (NOTE) The Xpert Xpress SARS-CoV-2/FLU/RSV assay is intended as an aid in  the diagnosis of influenza from Nasopharyngeal swab specimens and  should not be used as a sole basis for treatment. Nasal washings and  aspirates are unacceptable for Xpert Xpress SARS-CoV-2/FLU/RSV  testing. Fact Sheet for Patients: PinkCheek.be Fact Sheet for Healthcare Providers: GravelBags.it This test is not yet approved or cleared by the [place] FDA and  has been authorized for detection and/or diagnosis of SARS-CoV-2 by  FDA under an Emergency Use Authorization (EUA). This EUA will remain  in effect (meaning this test can be used) for the duration of the  Covid-19 declaration under Section 564(b)(1) of the Act, 21  U.S.C. section 360bbb-3(b)(1), unless the authorization is  terminated or revoked. Performed at [facility], [address] [place], [place] [address]   Blood culture (routine x 2)     Status: None   Collection Time: 06/22/19  6:55 AM   Specimen: BLOOD  Result Value Ref Range Status   Specimen Description BLOOD LEFT [facility]  Final   Special Requests   Final    BOTTLES DRAWN AEROBIC AND ANAEROBIC Blood Culture adequate volume   Culture   Final    NO GROWTH 5 DAYS Performed at [facility], [address],  [place], [place] [address]    Report Status 06/27/2019 FINAL  Final  Blood culture (routine x 2)     Status: None   Collection Time: 06/22/19  6:55 AM   Specimen: BLOOD  Result Value Ref Range Status   Specimen Description BLOOD LEFT ARM  Final   Special Requests   Final    BOTTLES DRAWN AEROBIC AND ANAEROBIC Blood Culture adequate volume   Culture   Final    NO GROWTH 5 DAYS Performed at [facility], [address], [place], [place] [address]    Report Status 06/27/2019 FINAL  Final         Radiology Studies: No results found.      Scheduled Meds: . vitamin C  500 mg Oral Daily  . aspirin EC  81 mg Oral Daily  . atorvastatin  40 mg Oral Daily  . enoxaparin (LOVENOX) injection  40 mg Subcutaneous Q24H  . insulin aspart  0-5 Units Subcutaneous QHS  . insulin aspart  0-9 Units Subcutaneous TID WC  . insulin detemir  8 Units Subcutaneous QHS  . ipratropium  2 puff Inhalation Q4H  . losartan  50 mg Oral Daily  . methylPREDNISolone (SOLU-MEDROL) injection  40 mg Intravenous Q12H  . oxymetazoline  1 spray Each Nare BID  . zinc sulfate  220 mg Oral Daily   Continuous Infusions:    LOS: 5 days    Time spent: 35 min    Sidney Ace, MD [facility] Pager [phone]  If 7PM-7AM, please contact night-coverage 06/27/2019, 12:53 PM

## 2019-06-28 LAB — GLUCOSE, CAPILLARY
Glucose-Capillary: 133 mg/dL — ABNORMAL HIGH (ref 70–99)
Glucose-Capillary: 176 mg/dL — ABNORMAL HIGH (ref 70–99)
Glucose-Capillary: 115 mg/dL — ABNORMAL HIGH (ref 70–99)

## 2019-06-28 NOTE — Progress Notes (Signed)
Pt's sister called this RN for an update on the patients status. Per pt's request, this RN spoke with sister and gave her an update on pt condition/ difficulty with finding a safe place for patient to discharge to. Sister asked if Pt could possibly qualify for Medicare. Pt states that should was going to allow patient to come stay with her but then declined him coming to her house after understanding how much physical help he will need at home and that he is still on quarantine for COVID. MD and social work notified of conversation.

## 2019-06-28 NOTE — Progress Notes (Signed)
Occupational Therapy Treatment Patient Details Name: Kent [name] MRN: [id number] DOB: June 01, 1955 Today's Date: 06/28/2019    History of present illness Kent [name] is an 64 y.o. 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  Patient left the ED, but later returned via [facility] with left extremity weakness. MRI of the brain showed acute/subacute infarcts in the right occipital, right temporal and right thalamic regions.  Some associated petechial hemorrhage noted.  Follow up CTA shows multiple areas of stenosis/occlusion including left vertebral origin moderate to severe, right PCA occlusion, left PCA severe, proximal right M2 moderate to severe.  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   OT comments  Pt seen for OT tx this date to f/u re: safety with self care ADLs/ADL mobility. Pt noted to have dysmetria effecting ability to safely/I'ly complete self care ADLs including UB dsg (req'ed MOD A) and LB dsg (req'ed MIN A). In addition, pt with some impulsivity and decreased balance, requiring 2p MIN assist for sit to stand t/f with RW and MIN/MOD A +2 to sustain static standing balance (heavy L lateral lean). Pt not safe to return to home environment/PLOF from OT standpoint based on current assessment. Discharge recommendation updated to CIR to reflect pt's current fxl status relative to PLOF. Pt with good tolerance for session. Will continue to follow.    Follow Up Recommendations  CIR    Equipment Recommendations  3 in 1 bedside commode    Recommendations for Other Services      Precautions / Restrictions Precautions Precautions: Fall Precaution Comments: High fall Restrictions Weight  Bearing Restrictions: No Other Position/Activity Restrictions: extensor tone L, orthostatic BP       Mobility Bed Mobility Overal bed mobility: Needs Assistance Bed Mobility: Supine to Sit;Sit to Supine     Supine to sit: Min assist;HOB elevated Sit to supine: Min assist;Mod assist;+2 for safety/equipment   General bed mobility comments: Pt requires cues to sequence seated scooting toward [facility] prior to performing sit to sup. Impulsively lays down prematurely requiring assist and then MOD +2 with use of draw sheet for propulsion towards HOB d/t difficulty coordinating RLE/LLE simultaneously.  Transfers Overall transfer level: Needs assistance Equipment used: Rolling walker (2 wheeled) Transfers: Sit to/from Stand Sit to Stand: Min assist;+2 safety/equipment         General transfer comment: Pt with good strength to push to stand, but poor balance/stability requiring 2p assist for safety. Orthostatics taken-BP laying: 165/120, BP sitting: 131/98, 141/121 after ~2 mins, BP standing: unable/pt unstable, BP sitting after stand: 156/99    Balance Overall balance assessment: Needs assistance Sitting-balance support: Feet supported;Single extremity supported Sitting balance-Leahy Scale: Poor Sitting balance - Comments: intermittent lateral/posterior lean/seated LOB Postural control: Posterior lean;Left lateral lean Standing balance support: During functional activity;Single extremity supported Standing balance-Leahy Scale: Poor Standing balance comment: Pt requires heavy assist on L side to prevent LOB. 2p assist this date to sustain static balance as well as B UE support on RW.                           ADL either performed or assessed with clinical judgement   ADL Overall ADL's : Needs assistance/impaired  General ADL Comments: Pt noted to have period of urinary incontinence with soiled sheets and clothing. Pt  requires CGA for lateral rolling for bed change, MOD A with UB dressing to thread L UE through sleeve d/t decreased sensation and what appears to be decreased vision in L hemisphere. Pt able to pull up R sock at bed level I'ly, attempts to adjust L I'ly, but with appearance of dysmetria while coordinating movements to reach hand to foot.     Vision Patient Visual Report: Other (comment)(pt appears to have decreased vision in L hemisphere, unable to detect how many fingers this therapist holds up in his left visual field.)     Perception     Praxis      Cognition Arousal/Alertness: Awake/alert Behavior During Therapy: [facility] for tasks assessed/performed;Impulsive Overall Cognitive Status: Impaired/Different from baseline Area of Impairment: Problem solving;Following commands;Safety/judgement;Awareness                       Following Commands: Follows one step commands with increased time;Follows one step commands consistently;Follows multi-step commands inconsistently Safety/Judgement: Decreased awareness of safety;Decreased awareness of deficits   Problem Solving: Slow processing;Difficulty sequencing;Requires verbal cues;Requires tactile cues General Comments: some impulsivity noted with sup to sit and sit to stand. Pt very motivated, but requires cues for safe hand placement and to await gait belt/RW to stand.        Exercises Other Exercises Other Exercises: OT engages pt in hemi dressing technique of L UE, but pt with poor reception of technique without MOD multimodal cues. Other Exercises: Orthostatics (+)=BP laying: 165/120, BP sitting: 131/98, 141/121 after ~2 mins, BP standing: unable/pt unstable, BP sitting after stand: 156/99   Shoulder Instructions       General Comments      Pertinent Vitals/ Pain       Pain Assessment: No/denies pain  Home Living                                          Prior Functioning/Environment               Frequency  Min 3X/week        Progress Toward Goals  OT Goals(current goals can now be found in the care plan section)  Progress towards OT goals: OT to reassess next treatment(some waxing/waning)  Acute Rehab OT Goals Patient Stated Goal: I want to go home" OT Goal Formulation: With patient Time For Goal Achievement: 07/06/19 Potential to Achieve Goals: [place] Discharge plan needs to be updated;Frequency needs to be updated    Co-evaluation    PT/OT/SLP Co-Evaluation/Treatment: Yes Reason for Co-Treatment: Complexity of the patient's impairments (multi-system involvement);Necessary to address cognition/behavior during functional activity;For patient/therapist safety;To address functional/ADL transfers PT goals addressed during session: Mobility/safety with mobility;Balance;Proper use of DME OT goals addressed during session: ADL's and self-care;Proper use of Adaptive equipment and DME      AM-PAC OT "6 Clicks" Daily Activity     Outcome Measure   Help from another person eating meals?: A Little Help from another person taking care of personal grooming?: A Little Help from another person toileting, which includes using toliet, bedpan, or urinal?: A Lot Help from another person bathing (including washing, rinsing, drying)?: A Lot Help from another person to put on and taking off regular upper body clothing?: A Little Help from another person to put  on and taking off regular lower body clothing?: A Lot 6 Click Score: 15    End of Session Equipment Utilized During Treatment: Gait belt;Rolling walker  OT Visit Diagnosis: Other abnormalities of gait and mobility (R26.89);Hemiplegia and hemiparesis Hemiplegia - Right/Left: Left Hemiplegia - dominant/non-dominant: Dominant Hemiplegia - caused by: Cerebral infarction   Activity Tolerance Patient tolerated treatment well   Patient Left in bed;with call bell/phone within reach;with bed alarm set   Nurse Communication  Mobility status        Time: 6484-7207 OT Time Calculation (min): 35 min  Charges: OT General Charges $OT Visit: 1 Visit OT Treatments $Self Care/Home Management : 8-22 mins  Gerrianne Scale, MS, OTR/L ascom [phone] 06/28/19, 12:19 PM

## 2019-06-28 NOTE — TOC Progression Note (Addendum)
Transition of Care [facility]) - Progression Note    Patient Details  Name: ANDER WAMSER MRN: [id number] Date of Birth: 11-Feb-1956  Transition of Care [facility] CM/SW Contact  Finnigan Warriner, Gardiner Rhyme, LCSW Phone Number: 06/28/2019, 2:00 PM  Clinical Narrative:  According to therapists he is not safe to go home with his wife who is recovering from [place] herself. Son is in the home but works. With pt being uninsured really no options. Wife feels we should figure it out. But has not answered calls past two days. Left messages for her and spoke with son who refers to Mom. He is not eligible for CIR until 5/13 due to COVID status. Will work on a safe discharge for pt.    Expected Discharge Plan: [place] Barriers to Discharge: Continued Medical Work up  Expected Discharge Plan and Services Expected Discharge Plan: [place] In-house Referral: Clinical Social Work   Post Acute Care Choice: [place] arrangements for the past 2 months: Single Family Home                                       Social Determinants of Health (SDOH) Interventions    Readmission Risk Interventions No flowsheet data found.

## 2019-06-28 NOTE — Progress Notes (Addendum)
Physical Therapy Treatment Patient Details Name: Kent [name] MRN: [id number] DOB: [birth year] Today's Date: 06/28/2019  PT/OT both reach pt's son and discussed progress and safety concerns with pt D/C home. Son reports that pt lives with his wife however she is not able to safely care for him. She has recent COVID admit and back problems. Discussed the need for someone to be available to assist pt at home for safety. We also discussed pt's impulsivity and balance concerns. Son reports understanding and is discussing with other family members this evening if they can assist. He states he will reach back out to this author next date.                         Julaine Fusi PTA 06/28/19, 4:29 PM

## 2019-06-28 NOTE — NC FL2 (Signed)
[place] LEVEL OF CARE SCREENING TOOL     IDENTIFICATION  Patient Name: Kent [name] Birthdate: 1955-06-03 Sex: male Admission Date (Current Location): 06/22/2019  [place] and [place] Number:  Engineering geologist and Address:  [facility], [address], [place], [place] [address]      Provider Number: [id number]  Attending Physician Name and Address:  Sidney Ace, MD  Relative Name and Phone Number:  Nathen Balaban [phone]    Current Level of Care: Hospital Recommended Level of Care: [place] Prior Approval Number:    Date Approved/Denied:   PASRR Number: [id number]  Discharge Plan: SNF    Current Diagnoses: Patient Active Problem List   Diagnosis Date Noted  . Generalized weakness 06/22/2019  . Hypertensive urgency 06/22/2019  . Stroke ([place]) 06/22/2019  . Acute respiratory failure with hypoxia ([place]) 06/22/2019  . Diabetes mellitus without complication ([place]) 99/24/2683  . Chronic diastolic CHF (congestive heart failure) ([place]) 06/22/2019  . Pneumonia due to COVID-19 virus 06/22/2019  . Left arm weakness 06/22/2019  . Pulmonary nodules 07/12/2016  . H/O necrotizing fasciitis 05/18/2016  . Oral pain of unknown etiology 05/18/2016  . H/O osteomyelitis 05/18/2016  . Sepsis ([place]) 01/08/2016  . Advance care planning 01/07/2016  . Tachycardia 01/07/2016  . Dry gangrene ([place]) 01/04/2016  . Hypokalemia 12/21/2015  . Protein-calorie malnutrition, severe 12/11/2015  . Electrolyte imbalance 12/11/2015  . Intractable nausea and vomiting 12/10/2015  . Dehydration 11/16/2015  . Odynophagia 11/16/2015  . Anemia 11/16/2015  . Thrombocytopenia ([place]) 11/16/2015  . Hyponatremia 11/12/2015  . Hypomagnesemia 11/12/2015  . Cancer associated pain 10/22/2015  . Carcinoma of base of tongue ([place]) 09/10/2015  . Low back pain 04/06/2015  . Hypertension 12/05/2014  . Hypertensive CKD (chronic kidney disease)  12/05/2014  . Hyperlipidemia 12/05/2014  . Class II obesity 12/05/2014  . Erectile dysfunction 12/05/2014  . Nonalcoholic steatohepatitis 41/96/2229    Orientation RESPIRATION BLADDER Height & Weight     Self, Time, Situation, Place  Normal Incontinent Weight: 180 lb (81.6 kg) Height:  5\' 7"  (170.2 cm)  BEHAVIORAL SYMPTOMS/MOOD NEUROLOGICAL BOWEL NUTRITION STATUS      Continent Diet(heart healthy thin liquids)  AMBULATORY STATUS COMMUNICATION OF NEEDS Skin   Extensive Assist Verbally Normal                       Personal Care Assistance Level of Assistance  Bathing, Dressing, Feeding Bathing Assistance: Limited assistance Feeding assistance: Limited assistance Dressing Assistance: Limited assistance     Functional Limitations Info             SPECIAL CARE FACTORS FREQUENCY  PT (By licensed PT), OT (By licensed OT)     PT Frequency: 5x week OT Frequency: 5x week            Contractures Contractures Info: Not present    Additional Factors Info  Code Status, Allergies, Isolation Precautions Code Status Info: Partial Code Allergies Info: No known allergies     Isolation Precautions Info: COVID  06/21/2019     Current Medications (06/28/2019):  This is the current hospital active medication list Current Facility-Administered Medications  Medication Dose Route Frequency Provider Last Rate Last Admin  . acetaminophen (TYLENOL) tablet 650 mg  650 mg Oral Q6H PRN Ivor Costa, MD   650 mg at 06/27/19 0524  . albuterol (VENTOLIN HFA) 108 (90 Base) MCG/ACT inhaler 2 puff  2 puff Inhalation Q4H PRN Ivor Costa, MD      .  ascorbic acid (VITAMIN C) tablet 500 mg  500 mg Oral Daily Ivor Costa, MD   500 mg at 06/28/19 1014  . aspirin EC tablet 81 mg  81 mg Oral Daily Ivor Costa, MD   81 mg at 06/28/19 1013  . atorvastatin (LIPITOR) tablet 40 mg  40 mg Oral Daily Ivor Costa, MD   40 mg at 06/28/19 1014  . dextromethorphan-guaiFENesin (MUCINEX DM) 30-600 MG per 12 hr tablet  1 tablet  1 tablet Oral BID PRN Ivor Costa, MD   1 tablet at 06/26/19 0939  . enoxaparin (LOVENOX) injection 40 mg  40 mg Subcutaneous Q24H Ivor Costa, MD   40 mg at 06/28/19 1014  . hydrALAZINE (APRESOLINE) injection 5 mg  5 mg Intravenous Q2H PRN Ivor Costa, MD      . insulin aspart (novoLOG) injection 0-5 Units  0-5 Units Subcutaneous QHS Ivor Costa, MD      . insulin aspart (novoLOG) injection 0-9 Units  0-9 Units Subcutaneous TID WC Ivor Costa, MD   1 Units at 06/28/19 1013  . insulin detemir (LEVEMIR) injection 8 Units  8 Units Subcutaneous QHS Ralene Muskrat B, MD   8 Units at 06/27/19 2228  . ipratropium (ATROVENT HFA) inhaler 2 puff  2 puff Inhalation Q4H Ivor Costa, MD   2 puff at 06/28/19 1318  . losartan (COZAAR) tablet 50 mg  50 mg Oral Daily Sreenath, Sudheer B, MD   50 mg at 06/28/19 1014  . meclizine (ANTIVERT) tablet 12.5 mg  12.5 mg Oral TID PRN Mansy, Jan A, MD   12.5 mg at 06/28/19 1014  . methylPREDNISolone sodium succinate (SOLU-MEDROL) 40 mg/mL injection 40 mg  40 mg Intravenous Q12H Ivor Costa, MD   40 mg at 06/28/19 1013  . ondansetron (ZOFRAN) injection 4 mg  4 mg Intravenous Q8H PRN Ivor Costa, MD      . oxymetazoline (AFRIN) 0.05 % nasal spray 1 spray  1 spray Each Nare BID Ralene Muskrat B, MD   1 spray at 06/28/19 1015  . senna-docusate (Senokot-S) tablet 1 tablet  1 tablet Oral QHS PRN Ivor Costa, MD   1 tablet at 06/28/19 1013  . traMADol (ULTRAM) tablet 50 mg  50 mg Oral Q6H PRN Sharion Settler, NP   50 mg at 06/27/19 0630  . zinc sulfate capsule 220 mg  220 mg Oral Daily Ivor Costa, MD   220 mg at 06/28/19 1014     Discharge Medications: Please see discharge summary for a list of discharge medications.  Relevant Imaging Results:  Relevant Lab Results:   Additional Information SSN: [id number]  Medora Roorda, Gardiner Rhyme, LCSW

## 2019-06-28 NOTE — Progress Notes (Signed)
Physical Therapy Treatment Patient Details Name: Kent [name] MRN: [id number] DOB: Jan 20, 1956 Today's Date: 06/28/2019    History of Present Illness Kent [name] is an 64 y.o. 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  Patient left the ED, but later returned [facility] with left extremity weakness. MRI of the brain showed acute/subacute infarcts in the right occipital, right temporal and right thalamic regions.  Some associated petechial hemorrhage noted.  Follow up CTA shows multiple areas of stenosis/occlusion including left vertebral origin moderate to severe, right PCA occlusion, left PCA severe, proximal right M2 moderate to severe.  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    PT Comments    PT/OT co treat 2/2 to complexity of pt case and need for +2 assistance for safety. Pt is alert and mostly oriented however is impulsive with poor insight of deficits. He is very motivated and cooperative. " I just want to go home." Mentioned several times throughout session wanting to DC to home. supine BP 165/120. Upon sitting up EOB via min assist + increased time. BP dropped 131/98. Pt was asymptomatic. ~ 2 minutes later BP 141/121. Pt has severe L neglect with decreased sensation and poor vision to left. Pt was incontinent of urine and therapist assisted with bed linen change and cleaning pt. Pt did stand EOB with severe left lateral lean. Unsafe to progress to gait 2/2 to poor static balance.  Pt will need extensive skilled PT at DC. If pt is to DC to home, he will require WC for safety for pt is unable to transfer or ambulate safely at this time. He continues to be high fall risk due to deficits  with strength, balance, coordination and L neglect.PT recommends DC to CIR to address deficits and assist pot with returning to PLOF. Acute PT will continue to follow per POC.     Follow Up Recommendations  CIR;Supervision/Assistance - 24 hour;Supervision for mobility/OOB     Equipment Recommendations  Rolling walker with 5" wheels;3in1 (PT);Wheelchair (measurements PT);Wheelchair cushion (measurements PT)    Recommendations for Other Services       Precautions / Restrictions Precautions Precautions: Fall Precaution Comments: High fall Restrictions Weight Bearing Restrictions: No Other Position/Activity Restrictions: extensor tone L, orthostatic BP    Mobility  Bed Mobility Overal bed mobility: Needs Assistance Bed Mobility: Supine to Sit;Sit to Supine   Sidelying to sit: Min assist Supine to sit: Min assist;HOB elevated Sit to supine: Min assist;Mod assist;+2 for safety/equipment Sit to sidelying: +2 for safety/equipment General bed mobility comments: Pt requires cues to sequence seated scooting toward [facility] prior to performing sit to sup. Impulsively lays down prematurely requiring assist and then MOD +2 with use of draw sheet for propulsion towards HOB d/t difficulty coordinating RLE/LLE simultaneously.  Transfers Overall transfer level: Needs assistance Equipment used: Rolling walker (2 wheeled) Transfers: Sit to/from Stand Sit to Stand: Min assist;+2 safety/equipment         General transfer comment: Pt with good strength to push to stand, but poor balance/stability requiring 2p assist for safety. Orthostatics taken-BP laying: 165/120, BP sitting: 131/98, 141/121 after ~2 mins, BP standing: unable/pt unstable, BP sitting after stand: 156/99  Ambulation/Gait  General Gait Details: unable to safely progress   Stairs             Wheelchair Mobility    Modified Rankin (Stroke Patients Only)       Balance Overall balance assessment: Needs  assistance Sitting-balance support: Feet supported;Single extremity supported Sitting balance-Leahy Scale: Poor Sitting balance - Comments: intermittent lateral/posterior lean/seated LOB Postural control: Posterior lean;Left lateral lean Standing balance support: During functional activity;Single extremity supported Standing balance-Leahy Scale: Poor Standing balance comment: Pt requires heavy assist on L side to prevent LOB. 2p assist this date to sustain static balance as well as B UE support on RW.                            Cognition Arousal/Alertness: Awake/alert Behavior During Therapy: WFL for tasks assessed/performed;Impulsive Overall Cognitive Status: Impaired/Different from baseline Area of Impairment: Problem solving;Following commands;Safety/judgement;Awareness                       Following Commands: Follows one step commands with increased time;Follows one step commands consistently;Follows multi-step commands inconsistently Safety/Judgement: Decreased awareness of safety;Decreased awareness of deficits Awareness: Intellectual Problem Solving: Slow processing;Difficulty sequencing;Requires verbal cues;Requires tactile cues General Comments: some impulsivity noted with sup to sit and sit to stand. Pt very motivated, but requires cues for safe hand placement and to await gait belt/RW to stand.      Exercises Other Exercises Other Exercises: OT engages pt in hemi dressing technique of L UE, but pt with poor reception of technique without MOD multimodal cues. Other Exercises: Orthostatics (+)=BP laying: 165/120, BP sitting: 131/98, 141/121 after ~2 mins, BP standing: unable/pt unstable, BP sitting after stand: 156/99    General Comments        Pertinent Vitals/Pain Pain Assessment: No/denies pain    Home Living                      Prior Function            PT Goals (current goals can now be found in the care plan section) Acute Rehab  PT Goals Patient Stated Goal: I want to go home" Progress towards PT goals: Progressing toward goals    Frequency    7X/week      PT Plan Current plan remains appropriate    Co-evaluation   Reason for Co-Treatment: Complexity of the patient's impairments (multi-system involvement);Necessary to address cognition/behavior during functional activity;For patient/therapist safety;To address functional/ADL transfers PT goals addressed during session: Mobility/safety with mobility;Balance;Proper use of DME OT goals addressed during session: ADL's and self-care;Proper use of Adaptive equipment and DME      AM-PAC PT "6 Clicks" Mobility   Outcome Measure  Help needed turning from your back to your side while in a flat bed without using bedrails?: A Little Help needed moving from lying on your back to sitting on the side of a flat bed without using bedrails?: A Little Help needed moving to and from a bed to a chair (including a wheelchair)?: A Lot Help needed standing up from a chair using your arms (e.g., wheelchair or bedside chair)?: A Lot Help needed to walk in hospital room?: Total Help needed climbing 3-5 steps with a railing? : Total 6 Click Score: 12    End of Session Equipment Utilized During Treatment: Gait belt Activity Tolerance: Patient limited by fatigue Patient left: in bed;with bed alarm set;with call bell/phone within reach  Nurse Communication: Mobility status PT Visit Diagnosis: Unsteadiness on feet (R26.81);Difficulty in walking, not elsewhere classified (R26.2);Hemiplegia and hemiparesis Hemiplegia - Right/Left: Left Hemiplegia - dominant/non-dominant: Dominant Hemiplegia - caused by: Cerebral infarction     Time: 7510-2585 PT Time Calculation (min) (ACUTE ONLY): 35 min  Charges:  $Neuromuscular Re-education: 8-22 mins                    Julaine Fusi PTA 06/28/19, 1:43 PM

## 2019-06-28 NOTE — Progress Notes (Signed)
PROGRESS NOTE    Kent [name]  NOM:[id number] DOB: January 07, 1956 DOA: 06/22/2019 PCP: System, Pcp Not In    Brief Narrative: HPI: Kent [name] is a 64 y.o. 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  Pt had positive Covid19 test 4 days ago. He denies cough or shortness breath, but per RN in ED, pt has mild dry cough. No fever or chills. He states that he has been feeling weak and fatigue since then. He states tha0t he was asleep and when he woke up he feel weaker in left arm. He also states that he is having intermittent weakness in his legs bilaterally. He has dizziness when he stands up and walks. Per report, pt had possible slurry speech.  No vision loss or hearing loss. Pt reports incontinence of bladder and bowels.  No nausea, vomiting, or abdominal pain.  His son states that he had mild loose stool bowel movement at home.  Patient had oxygen desaturation to 86% on room air initially, currently 96% on room air.  Blood pressure 201/141, which improved to 123/93. Cardene gttt is ordered, but nopt started per RN in ED. HR 39 -108, RR21.  Chest x-ray showed right upper lobe patchy infiltration.  Patient is admitted to [place] bed as inpatient.  4/22: Patient seen and examined.  Still very sleepy and lethargic.  Does awaken easily and answers all my questions appropriately.  Neurosurgery consulted for aneurysms noted on CT angio.  Further initial review of imaging no indication for intervention.  Aneurysms appear small.  Recommend follow-up for neck mass noted on CT.  4/23: Patient seen and examined.  Sleeping on my arrival but easily awakened.  Answers questions appropriately.  Patient has been seen by a oncology, neurology, neurosurgery.  Running  hypertensive, permissive in setting of stroke.  4/24: Patient seen and examined.  Easily awakened.  Continues to endorse fatigue.  Answers all questions appropriately.  Blood pressure improved over interval.  4/25: Patient seen and examined.  Sleeping but easily awakened.  Reports some improvement in his fatigue.  [place] working with physical therapy.  Pending skilled nursing facility placement.    4/26: Patient seen and examined.  Sleeping but easily arousable.  Working actively with physical therapy.  Pending skilled nursing facility placement.  4/27: Patient seen and examined.  Still lethargic with significant unilateral weakness.   Lack of safe discharge plan at this time.   Assessment & Plan:   Principal Problem:   Stroke [facility]) Active Problems:   Hypertension   Hyperlipidemia   Hypokalemia   Hypertensive urgency   Acute respiratory failure with hypoxia (HCC)   Diabetes mellitus without complication (HCC)   Chronic diastolic CHF (congestive heart failure) (HCC)   Pneumonia due to COVID-19 virus  Stroke MRI of brain showed multifocal changes of acute/early subacute infarction within the right PCA territory involving the right occipital and temporal lobes.  Small amount of associated petechial hemorrhage within the right occipital lobe.  Acute/early subacute infarcts are also present within the right thalamus.   Neurology, Dr. Doy Mince is consulted. Plan: Echo with bubble, Ef 55%, no WMA, negative bubble Permissive hypertension for 48 hours. continue home ASA (pt was given 324 mg of ASA already) fasting lipid panel: no HLD noted  HbA1c 9.2, poor control of DM RN  swallow screen, passed, diet advanced UDS: negative PT and OT consults: rec SNF.  Unfortunately after discussion with case management appears patient is uninsured and thus SNF may not be a possibility.  We attempted to refer to CIR however due to CIR's policies patient need to be 20 days out from a positive Covid test  which would mean the earliest the patient can dispo to CIR would be Jul 19, 2019.  After reevaluation by physical therapy and Occupational Therapy they feel that discharge home is not a safe option.  I discussed this over the phone with the wife who stated that she was not informed of any of this.  I told her that I had been in close contact with the patient's son [name] and the case management had also been talking to them.  Unclear to me why wife was not aware of the need this information.  I have asked case management to be in contact with the wife and explained that options are greatly limited in this case.  Case management will continue to work on a safe discharge plan for patient however we may be in a situation where we are forced to discharge this patient home with home health services.  Hypertension and Hypertensive urgency Hypertensive urgency resolved Permissive hypertension allowed for 48 hours Restarted home losartan Continue as needed IV hydralazine  Hyperlipidemia: - Lipitor 40 mg daily  Hypokalemia:  K= 3.2 on admission. - Repleted - Check Mg level: NL  Acute respiratory failure with hypoxia due to [place] due to COVID-19 virus  initially pt had oxygen desaturation to 86% on room air.   Chest x-ray showed right upper lobe patchy infiltration. Completed remdesivir - Solumedrol 40 mg bid (day 7/10) - vitamin C, zinc.  - Bronchodilators - PRN Mucinex for cough - f/u Blood culture - Follow inflammatory markers  Diabetes mellitus without complication ([place])  Most recent A1c 9.2, poorly controled.  Patient is taking Metformin at home - Lantus 8U qHS -SSI - Hold home metformin  Chronic diastolic CHF (congestive heart failure) ([place]) 2D echo on 01/09/2016 showed EF of 45-50%.   No leg edema or JVD.   CHF seems to be compensated.  History of SCC head and neck History of GIST Per patients son he has been lost to onc followup since 2017 CT angio neck demonstrates  soft tissue mass concerning for tumor recurrence Patients last visit to oncology at [facility] 2017 Oncology reviewed CAT scan Concerning for recurrence Patient will need outpatient PET scan  DVT prophylaxis: lovenox Code Status: Partial Family Communication: Patient's son Read Bonelli via phone [phone] on 06/27/2019.  Patient's wife [name] via phone [phone] on 4/27 Disposition Plan: Status is: Inpatient  Remains inpatient appropriate because:Unsafe d/c plan   Dispo: The patient is from: Home              Anticipated d/c is to: SNF              Anticipated d/c date is: 1 day              Patient currently is medically stable to d/c.   See above assessment and plan.  From medical standpoint patient can discharge however there is lack of a safe discharge plan here.  Unfortunately the patient is uninsured this will likely not be accepted to a skilled nursing facility bed.  CIR is unable to take him due to Covid positive status.  Physical therapy and Occupational Therapy are both stated that home health  is likely not a safe dispo plan.  We are limited in options.  Case management looking for safe discharge plan.   Consultants:   Neurology  [place]  Oncology   Procedures:   2D echo with bubble  Antimicrobials:   Remdesivir    Subjective: Seen and examined Lethargic but easily awakened No pain complaints  Objective: Vitals:   06/27/19 0843 06/27/19 1618 06/28/19 0146 06/28/19 0808  BP: 104/85 (!) 149/82 (!) 150/91 127/88  Pulse: 72 80 (!) 56 74  Resp: 18 20 14 20   Temp: 97.9 F (36.6 C) 98.2 F (36.8 C) 98.8 F (37.1 C) 98.2 F (36.8 C)  TempSrc: Oral Oral Oral Oral  SpO2: 99% 95% 96% 95%  Weight:      Height:        Intake/Output Summary (Last 24 hours) at 06/28/2019 1356 Last data filed at 06/28/2019 0400 Gross per 24 hour  Intake --  Output 575 ml  Net -575 ml   Filed Weights   06/22/19 0523  Weight: 81.6 kg    Examination:  General exam: Appears  calm and comfortable  Respiratory system: Clear to auscultation. Respiratory effort normal. Cardiovascular system: S1 & S2 heard, RRR. No JVD, murmurs, rubs, gallops or clicks. No pedal edema. Gastrointestinal system: Abdomen is nondistended, soft and nontender. No organomegaly or masses felt. Normal bowel sounds heard. Central nervous system: Decreased muscle strength in left arm and left leg.  Alert oriented x3 Extremities: Symmetric 5 x 5 power. Skin: No rashes, lesions or ulcers Psychiatry: Judgement and insight appear normal. Mood & affect appropriate.     Data Reviewed: I have personally reviewed following labs and imaging studies  CBC: Recent Labs  Lab 06/23/19 0405 06/24/19 0513 06/25/19 0654 06/26/19 0507 06/27/19 0625  WBC 9.1 14.7* 15.1* 14.6* 15.8*  NEUTROABS 8.1* 13.1* 13.0* 12.8* 13.8*  HGB 14.9 14.7 14.6 14.4 15.5  HCT 42.3 40.4 41.3 39.6 43.7  MCV 83.1 81.1 82.8 80.7 81.8  PLT 347 408* 383 355 696   Basic Metabolic Panel: Recent Labs  Lab 06/23/19 0405 06/24/19 0513 06/25/19 0654 06/26/19 0507 06/27/19 0625  NA 135 135 133* 134* 135  K 3.6 3.5 3.9 3.5 3.5  CL 97* 96* 95* 98 96*  CO2 24 28 27 27 29   GLUCOSE 155* 185* 173* 189* 176*  BUN 10 18 23  27* 32*  CREATININE 0.99 1.03 1.13 1.01 1.03  CALCIUM 8.6* 8.8* 8.7* 8.4* 8.5*  MG 1.6*  --   --   --   --    GFR: Estimated Creatinine Clearance: 74.1 mL/min (by C-G formula based on SCr of 1.03 mg/dL). Liver Function Tests: Recent Labs  Lab 06/23/19 0405 06/24/19 0513 06/25/19 0654 06/26/19 0507 06/27/19 0625  AST 23 27 37 33 27  ALT 24 25 25 29  32  ALKPHOS 87 79 78 72 77  BILITOT 1.0 1.2 1.1 1.0 1.0  PROT 7.1 6.8 6.7 6.3* 7.1  ALBUMIN 3.8 3.9 3.8 3.6 4.0   No results for input(s): LIPASE, AMYLASE in the last 168 hours. No results for input(s): AMMONIA in the last 168 hours. Coagulation Profile: Recent Labs  Lab 06/22/19 0600  INR 1.0   Cardiac Enzymes: No results for input(s): CKTOTAL,  CKMB, CKMBINDEX, TROPONINI in the last 168 hours. BNP (last 3 results) No results for input(s): PROBNP in the last 8760 hours. HbA1C: No results for input(s): HGBA1C in the last 72 hours. CBG: Recent Labs  Lab 06/27/19 0839 06/27/19 1202 06/27/19 1729 06/27/19  2106 06/28/19 0805  GLUCAP 254* 229* 145* 160* 133*   Lipid Profile: No results for input(s): CHOL, HDL, LDLCALC, TRIG, CHOLHDL, LDLDIRECT in the last 72 hours. Thyroid Function Tests: No results for input(s): TSH, T4TOTAL, FREET4, T3FREE, THYROIDAB in the last 72 hours. Anemia Panel: Recent Labs    06/26/19 0507 06/27/19 0625  FERRITIN 340* 372*   Sepsis Labs: Recent Labs  Lab 06/22/19 0600 06/22/19 0655  PROCALCITON <0.10  --   LATICACIDVEN  --  1.4    Recent Results (from the past 240 hour(s))  Respiratory Panel by RT PCR (Flu A&B, Covid) - Urine, Clean Catch     Status: Abnormal   Collection Time: 06/22/19  5:10 AM   Specimen: Urine, Clean Catch  Result Value Ref Range Status   SARS Coronavirus 2 by RT PCR POSITIVE (A) NEGATIVE Final    Comment: RESULT CALLED TO, READ BACK BY AND VERIFIED WITH: GREG MOYER AT 2426 06/22/19 SDR (NOTE) SARS-CoV-2 target nucleic acids are DETECTED. SARS-CoV-2 RNA is generally detectable in upper respiratory specimens  during the acute phase of infection. Positive results are indicative of the presence of the identified virus, but do not rule out bacterial infection or co-infection with other pathogens not detected by the test. Clinical correlation with patient history and other diagnostic information is necessary to determine patient infection status. The expected result is Negative. Fact Sheet for Patients:  PinkCheek.be Fact Sheet for Healthcare Providers: GravelBags.it This test is not yet approved or cleared by the [place] FDA and  has been authorized for detection and/or diagnosis of SARS-CoV-2 by FDA  under an Emergency Use Authorization (EUA).  This EUA will remain in effect (meaning this test can be used) for th e duration of  the COVID-19 declaration under Section 564(b)(1) of the Act, 21 U.S.C. section 360bbb-3(b)(1), unless the authorization is terminated or revoked sooner.    Influenza A by PCR NEGATIVE NEGATIVE Final   Influenza B by PCR NEGATIVE NEGATIVE Final    Comment: (NOTE) The Xpert Xpress SARS-CoV-2/FLU/RSV assay is intended as an aid in  the diagnosis of influenza from Nasopharyngeal swab specimens and  should not be used as a sole basis for treatment. Nasal washings and  aspirates are unacceptable for Xpert Xpress SARS-CoV-2/FLU/RSV  testing. Fact Sheet for Patients: PinkCheek.be Fact Sheet for Healthcare Providers: GravelBags.it This test is not yet approved or cleared by the [place] FDA and  has been authorized for detection and/or diagnosis of SARS-CoV-2 by  FDA under an Emergency Use Authorization (EUA). This EUA will remain  in effect (meaning this test can be used) for the duration of the  Covid-19 declaration under Section 564(b)(1) of the Act, 21  U.S.C. section 360bbb-3(b)(1), unless the authorization is  terminated or revoked. Performed at [facility], [address] [place], [place] [address]   Blood culture (routine x 2)     Status: None   Collection Time: 06/22/19  6:55 AM   Specimen: BLOOD  Result Value Ref Range Status   Specimen Description BLOOD LEFT [facility]  Final   Special Requests   Final    BOTTLES DRAWN AEROBIC AND ANAEROBIC Blood Culture adequate volume   Culture   Final    NO GROWTH 5 DAYS Performed at [facility], [address] [place], [place] [address]    Report Status 06/27/2019 FINAL  Final  Blood culture (routine x 2)     Status: None   Collection Time: 06/22/19  6:55 AM  Specimen: BLOOD  Result Value Ref Range Status   Specimen Description  BLOOD LEFT ARM  Final   Special Requests   Final    BOTTLES DRAWN AEROBIC AND ANAEROBIC Blood Culture adequate volume   Culture   Final    NO GROWTH 5 DAYS Performed at [facility], [address], [place], [place] [address]    Report Status 06/27/2019 FINAL  Final         Radiology Studies: No results found.      Scheduled Meds: . vitamin C  500 mg Oral Daily  . aspirin EC  81 mg Oral Daily  . atorvastatin  40 mg Oral Daily  . enoxaparin (LOVENOX) injection  40 mg Subcutaneous Q24H  . insulin aspart  0-5 Units Subcutaneous QHS  . insulin aspart  0-9 Units Subcutaneous TID WC  . insulin detemir  8 Units Subcutaneous QHS  . ipratropium  2 puff Inhalation Q4H  . losartan  50 mg Oral Daily  . methylPREDNISolone (SOLU-MEDROL) injection  40 mg Intravenous Q12H  . oxymetazoline  1 spray Each Nare BID  . zinc sulfate  220 mg Oral Daily   Continuous Infusions:    LOS: 6 days    Time spent: 35 min    Sidney Ace, MD [facility] Pager [phone]  If 7PM-7AM, please contact night-coverage 06/28/2019, 1:56 PM

## 2019-06-29 DIAGNOSIS — J9601 Acute respiratory failure with hypoxia: Secondary | ICD-10-CM

## 2019-06-29 LAB — GLUCOSE, CAPILLARY
Glucose-Capillary: 144 mg/dL — ABNORMAL HIGH (ref 70–99)
Glucose-Capillary: 179 mg/dL — ABNORMAL HIGH (ref 70–99)
Glucose-Capillary: 136 mg/dL — ABNORMAL HIGH (ref 70–99)
Glucose-Capillary: 221 mg/dL — ABNORMAL HIGH (ref 70–99)

## 2019-06-29 MED ORDER — POLYETHYLENE GLYCOL 3350 17 G PO PACK
17.0000 g | PACK | Freq: Two times a day (BID) | ORAL | Status: DC
  Administered 2019-06-29 – 2019-07-05 (×6): 17 g via ORAL
  Filled 2019-06-29 (×8): qty 1

## 2019-06-29 MED ORDER — BISACODYL 10 MG RE SUPP: 10.0000 mg | Freq: Once | RECTAL | Status: DC

## 2019-06-29 NOTE — Progress Notes (Signed)
Physical Therapy Treatment Patient Details Name: Kent [name] MRN: [id number] DOB: 1955-12-30 Today's Date: 06/29/2019    History of Present Illness Kent [name] is an 64 y.o. 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  Patient left the ED, but later returned via [facility] with left extremity weakness. MRI of the brain showed acute/subacute infarcts in the right occipital, right temporal and right thalamic regions.  Some associated petechial hemorrhage noted.  Follow up CTA shows multiple areas of stenosis/occlusion including left vertebral origin moderate to severe, right PCA occlusion, left PCA severe, proximal right M2 moderate to severe.  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    PT Comments    PT/OT co-treat for safety and complexity of pt's case. He requires +2 assist for safety with basic transfers and mobility. Pt is very motivated and pleasant throughout. Rest BP 147/112 that elevated to 167/115 upon sitting up EOB on L side of bed. Pt is asymptomatic. He required min assist to roll to L to short sit. Vcs for technique and sequencing. Sat EOB x ~ 5 minutes. After 2 minutes seated EOB; BP 147/107, after 3 minutes BP 120/88. He requested to get OOB to recliner and reports no symptoms of dizziness/head ache. Pt was able to stand 1 x EOB prior to stand pivot to recliner. +1 UE support (LUE) during transfers + gait belt + vcs for technique and safety. Pt stood ~ 1 minute prior to needing seated rest due to fatigue. He was able to stand pivot to recliner with LUE +1 HHA + mod assist. +2/2nd person for safety/equipment management. He was seated in recliner at conclusion of session with call bell  in reach. Therapist assisted pt with calling his son/then spouse. Pt more emotional and very appreciative throughout session. Acute PT will continue to follow per current POC and progress as able per pt tolerance. Pt would benefit from continued skilled PT to address deficits at DC.     Follow Up Recommendations  CIR;SNF;Supervision/Assistance - 24 hour;Supervision for mobility/OOB     Equipment Recommendations  Rolling walker with 5" wheels;3in1 (PT);Wheelchair (measurements PT);Wheelchair cushion (measurements PT)    Recommendations for Other Services       Precautions / Restrictions Precautions Precautions: Fall Precaution Comments: High fall Restrictions Weight Bearing Restrictions: No    Mobility  Bed Mobility Overal bed mobility: Needs Assistance Bed Mobility: Supine to Sit;Sidelying to Sit   Sidelying to sit: Min guard Supine to sit: Min assist Sit to supine: Min assist;+2 for safety/equipment   General bed mobility comments: Pt was able to roll L to short sit with increased time and vcs throughout for technique. CGA to roll L but min assist to achieve EOB sitting. 2nd person close for safety.  Transfers Overall transfer level: Needs assistance Equipment used: 1 person hand held assist Transfers: Sit to/from Stand Sit to Stand: +2 safety/equipment;Min assist;Mod assist Stand pivot transfers: Mod assist       General transfer comment: Min assist to stand with +1 LUE support only. 2nd trial required mod assist to safely stand from EOB and pivot to recliner towards R. Vcs for handplacement, technique, and sequencing  Ambulation/Gait  General Gait Details: unsafe to trial ambulation at this time. pt fatigues with transfers.   Stairs             Wheelchair Mobility    Modified Rankin (Stroke Patients Only)       Balance Overall balance assessment: Needs assistance Sitting-balance support: Feet supported;Bilateral upper extremity  supported Sitting balance-Leahy Scale: Fair Sitting balance - Comments: no LOB seated EOB however continues to have visual deificts and L neglect. close SBA throughout sitting balance performed   Standing balance support: During functional activity;Single extremity supported Standing balance-Leahy Scale: Poor Standing balance comment: Pt is unsteady on his feet with +1 (LUE) support. only stood ~ 1 minute each trial. Pt continues to be high fall risk.                            Cognition Arousal/Alertness: Awake/alert   Overall Cognitive Status: Within Functional Limits for tasks assessed                                 General Comments: Pt was A and oriented but continues to have slightly poor insight of deficits with slight impulsivity. Safety awareness is a concern.      Exercises      General Comments        Pertinent Vitals/Pain Pain Assessment: No/denies pain    Home Living                      Prior Function            PT Goals (current goals can now be found in the care plan section) Acute Rehab PT Goals Patient Stated Goal: I want to go home" Progress towards PT goals: Progressing toward goals    Frequency    7X/week      PT Plan Current plan remains appropriate    Co-evaluation PT/OT/SLP Co-Evaluation/Treatment: Yes Reason for Co-Treatment: Complexity of the patient's impairments (multi-system involvement);Necessary to address cognition/behavior during functional activity;To address functional/ADL transfers;For patient/therapist safety PT goals addressed during session: Mobility/safety with mobility;Balance;Strengthening/ROM;Proper use of DME        AM-PAC PT "6 Clicks" Mobility   Outcome Measure  Help needed turning from your back to your side while in a flat bed without using bedrails?: A Little Help needed moving from lying on your back to sitting on the side of a flat bed without using bedrails?: A  Little Help needed moving to and from a bed to a chair (including a wheelchair)?: A Lot Help needed standing up from a chair using your arms (e.g., wheelchair or bedside chair)?: A Lot Help needed to walk in hospital room?: A Lot Help needed climbing 3-5 steps with a railing? : Total 6 Click Score: 13    End of Session Equipment Utilized During Treatment: Gait belt Activity Tolerance: Patient limited by fatigue Patient left: in chair;with call bell/phone within reach;with chair alarm set Nurse Communication: Mobility status PT Visit Diagnosis: Unsteadiness on feet (R26.81);Difficulty in walking, not elsewhere classified (R26.2);Hemiplegia and hemiparesis Hemiplegia - Right/Left: Left Hemiplegia - dominant/non-dominant: Dominant Hemiplegia - caused by: Cerebral infarction     Time: 2542-7062 PT Time Calculation (min) (ACUTE ONLY): 32 min  Charges:  $Neuromuscular Re-education: 8-22 mins                     Julaine Fusi PTA  06/29/19, 12:55 PM

## 2019-06-29 NOTE — TOC Progression Note (Signed)
Transition of Care [facility]) - Progression Note    Patient Details  Name: Kent [name] MRN: [id number] Date of Birth: September 07, 1955  Transition of Care [facility]) CM/SW Contact  Fatisha Rabalais, Gardiner Rhyme, LCSW Phone Number: 06/29/2019, 1:39 PM  Clinical Narrative: Spoke with wife_[name] via telephone to discuss the plan for trying to get pt place with a LOG. He will receive Medicare in June when he turns 56. She will apply for Medicaid for him. She is quite ill with recovering form COVID with HHPT and HHRN along with being on 3 liters O2 and cardiac issues.  Pt is progressing in therapies. Spoke with [facility] regarding taking pt. She will need to check with Medical director and will need to be 10 out from [place]. Have faxed out FL2 and will work on obtaining a bed for pt to continue his rehab. Wife to apply for medicaid for him.    Expected Discharge Plan: [place] Barriers to Discharge: Continued Medical Work up  Expected Discharge Plan and Services Expected Discharge Plan: [place] In-house Referral: Clinical Social Work   Post Acute Care Choice: [place] arrangements for the past 2 months: Single Family Home                                       Social Determinants of Health (SDOH) Interventions    Readmission Risk Interventions No flowsheet data found.

## 2019-06-29 NOTE — Progress Notes (Signed)
Occupational Therapy Treatment Patient Details Name: Kent [name] MRN: [id number] DOB: 1955-11-25 Today's Date: 06/29/2019    History of present illness Kent [name] is an 64 y.o. 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  Patient left the ED, but later returned via [facility] with left extremity weakness. MRI of the brain showed acute/subacute infarcts in the right occipital, right temporal and right thalamic regions.  Some associated petechial hemorrhage noted.  Follow up CTA shows multiple areas of stenosis/occlusion including left vertebral origin moderate to severe, right PCA occlusion, left PCA severe, proximal right M2 moderate to severe.  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   OT comments  Pt seen for OT tx this date to f/u re: safety with self care ADLs/ADL mobility. Pt laying in bed with PTA/OT enter room for treatment. Pt requires MIN A to perform sup to sit to his L side. Demos F static sitting balance at EOB with some LOB posteriorly/L laterally, but demos increased awareness/self-correction efforts this date. OT engages pt in scanning while seated EOB to attempt to improve pt's awareness of L side and engage in ADLs. OT engages pt in UB dressing with MOD verbal/visual/tactile cues to try hemi dressing technique. Pt with difficulty sequencing this task. Pt does however, demo increased coordination for LB dressing this date with LLE. Pt reaches to adjust L sock with R hand and demos increased control/decreased dysmetria. Pt performs sit to stand with primarily MIN/MOD 1 person assist initially. Noted to neglect L side throughout and leaves L LE outside what would be an ideal standing  BOS. On second sit to stand trial, requires MIN/MOD A +2 (PTA primarily supporting weaker L side, OT engages to assist on R when pt noted to exhibit more effort on this trial) for safety and physical assistance, pt demos increased difficulty with push to stand. Pt requires verbal/visual cues to bring L foot closer to BOS. Pt indicates that his wife has back problems and is struggling to recover from [place] as well. Overall, home d/c at this time is not ideal for pt. Anticipate that rehab is still most prudent d/c recommendation for pt safety/fall prevention. Of note: BP does eventually demo orthostatic after 2-3 mins in EOB Sitting. See balance section for more detail.    Follow Up Recommendations  CIR;SNF;Supervision/Assistance - 24 hour    Equipment Recommendations  3 in 1 bedside commode    Recommendations for Other Services      Precautions / Restrictions Precautions Precautions: Fall Precaution Comments: High fall Restrictions Weight Bearing Restrictions: No       Mobility Bed Mobility Overal bed mobility: Needs Assistance Bed Mobility: Supine to Sit   Supine to sit: Min assist   General bed mobility comments: Pt requires increased time and cues for safety  Transfers Overall transfer level: Needs assistance Equipment used: 1 person hand held assist Transfers: Sit to/from Stand Sit to Stand: Min assist;Mod assist Stand pivot transfers: Min assist;Mod assist;+2 safety/equipment       General transfer comment: Pt stands with ~MIN A to MIN/MOD A of 1 person on initial trial from EOB, second person standing by for safety as pt still impulsive. On second trial Pt requires some increased assistance from second  person and MIN/MOD A +2 for SPS to recliner on his R side (moved for optimal safety positioning)    Balance Overall balance assessment: Needs assistance Sitting-balance support: Feet supported;Bilateral upper extremity supported Sitting balance-Leahy Scale: Fair Sitting  balance - Comments: intermittent periods of L or posterior lean Postural control: Posterior lean;Left lateral lean Standing balance support: During functional activity;Single extremity supported Standing balance-Leahy Scale: Poor Standing balance comment: Pt continues to require UE support (L > R) with slightly decreased L lateral lean this date.                           ADL either performed or assessed with clinical judgement   ADL Overall ADL's : Needs assistance/impaired                                       General ADL Comments: Pt requires MOD A and MOD verbal/visual cues to scan to see L side and thread L sleeve while in EOB sitting. Pt demos slightly decreased dysmetria in L LE while using R hand to pull up L sock, demos some improved coordination.     Vision Patient Visual Report: Other (comment)(L neglect)     Perception     Praxis      Cognition Arousal/Alertness: Awake/alert Behavior During Therapy: WFL for tasks assessed/performed;Impulsive Overall Cognitive Status: Impaired/Different from baseline Area of Impairment: Problem solving;Following commands;Safety/judgement;Awareness                       Following Commands: Follows one step commands with increased time Safety/Judgement: Decreased awareness of safety;Decreased awareness of deficits Awareness: Intellectual Problem Solving: Slow processing;Requires verbal cues;Requires tactile cues General Comments: Pt still with some impulsivity/decreased safety awareness. Demos some improved problem solving this date with hand placement for arm in arm SPS to chair.        Exercises Other Exercises: OT facilitates pt participation in hemi dressing of L UE and provides education re: scanning head by turning to visualize L side of body for ADLs. Anticipate benefit from f/u with use of mirror. Other Exercises: Laying BP:  147/112, Sitting BP: 167/115, Sitting after 2 mins: 147/107,  Sitting after 3 mins: 120/88, Standing BP: unable, Sitting in chair end of session: 169/98   Shoulder Instructions       General Comments Laying BP:  147/112, Sitting BP: 167/115, Sitting after 2 mins: 147/107, Sitting after 3 mins: 120/88, Standing BP: unable, Sitting in chair end of session: 169/98    Pertinent Vitals/ Pain       Pain Assessment: No/denies pain  Home Living                                          Prior Functioning/Environment              Frequency  Min 3X/week        Progress Toward Goals  OT Goals(current goals can now be found in the care plan section)  Progress towards OT goals: Progressing toward goals  Acute Rehab OT Goals Patient Stated Goal: I want to go home" OT Goal Formulation: With patient Time For Goal Achievement: 07/06/19 Potential to Achieve Goals: [place] Discharge plan needs to be updated;Frequency remains appropriate  Co-evaluation    PT/OT/SLP Co-Evaluation/Treatment: Yes Reason for Co-Treatment: Complexity of the patient's impairments (multi-system involvement);Necessary to address cognition/behavior during functional activity;For patient/therapist safety;To address functional/ADL transfers PT goals addressed during session: Mobility/safety with mobility;Balance;Proper use of DME;Strengthening/ROM OT goals addressed during session: ADL's and self-care;Proper use of Adaptive equipment and DME      AM-PAC OT "6 Clicks" Daily Activity     Outcome Measure   Help from another person eating meals?: A Little Help from another person taking care of personal grooming?: A Little Help from another person toileting, which includes using toliet, bedpan, or urinal?: A Lot Help from another person bathing (including washing, rinsing, drying)?: A Lot Help from another person to put on and taking off regular upper body clothing?: A Little Help from another person to put on and taking off regular lower body  clothing?: A Lot 6 Click Score: 15    End of Session Equipment Utilized During Treatment: Gait belt;Rolling walker  OT Visit Diagnosis: Other abnormalities of gait and mobility (R26.89);Hemiplegia and hemiparesis Hemiplegia - Right/Left: Left Hemiplegia - dominant/non-dominant: Dominant Hemiplegia - caused by: Cerebral infarction   Activity Tolerance Patient tolerated treatment well   Patient Left in bed;with call bell/phone within reach;with bed alarm set   Nurse Communication Mobility status;Other (comment)(coordination considerations for eating)        Time: 9758-8325 OT Time Calculation (min): 32 min  Charges: OT General Charges $OT Visit: 1 Visit OT Treatments $Self Care/Home Management : 8-22 mins  Gerrianne Scale, [place], OTR/L ascom [phone] 06/29/19, 2:12 PM

## 2019-06-29 NOTE — Progress Notes (Signed)
PROGRESS NOTE    Kent [name]  DXI:[id number] DOB: 07-Jul-1955 DOA: 06/22/2019 PCP: System, Pcp Not In    Brief Narrative: HPI: Kent [name] is a 64 y.o. 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  Pt had positive Covid19 test 4 days ago. He denies cough or shortness breath, but per RN in ED, pt has mild dry cough. No fever or chills. He states that he has been feeling weak and fatigue since then. He states tha0t he was asleep and when he woke up he feel weaker in left arm. He also states that he is having intermittent weakness in his legs bilaterally. He has dizziness when he stands up and walks. Per report, pt had possible slurry speech.  No vision loss or hearing loss. Pt reports incontinence of bladder and bowels.  No nausea, vomiting, or abdominal pain.  His son states that he had mild loose stool bowel movement at home.  Patient had oxygen desaturation to 86% on room air initially, currently 96% on room air.  Blood pressure 201/141, which improved to 123/93. Cardene gttt is ordered, but nopt started per RN in ED. HR 39 -108, RR21.  Chest x-ray showed right upper lobe patchy infiltration.  Patient is admitted to [place] bed as inpatient.  4/22: Patient seen and examined.  Still very sleepy and lethargic.  Does awaken easily and answers all my questions appropriately.  Neurosurgery consulted for aneurysms noted on CT angio.  Further initial review of imaging no indication for intervention.  Aneurysms appear small.  Recommend follow-up for neck mass noted on CT.  4/23: Patient seen and examined.  Sleeping on my arrival but easily awakened.  Answers questions appropriately.  Patient has been seen by a oncology, neurology, neurosurgery.  Running  hypertensive, permissive in setting of stroke.  4/24: Patient seen and examined.  Easily awakened.  Continues to endorse fatigue.  Answers all questions appropriately.  Blood pressure improved over interval.  4/25: Patient seen and examined.  Sleeping but easily awakened.  Reports some improvement in his fatigue.  [place] working with physical therapy.  Pending skilled nursing facility placement.    4/26: Patient seen and examined.  Sleeping but easily arousable.  Working actively with physical therapy.  Pending skilled nursing facility placement.  4/27: Patient seen and examined.  Still lethargic with significant unilateral weakness.   Lack of safe discharge plan at this time.   Assessment & Plan:   Principal Problem:   Stroke [facility]) Active Problems:   Hypertension   Hyperlipidemia   Hypokalemia   Hypertensive urgency   Acute respiratory failure with hypoxia (HCC)   Diabetes mellitus without complication (HCC)   Chronic diastolic CHF (congestive heart failure) (HCC)   Pneumonia due to COVID-19 virus  Stroke MRI of brain showed multifocal changes of acute/early subacute infarction within the right PCA territory involving the right occipital and temporal lobes.  Small amount of associated petechial hemorrhage within the right occipital lobe.  Acute/early subacute infarcts are also present within the right thalamus.   Neurology, Dr. Doy Mince is consulted. Plan: Echo with bubble, Ef 55%, no WMA, negative bubble Permissive hypertension for 48 hours. continue home ASA (pt was given 324 mg of ASA already) fasting lipid panel: no HLD noted  HbA1c 9.2, poor control of DM RN  swallow screen, passed, diet advanced UDS: negative PT and OT consults: rec SNF.  Unfortunately after discussion with case management appears patient is uninsured and thus SNF may not be a possibility.  We attempted to refer to CIR however due to CIR's policies patient need to be 20 days out from a positive Covid test  which would mean the earliest the patient can dispo to CIR would be Jul 19, 2019.  After reevaluation by physical therapy and Occupational Therapy they feel that discharge home is not a safe option.  I discussed this over the phone with the wife who stated that she was not informed of any of this.  I told her that I had been in close contact with the patient's son Kent [name] and the case management had also been talking to them.  Unclear to me why wife was not aware of the need this information.  I have asked case management to be in contact with the wife and explained that options are greatly limited in this case.  Case management will continue to work on a safe discharge plan for patient however we may be in a situation where we are forced to discharge this patient home with home health services.  Hypertension and Hypertensive urgency Hypertensive urgency resolved Permissive hypertension allowed for 48 hours Restarted home losartan Continue as needed IV hydralazine  Hyperlipidemia: - Lipitor 40 mg daily  Hypokalemia:  K= 3.2 on admission. - Repleted - Check Mg level: NL  Acute respiratory failure with hypoxia due to [place] due to COVID-19 virus  initially pt had oxygen desaturation to 86% on room air.   Chest x-ray showed right upper lobe patchy infiltration. Completed remdesivir, received Solumedrol 40 mg bid for many days --d/c solumedrol today - vitamin C, zinc.  - Bronchodilators - PRN Mucinex for cough  Diabetes mellitus without complication ([place])  Most recent A1c 9.2, poorly controled.  Patient is taking Metformin at home - Lantus 8U qHS -SSI - Hold home metformin  Chronic diastolic CHF (congestive heart failure) ([place]) 2D echo on 01/09/2016 showed EF of 45-50%.   No leg edema or JVD.   CHF seems to be compensated.  History of SCC head and neck History of GIST Per patients son he has been lost to onc followup since 2017 CT angio neck demonstrates soft tissue mass  concerning for tumor recurrence Patients last visit to oncology at [facility] 2017 Oncology reviewed CAT scan Concerning for recurrence Patient will need outpatient PET scan  DVT prophylaxis: lovenox Code Status: Partial Family Communication: Patient's son Archer Moist via phone [phone] on 06/27/2019.  Patient's wife [name] via phone [phone] on 4/27 Disposition Plan: Status is: Inpatient  Remains inpatient appropriate because:Unsafe d/c plan   Dispo: The patient is from: Home              Anticipated d/c is to: SNF              Anticipated d/c date is: uncertain              Patient currently is medically stable to d/c.   See above assessment and plan.  From medical standpoint patient can discharge however there is lack of a safe discharge plan here.  Unfortunately the patient is uninsured this will likely not be accepted to a skilled nursing facility bed.  CIR is unable to take him due to Covid positive status.  Physical therapy and Occupational Therapy are both stated that home health is likely not a safe  dispo plan.  We are limited in options.  Case management looking for safe discharge plan.   Consultants:   Neurology  NSG  Oncology   Procedures:   2D echo with bubble  Antimicrobials:   Remdesivir    Subjective: All pt kept saying was he wanted to take a nap, and asked for pillows.  No noted fever, N/V/D.     Objective: Vitals:   06/28/19 1713 06/29/19 0103 06/29/19 0751 06/29/19 1616  BP: (!) 147/90 (!) 136/98 (!) 150/111 91/76  Pulse: 92 80 76 75  Resp: 18 18    Temp: 98 F (36.7 C) (!) 97.4 F (36.3 C) 98.1 F (36.7 C)   TempSrc: Oral Oral Oral   SpO2: 96% 91% 100% 92%  Weight:      Height:        Intake/Output Summary (Last 24 hours) at 06/29/2019 1801 Last data filed at 06/29/2019 0949 Gross per 24 hour  Intake 240 ml  Output --  Net 240 ml   Filed Weights   06/22/19 0523  Weight: 81.6 kg    Examination:  Constitutional: NAD,  alert HEENT: conjunctivae and lids normal, EOMI CV: RRR no M,R,G. Distal pulses +2.  No cyanosis.   RESP: Reduced lung sounds, normal respiratory effort  GI: +BS, NTND Extremities: No effusions, edema, or tenderness in BLE SKIN: warm, dry and intact Neuro: II - XII grossly intact.     Data Reviewed: I have personally reviewed following labs and imaging studies  CBC: Recent Labs  Lab 06/23/19 0405 06/24/19 0513 06/25/19 0654 06/26/19 0507 06/27/19 0625  WBC 9.1 14.7* 15.1* 14.6* 15.8*  NEUTROABS 8.1* 13.1* 13.0* 12.8* 13.8*  HGB 14.9 14.7 14.6 14.4 15.5  HCT 42.3 40.4 41.3 39.6 43.7  MCV 83.1 81.1 82.8 80.7 81.8  PLT 347 408* 383 355 732   Basic Metabolic Panel: Recent Labs  Lab 06/23/19 0405 06/24/19 0513 06/25/19 0654 06/26/19 0507 06/27/19 0625  NA 135 135 133* 134* 135  K 3.6 3.5 3.9 3.5 3.5  CL 97* 96* 95* 98 96*  CO2 24 28 27 27 29   GLUCOSE 155* 185* 173* 189* 176*  BUN 10 18 23  27* 32*  CREATININE 0.99 1.03 1.13 1.01 1.03  CALCIUM 8.6* 8.8* 8.7* 8.4* 8.5*  MG 1.6*  --   --   --   --    GFR: Estimated Creatinine Clearance: 74.1 mL/min (by C-G formula based on SCr of 1.03 mg/dL). Liver Function Tests: Recent Labs  Lab 06/23/19 0405 06/24/19 0513 06/25/19 0654 06/26/19 0507 06/27/19 0625  AST 23 27 37 33 27  ALT 24 25 25 29  32  ALKPHOS 87 79 78 72 77  BILITOT 1.0 1.2 1.1 1.0 1.0  PROT 7.1 6.8 6.7 6.3* 7.1  ALBUMIN 3.8 3.9 3.8 3.6 4.0   No results for input(s): LIPASE, AMYLASE in the last 168 hours. No results for input(s): AMMONIA in the last 168 hours. Coagulation Profile: No results for input(s): INR, PROTIME in the last 168 hours. Cardiac Enzymes: No results for input(s): CKTOTAL, CKMB, CKMBINDEX, TROPONINI in the last 168 hours. BNP (last 3 results) No results for input(s): PROBNP in the last 8760 hours. HbA1C: No results for input(s): HGBA1C in the last 72 hours. CBG: Recent Labs  Lab 06/28/19 1711 06/28/19 2107 06/29/19 0752  06/29/19 1200 06/29/19 1615  GLUCAP 176* 115* 144* 179* 136*   Lipid Profile: No results for input(s): CHOL, HDL, LDLCALC, TRIG, CHOLHDL, LDLDIRECT in the last 72 hours. Thyroid  Function Tests: No results for input(s): TSH, T4TOTAL, FREET4, T3FREE, THYROIDAB in the last 72 hours. Anemia Panel: Recent Labs    06/27/19 0625  FERRITIN 372*   Sepsis Labs: No results for input(s): PROCALCITON, LATICACIDVEN in the last 168 hours.  Recent Results (from the past 240 hour(s))  Respiratory Panel by RT PCR (Flu A&B, Covid) - Urine, Clean Catch     Status: Abnormal   Collection Time: 06/22/19  5:10 AM   Specimen: Urine, Clean Catch  Result Value Ref Range Status   SARS Coronavirus 2 by RT PCR POSITIVE (A) NEGATIVE Final    Comment: RESULT CALLED TO, READ BACK BY AND VERIFIED WITH: GREG MOYER AT 7619 06/22/19 SDR (NOTE) SARS-CoV-2 target nucleic acids are DETECTED. SARS-CoV-2 RNA is generally detectable in upper respiratory specimens  during the acute phase of infection. Positive results are indicative of the presence of the identified virus, but do not rule out bacterial infection or co-infection with other pathogens not detected by the test. Clinical correlation with patient history and other diagnostic information is necessary to determine patient infection status. The expected result is Negative. Fact Sheet for Patients:  PinkCheek.be Fact Sheet for Healthcare Providers: GravelBags.it This test is not yet approved or cleared by the [place] FDA and  has been authorized for detection and/or diagnosis of SARS-CoV-2 by FDA under an Emergency Use Authorization (EUA).  This EUA will remain in effect (meaning this test can be used) for th e duration of  the COVID-19 declaration under Section 564(b)(1) of the Act, 21 U.S.C. section 360bbb-3(b)(1), unless the authorization is terminated or revoked sooner.    Influenza A by PCR  NEGATIVE NEGATIVE Final   Influenza B by PCR NEGATIVE NEGATIVE Final    Comment: (NOTE) The Xpert Xpress SARS-CoV-2/FLU/RSV assay is intended as an aid in  the diagnosis of influenza from Nasopharyngeal swab specimens and  should not be used as a sole basis for treatment. Nasal washings and  aspirates are unacceptable for Xpert Xpress SARS-CoV-2/FLU/RSV  testing. Fact Sheet for Patients: PinkCheek.be Fact Sheet for Healthcare Providers: GravelBags.it This test is not yet approved or cleared by the [place] FDA and  has been authorized for detection and/or diagnosis of SARS-CoV-2 by  FDA under an Emergency Use Authorization (EUA). This EUA will remain  in effect (meaning this test can be used) for the duration of the  Covid-19 declaration under Section 564(b)(1) of the Act, 21  U.S.C. section 360bbb-3(b)(1), unless the authorization is  terminated or revoked. Performed at [facility], [address] [place], [place] [address]   Blood culture (routine x 2)     Status: None   Collection Time: 06/22/19  6:55 AM   Specimen: BLOOD  Result Value Ref Range Status   Specimen Description BLOOD LEFT [facility]  Final   Special Requests   Final    BOTTLES DRAWN AEROBIC AND ANAEROBIC Blood Culture adequate volume   Culture   Final    NO GROWTH 5 DAYS Performed at [facility], [address], [place], [place] [address]    Report Status 06/27/2019 FINAL  Final  Blood culture (routine x 2)     Status: None   Collection Time: 06/22/19  6:55 AM   Specimen: BLOOD  Result Value Ref Range Status   Specimen Description BLOOD LEFT ARM  Final   Special Requests   Final    BOTTLES DRAWN AEROBIC AND ANAEROBIC Blood Culture adequate volume   Culture   Final  NO GROWTH 5 DAYS Performed at [facility], [address], [place], [place] [address]    Report Status 06/27/2019 FINAL  Final         Radiology  Studies: No results found.      Scheduled Meds: . vitamin C  500 mg Oral Daily  . aspirin EC  81 mg Oral Daily  . atorvastatin  40 mg Oral Daily  . bisacodyl  10 mg Rectal Once  . enoxaparin (LOVENOX) injection  40 mg Subcutaneous Q24H  . insulin aspart  0-9 Units Subcutaneous TID WC  . insulin detemir  8 Units Subcutaneous QHS  . ipratropium  2 puff Inhalation Q4H  . losartan  50 mg Oral Daily  . oxymetazoline  1 spray Each Nare BID  . polyethylene glycol  17 g Oral BID  . zinc sulfate  220 mg Oral Daily   Continuous Infusions:    LOS: 7 days     Enzo Bi, MD [facility] Pager [phone]  If 7PM-7AM, please contact night-coverage 06/29/2019, 6:01 PM

## 2019-06-30 LAB — GLUCOSE, CAPILLARY
Glucose-Capillary: 159 mg/dL — ABNORMAL HIGH (ref 70–99)
Glucose-Capillary: 119 mg/dL — ABNORMAL HIGH (ref 70–99)
Glucose-Capillary: 150 mg/dL — ABNORMAL HIGH (ref 70–99)
Glucose-Capillary: 125 mg/dL — ABNORMAL HIGH (ref 70–99)

## 2019-06-30 MED ORDER — MORPHINE SULFATE (PF) 2 MG/ML IV SOLN
2.0000 mg | Freq: Once | INTRAVENOUS | Status: AC
  Administered 2019-06-30: 2 mg via INTRAVENOUS
  Filled 2019-06-30: qty 1

## 2019-06-30 NOTE — Progress Notes (Signed)
Patient was assisted to chair with the help of Physical therapy. Chair alarm was on and in place. Chair alarm went off and [place], NT responded. Notified me that the pt. Was in the floor. Called for assistance and pt. Was assessed- A+O times 4. Denies hitting his head, states that the bottom portion of the recliner collapsed when he was trying to get comfortable.  All skin is intact, no bumps or bruises. Patient was assisted back to bed with not additional needs noted. MD made aware. Bed alarm set at this time.

## 2019-06-30 NOTE — Progress Notes (Signed)
PROGRESS NOTE    Kent [name]  VPX:[id number] DOB: [birth year] DOA: 06/22/2019 PCP: System, Pcp Not In    Brief Narrative: HPI: Kent [name] is a 64 y.o. 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  Pt had positive Covid19 test 4 days ago. He denies cough or shortness breath, but per RN in ED, pt has mild dry cough. No fever or chills. He states that he has been feeling weak and fatigue since then. He states tha0t he was asleep and when he woke up he feel weaker in left arm. He also states that he is having intermittent weakness in his legs bilaterally. He has dizziness when he stands up and walks. Per report, pt had possible slurry speech.  No vision loss or hearing loss. Pt reports incontinence of bladder and bowels.  No nausea, vomiting, or abdominal pain.  His son states that he had mild loose stool bowel movement at home.  Patient had oxygen desaturation to 86% on room air initially, currently 96% on room air.  Blood pressure 201/141, which improved to 123/93. Cardene gttt is ordered, but nopt started per RN in ED. HR 39 -108, RR21.  Chest x-ray showed right upper lobe patchy infiltration.  Patient is admitted to [facility] bed as inpatient.  4/22: Patient seen and examined.  Still very sleepy and lethargic.  Does awaken easily and answers all my questions appropriately.  Neurosurgery consulted for aneurysms noted on CT angio.  Further initial review of imaging no indication for intervention.  Aneurysms appear small.  Recommend follow-up for neck mass noted on CT.  4/23: Patient seen and examined.  Sleeping on my arrival but easily awakened.  Answers questions appropriately.  Patient has been seen by a oncology, neurology, neurosurgery.  Running  hypertensive, permissive in setting of stroke.  4/24: Patient seen and examined.  Easily awakened.  Continues to endorse fatigue.  Answers all questions appropriately.  Blood pressure improved over interval.  4/25: Patient seen and examined.  Sleeping but easily awakened.  Reports some improvement in his fatigue.  [place] working with physical therapy.  Pending skilled nursing facility placement.    4/26: Patient seen and examined.  Sleeping but easily arousable.  Working actively with physical therapy.  Pending skilled nursing facility placement.  4/27: Patient seen and examined.  Still lethargic with significant unilateral weakness.   Lack of safe discharge plan at this time.   Assessment & Plan:   Principal Problem:   Stroke [facility]) Active Problems:   Hypertension   Hyperlipidemia   Hypokalemia   Hypertensive urgency   Acute respiratory failure with hypoxia (HCC)   Diabetes mellitus without complication (HCC)   Chronic diastolic CHF (congestive heart failure) (HCC)   Pneumonia due to COVID-19 virus  Stroke MRI of brain showed multifocal changes of acute/early subacute infarction within the right PCA territory involving the right occipital and temporal lobes.  Small amount of associated petechial hemorrhage within the right occipital lobe.  Acute/early subacute infarcts are also present within the right thalamus.   Neurology, Dr. Doy Mince is consulted. Plan: Echo with bubble, Ef 55%, no WMA, negative bubble Permissive hypertension for 48 hours. continue home ASA (pt was given 324 mg of ASA already) fasting lipid panel: no HLD noted  HbA1c 9.2, poor control of DM RN  swallow screen, passed, diet advanced UDS: negative PT and OT consults: rec SNF.  Unfortunately after discussion with case management appears patient is uninsured and thus SNF may not be a possibility.  We attempted to refer to CIR however due to CIR's policies patient need to be 20 days out from a positive Covid test  which would mean the earliest the patient can dispo to CIR would be Jul 19, 2019.  After reevaluation by physical therapy and Occupational Therapy they feel that discharge home is not a safe option.  I discussed this over the phone with the wife who stated that she was not informed of any of this.  I told her that I had been in close contact with the patient's son [name] and the case management had also been talking to them.  Unclear to me why wife was not aware of the need this information.  I have asked case management to be in contact with the wife and explained that options are greatly limited in this case.  Case management will continue to work on a safe discharge plan for patient however we may be in a situation where we are forced to discharge this patient home with home health services.  Hypertension and Hypertensive urgency Hypertensive urgency resolved Permissive hypertension allowed for 48 hours Restarted home losartan Continue as needed IV hydralazine  Hyperlipidemia: - Lipitor 40 mg daily  Hypokalemia:  K= 3.2 on admission. - Repleted - Check Mg level: NL  Acute respiratory failure with hypoxia due to [place] due to COVID-19 virus  initially pt had oxygen desaturation to 86% on room air.   Chest x-ray showed right upper lobe patchy infiltration. Completed remdesivir, received Solumedrol 40 mg bid for many days. - vitamin C, zinc.  - Bronchodilators - PRN Mucinex for cough  Diabetes mellitus without complication (HCC)  Most recent A1c 9.2, poorly controled.  Patient is taking Metformin at home - Lantus 8U qHS -SSI - Hold home metformin  Chronic diastolic CHF (congestive heart failure) ([place]) 2D echo on 01/09/2016 showed EF of 45-50%.   No leg edema or JVD.   CHF seems to be compensated.  History of SCC head and neck History of GIST Per patients son he has been lost to onc followup since 2017 CT angio neck demonstrates soft tissue mass concerning for tumor  recurrence Patients last visit to oncology at [facility] 2017 Oncology reviewed CAT scan Concerning for recurrence Patient will need outpatient PET scan  DVT prophylaxis: lovenox Code Status: Partial Family Communication: Patient's son Mahamed Zalewski via phone [phone] on 06/27/2019.  Patient's wife [name] via phone [phone] on 4/27 Disposition Plan: Status is: Inpatient  Remains inpatient appropriate because:Unsafe d/c plan   Dispo: The patient is from: Home              Anticipated d/c is to: SNF              Anticipated d/c date is: uncertain              Patient currently is medically stable to d/c.   See above assessment and plan.  From medical standpoint patient can discharge however there is lack of a safe discharge plan here.  Unfortunately the patient is uninsured this will likely not be accepted to a skilled nursing facility bed.  CIR is unable to take him due to Covid positive status.  Physical therapy and Occupational Therapy are both stated that home health is likely not a safe dispo plan.  We are limited in options.  Case management looking for safe discharge plan.   Consultants:   Neurology  NSG  Oncology   Procedures:   2D echo with bubble  Antimicrobials:   Remdesivir    Subjective: Pt complained of not getting air when he falls asleep.  Denied pain.  No fever, N/V/D.   Objective: Vitals:   06/29/19 1616 06/29/19 2250 06/30/19 0822 06/30/19 1657  BP: 91/76 118/82 104/76 101/72  Pulse: 75 72 75 67  Resp:  20 18 20   Temp:  98 F (36.7 C) 98.6 F (37 C) 98.1 F (36.7 C)  TempSrc:  Oral  Oral  SpO2: 92% 94% 99% 97%  Weight:      Height:        Intake/Output Summary (Last 24 hours) at 06/30/2019 1814 Last data filed at 06/30/2019 0451 Gross per 24 hour  Intake -  Output 225 ml  Net -225 ml   Filed Weights   06/22/19 0523  Weight: 81.6 kg    Examination:  Constitutional: NAD, sleepy but arousable HEENT: conjunctivae and lids normal, EOMI  CV: RRR no M,R,G. Distal pulses +2.  No cyanosis.   RESP: clear, normal respiratory effort  GI: +BS, NTND Extremities: No effusions, edema, or tenderness in BLE SKIN: warm, dry and intact Neuro: II - XII grossly intact.     Data Reviewed: I have personally reviewed following labs and imaging studies  CBC: Recent Labs  Lab 06/24/19 0513 06/25/19 0654 06/26/19 0507 06/27/19 0625  WBC 14.7* 15.1* 14.6* 15.8*  NEUTROABS 13.1* 13.0* 12.8* 13.8*  HGB 14.7 14.6 14.4 15.5  HCT 40.4 41.3 39.6 43.7  MCV 81.1 82.8 80.7 81.8  PLT 408* 383 355 546   Basic Metabolic Panel: Recent Labs  Lab 06/24/19 0513 06/25/19 0654 06/26/19 0507 06/27/19 0625  NA 135 133* 134* 135  K 3.5 3.9 3.5 3.5  CL 96* 95* 98 96*  CO2 28 27 27 29   GLUCOSE 185* 173* 189* 176*  BUN 18 23 27* 32*  CREATININE 1.03 1.13 1.01 1.03  CALCIUM 8.8* 8.7* 8.4* 8.5*   GFR: Estimated Creatinine Clearance: 74.1 mL/min (by C-G formula based on SCr of 1.03 mg/dL). Liver Function Tests: Recent Labs  Lab 06/24/19 0513 06/25/19 0654 06/26/19 0507 06/27/19 0625  AST 27 37 33 27  ALT 25 25 29  32  ALKPHOS 79 78 72 77  BILITOT 1.2 1.1 1.0 1.0  PROT 6.8 6.7 6.3* 7.1  ALBUMIN 3.9 3.8 3.6 4.0   No results for input(s): LIPASE, AMYLASE in the last 168 hours. No results for input(s): AMMONIA in the last 168 hours. Coagulation Profile: No results for input(s): INR, PROTIME in the last 168 hours. Cardiac Enzymes: No results for input(s): CKTOTAL, CKMB, CKMBINDEX, TROPONINI in the last 168 hours. BNP (last 3 results) No results for input(s): PROBNP in the last 8760 hours. HbA1C: No results for input(s): HGBA1C in the last 72 hours. CBG: Recent Labs  Lab 06/29/19 1615 06/29/19 2136 06/30/19 0819 06/30/19 1215 06/30/19 1653  GLUCAP 136* 221* 159* 119* 150*   Lipid Profile: No results for input(s): CHOL, HDL, LDLCALC, TRIG, CHOLHDL, LDLDIRECT in the last 72 hours. Thyroid Function Tests: No results for input(s):  TSH, T4TOTAL, FREET4, T3FREE, THYROIDAB in the last 72 hours. Anemia Panel: No results for input(s): VITAMINB12, FOLATE, FERRITIN, TIBC, IRON, RETICCTPCT in the last 72 hours. Sepsis Labs: No results for input(s): PROCALCITON, LATICACIDVEN in the last 168 hours.  Recent Results (from the past 240  hour(s))  Respiratory Panel by RT PCR (Flu A&B, Covid) - Urine, Clean Catch     Status: Abnormal   Collection Time: 06/22/19  5:10 AM   Specimen: Urine, Clean Catch  Result Value Ref Range Status   SARS Coronavirus 2 by RT PCR POSITIVE (A) NEGATIVE Final    Comment: RESULT CALLED TO, READ BACK BY AND VERIFIED WITH: GREG MOYER AT 2542 06/22/19 SDR (NOTE) SARS-CoV-2 target nucleic acids are DETECTED. SARS-CoV-2 RNA is generally detectable in upper respiratory specimens  during the acute phase of infection. Positive results are indicative of the presence of the identified virus, but do not rule out bacterial infection or co-infection with other pathogens not detected by the test. Clinical correlation with patient history and other diagnostic information is necessary to determine patient infection status. The expected result is Negative. Fact Sheet for Patients:  PinkCheek.be Fact Sheet for Healthcare Providers: GravelBags.it This test is not yet approved or cleared by the [place] FDA and  has been authorized for detection and/or diagnosis of SARS-CoV-2 by FDA under an Emergency Use Authorization (EUA).  This EUA will remain in effect (meaning this test can be used) for th e duration of  the COVID-19 declaration under Section 564(b)(1) of the Act, 21 U.S.C. section 360bbb-3(b)(1), unless the authorization is terminated or revoked sooner.    Influenza A by PCR NEGATIVE NEGATIVE Final   Influenza B by PCR NEGATIVE NEGATIVE Final    Comment: (NOTE) The Xpert Xpress SARS-CoV-2/FLU/RSV assay is intended as an aid in  the diagnosis of  influenza from Nasopharyngeal swab specimens and  should not be used as a sole basis for treatment. Nasal washings and  aspirates are unacceptable for Xpert Xpress SARS-CoV-2/FLU/RSV  testing. Fact Sheet for Patients: PinkCheek.be Fact Sheet for Healthcare Providers: GravelBags.it This test is not yet approved or cleared by the [place] FDA and  has been authorized for detection and/or diagnosis of SARS-CoV-2 by  FDA under an Emergency Use Authorization (EUA). This EUA will remain  in effect (meaning this test can be used) for the duration of the  Covid-19 declaration under Section 564(b)(1) of the Act, 21  U.S.C. section 360bbb-3(b)(1), unless the authorization is  terminated or revoked. Performed at [facility], [address] [place], [place] [address]   Blood culture (routine x 2)     Status: None   Collection Time: 06/22/19  6:55 AM   Specimen: BLOOD  Result Value Ref Range Status   Specimen Description BLOOD LEFT [facility]  Final   Special Requests   Final    BOTTLES DRAWN AEROBIC AND ANAEROBIC Blood Culture adequate volume   Culture   Final    NO GROWTH 5 DAYS Performed at [facility], [address], [place], [place] [address]    Report Status 06/27/2019 FINAL  Final  Blood culture (routine x 2)     Status: None   Collection Time: 06/22/19  6:55 AM   Specimen: BLOOD  Result Value Ref Range Status   Specimen Description BLOOD LEFT ARM  Final   Special Requests   Final    BOTTLES DRAWN AEROBIC AND ANAEROBIC Blood Culture adequate volume   Culture   Final    NO GROWTH 5 DAYS Performed at [facility], [address], [place], [place] [address]    Report Status 06/27/2019 FINAL  Final         Radiology Studies: No results found.      Scheduled Meds: . vitamin  C  500 mg Oral Daily  . aspirin EC  81 mg Oral Daily  . atorvastatin  40 mg Oral Daily  . bisacodyl  10 mg  Rectal Once  . enoxaparin (LOVENOX) injection  40 mg Subcutaneous Q24H  . insulin aspart  0-9 Units Subcutaneous TID WC  . insulin detemir  8 Units Subcutaneous QHS  . ipratropium  2 puff Inhalation Q4H  . losartan  50 mg Oral Daily  . oxymetazoline  1 spray Each Nare BID  . polyethylene glycol  17 g Oral BID  . zinc sulfate  220 mg Oral Daily   Continuous Infusions:    LOS: 8 days     Enzo Bi, MD [facility] Pager [phone]  If 7PM-7AM, please contact night-coverage 06/30/2019, 6:14 PM

## 2019-06-30 NOTE — Progress Notes (Signed)
Shift report received from [facility], [place]. Morning assessment completed. Pt. Resting in bed, arouses easily to name calling. Denies any current needs. Call light is within reach, will continue to monitor.

## 2019-06-30 NOTE — TOC Progression Note (Signed)
Transition of Care [facility]) - Progression Note    Patient Details  Name: Kent [name] MRN: [id number] Date of Birth: 1955-10-23  Transition of Care [facility]) CM/SW Contact  Merrill Villarruel, Gardiner Rhyme, LCSW Phone Number: 06/30/2019, 2:20 PM  Clinical Narrative:   Have spoken with [facility] regarding [facility] and if could take pt. She is Optician, dispensing but pt will need to be 10 days out from COVID test which will be Sat 5/1. Have set FL2 and therapy notes. Pt and wife aware of plan to go to SNF for a short time before going back home with wife.    Expected Discharge Plan: [place] Barriers to Discharge: Inadequate or no insurance, SNF Pending payor source - LOG  Expected Discharge Plan and Services Expected Discharge Plan: [place] In-house Referral: Clinical Social Work   Post Acute Care Choice: [place] arrangements for the past 2 months: Single Family Home                                       Social Determinants of Health (SDOH) Interventions    Readmission Risk Interventions No flowsheet data found.

## 2019-06-30 NOTE — Progress Notes (Signed)
Medications administered per orders. Pt. Has completed breakfast - ate about 50% of meal. Denies any pain or needs at this time. Call light is within reach, will continue to monitor.

## 2019-06-30 NOTE — Progress Notes (Signed)
Physical Therapy Treatment Patient Details Name: Kent [name] MRN: [id number] DOB: 06-15-1955 Today's Date: 06/30/2019    History of Present Illness Kent [name] is an 64 y.o. 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  Patient left the ED, but later returned via [facility] with left extremity weakness. MRI of the brain showed acute/subacute infarcts in the right occipital, right temporal and right thalamic regions.  Some associated petechial hemorrhage noted.  Follow up CTA shows multiple areas of stenosis/occlusion including left vertebral origin moderate to severe, right PCA occlusion, left PCA severe, proximal right M2 moderate to severe.  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    PT Comments    Pt was long sitting in bed upon arriving. He agrees to PT session and is highly motivated to participate. Pt continues to have L neglect and cognition deficits. Poor insight of deficits and poor safety awareness however cooperative and able to follow commands consistently. He performed roll to L to short sit with min A + increased time and vcs for safety/sequencing. Pt is impulsive and requires vcs to slow down throughout. He is able to stand at EOB with LUE HHA + min assist. Worked on standing balance. He was able to clear L then R LE off floor however have severe L lateral lean and very ataxtic movements of LLE. Unsafe to progress to ambulation at this time. Several occasions of max assist to prevent fall with single leg support activity. He was able to stand pivot to recliner with only mod assist + 1 UE HHA. Increased time to perform and vcs for technique. Pt demonstrated improved ability to pivot to  R versus L. He was seated in recliner at conclusion of session with call bell in reach and chair alarm in place. Acute PT will continue to follow per POC and progress as able. Recommend CIR/SNF at DC to address safety, balance, strength, coordination, and overall safe functional mobility deficits.      Follow Up Recommendations  CIR;SNF;Supervision/Assistance - 24 hour;Supervision for mobility/OOB     Equipment Recommendations  Rolling walker with 5" wheels;3in1 (PT);Wheelchair (measurements PT);Wheelchair cushion (measurements PT)    Recommendations for Other Services       Precautions / Restrictions Precautions Precautions: Fall Precaution Comments: High fall Restrictions Weight Bearing Restrictions: No    Mobility  Bed Mobility Overal bed mobility: Needs Assistance Bed Mobility: Supine to Sit   Sidelying to sit: Min guard Supine to sit: Min assist Sit to supine: Min assist   General bed mobility comments: Pt was able to exit L side of bed via log roll. MIn assist form sidelying to short sit with vcs throughout for technique and safe sequencing.  Transfers Overall transfer level: Needs assistance Equipment used: 1 person hand held assist Transfers: Sit to/from [facility] Sit to Stand: Min assist Stand pivot transfers: Mod assist       General transfer comment: Min assist to stand 3 x EOB with LUE HHA. mod assist to pivot to recliner.  Ambulation/Gait             General Gait Details: attempted to take steps however ataxia and impulsivity made it unsafe    Stairs  Wheelchair Mobility    Modified Rankin (Stroke Patients Only)       Balance                                            Cognition Arousal/Alertness: Awake/alert Behavior During Therapy: WFL for tasks assessed/performed;Impulsive Overall Cognitive Status: Impaired/Different from baseline Area of Impairment: Problem solving;Following  commands;Safety/judgement;Awareness                       Following Commands: Follows one step commands with increased time Safety/Judgement: Decreased awareness of safety;Decreased awareness of deficits Awareness: Intellectual Problem Solving: Slow processing;Requires verbal cues;Requires tactile cues General Comments: pt continues to have impulsivity and poor safety awareness.      Exercises      General Comments        Pertinent Vitals/Pain Pain Assessment: No/denies pain    Home Living                      Prior Function            PT Goals (current goals can now be found in the care plan section) Acute Rehab PT Goals Patient Stated Goal: I want to go home" Progress towards PT goals: Progressing toward goals    Frequency    7X/week      PT Plan Current plan remains appropriate    Co-evaluation     PT goals addressed during session: Mobility/safety with mobility;Balance;Proper use of DME;Strengthening/ROM        AM-PAC PT "6 Clicks" Mobility   Outcome Measure  Help needed turning from your back to your side while in a flat bed without using bedrails?: A Little Help needed moving from lying on your back to sitting on the side of a flat bed without using bedrails?: A Little Help needed moving to and from a bed to a chair (including a wheelchair)?: A Lot Help needed standing up from a chair using your arms (e.g., wheelchair or bedside chair)?: A Lot Help needed to walk in hospital room?: A Lot Help needed climbing 3-5 steps with a railing? : Total 6 Click Score: 13    End of Session Equipment Utilized During Treatment: Gait belt Activity Tolerance: Patient limited by fatigue Patient left: in chair;with call bell/phone within reach;with chair alarm set Nurse Communication: Mobility status PT Visit Diagnosis: Unsteadiness on feet (R26.81);Difficulty in walking, not elsewhere classified (R26.2);Hemiplegia and hemiparesis Hemiplegia -  Right/Left: Left Hemiplegia - dominant/non-dominant: Dominant Hemiplegia - caused by: Cerebral infarction     Time: 1020-1046 PT Time Calculation (min) (ACUTE ONLY): 26 min  Charges:  $Therapeutic Activity: 8-22 mins $Neuromuscular Re-education: 8-22 mins                     Julaine Fusi PTA 06/30/19, 1:12 PM

## 2019-07-01 LAB — GLUCOSE, CAPILLARY
Glucose-Capillary: 101 mg/dL — ABNORMAL HIGH (ref 70–99)
Glucose-Capillary: 119 mg/dL — ABNORMAL HIGH (ref 70–99)
Glucose-Capillary: 182 mg/dL — ABNORMAL HIGH (ref 70–99)

## 2019-07-01 MED ORDER — DOXYLAMINE SUCCINATE (SLEEP) 25 MG PO TABS
25.0000 mg | ORAL_TABLET | Freq: Every evening | ORAL | Status: DC | PRN
  Administered 2019-07-01 – 2019-07-03 (×4): 25 mg via ORAL
  Filled 2019-07-01 (×5): qty 1

## 2019-07-01 NOTE — Progress Notes (Signed)
Occupational Therapy Treatment Patient Details Name: Kent [name] MRN: [id number] DOB: August 27, 1955 Today's Date: 07/01/2019    History of present illness Kent [name] is an 64 y.o. 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  Patient left the ED, but later returned via [facility] with left extremity weakness. MRI of the brain showed acute/subacute infarcts in the right occipital, right temporal and right thalamic regions.  Some associated petechial hemorrhage noted.  Follow up CTA shows multiple areas of stenosis/occlusion including left vertebral origin moderate to severe, right PCA occlusion, left PCA severe, proximal right M2 moderate to severe.  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   OT comments  Kent [name] was seen for OT treatment on this date. Upon arrival to room pt awake in bed, meal tray and bed sheets on floor (RN reports pt accidentally kicked off, RN in room during session/after end of sessio nto assist with changing sheets and bathing). Pt enthusiastic to participate in therapy this date. Pt required MIN A + R rail + L knee block sit<>stand x4 at EOB. Pt maintained standing while RN changed sheets but attempted to sit early - impulsive movements noted t/o standing trials. Pt requires MAX A don/doff gown at bed level + VCs for attention to L side. Pt continues to benefit from skilled OT services to maximize return to PLOF and minimize risk of future falls, injury, caregiver burden, and readmission. Will continue to follow POC. Discharge recommendation remains appropriate.    Follow Up Recommendations  CIR;SNF;Supervision/Assistance - 24 hour    Equipment Recommendations  3 in 1  bedside commode    Recommendations for Other Services      Precautions / Restrictions Precautions Precautions: Fall Precaution Comments: High fall Restrictions Weight Bearing Restrictions: No       Mobility Bed Mobility Overal bed mobility: Needs Assistance Bed Mobility: Supine to Sit     Supine to sit: Min assist(MIN A for LLE and squaring hips to EOB; R rail)        Transfers Overall transfer level: Needs assistance Equipment used: 1 person hand held assist Transfers: Sit to/from Stand Sit to Stand: Min assist         General transfer comment: MIN A + R rail sit<>stand x4 at EOB. Pt maintained standing while RN changed sheets. Pt attempted to sit early and impulsive movements noted t/o.     Balance Overall balance assessment: Needs assistance Sitting-balance support: Feet supported;Bilateral upper extremity supported Sitting balance-Leahy Scale: Fair Sitting balance - Comments: CGA + R rail intermittently improving to close SBA + R rail sitting EOB Postural control: Right lateral lean Standing balance support: Single extremity supported Standing balance-Leahy Scale: Poor Standing balance comment: MOD A maintain standing balalnce 2/2 L lateral lean                           ADL either performed or assessed with clinical judgement   ADL Overall ADL's : Needs assistance/impaired                                       General ADL  Comments: MAX A don/doff gown at bed level + VCs for attention to L side.      Vision       Perception     Praxis      Cognition Arousal/Alertness: Awake/alert Behavior During Therapy: WFL for tasks assessed/performed;Impulsive Overall Cognitive Status: Impaired/Different from baseline Area of Impairment: Problem solving;Following commands;Safety/judgement;Awareness                       Following Commands: Follows one step commands with increased time Safety/Judgement: Decreased  awareness of safety;Decreased awareness of deficits Awareness: Intellectual Problem Solving: Slow processing;Requires verbal cues;Requires tactile cues General Comments: pt continues to have impulsivity and poor safety awareness.        Exercises Exercises: Other exercises Other Exercises Other Exercises: bed mobility, L + R rolling, sup<>sit, sit<>standx4, UBD, BUE AAROM   Shoulder Instructions       General Comments      Pertinent Vitals/ Pain       Pain Assessment: Faces Faces Pain Scale: Hurts a little bit Pain Location: R ear Pain Intervention(s): Limited activity within patient's tolerance;Repositioned  Home Living                                          Prior Functioning/Environment              Frequency  Min 3X/week        Progress Toward Goals  OT Goals(current goals can now be found in the care plan section)  Progress towards OT goals: Progressing toward goals  Acute Rehab OT Goals Patient Stated Goal: I want to go home" OT Goal Formulation: With patient Time For Goal Achievement: 07/06/19 Potential to Achieve Goals: Fair ADL Goals Pt Will Perform Grooming: sitting;with adaptive equipment;with supervision;with set-up(c LRAD PRN for safety and improved fxl independence.) Pt Will Transfer to Toilet: ambulating;with min assist;bedside commode(c LRAD PRN for safety and improved fxl independence.) Pt Will Perform Toileting - Clothing Manipulation and hygiene: sit to/from stand;with min guard assist(c LRAD PRN for safety and improved fxl independence.)  Plan Discharge plan remains appropriate;Frequency remains appropriate    Co-evaluation                 AM-PAC OT "6 Clicks" Daily Activity     Outcome Measure   Help from another person eating meals?: A Little Help from another person taking care of personal grooming?: A Little Help from another person toileting, which includes using toliet, bedpan, or urinal?: A Lot Help  from another person bathing (including washing, rinsing, drying)?: A Lot Help from another person to put on and taking off regular upper body clothing?: A Little Help from another person to put on and taking off regular lower body clothing?: A Lot 6 Click Score: 15    End of Session    OT Visit Diagnosis: Other abnormalities of gait and mobility (R26.89);Hemiplegia and hemiparesis Hemiplegia - Right/Left: Left Hemiplegia - dominant/non-dominant: Dominant Hemiplegia - caused by: Cerebral infarction   Activity Tolerance Patient tolerated treatment well   Patient Left in bed;with call bell/phone within reach;with nursing/sitter in room(RN in room at end of session )   Nurse Communication          Time: 1610-9604 OT Time Calculation (min): 19 min  Charges: OT General Charges $OT Visit: 1 Visit OT Treatments $Self Care/Home Management : 8-22 mins  Dessie Coma, M.S. OTR/L  07/01/19, 4:38 PM

## 2019-07-01 NOTE — Progress Notes (Signed)
PROGRESS NOTE    Kent [name]  ZOX:[id number] DOB: [birth year] DOA: 06/22/2019 PCP: System, Pcp Not In    Brief Narrative: HPI: Kent [name] is a 64 y.o. 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  Pt had positive Covid19 test 4 days ago. He denies cough or shortness breath, but per RN in ED, pt has mild dry cough. No fever or chills. He states that he has been feeling weak and fatigue since then. He states tha0t he was asleep and when he woke up he feel weaker in left arm. He also states that he is having intermittent weakness in his legs bilaterally. He has dizziness when he stands up and walks. Per report, pt had possible slurry speech.  No vision loss or hearing loss. Pt reports incontinence of bladder and bowels.  No nausea, vomiting, or abdominal pain.  His son states that he had mild loose stool bowel movement at home.  Patient had oxygen desaturation to 86% on room air initially, currently 96% on room air.  Blood pressure 201/141, which improved to 123/93. Cardene gttt is ordered, but nopt started per RN in ED. HR 39 -108, RR21.  Chest x-ray showed right upper lobe patchy infiltration.  Patient is admitted to [place] bed as inpatient.  4/22: Patient seen and examined.  Still very sleepy and lethargic.  Does awaken easily and answers all my questions appropriately.  Neurosurgery consulted for aneurysms noted on CT angio.  Further initial review of imaging no indication for intervention.  Aneurysms appear small.  Recommend follow-up for neck mass noted on CT.  4/23: Patient seen and examined.  Sleeping on my arrival but easily awakened.  Answers questions appropriately.  Patient has been seen by a oncology, neurology, neurosurgery.  Running  hypertensive, permissive in setting of stroke.  4/24: Patient seen and examined.  Easily awakened.  Continues to endorse fatigue.  Answers all questions appropriately.  Blood pressure improved over interval.  4/25: Patient seen and examined.  Sleeping but easily awakened.  Reports some improvement in his fatigue.  [place] working with physical therapy.  Pending skilled nursing facility placement.    4/26: Patient seen and examined.  Sleeping but easily arousable.  Working actively with physical therapy.  Pending skilled nursing facility placement.  4/27: Patient seen and examined.  Still lethargic with significant unilateral weakness.   Lack of safe discharge plan at this time.   Assessment & Plan:   Principal Problem:   Stroke [facility]) Active Problems:   Hypertension   Hyperlipidemia   Hypokalemia   Hypertensive urgency   Acute respiratory failure with hypoxia (HCC)   Diabetes mellitus without complication (HCC)   Chronic diastolic CHF (congestive heart failure) (HCC)   Pneumonia due to COVID-19 virus  Stroke MRI of brain showed multifocal changes of acute/early subacute infarction within the right PCA territory involving the right occipital and temporal lobes.  Small amount of associated petechial hemorrhage within the right occipital lobe.  Acute/early subacute infarcts are also present within the right thalamus.   Neurology, Dr. Doy Mince is consulted. Plan: Echo with bubble, Ef 55%, no WMA, negative bubble Permissive hypertension for 48 hours. continue home ASA (pt was given 324 mg of ASA already) fasting lipid panel: no HLD noted  HbA1c 9.2, poor control of DM RN  swallow screen, passed, diet advanced UDS: negative PT and OT consults: rec SNF.  Unfortunately after discussion with case management appears patient is uninsured and thus SNF may not be a possibility.  We attempted to refer to CIR however due to CIR's policies patient need to be 20 days out from a positive Covid test  which would mean the earliest the patient can dispo to CIR would be Jul 19, 2019.  After reevaluation by physical therapy and Occupational Therapy they feel that discharge home is not a safe option.  I discussed this over the phone with the wife who stated that she was not informed of any of this.  I told her that I had been in close contact with the patient's son [name] and the case management had also been talking to them.  Unclear to me why wife was not aware of the need this information.  I have asked case management to be in contact with the wife and explained that options are greatly limited in this case.  Case management will continue to work on a safe discharge plan for patient however we may be in a situation where we are forced to discharge this patient home with home health services.  Hypertension and Hypertensive urgency Hypertensive urgency resolved Permissive hypertension allowed for 48 hours Restarted home losartan Continue as needed IV hydralazine  Hyperlipidemia: - Lipitor 40 mg daily  Hypokalemia:  K= 3.2 on admission. - Repleted - Check Mg level: NL  Acute respiratory failure with hypoxia due to [place] due to COVID-19 virus  initially pt had oxygen desaturation to 86% on room air.   Chest x-ray showed right upper lobe patchy infiltration. Completed remdesivir, received Solumedrol 40 mg bid for many days. - vitamin C, zinc.  - Bronchodilators - PRN Mucinex for cough  Diabetes mellitus without complication (HCC)  Most recent A1c 9.2, poorly controled.  Patient is taking Metformin at home - Lantus 8U qHS -SSI - Hold home metformin  Chronic diastolic CHF (congestive heart failure) ([place]) 2D echo on 01/09/2016 showed EF of 45-50%.   No leg edema or JVD.   CHF seems to be compensated.  History of SCC head and neck History of GIST Per patients son he has been lost to onc followup since 2017 CT angio neck demonstrates soft tissue mass concerning for tumor  recurrence Patients last visit to oncology at [facility] 2017 Oncology reviewed CAT scan Concerning for recurrence Patient will need outpatient PET scan  DVT prophylaxis: lovenox Code Status: Partial Family Communication: Patient's son Aiden Helzer via phone [phone] on 06/27/2019.  Patient's wife [name] via phone [phone] on 4/27 Disposition Plan: Status is: Inpatient  Remains inpatient appropriate because:Unsafe d/c plan   Dispo: The patient is from: Home              Anticipated d/c is to: SNF              Anticipated d/c date is: uncertain              Patient currently is medically stable to d/c.   See above assessment and plan.  From medical standpoint patient can discharge however there is lack of a safe discharge plan here.  Unfortunately the patient is uninsured this will likely not be accepted to a skilled nursing facility bed.  CIR is unable to take him due to Covid positive status.  Physical therapy and Occupational Therapy are both stated that home health is likely not a safe dispo plan.  We are limited in options.  Case management looking for safe discharge plan.   Consultants:   Neurology  NSG  Oncology   Procedures:   2D echo with bubble  Antimicrobials:   Remdesivir    Subjective: Pt reported being uncomfortable all night, and said it was the bed.  Also complained of right ear pain, felt like something was stuck in there.  Complained of being tired, but did work with PT and improving.  No fever, chest pain, abdominal pain, N/V/D.   Objective: Vitals:   06/30/19 0822 06/30/19 1657 06/30/19 2000 07/01/19 0805  BP: 104/76 101/72 103/80 (!) 131/92  Pulse: 75 67 77 80  Resp: 18 20 20 18   Temp: 98.6 F (37 C) 98.1 F (36.7 C) 98.1 F (36.7 C) 98.6 F (37 C)  TempSrc:  Oral Oral Oral  SpO2: 99% 97% 96% 97%  Weight:      Height:        Intake/Output Summary (Last 24 hours) at 07/01/2019 1643 Last data filed at 07/01/2019 0900 Gross per 24 hour    Intake --  Output 1300 ml  Net -1300 ml   Filed Weights   06/22/19 0523  Weight: 81.6 kg    Examination:  Constitutional: NAD, alert, oriented HEENT: conjunctivae and lids normal, EOMI CV: RRR no M,R,G. Distal pulses +2.  No cyanosis.   RESP: clear, normal respiratory effort  GI: +BS, NTND Extremities: No effusions, edema, or tenderness in BLE SKIN: warm, dry and intact Neuro: II - XII grossly intact.     Data Reviewed: I have personally reviewed following labs and imaging studies  CBC: Recent Labs  Lab 06/25/19 0654 06/26/19 0507 06/27/19 0625  WBC 15.1* 14.6* 15.8*  NEUTROABS 13.0* 12.8* 13.8*  HGB 14.6 14.4 15.5  HCT 41.3 39.6 43.7  MCV 82.8 80.7 81.8  PLT 383 355 595   Basic Metabolic Panel: Recent Labs  Lab 06/25/19 0654 06/26/19 0507 06/27/19 0625  NA 133* 134* 135  K 3.9 3.5 3.5  CL 95* 98 96*  CO2 27 27 29   GLUCOSE 173* 189* 176*  BUN 23 27* 32*  CREATININE 1.13 1.01 1.03  CALCIUM 8.7* 8.4* 8.5*   GFR: Estimated Creatinine Clearance: 74.1 mL/min (by C-G formula based on SCr of 1.03 mg/dL). Liver Function Tests: Recent Labs  Lab 06/25/19 0654 06/26/19 0507 06/27/19 0625  AST 37 33 27  ALT 25 29 32  ALKPHOS 78 72 77  BILITOT 1.1 1.0 1.0  PROT 6.7 6.3* 7.1  ALBUMIN 3.8 3.6 4.0   No results for input(s): LIPASE, AMYLASE in the last 168 hours. No results for input(s): AMMONIA in the last 168 hours. Coagulation Profile: No results for input(s): INR, PROTIME in the last 168 hours. Cardiac Enzymes: No results for input(s): CKTOTAL, CKMB, CKMBINDEX, TROPONINI in the last 168 hours. BNP (last 3 results) No results for input(s): PROBNP in the last 8760 hours. HbA1C: No results for input(s): HGBA1C in the last 72 hours. CBG: Recent Labs  Lab 06/30/19 1215 06/30/19 1653 06/30/19 2003 07/01/19 0802 07/01/19 1135  GLUCAP 119* 150* 125* 101* 119*   Lipid Profile: No results for input(s): CHOL, HDL, LDLCALC, TRIG, CHOLHDL, LDLDIRECT in  the last 72 hours. Thyroid Function Tests: No results for input(s): TSH, T4TOTAL, FREET4, T3FREE, THYROIDAB in the last 72 hours. Anemia Panel: No results for input(s): VITAMINB12, FOLATE, FERRITIN, TIBC, IRON, RETICCTPCT in the last 72 hours. Sepsis Labs: No results for input(s): PROCALCITON, LATICACIDVEN in the last 168 hours.  Recent Results (from the past 240 hour(s))  Respiratory Panel by RT PCR (Flu A&B, Covid) - Urine, Clean Catch     Status: Abnormal   Collection Time: 06/22/19  5:10 AM   Specimen: Urine, Clean Catch  Result Value Ref Range Status   SARS Coronavirus 2 by RT PCR POSITIVE (A) NEGATIVE Final    Comment: RESULT CALLED TO, READ BACK BY AND VERIFIED WITH: GREG MOYER AT 1025 06/22/19 SDR (NOTE) SARS-CoV-2 target nucleic acids are DETECTED. SARS-CoV-2 RNA is generally detectable in upper respiratory specimens  during the acute phase of infection. Positive results are indicative of the presence of the identified virus, but do not rule out bacterial infection or co-infection with other pathogens not detected by the test. Clinical correlation with patient history and other diagnostic information is necessary to determine patient infection status. The expected result is Negative. Fact Sheet for Patients:  PinkCheek.be Fact Sheet for Healthcare Providers: GravelBags.it This test is not yet approved or cleared by the [place] FDA and  has been authorized for detection and/or diagnosis of SARS-CoV-2 by FDA under an Emergency Use Authorization (EUA).  This EUA will remain in effect (meaning this test can be used) for th e duration of  the COVID-19 declaration under Section 564(b)(1) of the Act, 21 U.S.C. section 360bbb-3(b)(1), unless the authorization is terminated or revoked sooner.    Influenza A by PCR NEGATIVE NEGATIVE Final   Influenza B by PCR NEGATIVE NEGATIVE Final    Comment: (NOTE) The Xpert  Xpress SARS-CoV-2/FLU/RSV assay is intended as an aid in  the diagnosis of influenza from Nasopharyngeal swab specimens and  should not be used as a sole basis for treatment. Nasal washings and  aspirates are unacceptable for Xpert Xpress SARS-CoV-2/FLU/RSV  testing. Fact Sheet for Patients: PinkCheek.be Fact Sheet for Healthcare Providers: GravelBags.it This test is not yet approved or cleared by the [place] FDA and  has been authorized for detection and/or diagnosis of SARS-CoV-2 by  FDA under an Emergency Use Authorization (EUA). This EUA will remain  in effect (meaning this test can be used) for the duration of the  Covid-19 declaration under Section 564(b)(1) of the Act, 21  U.S.C. section 360bbb-3(b)(1), unless the authorization is  terminated or revoked. Performed at [facility], [address] [place], [place] [address]   Blood culture (routine x 2)     Status: None   Collection Time: 06/22/19  6:55 AM   Specimen: BLOOD  Result Value Ref Range Status   Specimen Description BLOOD LEFT [facility]  Final   Special Requests   Final    BOTTLES DRAWN AEROBIC AND ANAEROBIC Blood Culture adequate volume   Culture   Final    NO GROWTH 5 DAYS Performed at [facility], [address], [place], [place] [address]    Report Status 06/27/2019 FINAL  Final  Blood culture (routine x 2)     Status: None   Collection Time: 06/22/19  6:55 AM   Specimen: BLOOD  Result Value Ref Range Status   Specimen Description BLOOD LEFT ARM  Final   Special Requests   Final    BOTTLES DRAWN AEROBIC AND ANAEROBIC Blood Culture adequate volume   Culture   Final    NO GROWTH 5 DAYS Performed at [facility], [address] [place], [place] [address]    Report Status 06/27/2019 FINAL  Final         Radiology Studies: No results found.  Scheduled Meds: . vitamin C  500 mg Oral Daily  . aspirin EC   81 mg Oral Daily  . atorvastatin  40 mg Oral Daily  . bisacodyl  10 mg Rectal Once  . enoxaparin (LOVENOX) injection  40 mg Subcutaneous Q24H  . insulin aspart  0-9 Units Subcutaneous TID WC  . insulin detemir  8 Units Subcutaneous QHS  . ipratropium  2 puff Inhalation Q4H  . losartan  50 mg Oral Daily  . oxymetazoline  1 spray Each Nare BID  . polyethylene glycol  17 g Oral BID  . zinc sulfate  220 mg Oral Daily   Continuous Infusions:    LOS: 9 days     Enzo Bi, MD [facility] Pager [phone]  If 7PM-7AM, please contact night-coverage 07/01/2019, 4:43 PM

## 2019-07-01 NOTE — Progress Notes (Signed)
Inpatient Rehabilitation Admissions Coordinator  We will sign off as per our protocol to admit post COVID when >20 days from onset and have recovered which would be 5/11. Discused with SW.   Danne Baxter, RN, MSN Rehab Admissions Coordinator [phone] 07/01/2019 3:11 PM

## 2019-07-01 NOTE — Progress Notes (Signed)
Physical Therapy Treatment Patient Details Name: Kent [name] MRN: [id number] DOB: [birth year] Today's Date: 07/01/2019    History of Present Illness Kent [name] is an 64 y.o. 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  Patient left the ED, but later returned via [facility] with left extremity weakness. MRI of the brain showed acute/subacute infarcts in the right occipital, right temporal and right thalamic regions.  Some associated petechial hemorrhage noted.  Follow up CTA shows multiple areas of stenosis/occlusion including left vertebral origin moderate to severe, right PCA occlusion, left PCA severe, proximal right M2 moderate to severe.  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    PT Comments    Pt was asleep in L side lying upon arriving. He easily awakes and agrees to PT session. Pt does c/o R ear pain. Both MD/RN aware. Overall pt was more sleepy/lethargic this date. Per RN, he was given morphine previous night 2/2 to being unable to get comfortable. Pt was able to roll L to short sit with min assist. vcs throughout for improved technique, slow down and for sequencing. He stood 5x EOB with LUE HHA and therapist blocking L knee from buckling/ for safety. Continues to have severe balance deficits and poor insight of deficits.Overall tolerated session well but quickly falls asleep at conclusion. He was repositioned in bed post session with bed alarm in place and MD in room. Acute PT will continue to progress pt as able per POC.  Pt will benefit from as much skilled PT as possible to improve safe functional mobility.     Follow Up Recommendations  CIR;SNF;Supervision/Assistance - 24  hour;Supervision for mobility/OOB     Equipment Recommendations  Rolling walker with 5" wheels;3in1 (PT);Wheelchair (measurements PT);Wheelchair cushion (measurements PT)    Recommendations for Other Services       Precautions / Restrictions Precautions Precautions: Fall Precaution Comments: High fall Restrictions Weight Bearing Restrictions: No    Mobility  Bed Mobility Overal bed mobility: Needs Assistance Bed Mobility: Supine to Sit   Sidelying to sit: Min guard Supine to sit: Min assist Sit to supine: Min assist Sit to sidelying: Min assist General bed mobility comments: Pt was able to exit L side of bed with incraesed time and vcs for technique and sequencing. continues to be impulsive with LUE/LLE ataxic movements.  Transfers Overall transfer level: Needs assistance Equipment used: 1 person hand held assist Transfers: Sit to/from Stand Sit to Stand: Min assist         General transfer comment: Min assist to STS 5 x EOB. pt required more vcs this date for safety and handplacement. several times required vcs to open eyes and try to stay awake. Pt more lethargic overall this date and fatigued quick.  Ambulation/Gait             General Gait Details: unsafe/unable to safely ambulate   Stairs             Wheelchair Mobility    Modified Rankin (Stroke Patients Only)       Balance Overall balance assessment: Needs assistance Sitting-balance support: Feet supported;Bilateral upper extremity supported Sitting balance-Leahy Scale: Fair Sitting balance - Comments: CGA for safety to maintain sitting balance EOB   Standing balance  support: Single extremity supported Standing balance-Leahy Scale: Poor Standing balance comment: severe L lateral lean in standing                            Cognition Arousal/Alertness: Awake/alert(more lethargic overall this date 2/2 to given morphine per R) Behavior During Therapy: [facility] for tasks  assessed/performed;Impulsive Overall Cognitive Status: Impaired/Different from baseline Area of Impairment: Problem solving;Following commands;Safety/judgement;Awareness                       Following Commands: Follows one step commands with increased time Safety/Judgement: Decreased awareness of safety;Decreased awareness of deficits Awareness: Intellectual Problem Solving: Slow processing;Requires verbal cues;Requires tactile cues General Comments: pt continues to have impulsivity and poor safety awareness.      Exercises      General Comments        Pertinent Vitals/Pain Pain Assessment: 0-10 Pain Score: 4  Pain Location: R ear Pain Intervention(s): Limited activity within patient's tolerance;Repositioned    Home Living                      Prior Function            PT Goals (current goals can now be found in the care plan section) Acute Rehab PT Goals Patient Stated Goal: I want to go home" Progress towards PT goals: Progressing toward goals    Frequency    7X/week      PT Plan Current plan remains appropriate    Co-evaluation     PT goals addressed during session: Mobility/safety with mobility;Balance;Proper use of DME;Strengthening/ROM        AM-PAC PT "6 Clicks" Mobility   Outcome Measure  Help needed turning from your back to your side while in a flat bed without using bedrails?: A Little Help needed moving from lying on your back to sitting on the side of a flat bed without using bedrails?: A Little Help needed moving to and from a bed to a chair (including a wheelchair)?: A Lot Help needed standing up from a chair using your arms (e.g., wheelchair or bedside chair)?: A Lot Help needed to walk in hospital room?: A Lot Help needed climbing 3-5 steps with a railing? : Total 6 Click Score: 13    End of Session Equipment Utilized During Treatment: Gait belt Activity Tolerance: Patient limited by fatigue Patient left: in  bed;with call bell/phone within reach;with bed alarm set;with nursing/sitter in room Nurse Communication: Mobility status PT Visit Diagnosis: Unsteadiness on feet (R26.81);Difficulty in walking, not elsewhere classified (R26.2);Hemiplegia and hemiparesis Hemiplegia - Right/Left: Left Hemiplegia - dominant/non-dominant: Dominant Hemiplegia - caused by: Cerebral infarction     Time: 1135-1200 PT Time Calculation (min) (ACUTE ONLY): 25 min  Charges:  $Therapeutic Activity: 8-22 mins $Neuromuscular Re-education: 8-22 mins                    Julaine Fusi PTA 07/01/19, 2:42 PM

## 2019-07-01 NOTE — TOC Progression Note (Signed)
Transition of Care [facility]) - Progression Note    Patient Details  Name: Kent [name] MRN: [id number] Date of Birth: Jul 04, 1955  Transition of Care [facility] CM/SW Contact  Barba Solt, Gardiner Rhyme, LCSW Phone Number: 07/01/2019, 3:14 PM  Clinical Narrative:   Left message for [facility] regarding taking pt with [facility]. Awaiting return call and will work on [place].    Expected Discharge Plan: [place] Barriers to Discharge: Inadequate or no insurance, SNF Pending payor source - [facility]  Expected Discharge Plan and Services Expected Discharge Plan: [place] In-house Referral: Clinical Social Work   Post Acute Care Choice: [place] arrangements for the past 2 months: Single Family Home                                       Social Determinants of Health (SDOH) Interventions    Readmission Risk Interventions No flowsheet data found.

## 2019-07-01 NOTE — Progress Notes (Signed)
Attempted to call and update spouse, no answer.

## 2019-07-02 LAB — BASIC METABOLIC PANEL
Anion gap: 9 (ref 5–15)
BUN: 20 mg/dL (ref 8–23)
CO2: 27 mmol/L (ref 22–32)
Calcium: 8 mg/dL — ABNORMAL LOW (ref 8.9–10.3)
Chloride: 98 mmol/L (ref 98–111)
Creatinine, Ser: 1.02 mg/dL (ref 0.61–1.24)
GFR calc Af Amer: >60 mL/min (ref >60)
GFR calc non Af Amer: >60 mL/min (ref >60)
Glucose, Bld: 108 mg/dL — ABNORMAL HIGH (ref 70–99)
Potassium: 3.3 mmol/L — ABNORMAL LOW (ref 3.5–5.1)
Sodium: 134 mmol/L — ABNORMAL LOW (ref 135–145)

## 2019-07-02 LAB — CBC
HCT: 43 % (ref 39.0–52.0)
Hemoglobin: 15 g/dL (ref 13.0–17.0)
MCH: 29.2 pg (ref 26.0–34.0)
MCHC: 34.9 g/dL (ref 30.0–36.0)
MCV: 83.7 fL (ref 80.0–100.0)
Platelets: 230 10*3/uL (ref 150–400)
RBC: 5.14 MIL/uL (ref 4.22–5.81)
RDW: 13.2 % (ref 11.5–15.5)
WBC: 10.7 10*3/uL — ABNORMAL HIGH (ref 4.0–10.5)
nRBC: 0 % (ref 0.0–0.2)

## 2019-07-02 LAB — GLUCOSE, CAPILLARY
Glucose-Capillary: 136 mg/dL — ABNORMAL HIGH (ref 70–99)
Glucose-Capillary: 122 mg/dL — ABNORMAL HIGH (ref 70–99)

## 2019-07-02 LAB — MAGNESIUM: Magnesium: 2.3 mg/dL (ref 1.7–2.4)

## 2019-07-02 NOTE — Progress Notes (Signed)
Physical Therapy Treatment Patient Details Name: Kent [name] MRN: [id number] DOB: 1955-11-13 Today's Date: 07/02/2019    History of Present Illness Kent [name] is an 64 y.o. 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  Patient left the ED, but later returned via [facility] with left extremity weakness. MRI of the brain showed acute/subacute infarcts in the right occipital, right temporal and right thalamic regions.  Some associated petechial hemorrhage noted.  Follow up CTA shows multiple areas of stenosis/occlusion including left vertebral origin moderate to severe, right PCA occlusion, left PCA severe, proximal right M2 moderate to severe.  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    PT Comments    Pt was supine in bed with HOB elevated ~ 30 degrees upon arriving. He has eyes closed but reports being awake. He continues to be lethargic throughout most of session but does fully participate. Quickly falls asleep after OOB activity once returned to supine position. Progress with PT has been limited by fatigue and cognition. Pt is very impulsive and has poor insight to deficits." I think if I just soaked my LUE/LLE in some warm water, they will start working again." Therapist spent several minutes educating pt on deficits and concerns but pt will need more reinforcement in future sessions. He was able to stand 5 x EOB with min assist for ~ 1 minute each trial. Unable to safely progress to ambulation or activity away from EOB 2/2 to ataxia and impulsivity. Pt remains high fall risk. He fatigues quickly and once returned to supine falls asleep quickly. Acute PT will continue to follow per current POC and  progress as able per pt tolerance. Progress continues to be limited but pt is motivated and continues to work hard with therapies. Recommend continued skilled PT at [place] to address safe functional mobility deficits.      Follow Up Recommendations  CIR;SNF;Supervision/Assistance - 24 hour;Supervision for mobility/OOB     Equipment Recommendations  Rolling walker with 5" wheels;3in1 (PT);Wheelchair (measurements PT);Wheelchair cushion (measurements PT)    Recommendations for Other Services       Precautions / Restrictions Precautions Precautions: Fall Precaution Comments: High fall Restrictions Weight Bearing Restrictions: No    Mobility  Bed Mobility Overal bed mobility: Needs Assistance Bed Mobility: Supine to Sit       Sit to supine: Min assist   General bed mobility comments: Pt continues to require min assist for safety with mod-max vcs for correct performance of supine to side lying to short sit. INcreased time with tactile and vcs for sequencing and technique.  Transfers Overall transfer level: Needs assistance Equipment used: 1 person hand held assist;Rolling walker (2 wheeled) Transfers: Sit to/from Stand Sit to Stand: Min assist         General transfer comment: Pt performed STS 5 x EOB. trialed STS to RW however pt unable to grasp L handle of RW and maintain. He requires min assist + moderate vcs for safety and technique. only able to tolerate standing ~ 1 minute each trial prior to reporting fatigue and requesting seated rest. Pt continues to be unable to attempt activity away form EOB 2/2 to ataxia/uncoordinated movements of LUE/LLE. Therapist elected not to have pt sit in recliner 2/2  to impulsiveity and history of falling out of chair.  Ambulation/Gait             General Gait Details: unable/unsafe to progress    Stairs             Wheelchair Mobility    Modified Rankin (Stroke Patients Only)       Balance Overall balance assessment:  Needs assistance Sitting-balance support: Feet supported;Single extremity supported Sitting balance-Leahy Scale: Fair Sitting balance - Comments: pt continues to present with fair sitting balance with RUE support and feet supported on floor. Impulsivity make him high fall risk in sitting.   Standing balance support: Single extremity supported;Bilateral upper extremity supported Standing balance-Leahy Scale: Poor Standing balance comment: pt is high fall risk with poor balance noted. Less severe L lateral lean however poor standing balance with single and double UE support.                            Cognition Arousal/Alertness: Lethargic Behavior During Therapy: WFL for tasks assessed/performed;Impulsive Overall Cognitive Status: Impaired/Different from baseline Area of Impairment: Problem solving;Following commands;Safety/judgement;Awareness                       Following Commands: Follows one step commands with increased time Safety/Judgement: Decreased awareness of safety;Decreased awareness of deficits Awareness: Intellectual Problem Solving: Slow processing;Requires verbal cues;Requires tactile cues General Comments: Pt continues to present with increased lethargy and fatigues quickly with minimal activity. He remains cooperative and pleasant but impulsive with poor insight of deficits. " If I just soak my L arm/L leg in warm water, I think they will start working better." Therapist discussed how CVA has impacted L side but will need continued re-inforcement. High fall risk 2/2 to cognition/ poor safety awareness      Exercises Other Exercises Other Exercises: Therapist had pt performing ther ex in bed to promote return of strength/coordination of LUE/LLE. he tolerated performing well but does fatigue quickly.    General Comments        Pertinent Vitals/Pain Pain Assessment: No/denies pain Pain Score: 0-No pain    Home Living                       Prior Function            PT Goals (current goals can now be found in the care plan section) Acute Rehab PT Goals Patient Stated Goal: I want to go home" Progress towards PT goals: Not progressing toward goals - comment(cognition/impulsivity slowing progression)    Frequency    7X/week      PT Plan Current plan remains appropriate    Co-evaluation     PT goals addressed during session: Mobility/safety with mobility;Balance;Proper use of DME;Strengthening/ROM        AM-PAC PT "6 Clicks" Mobility   Outcome Measure  Help needed turning from your back to your side while in a flat bed without using bedrails?: A Little Help needed moving from lying on your back to sitting on the side of a flat bed without using bedrails?: A Little Help needed moving to and from a bed to a chair (including a wheelchair)?: A Lot Help needed standing up from a chair using your arms (e.g., wheelchair or bedside chair)?: A Lot Help needed to walk in hospital room?: A Lot Help needed climbing 3-5 steps with a railing? : Total 6 Click Score: 13  End of Session Equipment Utilized During Treatment: Gait belt Activity Tolerance: Patient limited by fatigue Patient left: in bed;with call bell/phone within reach;with bed alarm set;with nursing/sitter in room Nurse Communication: Mobility status PT Visit Diagnosis: Unsteadiness on feet (R26.81);Difficulty in walking, not elsewhere classified (R26.2);Hemiplegia and hemiparesis Hemiplegia - Right/Left: Left Hemiplegia - dominant/non-dominant: Dominant Hemiplegia - caused by: Cerebral infarction     Time: 2376-2831 PT Time Calculation (min) (ACUTE ONLY): 23 min  Charges:  $Therapeutic Activity: 8-22 mins $Neuromuscular Re-education: 8-22 mins                    Julaine Fusi PTA 07/02/19, 11:42 AM

## 2019-07-02 NOTE — Progress Notes (Signed)
PROGRESS NOTE    Kent [name]  IEP:[id number] DOB: 1955/09/25 DOA: 06/22/2019 PCP: System, Pcp Not In    Brief Narrative: HPI: Kent [name] is a 64 y.o. 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  Pt had positive Covid19 test 4 days ago. He denies cough or shortness breath, but per RN in ED, pt has mild dry cough. No fever or chills. He states that he has been feeling weak and fatigue since then. He states tha0t he was asleep and when he woke up he feel weaker in left arm. He also states that he is having intermittent weakness in his legs bilaterally. He has dizziness when he stands up and walks. Per report, pt had possible slurry speech.  No vision loss or hearing loss. Pt reports incontinence of bladder and bowels.  No nausea, vomiting, or abdominal pain.  His son states that he had mild loose stool bowel movement at home.  Patient had oxygen desaturation to 86% on room air initially, currently 96% on room air.  Blood pressure 201/141, which improved to 123/93. Cardene gttt is ordered, but nopt started per RN in ED. HR 39 -108, RR21.  Chest x-ray showed right upper lobe patchy infiltration.  Patient is admitted to [facility] bed as inpatient.  4/22: Patient seen and examined.  Still very sleepy and lethargic.  Does awaken easily and answers all my questions appropriately.  Neurosurgery consulted for aneurysms noted on CT angio.  Further initial review of imaging no indication for intervention.  Aneurysms appear small.  Recommend follow-up for neck mass noted on CT.  4/23: Patient seen and examined.  Sleeping on my arrival but easily awakened.  Answers questions appropriately.  Patient has been seen by a oncology, neurology, neurosurgery.  Running  hypertensive, permissive in setting of stroke.  4/24: Patient seen and examined.  Easily awakened.  Continues to endorse fatigue.  Answers all questions appropriately.  Blood pressure improved over interval.  4/25: Patient seen and examined.  Sleeping but easily awakened.  Reports some improvement in his fatigue.  [place] working with physical therapy.  Pending skilled nursing facility placement.    4/26: Patient seen and examined.  Sleeping but easily arousable.  Working actively with physical therapy.  Pending skilled nursing facility placement.  4/27: Patient seen and examined.  Still lethargic with significant unilateral weakness.   Lack of safe discharge plan at this time.   Assessment & Plan:   Principal Problem:   Stroke [facility]) Active Problems:   Hypertension   Hyperlipidemia   Hypokalemia   Hypertensive urgency   Acute respiratory failure with hypoxia (HCC)   Diabetes mellitus without complication (HCC)   Chronic diastolic CHF (congestive heart failure) (HCC)   Pneumonia due to COVID-19 virus  Stroke MRI of brain showed multifocal changes of acute/early subacute infarction within the right PCA territory involving the right occipital and temporal lobes.  Small amount of associated petechial hemorrhage within the right occipital lobe.  Acute/early subacute infarcts are also present within the right thalamus.   Neurology, Dr. Doy Mince is consulted. Plan: Echo with bubble, Ef 55%, no WMA, negative bubble Permissive hypertension for 48 hours. continue home ASA (pt was given 324 mg of ASA already) fasting lipid panel: no HLD noted  HbA1c 9.2, poor control of DM RN  swallow screen, passed, diet advanced UDS: negative PT and OT consults: rec SNF.  Unfortunately after discussion with case management appears patient is uninsured and thus SNF may not be a possibility.  We attempted to refer to CIR however due to CIR's policies patient need to be 20 days out from a positive Covid test  which would mean the earliest the patient can dispo to CIR would be Jul 19, 2019.  After reevaluation by physical therapy and Occupational Therapy they feel that discharge home is not a safe option.  I discussed this over the phone with the wife who stated that she was not informed of any of this.  I told her that I had been in close contact with the patient's son Kent [name] and the case management had also been talking to them.  Unclear to me why wife was not aware of the need this information.  I have asked case management to be in contact with the wife and explained that options are greatly limited in this case.  Case management will continue to work on a safe discharge plan for patient however we may be in a situation where we are forced to discharge this patient home with home health services.  Hypertension and Hypertensive urgency Hypertensive urgency resolved Permissive hypertension allowed for 48 hours Continue home losartan Continue as needed IV hydralazine  Hyperlipidemia: - Lipitor 40 mg daily  Hypokalemia:  --replete PRN  Acute respiratory failure with hypoxia due to [place] due to COVID-19 virus  initially pt had oxygen desaturation to 86% on room air.   Chest x-ray showed right upper lobe patchy infiltration. Completed remdesivir, received Solumedrol 40 mg bid for many days. - vitamin C, zinc.  - Bronchodilators - PRN Mucinex for cough  Diabetes mellitus without complication (HCC)  Most recent A1c 9.2, poorly controled.  Patient is taking Metformin at home - Lantus 8U qHS -SSI - Hold home metformin  Chronic diastolic CHF (congestive heart failure) ([place]) 2D echo on 01/09/2016 showed EF of 45-50%.   No leg edema or JVD.   CHF seems to be compensated.  History of SCC head and neck History of GIST Per patients son he has been lost to onc followup since 2017 CT angio neck demonstrates soft tissue mass concerning for tumor recurrence Patients last visit to oncology at  [facility] 2017 Oncology reviewed CAT scan Concerning for recurrence Patient will need outpatient PET scan  DVT prophylaxis: lovenox Code Status: Partial Family Communication:  Disposition Plan: Status is: Inpatient  Remains inpatient appropriate because:Unsafe d/c plan   Dispo: The patient is from: Home              Anticipated d/c is to: SNF              Anticipated d/c date is: uncertain              Patient currently is medically stable to d/c.   See above assessment and plan.  From medical standpoint patient can discharge however there is lack of a safe discharge plan here.  Unfortunately the patient is uninsured this will likely not be accepted to a skilled nursing facility bed.  [facility] is unable to take him due to Covid positive status.  Physical therapy and Occupational Therapy are both stated that home health is likely not a safe dispo plan.  We are limited in options.  Case management looking for safe discharge plan.   Consultants:   Neurology  [place]  Oncology   Procedures:  2D echo with bubble  Antimicrobials:   Remdesivir    Subjective: Pt again complained of being tired, and wanted to sleep.  Not forth-coming with answering questions.  No noted fever, N/V/D.   Objective: Vitals:   07/01/19 0805 07/01/19 1731 07/02/19 0109 07/02/19 0915  BP: (!) 131/92 129/84 (!) 130/103 (!) 170/97  Pulse: 80 84 79 69  Resp: 18 18 16 14   Temp: 98.6 F (37 C) 98.3 F (36.8 C) 98.6 F (37 C) 98.8 F (37.1 C)  TempSrc: Oral Oral Oral   SpO2: 97% 94% 100% 99%  Weight:      Height:       No intake or output data in the 24 hours ending 07/02/19 1428 Filed Weights   06/22/19 0523  Weight: 81.6 kg    Examination:  Constitutional: NAD, sleepy, arousable HEENT: conjunctivae and lids normal, EOMI CV: RRR no M,R,G. Distal pulses +2.  No cyanosis.   RESP: clear, normal respiratory effort  GI: +BS, NTND Extremities: No effusions, edema, or tenderness in BLE SKIN: warm, dry  and intact Neuro: II - XII grossly intact.     Data Reviewed: I have personally reviewed following labs and imaging studies  CBC: Recent Labs  Lab 06/26/19 0507 06/27/19 0625 07/02/19 0554  WBC 14.6* 15.8* 10.7*  NEUTROABS 12.8* 13.8*  --   HGB 14.4 15.5 15.0  HCT 39.6 43.7 43.0  MCV 80.7 81.8 83.7  PLT 355 397 268   Basic Metabolic Panel: Recent Labs  Lab 06/26/19 0507 06/27/19 0625 07/02/19 0554  NA 134* 135 134*  K 3.5 3.5 3.3*  CL 98 96* 98  CO2 27 29 27   GLUCOSE 189* 176* 108*  BUN 27* 32* 20  CREATININE 1.01 1.03 1.02  CALCIUM 8.4* 8.5* 8.0*  MG  --   --  2.3   GFR: Estimated Creatinine Clearance: 74.8 mL/min (by C-G formula based on SCr of 1.02 mg/dL). Liver Function Tests: Recent Labs  Lab 06/26/19 0507 06/27/19 0625  AST 33 27  ALT 29 32  ALKPHOS 72 77  BILITOT 1.0 1.0  PROT 6.3* 7.1  ALBUMIN 3.6 4.0   No results for input(s): LIPASE, AMYLASE in the last 168 hours. No results for input(s): AMMONIA in the last 168 hours. Coagulation Profile: No results for input(s): INR, PROTIME in the last 168 hours. Cardiac Enzymes: No results for input(s): CKTOTAL, CKMB, CKMBINDEX, TROPONINI in the last 168 hours. BNP (last 3 results) No results for input(s): PROBNP in the last 8760 hours. HbA1C: No results for input(s): HGBA1C in the last 72 hours. CBG: Recent Labs  Lab 06/30/19 1653 06/30/19 2003 07/01/19 0802 07/01/19 1135 07/01/19 1729  GLUCAP 150* 125* 101* 119* 182*   Lipid Profile: No results for input(s): CHOL, HDL, LDLCALC, TRIG, CHOLHDL, LDLDIRECT in the last 72 hours. Thyroid Function Tests: No results for input(s): TSH, T4TOTAL, FREET4, T3FREE, THYROIDAB in the last 72 hours. Anemia Panel: No results for input(s): VITAMINB12, FOLATE, FERRITIN, TIBC, IRON, RETICCTPCT in the last 72 hours. Sepsis Labs: No results for input(s): PROCALCITON, LATICACIDVEN in the last 168 hours.  No results found for this or any previous visit (from the  past 240 hour(s)).       Radiology Studies: No results found.      Scheduled Meds: . aspirin EC  81 mg Oral Daily  . atorvastatin  40 mg Oral Daily  . bisacodyl  10 mg Rectal Once  . enoxaparin (LOVENOX) injection  40 mg Subcutaneous Q24H  .  insulin aspart  0-9 Units Subcutaneous TID WC  . insulin detemir  8 Units Subcutaneous QHS  . ipratropium  2 puff Inhalation Q4H  . losartan  50 mg Oral Daily  . oxymetazoline  1 spray Each Nare BID  . polyethylene glycol  17 g Oral BID   Continuous Infusions:    LOS: 10 days     Enzo Bi, MD [facility] Pager [phone]  If 7PM-7AM, please contact night-coverage 07/02/2019, 2:28 PM

## 2019-07-03 LAB — GLUCOSE, CAPILLARY
Glucose-Capillary: 153 mg/dL — ABNORMAL HIGH (ref 70–99)
Glucose-Capillary: 111 mg/dL — ABNORMAL HIGH (ref 70–99)
Glucose-Capillary: 117 mg/dL — ABNORMAL HIGH (ref 70–99)

## 2019-07-03 NOTE — Progress Notes (Signed)
Physical Therapy Treatment Patient Details Name: Kent [name] MRN: [id number] DOB: 1955/06/03 Today's Date: 07/03/2019    History of Present Illness Kent [name] is an 64 y.o. 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  Patient left the ED, but later returned via [facility] with left extremity weakness. MRI of the brain showed acute/subacute infarcts in the right occipital, right temporal and right thalamic regions.  Some associated petechial hemorrhage noted.  Follow up CTA shows multiple areas of stenosis/occlusion including left vertebral origin moderate to severe, right PCA occlusion, left PCA severe, proximal right M2 moderate to severe.  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    PT Comments    In bed, easily awakes.  Agrees to session.  Pt spilled oatmeal in bed and covered gown. Pt stated several times he was sorry.  Encouragement given.  Tech called to provide linen and bath.  While waiting, participated in exercises as described below.  To EOB with min a x 1 for safety.  Steady in sitting today with overall better awareness of body positioning, safety and balance from when I last worked with pt.  No LOB in sitting today.  Stood 2 x 5 with RUE assist to stand.  He needs cues for foot positioning prior to standing and to stand fully.  Uses end of bed for support.  He often initiates sitting quickly upon standing and needs cues to remain upright for a max of 1 minute.  Attempted to sidestep up on bed but he is unable to step and support weight on L.  Pt fatigued and wanting to lay back down.  While sitting at end of bed, despite encouragement to slide up, he initiates laying down and ends up  with feet over end of bed.  He is however able independently scoot up in bed on his back using railings to assist.  Generally fatigued with effort.  Tech in to assist with bathing and linen change at end of session.   Follow Up Recommendations  CIR;SNF;Supervision/Assistance - 24 hour;Supervision for mobility/OOB     Equipment Recommendations  Rolling walker with 5" wheels;3in1 (PT);Wheelchair (measurements PT);Wheelchair cushion (measurements PT)    Recommendations for Other Services       Precautions / Restrictions Precautions Precautions: Fall Precaution Comments: High fall Restrictions Weight Bearing Restrictions: No    Mobility  Bed Mobility Overal bed mobility: Needs Assistance Bed Mobility: Supine to Sit     Supine to sit: Min assist Sit to supine: Min assist   General bed mobility comments: cues for sequencing and safety  Transfers Overall transfer level: Needs assistance Equipment used: 1 person hand held assist Transfers: Sit to/from Stand Sit to Stand: Min assist         General transfer comment: sit to stand 10 EOB with max stand 1 minute with encouragement to reach goal.  Ambulation/Gait             General Gait Details: uanble to sidestep along bed.  L knee buckles   Stairs             Wheelchair Mobility    Modified Rankin (Stroke Patients Only)       Balance Overall balance assessment: Needs assistance Sitting-balance support:  Feet supported;Single extremity supported Sitting balance-Leahy Scale: Fair     Standing balance support: Single extremity supported Standing balance-Leahy Scale: Poor Standing balance comment: pt is high fall risk with poor balance noted. Less severe L lateral lean however poor standing balance with single support                            Cognition Arousal/Alertness: Awake/alert Behavior During Therapy: WFL for tasks assessed/performed Overall Cognitive Status: Impaired/Different from  baseline                                        Exercises Other Exercises Other Exercises: BLE ex SLR, heel slides, ab/add with manual resistance,  bridges 4 x 10    General Comments        Pertinent Vitals/Pain Pain Assessment: No/denies pain    Home Living                      Prior Function            PT Goals (current goals can now be found in the care plan section) Progress towards PT goals: Progressing toward goals    Frequency    7X/week      PT Plan Current plan remains appropriate    Co-evaluation              AM-PAC PT "6 Clicks" Mobility   Outcome Measure  Help needed turning from your back to your side while in a flat bed without using bedrails?: A Little Help needed moving from lying on your back to sitting on the side of a flat bed without using bedrails?: A Little Help needed moving to and from a bed to a chair (including a wheelchair)?: A Lot Help needed standing up from a chair using your arms (e.g., wheelchair or bedside chair)?: A Lot Help needed to walk in hospital room?: A Lot Help needed climbing 3-5 steps with a railing? : Total 6 Click Score: 13    End of Session Equipment Utilized During Treatment: Gait belt Activity Tolerance: Patient limited by fatigue Patient left: in bed;with call bell/phone within reach;with bed alarm set Nurse Communication: Mobility status;Other (comment) Hemiplegia - Right/Left: Left Hemiplegia - dominant/non-dominant: Dominant Hemiplegia - caused by: Cerebral infarction     Time: 1235-1313 PT Time Calculation (min) (ACUTE ONLY): 38 min  Charges:  $Therapeutic Exercise: 23-37 mins $Therapeutic Activity: 8-22 mins                     Chesley Noon, PTA 07/03/19, 1:28 PM

## 2019-07-03 NOTE — Progress Notes (Signed)
PROGRESS NOTE    Kent [name]  GQQ:[id number] DOB: 07/01/1955 DOA: 06/22/2019 PCP: System, Pcp Not In    Brief Narrative: HPI: Kent [name] is a 64 y.o. 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  Pt had positive Covid19 test 4 days ago. He denies cough or shortness breath, but per RN in ED, pt has mild dry cough. No fever or chills. He states that he has been feeling weak and fatigue since then. He states tha0t he was asleep and when he woke up he feel weaker in left arm. He also states that he is having intermittent weakness in his legs bilaterally. He has dizziness when he stands up and walks. Per report, pt had possible slurry speech.  No vision loss or hearing loss. Pt reports incontinence of bladder and bowels.  No nausea, vomiting, or abdominal pain.  His son states that he had mild loose stool bowel movement at home.  Patient had oxygen desaturation to 86% on room air initially, currently 96% on room air.  Blood pressure 201/141, which improved to 123/93. Cardene gttt is ordered, but nopt started per RN in ED. HR 39 -108, RR21.  Chest x-ray showed right upper lobe patchy infiltration.  Patient is admitted to [facility] bed as inpatient.   Assessment & Plan:   Principal Problem:   Stroke [facility]) Active Problems:   Hypertension   Hyperlipidemia   Hypokalemia   Hypertensive urgency   Acute respiratory failure with hypoxia (HCC)   Diabetes mellitus without complication (HCC)   Chronic diastolic CHF (congestive heart failure) (HCC)   Pneumonia due to COVID-19 virus  Stroke MRI of brain showed multifocal changes of acute/early subacute infarction within the right PCA territory involving the right occipital and temporal lobes.  Small  amount of associated petechial hemorrhage within the right occipital lobe.  Acute/early subacute infarcts are also present within the right thalamus.   Neurology, Dr. Doy Mince is consulted. Plan: Echo with bubble, Ef 55%, no WMA, negative bubble Permissive hypertension for 48 hours. continue home ASA (pt was given 324 mg of ASA already) fasting lipid panel: no HLD noted  HbA1c 9.2, poor control of DM RN swallow screen, passed, diet advanced UDS: negative PT and OT consults: rec SNF.  Unfortunately after discussion with case management appears patient is uninsured and thus SNF may not be a possibility.  We attempted to refer to CIR however due to CIR's policies patient need to be 20 days out from a positive Covid test which would mean the earliest the patient can dispo to CIR would be Jul 19, 2019.  After reevaluation by physical therapy and Occupational Therapy they feel that discharge home is not a safe option.  I discussed this over the phone with the wife who stated that she was not informed of any of this.  I told her that I had been in close contact with the patient's son Kent [name] and the case management had also been talking to them.  Unclear to me why wife was not aware of the need this information.  I have asked case management to be in contact with the wife and explained that options are greatly limited in this case.  Case management will continue to work on a safe discharge plan for patient however we may be  in a situation where we are forced to discharge this patient home with home health services.  Hypertension and Hypertensive urgency Hypertensive urgency resolved Permissive hypertension allowed for 48 hours Continue home losartan Continue as needed IV hydralazine  Hyperlipidemia: - Lipitor 40 mg daily  Hypokalemia:  --replete PRN  Acute respiratory failure with hypoxia due to peumonia due to COVID-19 virus  initially pt had oxygen desaturation to 86% on room air.   Chest  x-ray showed right upper lobe patchy infiltration. Completed remdesivir, received Solumedrol 40 mg bid for many days. - vitamin C, zinc.  - Bronchodilators - PRN Mucinex for cough  Diabetes mellitus without complication (HCC)  Most recent A1c 9.2, poorly controled.  Patient is taking Metformin at home - Lantus 8U qHS -SSI - Hold home metformin  Chronic diastolic CHF (congestive heart failure) ([place]) 2D echo on 01/09/2016 showed EF of 45-50%.   No leg edema or JVD.   CHF seems to be compensated.  History of SCC head and neck History of GIST Per patients son he has been lost to onc followup since 2017 CT angio neck demonstrates soft tissue mass concerning for tumor recurrence Patients last visit to oncology at [facility] 2017 Oncology reviewed CAT scan Concerning for recurrence Patient will need outpatient PET scan  DVT prophylaxis: lovenox Code Status: Partial Family Communication:  Disposition Plan: Status is: Inpatient  Remains inpatient appropriate because:Unsafe d/c plan   Dispo: The patient is from: Home              Anticipated d/c is to: SNF              Anticipated d/c date is: uncertain              Patient currently is medically stable to d/c.   See above assessment and plan.  From medical standpoint patient can discharge however there is lack of a safe discharge plan here.  Unfortunately the patient is uninsured this will likely not be accepted to a skilled nursing facility bed.  CIR is unable to take him due to Covid positive status.  Physical therapy and Occupational Therapy are both stated that home health is likely not a safe dispo plan.  We are limited in options.  Case management looking for safe discharge plan.   Consultants:   Neurology  NSG  Oncology   Procedures:   2D echo with bubble  Antimicrobials:   Remdesivir    Subjective: Pt complained of being cold today, and again not sleeping well.  Denied pain.  No fever, dyspnea, N/V/D,  dysuria.   Objective: Vitals:   07/02/19 2004 07/03/19 0011 07/03/19 0802 07/03/19 1622  BP: (!) 115/93 (!) 145/90 108/74 (!) 189/99  Pulse: 90 79 79 78  Resp: 16 18 16 18   Temp: 98.2 F (36.8 C) 98.6 F (37 C) 98.4 F (36.9 C) 98.3 F (36.8 C)  TempSrc: Oral Oral Axillary Oral  SpO2: 97% 94% 97% (!) 89%  Weight:      Height:        Intake/Output Summary (Last 24 hours) at 07/03/2019 1627 Last data filed at 07/03/2019 1315 Gross per 24 hour  Intake 240 ml  Output 275 ml  Net -35 ml   Filed Weights   06/22/19 0523  Weight: 81.6 kg    Examination:  Constitutional: NAD, sleepy, arousable HEENT: conjunctivae and lids normal, EOMI CV: RRR no M,R,G. Distal pulses +2.  No cyanosis.   RESP: clear, normal respiratory effort  GI: +BS,  NTND Extremities: No effusions, edema, or tenderness in BLE SKIN: warm, dry and intact Neuro: II - XII grossly intact.     Data Reviewed: I have personally reviewed following labs and imaging studies  CBC: Recent Labs  Lab 06/27/19 0625 07/02/19 0554  WBC 15.8* 10.7*  NEUTROABS 13.8*  --   HGB 15.5 15.0  HCT 43.7 43.0  MCV 81.8 83.7  PLT 397 505   Basic Metabolic Panel: Recent Labs  Lab 06/27/19 0625 07/02/19 0554  NA 135 134*  K 3.5 3.3*  CL 96* 98  CO2 29 27  GLUCOSE 176* 108*  BUN 32* 20  CREATININE 1.03 1.02  CALCIUM 8.5* 8.0*  MG  --  2.3   GFR: Estimated Creatinine Clearance: 74.8 mL/min (by C-G formula based on SCr of 1.02 mg/dL). Liver Function Tests: Recent Labs  Lab 06/27/19 0625  AST 27  ALT 32  ALKPHOS 77  BILITOT 1.0  PROT 7.1  ALBUMIN 4.0   No results for input(s): LIPASE, AMYLASE in the last 168 hours. No results for input(s): AMMONIA in the last 168 hours. Coagulation Profile: No results for input(s): INR, PROTIME in the last 168 hours. Cardiac Enzymes: No results for input(s): CKTOTAL, CKMB, CKMBINDEX, TROPONINI in the last 168 hours. BNP (last 3 results) No results for input(s): PROBNP in  the last 8760 hours. HbA1C: No results for input(s): HGBA1C in the last 72 hours. CBG: Recent Labs  Lab 07/01/19 1135 07/01/19 1729 07/02/19 1652 07/02/19 2140 07/03/19 1155  GLUCAP 119* 182* 136* 122* 153*   Lipid Profile: No results for input(s): CHOL, HDL, LDLCALC, TRIG, CHOLHDL, LDLDIRECT in the last 72 hours. Thyroid Function Tests: No results for input(s): TSH, T4TOTAL, FREET4, T3FREE, THYROIDAB in the last 72 hours. Anemia Panel: No results for input(s): VITAMINB12, FOLATE, FERRITIN, TIBC, IRON, RETICCTPCT in the last 72 hours. Sepsis Labs: No results for input(s): PROCALCITON, LATICACIDVEN in the last 168 hours.  No results found for this or any previous visit (from the past 240 hour(s)).       Radiology Studies: No results found.      Scheduled Meds: . aspirin EC  81 mg Oral Daily  . atorvastatin  40 mg Oral Daily  . bisacodyl  10 mg Rectal Once  . enoxaparin (LOVENOX) injection  40 mg Subcutaneous Q24H  . insulin aspart  0-9 Units Subcutaneous TID WC  . insulin detemir  8 Units Subcutaneous QHS  . ipratropium  2 puff Inhalation Q4H  . losartan  50 mg Oral Daily  . oxymetazoline  1 spray Each Nare BID  . polyethylene glycol  17 g Oral BID   Continuous Infusions:    LOS: 11 days     Enzo Bi, MD [facility] Pager [phone]  If 7PM-7AM, please contact night-coverage 07/03/2019, 4:27 PM

## 2019-07-04 LAB — GLUCOSE, CAPILLARY
Glucose-Capillary: 103 mg/dL — ABNORMAL HIGH (ref 70–99)
Glucose-Capillary: 114 mg/dL — ABNORMAL HIGH (ref 70–99)
Glucose-Capillary: 139 mg/dL — ABNORMAL HIGH (ref 70–99)
Glucose-Capillary: 110 mg/dL — ABNORMAL HIGH (ref 70–99)
Glucose-Capillary: 94 mg/dL (ref 70–99)
Glucose-Capillary: 111 mg/dL — ABNORMAL HIGH (ref 70–99)
Glucose-Capillary: 145 mg/dL — ABNORMAL HIGH (ref 70–99)
Glucose-Capillary: 127 mg/dL — ABNORMAL HIGH (ref 70–99)

## 2019-07-04 NOTE — TOC Progression Note (Signed)
Transition of Care [facility]) - Progression Note    Patient Details  Name: Kent [name] MRN: [id number] Date of Birth: [birth year]  Transition [facility]) CM/SW Contact  Shelbie Hutching, RN Phone Number: 07/04/2019, 4:37 PM  Clinical Narrative:    Levada Dy with [facility] called and reports that they are good with the patient admitting tomorrow if we get the Letter of Guarantee to them.  Letter of Guarantee sent to [facility] administration.    Expected Discharge Plan: [place] Barriers to Discharge: Inadequate or no insurance, SNF Pending payor source - LOG  Expected Discharge Plan and Services Expected Discharge Plan: [place] In-house Referral: Clinical Social Work   Post Acute Care Choice: [place] arrangements for the past 2 months: Single Family Home                                       Social Determinants of Health (SDOH) Interventions    Readmission Risk Interventions No flowsheet data found.

## 2019-07-04 NOTE — Progress Notes (Signed)
PROGRESS NOTE    Kent [name]  DDU:[id number] DOB: Jul 07, 1955 DOA: 06/22/2019 PCP: System, Pcp Not In    Brief Narrative: HPI: Kent [name] is a 63 y.o. 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  Pt had positive Covid19 test 4 days ago. He denies cough or shortness breath, but per RN in ED, pt has mild dry cough. No fever or chills. He states that he has been feeling weak and fatigue since then. He states tha0t he was asleep and when he woke up he feel weaker in left arm. He also states that he is having intermittent weakness in his legs bilaterally. He has dizziness when he stands up and walks. Per report, pt had possible slurry speech.  No vision loss or hearing loss. Pt reports incontinence of bladder and bowels.  No nausea, vomiting, or abdominal pain.  His son states that he had mild loose stool bowel movement at home.  Patient had oxygen desaturation to 86% on room air initially, currently 96% on room air.  Blood pressure 201/141, which improved to 123/93. Cardene gttt is ordered, but nopt started per RN in ED. HR 39 -108, RR21.  Chest x-ray showed right upper lobe patchy infiltration.  Patient is admitted to [facility] bed as inpatient.   Assessment & Plan:   Principal Problem:   Stroke [facility]) Active Problems:   Hypertension   Hyperlipidemia   Hypokalemia   Hypertensive urgency   Acute respiratory failure with hypoxia (HCC)   Diabetes mellitus without complication (HCC)   Chronic diastolic CHF (congestive heart failure) (HCC)   Pneumonia due to COVID-19 virus  Stroke MRI of brain showed multifocal changes of acute/early subacute infarction within the right PCA territory involving the right occipital and temporal lobes.  Small  amount of associated petechial hemorrhage within the right occipital lobe.  Acute/early subacute infarcts are also present within the right thalamus.   Neurology, Dr. Doy Mince is consulted. Plan: Echo with bubble, Ef 55%, no WMA, negative bubble Permissive hypertension for 48 hours. continue home ASA (pt was given 324 mg of ASA already) fasting lipid panel: no HLD noted  HbA1c 9.2, poor control of DM RN swallow screen, passed, diet advanced UDS: negative PT and OT consults: rec SNF.  Unfortunately after discussion with case management appears patient is uninsured and thus SNF may not be a possibility.  We attempted to refer to CIR however due to CIR's policies patient need to be 20 days out from a positive Covid test which would mean the earliest the patient can dispo to CIR would be Jul 19, 2019.  After reevaluation by physical therapy and Occupational Therapy they feel that discharge home is not a safe option.  I discussed this over the phone with the wife who stated that she was not informed of any of this.  I told her that I had been in close contact with the patient's son [name] and the case management had also been talking to them.  Unclear to me why wife was not aware of the need this information.  I have asked case management to be in contact with the wife and explained that options are greatly limited in this case.  Case management will continue to work on a safe discharge plan for patient however we may be  in a situation where we are forced to discharge this patient home with home health services.  Hypertension and Hypertensive urgency Hypertensive urgency resolved Permissive hypertension allowed for 48 hours Continue home losartan Continue as needed IV hydralazine  Hyperlipidemia: - Lipitor 40 mg daily  Hypokalemia:  --replete PRN  Acute respiratory failure with hypoxia due to peumonia due to COVID-19 virus  initially pt had oxygen desaturation to 86% on room air.   Chest  x-ray showed right upper lobe patchy infiltration. Completed remdesivir, received Solumedrol 40 mg bid for many days. - vitamin C, zinc.  - Bronchodilators - PRN Mucinex for cough  Diabetes mellitus without complication (HCC)  Most recent A1c 9.2, poorly controled.  Patient is taking Metformin at home - Lantus 8U qHS -SSI - Hold home metformin  Chronic diastolic CHF (congestive heart failure) ([place]) 2D echo on 01/09/2016 showed EF of 45-50%.   No leg edema or JVD.   CHF seems to be compensated.  History of SCC head and neck History of GIST Per patients son he has been lost to onc followup since 2017 CT angio neck demonstrates soft tissue mass concerning for tumor recurrence Patients last visit to oncology at [facility] 2017 Oncology reviewed CAT scan Concerning for recurrence Patient will need outpatient PET scan  DVT prophylaxis: lovenox Code Status: Partial Family Communication:  Disposition Plan: Status is: Inpatient  Remains inpatient appropriate because:Unsafe d/c plan   Dispo: The patient is from: Home              Anticipated d/c is to: SNF              Anticipated d/c date is: uncertain              Patient currently is medically stable to d/c.   See above assessment and plan.  From medical standpoint patient can discharge however there is lack of a safe discharge plan here.  Unfortunately the patient is uninsured this will likely not be accepted to a skilled nursing facility bed.  [facility] is unable to take him due to Covid positive status.  Physical therapy and Occupational Therapy are both stated that home health is likely not a safe dispo plan.  We are limited in options.  Case management looking for safe discharge plan.   Consultants:   Neurology  NSG  Oncology   Procedures:   2D echo with bubble  Antimicrobials:   Remdesivir    Subjective: Pt continued to complain of not being comfortable in his bed.  No fever, dyspnea, chest pain, abdominal pain,  N/V/D, dysuria.    Objective: Vitals:   07/03/19 1622 07/04/19 0016 07/04/19 0757 07/04/19 1441  BP: (!) 189/99 99/72 98/76  (!) 86/60  Pulse: 78 74 84 88  Resp: 18 17 18 19   Temp: 98.3 F (36.8 C) 98 F (36.7 C) 97.8 F (36.6 C) 98.2 F (36.8 C)  TempSrc: Oral Oral  Oral  SpO2: (!) 89% 97% 90% 97%  Weight:      Height:        Intake/Output Summary (Last 24 hours) at 07/04/2019 1601 Last data filed at 07/04/2019 0915 Gross per 24 hour  Intake 480 ml  Output 350 ml  Net 130 ml   Filed Weights   06/22/19 0523  Weight: 81.6 kg    Examination:  Constitutional: NAD, sleepy, arousable HEENT: conjunctivae and lids normal, EOMI CV: RRR no M,R,G. Distal pulses +2.  No cyanosis.   RESP: clear, normal respiratory effort  GI: +  BS, NTND Extremities: No effusions, edema, or tenderness in BLE SKIN: warm, dry and intact Neuro: II - XII grossly intact.     Data Reviewed: I have personally reviewed following labs and imaging studies  CBC: Recent Labs  Lab 07/02/19 0554  WBC 10.7*  HGB 15.0  HCT 43.0  MCV 83.7  PLT 081   Basic Metabolic Panel: Recent Labs  Lab 07/02/19 0554  NA 134*  K 3.3*  CL 98  CO2 27  GLUCOSE 108*  BUN 20  CREATININE 1.02  CALCIUM 8.0*  MG 2.3   GFR: Estimated Creatinine Clearance: 74.8 mL/min (by C-G formula based on SCr of 1.02 mg/dL). Liver Function Tests: No results for input(s): AST, ALT, ALKPHOS, BILITOT, PROT, ALBUMIN in the last 168 hours. No results for input(s): LIPASE, AMYLASE in the last 168 hours. No results for input(s): AMMONIA in the last 168 hours. Coagulation Profile: No results for input(s): INR, PROTIME in the last 168 hours. Cardiac Enzymes: No results for input(s): CKTOTAL, CKMB, CKMBINDEX, TROPONINI in the last 168 hours. BNP (last 3 results) No results for input(s): PROBNP in the last 8760 hours. HbA1C: No results for input(s): HGBA1C in the last 72 hours. CBG: Recent Labs  Lab 07/03/19 1155 07/03/19 1618  07/03/19 2050 07/04/19 0752 07/04/19 1127  GLUCAP 153* 111* 117* 94 111*   Lipid Profile: No results for input(s): CHOL, HDL, LDLCALC, TRIG, CHOLHDL, LDLDIRECT in the last 72 hours. Thyroid Function Tests: No results for input(s): TSH, T4TOTAL, FREET4, T3FREE, THYROIDAB in the last 72 hours. Anemia Panel: No results for input(s): VITAMINB12, FOLATE, FERRITIN, TIBC, IRON, RETICCTPCT in the last 72 hours. Sepsis Labs: No results for input(s): PROCALCITON, LATICACIDVEN in the last 168 hours.  No results found for this or any previous visit (from the past 240 hour(s)).       Radiology Studies: No results found.      Scheduled Meds: . aspirin EC  81 mg Oral Daily  . atorvastatin  40 mg Oral Daily  . bisacodyl  10 mg Rectal Once  . enoxaparin (LOVENOX) injection  40 mg Subcutaneous Q24H  . insulin aspart  0-9 Units Subcutaneous TID WC  . insulin detemir  8 Units Subcutaneous QHS  . ipratropium  2 puff Inhalation Q4H  . losartan  50 mg Oral Daily  . oxymetazoline  1 spray Each Nare BID  . polyethylene glycol  17 g Oral BID   Continuous Infusions:    LOS: 12 days     Enzo Bi, MD [facility] Pager [phone]  If 7PM-7AM, please contact night-coverage 07/04/2019, 4:01 PM

## 2019-07-04 NOTE — TOC Progression Note (Signed)
Transition of Care [facility]) - Progression Note    Patient Details  Name: KEANU LESNIAK MRN: [id number] Date of Birth: [birth year]  Transition of Care [facility]) CM/SW Contact  Shelbie Hutching, RN Phone Number: 07/04/2019, 4:27 PM  Clinical Narrative:    This RNCM called and left a message with Levada Dy at [facility] to call back for update.   Levada Dy has not called back.    Expected Discharge Plan: [place] Barriers to Discharge: Inadequate or no insurance, SNF Pending payor source - LOG  Expected Discharge Plan and Services Expected Discharge Plan: [place] In-house Referral: Clinical Social Work   Post Acute Care Choice: [employer] arrangements for the past 2 months: Single Family Home                                       Social Determinants of Health (SDOH) Interventions    Readmission Risk Interventions No flowsheet data found.

## 2019-07-04 NOTE — Progress Notes (Signed)
Physical Therapy Treatment Patient Details Name: Kent [name] MRN: [id number] DOB: 06-22-1955 Today's Date: 07/04/2019    History of Present Illness Kent [name] is an 64 y.o. 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  Patient left the ED, but later returned via [facility] with left extremity weakness. MRI of the brain showed acute/subacute infarcts in the right occipital, right temporal and right thalamic regions.  Some associated petechial hemorrhage noted.  Follow up CTA shows multiple areas of stenosis/occlusion including left vertebral origin moderate to severe, right PCA occlusion, left PCA severe, proximal right M2 moderate to severe.  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    PT Comments    Finishing lunch upon arrival.  Continues to have difficulty with self feeing with a large portion of food in the bed and on the floor.  Participated in exercises as described below.  Pt with more controlled movements with LLE today initially but quality decreases as fatigues.  He is able to stand x 5 at end of bed with end of bed for support.  Pt with poor awareness of foot placement and will try to stand with ankle rolled out.  Cues and assist to place for safety and prevent injury.  As pt fatigues he has more difficulty with following directions.  He does voice feeling frustrated with lack of mobility/progress.   Follow Up Recommendations  CIR;SNF;Supervision/Assistance - 24 hour;Supervision for mobility/OOB     Equipment Recommendations  Rolling walker with 5" wheels;3in1 (PT);Wheelchair (measurements PT);Wheelchair cushion (measurements PT)    Recommendations for Other Services        Precautions / Restrictions Precautions Precautions: Fall Precaution Comments: High fall Restrictions Weight Bearing Restrictions: No    Mobility  Bed Mobility Overal bed mobility: Needs Assistance Bed Mobility: Rolling;Supine to Sit;Sit to Supine Rolling: Min guard   Supine to sit: Min guard Sit to supine: Min guard      Transfers   Equipment used: 1 person hand held assist Transfers: Sit to/from Stand Sit to Stand: Min assist         General transfer comment: sit to stand x 5 with rail at end of bed for support.  poor awareness of foot position when trying to stand needing cues and assist for foot placement.  Ambulation/Gait             General Gait Details: uanble to sidestep along bed.  L knee buckles   Stairs             Wheelchair Mobility    Modified Rankin (Stroke Patients Only)       Balance Overall balance assessment: Needs assistance Sitting-balance support: Feet supported;Single extremity supported Sitting balance-Leahy Scale: Fair Sitting balance - Comments: pt continues to present with fair sitting balance with RUE support and feet supported on floor. Impulsivity make him high fall risk in sitting.   Standing balance support: Single extremity supported Standing balance-Leahy Scale: Poor Standing balance comment: pt is high fall risk with poor balance noted. Less severe L lateral lean however poor standing balance with single support  Cognition Arousal/Alertness: Awake/alert Behavior During Therapy: WFL for tasks assessed/performed Overall Cognitive Status: Impaired/Different from baseline                                        Exercises Other Exercises Other Exercises: BLE ex SLR, heel slides, ab/add with manual resistance 3 x 10,  bridges 4 x 10    General Comments        Pertinent Vitals/Pain Pain Assessment: No/denies pain    Home Living                       Prior Function            PT Goals (current goals can now be found in the care plan section) Progress towards PT goals: Progressing toward goals    Frequency    7X/week      PT Plan Current plan remains appropriate    Co-evaluation              AM-PAC PT "6 Clicks" Mobility   Outcome Measure  Help needed turning from your back to your side while in a flat bed without using bedrails?: A Little Help needed moving from lying on your back to sitting on the side of a flat bed without using bedrails?: A Little Help needed moving to and from a bed to a chair (including a wheelchair)?: A Lot Help needed standing up from a chair using your arms (e.g., wheelchair or bedside chair)?: A Lot Help needed to walk in hospital room?: Total Help needed climbing 3-5 steps with a railing? : Total 6 Click Score: 12    End of Session Equipment Utilized During Treatment: Gait belt Activity Tolerance: Patient limited by fatigue Patient left: in bed;with call bell/phone within reach;with bed alarm set Nurse Communication: Mobility status;Other (comment) Hemiplegia - Right/Left: Left Hemiplegia - dominant/non-dominant: Dominant Hemiplegia - caused by: Cerebral infarction     Time: 1330-1400 PT Time Calculation (min) (ACUTE ONLY): 30 min  Charges:  $Therapeutic Exercise: 8-22 mins $Therapeutic Activity: 8-22 mins                    Chesley Noon, PTA 07/04/19, 2:52 PM

## 2019-07-04 NOTE — Plan of Care (Signed)
  Problem: Self-Care: Goal: Verbalization of feelings and concerns over difficulty with self-care will improve Outcome: Progressing Goal: Ability to communicate needs accurately will improve Outcome: Progressing   Problem: Education: Goal: Knowledge of disease or condition will improve Outcome: Progressing Goal: Knowledge of secondary prevention will improve Outcome: Progressing Goal: Knowledge of patient specific risk factors addressed and post discharge goals established will improve Outcome: Progressing

## 2019-07-04 NOTE — Plan of Care (Signed)
Continue with current plan of care. Encourage participation in self care with assistance.

## 2019-07-05 LAB — GLUCOSE, CAPILLARY
Glucose-Capillary: 91 mg/dL (ref 70–99)
Glucose-Capillary: 162 mg/dL — ABNORMAL HIGH (ref 70–99)

## 2019-07-05 MED ORDER — INSULIN DETEMIR 100 UNIT/ML ~~LOC~~ SOLN: 8.0000 [IU] | Freq: Every day | SUBCUTANEOUS | 11 refills | Status: DC

## 2019-07-05 MED ORDER — ASPIRIN 81 MG PO TBEC: 81.0000 mg | DELAYED_RELEASE_TABLET | Freq: Every day | ORAL | Status: DC

## 2019-07-05 MED ORDER — POLYETHYLENE GLYCOL 3350 17 G PO PACK: 17.0000 g | PACK | Freq: Two times a day (BID) | ORAL | 0 refills | Status: DC

## 2019-07-05 MED ORDER — ATORVASTATIN CALCIUM 40 MG PO TABS: 40.0000 mg | ORAL_TABLET | Freq: Every day | ORAL | Status: DC

## 2019-07-05 NOTE — Discharge Summary (Signed)
Physician Discharge Summary   Kent [name]  male DOB: 03-Dec-1955  XTG:[id number]  PCP: System, Pcp Not In  Admit date: 06/22/2019 Discharge date: 07/05/2019  Admitted From: home Disposition:  SNF CODE STATUS: Limited code.  No intubation.   Hospital Course:  For full details, please see H&P, progress notes, consult notes and ancillary notes.  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  Pt had positive Covid19 test 4 days PTA. Hestated that he had been feeling weak and fatigue since then. Pt woke up feeling weaker in left arm and intermittent weakness in his legs bilaterally.  He haddizzinesswhen hestood up.Per report, pt had possibleslurred speech.   Stroke 2 small, incidental aneurysms MRI of the brain showed acute/subacute infarcts in the right occipital, right temporal and right thalamic regions.  Some associated petechial hemorrhage noted.  Follow up CTA showed multiple areas of stenosis/occlusion including left vertebral origin moderate to severe, right PCA occlusion, left PCA severe, proximal right M2 moderate to severe.  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   Echo with bubble, EF 55%, no WMA, negative bubble.  A1c 9.2 and LDL 47.  Neurology consulted and recommended continuing ASA 81 daily.  Continued home statin.    Neurosurgery was consulted on the small incidental aneurysms, and recommended no intervention, and will follow up with pt as outpatient.  HypertensionandHypertensive urgency, resolved Permissive hypertension allowed for 48 hours, then home losartan continued.  Hyperlipidemia Lipitor 40 mg daily  Hypokalemia replete PRN  Acute respiratory failure  with hypoxia due COVID-19 pneumonia, resolved Initially pt hadoxygen desaturation to 86% on room air. Chest x-ray showed right upper lobe patchy infiltration.  Pt completed remdesivir, and a long steroid course with solumedrol.  Pt has been on RA since at least 06/29/19.  Diabetes mellitus without complication ([place]) Most recent A1c9.2, poorly controled.  Pt has been receiving Levemir 8u daily with good control in BG.  Chronic diastolic CHF (congestive heart failure) ([place]) 2D echo on 01/09/2016 showed EF of 45-50%, which appeared improved to EF 55% on 06/23/19.  No leg edema or JVD.  CHF seemsto be compensated.  History of SCC head and neck History of GIST Per patient's son, pt has been lost to onc followup since 2017.  CT angio neck demonstrated soft tissue mass concerning for tumor recurrence.  Patient's last visit to oncology at [facility] 2017.  Patient will need outpatient PET scan and outpatient onc followup.   Discharge Diagnoses:  Principal Problem:   Stroke [facility]) Active Problems:   Hypertension   Hyperlipidemia   Hypokalemia   Hypertensive urgency   Acute respiratory failure with hypoxia (HCC)   Diabetes mellitus without complication (HCC)   Chronic diastolic CHF (congestive heart failure) ([place])   Pneumonia due to COVID-19 virus    Discharge Instructions:  Allergies as of 07/05/2019   No Known Allergies     Medication List    STOP taking these medications   metFORMIN 500 MG 24 hr tablet Commonly known as: GLUCOPHAGE-XR     TAKE these medications   aspirin 81 MG EC tablet Take 1 tablet (81 mg total) by mouth daily. Start taking on: Jul 06, 2019   atorvastatin 40 MG tablet Commonly known as: LIPITOR Take 1 tablet (40 mg total) by mouth daily.  Start taking on: Jul 06, 2019   insulin detemir 100 UNIT/ML injection Commonly known as: LEVEMIR Inject 0.08 mLs (8 Units total) into the skin daily. Notes to patient: Last dose 07/04/19 at 2107   losartan 50 MG  tablet Commonly known as: COZAAR Take 50 mg by mouth daily.   polyethylene glycol 17 g packet Commonly known as: MIRALAX / GLYCOLAX Take 17 g by mouth 2 (two) times daily.            Durable Medical Equipment  (From admission, onward)         Start     Ordered   06/28/19 0739  For home use only DME 3 n 1  Once     06/28/19 0739   06/28/19 0739  For home use only DME standard manual wheelchair with seat cushion  Once    Comments: Patient suffers from acute CVA which impairs their ability to perform daily activities like bathing in the home.  A walker will not resolve issue with performing activities of daily living. A wheelchair will allow patient to safely perform daily activities. Patient can safely propel the wheelchair in the home or has a caregiver who can provide assistance. Length of need 12 months . Accessories: elevating leg rests (ELRs), wheel locks, extensions and anti-tippers.   06/28/19 0739   06/28/19 0738  For home use only DME Walker rolling  Once    Question Answer Comment  Walker: With [place]   Patient needs a walker to treat with the following condition Weakness      06/28/19 0739           Contact information for follow-up providers    [facility].        Margaretha Sheffield, MD. Schedule an appointment as soon as possible for a visit in 1 week(s).   Specialty: Otolaryngology Why: History of SCC head and neck.  CT angio neck demonstrated soft tissue mass concerning for tumor recurrence.  Contact information: [address] [place] [address] [phone]        Meade Maw, MD. Schedule an appointment as soon as possible for a visit in 1 month(s).   Specialty: Neurosurgery Why: folowup on small, incidental aneurysms Contact information: [place] [address] [phone]        Anabel Bene, MD. Schedule an appointment as soon as possible for a visit in 1 month(s).    Specialty: Neurology Why: follow up for strokes Contact information: [address] [facility] [place] [address] [phone]            Contact information for after-discharge care    [facility] .   Service: Skilled Nursing Contact information: [place] [phone]                  No Known Allergies   The results of significant diagnostics from this hospitalization (including imaging, microbiology, ancillary and laboratory) are listed below for reference.   Consultations:   Procedures/Studies: CT ANGIO HEAD W OR WO CONTRAST  Addendum Date: 06/22/2019   ADDENDUM REPORT: 06/22/2019 10:54 ADDENDUM: These results were called by telephone at the time of interpretation on 06/22/2019 at 10:54 am to provider LESLIE REYNOLDS , who verbally acknowledged these results. Electronically Signed   By: Kellie Simmering DO   On: 06/22/2019 10:54   Result Date: 06/22/2019 CLINICAL DATA:  Stroke, follow-up. EXAM: CT ANGIOGRAPHY HEAD AND NECK TECHNIQUE: Multidetector CT imaging of the head and neck was performed using the standard protocol during bolus administration of intravenous contrast. Multiplanar CT image reconstructions and MIPs were obtained to evaluate the vascular anatomy. Carotid stenosis measurements (when applicable) are obtained utilizing NASCET criteria, using the distal internal carotid diameter as the denominator. CONTRAST:  41mL OMNIPAQUE IOHEXOL 350 MG/ML SOLN COMPARISON:  Brain MRI performed earlier the same day 06/22/2019, chest radiograph 06/22/2019, PET-CT 05/29/2016, neck CT 08/17/2015 FINDINGS: CT HEAD FINDINGS Brain: Acute/early subacute infarction changes within the right occipital lobe have not significantly changed in extent. Additional foci of acute/early subacute infarction within the medial and posterior right temporal lobe and right thalamus, some of which was  better appreciated on same-day brain MRI. Subtle petechial hemorrhage within the right occipital lobe was also better appreciated on this prior exam. No interval infarct is identified. Redemonstrated chronic cortically based infarct within the posterior left frontal lobe. Stable background moderate chronic small vessel ischemic disease and mild generalized parenchymal atrophy. There is no evidence of intracranial mass. No midline shift or extra-axial fluid collection Vascular: Reported below. Skull: Normal. Negative for fracture or focal lesion. Sinuses: Mild ethmoid and maxillary sinus mucosal thickening. No significant mastoid effusion. Orbits: No acute abnormality. Review of the MIP images confirms the above findings CTA NECK FINDINGS Aortic arch: Common origin of the innominate and left common carotid arteries. The visualized aortic arch is unremarkable. No significant innominate or proximal subclavian artery stenosis. Right carotid system: CCA and ICA patent within the neck without significant stenosis. Mild mixed plaque within the carotid bifurcation Left carotid system: CCA and ICA patent within the neck without significant stenosis. Mild soft plaque within the CCA. Prominent tortuosity of the proximal to mid ICA. Vertebral arteries: The vertebral arteries are patent within the neck bilaterally. Soft plaque results in moderate/severe stenosis at the origin of the left vertebral artery. Skeleton: No acute bony abnormality or aggressive osseous lesion. Other neck: Ill ill-defined soft tissue prominence with irregular enhancement and regions of internal hypodensity within the left neck, closely related to the left sternocleidomastoid muscle. This region is incompletely assessed on this arterial phase scan, but measures approximately 3.9 x 1.9 x 5.4 cm (AP x TV x CC) (for instance as seen on series 4, image 160 and series 5, image 161). Findings are highly suspicious for a residual/recurrent tumor deposit or  nodal conglomerate. Surrounding soft tissue infiltration is nonspecific but may reflect treatment related change. Upper chest: There are multifocal ill-defined and nodular opacities within the imaged lung apices which are likely infectious in etiology. Septic emboli cannot be excluded. Review of the MIP images confirms the above findings CTA HEAD FINDINGS Anterior circulation: The intracranial right internal carotid artery is patent without significant stenosis. The intracranial left internal carotid artery is patent. Mild mixed plaque within the paraclinoid segment with mild stenosis at this site. The M1 middle cerebral arteries are patent without significant stenosis. No M2 proximal branch occlusion is identified. Atherosclerotic irregularity of the M2 and more distal MCA branch vessels bilaterally. Most notably, there are foci of moderate/severe stenosis within a proximal M2 right MCA branch vessel (series 6, image 17). Hypoplastic A1 left ACA. The anterior cerebral arteries are patent without high-grade proximal stenosis. There is a bulbous appearance of the A-comm complex measuring 3.5 cm (series 4, image 246) (series 5, image 80). Posterior circulation: The intracranial vertebral arteries are patent bilaterally. Moderate/severe focal atherosclerotic narrowing of the V4 segment  on the right. Immediately adjacent to this stenosis, there is a 1-2 mm rounded focus of hyperdensity along the V4 segment (series 5, images 130 and 131). This may reflect a tiny aneurysm or eccentric focus of calcified plaque. The basilar artery is patent without significant stenosis. Suspected fetal origin right posterior cerebral artery. The right posterior communicating artery becomes occluded at its distal aspect (series 7, images 97 and 98). No definite distal reconstitution of the right posterior cerebral artery is identified. There is a fetal origin left posterior cerebral artery, which is patent. There is a high-grade focal  stenosis within the left posterior communicating artery (series 7, image 121). Venous sinuses: Within limitations of contrast timing, no convincing thrombus. Anatomic variants: As described Review of the MIP images confirms the above findings IMPRESSION: CT head: 1. Acute/early subacute infarction changes within the right occipital and temporal lobes, as well as right thalamus, have not significantly changed in extent as compared to examinations performed earlier the same day. 2. No interval intracranial abnormality is identified. 3. Stable background moderate chronic small vessel ischemic disease and mild generalized parenchymal atrophy. CTA neck: 1. The bilateral common and internal carotid arteries are patent within the neck without significant stenosis. 2. The vertebral arteries are codominant and patent within the neck bilaterally. Moderate/severe atherosclerotic narrowing at the origin of the left vertebral artery. 3. Multifocal ill-defined and nodular opacities within the periphery of the imaged lung apices which are likely infectious in etiology. Septic emboli cannot be excluded. 4. Ill-defined soft tissue with irregular enhancement and regions of internal hypodensity within the left neck measuring 3.9 x 1.9 x 5.4 cm. Findings are incompletely assessed on this arterial phase scan, but suspicious for a residual/recurrent tumor deposit or nodal conglomerate. Non-emergent contrast-enhanced neck CT is recommended for further evaluation. CTA head: 1. Probable fetal origin right posterior cerebral artery. The right posterior communicating artery becomes occluded at its distal aspect. No definite reconstitution of the right posterior cerebral artery is identified more distally. 2. Fetal origin left posterior cerebral artery with high-grade focal stenosis within the left posterior communicating artery. 3. Focal high-grade stenosis within the V4 right vertebral artery. 4. Atherosclerotic irregularity of the M2 and  more distal MCA branch vessels bilaterally. Most notably there are multifocal moderate/severe stenoses within a proximal M2 right MCA branch. 5. Bulbous anterior communicating artery complex measuring up to 3.5 mm suspicious for aneurysm. Catheter-based angiography is recommended for further evaluation, as clinically warranted. 6. 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 This too could be further assessed at time of catheter based angiography. Electronically Signed: By: Kellie Simmering DO On: 06/22/2019 10:46   CT Head Wo Contrast  Result Date: 06/22/2019 CLINICAL DATA:  Left extremity weakness. Left-sided numbness. EXAM: CT HEAD WITHOUT CONTRAST TECHNIQUE: Contiguous axial images were obtained from the base of the skull through the vertex without intravenous contrast. COMPARISON:  None. FINDINGS: Brain: Focal hypoattenuation and loss of gray-white differentiation is noted in the right occipital lobe. No hemorrhage or mass lesion is present. Moderate diffuse white matter disease is present bilaterally. The right insular cortex and basal ganglia are intact. Remote left posterior frontal lobe infarct is present. The ventricles are of normal size. No significant extraaxial fluid collection is present. Vascular: Atherosclerotic calcifications are present within the cavernous internal carotid arteries bilaterally as well as in the right vertebral artery. No hyperdense vessel is present. Skull: Calvarium is intact. No focal lytic or blastic lesions are present.  Sinuses/Orbits: Mild mucosal thickening present in maxillary sinuses and ethmoid air cells bilaterally. No fluid levels are present. The globes and orbits are within normal limits. IMPRESSION: 1. Focal hypoattenuation and loss of gray-white differentiation in the right occipital lobe is concerning for acute/subacute nonhemorrhagic infarct. 2. Remote left posterior frontal lobe infarct. 3. Moderate diffuse white matter  disease. This likely reflects the sequela of chronic microvascular ischemia. Acute white matter ischemia is not excluded. These results were called by telephone at the time of interpretation on 06/22/2019 at 5:47 am to provider [facility] , who verbally acknowledged these results. Electronically Signed   By: San Morelle M.D.   On: 06/22/2019 05:47   CT SOFT TISSUE NECK W CONTRAST  Result Date: 06/24/2019 CLINICAL DATA:  Left neck mass on recent CTA. 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 EXAM: CT NECK WITH CONTRAST TECHNIQUE: Multidetector CT imaging of the neck was performed using the standard protocol following the bolus administration of intravenous contrast. CONTRAST:  105mL OMNIPAQUE IOHEXOL 300 MG/ML  SOLN COMPARISON:  Head and neck CTA 06/22/2019. PET-CT 05/29/2016. Neck CT 08/17/2015. FINDINGS: Pharynx and larynx: There is mild volume loss in the region of the left lateral tongue base without a locally recurrent mass. Widely patent airway with chronic asymmetric enlargement of the right laryngeal ventricle and right para form sinus. Salivary glands: Asymmetric left submandibular and left parotid atrophy. Punctate calculus in the right submandibular gland. Thyroid: Unremarkable. Lymph nodes: Soft tissue thickening and fat stranding throughout the left neck with obscuration of fascial planes consistent with prior radiation therapy. Heterogeneously enhancing masslike, ill-defined soft tissue in the left neck in levels II and III partially inseparable from the sternocleidomastoid and paraspinal musculature and extending over an area measuring approximately 3.6 x 3.3 x 7.0 cm (AP x transverse x craniocaudal), progressed from the 2018 PET-CT. Mild anterior displacement of the left common and internal carotid arteries with carotid artery encasement by low-density soft tissue. No evidence of contralateral lymphadenopathy. Vascular: Encasement and occlusion of the left internal  jugular vein by the left neck mass. Limited intracranial: Unremarkable. Visualized orbits: Not imaged. Mastoids and visualized paranasal sinuses: Mild bilateral maxillary sinus mucosal thickening. Clear mastoid air cells. Skeleton: No suspicious osseous lesion. Mild cervical spondylosis. Upper chest: Patchy peripheral ground-glass opacities in both lung apices. Other: None. IMPRESSION: 1. 7 cm ill-defined left neck mass, increased from 2018 and consistent with recurrent/progressive tumor. 2. Left internal jugular vein occlusion. 3. Patchy ground-glass opacities in both lung apices consistent with known COVID-19 pneumonia. Electronically Signed   By: Logan Bores M.D.   On: 06/24/2019 15:10   CT ANGIO NECK W OR WO CONTRAST  Addendum Date: 06/22/2019   ADDENDUM REPORT: 06/22/2019 10:54 ADDENDUM: These results were called by telephone at the time of interpretation on 06/22/2019 at 10:54 am to provider LESLIE REYNOLDS , who verbally acknowledged these results. Electronically Signed   By: Kellie Simmering DO   On: 06/22/2019 10:54   Result Date: 06/22/2019 CLINICAL DATA:  Stroke, follow-up. EXAM: CT ANGIOGRAPHY HEAD AND NECK TECHNIQUE: Multidetector CT imaging of the head and neck was performed using the standard protocol during bolus administration of intravenous contrast. Multiplanar CT image reconstructions and MIPs were obtained to evaluate the vascular anatomy. Carotid stenosis measurements (when applicable) are obtained utilizing NASCET criteria, using the distal internal carotid diameter as the denominator. CONTRAST:  72mL OMNIPAQUE IOHEXOL 350 MG/ML SOLN COMPARISON:  Brain MRI performed earlier the same day 06/22/2019, chest radiograph 06/22/2019,  PET-CT 05/29/2016, neck CT 08/17/2015 FINDINGS: CT HEAD FINDINGS Brain: Acute/early subacute infarction changes within the right occipital lobe have not significantly changed in extent. Additional foci of acute/early subacute infarction within the medial and posterior  right temporal lobe and right thalamus, some of which was better appreciated on same-day brain MRI. Subtle petechial hemorrhage within the right occipital lobe was also better appreciated on this prior exam. No interval infarct is identified. Redemonstrated chronic cortically based infarct within the posterior left frontal lobe. Stable background moderate chronic small vessel ischemic disease and mild generalized parenchymal atrophy. There is no evidence of intracranial mass. No midline shift or extra-axial fluid collection Vascular: Reported below. Skull: Normal. Negative for fracture or focal lesion. Sinuses: Mild ethmoid and maxillary sinus mucosal thickening. No significant mastoid effusion. Orbits: No acute abnormality. Review of the MIP images confirms the above findings CTA NECK FINDINGS Aortic arch: Common origin of the innominate and left common carotid arteries. The visualized aortic arch is unremarkable. No significant innominate or proximal subclavian artery stenosis. Right carotid system: CCA and ICA patent within the neck without significant stenosis. Mild mixed plaque within the carotid bifurcation Left carotid system: CCA and ICA patent within the neck without significant stenosis. Mild soft plaque within the CCA. Prominent tortuosity of the proximal to mid ICA. Vertebral arteries: The vertebral arteries are patent within the neck bilaterally. Soft plaque results in moderate/severe stenosis at the origin of the left vertebral artery. Skeleton: No acute bony abnormality or aggressive osseous lesion. Other neck: Ill ill-defined soft tissue prominence with irregular enhancement and regions of internal hypodensity within the left neck, closely related to the left sternocleidomastoid muscle. This region is incompletely assessed on this arterial phase scan, but measures approximately 3.9 x 1.9 x 5.4 cm (AP x TV x CC) (for instance as seen on series 4, image 160 and series 5, image 161). Findings are highly  suspicious for a residual/recurrent tumor deposit or nodal conglomerate. Surrounding soft tissue infiltration is nonspecific but may reflect treatment related change. Upper chest: There are multifocal ill-defined and nodular opacities within the imaged lung apices which are likely infectious in etiology. Septic emboli cannot be excluded. Review of the MIP images confirms the above findings CTA HEAD FINDINGS Anterior circulation: The intracranial right internal carotid artery is patent without significant stenosis. The intracranial left internal carotid artery is patent. Mild mixed plaque within the paraclinoid segment with mild stenosis at this site. The M1 middle cerebral arteries are patent without significant stenosis. No M2 proximal branch occlusion is identified. Atherosclerotic irregularity of the M2 and more distal MCA branch vessels bilaterally. Most notably, there are foci of moderate/severe stenosis within a proximal M2 right MCA branch vessel (series 6, image 17). Hypoplastic A1 left ACA. The anterior cerebral arteries are patent without high-grade proximal stenosis. There is a bulbous appearance of the A-comm complex measuring 3.5 cm (series 4, image 246) (series 5, image 80). Posterior circulation: The intracranial vertebral arteries are patent bilaterally. Moderate/severe focal atherosclerotic narrowing of the V4 segment on the right. Immediately adjacent to this stenosis, there is a 1-2 mm rounded focus of hyperdensity along the V4 segment (series 5, images 130 and 131). This may reflect a tiny aneurysm or eccentric focus of calcified plaque. The basilar artery is patent without significant stenosis. Suspected fetal origin right posterior cerebral artery. The right posterior communicating artery becomes occluded at its distal aspect (series 7, images 97 and 98). No definite distal reconstitution of the right posterior cerebral artery is  identified. There is a fetal origin left posterior cerebral  artery, which is patent. There is a high-grade focal stenosis within the left posterior communicating artery (series 7, image 121). Venous sinuses: Within limitations of contrast timing, no convincing thrombus. Anatomic variants: As described Review of the MIP images confirms the above findings IMPRESSION: CT head: 1. Acute/early subacute infarction changes within the right occipital and temporal lobes, as well as right thalamus, have not significantly changed in extent as compared to examinations performed earlier the same day. 2. No interval intracranial abnormality is identified. 3. Stable background moderate chronic small vessel ischemic disease and mild generalized parenchymal atrophy. CTA neck: 1. The bilateral common and internal carotid arteries are patent within the neck without significant stenosis. 2. The vertebral arteries are codominant and patent within the neck bilaterally. Moderate/severe atherosclerotic narrowing at the origin of the left vertebral artery. 3. Multifocal ill-defined and nodular opacities within the periphery of the imaged lung apices which are likely infectious in etiology. Septic emboli cannot be excluded. 4. Ill-defined soft tissue with irregular enhancement and regions of internal hypodensity within the left neck measuring 3.9 x 1.9 x 5.4 cm. Findings are incompletely assessed on this arterial phase scan, but suspicious for a residual/recurrent tumor deposit or nodal conglomerate. Non-emergent contrast-enhanced neck CT is recommended for further evaluation. CTA head: 1. Probable fetal origin right posterior cerebral artery. The right posterior communicating artery becomes occluded at its distal aspect. No definite reconstitution of the right posterior cerebral artery is identified more distally. 2. Fetal origin left posterior cerebral artery with high-grade focal stenosis within the left posterior communicating artery. 3. Focal high-grade stenosis within the V4 right vertebral  artery. 4. Atherosclerotic irregularity of the M2 and more distal MCA branch vessels bilaterally. Most notably there are multifocal moderate/severe stenoses within a proximal M2 right MCA branch. 5. Bulbous anterior communicating artery complex measuring up to 3.5 mm suspicious for aneurysm. Catheter-based angiography is recommended for further evaluation, as clinically warranted. 6. 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 This too could be further assessed at time of catheter based angiography. Electronically Signed: By: Kellie Simmering DO On: 06/22/2019 10:46   MR BRAIN WO CONTRAST  Result Date: 06/22/2019 CLINICAL DATA:  Focal neuro deficit, greater than 6 hours, stroke suspected. Additional history provided: Patient presents to emergency department from home with dizziness and weakness for the past 3-4 days. EXAM: MRI HEAD WITHOUT CONTRAST TECHNIQUE: Multiplanar, multiecho pulse sequences of the brain and surrounding structures were obtained without intravenous contrast. COMPARISON:  Noncontrast head CT performed earlier the same day 06/22/2019. FINDINGS: Brain: The examination is intermittently motion degraded. Most notably there is severe motion degradation of the sagittal T1 weighted sequence, moderate motion degradation of the axial T2 weighted sequence and moderate motion degradation of the axial T2/FLAIR sequence. There is patchy cortical/subcortical restricted diffusion within the right PCA territory involving the right occipital lobe and to a lesser degree the medial and posterior right temporal lobe. Findings are consistent with acute/early subacute infarction. The largest region of infarction within the right occipital lobe measures 3.6 cm. Additional acute/early subacute infarcts within the right thalamus. Corresponding T2/FLAIR hyperintensity at these sites. Additionally, there is a small amount of gyriform T1 hyperintensity within the right occipital  lobe consistent likely reflecting petechial hemorrhage. Redemonstrated small remote cortically based infarct within the posterior left frontal lobe. Moderate patchy T2/FLAIR hyperintensity within the cerebral white matter is nonspecific, but consistent with chronic small vessel  ischemic disease. Mild generalized parenchymal atrophy. There is no evidence of intracranial mass. No midline shift or extra-axial fluid collection. No chronic intracranial blood products. Vascular: No definite loss of expected flow voids within the proximal large arterial vessels. Skull and upper cervical spine: No focal marrow lesion is identified within the limitations motion degraded imaging. Sinuses/Orbits: Visualized orbits demonstrate no acute abnormality. Paranasal sinus mucosal thickening greatest within the bilateral ethmoid air cells. No significant mastoid effusion IMPRESSION: 1. Motion degraded examination as described. 2. Multifocal changes of acute/early subacute infarction within the right PCA territory involving the right occipital and temporal lobes. Small amount of associated petechial hemorrhage within the right occipital lobe. Acute/early subacute infarcts are also present within the right thalamus. 3. 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 4. Moderate chronic small vessel ischemic disease. 5. Mild generalized parenchymal atrophy. 6. Paranasal sinus mucosal thickening. Electronically Signed   By: Kellie Simmering DO   On: 06/22/2019 08:09   DG Chest Portable 1 View  Result Date: 06/22/2019 CLINICAL DATA:  Hypoxia. COVID positive. EXAM: PORTABLE CHEST 1 VIEW COMPARISON:  One-view chest x-ray 06/19/2019 FINDINGS: Heart size is normal. Patchy peripheral airspace disease is present in the right upper lobe. No significant consolidation is present. No effusions are present. Bony thorax is within normal limits. IMPRESSION: Patchy peripheral airspace disease in the right upper lobe  compatible with pneumonia. Electronically Signed   By: San Morelle M.D.   On: 06/22/2019 06:14   ECHOCARDIOGRAM COMPLETE BUBBLE STUDY  Result Date: 06/23/2019    ECHOCARDIOGRAM REPORT   Patient Name:   Kent [name] Date of Exam: 06/23/2019 Medical Rec #:  [id number]     Height:       67.0 in Accession #:    [id number]    Weight:       180.0 lb Date of Birth:  [birth year]     BSA:          1.934 m Patient Age:    23 years      BP:           135/72 mmHg Patient Gender: M             HR:           81 bpm. Exam Location:  [facility] Procedure: 2D Echo, Cardiac Doppler, Color Doppler and Saline Contrast Bubble            Study Indications:     Stroke 434.91  History:         Patient has no prior history of Echocardiogram examinations.                  Stroke; Risk Factors:Hypertension and Diabetes.  Sonographer:     Sherrie Sport RDCS (AE) Referring Phys:  [place] Diagnosing Phys: Ida Rogue MD  Sonographer Comments: Technically difficult study due to poor echo windows. 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 IMPRESSIONS  1. Left ventricular ejection fraction, by estimation, is 55%. The left ventricle has normal function. The left ventricle has no regional wall motion abnormalities. There is mild left ventricular hypertrophy. Left ventricular diastolic parameters are consistent with Grade I diastolic dysfunction (impaired relaxation).  2. Right ventricular systolic function is normal. The right ventricular size is normal. Tricuspid regurgitation signal is inadequate for assessing PA pressure.  3. Agitated saline contrast bubble study was negative, with no evidence of any interatrial shunt. FINDINGS  Left Ventricle: Left ventricular ejection fraction, by estimation, is 55 to 60%. The left ventricle has normal function. The left ventricle has no regional wall motion abnormalities. The left ventricular internal cavity size was  normal in size. There is  mild left ventricular hypertrophy. Left ventricular diastolic parameters are consistent with Grade I diastolic dysfunction (impaired relaxation). Right Ventricle: The right ventricular size is normal. No increase in right ventricular wall thickness. Right ventricular systolic function is normal. Tricuspid regurgitation signal is inadequate for assessing PA pressure. Left Atrium: Left atrial size was normal in size. Right Atrium: Right atrial size was normal in size. Pericardium: There is no evidence of pericardial effusion. Mitral Valve: The mitral valve is normal in structure. Normal mobility of the mitral valve leaflets. No evidence of mitral valve regurgitation. No evidence of mitral valve stenosis. Tricuspid Valve: The tricuspid valve is not well visualized. Tricuspid valve regurgitation is not demonstrated. No evidence of tricuspid stenosis. Aortic Valve: The aortic valve was not well visualized. Aortic valve regurgitation is not visualized. No aortic stenosis is present. Aortic valve mean gradient measures 2.5 mmHg. Aortic valve peak gradient measures 4.4 mmHg. Aortic valve area, by VTI measures 3.36 cm. Pulmonic Valve: The pulmonic valve was not well visualized. Pulmonic valve regurgitation is not visualized. No evidence of pulmonic stenosis. Aorta: The aortic root is normal in size and structure. Venous: The inferior vena cava is normal in size with greater than 50% respiratory variability, suggesting right atrial pressure of 3 mmHg. IAS/Shunts: No atrial level shunt detected by color flow Doppler. Agitated saline contrast was given intravenously to evaluate for intracardiac shunting. Agitated saline contrast bubble study was negative, with no evidence of any interatrial shunt. There  is no evidence of a patent foramen ovale. There is no evidence of an atrial septal defect.  LEFT VENTRICLE PLAX 2D LVIDd:         5.15 cm LVIDs:         2.91 cm LV PW:         1.40 cm LV IVS:         1.32 cm LVOT diam:     2.20 cm LV SV:         55 LV SV Index:   29 LVOT Area:     3.80 cm  LEFT ATRIUM         Index LA diam:    3.40 cm 1.76 cm/m  AORTIC VALVE                   PULMONIC VALVE AV Area (Vmax):    2.20 cm    PV Vmax:        0.77 m/s AV Area (Vmean):   2.72 cm    PV Peak grad:   2.3 mmHg AV Area (VTI):     3.36 cm    RVOT Peak grad: 5 mmHg AV Vmax:           104.55 cm/s AV Vmean:          68.350 cm/s AV VTI:            0.165 m AV Peak Grad:      4.4 mmHg AV Mean Grad:      2.5 mmHg LVOT Vmax:         60.60 cm/s LVOT Vmean:        48.900 cm/s LVOT VTI:          0.146 m LVOT/AV VTI ratio: 0.88  AORTA Ao Root  diam: 3.60 cm MITRAL VALVE MV Area (PHT): 3.20 cm    SHUNTS MV Decel Time: 237 msec    Systemic VTI:  0.15 m MV E velocity: 57.70 cm/s  Systemic Diam: 2.20 cm MV A velocity: 96.00 cm/s MV E/A ratio:  0.60 Ida Rogue MD Electronically signed by Ida Rogue MD Signature Date/Time: 06/23/2019/2:20:03 PM    Final       Labs: BNP (last 3 results) Recent Labs    06/22/19 0722  BNP 379.0*   Basic Metabolic Panel: Recent Labs  Lab 07/02/19 0554  NA 134*  K 3.3*  CL 98  CO2 27  GLUCOSE 108*  BUN 20  CREATININE 1.02  CALCIUM 8.0*  MG 2.3   Liver Function Tests: No results for input(s): AST, ALT, ALKPHOS, BILITOT, PROT, ALBUMIN in the last 168 hours. No results for input(s): LIPASE, AMYLASE in the last 168 hours. No results for input(s): AMMONIA in the last 168 hours. CBC: Recent Labs  Lab 07/02/19 0554  WBC 10.7*  HGB 15.0  HCT 43.0  MCV 83.7  PLT 230   Cardiac Enzymes: No results for input(s): CKTOTAL, CKMB, CKMBINDEX, TROPONINI in the last 168 hours. BNP: Invalid input(s): POCBNP CBG: Recent Labs  Lab 07/04/19 1127 07/04/19 1700 07/04/19 2105 07/05/19 0751 07/05/19 1138  GLUCAP 111* 145* 127* 91 162*   D-Dimer No results for input(s): DDIMER in the last 72 hours. Hgb A1c No results for input(s): HGBA1C in the last 72 hours. Lipid  Profile No results for input(s): CHOL, HDL, LDLCALC, TRIG, CHOLHDL, LDLDIRECT in the last 72 hours. Thyroid function studies No results for input(s): TSH, T4TOTAL, T3FREE, THYROIDAB in the last 72 hours.  Invalid input(s): FREET3 Anemia work up No results for input(s): VITAMINB12, FOLATE, FERRITIN, TIBC, IRON, RETICCTPCT in the last 72 hours. Urinalysis    Component Value Date/Time   COLORURINE YELLOW (A) 06/21/2019 2203   APPEARANCEUR CLEAR (A) 06/21/2019 2203   APPEARANCEUR Clear 02/14/2013 1441   LABSPEC 1.006 06/21/2019 2203   LABSPEC 1.020 02/14/2013 1441   PHURINE 7.0 06/21/2019 2203   GLUCOSEU NEGATIVE 06/21/2019 2203   GLUCOSEU 300 mg/dL 02/14/2013 1441   HGBUR NEGATIVE 06/21/2019 2203   BILIRUBINUR NEGATIVE 06/21/2019 2203   BILIRUBINUR Negative 02/14/2013 1441   KETONESUR NEGATIVE 06/21/2019 2203   PROTEINUR 100 (A) 06/21/2019 2203   NITRITE NEGATIVE 06/21/2019 2203   LEUKOCYTESUR NEGATIVE 06/21/2019 2203   LEUKOCYTESUR Negative 02/14/2013 1441   Sepsis Labs Invalid input(s): PROCALCITONIN,  WBC,  LACTICIDVEN Microbiology No results found for this or any previous visit (from the past 240 hour(s)).   Total time spend on discharging this patient, including the last patient exam, discussing the hospital stay, instructions for ongoing care as it relates to all pertinent caregivers, as well as preparing the medical discharge records, prescriptions, and/or referrals as applicable, is 45 minutes.    Enzo Bi, MD  [facility] 07/05/2019, 2:09 PM  If 7PM-7AM, please contact night-coverage

## 2019-07-05 NOTE — Plan of Care (Signed)
All care plans resolved. Patient transferred to skilled nursing facility.

## 2019-07-05 NOTE — Progress Notes (Signed)
Physical Therapy Treatment Patient Details Name: Kent [name] MRN: [id number] DOB: 1955/10/16 Today's Date: 07/05/2019    History of Present Illness Kent [name] is an 64 y.o. 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  Patient left the ED, but later returned via [facility] with left extremity weakness. MRI of the brain showed acute/subacute infarcts in the right occipital, right temporal and right thalamic regions.  Some associated petechial hemorrhage noted.  Follow up CTA shows multiple areas of stenosis/occlusion including left vertebral origin moderate to severe, right PCA occlusion, left PCA severe, proximal right M2 moderate to severe.  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    PT Comments    Pt was long sitting in bed eating his breakfast upon arriving. He agrees to PT session and OOB activity. Pt was able to roll L to short sit with increased time and max vcs+ tactile cues. Overall to achieve EOB sitting only required min assist. Sat EOB for several minutes prior to transfer training. Session focused on standing safety and stand pivot technique. He tolerated well but does continue to have safety concerns and lack of insight on his deficits. Pt was left in recliner at conclusion of session with call bell in reach, chair alarm in place, and RN aware of his safety concerns.OT to assist pt with returning to bed later this morning. Pt will benefit from rehab/SNF at [place] to address deficits with strength, coordination, and overall safe functional mobility.      Follow Up Recommendations  CIR;SNF;Supervision/Assistance - 24 hour;Supervision for mobility/OOB     Equipment Recommendations   Rolling walker with 5" wheels;3in1 (PT);Wheelchair (measurements PT);Wheelchair cushion (measurements PT)    Recommendations for Other Services       Precautions / Restrictions Precautions Precautions: Fall Precaution Comments: High fall Restrictions Weight Bearing Restrictions: No    Mobility  Bed Mobility Overal bed mobility: Needs Assistance Bed Mobility: Rolling;Supine to Sit;Sit to Supine Rolling: Min guard Sidelying to sit: Min assist       General bed mobility comments: Pt was able to roll L to short sit with increased tim and vcs for technique and sequencing. Pt continues to be slightly impulsive with visual deficits present. tactile and verbal cueing throughout session for safety.  Transfers Overall transfer level: Needs assistance Equipment used: 1 person hand held assist Transfers: Sit to/from Stand Sit to Stand: Min assist Stand pivot transfers: Min assist;Mod assist       General transfer comment: Pt was able to stand 4 x EOB prior to stand pivot to/from recliner 2 x. Pt does well with pivot to R but unsafe to trial pivot to L. Vcs and tactile cues for handplacement and safety throughout. LLE ataxia and uncoordinated movement still present  Ambulation/Gait             General Gait Details: unsafe to advance to gait   Stairs             Wheelchair Mobility    Modified Rankin (Stroke Patients Only)       Balance Overall balance assessment: Needs assistance Sitting-balance support: Feet supported;Single extremity supported Sitting balance-Leahy Scale: Fair Sitting balance - Comments: continues to rely in RUE support for maintaining balance  Standing balance support: Single extremity supported Standing balance-Leahy Scale: Poor Standing balance comment: Pt continues to be high fall risk 2/2 to balance and congition deficits. poor insight of deficits continues to limit pt and is safety concern.                             Cognition Arousal/Alertness: Awake/alert Behavior During Therapy: WFL for tasks assessed/performed Overall Cognitive Status: Impaired/Different from baseline Area of Impairment: Problem solving;Following commands;Safety/judgement;Awareness                       Following Commands: Follows one step commands with increased time Safety/Judgement: Decreased awareness of safety;Decreased awareness of deficits Awareness: Intellectual Problem Solving: Slow processing;Requires verbal cues;Requires tactile cues General Comments: Pt was much more alert this date and agreeable to PT session. Continues to be very motivated and wants to improve so he can return home.       Exercises      General Comments        Pertinent Vitals/Pain Pain Assessment: 0-10 Pain Score: 3  Faces Pain Scale: Hurts a little bit Pain Location: R ear Pain Descriptors / Indicators: Constant Pain Intervention(s): Limited activity within patient's tolerance;Monitored during session    Home Living                      Prior Function            PT Goals (current goals can now be found in the care plan section) Acute Rehab PT Goals Patient Stated Goal: " I want to get out of here so I can get home" Progress towards PT goals: Progressing toward goals    Frequency    7X/week      PT Plan Current plan remains appropriate    Co-evaluation     PT goals addressed during session: Mobility/safety with mobility;Strengthening/ROM;Balance        AM-PAC PT "6 Clicks" Mobility   Outcome Measure  Help needed turning from your back to your side while in a flat bed without using bedrails?: A Little Help needed moving from lying on your back to sitting on the side of a flat bed without using bedrails?: A Little Help needed moving to and from a bed to a chair (including a wheelchair)?: A Lot Help needed standing up from a chair using your arms (e.g., wheelchair or bedside chair)?: A Lot Help  needed to walk in hospital room?: Total Help needed climbing 3-5 steps with a railing? : Total 6 Click Score: 12    End of Session         PT Visit Diagnosis: Unsteadiness on feet (R26.81);Difficulty in walking, not elsewhere classified (R26.2);Hemiplegia and hemiparesis Hemiplegia - Right/Left: Left Hemiplegia - dominant/non-dominant: Dominant Hemiplegia - caused by: Cerebral infarction     Time: 0920-0950 PT Time Calculation (min) (ACUTE ONLY): 30 min  Charges:  $Therapeutic Activity: 8-22 mins $Neuromuscular Re-education: 8-22 mins                     Julaine Fusi PTA 07/05/19, 11:44 AM

## 2019-07-05 NOTE — Progress Notes (Signed)
Occupational Therapy Treatment Patient Details Name: Kent [name] MRN: [id number] DOB: 22-Oct-1955 Today's Date: 07/05/2019    History of present illness Kent [name] is an 64 y.o. 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  Patient left the ED, but later returned via [facility] with left extremity weakness. MRI of the brain showed acute/subacute infarcts in the right occipital, right temporal and right thalamic regions.  Some associated petechial hemorrhage noted.  Follow up CTA shows multiple areas of stenosis/occlusion including left vertebral origin moderate to severe, right PCA occlusion, left PCA severe, proximal right M2 moderate to severe.  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   OT comments  Kent [name] was seen for OT treatment on this date. Upon arrival to room pt reclined in chair reporting fatigue. OT assisted pt to bed c MOD A for SPT c multimodal cues for safety. Pt performed RUE THEREX at bed level c no cues needed for AAROM shoulder/elbow flexion. MIN VCs for technique/initiation to complete 1 set x 10 reps each of shoulder over head press and finger flexion/extension. Pt verbalized understanding of instruction provided. Pt making good progress toward goals. Pt continues to benefit from skilled OT services to maximize return to PLOF and minimize risk of future falls, injury, and readmission. Will continue to follow POC. Discharge recommendation remains appropriate.    Follow Up Recommendations  CIR;SNF;Supervision/Assistance - 24 hour    Equipment Recommendations  3 in 1 bedside commode    Recommendations for Other Services      Precautions / Restrictions  Precautions Precautions: Fall Precaution Comments: High fall Restrictions Weight Bearing Restrictions: No       Mobility Bed Mobility Overal bed mobility: Needs Assistance Bed Mobility: Sit to Supine Rolling: Min guard Sidelying to sit: Min assist   Sit to supine: Supervision   General bed mobility comments: Pt was able to roll L to short sit with increased tim and vcs for technique and sequencing. Pt continues to be slightly impulsive with visual deficits present. tactile and verbal cueing throughout session for safety.  Transfers Overall transfer level: Needs assistance Equipment used: 1 person hand held assist Transfers: Sit to/from [facility] Sit to Stand: Mod assist Stand pivot transfers: Mod assist       General transfer comment: Pt reports fatigue and required MOD A chair>bed SPT c MAX cues for safety    Balance Overall balance assessment: Needs assistance Sitting-balance support: Feet supported;Single extremity supported Sitting balance-Leahy Scale: Fair Sitting balance - Comments: continues to rely in RUE support for maintaining balance    Standing balance support: Bilateral upper extremity supported Standing balance-Leahy Scale: Poor Standing balance comment: Poor awareness of safety and balalnce deficits                           ADL either performed or assessed with clinical judgement   ADL Overall ADL's : Needs assistance/impaired                                       General ADL Comments: MOD A adjust socks at bed level  Vision       Perception     Praxis      Cognition Arousal/Alertness: Awake/alert Behavior During Therapy: WFL for tasks assessed/performed Overall Cognitive Status: Impaired/Different from baseline Area of Impairment: Problem solving;Following commands;Safety/judgement;Awareness                       Following Commands: Follows one step commands with increased  time Safety/Judgement: Decreased awareness of safety;Decreased awareness of deficits Awareness: Intellectual Problem Solving: Slow processing;Requires verbal cues;Requires tactile cues General Comments: Pt was much more alert this date and agreeable to PT session. Continues to be very motivated and wants to improve so he can return home.         Exercises Exercises: Other exercises Other Exercises Other Exercises: Pt educated re: HEP, falls prevention Other Exercises: SPT, sit>sup, RUE THEREX: AAROM shoulder/elbow flexion, over head press, fist pumps   Shoulder Instructions       General Comments      Pertinent Vitals/ Pain       Pain Assessment: No/denies pain Pain Score: 3  Faces Pain Scale: Hurts a little bit Pain Location: R ear Pain Descriptors / Indicators: Constant Pain Intervention(s): Limited activity within patient's tolerance;Monitored during session  Home Living                                          Prior Functioning/Environment              Frequency  Min 3X/week        Progress Toward Goals  OT Goals(current goals can now be found in the care plan section)  Progress towards OT goals: Progressing toward goals  Acute Rehab OT Goals Patient Stated Goal: " I want to get out of here so I can get home" OT Goal Formulation: With patient Time For Goal Achievement: 07/06/19 Potential to Achieve Goals: Fair ADL Goals Pt Will Perform Grooming: sitting;with adaptive equipment;with supervision;with set-up(c LRAD PRN for safety and improved fxl independence) Pt Will Transfer to Toilet: ambulating;with min assist;bedside commode(c LRAD PRN for safety and improved fxl independence) Pt Will Perform Toileting - Clothing Manipulation and hygiene: sit to/from stand;with min guard assist(c LRAD PRN for safety and improved fxl independence)  Plan Discharge plan remains appropriate;Frequency remains appropriate    Co-evaluation        PT  goals addressed during session: Mobility/safety with mobility;Strengthening/ROM;Balance        AM-PAC OT "6 Clicks" Daily Activity     Outcome Measure   Help from another person eating meals?: A Little Help from another person taking care of personal grooming?: A Little Help from another person toileting, which includes using toliet, bedpan, or urinal?: A Lot Help from another person bathing (including washing, rinsing, drying)?: A Lot Help from another person to put on and taking off regular upper body clothing?: A Little Help from another person to put on and taking off regular lower body clothing?: A Lot 6 Click Score: 15    End of Session    OT Visit Diagnosis: Other abnormalities of gait and mobility (R26.89);Hemiplegia and hemiparesis Hemiplegia - Right/Left: Left Hemiplegia - dominant/non-dominant: Dominant Hemiplegia - caused by: Cerebral infarction   Activity Tolerance Patient tolerated treatment well   Patient Left in bed;with call bell/phone within reach;with bed alarm set   Nurse Communication Mobility status(Pt back in bed, bed alarm on )  Time: 2419-9144 OT Time Calculation (min): 13 min  Charges: OT General Charges $OT Visit: 1 Visit OT Treatments $Self Care/Home Management : 8-22 mins  Dessie Coma, M.S. OTR/L  07/05/19, 1:23 PM

## 2019-07-05 NOTE — TOC Progression Note (Signed)
Transition of Care [facility]) - Progression Note    Patient Details  Name: DAOUD LOBUE MRN: [id number] Date of Birth: Mar 22, 1955  [facility] of [facility]) CM/SW Contact  Shelbie Ammons, RN Phone Number: 07/05/2019, 10:15 AM  Clinical Narrative:   RNCM placed call to [facility] with [facility] to follow up on possible transfer today, left VM for return call.     Expected Discharge Plan: [place] Barriers to Discharge: Inadequate or no insurance, SNF Pending payor source - LOG  Expected Discharge Plan and Services Expected Discharge Plan: [place] In-house Referral: Clinical Social Work   Post Acute Care Choice: [place] arrangements for the past 2 months: Single Family Home                                       Social Determinants of Health (SDOH) Interventions    Readmission Risk Interventions No flowsheet data found.

## 2019-07-26 DIAGNOSIS — R221 Localized swelling, mass and lump, neck: Secondary | ICD-10-CM | POA: Insufficient documentation

## 2019-07-26 NOTE — Progress Notes (Signed)
[facility]  [address] [place], [place] [address] Phone: [phone]  Fax: [phone]   Clinic Day:  07/27/2019  Referring physician: No ref. provider found  Chief Complaint: Kent [name] is a 64 y.o. 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 Kathyrn Sheriff for a new neck mass.   HPI: The patient was last seen in the medical oncology clinic on 06/23/2016.  He had received concurrent cisplatin and radiation (completed 12/2015).  Symptomatically, oral pain had improved.  Exam was unremarkable.  Plan was for follow-up chest CT in 6 months.  He was lost to follow up.   Chest CT on 12/23/2016 revealed stable small pulmonary nodules. There was no evidence of pulmonary metastasis and no mediastinal adenopathy.   He was seen at [facility] on 06/19/2019 for dizziness and loss of appetite. He was diagnosed with COVID-19 on 06/03/2019.   Patient was admitted to [facility] on 06/22/2019 - 07/05/2019 for an ischemic cerebrovascular accident. He had 2 small incidental aneurysms. Head MRI on 06/22/2019 showed acute/subacute infarcts in the right occipital, right temporal and right thalamic regions with some associated petechial hemorrhage. CTA on 06/24/2019 showed multiple areas of stenosis/occlusion including left vertebral origin moderate to severe, right PCA occlusion, left PCA severe, proximal right M2 moderate to severe. Echo with bubble study was negative. He began aspirin 81 mg by mouth daily.   CT angiogram of the neck on 06/22/2019 revealed an ill-defined 3.9 x 1.9 x 5.4 cm soft tissue prominence with irregular enhancement and regions of internal hypodensity within the left neck, closely related to the left sternocleidomastoid muscle. This region was incompletely assessed on this arterial phase scan.  Findings were highly suspicious for a residual/recurrent tumor deposit or nodal  conglomerate. Surrounding soft tissue infiltration  was nonspecific but may reflect treatment related change.  Soft tissue neck CT with contrast on 06/24/2019 revealed a 7 cm ill-defined left neck mass, increased from 2018 and c/w recurrent/progressive tumor.  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  There was patchy ground-glass opacities in both lung apices c/w COVID-19 pneumonia.  Patient was seen by Dr. Kathyrn Sheriff on 07/19/2019 for complaints of a neck mass located on the left side of the neck. He had associated difficulty swallowing and hoarseness. The mass had been present for months. The mass developed gradually. The left neck was rock hard without a discrete mass on exam.  Flexible laryngoscopy revealed no discrete mass.  PET scan was planned.  Labs followed:  06/19/2019 ([facility]): Hematocrit 39.7, hemoglobin 13.7, MCV 85.2, platelets 219,000, WBC 5100, ANC 3700.  06/23/2019 ([facility]): Hematocrit 42.3, hemoglobin 14.9, MCV 83.1, platelets 347,000, WBC 9100, ANC 8100.  07/02/2019 ([facility]): Hematocrit 43.0, hemoglobin 15.0, MCV 83.7, platelets 230,000, WBC 10700.   Symptomatically, he feels "good'" today. Since his stroke on 06/22/2019, he has had left arm numbness and tingling and mild symptoms in his left leg. He notes decreased hearing in both ears. He feels fatigued most of the time. He is currently in rehabilitation at the [facility] in [place], [place]. He has been aware of the lump in his neck since his radiation. His wife says she hasn't noticed it get any bigger. The lump has appeared to be "rock hard" the entire time. He has had difficulty swallowing.  He feels "ok".  He feels the left side of his face on his cheek is "soft" and "mushy" s/p CVA. He  denies any headaches. He sometimes has to turn his head to see sometimes. He denies any changes in smell or taste. He admits to dry mouth and cough. He denies any abdominal and urinary symptoms. He denies any bone and joint pain, aside from  the numbness and tingling in his left leg and arm secondary to his stroke. He denies any skin changes. His wife says he has gained some weight. He has "slowly but surely" been making progress at the rehabilitation center. He ambulates with a walker.    Past Medical History:  Diagnosis Date  . Arthritis   . Diabetes mellitus without complication ([place])   . Hyperlipidemia   . Hypertension   . Obesity   . Osteomyelitis ([place])    From pt chart  . Renal insufficiency    CKD noted on chart  . Staph infection 05/29/2016   Nov 2017. Staph infection in LT shoulder requiring debridement and grafts  . Status post debridement   . 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 Tongue cancer ([place])    Stage IV squamous cell carcinoma of the base of the tongue/left tonsil    Past Surgical History:  Procedure Laterality Date  . EXCISION OF TONGUE LESION Left 06/19/2016   Procedure: EXCISION OF TONGUE LESION;  Surgeon: Margaretha Sheffield, MD;  Location: [facility];  Service: ENT;  Laterality: Left;  diabetic - insulin and oral meds - not using either currently  . gastric tumor removed  2010  . I & D EXTREMITY Left 01/09/2016   Procedure: IRRIGATION AND DEBRIDEMENT EXTREMITY;  Surgeon: Robert Bellow, MD;  Location: [facility];  Service: General;  Laterality: Left;  . LARYNGOSCOPY N/A 06/19/2016   Procedure: LARYNGOSCOPY;  Surgeon: Margaretha Sheffield, MD;  Location: [facility];  Service: ENT;  Laterality: N/A;  . PERIPHERAL VASCULAR CATHETERIZATION Right 12/31/2015   Procedure: Lower Extremity Angiography;  Surgeon: Algernon Huxley, MD;  Location: [facility];  Service: Cardiovascular;  Laterality: Right;  . tongue mass biopsy      Family History  Problem Relation Age of Onset  . Heart disease Mother   . Hypertension Father   . Cancer Sister        stomach  . Heart disease Brother        MI  . Cancer Daughter   . Cancer Son        testicular  . ADD / ADHD Sister     Social  History:  reports that he has never smoked. He has never used smokeless tobacco. He reports that he does not drink alcohol or use drugs. He smokes a pipe and cigar rarely (once every 3 months).  He has a son and daughter. 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 He currently lives at the [facility] in [place], [place]. The patient is accompanied by his wife [name] on the iPad today.  Allergies: No Known Allergies  Current Medications: Current Outpatient Medications  Medication Sig Dispense Refill  . AMLODIPINE BESYLATE PO Take 5 mg by mouth daily.    Marland Kitchen aspirin EC 81 MG EC tablet Take 1 tablet (81 mg total) by mouth daily.    Marland Kitchen atorvastatin (LIPITOR) 40 MG tablet Take 1 tablet (40 mg total) by mouth daily.    . insulin detemir (LEVEMIR) 100 UNIT/ML injection Inject 0.08 mLs (8 Units total) into the skin daily. 10 mL 11  . polyethylene glycol (MIRALAX /  GLYCOLAX) 17 g packet Take 17 g by mouth 2 (two) times daily. 14 each 0  . losartan (COZAAR) 50 MG tablet Take 50 mg by mouth daily.     No current facility-administered medications for this visit.    Review of Systems  Constitutional: Positive for malaise/fatigue (wants to sleep all the time). Negative for chills, diaphoresis, fever and weight loss.       Doing "ok".   HENT: Positive for hearing loss (secondary to stroke in 06/22/2019). Negative for congestion, sore throat and tinnitus.        '"Rock hard" lump on left side of neck. Pain and difficulty with swallowing. Dry mouth.   Eyes: Negative for blurred vision and double vision.  Respiratory: Positive for cough. Negative for shortness of breath.   Cardiovascular: Negative for chest pain, palpitations and leg swelling.  Gastrointestinal: Negative for abdominal pain, blood in stool, constipation, diarrhea, heartburn, melena, nausea and vomiting.       Difficulty swallowing due to lump on left side of neck.   Genitourinary: Negative for dysuria, flank pain,  frequency, hematuria and urgency.  Musculoskeletal: Negative for back pain, falls, joint pain, myalgias and neck pain.  Skin: Negative for itching and rash.  Neurological: Positive for tingling (left arm and leg), sensory change (numbness in left cheek, arm, and left leg s/p CVA) and weakness (left arm and leg s/p CVA). Negative for dizziness, tremors, speech change, focal weakness and headaches.       CVA on 06/22/2019.   Endo/Heme/Allergies: Does not bruise/bleed easily.       Diabetes.   Psychiatric/Behavioral: Negative for depression. The patient is not nervous/anxious.    Performance status (ECOG):  2  Vitals Blood pressure 121/82, pulse (!) 104, temperature (!) 96.5 F (35.8 C), temperature source Tympanic, resp. rate 16, weight 171 lb 10.1 oz (77.9 kg), SpO2 99 %.   Physical Exam  Constitutional: He is oriented to person, place, and time. He appears well-developed and well-nourished. No distress. Face mask in place.  Gentleman sitting comfortably in a wheelchair in no acute distress.  HENT:  Head: Normocephalic and atraumatic.  Right Ear: Hearing normal.  Left Ear: Hearing normal.  Mouth/Throat: Oropharynx is clear and moist and mucous membranes are normal. No oral lesions. No oropharyngeal exudate.  Short gray hair.   Poor dentition.  Goatee.   Eyes: Pupils are equal, round, and reactive to light. Conjunctivae and EOM are normal. Right eye exhibits no discharge. Left eye exhibits no discharge. No scleral icterus.   Blue eyes. Glasses.   Neck: No JVD present.  Left SCM rock hard and fixed (7.5 cm x 5 cm).  Cardiovascular: Normal rate, regular rhythm and normal heart sounds. Exam reveals no gallop and no friction rub.  No murmur heard. Pulmonary/Chest: Effort normal and breath sounds normal. He has no wheezes. He has no rhonchi. He has no rales.  Abdominal: Soft. Normal appearance and bowel sounds are normal. He exhibits no distension and no mass. There is no hepatosplenomegaly.  There is no abdominal tenderness. There is no rebound, no guarding and no CVA tenderness.  Musculoskeletal:        General: No tenderness, deformity or edema. Normal range of motion.     Cervical back: Normal range of motion and neck supple.  Lymphadenopathy:       Head (right side): No preauricular, no posterior auricular and no occipital adenopathy present.       Head (left side): No occipital adenopathy present.  He has no cervical adenopathy.    He has no axillary adenopathy.       Right: No inguinal and no supraclavicular adenopathy present.       Left: No inguinal and no supraclavicular adenopathy present.  Neurological: He is alert and oriented to person, place, and time.  Mild left upper extremity weakness.  Skin: Skin is warm, dry and intact. No bruising, no lesion and no rash noted. He is not diaphoretic. No erythema. No pallor.  Psychiatric: He has a normal mood and affect. His behavior is normal. Judgment and thought content normal.  Nursing note and vitals reviewed.   Admissions: [place] from 11/15/2015 - 11/16/2015 for hyponatremia (sodium 122).  He received IVF overnight.   [place] from 12/08/2015 - 12/09/2015 for hydration and electrolyte replacement. Sodium was 126.   [place] from 12/09/2015 - 12/12/2015 secondary to nausea, vomiting, and minimal oral intake.  He received IVF with electrolyte replacement and anti-emetics.  A PICC line was placed for IVF [place] from 12/28/2015 - 12/31/2015 with dehydration and hyponatremia.  He received IVF.  He was discovered to have dry gangrene of the right second toe.  Angiogram revealed good perfusion. [place] from 01/08/2016 - 01/10/2016 with necrotizing fasciitis of the left shoulder.  Blood cultures grew staph aureus.  He was treated with vancomycin, Zosyn, and clindamycin. He  underwent debridement of the left shoulder and wound VAC placement on 01/09/2016.  Intra-operative wound cultures on 11/20/207 revealed MSSA, pan sensitive.   [facility] from  01/10/2016 - 01/22/2016 for wound care.  He underwent a second debridement on 01/11/2016.  He underwent fasciocutaneous pectoral flap to the left shoulder with layered wound closure of the posterior shoulder and split thickness skin graft to the pectoralis major on 01/17/2016.  Bone biopsy revealed ragged necrotic bone with associated acute inflammation c/w acute osteomyelitis.  TEE revealed an AV vegetation versus Lambl's excrescence.  He was transitioned to linezolid due to vancomycin shortage.  He also received clindamycin and Zosyn.  He was treated with at least 4 weeks of antibiotics for presumed septic arthritis. [place] from 06/22/2019 - 07/05/2019 for an ischemic CVA.  Head MRI on 06/22/2019 showed acute/subacute infarcts in the right occipital, right temporal and right thalamic regions with some associated petechial hemorrhage. CTA on 06/24/2019 showed multiple areas of stenosis/occlusion including left vertebral origin moderate to severe, right PCA occlusion, left PCA severe, proximal right M2 moderate to severe.  Echo with bubble study was negative.   No visits with results within 3 Day(s) from this visit.  Latest known visit with results is:  No results displayed because visit has over 200 results.      Assessment:  Kent [name] is a 64 y.o. 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  Tumor was p16 IHC positive (high risk HPV).  He received 3 cycles of cisplatin (10/22/2015 - 12/04/2015) with concurrent radiation.  Radiation completed on 12/21/2015.  CT soft tissue neck on 08/17/2015 revealed a slowly progressive over years, infiltrative, deep 4.1 x 5.4 cm LEFT parotid mass with widespread ipsilateral bulky adenopathy.  [facility] on 09/17/2015 revealed left base of tongue malignancy corresponding to asymmetric enhancement identified on the neck CT with hypermetabolic extensive conglomerate left cervical adenopathy (level II-IV)  extending from the level of the angle of the mandible down inferiorly to the level of the cricoid cartilage.  There was clustered subcentimeter left level IB submandibular nodes with mild FDG activity.  There was no evidence of metastatic disease.  PET scan on 05/29/2016 revealed asymmetric hypermetabolism posterior left hilum.  There was mild left cervical lymphadenopathy with hypermetabolism, qualitatively decreased when comparing back to 09/17/2015 exam.  There were tiny bilateral pulmonary nodules, too small to detect on PET imaging.   Laryngoscopy and biopsy of the left tongue base and left retromalar trigone on 06/19/2016 revealed no evidence of malignancy.  Biopsies of the left tongue base noted candidiasis with reactive atypia. Retromolar triangle revealed squamous mucosa with candidiasis, ulceration and changes due to radiation therapy.  Soft tissue neck CT with contrast on 06/24/2019 revealed a 7 cm ill-defined left neck mass, increased from 2018 and c/w recurrent/progressive tumor.  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  There was patchy ground-glass opacities in both lung apices c/w COVID-19 pneumonia.  Alpha gal IgE was 1.21 (< 0.35) c/w IgE antibodies to galactose-alpha-1,3 galactose (oligosaccharide part on Fab portion of the cetuximab heavy chain) and suggestive of potential cetuximab reaction.  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  Pathologic stage was T4NxM0.  He took imatinib for 1 year.  He has a history of recurrent hyponatremia.   He was diagnosed with COVID-19 on 06/03/2019.   Symptomatically, he is slowly recovering from a left body CVA.  He has a 7 x 5 cm rock hard fixed mass in the left SCM area.  Plan: 1.   Labs during his hospitalization were reviewed. 2.   Clinical stage IVB (T2N3M0) base of tongue squamous cell carcinoma  He  received concurrent cisplatin and radiation (completed 12/2015).  Discuss CT soft tissue neck on 06/24/2019.  Images personally reviewed.  Agree with radiology interpretation.   Compared to prior imaging, mass has increased since last imaging worrisome for recurrent disease.  Direct laryngoscopy on 07/19/2019 revealed no masses.  Exam confirms a large fixed neck mass.  Discuss plan for PET scan to assess disease status and direct biopsy.   Suspect some areas of mass are necrotic and/or scar tissue.  Discuss treatment options if recurrent disease confirmed based on PD-L1 status.   Preliminary discussions regarding potential treatment reviewed.   Patient not a candidate for cetuximab based on alpha gal IgE status.   Multiple questions asked and answered. 3.   PET scan ASAP. 4.   Complete form for [facility]. 5.   RTC after PET scan for MD assessment and discussion regarding direction of therapy.  I discussed the assessment and treatment plan with the patient.  The patient was provided an opportunity to ask questions and all were answered.  The patient agreed with the plan and demonstrated an understanding of the instructions.  The patient was advised to call back if the symptoms worsen or if the condition fails to improve as anticipated.  I provided 26 minutes (11:45 AM - 12:11 PM) of face-to-face time during this this encounter and > 50% was spent counseling as documented under my assessment and plan. An additional 15-20 minutes were spent reviewing his chart (Epic and Care Everywhere) including notes, labs, and imaging studies. I personally spoke with Dr. Kathyrn Sheriff.   Lequita Asal, MD, PhD    07/27/2019, 11:45 AM  I, Heywood Footman, am acting as Education administrator for [employer]. Mike Gip, MD, PhD.  I, Melissa C. Mike Gip, MD, have reviewed the above documentation for accuracy and completeness, and I agree with the above.

## 2019-07-27 ENCOUNTER — Encounter: Payer: Self-pay | Admitting: Hematology and Oncology

## 2019-07-27 ENCOUNTER — Other Ambulatory Visit: Payer: Self-pay

## 2019-07-27 ENCOUNTER — Inpatient Hospital Stay: Payer: Self-pay | Attending: Hematology and Oncology | Admitting: Hematology and Oncology

## 2019-07-27 ENCOUNTER — Telehealth: Payer: Self-pay | Admitting: Hematology and Oncology

## 2019-07-27 VITALS — BP 121/82 | HR 104 | Temp 96.5°F | Resp 16 | Wt 171.6 lb

## 2019-07-27 DIAGNOSIS — R630 Anorexia: Secondary | ICD-10-CM | POA: Insufficient documentation

## 2019-07-27 DIAGNOSIS — R918 Other nonspecific abnormal finding of lung field: Secondary | ICD-10-CM | POA: Insufficient documentation

## 2019-07-27 DIAGNOSIS — C01 Malignant neoplasm of base of tongue: Secondary | ICD-10-CM | POA: Insufficient documentation

## 2019-07-27 DIAGNOSIS — Z8673 Personal history of transient ischemic attack (TIA), and cerebral infarction without residual deficits: Secondary | ICD-10-CM | POA: Insufficient documentation

## 2019-07-27 DIAGNOSIS — E669 Obesity, unspecified: Secondary | ICD-10-CM | POA: Insufficient documentation

## 2019-07-27 DIAGNOSIS — F17210 Nicotine dependence, cigarettes, uncomplicated: Secondary | ICD-10-CM | POA: Insufficient documentation

## 2019-07-27 DIAGNOSIS — Z800 Family history of malignant neoplasm of digestive organs: Secondary | ICD-10-CM | POA: Insufficient documentation

## 2019-07-27 DIAGNOSIS — Z79899 Other long term (current) drug therapy: Secondary | ICD-10-CM | POA: Insufficient documentation

## 2019-07-27 DIAGNOSIS — I10 Essential (primary) hypertension: Secondary | ICD-10-CM | POA: Insufficient documentation

## 2019-07-27 DIAGNOSIS — R682 Dry mouth, unspecified: Secondary | ICD-10-CM | POA: Insufficient documentation

## 2019-07-27 DIAGNOSIS — R42 Dizziness and giddiness: Secondary | ICD-10-CM | POA: Insufficient documentation

## 2019-07-27 DIAGNOSIS — R221 Localized swelling, mass and lump, neck: Secondary | ICD-10-CM | POA: Insufficient documentation

## 2019-07-27 DIAGNOSIS — Z85038 Personal history of other malignant neoplasm of large intestine: Secondary | ICD-10-CM | POA: Insufficient documentation

## 2019-07-27 DIAGNOSIS — Z794 Long term (current) use of insulin: Secondary | ICD-10-CM | POA: Insufficient documentation

## 2019-07-27 DIAGNOSIS — E785 Hyperlipidemia, unspecified: Secondary | ICD-10-CM | POA: Insufficient documentation

## 2019-07-27 DIAGNOSIS — Z7982 Long term (current) use of aspirin: Secondary | ICD-10-CM | POA: Insufficient documentation

## 2019-07-27 DIAGNOSIS — E119 Type 2 diabetes mellitus without complications: Secondary | ICD-10-CM | POA: Insufficient documentation

## 2019-07-27 DIAGNOSIS — M129 Arthropathy, unspecified: Secondary | ICD-10-CM | POA: Insufficient documentation

## 2019-07-27 DIAGNOSIS — Z8616 Personal history of COVID-19: Secondary | ICD-10-CM | POA: Insufficient documentation

## 2019-07-27 NOTE — Telephone Encounter (Signed)
I left a detailed message about his Pet scan appt and follow up with Dr Mike Gip. I also left a message at Nursing facility.

## 2019-08-04 ENCOUNTER — Ambulatory Visit: Admission: RE | Admit: 2019-08-04 | Payer: Self-pay | Source: Ambulatory Visit

## 2019-08-05 ENCOUNTER — Inpatient Hospital Stay: Payer: Self-pay | Admitting: Hematology and Oncology

## 2019-08-10 ENCOUNTER — Other Ambulatory Visit: Payer: Self-pay

## 2019-08-10 ENCOUNTER — Ambulatory Visit
Admission: RE | Admit: 2019-08-10 | Discharge: 2019-08-10 | Disposition: A | Discharge status: Home / Self Care | Payer: Self-pay | Source: Ambulatory Visit | Attending: Hematology and Oncology | Admitting: Hematology and Oncology

## 2019-08-10 DIAGNOSIS — N200 Calculus of kidney: Secondary | ICD-10-CM | POA: Insufficient documentation

## 2019-08-10 DIAGNOSIS — J439 Emphysema, unspecified: Secondary | ICD-10-CM | POA: Insufficient documentation

## 2019-08-10 DIAGNOSIS — C01 Malignant neoplasm of base of tongue: Secondary | ICD-10-CM | POA: Insufficient documentation

## 2019-08-10 DIAGNOSIS — R221 Localized swelling, mass and lump, neck: Secondary | ICD-10-CM

## 2019-08-10 DIAGNOSIS — I700 Atherosclerosis of aorta: Secondary | ICD-10-CM | POA: Insufficient documentation

## 2019-08-10 LAB — GLUCOSE, CAPILLARY: Glucose-Capillary: 159 mg/dL — ABNORMAL HIGH (ref 70–99)

## 2019-08-10 IMAGING — CT NM PET TUM IMG RESTAG (PS) SKULL BASE T - THIGH
9 series · 25 of 25 positions shown · non-contrast
Comparison: [DATE] neck CT.  PET [DATE].

CLINICAL DATA: Subsequent treatment strategy for carcinoma of
tongue base. Restaging. COVID positive [DATE], without vaccine.
Diabetes. History of CVA.

EXAM:
NUCLEAR MEDICINE PET SKULL BASE TO THIGH
TECHNIQUE: 9.2 mCi F-18 FDG was injected intravenously. Full-ring PET imaging
was performed from the skull base to thigh after the radiotracer. CT
data was obtained and used for attenuation correction and anatomic
localization.
Fasting blood glucose: 159 mg/dl

[Series 3: ct wb 5.0 b30f · axial · 5.0mm · 0.98mm/px · z∈[-1010,-26]mm · 3 of 329 slices shown]
[im 1/329]
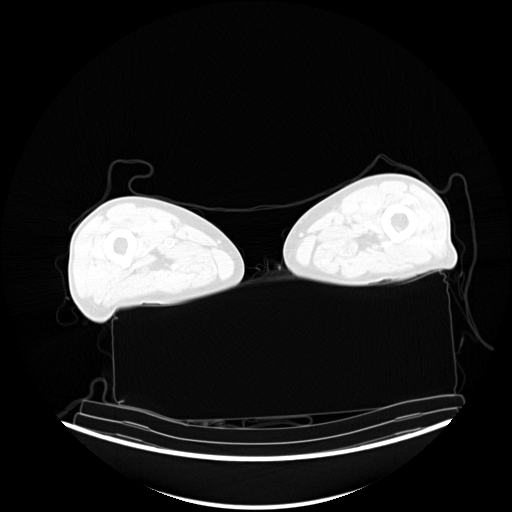
[im 165/329]
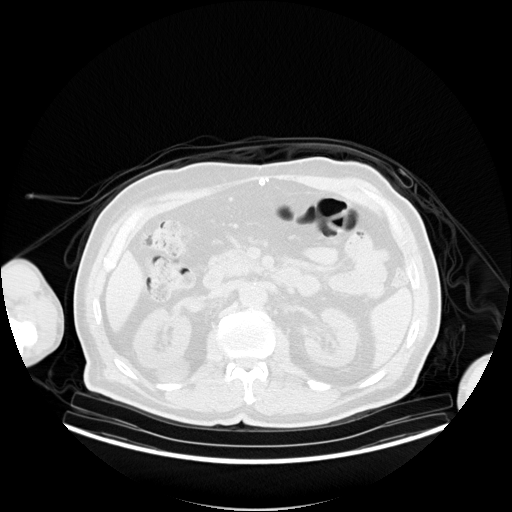
[im 329/329  brain]
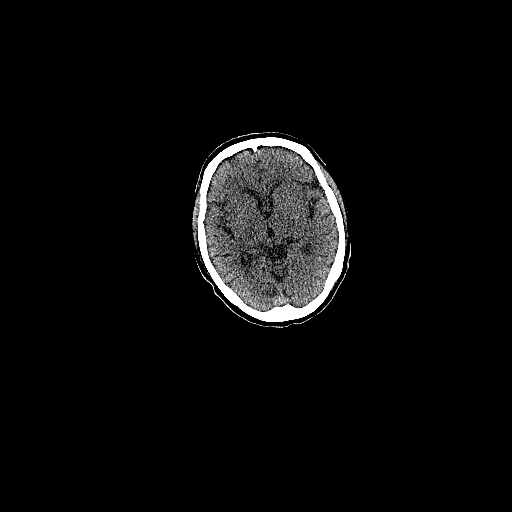

[Series 5: pet wb uncorrected (nac) · axial · 5.0mm · 4.07mm/px · z∈[-1010,-26]mm · 4 of 329 slices shown]
[im 1/329]
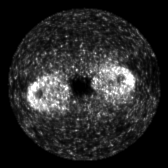
[im 110/329]
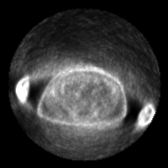
[im 219/329]
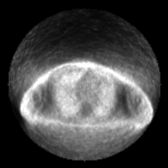
[im 329/329]
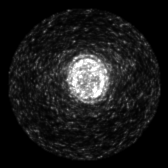

[Series 6: pet wb (ac) · axial · 5.0mm · 2.91mm/px · z∈[-1010,-26]mm · 4 of 329 slices shown]
[im 1/329]
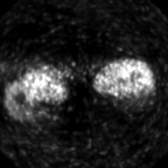
[im 110/329]
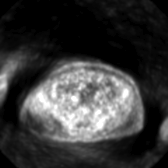
[im 219/329]
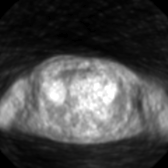
[im 329/329]
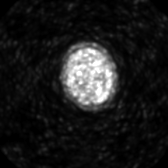

[Series 603: pet_ct axial fused · 4 of 323 slices shown]
[im 1/323]
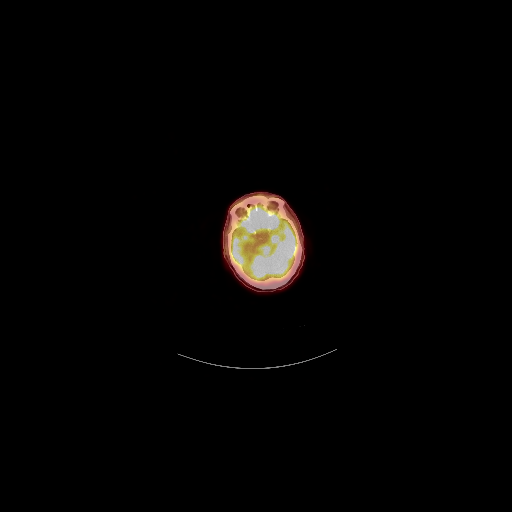
[im 108/323]
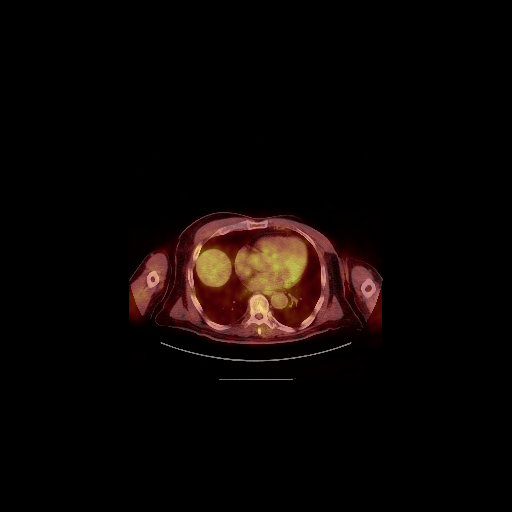
[im 215/323]
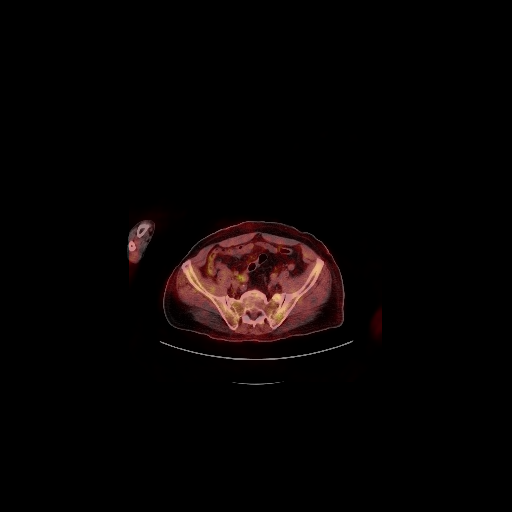
[im 323/323]
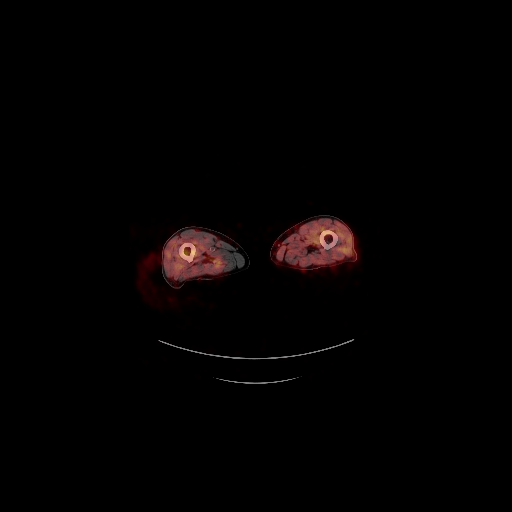

[Series 605: pet_ct sagittal fused · 2 of 151 slices shown]
[im 1/151]
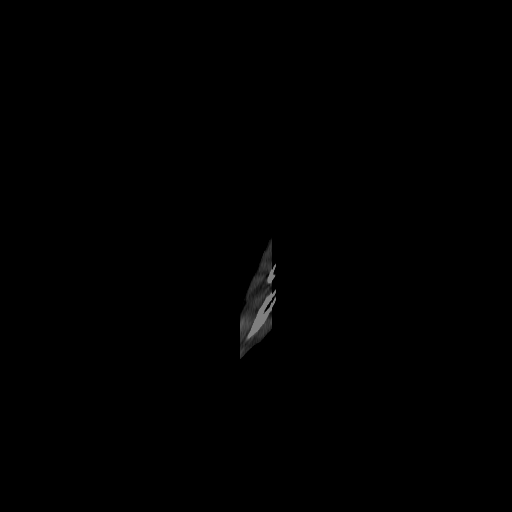
[im 151/151]
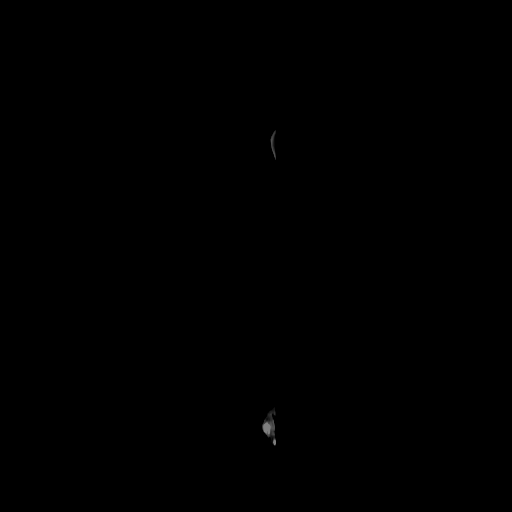

[Series 606: pet axial · 4 of 325 slices shown]
[im 1/325]
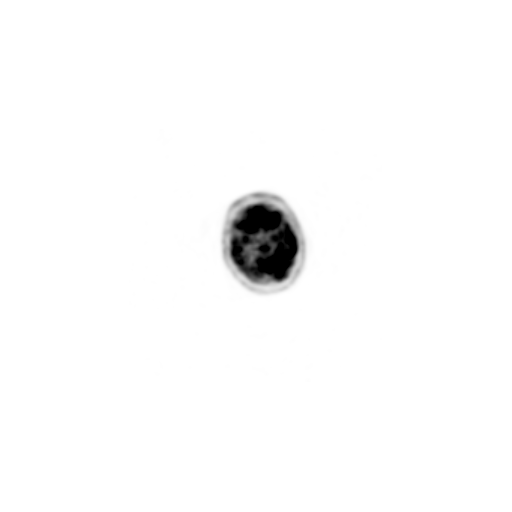
[im 109/325]
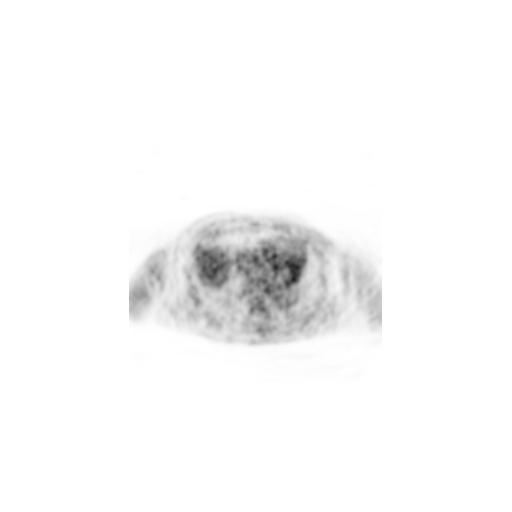
[im 217/325]
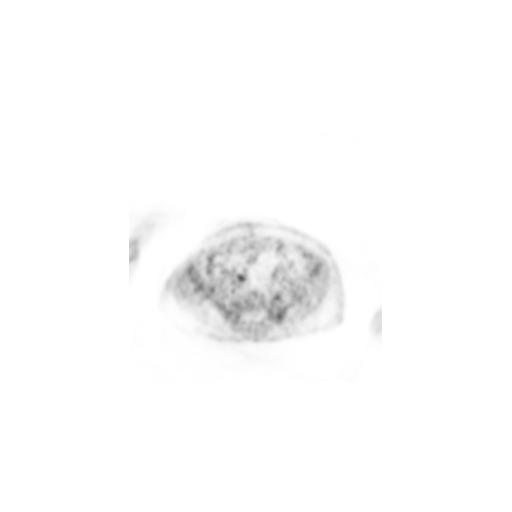
[im 325/325]
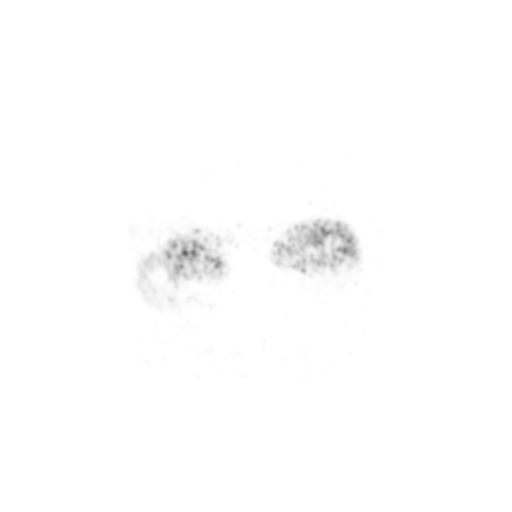

[Series 608: pet sagittal · 2 of 162 slices shown]
[im 1/162]
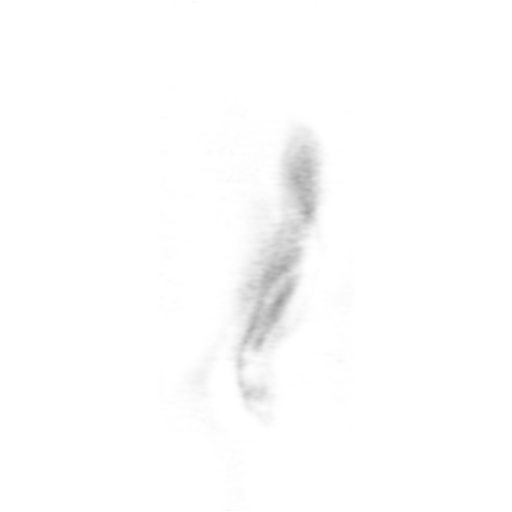
[im 162/162]
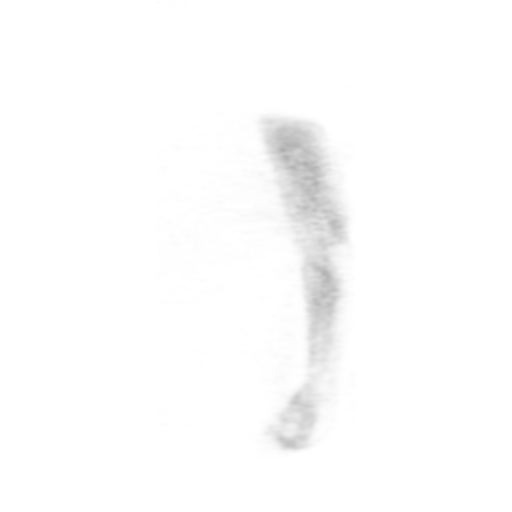

[Series 1286: results mm oncology reading · 3.0mm · 1.06mm/px · 1 of 7 slices shown (1 of 2)]
[im 1/7]
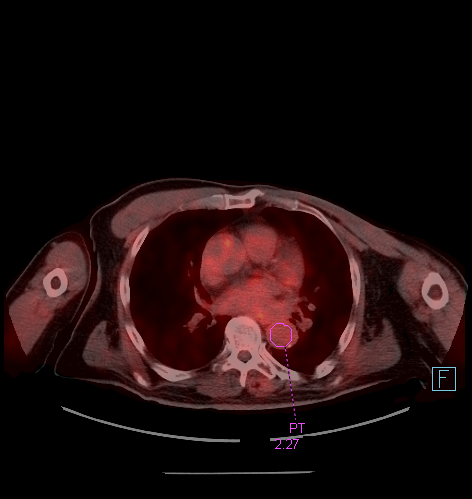

[Series 8119: results mm oncology reading · 5.0mm · 0.85mm/px · 1 of 1 slices shown (2 of 2)]
[im 1/1]
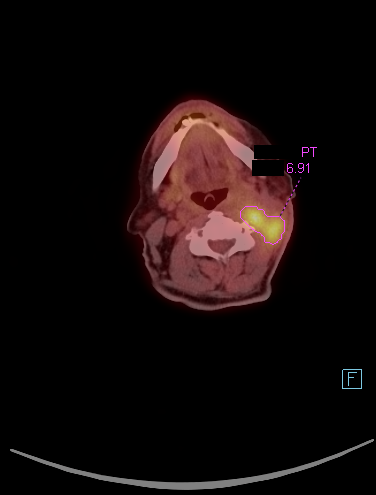

[25 of 25 positions shown; findings below may reference images not displayed]

FINDINGS: Mediastinal blood pool activity: SUV max

Liver activity: SUV max NA

NECK: No head neck mucosal hypermetabolism to suggest residual or
recurrent primary.

Left-sided level 2 nodal mass measures on the order of 3.1 x 4.1 cm
and a S.U.V. max of 6.9 on 38/3.

Contiguous or immediately adjacent left level 3 node measures 2.1 x
2.4 cm and a S.U.V. max of 6.7 on 45/3.

Incidental CT findings: No other enlarged cervical nodes. Right
carotid atherosclerosis.

CHEST: No pulmonary parenchymal or thoracic nodal hypermetabolism.

Incidental CT findings: Aortic atherosclerosis. Centrilobular
emphysema.

ABDOMEN/PELVIS: No abdominopelvic parenchymal or nodal
hypermetabolism.

Incidental CT findings: Punctate right renal collecting system
calculus. Low-density right renal lesions are likely cysts.
Abdominal aortic atherosclerosis.

SKELETON: Spinous process hypermetabolism corresponding to a
partially healed fracture at approximately T3. No underlying osseous
lesion identified. No findings of hypermetabolic metastasis.

Incidental CT findings: Degenerative changes of both hips.
IMPRESSION: 1. Hypermetabolic left-sided cervical nodes, consistent with
recurrent/metastatic disease.
2. No evidence of hypermetabolic extracervical metastasis.
3. Aortic atherosclerosis ([M8]-[M8]) and emphysema ([M8]-[M8]).
4. Right nephrolithiasis.

## 2019-08-10 MED ORDER — FLUDEOXYGLUCOSE F - 18 (FDG) INJECTION
8.9000 | Freq: Once | INTRAVENOUS | Status: AC | PRN
  Administered 2019-08-10: 9.19 via INTRAVENOUS

## 2019-08-16 ENCOUNTER — Other Ambulatory Visit: Payer: Self-pay | Admitting: Hematology and Oncology

## 2019-08-16 DIAGNOSIS — R221 Localized swelling, mass and lump, neck: Secondary | ICD-10-CM

## 2019-08-16 DIAGNOSIS — C01 Malignant neoplasm of base of tongue: Secondary | ICD-10-CM

## 2019-08-16 NOTE — Progress Notes (Signed)
[facility]  [address] [place], [place] [address] Phone: [phone]  Fax: [phone]   Clinic Day:  08/18/2019  Referring physician: Rutherford Limerick, PA  Chief Complaint: Kent [name] is a 64 y.o. 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  HPI: The patient was last seen in the medical oncology clinic on 07/27/2019. 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 He had a 7 x 5 cm rock hard fixed mass in the left SCM area.  PET skull base to thigh on 08/10/2019 revealed hypermetabolic left-sided cervical nodes, consistent with recurrent/metastatic disease and no evidence of hypermetabolic extracervical metastasis. There was a left sided level 2 nodal mass (3.1 x 4.1 cm) and a contiguous or immediately adjacent left level 3 node (2.1 x 2.4 cm). The scan also showed aortic atherosclerosis, emphysema, and right nephrolithiasis.  The patient saw Dr. Meade Maw, neurosurgery, from [place] on 08/11/2019 for aneurysmal disease and intracranial stenosis. He had no severe headaches at that time. Dr. Cari Caraway recommended follow up with a CTA in 1 year.  During the interim, he has been getting better. He is working with physical therapy until he can walk on his own. He has not used his walker yet. He is able to use the restroom by himself.  The patient feels that his throat is dry sometimes, which makes it difficult for him to swallow. He also reports that his left arm is still numb, which makes him unable to write. He still has some tingling in his leg arm, cheek, and left leg, but this has improved. His cough has resolved.   The patient does not have an appointment with the surgeon at [facility] at this time, but they are supposed to be calling him soon.   Past Medical History:  Diagnosis Date    . Arthritis   . Diabetes mellitus without complication ([place])   . Hyperlipidemia   . Hypertension   . Obesity   . Osteomyelitis ([place])    From pt chart  . Renal insufficiency    CKD noted on chart  . Staph infection 05/29/2016   Nov 2017. Staph infection in LT shoulder requiring debridement and grafts  . Status post debridement   . 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 Tongue cancer ([place])    Stage IV squamous cell carcinoma of the base of the tongue/left tonsil    Past Surgical History:  Procedure Laterality Date  . EXCISION OF TONGUE LESION Left 06/19/2016   Procedure: EXCISION OF TONGUE LESION;  Surgeon: Margaretha Sheffield, MD;  Location: [place];  Service: ENT;  Laterality: Left;  diabetic - insulin and oral meds - not using either currently  . gastric tumor removed  2010  . I & D EXTREMITY Left 01/09/2016   Procedure: IRRIGATION AND DEBRIDEMENT EXTREMITY;  Surgeon: Robert Bellow, MD;  Location: [facility];  Service: General;  Laterality: Left;  . LARYNGOSCOPY N/A 06/19/2016   Procedure: LARYNGOSCOPY;  Surgeon: Margaretha Sheffield, MD;  Location: [place];  Service: ENT;  Laterality: N/A;  . PERIPHERAL VASCULAR CATHETERIZATION Right 12/31/2015   Procedure: Lower Extremity Angiography;  Surgeon: Algernon Huxley, MD;  Location: [facility];  Service: Cardiovascular;  Laterality: Right;  . tongue mass biopsy  Family History  Problem Relation Age of Onset  . Heart disease Mother   . Hypertension Father   . Cancer Sister        stomach  . Heart disease Brother        MI  . Cancer Daughter   . Cancer Son        testicular  . ADD / ADHD Sister     Social History:  reports that he has never smoked. He has never used smokeless tobacco. He reports that he does not drink alcohol and does not use drugs. He smokes a pipe and cigar rarely (once every 3 months).  He has a son and daughter. 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 He currently lives at the [name] in [place], [place]. The patient is accompanied by [facility], a physical therapist who will report back to his wife, [name].  Allergies: No Known Allergies  Current Medications: Current Outpatient Medications  Medication Sig Dispense Refill  . AMLODIPINE BESYLATE PO Take 5 mg by mouth daily.    Marland Kitchen aspirin EC 81 MG EC tablet Take 1 tablet (81 mg total) by mouth daily.    Marland Kitchen atorvastatin (LIPITOR) 40 MG tablet Take 1 tablet (40 mg total) by mouth daily.    . insulin detemir (LEVEMIR) 100 UNIT/ML injection Inject 0.08 mLs (8 Units total) into the skin daily. 10 mL 11  . losartan (COZAAR) 50 MG tablet Take 50 mg by mouth daily.    . polyethylene glycol (MIRALAX / GLYCOLAX) 17 g packet Take 17 g by mouth 2 (two) times daily. 14 each 0   No current facility-administered medications for this visit.   Review of Systems  Constitutional: Negative.  Negative for chills, diaphoresis, fever, malaise/fatigue and weight loss (up 7 lbs).  HENT: Positive for hearing loss (secondary to stroke on 06/22/2019). Negative for congestion, sore throat and tinnitus.        Swallowing is improving, but mouth still feels dry.  Eyes: Negative.  Negative for blurred vision and double vision.  Respiratory: Negative.  Negative for cough and shortness of breath.   Cardiovascular: Negative.  Negative for chest pain, palpitations and leg swelling.  Gastrointestinal: Negative.  Negative for abdominal pain, blood in stool, constipation, diarrhea, heartburn, melena, nausea and vomiting.        Difficulty swallowing due to lump on left side of neck.    Genitourinary: Negative.  Negative for dysuria, flank pain, frequency, hematuria and urgency.  Musculoskeletal: Negative.  Negative for back pain, falls, joint pain, myalgias and neck pain.  Skin: Negative.  Negative for itching and rash.  Neurological: Positive for tingling (left arm and leg, improving), sensory change (numbness in  left cheek, arm, and left leg s/p CVA, improving) and weakness (left arm and leg s/p CVA). Negative for dizziness, tremors, speech change, focal weakness and headaches.       CVA on 06/22/2019  Endo/Heme/Allergies: Negative.  Does not bruise/bleed easily.       Diabetes  Psychiatric/Behavioral: Negative.  Negative for depression. The patient is not nervous/anxious.   All other systems reviewed and are negative.    Performance status (ECOG):  2  Vitals Blood pressure 107/68, pulse 76, temperature (!) 97.4 F (36.3 C), temperature source Tympanic, weight 178 lb 5.6 oz (80.9 kg), SpO2 99 %.   Physical Exam Vitals and nursing note reviewed.  Constitutional:      General: He is not in acute distress.  Appearance: He is not diaphoretic.     Interventions: Face mask in place.     Comments: Sitting comfortably in a wheelchair.  HENT:     Head: Normocephalic and atraumatic.     Comments: Short gray hair. Goatee.    Right Ear: Hearing normal.     Left Ear: Hearing normal.     Mouth/Throat:     Mouth: Mucous membranes are moist. No oral lesions.     Pharynx: Oropharynx is clear. No oropharyngeal exudate.     Comments: Poor dentition Eyes:     General: No scleral icterus.       Right eye: No discharge.        Left eye: No discharge.     Extraocular Movements: Extraocular movements intact.     Conjunctiva/sclera: Conjunctivae normal.     Pupils: Pupils are equal, round, and reactive to light.     Comments: Blue eyes.  Neck:     Vascular: No JVD.     Comments: Left SCM rock hard and fixed (7.5 cm x 5 cm). Cardiovascular:     Rate and Rhythm: Normal rate and regular rhythm.     Heart sounds: Normal heart sounds. No murmur heard.  No friction rub. No gallop.   Pulmonary:     Effort: Pulmonary effort is normal.     Breath sounds: Normal breath sounds. No wheezing, rhonchi or rales.  Abdominal:     General: Bowel sounds are normal. There is no distension.     Palpations: Abdomen is  soft. There is no hepatomegaly, splenomegaly or mass.     Tenderness: There is no abdominal tenderness. There is no guarding or rebound.  Musculoskeletal:        General: No swelling, tenderness or deformity. Normal range of motion.     Cervical back: Normal range of motion and neck supple.  Lymphadenopathy:     Head:     Right side of head: No preauricular, posterior auricular or occipital adenopathy.     Left side of head: No occipital adenopathy.     Cervical: No cervical adenopathy.     Upper Body:     Right upper body: No supraclavicular or axillary adenopathy.     Left upper body: No supraclavicular or axillary adenopathy.     Lower Body: No right inguinal adenopathy. No left inguinal adenopathy.  Skin:    General: Skin is warm and dry.     Coloration: Skin is not pale.     Findings: No bruising, erythema, lesion or rash.  Neurological:     Mental Status: He is alert and oriented to person, place, and time.  Psychiatric:        Mood and Affect: Mood normal.        Behavior: Behavior normal.        Thought Content: Thought content normal.        Judgment: Judgment normal.    Admissions: [facility] from 11/15/2015 - 11/16/2015 for hyponatremia (sodium 122).  He received IVF overnight.   [facility] from 12/08/2015 - 12/09/2015 for hydration and electrolyte replacement. Sodium was 126.   [facility] from 12/09/2015 - 12/12/2015 secondary to nausea, vomiting, and minimal oral intake.  He received IVF with electrolyte replacement and anti-emetics.  A PICC line was placed for IVF [facility] from 12/28/2015 - 12/31/2015 with dehydration and hyponatremia.  He received IVF.  He was discovered to have dry gangrene of the right second toe.  Angiogram revealed good perfusion. [place] from 01/08/2016 -  01/10/2016 with necrotizing fasciitis of the left shoulder.  Blood cultures grew staph aureus.  He was treated with vancomycin, Zosyn, and clindamycin. He  underwent debridement of the left shoulder and wound VAC  placement on 01/09/2016.  Intra-operative wound cultures on 11/20/207 revealed MSSA, pan sensitive.   [facility] from 01/10/2016 - 01/22/2016 for wound care.  He underwent a second debridement on 01/11/2016.  He underwent fasciocutaneous pectoral flap to the left shoulder with layered wound closure of the posterior shoulder and split thickness skin graft to the pectoralis major on 01/17/2016.  Bone biopsy revealed ragged necrotic bone with associated acute inflammation c/w acute osteomyelitis.  TEE revealed an AV vegetation versus Lambl's excrescence.  He was transitioned to linezolid due to vancomycin shortage.  He also received clindamycin and Zosyn.  He was treated with at least 4 weeks of antibiotics for presumed septic arthritis. [place] from 06/22/2019 - 07/05/2019 for an ischemic CVA.  Head MRI on 06/22/2019 showed acute/subacute infarcts in the right occipital, right temporal and right thalamic regions with some associated petechial hemorrhage. CTA on 06/24/2019 showed multiple areas of stenosis/occlusion including left vertebral origin moderate to severe, right PCA occlusion, left PCA severe, proximal right M2 moderate to severe.  Echo with bubble study was negative.   No visits with results within 3 Day(s) from this visit.  Latest known visit with results is:  Hospital Outpatient Visit on 08/10/2019  Component Date Value Ref Range Status  . Glucose-Capillary 08/10/2019 159* 70 - 99 mg/dL Final   Glucose reference range applies only to samples taken after fasting for at least 8 hours.    Assessment:  Kent [name] is a 64 y.o. 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  Tumor was p16 IHC positive (high risk HPV).  He received 3 cycles of cisplatin (10/22/2015 - 12/04/2015) with concurrent radiation.  Radiation completed on 12/21/2015.  CT soft tissue neck on 08/17/2015 revealed a slowly progressive over years, infiltrative, deep 4.1 x 5.4 cm  LEFT parotid mass with widespread ipsilateral bulky adenopathy.  PET scan at [facility] on 09/17/2015 revealed left base of tongue malignancy corresponding to asymmetric enhancement identified on the neck CT with hypermetabolic extensive conglomerate left cervical adenopathy (level II-IV) extending from the level of the angle of the mandible down inferiorly to the level of the cricoid cartilage.  There was clustered subcentimeter left level IB submandibular nodes with mild FDG activity.  There was no evidence of metastatic disease.  PET scan on 05/29/2016 revealed asymmetric hypermetabolism posterior left hilum.  There was mild left cervical lymphadenopathy with hypermetabolism, qualitatively decreased when comparing back to 09/17/2015 exam.  There were tiny bilateral pulmonary nodules, too small to detect on PET imaging.   Laryngoscopy and biopsy of the left tongue base and left retromalar trigone on 06/19/2016 revealed no evidence of malignancy.  Biopsies of the left tongue base noted candidiasis with reactive atypia. Retromolar triangle revealed squamous mucosa with candidiasis, ulceration and changes due to radiation therapy.  Soft tissue neck CT with contrast on 06/24/2019 revealed a 7 cm ill-defined left neck mass, increased from 2018 and c/w recurrent/progressive tumor.  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  There was patchy ground-glass opacities in both lung apices c/w COVID-19 pneumonia.  PET scan on 08/10/2019 revealed hypermetabolic left-sided cervical nodes, consistent with recurrent/metastatic disease and no evidence of hypermetabolic extracervical metastasis. There was a left sided level 2 nodal mass (3.1 x 4.1 cm) and a  contiguous or immediately adjacent left level 3 node (2.1 x 2.4 cm).   Alpha gal IgE was 1.21 (< 0.35) c/w IgE antibodies to galactose-alpha-1,3 galactose (oligosaccharide part on Fab portion of the cetuximab heavy chain) and suggestive of potential cetuximab  reaction.  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  Pathologic stage was T4NxM0.  He took imatinib for 1 year.  He has a history of recurrent hyponatremia.   He was diagnosed with COVID-19 on 06/03/2019.   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  Plan: 1.   Clinical stage IVB (T2N3M0) base of tongue squamous cell carcinoma  He received concurrent cisplatin and radiation (completed 12/2015).  Soft tissue neck CT on 06/24/2019 revealed an enlarging mass worrisome for recurrent disease.  Direct laryngoscopy on 07/19/2019 revealed no masses.  PET scan on 08/10/2019 was personally reviewed with the patient.  Agree with radiology interpretation.   He has a conglomerate left neck adenopathy c/w recurrent disease.  Exam reveals a fixed large left neck mass.  Discuss plans for referral to [facility] for possible resection with curative intent.  Discuss plans to check PD-L1 status on tissue.   Multiple questions asked and answered. 2.   Tumor board today. 3.   Contact patient and his wife after tumor board. 4.   Anticipate surgery at [facility]. 5.   RTC 1-2 weeks after surgery. Please schedule appt in 1 month.  I discussed the assessment and treatment plan with the patient.  The patient was provided an opportunity to ask questions and all were answered.  The patient agreed with the plan and demonstrated an understanding of the instructions.  The patient was advised to call back if the symptoms worsen or if the condition fails to improve as anticipated.  I provided 11 minutes (8:57 AM - 9:08 AM) of face-to-face time during this encounter and > 50% was spent counseling as documented under my assessment and plan.  An additional 10 minutes were spent reviewing his chart (Epic and Care Everywhere) including notes, labs, and imaging studies.  I discussed his imaging with  Dr Kathyrn Sheriff.   Lequita Asal, MD, PhD    08/18/2019, 8:57 AM  I, De Burrs, am acting as Education administrator for [employer]. Mike Gip, MD, PhD.  I, Melissa C. Mike Gip, MD, have reviewed the above documentation for accuracy and completeness, and I agree with the above.

## 2019-08-18 ENCOUNTER — Other Ambulatory Visit: Payer: Self-pay

## 2019-08-18 ENCOUNTER — Inpatient Hospital Stay: Payer: Self-pay | Attending: Hematology and Oncology | Admitting: Hematology and Oncology

## 2019-08-18 ENCOUNTER — Encounter: Payer: Self-pay | Admitting: Hematology and Oncology

## 2019-08-18 VITALS — BP 107/68 | HR 76 | Temp 97.4°F | Wt 178.4 lb

## 2019-08-18 DIAGNOSIS — C01 Malignant neoplasm of base of tongue: Secondary | ICD-10-CM | POA: Insufficient documentation

## 2019-08-18 DIAGNOSIS — Z7189 Other specified counseling: Secondary | ICD-10-CM

## 2019-08-18 DIAGNOSIS — Z923 Personal history of irradiation: Secondary | ICD-10-CM | POA: Insufficient documentation

## 2019-08-18 DIAGNOSIS — R221 Localized swelling, mass and lump, neck: Secondary | ICD-10-CM

## 2019-08-18 DIAGNOSIS — C770 Secondary and unspecified malignant neoplasm of lymph nodes of head, face and neck: Secondary | ICD-10-CM | POA: Insufficient documentation

## 2019-08-18 DIAGNOSIS — Z9221 Personal history of antineoplastic chemotherapy: Secondary | ICD-10-CM | POA: Insufficient documentation

## 2019-08-18 NOTE — Progress Notes (Signed)
Patient denies new problems/concerns today.   °

## 2019-08-18 NOTE — Progress Notes (Signed)
Tumor Board Documentation  Leemon Ayala Hagos was presented by Dr Mike Gip at our Tumor Board on 08/18/2019, which included representatives from medical oncology, radiation oncology, surgical oncology, internal medicine, navigation, pathology, radiology, surgical, pharmacy, genetics, research, pulmonology.  Marshun currently presents as a current patient, for discussion with history of the following treatments: active survellience, neoadjuvant chemoradiation.  Additionally, we reviewed previous medical and familial history, history of present illness, and recent lab results along with all available histopathologic and imaging studies. The tumor board considered available treatment options and made the following recommendations: Surgery Referred to [facility] for surgical evaluation  The following procedures/referrals were also placed: No orders of the defined types were placed in this encounter.   Clinical Trial Status: not discussed   Staging used: AJCC Stage Group  AJCC Staging: T: 2 N: 3 M: 0 Group: Squamous Cell Carcinoma of Base of Tongue   National site-specific guidelines NCCN were discussed with respect to the case.  Tumor board is a meeting of clinicians from various specialty areas who evaluate and discuss patients for whom a multidisciplinary approach is being considered. Final determinations in the plan of care are those of the provider(s). The responsibility for follow up of recommendations given during tumor board is that of the provider.   Today's extended care, comprehensive team conference, Khani was not present for the discussion and was not examined.   Multidisciplinary Tumor Board is a multidisciplinary case peer review process.  Decisions discussed in the Multidisciplinary Tumor Board reflect the opinions of the specialists present at the conference without having examined the patient.  Ultimately, treatment and diagnostic decisions rest with the primary provider(s) and the  patient.

## 2019-09-13 ENCOUNTER — Telehealth: Payer: Self-pay | Admitting: *Deleted

## 2019-09-13 NOTE — Telephone Encounter (Signed)
Spoke with patients wife listed on DPR and changed patients appt. time

## 2019-09-20 ENCOUNTER — Inpatient Hospital Stay: Payer: Medicare Other | Attending: Hematology and Oncology | Admitting: Hematology and Oncology

## 2019-09-20 ENCOUNTER — Inpatient Hospital Stay: Payer: Self-pay | Admitting: Hematology and Oncology

## 2019-09-27 NOTE — Progress Notes (Signed)
[facility]  [address], [address] [place], [place] [address] Phone: [phone]  Fax: [phone]   Clinic Day:  09/28/2019  Referring physician: Rutherford Limerick, PA  Chief Complaint: Kent [name] is a 64 y.o. 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  HPI: The patient was last seen in the medical oncology clinic on 08/18/2019. 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 We discussed plans for referral to [facility] for possible resection with curative intent.  Tumor Board on 08/18/2019 recommended surgery. The patient was referred to [facility].  The patient saw Dr. Ihor Austin on 09/07/2019 to discuss treatment options. Exam revealed a large, firm mass spanning almost the entirety of the left neck. They discussed hospice vs chemo/immunotherapy vs surgery. FNA in clinic revealed atypical squamous cells, suspicious for squamous cell carcinoma. His case was to be reviewed at the [facility] Tumor Board.  During the interim, he has been "good." He is able to take care of all of his ADLs. He uses a cane to ambulate and denies recent falls. He notes that he is unable to mow the lawn. Dr. Ihor Austin told him that surgery would be too risky due to his stroke. The patient wants to proceed with treatment.  When he swallows solid foods, it feels like there is a dry spot. He denies any neck pain. His hearing has returned to normal. He still has numbness and tingling in his left cheek, arm, and leg s/p stroke. His left sided weakness has resolved.  The patient and his wife agree to ultrasound guided left neck biopsy.   Past Medical History:  Diagnosis Date  . Arthritis   . Diabetes mellitus without complication ([place])   . Hyperlipidemia   . Hypertension   . Obesity   . Osteomyelitis ([place])    From pt chart  . Renal insufficiency    CKD noted on chart  . Staph  infection 05/29/2016   Nov 2017. Staph infection in LT shoulder requiring debridement and grafts  . Status post debridement   . 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 Tongue cancer ([place])    Stage IV squamous cell carcinoma of the base of the tongue/left tonsil    Past Surgical History:  Procedure Laterality Date  . EXCISION OF TONGUE LESION Left 06/19/2016   Procedure: EXCISION OF TONGUE LESION;  Surgeon: Margaretha Sheffield, MD;  Location: [place];  Service: ENT;  Laterality: Left;  diabetic - insulin and oral meds - not using either currently  . gastric tumor removed  2010  . I & D EXTREMITY Left 01/09/2016   Procedure: IRRIGATION AND DEBRIDEMENT EXTREMITY;  Surgeon: Robert Bellow, MD;  Location: [facility] ORS;  Service: General;  Laterality: Left;  . LARYNGOSCOPY N/A 06/19/2016   Procedure: LARYNGOSCOPY;  Surgeon: Margaretha Sheffield, MD;  Location: [place];  Service: ENT;  Laterality: N/A;  . PERIPHERAL VASCULAR CATHETERIZATION Right 12/31/2015   Procedure: Lower Extremity Angiography;  Surgeon: Algernon Huxley, MD;  Location: [facility];  Service: Cardiovascular;  Laterality: Right;  . tongue mass biopsy      Family History  Problem Relation Age of Onset  . Heart disease Mother   . Hypertension Father   . Cancer Sister        stomach  . Heart disease Brother  MI  . Cancer Daughter   . Cancer Son        testicular  . ADD / ADHD Sister     Social History:  reports that he has never smoked. He has never used smokeless tobacco. He reports that he does not drink alcohol and does not use drugs. He smokes a pipe and cigar rarely (once every 3 months).  He has a son and daughter. 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 He currently lives at the [facility] in [place], [place]. The patient is accompanied by his wife, Kent [name].  Allergies: No Known Allergies  Current Medications: Current Outpatient Medications  Medication Sig  Dispense Refill  . AMLODIPINE BESYLATE PO Take 5 mg by mouth daily.    Marland Kitchen aspirin EC 81 MG EC tablet Take 1 tablet (81 mg total) by mouth daily.    Marland Kitchen atorvastatin (LIPITOR) 40 MG tablet Take 1 tablet (40 mg total) by mouth daily.    . insulin detemir (LEVEMIR) 100 UNIT/ML injection Inject 0.08 mLs (8 Units total) into the skin daily. 10 mL 11  . losartan (COZAAR) 50 MG tablet Take 50 mg by mouth daily.    . polyethylene glycol (MIRALAX / GLYCOLAX) 17 g packet Take 17 g by mouth 2 (two) times daily. (Patient not taking: Reported on 09/28/2019) 14 each 0   No current facility-administered medications for this visit.   Review of Systems  Constitutional: Negative.  Negative for chills, diaphoresis, fever, malaise/fatigue and weight loss (up 4 lbs).       Doing "good."  HENT: Negative for congestion, ear discharge, ear pain, hearing loss (has returned to normal), nosebleeds, sinus pain, sore throat and tinnitus.        Feels like there is a "dry spot" when he swallows solids.  Eyes: Negative.  Negative for blurred vision and double vision.  Respiratory: Negative.  Negative for cough, hemoptysis, sputum production and shortness of breath.   Cardiovascular: Negative.  Negative for chest pain, palpitations and leg swelling.  Gastrointestinal: Negative.  Negative for abdominal pain, blood in stool, constipation, diarrhea, heartburn, melena, nausea and vomiting.  Genitourinary: Negative.  Negative for dysuria, flank pain, frequency, hematuria and urgency.  Musculoskeletal: Negative.  Negative for back pain, falls, joint pain, myalgias and neck pain.  Skin: Negative.  Negative for itching and rash.  Neurological: Positive for tingling (left arm and leg) and sensory change (numbness in left cheek, arm, and left leg s/p CVA). Negative for dizziness, tremors, speech change, focal weakness, weakness and headaches.       CVA on 06/22/2019  Endo/Heme/Allergies: Negative.  Does not bruise/bleed easily.        Diabetes  Psychiatric/Behavioral: Negative.  Negative for depression. The patient is not nervous/anxious.   All other systems reviewed and are negative.    Performance status (ECOG):  1  Vitals Blood pressure 112/76, pulse 77, temperature (!) 97.4 F (36.3 C), temperature source Tympanic, resp. rate 18, height 5' 7"  (1.702 m), weight 182 lb 6.4 oz (82.7 kg), SpO2 100 %.   Physical Exam Vitals and nursing note reviewed.  Constitutional:      General: He is not in acute distress.    Appearance: He is not diaphoretic.     Interventions: Face mask in place.     Comments: Cane by his side.  HENT:     Head: Normocephalic and atraumatic.     Comments: Short gray hair. Goatee.  Right Ear: Hearing normal.     Left Ear: Hearing normal.     Mouth/Throat:     Mouth: Mucous membranes are moist. No oral lesions.     Pharynx: Oropharynx is clear. No oropharyngeal exudate.     Comments: Poor dentition Eyes:     General: No scleral icterus.       Right eye: No discharge.        Left eye: No discharge.     Extraocular Movements: Extraocular movements intact.     Conjunctiva/sclera: Conjunctivae normal.     Pupils: Pupils are equal, round, and reactive to light.     Comments: Blue eyes.  Neck:     Vascular: No JVD.     Comments: Left SCM rock hard and fixed (7.5 cm craniocaudal, 8 cm AP). Cardiovascular:     Rate and Rhythm: Normal rate and regular rhythm.     Heart sounds: Normal heart sounds. No murmur heard.  No friction rub. No gallop.   Pulmonary:     Effort: Pulmonary effort is normal.     Breath sounds: Normal breath sounds. No wheezing, rhonchi or rales.  Abdominal:     General: Bowel sounds are normal. There is no distension.     Palpations: Abdomen is soft. There is no hepatomegaly, splenomegaly or mass.     Tenderness: There is no abdominal tenderness. There is no guarding or rebound.  Musculoskeletal:        General: No swelling, tenderness or deformity. Normal range of  motion.     Cervical back: Normal range of motion and neck supple.  Lymphadenopathy:     Head:     Right side of head: No preauricular, posterior auricular or occipital adenopathy.     Left side of head: No preauricular, posterior auricular or occipital adenopathy.     Cervical: No cervical adenopathy.     Upper Body:     Right upper body: No supraclavicular or axillary adenopathy.     Left upper body: No supraclavicular or axillary adenopathy.     Lower Body: No right inguinal adenopathy. No left inguinal adenopathy.  Skin:    General: Skin is warm and dry.     Coloration: Skin is not pale.     Findings: No bruising, erythema, lesion or rash.  Neurological:     Mental Status: He is alert and oriented to person, place, and time.  Psychiatric:        Mood and Affect: Mood normal.        Behavior: Behavior normal.        Thought Content: Thought content normal.        Judgment: Judgment normal.    Admissions: [facility] from 11/15/2015 - 11/16/2015 for hyponatremia (sodium 122).  He received IVF overnight.   [facility] from 12/08/2015 - 12/09/2015 for hydration and electrolyte replacement. Sodium was 126.   [facility] from 12/09/2015 - 12/12/2015 secondary to nausea, vomiting, and minimal oral intake.  He received IVF with electrolyte replacement and anti-emetics.  A PICC line was placed for IVF [facility] from 12/28/2015 - 12/31/2015 with dehydration and hyponatremia.  He received IVF.  He was discovered to have dry gangrene of the right second toe.  Angiogram revealed good perfusion. [place] from 01/08/2016 - 01/10/2016 with necrotizing fasciitis of the left shoulder.  Blood cultures grew staph aureus.  He was treated with vancomycin, Zosyn, and clindamycin. He  underwent debridement of the left shoulder and wound VAC placement on 01/09/2016.  Intra-operative wound cultures  on 11/20/207 revealed MSSA, pan sensitive.   [facility] from 01/10/2016 - 01/22/2016 for wound care.  He underwent a second debridement on  01/11/2016.  He underwent fasciocutaneous pectoral flap to the left shoulder with layered wound closure of the posterior shoulder and split thickness skin graft to the pectoralis major on 01/17/2016.  Bone biopsy revealed ragged necrotic bone with associated acute inflammation c/w acute osteomyelitis.  TEE revealed an AV vegetation versus Lambl's excrescence.  He was transitioned to linezolid due to vancomycin shortage.  He also received clindamycin and Zosyn.  He was treated with at least 4 weeks of antibiotics for presumed septic arthritis. [place] from 06/22/2019 - 07/05/2019 for an ischemic CVA.  Head MRI on 06/22/2019 showed acute/subacute infarcts in the right occipital, right temporal and right thalamic regions with some associated petechial hemorrhage. CTA on 06/24/2019 showed multiple areas of stenosis/occlusion including left vertebral origin moderate to severe, right PCA occlusion, left PCA severe, proximal right M2 moderate to severe.  Echo with bubble study was negative.   No visits with results within 3 Day(s) from this visit.  Latest known visit with results is:  Hospital Outpatient Visit on 08/10/2019  Component Date Value Ref Range Status  . Glucose-Capillary 08/10/2019 159* 70 - 99 mg/dL Final   Glucose reference range applies only to samples taken after fasting for at least 8 hours.    Assessment:  Kent [name] is a 64 y.o. 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  Tumor was p16 IHC positive (high risk HPV).  He received 3 cycles of cisplatin (10/22/2015 - 12/04/2015) with concurrent radiation.  Radiation completed on 12/21/2015.  CT soft tissue neck on 08/17/2015 revealed a slowly progressive over years, infiltrative, deep 4.1 x 5.4 cm LEFT parotid mass with widespread ipsilateral bulky adenopathy.  [facility] on 09/17/2015 revealed left base of tongue malignancy corresponding to asymmetric enhancement identified on  the neck CT with hypermetabolic extensive conglomerate left cervical adenopathy (level II-IV) extending from the level of the angle of the mandible down inferiorly to the level of the cricoid cartilage.  There was clustered subcentimeter left level IB submandibular nodes with mild FDG activity.  There was no evidence of metastatic disease.  PET scan on 05/29/2016 revealed asymmetric hypermetabolism posterior left hilum.  There was mild left cervical lymphadenopathy with hypermetabolism, qualitatively decreased when comparing back to 09/17/2015 exam.  There were tiny bilateral pulmonary nodules, too small to detect on PET imaging.   Laryngoscopy and biopsy of the left tongue base and left retromalar trigone on 06/19/2016 revealed no evidence of malignancy.  Biopsies of the left tongue base noted candidiasis with reactive atypia. Retromolar triangle revealed squamous mucosa with candidiasis, ulceration and changes due to radiation therapy.  Soft tissue neck CT with contrast on 06/24/2019 revealed a 7 cm ill-defined left neck mass, increased from 2018 and c/w recurrent/progressive tumor.  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  There was patchy ground-glass opacities in both lung apices c/w COVID-19 pneumonia.  PET scan on 08/10/2019 revealed hypermetabolic left-sided cervical nodes, consistent with recurrent/metastatic disease and no evidence of hypermetabolic extracervical metastasis. There was a left sided level 2 nodal mass (3.1 x 4.1 cm) and a contiguous or immediately adjacent left level 3 node (2.1 x 2.4 cm).   Alpha gal IgE was 1.21 (< 0.35) c/w IgE antibodies to galactose-alpha-1,3 galactose (oligosaccharide part on Fab portion of the cetuximab heavy chain) and suggestive of potential  cetuximab reaction.  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  Pathologic stage  was T4NxM0.  He took imatinib for 1 year.  He has a history of recurrent hyponatremia.   He was diagnosed with COVID-19 on 06/03/2019.   Symptomatically, he feels "good." He is able to take care of all of his ADLs. He uses a cane to ambulate.  He has numbness and tingling in his left cheek, arm, and leg s/p stroke. Left sided weakness has resolved.  Exam reveals a 7.5 x 8 cm fixed left SCM mass.  Plan: 1.   Clinical stage IVB (T2N3M0) base of tongue squamous cell carcinoma  He received concurrent cisplatin and radiation (completed 12/2015).  Soft tissue neck CT on 06/24/2019 revealed an enlarging mass worrisome for recurrent disease.  Direct laryngoscopy on 07/19/2019 revealed no masses.  PET scan on 08/10/2019 revealed a conglomerate left neck adenopathy c/w recurrent disease.  Exam reveals a 7.5 x 8 cm fixed left neck mass.  Review [facility] E[facility] consultation.  Patient is not a surgical candidate.  FNA in [facility] clinic revealed atypical squamous cells, suspicious for squamous cell carcinoma.   Discuss plans for repeat biopsy for confirmation of diagnosis and Foundation One testing.   Check PD-L1 status on tissue.   Discuss plan for treatment based on testing. 2.   Ultrasound guided left neck biopsy. 3.   RTC 1 week after biopsy for MD assessment and discussion regarding direction of therapy.  I discussed the assessment and treatment plan with the patient.  The patient was provided an opportunity to ask questions and all were answered.  The patient agreed with the plan and demonstrated an understanding of the instructions.  The patient was advised to call back if the symptoms worsen or if the condition fails to improve as anticipated.  I provided 16 minutes of face-to-face time during this encounter and > 50% was spent counseling as documented under my assessment and plan.  An additional 10 minutes were spent reviewing his chart (Epic and Care Everywhere) including notes, labs, and imaging studies.      Lequita Asal, MD, PhD    09/28/2019, 1:52 PM  I, De Burrs, am acting as scribe for [employer]. Mike Gip, MD, PhD.  I, Shawntavia Saunders C. Mike Gip, MD, have reviewed the above documentation for accuracy and completeness, and I agree with the above.

## 2019-09-28 ENCOUNTER — Encounter: Payer: Self-pay | Admitting: Hematology and Oncology

## 2019-09-28 ENCOUNTER — Inpatient Hospital Stay: Payer: Medicare Other | Attending: Hematology and Oncology | Admitting: Hematology and Oncology

## 2019-09-28 ENCOUNTER — Other Ambulatory Visit: Payer: Self-pay

## 2019-09-28 VITALS — BP 112/76 | HR 77 | Temp 97.4°F | Resp 18 | Ht 67.0 in | Wt 182.4 lb

## 2019-09-28 DIAGNOSIS — Z7189 Other specified counseling: Secondary | ICD-10-CM | POA: Diagnosis not present

## 2019-09-28 DIAGNOSIS — Z8616 Personal history of COVID-19: Secondary | ICD-10-CM | POA: Diagnosis not present

## 2019-09-28 DIAGNOSIS — R221 Localized swelling, mass and lump, neck: Secondary | ICD-10-CM | POA: Insufficient documentation

## 2019-09-28 DIAGNOSIS — Z923 Personal history of irradiation: Secondary | ICD-10-CM | POA: Insufficient documentation

## 2019-09-28 DIAGNOSIS — C01 Malignant neoplasm of base of tongue: Secondary | ICD-10-CM | POA: Diagnosis not present

## 2019-09-28 DIAGNOSIS — Z9221 Personal history of antineoplastic chemotherapy: Secondary | ICD-10-CM | POA: Insufficient documentation

## 2019-09-28 DIAGNOSIS — Z8673 Personal history of transient ischemic attack (TIA), and cerebral infarction without residual deficits: Secondary | ICD-10-CM | POA: Insufficient documentation

## 2019-09-28 DIAGNOSIS — Z85028 Personal history of other malignant neoplasm of stomach: Secondary | ICD-10-CM | POA: Diagnosis not present

## 2019-09-28 DIAGNOSIS — Z85810 Personal history of malignant neoplasm of tongue: Secondary | ICD-10-CM | POA: Insufficient documentation

## 2019-09-28 DIAGNOSIS — Z800 Family history of malignant neoplasm of digestive organs: Secondary | ICD-10-CM | POA: Insufficient documentation

## 2019-09-28 NOTE — Progress Notes (Signed)
No new changes noted today 

## 2019-09-29 ENCOUNTER — Telehealth: Payer: Self-pay

## 2019-09-29 NOTE — Telephone Encounter (Signed)
Orders has been faxed to specialty for a  US guided left neck mass. Fax conformation confirmed.

## 2019-10-03 ENCOUNTER — Telehealth: Payer: Self-pay

## 2019-10-03 NOTE — Telephone Encounter (Signed)
Spoke with Mr Kent [name] to inform him of his appointment time and Date. Appointment at 10 for 11am @ 10/05/2019. The patient was made aware.

## 2019-10-05 ENCOUNTER — Ambulatory Visit: Payer: Medicare Other

## 2019-10-05 ENCOUNTER — Other Ambulatory Visit: Payer: Self-pay | Admitting: Radiology

## 2019-10-07 ENCOUNTER — Ambulatory Visit
Admission: RE | Admit: 2019-10-07 | Discharge: 2019-10-07 | Disposition: A | Discharge status: Home / Self Care | Payer: Medicare Other | Source: Ambulatory Visit | Attending: Hematology and Oncology | Admitting: Hematology and Oncology

## 2019-10-07 ENCOUNTER — Other Ambulatory Visit: Payer: Self-pay

## 2019-10-07 ENCOUNTER — Ambulatory Visit: Payer: Medicare Other

## 2019-10-07 DIAGNOSIS — C01 Malignant neoplasm of base of tongue: Secondary | ICD-10-CM | POA: Insufficient documentation

## 2019-10-07 DIAGNOSIS — R221 Localized swelling, mass and lump, neck: Secondary | ICD-10-CM | POA: Insufficient documentation

## 2019-10-07 IMAGING — US US BIOPSY LYMPH NODE
1 series · 13 of 18 positions shown · non-contrast
Comparison: none

INDICATION: Head neck squamous cell carcinoma of the tongue base

[Series 1: us biopsy lymph node · 0.06mm/px · 13 of 18 slices shown]
[im 1/18]
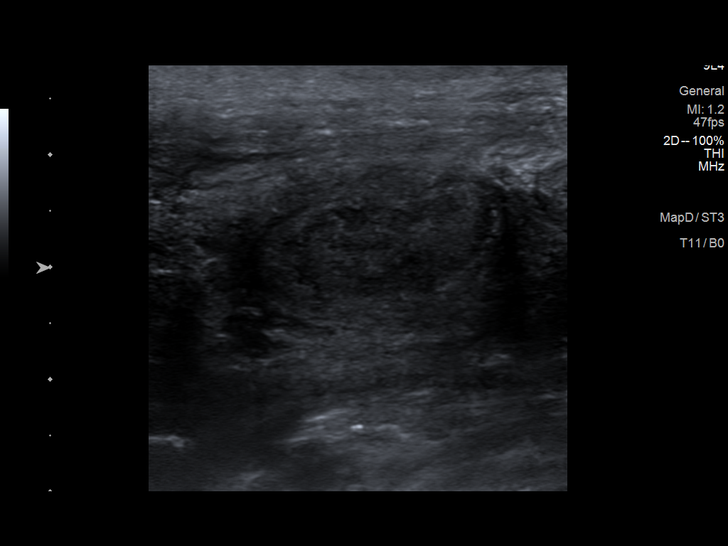
[im 3/18]
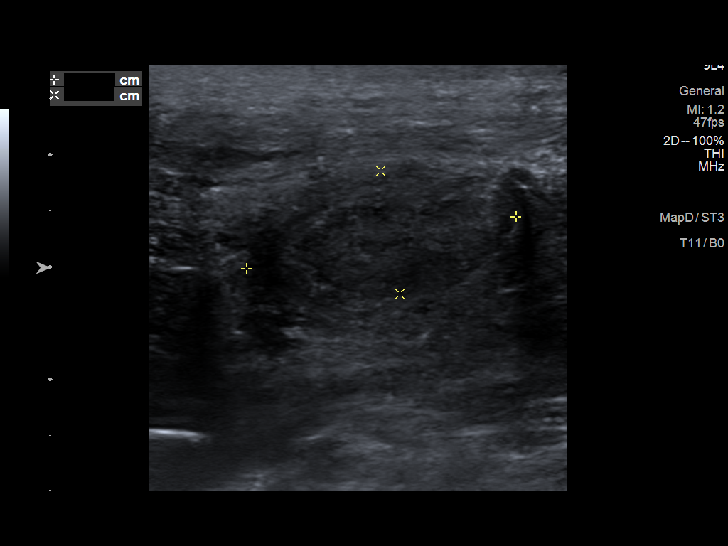
[im 4/18]
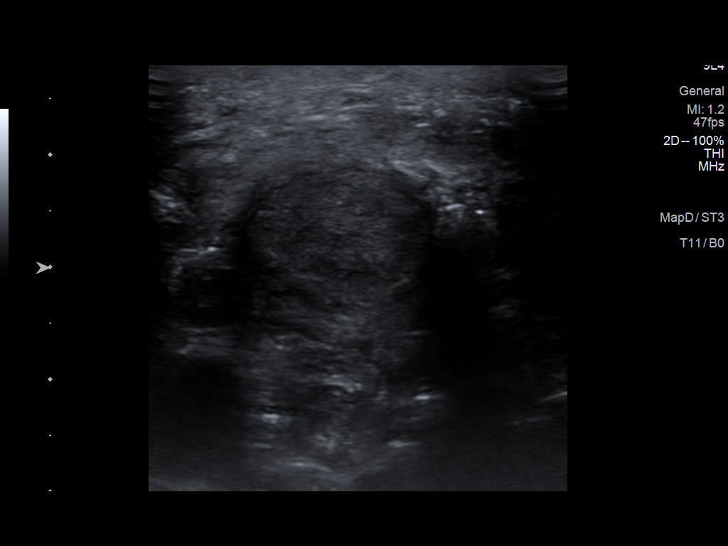
[im 5/18]
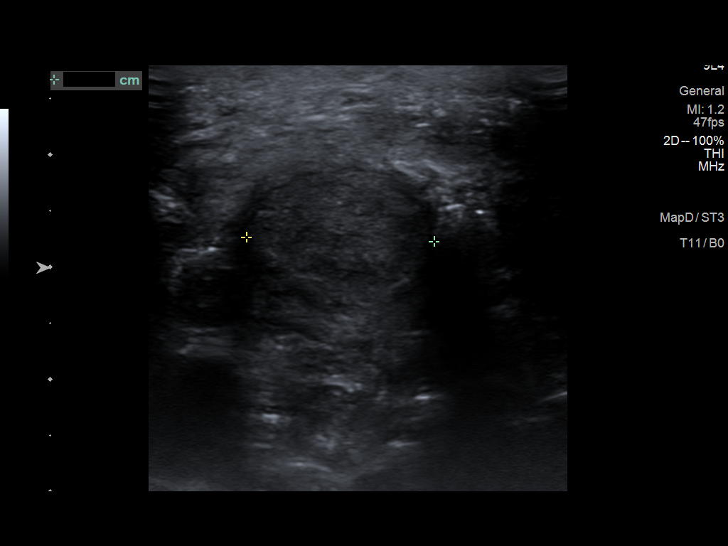
[im 7/18]
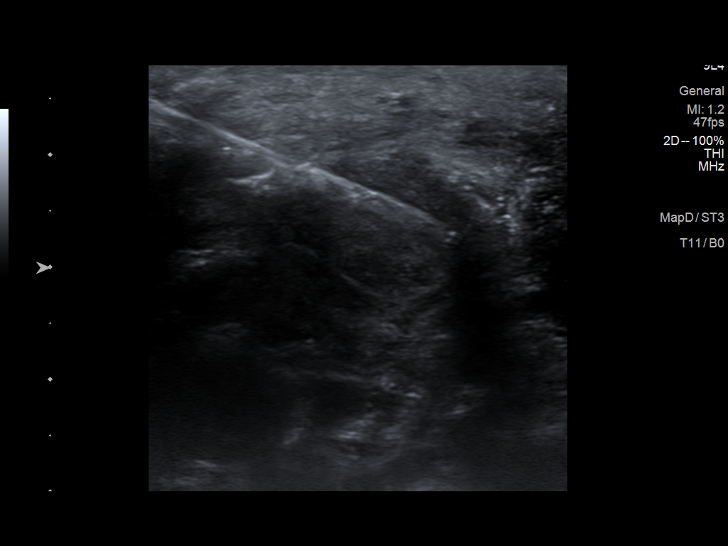
[im 8/18]
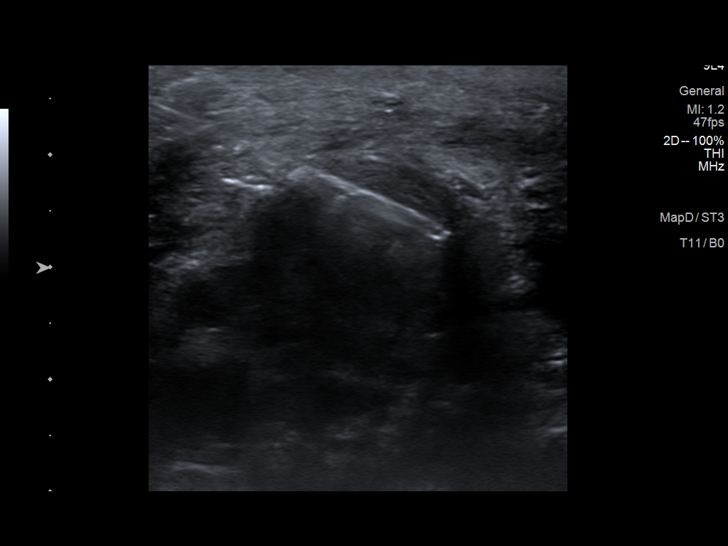
[im 10/18]
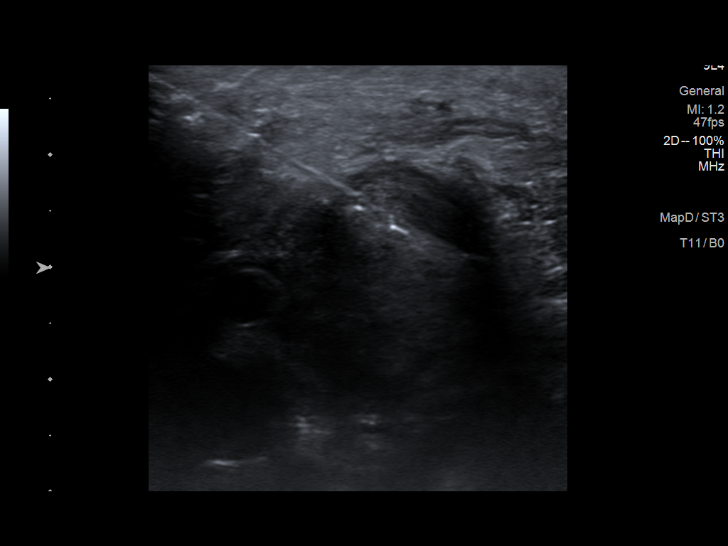
[im 11/18]
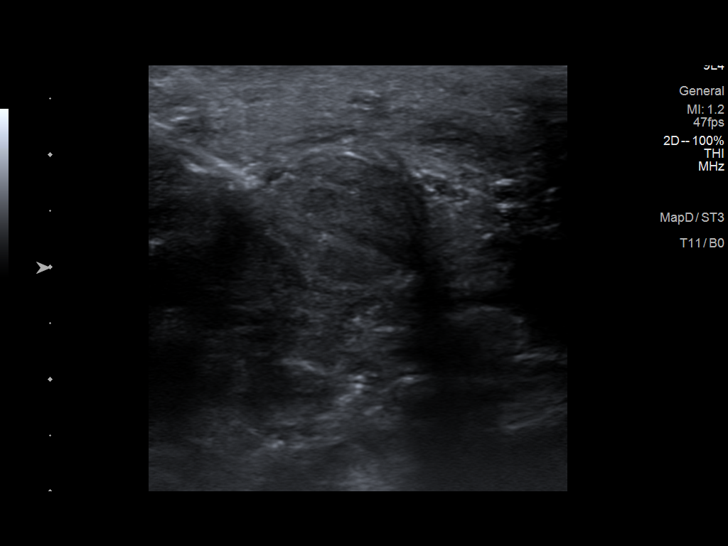
[im 12/18]
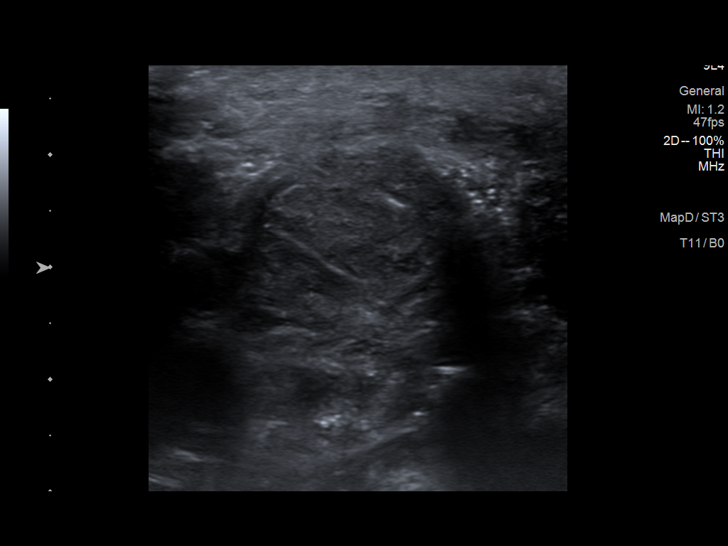
[im 14/18]
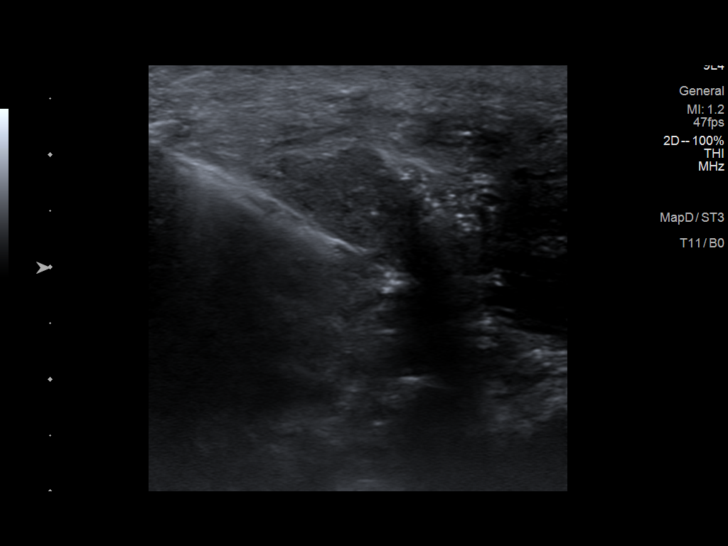
[im 15/18]
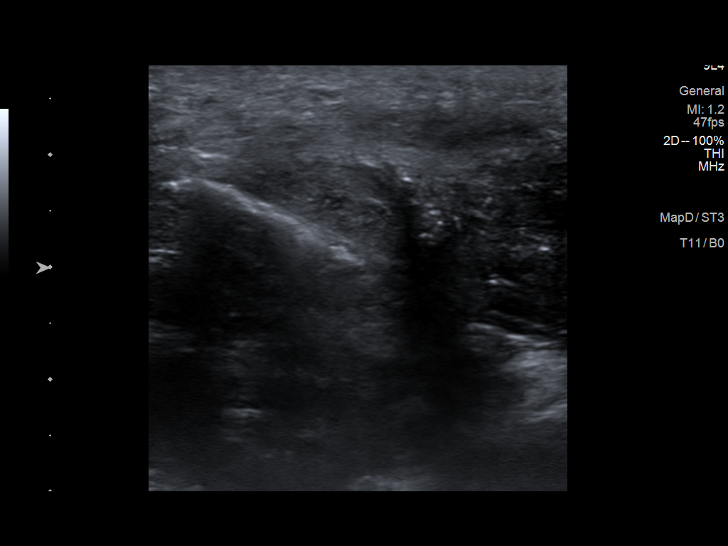
[im 16/18]
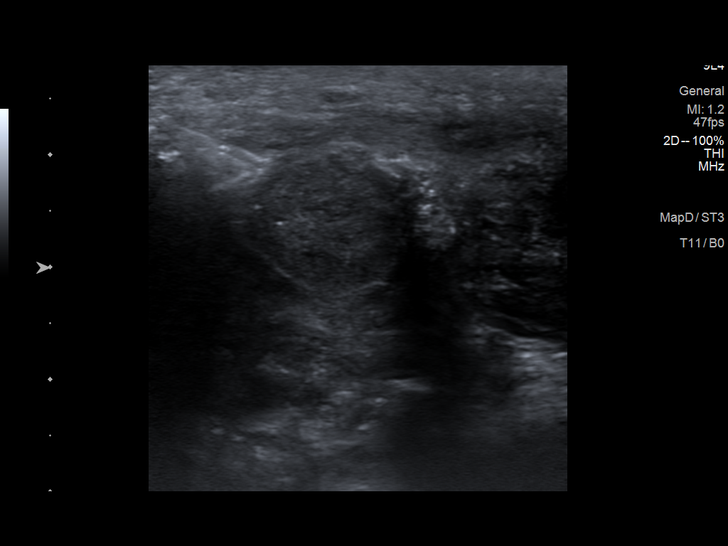
[im 18/18]
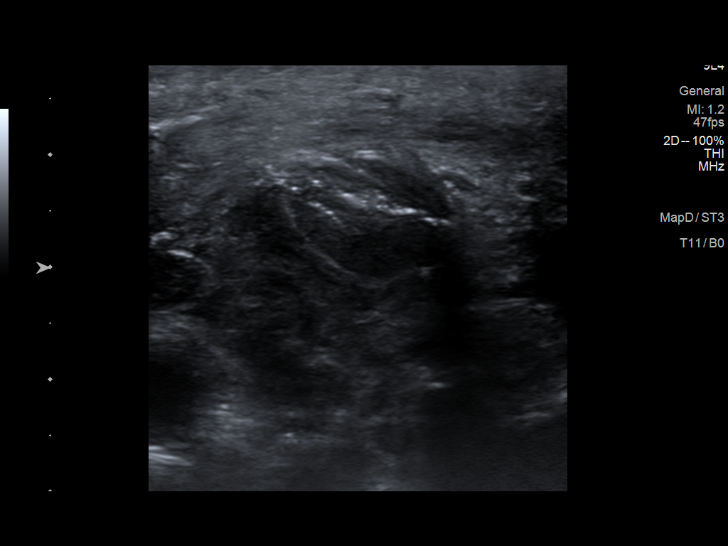

[13 of 18 positions shown; findings below may reference images not displayed]

EXAM:
ULTRASOUND GUIDED CORE BIOPSY OF LEFT NECK NODAL MASS

MEDICATIONS:
1% LIDOCAINE LOCAL

ANESTHESIA/SEDATION:
Moderate Sedation Time:  NONE.

The patient was continuously monitored during the procedure by the
interventional radiology nurse under my direct supervision.

FLUOROSCOPY TIME:  Fluoroscopy Time: NONE.

COMPLICATIONS:
None immediate.

PROCEDURE:
The procedure, risks, benefits, and alternatives were explained to
the patient. Questions regarding the procedure were encouraged and
answered. The patient understands and consents to the procedure.

Previous imaging reviewed. Preliminary ultrasound performed. The
left neck firm nodal mass was localized and marked. This was
correlated with the PET-CT.

Under sterile conditions and local anesthesia, an 18 gauge core
biopsy was advanced to the lesion. 18 gauge core biopsies were
attempted. Because of the firmness of the nodal mass following
radiation, 18 gauge core biopsies were fragmented. Needle was
exchanged for 16 gauge core biopsy. 4 16 gauge core biopsies
obtained. These were intact and non fragmented. All samples were
placed in formalin. Postprocedure imaging demonstrates no hemorrhage
or hematoma. Patient tolerated biopsy well.
FINDINGS: Imaging confirms needle placed in the left neck nodal mass for core
biopsy
IMPRESSION: Successful ultrasound left neck nodal mass 18 gauge and 16 gauge
core biopsies

## 2019-10-07 NOTE — Procedures (Signed)
Interventional Radiology Procedure Note  Procedure: [place] LEFT NECK MASS BX    Complications: None  Estimated Blood Loss:  MIN  Findings: 18 AND 16 G CORES     Tamera Punt, MD

## 2019-10-07 NOTE — Discharge Instructions (Signed)

## 2019-10-11 LAB — SURGICAL PATHOLOGY

## 2019-10-12 ENCOUNTER — Telehealth: Payer: Self-pay

## 2019-10-12 NOTE — Telephone Encounter (Signed)
Foundation One including PDL-1 has been faxed today. Fax conformation has been received.

## 2019-10-19 ENCOUNTER — Encounter: Payer: Self-pay | Admitting: Hematology and Oncology

## 2019-10-25 ENCOUNTER — Encounter: Payer: Self-pay | Admitting: Hematology and Oncology

## 2019-11-01 NOTE — Progress Notes (Signed)
[facility]  [address] [place], [place] [address] Phone: [phone]  Fax: [phone]   Clinic Day:  11/02/2019  Referring physician: Rutherford Limerick, PA  Chief Complaint: Kent [name] is a 64 y.o. 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  HPI: The patient was last seen in the medical oncology clinic on 09/28/2019. At that time, he felt "good." He was able to take care of all of his ADLs. He used a cane to ambulate.  He had numbness and tingling in his left cheek, arm, and leg s/p stroke. Left sided weakness had resolved.  Exam revealed a 7.5 x 8 cm fixed left SCM mass.  Pathology from left neck lymph node biopsy on 10/07/2019 revealed metastatic carcinoma consistent with squamous cell carcinoma. Cells were positive for p40 and p16. PD-L1 revealed CPS 1. Foundation One revealed no reportable alterations.  Microsatellite status was MS-stable, tumor mutational burden 1 Muts/Mb, BCL2L2 amplification, MET S213L.  During the interim, he has been "ok".  He is able to swallow without problems. His hearing is fine. The strength on his left side is "ok", but he still has some tingling and numbness. He states that his left leg gets stiff if he stays still for too long.   Past Medical History:  Diagnosis Date  . Arthritis   . Diabetes mellitus without complication ([place])   . Hyperlipidemia   . Hypertension   . Obesity   . Osteomyelitis ([place])    From pt chart  . Renal insufficiency    CKD noted on chart  . Staph infection 05/29/2016   Nov 2017. Staph infection in LT shoulder requiring debridement and grafts  . Status post debridement   . 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 Tongue cancer ([place])    Stage IV squamous cell carcinoma of the base of the tongue/left tonsil    Past Surgical History:  Procedure  Laterality Date  . EXCISION OF TONGUE LESION Left 06/19/2016   Procedure: EXCISION OF TONGUE LESION;  Surgeon: Margaretha Sheffield, MD;  Location: [place];  Service: ENT;  Laterality: Left;  diabetic - insulin and oral meds - not using either currently  . gastric tumor removed  2010  . I & D EXTREMITY Left 01/09/2016   Procedure: IRRIGATION AND DEBRIDEMENT EXTREMITY;  Surgeon: Robert Bellow, MD;  Location: [facility];  Service: General;  Laterality: Left;  . LARYNGOSCOPY N/A 06/19/2016   Procedure: LARYNGOSCOPY;  Surgeon: Margaretha Sheffield, MD;  Location: [facility];  Service: ENT;  Laterality: N/A;  . PERIPHERAL VASCULAR CATHETERIZATION Right 12/31/2015   Procedure: Lower Extremity Angiography;  Surgeon: Algernon Huxley, MD;  Location: [facility];  Service: Cardiovascular;  Laterality: Right;  . tongue mass biopsy      Family History  Problem Relation Age of Onset  . Heart disease Mother   . Hypertension Father   . Cancer Sister        stomach  . Heart disease Brother        MI  . Cancer Daughter   . Cancer Son        testicular  . ADD / ADHD Sister     Social History:  reports that he has never smoked. He has never used smokeless tobacco. He reports that he does not drink alcohol  and does not use drugs. He smokes a pipe and cigar rarely (once every 3 months).  He has a son and daughter. 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 He currently lives at the [facility] in [place], [place]. The patient is accompanied by his wife, [name].  Allergies: No Known Allergies  Current Medications: Current Outpatient Medications  Medication Sig Dispense Refill  . amLODipine (NORVASC) 10 MG tablet Take 10 mg by mouth daily.    Marland Kitchen aspirin EC 81 MG EC tablet Take 1 tablet (81 mg total) by mouth daily.    Marland Kitchen atorvastatin (LIPITOR) 40 MG tablet Take 1 tablet (40 mg total) by mouth daily.    . insulin detemir (LEVEMIR) 100 UNIT/ML injection Inject 0.08 mLs (8  Units total) into the skin daily. 10 mL 11  . LANTUS SOLOSTAR 100 UNIT/ML Solostar Pen Inject into the skin.    Marland Kitchen levothyroxine (SYNTHROID) 25 MCG tablet Take 25 mcg by mouth daily.    Marland Kitchen losartan (COZAAR) 50 MG tablet Take 50 mg by mouth daily.    Marland Kitchen AMLODIPINE BESYLATE PO Take 5 mg by mouth daily. (Patient not taking: Reported on 11/02/2019)    . polyethylene glycol (MIRALAX / GLYCOLAX) 17 g packet Take 17 g by mouth 2 (two) times daily. (Patient not taking: Reported on 11/02/2019) 14 each 0   No current facility-administered medications for this visit.    Review of Systems  Constitutional: Negative.  Negative for chills, diaphoresis, fever, malaise/fatigue and weight loss (up 3 lbs).  HENT: Negative for congestion, ear discharge, ear pain, hearing loss, nosebleeds, sinus pain, sore throat and tinnitus.        Swallowing is okay.  Eyes: Negative.  Negative for blurred vision and double vision.  Respiratory: Negative.  Negative for cough, hemoptysis, sputum production and shortness of breath.   Cardiovascular: Negative.  Negative for chest pain, palpitations and leg swelling.  Gastrointestinal: Negative.  Negative for abdominal pain, blood in stool, constipation, diarrhea, heartburn, melena, nausea and vomiting.  Genitourinary: Negative.  Negative for dysuria, flank pain, frequency, hematuria and urgency.  Musculoskeletal: Negative.  Negative for back pain, falls, joint pain, myalgias and neck pain.  Skin: Negative.  Negative for itching and rash.  Neurological: Positive for tingling (left arm and leg) and sensory change (numbness in left cheek, arm, and left leg s/p CVA). Negative for dizziness, tremors, speech change, focal weakness, weakness and headaches.       CVA on 06/22/2019  Endo/Heme/Allergies: Negative.  Does not bruise/bleed easily.       Diabetes  Psychiatric/Behavioral: Negative.  Negative for depression and memory loss. The patient is not nervous/anxious and does not have insomnia.    All other systems reviewed and are negative.    Performance status (ECOG):  1-2  Vitals Blood pressure (!) 134/97, pulse 69, temperature 97.6 F (36.4 C), temperature source Tympanic, resp. rate 18, weight 185 lb 3 oz (84 kg), SpO2 100 %.   Physical Exam Vitals and nursing note reviewed.  Constitutional:      General: He is not in acute distress.    Appearance: He is not diaphoretic.     Interventions: Face mask in place.     Comments: Cane by his side.  HENT:     Head: Normocephalic and atraumatic.     Comments: Short gray hair. Goatee.    Right Ear: Hearing normal.     Left Ear: Hearing normal.     Mouth/Throat:  Mouth: Mucous membranes are moist. No oral lesions.     Pharynx: Oropharynx is clear. No oropharyngeal exudate.     Comments: Poor dentition Eyes:     General: No scleral icterus.       Right eye: No discharge.        Left eye: No discharge.     Extraocular Movements: Extraocular movements intact.     Conjunctiva/sclera: Conjunctivae normal.     Pupils: Pupils are equal, round, and reactive to light.     Comments: Blue eyes.  Neck:     Vascular: No JVD.     Comments: Left SCM rock hard and fixed, stuck down (7 x 5.5 cm). Cardiovascular:     Rate and Rhythm: Normal rate and regular rhythm.     Heart sounds: Normal heart sounds. No murmur heard.  No friction rub. No gallop.   Pulmonary:     Effort: Pulmonary effort is normal.     Breath sounds: Normal breath sounds. No wheezing, rhonchi or rales.  Abdominal:     General: Bowel sounds are normal. There is no distension.     Palpations: Abdomen is soft. There is no hepatomegaly, splenomegaly or mass.     Tenderness: There is no abdominal tenderness. There is no guarding or rebound.  Musculoskeletal:        General: No swelling, tenderness or deformity. Normal range of motion.     Cervical back: Normal range of motion and neck supple.  Lymphadenopathy:     Head:     Right side of head: No preauricular,  posterior auricular or occipital adenopathy.     Left side of head: No preauricular, posterior auricular or occipital adenopathy.     Cervical: No cervical adenopathy.     Upper Body:     Right upper body: No supraclavicular or axillary adenopathy.     Left upper body: No supraclavicular or axillary adenopathy.     Lower Body: No right inguinal adenopathy. No left inguinal adenopathy.  Skin:    General: Skin is warm and dry.     Coloration: Skin is not pale.     Findings: No bruising, erythema, lesion or rash.  Neurological:     Mental Status: He is alert and oriented to person, place, and time.  Psychiatric:        Mood and Affect: Mood normal.        Behavior: Behavior normal.        Thought Content: Thought content normal.        Judgment: Judgment normal.    Admissions: [facility] from 11/15/2015 - 11/16/2015 for hyponatremia (sodium 122).  He received IVF overnight.   [facility] from 12/08/2015 - 12/09/2015 for hydration and electrolyte replacement. Sodium was 126.   [facility] from 12/09/2015 - 12/12/2015 secondary to nausea, vomiting, and minimal oral intake.  He received IVF with electrolyte replacement and anti-emetics.  A PICC line was placed for IVF [facility] from 12/28/2015 - 12/31/2015 with dehydration and hyponatremia.  He received IVF.  He was discovered to have dry gangrene of the right second toe.  Angiogram revealed good perfusion. [facility] from 01/08/2016 - 01/10/2016 with necrotizing fasciitis of the left shoulder.  Blood cultures grew staph aureus.  He was treated with vancomycin, Zosyn, and clindamycin. He  underwent debridement of the left shoulder and wound VAC placement on 01/09/2016.  Intra-operative wound cultures on 11/20/207 revealed MSSA, pan sensitive.   [facility] from 01/10/2016 - 01/22/2016 for wound care.  He underwent a second  debridement on 01/11/2016.  He underwent fasciocutaneous pectoral flap to the left shoulder with layered wound closure of the posterior shoulder and split thickness  skin graft to the pectoralis major on 01/17/2016.  Bone biopsy revealed ragged necrotic bone with associated acute inflammation c/w acute osteomyelitis.  TEE revealed an AV vegetation versus Lambl's excrescence.  He was transitioned to linezolid due to vancomycin shortage.  He also received clindamycin and Zosyn.  He was treated with at least 4 weeks of antibiotics for presumed septic arthritis. [facility] from 06/22/2019 - 07/05/2019 for an ischemic CVA.  Head MRI on 06/22/2019 showed acute/subacute infarcts in the right occipital, right temporal and right thalamic regions with some associated petechial hemorrhage. CTA on 06/24/2019 showed multiple areas of stenosis/occlusion including left vertebral origin moderate to severe, right PCA occlusion, left PCA severe, proximal right M2 moderate to severe.  Echo with bubble study was negative.   No visits with results within 3 Day(s) from this visit.  Latest known visit with results is:  Hospital Outpatient Visit on 10/07/2019  Component Date Value Ref Range Status  . SURGICAL PATHOLOGY 10/07/2019    Final-Edited                   Value:SURGICAL PATHOLOGY THIS IS AN ADDENDUM REPORT CASE: [id number] PATIENT: Kent [name] Surgical Pathology Report Addendum  Reason for Addendum #1:  Additional clinical/test information  Specimen Submitted: A. Neck mass, left; BIOPSY  Clinical History: In 2017 treated with chemo and radiation for squamous cell carcinoma, metastatic to left neck lymph nodes, clinically oropharyngeal primary (tongue base), p16 and HPV positive based on [facility] biopsy of left neck metastastasis. Now with recurrent left neck mass.  DIAGNOSIS: A. SOFT TISSUE MASS, LEFT NECK; ULTRASOUND-GUIDED CORE BIOPSY: - METASTATIC CARCINOMA CONSISTENT WITH SQUAMOUS CELL CARCINOMA.  Comment: The biopsy cores consist of sclerotic stroma containing scattered islands of viable neoplastic cells and extensive necrosis. At the ends of some cores there is  atrophic skeletal muscle. The tumor cells are small, with high nuclear/cytoplasmic ratio and distinct nucleoli. No sq  uamous differentiation is seen on routine sections. Immunohistochemistry (IHC) was performed for further characterization. The neoplastic cells are positive for p40 with diffuse strong nuclear staining. They are positive for p16 with diffuse strong nuclear and cytoplasmic staining.  The IHC profile supports the above diagnosis, and is the same as reported on a previous biopsy specimen from the left neck: [facility] Surgical Pathology Case: [id number] (09/10/2015). The [facility] specimen was also positive for high-risk HPV by in situ hybridization.   There are sufficient tumor cells for PD-L1 testing. The proportion of viable tumor cells is probably too low for successful molecular genetic testing.  A left neck mass fine needle aspirate performed at [facility] on 09/07/19 was reported as atypical, suspicious for squamous cell carcinoma based on a single small group of cells positive for p40.  IHC slides were prepared by [facility] for Molecular Biology and Pathology, RTP, . All con                         trols stained appropriately.  This test was developed and its performance characteristics determined by [facility]. It has not been cleared or approved by the [place] Food and Drug Administration. The FDA does not require this test to go through premarket FDA review. This test is used for clinical purposes. It should not be regarded as investigational or for research. This laboratory is certified under the Clinical Laboratory  Improvement Amendments (CLIA) as qualified to perform high complexity clinical laboratory testing.  GROSS DESCRIPTION: A. Labeled: Left neck mass Received: Formalin Number of needle core biopsy(s): 4 and multiple additional fragments Length: Range from 1.3 to 1.8 cm Diameter: 0.2 cm Description: Received are cores and fragments of tan-white soft tissue. The  additional fragments are 1.2 x 0.7 x 0.2 cm in aggregate. Ink: None Entirely submitted in cassettes 1-5 with 1 core per cassettes 1-4 and the remaining fragments in cassette 5.  Final Diagno                         sis performed by Bryan Lemma, MD.   Electronically signed 10/11/2019 3:32:35PM The electronic signature indicates that the named Attending Pathologist has evaluated the specimen Technical component performed at [facility], [address], [place], [place] [address] Lab: [phone] Dir: Rush Farmer, MD, MMM  Professional component performed at [facility], [facility], [place], [place], [place] [address] Lab: [phone] Dir: Dellia Nims. Rubinas, MD  ADDENDUM: The slides from the previous left upper neck lymph node biopsy were reviewed ([id number], 08/24/15). The neoplastic cells are similar in appearance, but in the previous sample there are foci of cells with eosinophilic cytoplasm suggesting squamous differentiation, not seen in the current sample. Both specimens show stromal sclerosis, but there is more lymphoplasmacytic inflammation in the previous sample.  Addendum #1 performed by Bryan Lemma, MD.   Electronically signed 10/11/2019 4:35:16PM                          The electronic signature indicates that the named Attending Pathologist has evaluated the specimen Technical component performed at [facility], [address], [place], [place] [address] Lab: [phone] Dir: Rush Farmer, MD, MMM  Professional component performed at [facility], [facility], [place], [place], [place] [address] Lab: [phone] Dir: Dellia Nims. Rubinas, MD    Assessment:  Kent [name] is a 64 y.o. 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  Tumor was p16 IHC positive (high risk HPV).  He received 3 cycles of cisplatin (10/22/2015 - 12/04/2015) with concurrent radiation.  Radiation completed on  12/21/2015.  CT soft tissue neck on 08/17/2015 revealed a slowly progressive over years, infiltrative, deep 4.1 x 5.4 cm LEFT parotid mass with widespread ipsilateral bulky adenopathy.  PET scan at [facility] on 09/17/2015 revealed left base of tongue malignancy corresponding to asymmetric enhancement identified on the neck CT with hypermetabolic extensive conglomerate left cervical adenopathy (level II-IV) extending from the level of the angle of the mandible down inferiorly to the level of the cricoid cartilage.  There was clustered subcentimeter left level IB submandibular nodes with mild FDG activity.  There was no evidence of metastatic disease.  PET scan on 05/29/2016 revealed asymmetric hypermetabolism posterior left hilum.  There was mild left cervical lymphadenopathy with hypermetabolism, qualitatively decreased when comparing back to 09/17/2015 exam.  There were tiny bilateral pulmonary nodules, too small to detect on PET imaging.   Laryngoscopy and biopsy of the left tongue base and left retromalar trigone on 06/19/2016 revealed no evidence of malignancy.  Biopsies of the left tongue base noted candidiasis with reactive atypia. Retromolar triangle revealed squamous mucosa with candidiasis, ulceration and changes due to radiation therapy.  Soft tissue neck CT with contrast on 06/24/2019 revealed a 7 cm ill-defined left neck mass, increased from 2018 and c/w  recurrent/progressive tumor.  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  There was patchy ground-glass opacities in both lung apices c/w COVID-19 pneumonia.  PET scan on 08/10/2019 revealed hypermetabolic left-sided cervical nodes, consistent with recurrent/metastatic disease and no evidence of hypermetabolic extracervical metastasis. There was a left sided level 2 nodal mass (3.1 x 4.1 cm) and a contiguous or immediately adjacent left level 3 node (2.1 x 2.4 cm).   Left neck lymph node biopsy on 10/07/2019 revealed metastatic carcinoma  consistent with squamous cell carcinoma. Cells were positive for p40 and p16. PD-L1 revealed CPS 1.  Foundation One revealed no reportable alterations.  Microsatellite status was MS-stable, tumor mutational burden 1 Muts/Mb, BCL2L2 amplification, MET S213L.  Alpha gal IgE was 1.21 (< 0.35) c/w IgE antibodies to galactose-alpha-1,3 galactose (oligosaccharide part on Fab portion of the cetuximab heavy chain) and suggestive of potential cetuximab reaction.  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  Pathologic stage was T4NxM0.  He took imatinib for 1 year.  He has a history of recurrent hyponatremia.   He was diagnosed with COVID-19 on 06/03/2019.   Symptomatically, he feels "ok".  He is able to swallow without problems. His hearing is fine. The strength on his left side is "ok", but he still has some tingling and numbness. Exam reveals a 7 x 5.5 cm rock hard fixed SCM mass.  Plan: 1.   Clinical stage IVB (T2N3M0) base of tongue squamous cell carcinoma  He received concurrent cisplatin and radiation (completed 12/2015).  Soft tissue neck CT on 06/24/2019 revealed an enlarging mass worrisome for recurrent disease.  Direct laryngoscopy on 07/19/2019 revealed no masses.  PET scan on 08/10/2019 revealed a conglomerate left neck adenopathy c/w recurrent disease.  Exam reveals a 7 x 5.5 cm fixed left sternocleidomastoid mass.  He is not a surgical candidate.  Lymph node biopsy on 10/07/2019 confirmed metastatic carcinoma c/w squamous cell carcinoma.   Foundation One- no reportable alterations.   PD-L1 status noted CPS 1.   Discuss treatment options:   Pembrolizumab, carboplatin, 5FU.   Pembrolizumab.  Discuss response rates and potential side effects on pembrolizumab, carboplatin and 5-FU.   Information provided.  Patient considering port-a-cath or PICC line/[place]. 2.    Patient to call back about treatment decision (pembrolizumab OR prmbrolizumab + 5FU + carboplatin). 3.   RTC for MD assessment, labs (CBC with diff, CMP, Mg, TSH), and initiation of therapy (needs to be on a Monday for all 3 drugs secondary to continuous infusion OR only 1 day if pembrolizumab).  I discussed the assessment and treatment plan with the patient.  The patient was provided an opportunity to ask questions and all were answered.  The patient agreed with the plan and demonstrated an understanding of the instructions.  The patient was advised to call back if the symptoms worsen or if the condition fails to improve as anticipated.  I provided 19 minutes of face-to-face time during this this encounter and > 50% was spent counseling as documented under my assessment and plan. An additional 10+ minutes were spent reviewing his chart (Epic and Care Everywhere) including notes, labs, and imaging studies.    Rosey Bath, MD, PhD    11/02/2019, 1:56 PM  I, Pietro Cassis, am acting as Neurosurgeon for [employer]. Merlene Pulling, MD, PhD.  I, Natiya Seelinger C. Merlene Pulling, MD, have reviewed the above documentation for accuracy  and completeness, and I agree with the above.

## 2019-11-02 ENCOUNTER — Other Ambulatory Visit: Payer: Self-pay

## 2019-11-02 ENCOUNTER — Inpatient Hospital Stay: Payer: Medicare Other | Attending: Hematology and Oncology | Admitting: Hematology and Oncology

## 2019-11-02 ENCOUNTER — Encounter: Payer: Self-pay | Admitting: Hematology and Oncology

## 2019-11-02 ENCOUNTER — Other Ambulatory Visit: Payer: Self-pay | Admitting: Hematology and Oncology

## 2019-11-02 VITALS — BP 134/97 | HR 69 | Temp 97.6°F | Resp 18 | Wt 185.2 lb

## 2019-11-02 DIAGNOSIS — Z923 Personal history of irradiation: Secondary | ICD-10-CM | POA: Insufficient documentation

## 2019-11-02 DIAGNOSIS — E669 Obesity, unspecified: Secondary | ICD-10-CM | POA: Insufficient documentation

## 2019-11-02 DIAGNOSIS — Z7189 Other specified counseling: Secondary | ICD-10-CM

## 2019-11-02 DIAGNOSIS — E1122 Type 2 diabetes mellitus with diabetic chronic kidney disease: Secondary | ICD-10-CM | POA: Insufficient documentation

## 2019-11-02 DIAGNOSIS — R221 Localized swelling, mass and lump, neck: Secondary | ICD-10-CM

## 2019-11-02 DIAGNOSIS — Z87891 Personal history of nicotine dependence: Secondary | ICD-10-CM | POA: Diagnosis not present

## 2019-11-02 DIAGNOSIS — Z809 Family history of malignant neoplasm, unspecified: Secondary | ICD-10-CM | POA: Diagnosis not present

## 2019-11-02 DIAGNOSIS — E785 Hyperlipidemia, unspecified: Secondary | ICD-10-CM | POA: Insufficient documentation

## 2019-11-02 DIAGNOSIS — Z85038 Personal history of other malignant neoplasm of large intestine: Secondary | ICD-10-CM | POA: Diagnosis not present

## 2019-11-02 DIAGNOSIS — Z8673 Personal history of transient ischemic attack (TIA), and cerebral infarction without residual deficits: Secondary | ICD-10-CM | POA: Insufficient documentation

## 2019-11-02 DIAGNOSIS — I129 Hypertensive chronic kidney disease with stage 1 through stage 4 chronic kidney disease, or unspecified chronic kidney disease: Secondary | ICD-10-CM | POA: Diagnosis not present

## 2019-11-02 DIAGNOSIS — R911 Solitary pulmonary nodule: Secondary | ICD-10-CM | POA: Insufficient documentation

## 2019-11-02 DIAGNOSIS — Z800 Family history of malignant neoplasm of digestive organs: Secondary | ICD-10-CM | POA: Diagnosis not present

## 2019-11-02 DIAGNOSIS — N189 Chronic kidney disease, unspecified: Secondary | ICD-10-CM | POA: Diagnosis not present

## 2019-11-02 DIAGNOSIS — Z794 Long term (current) use of insulin: Secondary | ICD-10-CM | POA: Diagnosis not present

## 2019-11-02 DIAGNOSIS — Z7982 Long term (current) use of aspirin: Secondary | ICD-10-CM | POA: Insufficient documentation

## 2019-11-02 DIAGNOSIS — M1990 Unspecified osteoarthritis, unspecified site: Secondary | ICD-10-CM | POA: Insufficient documentation

## 2019-11-02 DIAGNOSIS — C01 Malignant neoplasm of base of tongue: Secondary | ICD-10-CM | POA: Insufficient documentation

## 2019-11-02 DIAGNOSIS — Z9221 Personal history of antineoplastic chemotherapy: Secondary | ICD-10-CM | POA: Insufficient documentation

## 2019-11-02 DIAGNOSIS — R200 Anesthesia of skin: Secondary | ICD-10-CM | POA: Insufficient documentation

## 2019-11-02 NOTE — Patient Instructions (Signed)
Pembrolizumab injection What is this medicine? PEMBROLIZUMAB (pem broe liz ue mab) is a monoclonal antibody. It is used to treat certain types of cancer. This medicine may be used for other purposes; ask your health care provider or pharmacist if you have questions. COMMON BRAND NAME(S): Keytruda What should I tell my health care provider before I take this medicine? They need to know if you have any of these conditions:  diabetes  immune system problems  inflammatory bowel disease  liver disease  lung or breathing disease  lupus  received or scheduled to receive an organ transplant or a stem-cell transplant that uses donor stem cells  an unusual or allergic reaction to pembrolizumab, other medicines, foods, dyes, or preservatives  pregnant or trying to get pregnant  breast-feeding How should I use this medicine? This medicine is for infusion into a vein. It is given by a health care professional in a hospital or clinic setting. A special MedGuide will be given to you before each treatment. Be sure to read this information carefully each time. Talk to your pediatrician regarding the use of this medicine in children. While this drug may be prescribed for children as young as 6 months for selected conditions, precautions do apply. Overdosage: If you think you have taken too much of this medicine contact a poison control center or emergency room at once. NOTE: This medicine is only for you. Do not share this medicine with others. What if I miss a dose? It is important not to miss your dose. Call your doctor or health care professional if you are unable to keep an appointment. What may interact with this medicine? Interactions have not been studied. Give your health care provider a list of all the medicines, herbs, non-prescription drugs, or dietary supplements you use. Also tell them if you smoke, drink alcohol, or use illegal drugs. Some items may interact with your  medicine. This list may not describe all possible interactions. Give your health care provider a list of all the medicines, herbs, non-prescription drugs, or dietary supplements you use. Also tell them if you smoke, drink alcohol, or use illegal drugs. Some items may interact with your medicine. What should I watch for while using this medicine? Your condition will be monitored carefully while you are receiving this medicine. You may need blood work done while you are taking this medicine. Do not become pregnant while taking this medicine or for 4 months after stopping it. Women should inform their doctor if they wish to become pregnant or think they might be pregnant. There is a potential for serious side effects to an unborn child. Talk to your health care professional or pharmacist for more information. Do not breast-feed an infant while taking this medicine or for 4 months after the last dose. What side effects may I notice from receiving this medicine? Side effects that you should report to your doctor or health care professional as soon as possible:  allergic reactions like skin rash, itching or hives, swelling of the face, lips, or tongue  bloody or black, tarry  breathing problems  changes in vision  chest pain  chills  confusion  constipation  cough  diarrhea  dizziness or feeling faint or lightheaded  fast or irregular heartbeat  fever  flushing  joint pain  low blood counts - this medicine may decrease the number of white blood cells, red blood cells and platelets. You may be at increased risk for infections and bleeding.  muscle pain  muscle weakness  pain, tingling, numbness in the hands or feet  persistent headache  redness, blistering, peeling or loosening of the skin, including inside the mouth  signs and symptoms of high blood sugar such as dizziness; dry mouth; dry skin; fruity breath; nausea; stomach pain; increased hunger or thirst; increased  urination  signs and symptoms of kidney injury like trouble passing urine or change in the amount of urine  signs and symptoms of liver injury like dark urine, light-colored stools, loss of appetite, nausea, right upper belly pain, yellowing of the eyes or skin  sweating  swollen lymph nodes  weight loss Side effects that usually do not require medical attention (report to your doctor or health care professional if they continue or are bothersome):  decreased appetite  hair loss  muscle pain  tiredness This list may not describe all possible side effects. Call your doctor for medical advice about side effects. You may report side effects to FDA at 1-800-FDA-1088. Where should I keep my medicine? This drug is given in a hospital or clinic and will not be stored at home. NOTE: This sheet is a summary. It may not cover all possible information. If you have questions about this medicine, talk to your doctor, pharmacist, or health care provider.  2020 Elsevier/Gold Standard (2018-12-24 18:07:58)   Carboplatin injection What is this medicine? CARBOPLATIN (KAR boe pla tin) is a chemotherapy drug. It targets fast dividing cells, like cancer cells, and causes these cells to die. This medicine is used to treat ovarian cancer and many other cancers. This medicine may be used for other purposes; ask your health care provider or pharmacist if you have questions. COMMON BRAND NAME(S): Paraplatin What should I tell my health care provider before I take this medicine? They need to know if you have any of these conditions:  blood disorders  hearing problems  kidney disease  recent or ongoing radiation therapy  an unusual or allergic reaction to carboplatin, cisplatin, other chemotherapy, other medicines, foods, dyes, or preservatives  pregnant or trying to get pregnant  breast-feeding How should I use this medicine? This drug is usually given as an infusion into a vein. It is  administered in a hospital or clinic by a specially trained health care professional. Talk to your pediatrician regarding the use of this medicine in children. Special care may be needed. Overdosage: If you think you have taken too much of this medicine contact a poison control center or emergency room at once. NOTE: This medicine is only for you. Do not share this medicine with others. What if I miss a dose? It is important not to miss a dose. Call your doctor or health care professional if you are unable to keep an appointment. What may interact with this medicine?  medicines for seizures  medicines to increase blood counts like filgrastim, pegfilgrastim, sargramostim  some antibiotics like amikacin, gentamicin, neomycin, streptomycin, tobramycin  vaccines Talk to your doctor or health care professional before taking any of these medicines:  acetaminophen  aspirin  ibuprofen  ketoprofen  naproxen This list may not describe all possible interactions. Give your health care provider a list of all the medicines, herbs, non-prescription drugs, or dietary supplements you use. Also tell them if you smoke, drink alcohol, or use illegal drugs. Some items may interact with your medicine. What should I watch for while using this medicine? Your condition will be monitored carefully while you are receiving this medicine. You will need important blood work done  while you are taking this medicine. This drug may make you feel generally unwell. This is not uncommon, as chemotherapy can affect healthy cells as well as cancer cells. Report any side effects. Continue your course of treatment even though you feel ill unless your doctor tells you to stop. In some cases, you may be given additional medicines to help with side effects. Follow all directions for their use. Call your doctor or health care professional for advice if you get a fever, chills or sore throat, or other symptoms of a cold or flu. Do  not treat yourself. This drug decreases your body's ability to fight infections. Try to avoid being around people who are sick. This medicine may increase your risk to bruise or bleed. Call your doctor or health care professional if you notice any unusual bleeding. Be careful brushing and flossing your teeth or using a toothpick because you may get an infection or bleed more easily. If you have any dental work done, tell your dentist you are receiving this medicine. Avoid taking products that contain aspirin, acetaminophen, ibuprofen, naproxen, or ketoprofen unless instructed by your doctor. These medicines may hide a fever. Do not become pregnant while taking this medicine. Women should inform their doctor if they wish to become pregnant or think they might be pregnant. There is a potential for serious side effects to an unborn child. Talk to your health care professional or pharmacist for more information. Do not breast-feed an infant while taking this medicine. What side effects may I notice from receiving this medicine? Side effects that you should report to your doctor or health care professional as soon as possible:  allergic reactions like skin rash, itching or hives, swelling of the face, lips, or tongue  signs of infection - fever or chills, cough, sore throat, pain or difficulty passing urine  signs of decreased platelets or bleeding - bruising, pinpoint red spots on the skin, black, tarry stools, nosebleeds  signs of decreased red blood cells - unusually weak or tired, fainting spells, lightheadedness  breathing problems  changes in hearing  changes in vision  chest pain  high blood pressure  low blood counts - This drug may decrease the number of white blood cells, red blood cells and platelets. You may be at increased risk for infections and bleeding.  nausea and vomiting  pain, swelling, redness or irritation at the injection site  pain, tingling, numbness in the hands  or feet  problems with balance, talking, walking  trouble passing urine or change in the amount of urine Side effects that usually do not require medical attention (report to your doctor or health care professional if they continue or are bothersome):  hair loss  loss of appetite  metallic taste in the mouth or changes in taste This list may not describe all possible side effects. Call your doctor for medical advice about side effects. You may report side effects to FDA at 1-800-FDA-1088. Where should I keep my medicine? This drug is given in a hospital or clinic and will not be stored at home. NOTE: This sheet is a summary. It may not cover all possible information. If you have questions about this medicine, talk to your doctor, pharmacist, or health care provider.  2020 Elsevier/Gold Standard (2007-05-25 14:38:05)   Fluorouracil, 5-FU injection What is this medicine? FLUOROURACIL, 5-FU (flure oh YOOR a sil) is a chemotherapy drug. It slows the growth of cancer cells. This medicine is used to treat many types of cancer  like breast cancer, colon or rectal cancer, pancreatic cancer, and stomach cancer. This medicine may be used for other purposes; ask your health care provider or pharmacist if you have questions. COMMON BRAND NAME(S): Adrucil What should I tell my health care provider before I take this medicine? They need to know if you have any of these conditions:  blood disorders  dihydropyrimidine dehydrogenase (DPD) deficiency  infection (especially a virus infection such as chickenpox, cold sores, or herpes)  kidney disease  liver disease  malnourished, poor nutrition  recent or ongoing radiation therapy  an unusual or allergic reaction to fluorouracil, other chemotherapy, other medicines, foods, dyes, or preservatives  pregnant or trying to get pregnant  breast-feeding How should I use this medicine? This drug is given as an infusion or injection into a vein. It  is administered in a hospital or clinic by a specially trained health care professional. Talk to your pediatrician regarding the use of this medicine in children. Special care may be needed. Overdosage: If you think you have taken too much of this medicine contact a poison control center or emergency room at once. NOTE: This medicine is only for you. Do not share this medicine with others. What if I miss a dose? It is important not to miss your dose. Call your doctor or health care professional if you are unable to keep an appointment. What may interact with this medicine?  allopurinol  cimetidine  dapsone  digoxin  hydroxyurea  leucovorin  levamisole  medicines for seizures like ethotoin, fosphenytoin, phenytoin  medicines to increase blood counts like filgrastim, pegfilgrastim, sargramostim  medicines that treat or prevent blood clots like warfarin, enoxaparin, and dalteparin  methotrexate  metronidazole  pyrimethamine  some other chemotherapy drugs like busulfan, cisplatin, estramustine, vinblastine  trimethoprim  trimetrexate  vaccines Talk to your doctor or health care professional before taking any of these medicines:  acetaminophen  aspirin  ibuprofen  ketoprofen  naproxen This list may not describe all possible interactions. Give your health care provider a list of all the medicines, herbs, non-prescription drugs, or dietary supplements you use. Also tell them if you smoke, drink alcohol, or use illegal drugs. Some items may interact with your medicine. What should I watch for while using this medicine? Visit your doctor for checks on your progress. This drug may make you feel generally unwell. This is not uncommon, as chemotherapy can affect healthy cells as well as cancer cells. Report any side effects. Continue your course of treatment even though you feel ill unless your doctor tells you to stop. In some cases, you may be given additional medicines  to help with side effects. Follow all directions for their use. Call your doctor or health care professional for advice if you get a fever, chills or sore throat, or other symptoms of a cold or flu. Do not treat yourself. This drug decreases your body's ability to fight infections. Try to avoid being around people who are sick. This medicine may increase your risk to bruise or bleed. Call your doctor or health care professional if you notice any unusual bleeding. Be careful brushing and flossing your teeth or using a toothpick because you may get an infection or bleed more easily. If you have any dental work done, tell your dentist you are receiving this medicine. Avoid taking products that contain aspirin, acetaminophen, ibuprofen, naproxen, or ketoprofen unless instructed by your doctor. These medicines may hide a fever. Do not become pregnant while taking this medicine. Women  should inform their doctor if they wish to become pregnant or think they might be pregnant. There is a potential for serious side effects to an unborn child. Talk to your health care professional or pharmacist for more information. Do not breast-feed an infant while taking this medicine. Men should inform their doctor if they wish to father a child. This medicine may lower sperm counts. Do not treat diarrhea with over the counter products. Contact your doctor if you have diarrhea that lasts more than 2 days or if it is severe and watery. This medicine can make you more sensitive to the sun. Keep out of the sun. If you cannot avoid being in the sun, wear protective clothing and use sunscreen. Do not use sun lamps or tanning beds/booths. What side effects may I notice from receiving this medicine? Side effects that you should report to your doctor or health care professional as soon as possible:  allergic reactions like skin rash, itching or hives, swelling of the face, lips, or tongue  low blood counts - this medicine may  decrease the number of white blood cells, red blood cells and platelets. You may be at increased risk for infections and bleeding.  signs of infection - fever or chills, cough, sore throat, pain or difficulty passing urine  signs of decreased platelets or bleeding - bruising, pinpoint red spots on the skin, black, tarry stools, blood in the urine  signs of decreased red blood cells - unusually weak or tired, fainting spells, lightheadedness  breathing problems  changes in vision  chest pain  mouth sores  nausea and vomiting  pain, swelling, redness at site where injected  pain, tingling, numbness in the hands or feet  redness, swelling, or sores on hands or feet  stomach pain  unusual bleeding Side effects that usually do not require medical attention (report to your doctor or health care professional if they continue or are bothersome):  changes in finger or toe nails  diarrhea  dry or itchy skin  hair loss  headache  loss of appetite  sensitivity of eyes to the light  stomach upset  unusually teary eyes This list may not describe all possible side effects. Call your doctor for medical advice about side effects. You may report side effects to FDA at 1-800-FDA-1088. Where should I keep my medicine? This drug is given in a hospital or clinic and will not be stored at home. NOTE: This sheet is a summary. It may not cover all possible information. If you have questions about this medicine, talk to your doctor, pharmacist, or health care provider.  2020 Elsevier/Gold Standard (2007-06-23 13:53:16)

## 2019-11-09 ENCOUNTER — Telehealth: Payer: Self-pay

## 2019-11-09 NOTE — Telephone Encounter (Signed)
Left a message to inform Kent [name],Per Dr Mike Gip she wanted to see if he has made up his mind for treatment. I will put the patient on the f/u list so that Traci Sermon can f/u with this patient again tomorrow.

## 2019-11-10 ENCOUNTER — Telehealth: Payer: Self-pay | Admitting: *Deleted

## 2019-11-10 NOTE — Telephone Encounter (Signed)
Tried to call patient regarding treatment decision; no answer at home phone. Tried to reach patient's wife via her cell phone; went to answering machine. Left message to have the patient call us back on Monday.

## 2019-11-10 NOTE — Telephone Encounter (Signed)
Tried to reach patient at home number, no answer, no answering machine. Will try alternate number.

## 2020-12-11 ENCOUNTER — Other Ambulatory Visit: Payer: Self-pay | Admitting: Physician Assistant

## 2020-12-11 DIAGNOSIS — R42 Dizziness and giddiness: Secondary | ICD-10-CM

## 2020-12-11 DIAGNOSIS — R519 Headache, unspecified: Secondary | ICD-10-CM

## 2020-12-12 ENCOUNTER — Ambulatory Visit
Admission: RE | Admit: 2020-12-12 | Discharge: 2020-12-12 | Disposition: A | Discharge status: Home / Self Care | Payer: Medicare Other | Source: Ambulatory Visit | Attending: Physician Assistant | Admitting: Physician Assistant

## 2020-12-12 ENCOUNTER — Other Ambulatory Visit: Payer: Self-pay

## 2020-12-12 DIAGNOSIS — R42 Dizziness and giddiness: Secondary | ICD-10-CM | POA: Insufficient documentation

## 2020-12-12 DIAGNOSIS — R519 Headache, unspecified: Secondary | ICD-10-CM | POA: Diagnosis not present

## 2020-12-12 IMAGING — MR MR HEAD WO/W CM
14 series · 48 of 48 positions shown · IV contrast (gadavist)
Comparison: Head CT [DATE]

CLINICAL DATA: History of tongue cancer treated with radiation.
Dizziness and headache since a stroke last year.

EXAM:
MRI HEAD WITHOUT AND WITH CONTRAST
TECHNIQUE: Multiplanar, multiecho pulse sequences of the brain and surrounding
structures were obtained without and with intravenous contrast.
CONTRAST:  8mL GADAVIST GADOBUTROL 1 MMOL/ML IV SOLN

[Series 5: ax dwi_tracew · axial · 3.0mm · 0.65mm/px · z∈[-81,+72]mm · 2 of 48 slices shown]
[im 1/48]
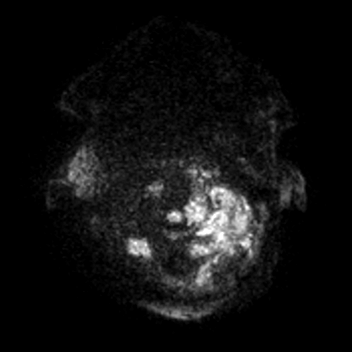
[im 48/48]
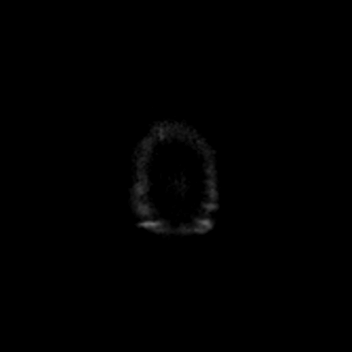

[Series 6: ax dwi_adc · axial · 3.0mm · 0.65mm/px · z∈[-81,+72]mm · 2 of 48 slices shown]
[im 1/48]
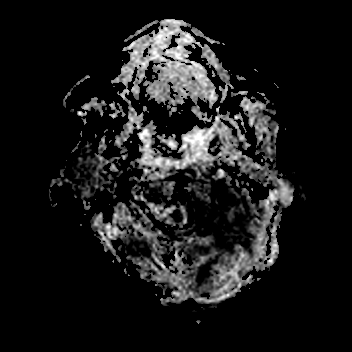
[im 48/48]
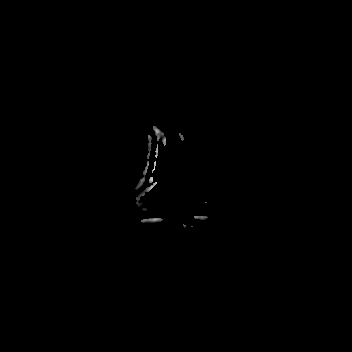

[Series 7: cor dwi_tracew · coronal · 5.0mm · 0.65mm/px · 3 of 40 slices shown]
[im 1/40]
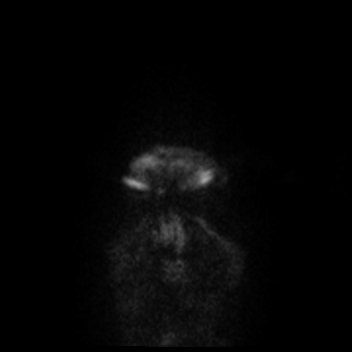
[im 20/40]
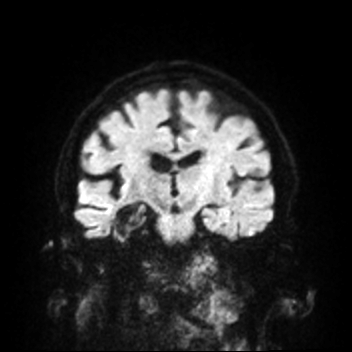
[im 40/40]
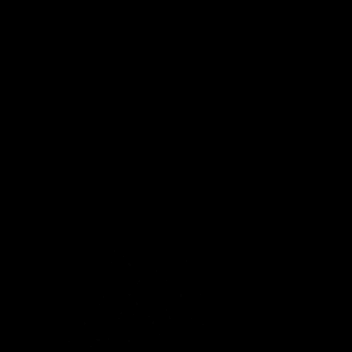

[Series 8: cor dwi_adc · coronal · 5.0mm · 0.65mm/px · 3 of 38 slices shown]
[im 1/38]
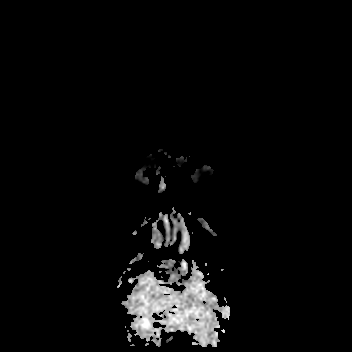
[im 19/38]
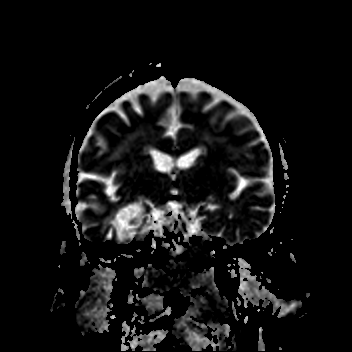
[im 38/38]
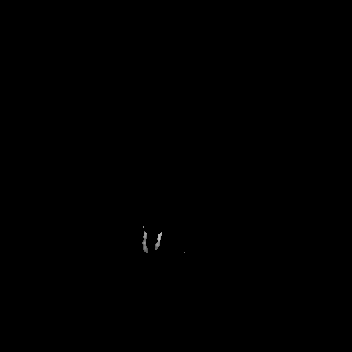

[Series 15: T1 · axial · 5.0mm · 0.90mm/px · z∈[-123,+27]mm · 2 of 27 slices shown (1 of 2)]
[im 1/27]
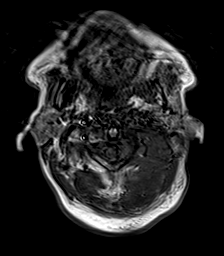
[im 27/27]
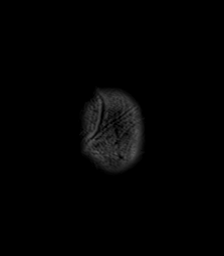

[Series 16: FLAIR · axial · 3.0mm · 0.69mm/px · z∈[-127,+29]mm · 4 of 55 slices shown]
[im 1/55]
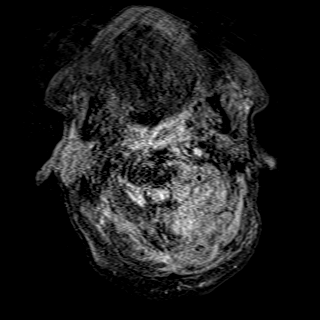
[im 19/55]
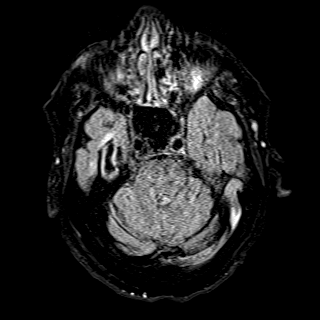
[im 37/55]
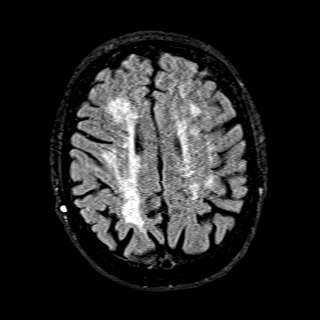
[im 55/55]
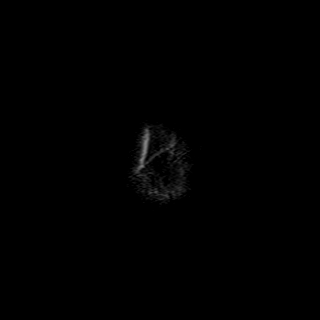

[Series 17: mag_images · axial · 3.0mm · 0.90mm/px · z∈[-122,+25]mm · 4 of 52 slices shown]
[im 1/52]
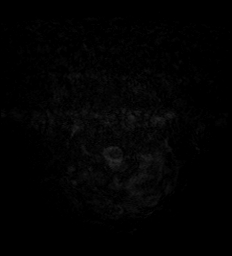
[im 18/52]
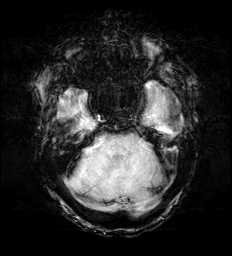
[im 35/52]
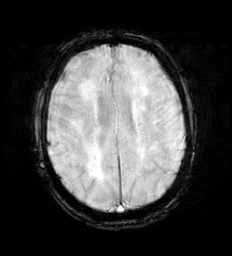
[im 52/52]
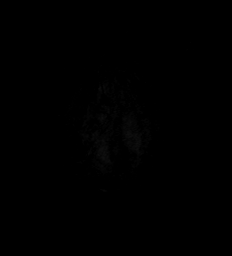

[Series 18: pha_images · axial · 3.0mm · 0.90mm/px · z∈[-120,+25]mm · 4 of 51 slices shown]
[im 1/51]
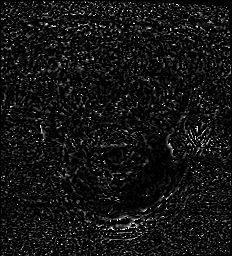
[im 17/51]
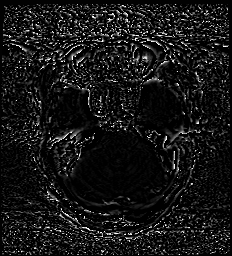
[im 34/51]
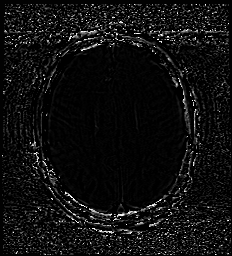
[im 51/51]
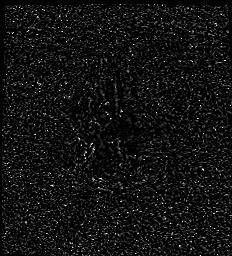

[Series 19: swi_images · axial · 3.0mm · 0.90mm/px · z∈[-122,+25]mm · 4 of 52 slices shown]
[im 1/52]
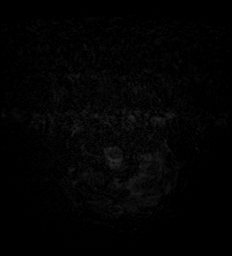
[im 18/52]
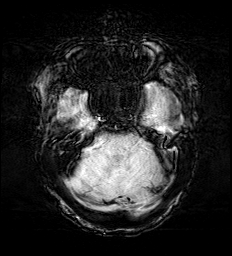
[im 35/52]
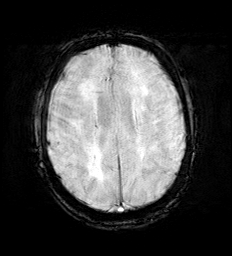
[im 52/52]
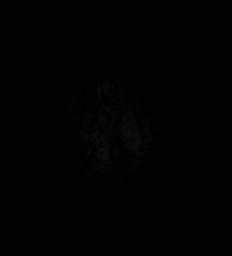

[Series 21: T1 · sagittal · 5.0mm · 0.94mm/px · 2 of 23 slices shown (2 of 2)]
[im 1/23]
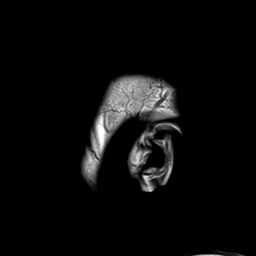
[im 23/23]
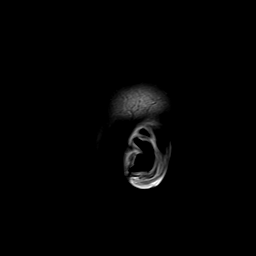

[Series 22: T2 · coronal · 5.0mm · 0.45mm/px · 2 of 31 slices shown]
[im 1/31]
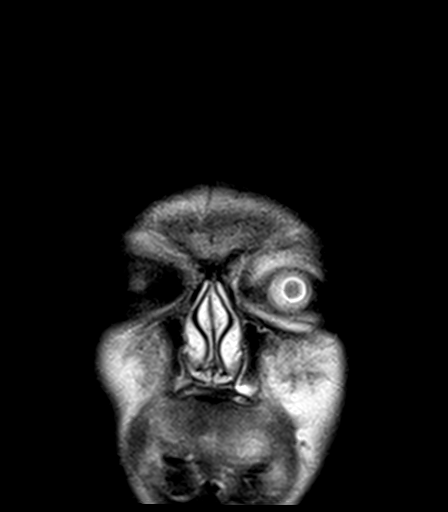
[im 31/31]
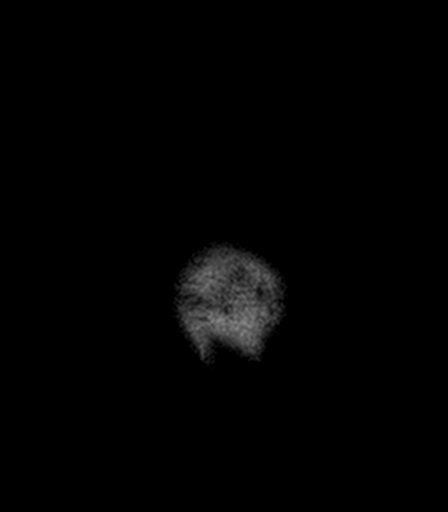

[Series 27: T1 post-contrast · axial · 1.0mm · 0.98mm/px · z∈[-108,+62]mm · 12 of 176 slices shown (1 of 2)]
[im 1/176]
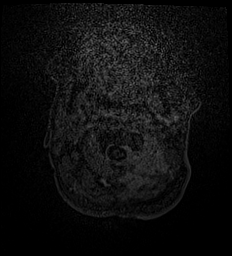
[im 16/176]
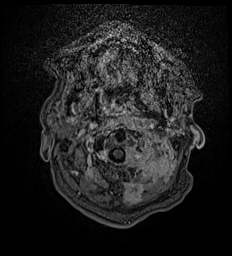
[im 32/176]
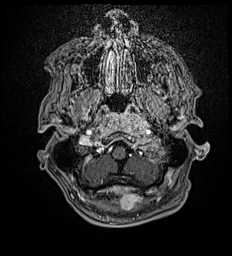
[im 48/176]
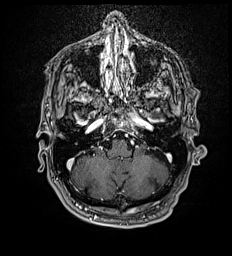
[im 64/176]
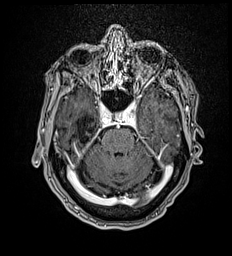
[im 80/176]
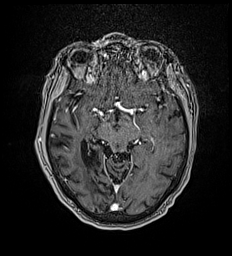
[im 96/176]
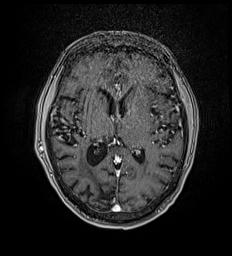
[im 112/176]
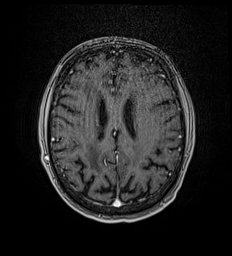
[im 128/176]
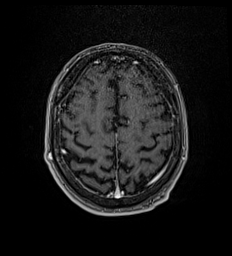
[im 144/176]
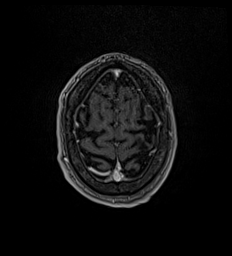
[im 160/176]
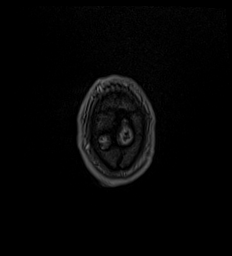
[im 176/176]
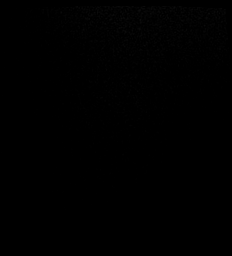

[Series 28: T1 post-contrast · coronal · 5.0mm · 0.90mm/px · 2 of 31 slices shown (2 of 2)]
[im 1/31]
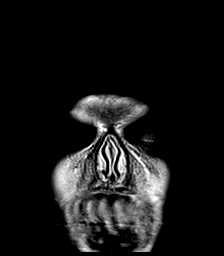
[im 31/31]
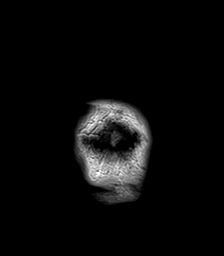

[Series 29: sag (id) · sagittal · 5.0mm · 0.94mm/px · 2 of 23 slices shown]
[im 1/23]
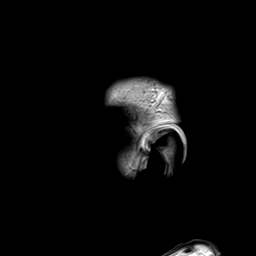
[im 23/23]
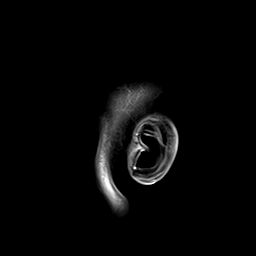

[48 of 48 positions shown; findings below may reference images not displayed]

FINDINGS: Brain: No evidence of hematogenous metastasis or acute infarct.
Small area of restricted diffusion at the parasagittal right
occipital cortex is in an area of remote infarction.

Remote right PCA territory infarct affecting occipital and medial
temporal lobes. Associated hemosiderin staining. Chronic small
vessel disease. Small remote high left posterior frontal cortex
infarct.

No hydrocephalus, collection, or acute hemorrhage.

Vascular: Slow flow in the enhancing/patent left transverse and
sigmoid dural sinuses due to IJ compression in the upper left neck.
The left V3 segment is encased by tumor but still flowing.

Skull and upper cervical spine: Tumor invasion in the left skull
base at the level of the lateral clivus, petrous apex, and left C1
ring. This is presumably direct spread of the patient's cervical
adenopathy seen on prior scan. Soft tissue masses are present within
the deep muscular compartment of the left more than right posterior
neck and in the prevertebral space eccentric to the left fossa of
Rosenmuller. The left hypoglossal canal and jugular foramen are
infiltrated. The left epidural canal is infiltrated at C1 and C2
without cord compression.

Sinuses/Orbits: Left mastoid opacification attributed to distal
eustachian tube obstruction by tumor.

These results will be called to the ordering clinician or
representative by the Radiologist Assistant, and communication
documented in the PACS or [REDACTED].
IMPRESSION: 1. Extensive metastatic disease in the left more than right
paravertebral neck with left C1 and skull base invasion. The left
epidural canal is infiltrated at C1 and C2 without cord compression.
2. Chronic ischemic injury including remote right PCA territory
infarct.

## 2020-12-12 MED ORDER — GADOBUTROL 1 MMOL/ML IV SOLN
8.0000 mL | Freq: Once | INTRAVENOUS | Status: AC | PRN
  Administered 2020-12-12: 8 mL via INTRAVENOUS

## 2020-12-13 ENCOUNTER — Telehealth: Payer: Self-pay | Admitting: Oncology

## 2020-12-13 ENCOUNTER — Telehealth: Payer: Self-pay | Admitting: *Deleted

## 2020-12-13 NOTE — Telephone Encounter (Signed)
Incoming call from [facility] [place] reporting that that did an MRI on this patient and he has metastatic disease. She is asking if we can see him and that it has been over a year since patient was last seen by [place]. She said to please call wife Jeannene Patella (wife) with the appointment.  IMPRESSION: 1. 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 The left epidural canal is infiltrated at C1 and C2 without cord compression. 2. 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     Electronically Signed   By: Jorje Guild M.D.   On: 12/12/2020 10:00

## 2020-12-13 NOTE — Telephone Encounter (Signed)
Returned call from patient's wife. She stated that she cannot bring patient into appointment on 10/17 and the soonest they could come would be 11/14. Appointment scheduled and confirmed. [name] stated their son is going through cancer, her mother in law is having surgery and there are multiple other pressing issues in their family.

## 2020-12-13 NOTE — Telephone Encounter (Signed)
Patient scheduled for Monday. Left VM with Olin Hauser and also tried patient's Number.

## 2020-12-13 NOTE — Telephone Encounter (Signed)
Attempt made to reach patient on home and mobile phone to make him aware of appointment on 10/17. No answer and not able to leave a message. Will try patient again later today.

## 2020-12-17 ENCOUNTER — Inpatient Hospital Stay: Payer: Medicare Other | Admitting: Oncology

## 2021-01-14 ENCOUNTER — Inpatient Hospital Stay: Payer: Medicare Other | Attending: Oncology | Admitting: Oncology

## 2021-01-29 ENCOUNTER — Telehealth: Payer: Self-pay | Admitting: Oncology

## 2021-01-29 NOTE — Telephone Encounter (Signed)
Pt called to set up appt. Please give a call back at [phone]

## 2021-02-12 ENCOUNTER — Inpatient Hospital Stay: Payer: Medicare Other | Attending: Nurse Practitioner | Admitting: Oncology

## 2021-02-12 ENCOUNTER — Other Ambulatory Visit: Payer: Self-pay

## 2021-02-12 ENCOUNTER — Encounter: Payer: Self-pay | Admitting: Oncology

## 2021-02-12 VITALS — BP 126/82 | HR 97 | Temp 98.9°F | Wt 163.4 lb

## 2021-02-12 DIAGNOSIS — E669 Obesity, unspecified: Secondary | ICD-10-CM | POA: Insufficient documentation

## 2021-02-12 DIAGNOSIS — I10 Essential (primary) hypertension: Secondary | ICD-10-CM | POA: Insufficient documentation

## 2021-02-12 DIAGNOSIS — C7931 Secondary malignant neoplasm of brain: Secondary | ICD-10-CM | POA: Insufficient documentation

## 2021-02-12 DIAGNOSIS — K5730 Diverticulosis of large intestine without perforation or abscess without bleeding: Secondary | ICD-10-CM | POA: Insufficient documentation

## 2021-02-12 DIAGNOSIS — E119 Type 2 diabetes mellitus without complications: Secondary | ICD-10-CM | POA: Diagnosis not present

## 2021-02-12 DIAGNOSIS — Z79899 Other long term (current) drug therapy: Secondary | ICD-10-CM | POA: Diagnosis not present

## 2021-02-12 DIAGNOSIS — Z809 Family history of malignant neoplasm, unspecified: Secondary | ICD-10-CM | POA: Insufficient documentation

## 2021-02-12 DIAGNOSIS — Z8673 Personal history of transient ischemic attack (TIA), and cerebral infarction without residual deficits: Secondary | ICD-10-CM | POA: Diagnosis not present

## 2021-02-12 DIAGNOSIS — Z7982 Long term (current) use of aspirin: Secondary | ICD-10-CM | POA: Insufficient documentation

## 2021-02-12 DIAGNOSIS — Z794 Long term (current) use of insulin: Secondary | ICD-10-CM | POA: Insufficient documentation

## 2021-02-12 DIAGNOSIS — I2510 Atherosclerotic heart disease of native coronary artery without angina pectoris: Secondary | ICD-10-CM | POA: Insufficient documentation

## 2021-02-12 DIAGNOSIS — N189 Chronic kidney disease, unspecified: Secondary | ICD-10-CM | POA: Diagnosis not present

## 2021-02-12 DIAGNOSIS — C7951 Secondary malignant neoplasm of bone: Secondary | ICD-10-CM | POA: Diagnosis not present

## 2021-02-12 DIAGNOSIS — E785 Hyperlipidemia, unspecified: Secondary | ICD-10-CM | POA: Insufficient documentation

## 2021-02-12 DIAGNOSIS — C01 Malignant neoplasm of base of tongue: Secondary | ICD-10-CM | POA: Diagnosis not present

## 2021-02-12 DIAGNOSIS — Z85028 Personal history of other malignant neoplasm of stomach: Secondary | ICD-10-CM | POA: Insufficient documentation

## 2021-02-12 DIAGNOSIS — Z800 Family history of malignant neoplasm of digestive organs: Secondary | ICD-10-CM | POA: Diagnosis not present

## 2021-02-12 MED ORDER — DEXAMETHASONE 4 MG PO TABS: 4.0000 mg | ORAL_TABLET | Freq: Every day | ORAL | 1 refills | Status: DC

## 2021-02-12 NOTE — Progress Notes (Signed)
Pt states he has had sinus pain and pressure for several months that keeps him from sleeping and gets dizzy.

## 2021-02-12 NOTE — Progress Notes (Signed)
[facility]  [address], [address] [place], [place] [address] Phone: [phone]  Fax: [phone]   Clinic Day:  02/12/2021  Referring physician: Rutherford Limerick, PA  Chief Complaint: Kent [name] is a 65 y.o. 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  He was last evaluated by Dr. Mike Gip back in August 2021.  HPI: Mr. Ethington is a 65 year old male with past medical history significant for hypertension, stroke, CHF, diabetes, CKD, obesity who is followed by Dr. Mike Gip for carcinoma of base of tongue.  He is status post 3 cycles of cisplatin back in October 2017. He was previously followed by Dr. Mike Gip and is transitioning care to Dr. Janese Banks and has canceled several appointments recently.  His last PET scan was from 08/10/2019 revealing hypermetabolic left-sided cervical nodes consistent with recurrent metastatic disease.  No evidence of hypermetabolic extracervical metastasis.  This was followed by a left neck lymph node biopsy on 10/07/2019 which was consistent with squamous cell carcinoma.  He met with Dr. Mike Gip to discuss results and potential treatment options ( Pembrolizumab, carboplatin, 5FU) but was lost to follow-up. He unfortunately was not a good surgical candidate.  Foundation 1 did not reveal any actionable mutations.  PD-L1 status noted CPS 1.   He developed dizziness and headaches several months ago and was evaluated at [facility] prompting an MRI of his brain.  MRI on 12/12/2020 showed extensive metastatic disease in the left more than right paravertebral neck with left C1 and skull base invasion.  The left epidural canal is infiltrated at C1 and C2 without cord compression.  [facility] recommended he follow-up with oncology ASAP and he was started on gabapentin for head and neck pain.   Today, he presents with a friend.  He states gradually over the past few months he has  developed neck pain that radiates to his left ear causing dizziness and "his head to be swimmy".  He has been taking Advil PM to help him sleep due to the discomfort.  Reports insomnia.  He is able to swallow although has trouble chewing due to lack of teeth.  He is active and walks to and from a nearby store every morning.  Reports some sinus drainage but no cough.   Past Medical History:  Diagnosis Date   Arthritis    Diabetes mellitus without complication ([place])    Hyperlipidemia    Hypertension    Obesity    Osteomyelitis ([place])    From pt chart   Renal insufficiency    CKD noted on chart   Staph infection 05/29/2016   Nov 2017. 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   Tongue cancer ([place])    Stage IV squamous cell carcinoma of the base of the tongue/left tonsil    Past Surgical History:  Procedure Laterality Date   EXCISION OF TONGUE LESION Left 06/19/2016   Procedure: EXCISION OF TONGUE LESION;  Surgeon: Margaretha Sheffield, MD;  Location: [place];  Service: ENT;  Laterality: Left;  diabetic - insulin and oral meds - not using either currently   gastric tumor removed  2010   I & D EXTREMITY Left 01/09/2016   Procedure: IRRIGATION AND DEBRIDEMENT EXTREMITY;  Surgeon: Robert Bellow, MD;  Location: [facility];  Service: General;  Laterality:  Left;   LARYNGOSCOPY N/A 06/19/2016   Procedure: LARYNGOSCOPY;  Surgeon: Margaretha Sheffield, MD;  Location: [place];  Service: ENT;  Laterality: N/A;   PERIPHERAL VASCULAR CATHETERIZATION Right 12/31/2015   Procedure: Lower Extremity Angiography;  Surgeon: Algernon Huxley, MD;  Location: [place] [facility];  Service: Cardiovascular;  Laterality: Right;   tongue mass biopsy      Family History  Problem Relation Age of Onset   Heart disease Mother    Hypertension Father    Cancer Sister        stomach   Heart disease Brother         MI   Cancer Daughter    Cancer Son        testicular   ADD / ADHD Sister     Social History:  reports that he has never smoked. He has never used smokeless tobacco. He reports that he does not drink alcohol and does not use drugs. He smokes a pipe and cigar rarely (once every 3 months).  He has a son and daughter. 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 He currently lives at the [facility] in [place], [place].  Allergies: No Known Allergies  Current Medications: Current Outpatient Medications  Medication Sig Dispense Refill   amLODipine (NORVASC) 10 MG tablet Take 10 mg by mouth daily.     AMLODIPINE BESYLATE PO Take 5 mg by mouth daily. (Patient not taking: Reported on 11/02/2019)     aspirin EC 81 MG EC tablet Take 1 tablet (81 mg total) by mouth daily.     atorvastatin (LIPITOR) 40 MG tablet Take 1 tablet (40 mg total) by mouth daily.     insulin detemir (LEVEMIR) 100 UNIT/ML injection Inject 0.08 mLs (8 Units total) into the skin daily. 10 mL 11   LANTUS SOLOSTAR 100 UNIT/ML Solostar Pen Inject into the skin.     levothyroxine (SYNTHROID) 25 MCG tablet Take 25 mcg by mouth daily.     losartan (COZAAR) 50 MG tablet Take 50 mg by mouth daily.     polyethylene glycol (MIRALAX / GLYCOLAX) 17 g packet Take 17 g by mouth 2 (two) times daily. (Patient not taking: Reported on 11/02/2019) 14 each 0   No current facility-administered medications for this visit.    Review of Systems  Constitutional:  Positive for malaise/fatigue and weight loss.  HENT:  Positive for ear pain and sinus pain.   Musculoskeletal:  Positive for myalgias.  Neurological:  Positive for dizziness, tingling, weakness and headaches.  Psychiatric/Behavioral:  The patient has insomnia.     Performance status (ECOG):  1-2  Vitals There were no vitals taken for this visit.   Physical Exam Constitutional:      Appearance: Normal appearance.  HENT:     Head: Normocephalic and atraumatic.   Eyes:     Pupils: Pupils are equal, round, and reactive to light.  Cardiovascular:     Rate and Rhythm: Normal rate and regular rhythm.     Heart sounds: Normal heart sounds. No murmur heard. Pulmonary:     Effort: Pulmonary effort is normal.     Breath sounds: Normal breath sounds. No wheezing.  Abdominal:     General: Bowel sounds are normal. There is no distension.     Palpations: Abdomen is soft.     Tenderness: There is no abdominal tenderness.  Musculoskeletal:        General: Normal range of motion.  Cervical back: Normal range of motion.  Lymphadenopathy:     Cervical: Cervical adenopathy present.     Left cervical: Superficial cervical adenopathy present.  Skin:    General: Skin is warm and dry.     Findings: No rash.  Neurological:     Mental Status: He is alert and oriented to person, place, and time.     Gait: Gait is intact.  Psychiatric:        Mood and Affect: Mood and affect normal.        Cognition and Memory: Memory normal.        Judgment: Judgment normal.    No visits with results within 3 Day(s) from this visit.  Latest known visit with results is:  Hospital Outpatient Visit on 10/07/2019  Component Date Value Ref Range Status   SURGICAL PATHOLOGY 10/07/2019    Final-Edited                   Value:SURGICAL PATHOLOGY THIS IS AN ADDENDUM REPORT CASE: [id number] PATIENT: Lee Templeman Surgical Pathology Report Addendum  Reason for Addendum #1:  Additional clinical/test information  Specimen Submitted: A. Neck mass, left; BIOPSY  Clinical History: In 2017 treated with chemo and radiation for squamous cell carcinoma, metastatic to left neck lymph nodes, clinically oropharyngeal primary (tongue base), p16 and HPV positive based on [facility] biopsy of left neck metastastasis. Now with recurrent left neck mass.  DIAGNOSIS: A. SOFT TISSUE MASS, LEFT NECK; ULTRASOUND-GUIDED CORE BIOPSY: - METASTATIC CARCINOMA CONSISTENT WITH SQUAMOUS CELL  CARCINOMA.  Comment: The biopsy cores consist of sclerotic stroma containing scattered islands of viable neoplastic cells and extensive necrosis. At the ends of some cores there is atrophic skeletal muscle. The tumor cells are small, with high nuclear/cytoplasmic ratio and distinct nucleoli. No sq  uamous differentiation is seen on routine sections. Immunohistochemistry (IHC) was performed for further characterization. The neoplastic cells are positive for p40 with diffuse strong nuclear staining. They are positive for p16 with diffuse strong nuclear and cytoplasmic staining.  The IHC profile supports the above diagnosis, and is the same as reported on a previous biopsy specimen from the left neck: [facility] Surgical Pathology Case: [id number] (09/10/2015). [facility] specimen was also positive for high-risk HPV by in situ hybridization.   There are sufficient tumor cells for PD-L1 testing. The proportion of viable tumor cells is probably too low for successful molecular genetic testing.  A left neck mass fine needle aspirate performed at [facility] on 09/07/19 was reported as atypical, suspicious for squamous cell carcinoma based on a single small group of cells positive for p40.  IHC slides were prepared by [facility], [place], [place]. All con                         trols stained appropriately.  This test was developed and its performance characteristics determined by [facility]. It has not been cleared or approved by the [place] Food and Drug Administration. The FDA does not require this test to go through premarket FDA review. This test is used for clinical purposes. It should not be regarded as investigational or for research. This laboratory is certified under the Clinical Laboratory Improvement Amendments (CLIA) as qualified to perform high complexity clinical laboratory testing.  GROSS DESCRIPTION: A. Labeled: Left neck mass Received: Formalin Number of  needle core biopsy(s): 4 and multiple additional fragments Length: Range from 1.3 to 1.8 cm Diameter: 0.2 cm Description: Received  are cores and fragments of tan-white soft tissue. The additional fragments are 1.2 x 0.7 x 0.2 cm in aggregate. Ink: None Entirely submitted in cassettes 1-5 with 1 core per cassettes 1-4 and the remaining fragments in cassette 5.  Final Diagno                         sis performed by Bryan Lemma, MD.   Electronically signed 10/11/2019 3:32:35PM The electronic signature indicates that the named Attending Pathologist has evaluated the specimen Technical component performed at [facility], [address] [place], [place] [address] Lab: [phone] Dir: Rush Farmer, MD, MMM  Professional component performed at [facility], [facility], [place], [place], [place] [address] Lab: [phone] Dir: Dellia Nims. Rubinas, MD  ADDENDUM: The slides from the previous left upper neck lymph node biopsy were reviewed ([id number], 08/24/15). The neoplastic cells are similar in appearance, but in the previous sample there are foci of cells with eosinophilic cytoplasm suggesting squamous differentiation, not seen in the current sample. Both specimens show stromal sclerosis, but there is more lymphoplasmacytic inflammation in the previous sample.  Addendum #1 performed by Bryan Lemma, MD.   Electronically signed 10/11/2019 4:35:16PM                          The electronic signature indicates that the named Attending Pathologist has evaluated the specimen Technical component performed at [facility], [address] [place], [place] [address] Lab: [phone] Dir: Rush Farmer, MD, MMM  Professional component performed at [facility], [facility], [place], [place], [place] [address] Lab: [phone] Dir: Dellia Nims. Reuel Derby, MD    Assessment: Mr. Stegman presents today for follow-up after recent MRI of his brain.   Reviewed MRI with  patient that showed extensive metastatic disease in the left more than right paravertebral neck with left C1 and skull base invasion.  The left epidural canal is infiltrated at C1 and C2 without cord compression.  Discussed goals of care with patient including treatment options such as radiation versus chemotherapy/immunotherapy if he is deemed a good candidate versus palliative care and hospice.  He is interested in pursuing a discussion with radiation oncology and additional imaging with a PET scan to determine how extensive his disease is.  Discussed plan with Dr. Grayland Ormond who is in agreement.  Patient will follow up with Dr. Donella Stade on 02/20/2021 and had PET scan on 02/21/2021. Follow-up with Dr. Janese Banks after PET to discuss treatment options versus palliative/hospice care.  Dizziness/headaches- Likely secondary to paravertebral neck with cervical involvement.  Recommend dexamethasone 4 mg daily.  New prescription sent.  Disposition- Begin steroids.  Follow-up with Dr. Donella Stade next week and with Dr. Janese Banks on 02/27/2021.  PET scan on 02/21/2021.  I spent 25 minutes dedicated to the care of this patient (face-to-face and non-face-to-face) on the date of the encounter to include what is described in the assessment and plan.  Faythe Casa, NP 02/12/2021 3:51 PM

## 2021-02-13 NOTE — Addendum Note (Signed)
Addended by: Faythe Casa E on: 02/13/2021 08:31 AM   Modules accepted: Orders

## 2021-02-20 ENCOUNTER — Inpatient Hospital Stay (HOSPITAL_BASED_OUTPATIENT_CLINIC_OR_DEPARTMENT_OTHER): Payer: Medicare Other | Admitting: Hospice and Palliative Medicine

## 2021-02-20 ENCOUNTER — Encounter: Payer: Self-pay | Admitting: Radiation Oncology

## 2021-02-20 ENCOUNTER — Ambulatory Visit
Admission: RE | Admit: 2021-02-20 | Discharge: 2021-02-20 | Disposition: A | Discharge status: Home / Self Care | Payer: Medicare Other | Source: Ambulatory Visit | Attending: Radiation Oncology | Admitting: Radiation Oncology

## 2021-02-20 ENCOUNTER — Other Ambulatory Visit: Payer: Self-pay

## 2021-02-20 VITALS — BP 146/92 | HR 96 | Temp 97.6°F | Wt 166.3 lb

## 2021-02-20 DIAGNOSIS — C01 Malignant neoplasm of base of tongue: Secondary | ICD-10-CM | POA: Insufficient documentation

## 2021-02-20 DIAGNOSIS — Z7982 Long term (current) use of aspirin: Secondary | ICD-10-CM | POA: Diagnosis not present

## 2021-02-20 DIAGNOSIS — G893 Neoplasm related pain (acute) (chronic): Secondary | ICD-10-CM | POA: Diagnosis not present

## 2021-02-20 DIAGNOSIS — E119 Type 2 diabetes mellitus without complications: Secondary | ICD-10-CM | POA: Insufficient documentation

## 2021-02-20 DIAGNOSIS — E785 Hyperlipidemia, unspecified: Secondary | ICD-10-CM | POA: Insufficient documentation

## 2021-02-20 DIAGNOSIS — M129 Arthropathy, unspecified: Secondary | ICD-10-CM | POA: Diagnosis not present

## 2021-02-20 DIAGNOSIS — Z79899 Other long term (current) drug therapy: Secondary | ICD-10-CM | POA: Diagnosis not present

## 2021-02-20 DIAGNOSIS — Z515 Encounter for palliative care: Secondary | ICD-10-CM | POA: Diagnosis not present

## 2021-02-20 DIAGNOSIS — E669 Obesity, unspecified: Secondary | ICD-10-CM | POA: Diagnosis not present

## 2021-02-20 DIAGNOSIS — I10 Essential (primary) hypertension: Secondary | ICD-10-CM | POA: Insufficient documentation

## 2021-02-20 MED ORDER — HYDROCODONE-ACETAMINOPHEN 5-325 MG PO TABS: 1.0000 | ORAL_TABLET | Freq: Four times a day (QID) | ORAL | 0 refills | Status: DC | PRN

## 2021-02-20 NOTE — Progress Notes (Signed)
[place] at [facility] [phone] [phone]   Name: Kent [name] Date: 02/20/2021 MRN: [id number]  DOB: 13-Mar-1955  Patient Care Team: Rutherford Limerick, PA as PCP - General (Physician Assistant) Lequita Asal, MD (Inactive) as Referring Physician (Hematology and Oncology) Margaretha Sheffield, MD (Otolaryngology)    REASON FOR CONSULTATION: Kent [name] is a 65 y.o. 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  Patient met with Dr. Mike Gip following lymph node biopsy in September 2021 to discuss potential treatment options.  He was felt not to be a good surgical candidate.  Unfortunately, patient was ultimately lost to follow-up and then reestablished cancer care in December 2022.  Patient had an MRI of the brain at [facility] on 12/12/2020 revealing extensive metastatic disease with C1 and skull base invasion and left epidural canal infiltration without cord compression.  Patient has had severe neck and head pain.  Patient has been referred to radiation oncology.  Patient was referred to palliative care to help address goals.  SOCIAL HISTORY:     reports that he has never smoked. He has never used smokeless tobacco. He reports that he does not drink alcohol and does not use drugs.  Patient is divorced.  He lives at home with his sister.  Patient has a son and daughter.  Patient retired from [employer] where he worked in the [employer] facility.  ADVANCE DIRECTIVES:  Does not have  CODE STATUS:   PAST MEDICAL HISTORY: Past Medical History:  Diagnosis Date   Arthritis    Diabetes mellitus without complication ([place])    Hyperlipidemia    Hypertension    Obesity    Osteomyelitis ([place])    From pt chart   Renal insufficiency    CKD noted on chart   Staph infection  05/29/2016   Nov 2017. 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   Tongue cancer ([place])    Stage IV squamous cell carcinoma of the base of the tongue/left tonsil    PAST SURGICAL HISTORY:  Past Surgical History:  Procedure Laterality Date   EXCISION OF TONGUE LESION Left 06/19/2016   Procedure: EXCISION OF TONGUE LESION;  Surgeon: Margaretha Sheffield, MD;  Location: [place];  Service: ENT;  Laterality: Left;  diabetic - insulin and oral meds - not using either currently   gastric tumor removed  2010   I & D EXTREMITY Left 01/09/2016   Procedure: IRRIGATION AND DEBRIDEMENT EXTREMITY;  Surgeon: Robert Bellow, MD;  Location: [facility];  Service: General;  Laterality: Left;   LARYNGOSCOPY N/A 06/19/2016   Procedure: LARYNGOSCOPY;  Surgeon: Margaretha Sheffield, MD;  Location: [place];  Service: ENT;  Laterality: N/A;   PERIPHERAL VASCULAR CATHETERIZATION Right 12/31/2015   Procedure: Lower Extremity Angiography;  Surgeon: Algernon Huxley, MD;  Location: [place] [facility];  Service: Cardiovascular;  Laterality: Right;   tongue mass biopsy      HEMATOLOGY/ONCOLOGY HISTORY:  Oncology History   No history exists.    ALLERGIES:  has No Known Allergies.  MEDICATIONS:  Current Outpatient Medications  Medication Sig Dispense Refill   amLODipine (NORVASC) 10 MG tablet Take 10 mg by mouth daily.     aspirin  EC 81 MG EC tablet Take 1 tablet (81 mg total) by mouth daily.     atorvastatin (LIPITOR) 40 MG tablet Take 1 tablet (40 mg total) by mouth daily.     dexamethasone (DECADRON) 4 MG tablet Take 1 tablet (4 mg total) by mouth daily. 60 tablet 1   LANTUS SOLOSTAR 100 UNIT/ML Solostar Pen Inject into the skin.     levothyroxine (SYNTHROID) 25 MCG tablet Take 25 mcg by mouth daily.     losartan (COZAAR) 50 MG tablet Take 50 mg by mouth daily.     polyethylene glycol (MIRALAX / GLYCOLAX)  17 g packet Take 17 g by mouth 2 (two) times daily. (Patient not taking: Reported on 11/02/2019) 14 each 0   No current facility-administered medications for this visit.    VITAL SIGNS: There were no vitals taken for this visit. There were no vitals filed for this visit.  Estimated body mass index is 26.05 kg/m as calculated from the following:   Height as of 09/28/19: _0  (1.702 m).   Weight as of an earlier encounter on 02/20/21: 166 lb 4.8 oz (75.4 kg).  LABS: CBC:    Component Value Date/Time   WBC 10.7 (H) 07/02/2019 0554   HGB 15.0 07/02/2019 0554   HGB 15.2 02/14/2013 1204   HCT 43.0 07/02/2019 0554   HCT 43.6 02/14/2013 1204   PLT 230 07/02/2019 0554   PLT 188 02/14/2013 1204   MCV 83.7 07/02/2019 0554   MCV 85 02/14/2013 1204   NEUTROABS 13.8 (H) 06/27/2019 0625   LYMPHSABS 0.7 06/27/2019 0625   MONOABS 1.1 (H) 06/27/2019 0625   EOSABS 0.0 06/27/2019 0625   BASOSABS 0.0 06/27/2019 0625   Comprehensive Metabolic Panel:    Component Value Date/Time   NA 134 (L) 07/02/2019 0554   NA 125 (L) 01/07/2016 1322   NA 127 (L) 02/14/2013 1204   K 3.3 (L) 07/02/2019 0554   K 3.8 02/14/2013 1204   CL 98 07/02/2019 0554   CL 93 (L) 02/14/2013 1204   CO2 27 07/02/2019 0554   CO2 28 02/14/2013 1204   BUN 20 07/02/2019 0554   BUN 12 01/07/2016 1322   BUN 21 (H) 02/14/2013 1204   CREATININE 1.02 07/02/2019 0554   CREATININE 1.63 (H) 02/14/2013 1204   GLUCOSE 108 (H) 07/02/2019 0554   GLUCOSE 420 (H) 02/14/2013 1204   CALCIUM 8.0 (L) 07/02/2019 0554   CALCIUM 8.8 02/14/2013 1204   AST 27 06/27/2019 0625   AST 31 02/14/2013 1204   ALT 32 06/27/2019 0625   ALT 32 02/14/2013 1204   ALKPHOS 77 06/27/2019 0625   ALKPHOS 121 (H) 02/14/2013 1204   BILITOT 1.0 06/27/2019 0625   BILITOT 1.0 01/07/2016 1322   BILITOT 1.0 02/14/2013 1204   PROT 7.1 06/27/2019 0625   PROT 5.2 (L) 01/07/2016 1322   PROT 7.1 02/14/2013 1204   ALBUMIN 4.0 06/27/2019 0625   ALBUMIN 2.8 (L)  01/07/2016 1322   ALBUMIN 3.5 02/14/2013 1204    RADIOGRAPHIC STUDIES: No results found.  PERFORMANCE STATUS (ECOG) : 2 - Symptomatic, <50% confined to bed  Review of Systems Unless otherwise noted, a complete review of systems is negative.  Physical Exam General: NAD Pulmonary: Unlabored Extremities: no edema, no joint deformities Skin: no rashes Neurological: Weakness but otherwise nonfocal  IMPRESSION: I met with patient and his cousin today.  Introduced palliative care services and attempted to establish therapeutic rapport.  Patient saw Dr. Baruch Gouty earlier today with plan to  begin XRT.  Patient is pending PET scan tomorrow and then will have follow-up with Dr. Janese Banks on 12/28 to discuss treatment options.  I discussed general goals.  Patient was lost to follow-up with Dr. Mike Gip and he attributes this due to transportation issues.  Patient now says that he is committed to completing the work-up and wants to speak with Dr. Janese Banks about treatment options.  He says he is "iffy" on whether he would agree to chemotherapy as he does not think he would be interested in treatment that had a severe symptom burden or negatively impacted his quality of life.  We did discuss the alternative option of focusing on comfort/best supportive care.  At baseline, he reports he is functionally independent.  He lives at home with his sister.  He walks a couple blocks each day to the local store.  Symptomatically, he endorses neck/head pain, headaches, and dizziness, likely attributed to known metastatic cervical disease with skull base invasion.  Patient has been taking gabapentin but does not find this adequate.  He was started on dexamethasone but has not had significant improvement in symptoms.  Patient would like something "stronger" for pain.  Patient denies any previous experience with pain medications.  We will start him on low-dose Norco.  Discussed importance of a bowel regimen to prophylactically  prevent opioid-induced constipation.  Patient does not have advance directives.  I did send him home with ACP documents and a MOST form to review.  PLAN: -Plan to complete work-up, begin XRT, and see Dr. Janese Banks to discuss treatment options -ACP/MOST form reviewed -Start Norco 5-325 mg every 6 hours as needed #30 (PDMP reviewed) -Continue dexamethasone/gabapentin -Daily bowel regimen as needed -Referral for nutrition -Consider referral to establish with SLP -Follow-up telephone visit 2 weeks   Patient expressed understanding and was in agreement with this plan. He also understands that He can call the clinic at any time with any questions, concerns, or complaints.     Time Total: 25 minutes  Visit consisted of counseling and education dealing with the complex and emotionally intense issues of symptom management and palliative care in the setting of serious and potentially life-threatening illness.Greater than 50%  of this time was spent counseling and coordinating care related to the above assessment and plan.  Signed by: Altha Harm, PhD, NP-C

## 2021-02-20 NOTE — Consult Note (Signed)
NEW PATIENT EVALUATION  Name: Kent [name]  MRN: [id number]  Date:   02/20/2021     DOB: 02-Jan-1956   This 65 y.o. 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  REFERRING PHYSICIAN: Rutherford Limerick, PA  CHIEF COMPLAINT:  Chief Complaint  Patient presents with   Cancer    DIAGNOSIS: The encounter diagnosis was Carcinoma of base of tongue ([place]).   PREVIOUS INVESTIGATIONS:  MRI scans reviewed PET CT scan to be reviewed Clinical notes reviewed Pathology reports reviewed  HPI: Patient is a 65 year old male previously treated back in our department 2017 when he presented with stage IVb (T2 N3 M0) squamous cell carcinoma of the base of tongue.  Had a large conglomerate of left neck adenopathy.  He had done fairly well although recently presented with head and neck pain.  PET scan showed increased hypermetabolic cavity in the left neck as well as biopsy positive recurrence of metastatic squamous cell carcinoma and ultrasound-guided biopsy of hypermetabolic mass in the soft tissue of the left neck.  Recent MRI scan of his brain showed extensive metastatic disease in the left neck greater than the right in the paravertebral region with C1 and skull base invasion.  The left epidural canals infiltrated without cord compression.  He has been seen by palliative care is currently on Norco as well as dexamethasone and gabapentin.  He has a PET CT scan scheduled for tomorrow.  He is now referred to radiation oncology for consideration of palliative treatment.  PLANNED TREATMENT REGIMEN: IMRT radiation therapy to residual hypermetabolic disease in his head and neck region with possible concurrent chemotherapy  PAST MEDICAL HISTORY:  has a past medical history of Arthritis, Diabetes mellitus without complication ([place]), Hyperlipidemia, Hypertension, Obesity,  Osteomyelitis ([place]), Renal insufficiency, Staph infection (05/29/2016), Status post debridement, Stomach cancer ([place]), and Tongue cancer ([place]).    PAST SURGICAL HISTORY:  Past Surgical History:  Procedure Laterality Date   EXCISION OF TONGUE LESION Left 06/19/2016   Procedure: EXCISION OF TONGUE LESION;  Surgeon: Margaretha Sheffield, MD;  Location: [facility];  Service: ENT;  Laterality: Left;  diabetic - insulin and oral meds - not using either currently   gastric tumor removed  2010   I & D EXTREMITY Left 01/09/2016   Procedure: IRRIGATION AND DEBRIDEMENT EXTREMITY;  Surgeon: Robert Bellow, MD;  Location: [facility];  Service: General;  Laterality: Left;   LARYNGOSCOPY N/A 06/19/2016   Procedure: LARYNGOSCOPY;  Surgeon: Margaretha Sheffield, MD;  Location: [facility];  Service: ENT;  Laterality: N/A;   PERIPHERAL VASCULAR CATHETERIZATION Right 12/31/2015   Procedure: Lower Extremity Angiography;  Surgeon: Algernon Huxley, MD;  Location: [facility];  Service: Cardiovascular;  Laterality: Right;   tongue mass biopsy      FAMILY HISTORY: family history includes ADD / ADHD in his sister; Cancer in his daughter, sister, and son; Heart disease in his brother and mother; Hypertension in his father.  SOCIAL HISTORY:  reports that he has never smoked. He has never used smokeless tobacco. He reports that he does not drink alcohol and does not use drugs.  ALLERGIES: Patient has no known allergies.  MEDICATIONS:  Current Outpatient Medications  Medication Sig Dispense Refill   amLODipine (NORVASC) 10 MG tablet Take 10 mg by mouth daily.     aspirin EC 81 MG EC tablet  Take 1 tablet (81 mg total) by mouth daily.     atorvastatin (LIPITOR) 40 MG tablet Take 1 tablet (40 mg total) by mouth daily.     dexamethasone (DECADRON) 4 MG tablet Take 1 tablet (4 mg total) by mouth daily. 60 tablet 1   LANTUS SOLOSTAR 100 UNIT/ML Solostar Pen Inject into the skin.     levothyroxine (SYNTHROID) 25 MCG  tablet Take 25 mcg by mouth daily.     losartan (COZAAR) 50 MG tablet Take 50 mg by mouth daily.     polyethylene glycol (MIRALAX / GLYCOLAX) 17 g packet Take 17 g by mouth 2 (two) times daily. (Patient not taking: Reported on 11/02/2019) 14 each 0   No current facility-administered medications for this encounter.    ECOG PERFORMANCE STATUS:  2 - Symptomatic, <50% confined to bed  REVIEW OF SYSTEMS: Patient denies any weight loss, fatigue, weakness, fever, chills or night sweats. Patient denies any loss of vision, blurred vision. Patient denies any ringing  of the ears or hearing loss. No irregular heartbeat. Patient denies heart murmur or history of fainting. Patient denies any chest pain or pain radiating to her upper extremities. Patient denies any shortness of breath, difficulty breathing at night, cough or hemoptysis. Patient denies any swelling in the lower legs. Patient denies any nausea vomiting, vomiting of blood, or coffee ground material in the vomitus. Patient denies any stomach pain. Patient states has had normal bowel movements no significant constipation or diarrhea. Patient denies any dysuria, hematuria or significant nocturia. Patient denies any problems walking, swelling in the joints or loss of balance. Patient denies any skin changes, loss of hair or loss of weight. Patient denies any excessive worrying or anxiety or significant depression. Patient denies any problems with insomnia. Patient denies excessive thirst, polyuria, polydipsia. Patient denies any swollen glands, patient denies easy bruising or easy bleeding. Patient denies any recent infections, allergies or URI. Patient "s visual fields have not changed significantly in recent time.   PHYSICAL EXAM: BP (!) 146/92    Pulse 96    Temp 97.6 F (36.4 C) (Tympanic)    Wt 166 lb 4.8 oz (75.4 kg)    BMI 26.05 kg/m  Patient has firm fixed mass in his left neck.  Right neck shows some minor swelling no discrete adenopathy detected.   Well-developed well-nourished patient in NAD. HEENT reveals PERLA, EOMI, discs not visualized.  Oral cavity is clear. No oral mucosal lesions are identified. Neck is clear without evidence of cervical or supraclavicular adenopathy. Lungs are clear to A&P. Cardiac examination is essentially unremarkable with regular rate and rhythm without murmur rub or thrill. Abdomen is benign with no organomegaly or masses noted. Motor sensory and DTR levels are equal and symmetric in the upper and lower extremities. Cranial nerves II through XII are grossly intact. Proprioception is intact. No peripheral adenopathy or edema is identified. No motor or sensory levels are noted. Crude visual fields are within normal range.  LABORATORY DATA: Pathology report reviewed    RADIOLOGY RESULTS: MRI scan of head and neck previous PET scans reviewed and PET scan to be performed tomorrow will be reviewed   IMPRESSION: Recurrent head and neck cancer and patient previously treated 5 years prior for stage IVb squamous cell carcinoma of the head and neck  PLAN: At this time I would offer salvage radiation therapy to his head and neck region.  I would plan on delivering 60 Gray to areas of hypermetabolic activity.  My dose  constraints will be dictated by involvement of his spinal cord.  I would like to review his PET CT scan and have scheduled him for CT simulation next week.  Risks and benefits of treatment including possible dysphagia skin reaction further fibrosis of his head and neck region xerostomia alteration of taste all were discussed in detail with the patient.  He comprehends my recommendations well.  Patient will be evaluated by medical oncology next week and I will review all his current findings including PET CT scan prior to simulation next week.  I would like to take this opportunity to thank you for allowing me to participate in the care of your patient.Noreene Filbert, MD

## 2021-02-20 NOTE — Addendum Note (Signed)
Addended by: Altha Harm R on: 02/20/2021 02:06 PM   Modules accepted: Orders

## 2021-02-21 ENCOUNTER — Encounter
Admission: RE | Admit: 2021-02-21 | Discharge: 2021-02-21 | Disposition: A | Discharge status: Home / Self Care | Payer: Medicare Other | Source: Ambulatory Visit | Attending: Oncology | Admitting: Oncology

## 2021-02-21 DIAGNOSIS — C01 Malignant neoplasm of base of tongue: Secondary | ICD-10-CM | POA: Diagnosis present

## 2021-02-21 LAB — GLUCOSE, CAPILLARY: Glucose-Capillary: 71 mg/dL (ref 70–99)

## 2021-02-21 IMAGING — CT NM PET TUM IMG RESTAG (PS) SKULL BASE T - THIGH
1 of 10 series · 1 of 25 positions shown · non-contrast
Comparison: Multiple exams, including [DATE]

CLINICAL DATA: Subsequent treatment strategy for carcinoma of the
tongue base.

EXAM:
NUCLEAR MEDICINE PET SKULL BASE TO THIGH
TECHNIQUE: 8.9 mCi F-18 FDG was injected intravenously. Full-ring PET imaging
was performed from the skull base to thigh after the radiotracer. CT
data was obtained and used for attenuation correction and anatomic
localization.
Fasting blood glucose: 71 mg/dl

[Series 3: ct wb 5.0 b30f · axial · 5.0mm · 0.98mm/px · 1 of 325 slices shown]
[im 325/325  brain]
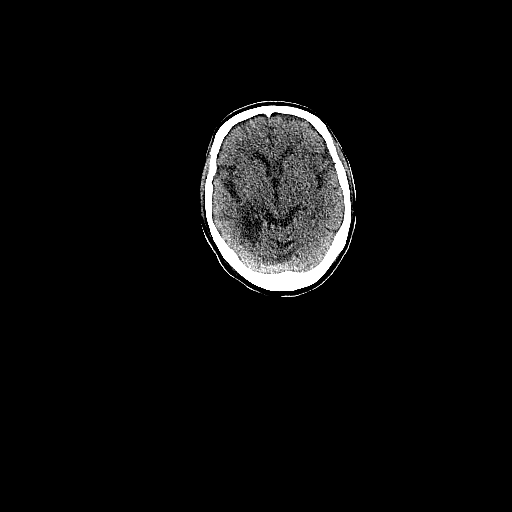

[1 of 25 positions shown; findings below may reference images not displayed]

FINDINGS: Mediastinal blood pool activity: SUV max

Liver activity: SUV max NA

NECK: Extensive multifocal infiltrative malignancy in the left neck
musculature, notably the left scalene musculature, left longissimus
cervicis, left levator scapulae, left obliquus capitis, left rectus
capitis, the posterior belly of the digastric, and tracking into the
longus capitis muscular region to extend across midline in the
vicinity of the posterior nasopharynx. This extends to the margin of
the left occipital skull although gross penetration into the skull
is not well shown. There is tumor extending up into the left
semispinalis capitis muscle towards the occiput, and infiltrative
tumor involving the left sternocleidomastoid muscle with
obliteration of normal fat planes and tissue planes along the
internal jugular space. The left internal jugular space infiltrative
process has a maximum SUV of 7.4, previously 6.9, and has
substantially worsened compared to the prior PET-CT. Along the lower
lateral margin of the posterior scalene muscle, a 3.6 by 2.7 cm mass
has a maximum SUV of 13.1.

The left upper cervical paraspinal tumor has representative SUV of
about 10.5, and is shown to extend into the left side of the spinal
canal in the epidural space at the C1-2 level causing some rightward
deviation of the cord as shown on image 23 series 3.

There is also a rounded tumor deposit measuring about 2.2 by 2.4 cm
on image 21 of series 3 in the RIGHT (contralateral) upper neck
paraspinal musculature in the vicinity of the right rectus capitis
posterior muscle, maximum SUV 9.0.

An indistinctly marginated left level III lymph node measuring about
2.0 cm in short axis on image 49 series 3 has a maximum SUV of 10.6.

A left level IV lymph node is present measuring 1.3 cm in short axis
on image sixty of series 3 with maximum SUV 9.3. There is an
immediately adjacent supraclavicular lymph node measuring 1.4 cm in
short axis on image 67 of series 3 with maximum SUV 12.2. A right
level IIb lymph node measuring 0.7 cm in short axis on image 29
series 3 has a maximum SUV of 3.3.

Reduced intracranial metabolic activity in the right temporal lobe
with associated adjacent dilation of the temporal horn of the right
lateral ventricle.

Asymmetric activity of the vocal cords, the right cord having a
maximum SUV of 3.5 and the left having a maximum SUV of 2.7, cannot
exclude left vocal cord paralysis as a potential cause.

There is bony discontinuity in the left transverse process of C1
extending into the transverse foramen on image 25 of series 3
compatible with fracture, with mild sclerosis in the left transverse
process. Tumor invasion and/or radiation therapy in the region may
be contributory or causative. If there is concern regarding patency
of the left vertebral artery, CTA or ultrasound could be utilized.

Please note that tumor on the left side is in close proximity to
left skull base foramina, and extension of tumor into or through the
foramina is difficult to confidently exclude on today's PET-CT.

Incidental CT findings: Mild chronic left maxillary sinusitis.

CHEST: There is a hypermetabolic left supraclavicular lymph node but
this is discussed above in the neck section given its proximity to
adjacent level IV adenopathy.

A left upper lobe subpleural nodule measuring 9 by 5 mm on image 104
of series 3 has a maximum SUV of 1.5, but is only borderline for
sensitive PET-CT assessment. This nodule was not appreciable on
[DATE].

An 8 by 6 mm lingular nodule on image 119 of series 3 has a maximum
SUV of 1.7, and is likewise borderline for sensitive PET-CT size
assessment. This nodule was not present on [DATE].

A 4 mm left lower lobe nodule shown on image 116 series 3 previously
measured 3 mm in diameter on [DATE], and is not hypermetabolic.
My goal

Incidental CT findings: Coronary, aortic arch, and branch vessel
atherosclerotic vascular disease. Airway thickening and airway
plugging in both lower lobes with some scattered ground-glass
opacities in the lower lobes.

A 5 mm right lower lobe pulmonary nodule on image 115 of series 3 is
not readily apparent on the [DATE] exam, and not appreciably
hypermetabolic. However, some of the ground-glass density in the
right lower lobe is somewhat hypermetabolic including a 1 cm
confluence of ground-glass density medially in the right lower lobe
on image 119 of series 3 with maximum SUV of 2.7. Based on the
density of this process I favor it as being inflammatory, but
surveillance may be warranted.

ABDOMEN/PELVIS: No significant abnormal hypermetabolic activity in
this region.

Incidental CT findings: Right kidney upper pole photopenic cyst.
Atherosclerosis is present, including aortoiliac atherosclerotic
disease. Sigmoid colon diverticulosis. Heterotopic ossification in
the midline upper abdomen anteriorly measuring 5.7 cm in length.

SKELETON: Tumor invasion of the left side of the spinal canal at the
C1-2 level and fracture of the left C1 transverse process as noted
in the neck section above. No further findings of osseous tumor
identified.

Incidental CT findings: Degenerative glenohumeral arthropathy on the
right. Dextroconvex lumbar scoliosis.
IMPRESSION: 1. Extensive progression of infiltrative and nodular tumor
involvement of the left neck and to a lesser extent at the right
neck as discussed in detail above. This includes internal jugular
adenopathy, extensive muscle involvement, invasion of the spinal
canal at the C1-2 level, and abutment of the skull base in the
vicinity of the skull-base foramina such that foraminal invasion
cannot be readily excluded although no overt bony destruction of the
skull is currently appreciable. Tumor extends primarily down the
left neck to the level of the thoracic inlet and is associated with
lower neck adenopathy and infiltrative tumor in the upper internal
jugular space.
2. Fracture left transverse process at C1 extending into the
transverse foramen, radiation therapy and/or tumor may be
contributory. If there is clinical concern regarding patency of the
left vertebral artery, CT angiography of the neck could be employed.
3. Two nodules in the left lung average about 7 mm in diameter and
demonstrate only low-grade activity, but these are at the borderline
of accurate characterization by PET-CT and surveillance is
recommended to exclude metastatic disease.
4. Bilateral lower lobe airway thickening with some plugging and
ground-glass opacities. In the right lower lobe some of the
ground-glass opacity has accentuated metabolic activity but I still
favor this as being inflammatory, surveillance suggested.
5. Other imaging findings of potential clinical significance: Mild
chronic left maxillary sinusitis. Aortic Atherosclerosis
([9W]-[9W]). Coronary atherosclerosis. Sigmoid colon
diverticulosis. Dextroconvex lumbar scoliosis.

## 2021-02-21 MED ORDER — FLUDEOXYGLUCOSE F - 18 (FDG) INJECTION
8.4000 | Freq: Once | INTRAVENOUS | Status: AC
  Administered 2021-02-21: 13:00:00 8.9 via INTRAVENOUS

## 2021-02-27 ENCOUNTER — Inpatient Hospital Stay: Payer: Medicare Other | Admitting: Oncology

## 2021-03-01 ENCOUNTER — Inpatient Hospital Stay: Payer: Medicare Other

## 2021-03-01 ENCOUNTER — Telehealth (INDEPENDENT_AMBULATORY_CARE_PROVIDER_SITE_OTHER): Payer: Self-pay

## 2021-03-01 ENCOUNTER — Ambulatory Visit: Admission: RE | Admit: 2021-03-01 | Payer: Medicare Other | Source: Ambulatory Visit

## 2021-03-01 ENCOUNTER — Other Ambulatory Visit: Payer: Self-pay

## 2021-03-01 ENCOUNTER — Inpatient Hospital Stay (HOSPITAL_BASED_OUTPATIENT_CLINIC_OR_DEPARTMENT_OTHER): Payer: Medicare Other | Admitting: Oncology

## 2021-03-01 ENCOUNTER — Encounter: Payer: Self-pay | Admitting: Oncology

## 2021-03-01 VITALS — BP 109/70 | HR 89 | Temp 99.0°F | Resp 20 | Wt 159.2 lb

## 2021-03-01 DIAGNOSIS — C01 Malignant neoplasm of base of tongue: Secondary | ICD-10-CM | POA: Insufficient documentation

## 2021-03-01 DIAGNOSIS — R6889 Other general symptoms and signs: Secondary | ICD-10-CM

## 2021-03-01 DIAGNOSIS — Z510 Encounter for antineoplastic radiation therapy: Secondary | ICD-10-CM | POA: Insufficient documentation

## 2021-03-01 DIAGNOSIS — Z7189 Other specified counseling: Secondary | ICD-10-CM | POA: Diagnosis not present

## 2021-03-01 DIAGNOSIS — C7931 Secondary malignant neoplasm of brain: Secondary | ICD-10-CM | POA: Diagnosis not present

## 2021-03-01 LAB — COMPREHENSIVE METABOLIC PANEL
ALT: 15 U/L (ref 0–44)
AST: 16 U/L (ref 15–41)
Albumin: 3.6 g/dL (ref 3.5–5.0)
Alkaline Phosphatase: 96 U/L (ref 38–126)
Anion gap: 10 (ref 5–15)
BUN: 14 mg/dL (ref 8–23)
CO2: 30 mmol/L (ref 22–32)
Calcium: 8.5 mg/dL — ABNORMAL LOW (ref 8.9–10.3)
Chloride: 89 mmol/L — ABNORMAL LOW (ref 98–111)
Creatinine, Ser: 0.83 mg/dL (ref 0.61–1.24)
GFR, Estimated: >60 mL/min (ref >60)
Glucose, Bld: 107 mg/dL — ABNORMAL HIGH (ref 70–99)
Potassium: 3.2 mmol/L — ABNORMAL LOW (ref 3.5–5.1)
Sodium: 129 mmol/L — ABNORMAL LOW (ref 135–145)
Total Bilirubin: 1 mg/dL (ref 0.3–1.2)
Total Protein: 7 g/dL (ref 6.5–8.1)

## 2021-03-01 LAB — CBC WITH DIFFERENTIAL/PLATELET
Abs Immature Granulocytes: 0.13 10*3/uL — ABNORMAL HIGH (ref 0.00–0.07)
Basophils Absolute: 0.1 10*3/uL (ref 0.0–0.1)
Basophils Relative: 1 %
Eosinophils Absolute: 0.1 10*3/uL (ref 0.0–0.5)
Eosinophils Relative: 1 %
HCT: 39.7 % (ref 39.0–52.0)
Hemoglobin: 13.5 g/dL (ref 13.0–17.0)
Immature Granulocytes: 1 %
Lymphocytes Relative: 7 %
Lymphs Abs: 0.9 10*3/uL (ref 0.7–4.0)
MCH: 29.2 pg (ref 26.0–34.0)
MCHC: 34 g/dL (ref 30.0–36.0)
MCV: 85.7 fL (ref 80.0–100.0)
Monocytes Absolute: 0.9 10*3/uL (ref 0.1–1.0)
Monocytes Relative: 7 %
Neutro Abs: 10.6 10*3/uL — ABNORMAL HIGH (ref 1.7–7.7)
Neutrophils Relative %: 83 %
Platelets: 312 10*3/uL (ref 150–400)
RBC: 4.63 MIL/uL (ref 4.22–5.81)
RDW: 13.8 % (ref 11.5–15.5)
WBC: 12.7 10*3/uL — ABNORMAL HIGH (ref 4.0–10.5)
nRBC: 0 % (ref 0.0–0.2)

## 2021-03-01 LAB — TSH: TSH: 5.301 u[IU]/mL — ABNORMAL HIGH (ref 0.350–4.500)

## 2021-03-01 NOTE — Telephone Encounter (Signed)
Spoke with the patient's son and he has been scheduled with Dr. Lucky Cowboy for a port placement on 03/07/21 with a 9:00 am arrival time to the [facility]. Pre-procedure instructions were discussed and will be mailed.

## 2021-03-01 NOTE — Telephone Encounter (Signed)
Received from Novella Olive RN:  pt needs port before 1/9 that is when chemo starts. I put ref. in . thank you  I got it and working on it. Thank you I called the patient and he said hello, then proceeded not to say anything and hung up.

## 2021-03-01 NOTE — Progress Notes (Signed)
Patient here for follow up  he has no concerns today.

## 2021-03-04 ENCOUNTER — Encounter: Payer: Self-pay | Admitting: Oncology

## 2021-03-04 ENCOUNTER — Encounter: Payer: Self-pay | Admitting: Hematology and Oncology

## 2021-03-04 MED ORDER — LIDOCAINE-PRILOCAINE 2.5-2.5 % EX CREA: TOPICAL_CREAM | CUTANEOUS | 3 refills | Status: DC

## 2021-03-04 MED ORDER — DEXAMETHASONE 4 MG PO TABS: 8.0000 mg | ORAL_TABLET | Freq: Every day | ORAL | 1 refills | Status: DC

## 2021-03-04 MED ORDER — LORAZEPAM 0.5 MG PO TABS: 0.5000 mg | ORAL_TABLET | Freq: Four times a day (QID) | ORAL | 0 refills | Status: DC | PRN

## 2021-03-04 MED ORDER — PROCHLORPERAZINE MALEATE 10 MG PO TABS: 10.0000 mg | ORAL_TABLET | Freq: Four times a day (QID) | ORAL | 1 refills | Status: DC | PRN

## 2021-03-04 MED ORDER — ONDANSETRON HCL 8 MG PO TABS: 8.0000 mg | ORAL_TABLET | Freq: Two times a day (BID) | ORAL | 1 refills | Status: DC | PRN

## 2021-03-04 NOTE — H&P (View-Only) (Signed)
Hematology/Oncology Consult note [facility]  [phone] [phone]  Patient Care Team: Rutherford Limerick, PA as PCP - General (Physician Assistant) Lequita Asal, MD (Inactive) as Referring Physician (Hematology and Oncology) Margaretha Sheffield, MD (Otolaryngology)   Name of the patient: Kent [name]  [id number]  02/09/1956   Date of visit: 03/04/21  Diagnosis-recurrent locally advanced base of tongue carcinoma  Chief complaint/ Reason for visit-discuss PET CT scan results and further management  Heme/Onc history: Patient is a 66 year old male who has seen Dr. Mike Gip in the past.  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  Tumor was p16 positive.  He received concurrent chemoradiation with 3 cycles of cisplatin.  He has had a gradually progressive infiltrative malignancy since then PD-L1 was checked on his original biopsy specimen with a CPS score of 1.  No reportable actionable mutations.  Carbo 5-FU Keytruda regimen was discussed by Dr. Mike Gip back in September 2021 but patient ultimately did notFollow-up.  More recently patient was noted to have more pain in his neck and underwent a repeat PET scan which shows extensive progression of infiltrative and nodular tumor involving the left neck along with internal jugular adenopathy.  Invasion of spinal canal at C1-C2 level with extensive muscle involvement and abutment of skull base.  Fracture of the left transverse process of C1.  Borderline low-grade activity noted into lung nodule 7 mm in size but otherwise no evidence of distant metastatic disease patient has met with radiation oncology and will be starting palliative radiation treatment next week.  He is here to discuss systemic options  Interval history-patient is here with his cousin today.Currently he is on Norco every 6 hours as needed for his pain.  He is following up with palliative care for  the same as well.  Reports that he does not have any significant difficulty swallowing.  Appetite and weight have remained stable  ECOG PS- 2 Pain scale- 4 Opioid associated constipation- no  Review of systems- Review of Systems  Constitutional:  Positive for malaise/fatigue.  HENT:         Neck pain      No Known Allergies   Past Medical History:  Diagnosis Date   Arthritis    Diabetes mellitus without complication ([place])    Hyperlipidemia    Hypertension    Obesity    Osteomyelitis ([place])    From pt chart   Renal insufficiency    CKD noted on chart   Staph infection 05/29/2016   Nov 2017. Staph infection in LT shoulder requiring debridement and grafts   Status post debridement    Stomach cancer ([place])    had tumor removed- [facility]   Tongue cancer ([place])    Stage IV squamous cell carcinoma of the base of the tongue/left tonsil     Past Surgical History:  Procedure Laterality Date   EXCISION OF TONGUE LESION Left 06/19/2016   Procedure: EXCISION OF TONGUE LESION;  Surgeon: Margaretha Sheffield, MD;  Location: [place];  Service: ENT;  Laterality: Left;  diabetic - insulin and oral meds - not using either currently   gastric tumor removed  2010   I & D EXTREMITY Left 01/09/2016   Procedure: IRRIGATION AND DEBRIDEMENT EXTREMITY;  Surgeon: Robert Bellow, MD;  Location: [facility];  Service: General;  Laterality: Left;   LARYNGOSCOPY N/A 06/19/2016   Procedure: LARYNGOSCOPY;  Surgeon: Margaretha Sheffield, MD;  Location[facility];  Service: ENT;  Laterality: N/A;   PERIPHERAL VASCULAR CATHETERIZATION Right 12/31/2015   Procedure: Lower Extremity Angiography;  Surgeon: Algernon Huxley, MD;  Location: [place] [facility];  Service: Cardiovascular;  Laterality: Right;   tongue mass biopsy      Social History   Socioeconomic History   Marital status: Married    Spouse name: Not on file   Number of children: Not on file   Years of education: Not on file   Highest education  level: Not on file  Occupational History   Not on file  Tobacco Use   Smoking status: Never   Smokeless tobacco: Never  Vaping Use   Vaping Use: Never used  Substance and Sexual Activity   Alcohol use: No    Alcohol/week: 0.0 standard drinks   Drug use: No   Sexual activity: Not on file  Other Topics Concern   Not on file  Social History Narrative   Lives at home with wife. Ambulates independently.   Social Determinants of Health   Financial Resource Strain: Not on file  Food Insecurity: Not on file  Transportation Needs: Not on file  Physical Activity: Not on file  Stress: Not on file  Social Connections: Not on file  Intimate Partner Violence: Not on file    Family History  Problem Relation Age of Onset   Heart disease Mother    Hypertension Father    Cancer Sister        stomach   Heart disease Brother        MI   Cancer Daughter    Cancer Son        testicular   ADD / ADHD Sister      Current Outpatient Medications:    amLODipine (NORVASC) 10 MG tablet, Take 10 mg by mouth daily., Disp: , Rfl:    aspirin EC 81 MG EC tablet, Take 1 tablet (81 mg total) by mouth daily., Disp:  , Rfl:    atorvastatin (LIPITOR) 40 MG tablet, Take 1 tablet (40 mg total) by mouth daily., Disp:  , Rfl:    dexamethasone (DECADRON) 4 MG tablet, Take 1 tablet (4 mg total) by mouth daily., Disp: 60 tablet, Rfl: 1   HYDROcodone-acetaminophen (NORCO) 5-325 MG tablet, Take 1 tablet by mouth every 6 (six) hours as needed for moderate pain., Disp: 30 tablet, Rfl: 0   LANTUS SOLOSTAR 100 UNIT/ML Solostar Pen, Inject into the skin., Disp: , Rfl:    levothyroxine (SYNTHROID) 25 MCG tablet, Take 25 mcg by mouth daily., Disp: , Rfl:    losartan (COZAAR) 50 MG tablet, Take 50 mg by mouth daily., Disp: , Rfl:    polyethylene glycol (MIRALAX / GLYCOLAX) 17 g packet, Take 17 g by mouth 2 (two) times daily. (Patient not taking: Reported on 11/02/2019), Disp: 14 each, Rfl: 0  Physical exam:  Vitals:    03/01/21 1103  BP: 109/70  Pulse: 89  Resp: 20  Temp: 99 F (37.2 C)  TempSrc: Tympanic  SpO2: 100%  Weight: 159 lb 3.2 oz (72.2 kg)   Physical Exam Constitutional:      General: He is not in acute distress.    Comments: Sitting in a wheelchair  Cardiovascular:     Rate and Rhythm: Normal rate and regular rhythm.     Heart sounds: Normal heart sounds.  Pulmonary:     Effort: Pulmonary effort is normal.     Breath sounds: Normal breath sounds.  Abdominal:  General: Bowel sounds are normal.     Palpations: Abdomen is soft.  Skin:    General: Skin is warm and dry.  Neurological:     Mental Status: He is alert and oriented to person, place, and time.     CMP Latest Ref Rng & Units 03/01/2021  Glucose 70 - 99 mg/dL 107(H)  BUN 8 - 23 mg/dL 14  Creatinine 0.61 - 1.24 mg/dL 0.83  Sodium 135 - 145 mmol/L 129(L)  Potassium 3.5 - 5.1 mmol/L 3.2(L)  Chloride 98 - 111 mmol/L 89(L)  CO2 22 - 32 mmol/L 30  Calcium 8.9 - 10.3 mg/dL 8.5(L)  Total Protein 6.5 - 8.1 g/dL 7.0  Total Bilirubin 0.3 - 1.2 mg/dL 1.0  Alkaline Phos 38 - 126 U/L 96  AST 15 - 41 U/L 16  ALT 0 - 44 U/L 15   CBC Latest Ref Rng & Units 03/01/2021  WBC 4.0 - 10.5 K/uL 12.7(H)  Hemoglobin 13.0 - 17.0 g/dL 13.5  Hematocrit 39.0 - 52.0 % 39.7  Platelets 150 - 400 K/uL 312    No images are attached to the encounter.  NM PET Image Restag (PS) Skull Base To Thigh  Result Date: 02/22/2021 CLINICAL DATA:  Subsequent treatment strategy for carcinoma of the tongue base. EXAM: NUCLEAR MEDICINE PET SKULL BASE TO THIGH TECHNIQUE: 8.9 mCi F-18 FDG was injected intravenously. Full-ring PET imaging was performed from the skull base to thigh after the radiotracer. CT data was obtained and used for attenuation correction and anatomic localization. Fasting blood glucose: 71 mg/dl COMPARISON:  Multiple exams, including 08/10/2019 FINDINGS: Mediastinal blood pool activity: SUV max 2.3 Liver activity: SUV max NA NECK:  Extensive multifocal infiltrative malignancy in the left neck musculature, notably the left scalene musculature, left longissimus cervicis, left levator scapulae, left obliquus capitis, left rectus capitis, the posterior belly of the digastric, and tracking into the longus capitis muscular region to extend across midline in the vicinity of the posterior nasopharynx. This extends to the margin of the left occipital skull although gross penetration into the skull is not well shown. There is tumor extending up into the left semispinalis capitis muscle towards the occiput, and infiltrative tumor involving the left sternocleidomastoid muscle with obliteration of normal fat planes and tissue planes along the internal jugular space. The left internal jugular space infiltrative process has a maximum SUV of 7.4, previously 6.9, and has substantially worsened compared to the prior PET-CT. Along the lower lateral margin of the posterior scalene muscle, a 3.6 by 2.7 cm mass has a maximum SUV of 13.1. The left upper cervical paraspinal tumor has representative SUV of about 10.5, and is shown to extend into the left side of the spinal canal in the epidural space at the C1-2 level causing some rightward deviation of the cord as shown on image 23 series 3. There is also a rounded tumor deposit measuring about 2.2 by 2.4 cm on image 21 of series 3 in the RIGHT (contralateral) upper neck paraspinal musculature in the vicinity of the right rectus capitis posterior muscle, maximum SUV 9.0. An indistinctly marginated left level III lymph node measuring about 2.0 cm in short axis on image 49 series 3 has a maximum SUV of 10.6. A left level IV lymph node is present measuring 1.3 cm in short axis on image sixty of series 3 with maximum SUV 9.3. There is an immediately adjacent supraclavicular lymph node measuring 1.4 cm in short axis on image 67 of series 3 with maximum  SUV 12.2. A right level IIb lymph node measuring 0.7 cm in short axis  on image 29 series 3 has a maximum SUV of 3.3. Reduced intracranial metabolic activity in the right temporal lobe with associated adjacent dilation of the temporal horn of the right lateral ventricle. Asymmetric activity of the vocal cords, the right cord having a maximum SUV of 3.5 and the left having a maximum SUV of 2.7, cannot exclude left vocal cord paralysis as a potential cause. There is bony discontinuity in the left transverse process of C1 extending into the transverse foramen on image 25 of series 3 compatible with fracture, with mild sclerosis in the left transverse process. Tumor invasion and/or radiation therapy in the region may be contributory or causative. If there is concern regarding patency of the left vertebral artery, CTA or ultrasound could be utilized. Please note that tumor on the left side is in close proximity to left skull base foramina, and extension of tumor into or through the foramina is difficult to confidently exclude on today's PET-CT. Incidental CT findings: Mild chronic left maxillary sinusitis. CHEST: There is a hypermetabolic left supraclavicular lymph node but this is discussed above in the neck section given its proximity to adjacent level IV adenopathy. A left upper lobe subpleural nodule measuring 9 by 5 mm on image 104 of series 3 has a maximum SUV of 1.5, but is only borderline for sensitive PET-CT assessment. This nodule was not appreciable on 12/23/2016. An 8 by 6 mm lingular nodule on image 119 of series 3 has a maximum SUV of 1.7, and is likewise borderline for sensitive PET-CT size assessment. This nodule was not present on 12/23/2016. A 4 mm left lower lobe nodule shown on image 116 series 3 previously measured 3 mm in diameter on 12/23/2016, and is not hypermetabolic. My goal Incidental CT findings: Coronary, aortic arch, and branch vessel atherosclerotic vascular disease. Airway thickening and airway plugging in both lower lobes with some scattered ground-glass  opacities in the lower lobes. A 5 mm right lower lobe pulmonary nodule on image 115 of series 3 is not readily apparent on the 12/23/2016 exam, and not appreciably hypermetabolic. However, some of the ground-glass density in the right lower lobe is somewhat hypermetabolic including a 1 cm confluence of ground-glass density medially in the right lower lobe on image 119 of series 3 with maximum SUV of 2.7. Based on the density of this process I favor it as being inflammatory, but surveillance may be warranted. ABDOMEN/PELVIS: No significant abnormal hypermetabolic activity in this region. Incidental CT findings: Right kidney upper pole photopenic cyst. Atherosclerosis is present, including aortoiliac atherosclerotic disease. Sigmoid colon diverticulosis. Heterotopic ossification in the midline upper abdomen anteriorly measuring 5.7 cm in length. SKELETON: Tumor invasion of the left side of the spinal canal at the C1-2 level and fracture of the left C1 transverse process as noted in the neck section above. No further findings of osseous tumor identified. Incidental CT findings: Degenerative glenohumeral arthropathy on the right. Dextroconvex lumbar scoliosis. IMPRESSION: 1. Extensive progression of infiltrative and nodular tumor involvement of the left neck and to a lesser extent at the right neck as discussed in detail above. This includes internal jugular adenopathy, extensive muscle involvement, invasion of the spinal canal at the C1-2 level, and abutment of the skull base in the vicinity of the skull-base foramina such that foraminal invasion cannot be readily excluded although no overt bony destruction of the skull is currently appreciable. Tumor extends primarily down the left  neck to the level of the thoracic inlet and is associated with lower neck adenopathy and infiltrative tumor in the upper internal jugular space. 2. Fracture left transverse process at C1 extending into the transverse foramen, radiation  therapy and/or tumor may be contributory. If there is clinical concern regarding patency of the left vertebral artery, CT angiography of the neck could be employed. 3. Two nodules in the left lung average about 7 mm in diameter and demonstrate only low-grade activity, but these are at the borderline of accurate characterization by PET-CT and surveillance is recommended to exclude metastatic disease. 4. Bilateral lower lobe airway thickening with some plugging and ground-glass opacities. In the right lower lobe some of the ground-glass opacity has accentuated metabolic activity but I still favor this as being inflammatory, surveillance suggested. 5. Other imaging findings of potential clinical significance: Mild chronic left maxillary sinusitis. Aortic Atherosclerosis (ICD10-I70.0). Coronary atherosclerosis. Sigmoid colon diverticulosis. Dextroconvex lumbar scoliosis. Electronically Signed   By: Van Clines M.D.   On: 02/22/2021 14:00     Assessment and plan- Patient is a 66 y.o. 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  He is not a surgical candidate.  He is receiving palliative radiation treatment.  Patient is not interested in carrying a chemo pump.  I would recommend 6 cycles of carboplatinum along with Taxol and Keytruda given IV every 3 weeks for up to 6 cycles if tolerated.  Following that he will remain on maintenance Keytruda.  Discussed risk and benefit of chemotherapy including all but not limited to nausea, vomiting, low blood counts, risk of infections and hospitalizations.  Risk  of immunotherapy associated side effects including autoimmune thyroiditis hepatitis colitis endocrinopathies and pneumonitis associated with Keytruda.  Treatment will be given with a palliative intent.  Patient understands and agrees to proceed as planned.  We will plan for port placement and tentatively start chemotherapy in a week's time.  We will also refer him to nutrition counseling   Cancer Staging  Carcinoma of base of tongue ([place]) Staging form: Lip and Oral Cavity, AJCC 7th Edition - Clinical stage from 09/10/2015: Stage IVB (T2, N3, M0) - Unsigned Staged by: Managing physician Diagnostic confirmation: Positive histology Specimen type: Core Needle Biopsy Histopathologic type: Carcinoma, NOS Laterality: Left Location of involved lymph nodes: Level II, Level III, Level IV Extracapsular spread (ECS): No Human papillomavirus (HPV) status: Positive high risk Stage used in treatment planning: Yes National guidelines used in treatment planning: Yes Type of national guideline used in treatment planning: NCCN     Visit Diagnosis 1. Carcinoma of base of tongue ([place])   2. Goals of care, counseling/discussion      Dr. Randa Evens, MD, MPH [facility] [phone] 03/04/2021 8:52 AM

## 2021-03-04 NOTE — Progress Notes (Signed)
Hematology/Oncology Consult note [facility]  [phone] Fax:([phone]  Patient Care Team: Rutherford Limerick, PA as PCP - General (Physician Assistant) Lequita Asal, MD (Inactive) as Referring Physician (Hematology and Oncology) Margaretha Sheffield, MD (Otolaryngology)   Name of the patient: Kent [name]  [id number]  1955-03-20   Date of visit: 03/04/21  Diagnosis-recurrent locally advanced base of tongue carcinoma  Chief complaint/ Reason for visit-discuss PET CT scan results and further management  Heme/Onc history: Patient is a 66 year old male who has seen Dr. Mike Gip in the past.  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  Tumor was p16 positive.  He received concurrent chemoradiation with 3 cycles of cisplatin.  He has had a gradually progressive infiltrative malignancy since then PD-L1 was checked on his original biopsy specimen with a CPS score of 1.  No reportable actionable mutations.  Carbo 5-FU Keytruda regimen was discussed by Dr. Mike Gip back in September 2021 but patient ultimately did notFollow-up.  More recently patient was noted to have more pain in his neck and underwent a repeat PET scan which shows extensive progression of infiltrative and nodular tumor involving the left neck along with internal jugular adenopathy.  Invasion of spinal canal at C1-C2 level with extensive muscle involvement and abutment of skull base.  Fracture of the left transverse process of C1.  Borderline low-grade activity noted into lung nodule 7 mm in size but otherwise no evidence of distant metastatic disease patient has met with radiation oncology and will be starting palliative radiation treatment next week.  He is here to discuss systemic options  Interval history-patient is here with his cousin today.Currently he is on Norco every 6 hours as needed for his pain.  He is following up with palliative care for  the same as well.  Reports that he does not have any significant difficulty swallowing.  Appetite and weight have remained stable  ECOG PS- 2 Pain scale- 4 Opioid associated constipation- no  Review of systems- Review of Systems  Constitutional:  Positive for malaise/fatigue.  HENT:         Neck pain      No Known Allergies   Past Medical History:  Diagnosis Date   Arthritis    Diabetes mellitus without complication ([place])    Hyperlipidemia    Hypertension    Obesity    Osteomyelitis ([place])    From pt chart   Renal insufficiency    CKD noted on chart   Staph infection 05/29/2016   Nov 2017. Staph infection in LT shoulder requiring debridement and grafts   Status post debridement    Stomach cancer ([place])    had tumor removed- [facility]   Tongue cancer ([place])    Stage IV squamous cell carcinoma of the base of the tongue/left tonsil     Past Surgical History:  Procedure Laterality Date   EXCISION OF TONGUE LESION Left 06/19/2016   Procedure: EXCISION OF TONGUE LESION;  Surgeon: Margaretha Sheffield, MD;  Location: [place];  Service: ENT;  Laterality: Left;  diabetic - insulin and oral meds - not using either currently   gastric tumor removed  2010   I & D EXTREMITY Left 01/09/2016   Procedure: IRRIGATION AND DEBRIDEMENT EXTREMITY;  Surgeon: Robert Bellow, MD;  Location: [facility];  Service: General;  Laterality: Left;   LARYNGOSCOPY N/A 06/19/2016   Procedure: LARYNGOSCOPY;  Surgeon: Margaretha Sheffield, MD;  Location: [facility];  Service: ENT;  Laterality: N/A;   PERIPHERAL VASCULAR CATHETERIZATION Right 12/31/2015   Procedure: Lower Extremity Angiography;  Surgeon: Algernon Huxley, MD;  Location: [place] [facility];  Service: Cardiovascular;  Laterality: Right;   tongue mass biopsy      Social History   Socioeconomic History   Marital status: Married    Spouse name: Not on file   Number of children: Not on file   Years of education: Not on file   Highest education  level: Not on file  Occupational History   Not on file  Tobacco Use   Smoking status: Never   Smokeless tobacco: Never  Vaping Use   Vaping Use: Never used  Substance and Sexual Activity   Alcohol use: No    Alcohol/week: 0.0 standard drinks   Drug use: No   Sexual activity: Not on file  Other Topics Concern   Not on file  Social History Narrative   Lives at home with wife. Ambulates independently.   Social Determinants of Health   Financial Resource Strain: Not on file  Food Insecurity: Not on file  Transportation Needs: Not on file  Physical Activity: Not on file  Stress: Not on file  Social Connections: Not on file  Intimate Partner Violence: Not on file    Family History  Problem Relation Age of Onset   Heart disease Mother    Hypertension Father    Cancer Sister        stomach   Heart disease Brother        MI   Cancer Daughter    Cancer Son        testicular   ADD / ADHD Sister      Current Outpatient Medications:    amLODipine (NORVASC) 10 MG tablet, Take 10 mg by mouth daily., Disp: , Rfl:    aspirin EC 81 MG EC tablet, Take 1 tablet (81 mg total) by mouth daily., Disp:  , Rfl:    atorvastatin (LIPITOR) 40 MG tablet, Take 1 tablet (40 mg total) by mouth daily., Disp:  , Rfl:    dexamethasone (DECADRON) 4 MG tablet, Take 1 tablet (4 mg total) by mouth daily., Disp: 60 tablet, Rfl: 1   HYDROcodone-acetaminophen (NORCO) 5-325 MG tablet, Take 1 tablet by mouth every 6 (six) hours as needed for moderate pain., Disp: 30 tablet, Rfl: 0   LANTUS SOLOSTAR 100 UNIT/ML Solostar Pen, Inject into the skin., Disp: , Rfl:    levothyroxine (SYNTHROID) 25 MCG tablet, Take 25 mcg by mouth daily., Disp: , Rfl:    losartan (COZAAR) 50 MG tablet, Take 50 mg by mouth daily., Disp: , Rfl:    polyethylene glycol (MIRALAX / GLYCOLAX) 17 g packet, Take 17 g by mouth 2 (two) times daily. (Patient not taking: Reported on 11/02/2019), Disp: 14 each, Rfl: 0  Physical exam:  Vitals:    03/01/21 1103  BP: 109/70  Pulse: 89  Resp: 20  Temp: 99 F (37.2 C)  TempSrc: Tympanic  SpO2: 100%  Weight: 159 lb 3.2 oz (72.2 kg)   Physical Exam Constitutional:      General: He is not in acute distress.    Comments: Sitting in a wheelchair  Cardiovascular:     Rate and Rhythm: Normal rate and regular rhythm.     Heart sounds: Normal heart sounds.  Pulmonary:     Effort: Pulmonary effort is normal.     Breath sounds: Normal breath sounds.  Abdominal:  General: Bowel sounds are normal.     Palpations: Abdomen is soft.  Skin:    General: Skin is warm and dry.  Neurological:     Mental Status: He is alert and oriented to person, place, and time.     CMP Latest Ref Rng & Units 03/01/2021  Glucose 70 - 99 mg/dL 107(H)  BUN 8 - 23 mg/dL 14  Creatinine 0.61 - 1.24 mg/dL 0.83  Sodium 135 - 145 mmol/L 129(L)  Potassium 3.5 - 5.1 mmol/L 3.2(L)  Chloride 98 - 111 mmol/L 89(L)  CO2 22 - 32 mmol/L 30  Calcium 8.9 - 10.3 mg/dL 8.5(L)  Total Protein 6.5 - 8.1 g/dL 7.0  Total Bilirubin 0.3 - 1.2 mg/dL 1.0  Alkaline Phos 38 - 126 U/L 96  AST 15 - 41 U/L 16  ALT 0 - 44 U/L 15   CBC Latest Ref Rng & Units 03/01/2021  WBC 4.0 - 10.5 K/uL 12.7(H)  Hemoglobin 13.0 - 17.0 g/dL 13.5  Hematocrit 39.0 - 52.0 % 39.7  Platelets 150 - 400 K/uL 312    No images are attached to the encounter.  NM PET Image Restag (PS) Skull Base To Thigh  Result Date: 02/22/2021 CLINICAL DATA:  Subsequent treatment strategy for carcinoma of the tongue base. EXAM: NUCLEAR MEDICINE PET SKULL BASE TO THIGH TECHNIQUE: 8.9 mCi F-18 FDG was injected intravenously. Full-ring PET imaging was performed from the skull base to thigh after the radiotracer. CT data was obtained and used for attenuation correction and anatomic localization. Fasting blood glucose: 71 mg/dl COMPARISON:  Multiple exams, including 08/10/2019 FINDINGS: Mediastinal blood pool activity: SUV max 2.3 Liver activity: SUV max NA NECK:  Extensive multifocal infiltrative malignancy in the left neck musculature, notably the left scalene musculature, left longissimus cervicis, left levator scapulae, left obliquus capitis, left rectus capitis, the posterior belly of the digastric, and tracking into the longus capitis muscular region to extend across midline in the vicinity of the posterior nasopharynx. This extends to the margin of the left occipital skull although gross penetration into the skull is not well shown. There is tumor extending up into the left semispinalis capitis muscle towards the occiput, and infiltrative tumor involving the left sternocleidomastoid muscle with obliteration of normal fat planes and tissue planes along the internal jugular space. The left internal jugular space infiltrative process has a maximum SUV of 7.4, previously 6.9, and has substantially worsened compared to the prior PET-CT. Along the lower lateral margin of the posterior scalene muscle, a 3.6 by 2.7 cm mass has a maximum SUV of 13.1. The left upper cervical paraspinal tumor has representative SUV of about 10.5, and is shown to extend into the left side of the spinal canal in the epidural space at the C1-2 level causing some rightward deviation of the cord as shown on image 23 series 3. There is also a rounded tumor deposit measuring about 2.2 by 2.4 cm on image 21 of series 3 in the RIGHT (contralateral) upper neck paraspinal musculature in the vicinity of the right rectus capitis posterior muscle, maximum SUV 9.0. An indistinctly marginated left level III lymph node measuring about 2.0 cm in short axis on image 49 series 3 has a maximum SUV of 10.6. A left level IV lymph node is present measuring 1.3 cm in short axis on image sixty of series 3 with maximum SUV 9.3. There is an immediately adjacent supraclavicular lymph node measuring 1.4 cm in short axis on image 67 of series 3 with maximum  SUV 12.2. A right level IIb lymph node measuring 0.7 cm in short axis  on image 29 series 3 has a maximum SUV of 3.3. Reduced intracranial metabolic activity in the right temporal lobe with associated adjacent dilation of the temporal horn of the right lateral ventricle. Asymmetric activity of the vocal cords, the right cord having a maximum SUV of 3.5 and the left having a maximum SUV of 2.7, cannot exclude left vocal cord paralysis as a potential cause. There is bony discontinuity in the left transverse process of C1 extending into the transverse foramen on image 25 of series 3 compatible with fracture, with mild sclerosis in the left transverse process. Tumor invasion and/or radiation therapy in the region may be contributory or causative. If there is concern regarding patency of the left vertebral artery, CTA or ultrasound could be utilized. Please note that tumor on the left side is in close proximity to left skull base foramina, and extension of tumor into or through the foramina is difficult to confidently exclude on today's PET-CT. Incidental CT findings: Mild chronic left maxillary sinusitis. CHEST: There is a hypermetabolic left supraclavicular lymph node but this is discussed above in the neck section given its proximity to adjacent level IV adenopathy. A left upper lobe subpleural nodule measuring 9 by 5 mm on image 104 of series 3 has a maximum SUV of 1.5, but is only borderline for sensitive PET-CT assessment. This nodule was not appreciable on 12/23/2016. An 8 by 6 mm lingular nodule on image 119 of series 3 has a maximum SUV of 1.7, and is likewise borderline for sensitive PET-CT size assessment. This nodule was not present on 12/23/2016. A 4 mm left lower lobe nodule shown on image 116 series 3 previously measured 3 mm in diameter on 12/23/2016, and is not hypermetabolic. My goal Incidental CT findings: Coronary, aortic arch, and branch vessel atherosclerotic vascular disease. Airway thickening and airway plugging in both lower lobes with some scattered ground-glass  opacities in the lower lobes. A 5 mm right lower lobe pulmonary nodule on image 115 of series 3 is not readily apparent on the 12/23/2016 exam, and not appreciably hypermetabolic. However, some of the ground-glass density in the right lower lobe is somewhat hypermetabolic including a 1 cm confluence of ground-glass density medially in the right lower lobe on image 119 of series 3 with maximum SUV of 2.7. Based on the density of this process I favor it as being inflammatory, but surveillance may be warranted. ABDOMEN/PELVIS: No significant abnormal hypermetabolic activity in this region. Incidental CT findings: Right kidney upper pole photopenic cyst. Atherosclerosis is present, including aortoiliac atherosclerotic disease. Sigmoid colon diverticulosis. Heterotopic ossification in the midline upper abdomen anteriorly measuring 5.7 cm in length. SKELETON: Tumor invasion of the left side of the spinal canal at the C1-2 level and fracture of the left C1 transverse process as noted in the neck section above. No further findings of osseous tumor identified. Incidental CT findings: Degenerative glenohumeral arthropathy on the right. Dextroconvex lumbar scoliosis. IMPRESSION: 1. Extensive progression of infiltrative and nodular tumor involvement of the left neck and to a lesser extent at the right neck as discussed in detail above. This includes internal jugular adenopathy, extensive muscle involvement, invasion of the spinal canal at the C1-2 level, and abutment of the skull base in the vicinity of the skull-base foramina such that foraminal invasion cannot be readily excluded although no overt bony destruction of the skull is currently appreciable. Tumor extends primarily down the left  neck to the level of the thoracic inlet and is associated with lower neck adenopathy and infiltrative tumor in the upper internal jugular space. 2. Fracture left transverse process at C1 extending into the transverse foramen, radiation  therapy and/or tumor may be contributory. If there is clinical concern regarding patency of the left vertebral artery, CT angiography of the neck could be employed. 3. Two nodules in the left lung average about 7 mm in diameter and demonstrate only low-grade activity, but these are at the borderline of accurate characterization by PET-CT and surveillance is recommended to exclude metastatic disease. 4. Bilateral lower lobe airway thickening with some plugging and ground-glass opacities. In the right lower lobe some of the ground-glass opacity has accentuated metabolic activity but I still favor this as being inflammatory, surveillance suggested. 5. Other imaging findings of potential clinical significance: Mild chronic left maxillary sinusitis. Aortic Atherosclerosis (ICD10-I70.0). Coronary atherosclerosis. Sigmoid colon diverticulosis. Dextroconvex lumbar scoliosis. Electronically Signed   By: Van Clines M.D.   On: 02/22/2021 14:00     Assessment and plan- Patient is a 66 y.o. 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  He is not a surgical candidate.  He is receiving palliative radiation treatment.  Patient is not interested in carrying a chemo pump.  I would recommend 6 cycles of carboplatinum along with Taxol and Keytruda given IV every 3 weeks for up to 6 cycles if tolerated.  Following that he will remain on maintenance Keytruda.  Discussed risk and benefit of chemotherapy including all but not limited to nausea, vomiting, low blood counts, risk of infections and hospitalizations.  Risk  of immunotherapy associated side effects including autoimmune thyroiditis hepatitis colitis endocrinopathies and pneumonitis associated with Keytruda.  Treatment will be given with a palliative intent.  Patient understands and agrees to proceed as planned.  We will plan for port placement and tentatively start chemotherapy in a week's time.  We will also refer him to nutrition counseling   Cancer Staging  Carcinoma of base of tongue ([place]) Staging form: Lip and Oral Cavity, AJCC 7th Edition - Clinical stage from 09/10/2015: Stage IVB (T2, N3, M0) - Unsigned Staged by: Managing physician Diagnostic confirmation: Positive histology Specimen type: Core Needle Biopsy Histopathologic type: Carcinoma, NOS Laterality: Left Location of involved lymph nodes: Level II, Level III, Level IV Extracapsular spread (ECS): No Human papillomavirus (HPV) status: Positive high risk Stage used in treatment planning: Yes National guidelines used in treatment planning: Yes Type of national guideline used in treatment planning: NCCN     Visit Diagnosis 1. Carcinoma of base of tongue ([place])   2. Goals of care, counseling/discussion      Dr. Randa Evens, MD, MPH [facility] [phone] 03/04/2021 8:52 AM

## 2021-03-04 NOTE — Progress Notes (Signed)
START OFF PATHWAY REGIMEN - Head and Neck   [id number]:Carboplatin AUC=6 IV D1 + Paclitaxel 200 mg/m2 IV D1 + Pembrolizumab 200 mg IV D1 q21 Days x 4 Cycles:   A cycle is every 21 days:     Pembrolizumab      Paclitaxel      Carboplatin   **Always confirm dose/schedule in your pharmacy ordering system**  Patient Characteristics: Oropharynx, HPV Positive, Metastatic, First Line, No Prior Platinum-Based Chemoradiation within 6 Months, PD-L1 Expression Positive (CPS ? 1) Disease Classification: Oropharynx HPV Status: Positive (+) Therapeutic Status: Metastatic Disease Line of Therapy: First Line Prior Platinum Status: No Prior Platinum-Based Chemoradiation within 6 Months PD-L1 Expression Status: PD-L1 Positive (CPS ? 1) Intent of Therapy: Non-Curative / Palliative Intent, Discussed with Patient

## 2021-03-04 NOTE — Progress Notes (Signed)
Patient on plan of care prior to pathways. 

## 2021-03-05 ENCOUNTER — Telehealth: Payer: Self-pay | Admitting: *Deleted

## 2021-03-05 NOTE — Progress Notes (Signed)
Pharmacist Chemotherapy Monitoring - Initial Assessment    Anticipated start date: 03/11/21   The following has been reviewed per standard work regarding the patient's treatment regimen: The patient's diagnosis, treatment plan and drug doses, and organ/hematologic function Lab orders and baseline tests specific to treatment regimen  The treatment plan start date, drug sequencing, and pre-medications Prior authorization status  Patient's documented medication list, including drug-drug interaction screen and prescriptions for anti-emetics and supportive care specific to the treatment regimen The drug concentrations, fluid compatibility, administration routes, and timing of the medications to be used The patient's access for treatment and lifetime cumulative dose history, if applicable  The patient's medication allergies and previous infusion related reactions, if applicable   Changes made to treatment plan:  treatment plan date  Follow up needed:  signing treatment plan   Adelina Mings, [facility], 03/05/2021  12:38 PM

## 2021-03-05 NOTE — Telephone Encounter (Signed)
Radiation therapists received a call from the patients sister that he does not want radiation but he stills wants to receive chemotherapy.   Call returned to sister at [phone] to verify this information.   VM left asking for a return call.

## 2021-03-05 NOTE — Telephone Encounter (Signed)
Sister Butch Penny returned call and Mr. Mckone doesn't feel like he can tolerate the radiation at this time but he does plan on continuing with the chemotherapy.   Dr. Baruch Gouty is aware, Dr. Elroy Channel team will be notified.

## 2021-03-06 ENCOUNTER — Inpatient Hospital Stay: Payer: Medicare Other | Attending: Hospice and Palliative Medicine

## 2021-03-06 ENCOUNTER — Other Ambulatory Visit (INDEPENDENT_AMBULATORY_CARE_PROVIDER_SITE_OTHER): Payer: Self-pay | Admitting: Nurse Practitioner

## 2021-03-06 ENCOUNTER — Other Ambulatory Visit: Payer: Self-pay

## 2021-03-06 DIAGNOSIS — R519 Headache, unspecified: Secondary | ICD-10-CM | POA: Insufficient documentation

## 2021-03-06 DIAGNOSIS — E119 Type 2 diabetes mellitus without complications: Secondary | ICD-10-CM | POA: Insufficient documentation

## 2021-03-06 DIAGNOSIS — Z5112 Encounter for antineoplastic immunotherapy: Secondary | ICD-10-CM | POA: Insufficient documentation

## 2021-03-06 DIAGNOSIS — Z7982 Long term (current) use of aspirin: Secondary | ICD-10-CM | POA: Insufficient documentation

## 2021-03-06 DIAGNOSIS — E876 Hypokalemia: Secondary | ICD-10-CM | POA: Insufficient documentation

## 2021-03-06 DIAGNOSIS — G893 Neoplasm related pain (acute) (chronic): Secondary | ICD-10-CM | POA: Insufficient documentation

## 2021-03-06 DIAGNOSIS — C01 Malignant neoplasm of base of tongue: Secondary | ICD-10-CM | POA: Insufficient documentation

## 2021-03-06 DIAGNOSIS — Z5189 Encounter for other specified aftercare: Secondary | ICD-10-CM | POA: Insufficient documentation

## 2021-03-06 DIAGNOSIS — Z5111 Encounter for antineoplastic chemotherapy: Secondary | ICD-10-CM | POA: Insufficient documentation

## 2021-03-06 DIAGNOSIS — E861 Hypovolemia: Secondary | ICD-10-CM | POA: Insufficient documentation

## 2021-03-06 DIAGNOSIS — Z79899 Other long term (current) drug therapy: Secondary | ICD-10-CM | POA: Insufficient documentation

## 2021-03-06 DIAGNOSIS — I10 Essential (primary) hypertension: Secondary | ICD-10-CM | POA: Insufficient documentation

## 2021-03-06 MED ORDER — GENTAMICIN SULFATE 40 MG/ML IJ SOLN: 80.0000 mg | Freq: Once | INTRAMUSCULAR | Status: DC

## 2021-03-06 NOTE — Progress Notes (Signed)
Nutrition Assessment   Reason for Assessment:   Referral recurrent base of tongue cancer.    ASSESSMENT:  66 year old male with recurrent base of tongue cancer.  Past medical history DM, HTN, HLD.  Planning to start chemotherapy. Has refused radiation.   Met with patient and cousin, Jeneen Rinks.  Patient lives with sister, Butch Penny.  Patient does not drive.  Sister prepares meals or patient walks to local grill for food.  Breakfast maybe bacon, egg sandwich or grilled cheese. Lunch maybe cheese dog or hot dog. Dinner last night was fish, slaw and hushpuppies.  Does not like ensure/boost shakes.  Drinks [employer] drinks, water and green tea.  Says that he does not have any teeth.  Reports slight trouble swallowing foods that are dry.   Medications:  Dexamethasone, lantus, miralax, zofran, compazine   Labs: reviewed   Anthropometrics:   Height: 67 inches Weight: 159 lb 3.2 oz UBW: 180s in 11/2019 BMI: 24  12% weight loss in the last year and 8 months   Estimated Energy Needs  Kcals: 2100-2500 Protein: 105-125 g Fluid: 2.1 L   NUTRITION DIAGNOSIS: Unintentional weight loss related to recurrent cancer as evidenced by 12% weight loss in the last year and 9 months   INTERVENTION:  Discussed importance of high calorie, high protein foods.  Discussed soft foods, high in protein. Handout provided Contact information provided Cousin with questions regarding port placement tomorrow.  Discussed with nursing that patient should be at the [place] [facility] information given   MONITORING, EVALUATION, GOAL: weight trends, intake   Next Visit: Wednesday, Jan 18 telephone call with sister [phone])  Niesha Bame B. Zenia Resides, [place], [place] Registered Dietitian [phone] (mobile)

## 2021-03-07 ENCOUNTER — Ambulatory Visit
Admission: RE | Admit: 2021-03-07 | Discharge: 2021-03-07 | Disposition: A | Discharge status: Home / Self Care | Payer: Medicare Other | Attending: Vascular Surgery | Admitting: Vascular Surgery

## 2021-03-07 ENCOUNTER — Other Ambulatory Visit: Payer: Medicare Other

## 2021-03-07 ENCOUNTER — Other Ambulatory Visit: Payer: Self-pay

## 2021-03-07 ENCOUNTER — Encounter: Payer: Self-pay | Admitting: Vascular Surgery

## 2021-03-07 ENCOUNTER — Encounter
Admission: RE | Disposition: A | Discharge status: Home / Self Care | Payer: Self-pay | Source: Home / Self Care | Attending: Vascular Surgery

## 2021-03-07 DIAGNOSIS — C01 Malignant neoplasm of base of tongue: Secondary | ICD-10-CM | POA: Diagnosis present

## 2021-03-07 DIAGNOSIS — C029 Malignant neoplasm of tongue, unspecified: Secondary | ICD-10-CM

## 2021-03-07 DIAGNOSIS — N189 Chronic kidney disease, unspecified: Secondary | ICD-10-CM | POA: Insufficient documentation

## 2021-03-07 DIAGNOSIS — I129 Hypertensive chronic kidney disease with stage 1 through stage 4 chronic kidney disease, or unspecified chronic kidney disease: Secondary | ICD-10-CM | POA: Diagnosis not present

## 2021-03-07 DIAGNOSIS — E1122 Type 2 diabetes mellitus with diabetic chronic kidney disease: Secondary | ICD-10-CM | POA: Insufficient documentation

## 2021-03-07 HISTORY — PX: PORTA CATH INSERTION: CATH118285

## 2021-03-07 LAB — GLUCOSE, CAPILLARY
Glucose-Capillary: 83 mg/dL (ref 70–99)
Glucose-Capillary: 104 mg/dL — ABNORMAL HIGH (ref 70–99)

## 2021-03-07 SURGERY — PORTA CATH INSERTION
Anesthesia: Moderate Sedation

## 2021-03-07 MED ORDER — ONDANSETRON HCL 4 MG/2ML IJ SOLN: 4.0000 mg | Freq: Four times a day (QID) | INTRAMUSCULAR | Status: DC | PRN

## 2021-03-07 MED ORDER — METHYLPREDNISOLONE SODIUM SUCC 125 MG IJ SOLR: 125.0000 mg | Freq: Once | INTRAMUSCULAR | Status: DC | PRN

## 2021-03-07 MED ORDER — HEPARIN SODIUM (PORCINE) 1000 UNIT/ML IJ SOLN
INTRAMUSCULAR | Status: AC
  Filled 2021-03-07: qty 10

## 2021-03-07 MED ORDER — SODIUM CHLORIDE 0.9 % IV SOLN
80.0000 mg | Freq: Once | INTRAVENOUS | Status: DC
  Filled 2021-03-07: qty 2

## 2021-03-07 MED ORDER — FAMOTIDINE 20 MG PO TABS: 40.0000 mg | ORAL_TABLET | Freq: Once | ORAL | Status: DC | PRN

## 2021-03-07 MED ORDER — MIDAZOLAM HCL 2 MG/2ML IJ SOLN
INTRAMUSCULAR | Status: DC | PRN
  Administered 2021-03-07: 2 mg via INTRAVENOUS

## 2021-03-07 MED ORDER — MIDAZOLAM HCL 2 MG/ML PO SYRP: 8.0000 mg | ORAL_SOLUTION | Freq: Once | ORAL | Status: DC | PRN

## 2021-03-07 MED ORDER — FENTANYL CITRATE (PF) 100 MCG/2ML IJ SOLN
INTRAMUSCULAR | Status: DC | PRN
  Administered 2021-03-07: 50 ug via INTRAVENOUS

## 2021-03-07 MED ORDER — CEFAZOLIN SODIUM-DEXTROSE 2-4 GM/100ML-% IV SOLN
2.0000 g | Freq: Once | INTRAVENOUS | Status: AC
  Administered 2021-03-07: 2 g via INTRAVENOUS

## 2021-03-07 MED ORDER — CHLORHEXIDINE GLUCONATE CLOTH 2 % EX PADS
6.0000 | MEDICATED_PAD | Freq: Every day | CUTANEOUS | Status: DC
  Administered 2021-03-07: 6 via TOPICAL

## 2021-03-07 MED ORDER — HYDROMORPHONE HCL 1 MG/ML IJ SOLN: 1.0000 mg | Freq: Once | INTRAMUSCULAR | Status: DC | PRN

## 2021-03-07 MED ORDER — DIPHENHYDRAMINE HCL 50 MG/ML IJ SOLN: 50.0000 mg | Freq: Once | INTRAMUSCULAR | Status: DC | PRN

## 2021-03-07 MED ORDER — SODIUM CHLORIDE 0.9 % IV SOLN: INTRAVENOUS | Status: DC

## 2021-03-07 MED ORDER — FENTANYL CITRATE PF 50 MCG/ML IJ SOSY
PREFILLED_SYRINGE | INTRAMUSCULAR | Status: AC
  Filled 2021-03-07: qty 2

## 2021-03-07 MED ORDER — MIDAZOLAM HCL 2 MG/2ML IJ SOLN
INTRAMUSCULAR | Status: AC
  Filled 2021-03-07: qty 4

## 2021-03-07 MED ORDER — CEFAZOLIN SODIUM-DEXTROSE 2-4 GM/100ML-% IV SOLN
INTRAVENOUS | Status: AC
  Filled 2021-03-07: qty 100

## 2021-03-07 SURGICAL SUPPLY — 12 items
COVER PROBE U/S 5X48 (MISCELLANEOUS) ×1 IMPLANT
COVER SURGICAL LIGHT HANDLE (MISCELLANEOUS) ×1 IMPLANT
DERMABOND ADVANCED (GAUZE/BANDAGES/DRESSINGS) ×1
DERMABOND ADVANCED .7 DNX12 (GAUZE/BANDAGES/DRESSINGS) IMPLANT
HANDLE YANKAUER SUCT BULB TIP (MISCELLANEOUS) ×1 IMPLANT
KIT PORT POWER 8FR ISP CVUE (Port) ×1 IMPLANT
PACK ANGIOGRAPHY (CUSTOM PROCEDURE TRAY) ×2 IMPLANT
SPONGE XRAY 4X4 16PLY STRL (MISCELLANEOUS) ×1 IMPLANT
SUT MNCRL AB 4-0 PS2 18 (SUTURE) ×1 IMPLANT
SUT VIC AB 3-0 SH 27 (SUTURE) ×1
SUT VIC AB 3-0 SH 27X BRD (SUTURE) IMPLANT
TUBING CONNECTING 10 (TUBING) ×4 IMPLANT

## 2021-03-07 NOTE — Op Note (Signed)
°      [facility]       Operative Note  Date: 03/07/2021  Preoperative diagnosis:  1. Tongue cancer  Postoperative diagnosis:  Same as above  Procedures: #1. Ultrasound guidance for vascular access to the right internal jugular vein. #2. Fluoroscopic guidance for placement of catheter. #3. Placement of CT compatible Port-A-Cath, right internal jugular vein.  Surgeon: Leotis Pain, MD.   Anesthesia: Local with moderate conscious sedation for approximately 23  minutes using 2 mg of Versed and 50 mcg of Fentanyl  Fluoroscopy time: less than 1 minute  Contrast used: 0  Estimated blood loss: 3 cc  Indication for the procedure:  The patient is a 66 y.o.male with tongue cancer.  The patient needs a Port-A-Cath for durable venous access, chemotherapy, lab draws, and CT scans. We are asked to place this. Risks and benefits were discussed and informed consent was obtained.  Description of procedure: The patient was brought to the vascular and interventional radiology suite.  Moderate conscious sedation was administered throughout the procedure during a face to face encounter with the patient with my supervision of the RN administering medicines and monitoring the patient's vital signs, pulse oximetry, telemetry and mental status throughout from the start of the procedure until the patient was taken to the recovery room. The right neck chest and shoulder were sterilely prepped and draped, and a sterile surgical field was created. Ultrasound was used to help visualize a patent right internal jugular vein. This was then accessed under direct ultrasound guidance without difficulty with the Seldinger needle and a permanent image was recorded. A J-wire was placed. After skin nick and dilatation, the peel-away sheath was then placed over the wire. I then anesthetized an area under the clavicle approximately 1-2 fingerbreadths. A transverse incision was created and an inferior pocket was  created with electrocautery and blunt dissection. The port was then brought onto the field, placed into the pocket and secured to the chest wall with 2 Prolene sutures. The catheter was connected to the port and tunneled from the subclavicular incision to the access site. Fluoroscopic guidance was then used to cut the catheter to an appropriate length. The catheter was then placed through the peel-away sheath and the peel-away sheath was removed. The catheter tip was parked in excellent location under fluorocoscopic guidance in the cavoatrail junction. The pocket was then irrigated with antibiotic impregnated saline and the wound was closed with a running 3-0 Vicryl and a 4-0 Monocryl. The access incision was closed with a single 4-0 Monocryl. The Huber needle was used to withdraw blood and flush the port with heparinized saline. Dermabond was then placed as a dressing. The patient tolerated the procedure well and was taken to the recovery room in stable condition.   Leotis Pain 03/07/2021 10:25 AM   This note was created with Dragon Medical transcription system. Any errors in dictation are purely unintentional.

## 2021-03-07 NOTE — Interval H&P Note (Signed)
History and Physical Interval Note:  03/07/2021 9:33 AM  Kent [name]  has presented today for surgery, with the diagnosis of Porta Cath Placement   Tongue Ca.  The various methods of treatment have been discussed with the patient and family. After consideration of risks, benefits and other options for treatment, the patient has consented to  Procedure(s): PORTA CATH INSERTION (N/A) as a surgical intervention.  The patient's history has been reviewed, patient examined, no change in status, stable for surgery.  I have reviewed the patient's chart and labs.  Questions were answered to the patient's satisfaction.     Leotis Pain

## 2021-03-11 ENCOUNTER — Inpatient Hospital Stay: Payer: Medicare Other

## 2021-03-11 ENCOUNTER — Other Ambulatory Visit: Payer: Self-pay

## 2021-03-11 ENCOUNTER — Ambulatory Visit: Payer: Medicare Other

## 2021-03-11 ENCOUNTER — Inpatient Hospital Stay (HOSPITAL_BASED_OUTPATIENT_CLINIC_OR_DEPARTMENT_OTHER): Payer: Medicare Other | Admitting: Oncology

## 2021-03-11 ENCOUNTER — Encounter: Payer: Self-pay | Admitting: Oncology

## 2021-03-11 VITALS — BP 129/87 | HR 70 | Resp 20

## 2021-03-11 VITALS — BP 88/65 | HR 83 | Temp 99.1°F | Resp 16 | Ht 67.0 in | Wt 158.7 lb

## 2021-03-11 DIAGNOSIS — I10 Essential (primary) hypertension: Secondary | ICD-10-CM | POA: Diagnosis not present

## 2021-03-11 DIAGNOSIS — Z5111 Encounter for antineoplastic chemotherapy: Secondary | ICD-10-CM | POA: Diagnosis present

## 2021-03-11 DIAGNOSIS — E876 Hypokalemia: Secondary | ICD-10-CM

## 2021-03-11 DIAGNOSIS — C01 Malignant neoplasm of base of tongue: Secondary | ICD-10-CM | POA: Diagnosis not present

## 2021-03-11 DIAGNOSIS — G893 Neoplasm related pain (acute) (chronic): Secondary | ICD-10-CM | POA: Diagnosis not present

## 2021-03-11 DIAGNOSIS — Z79899 Other long term (current) drug therapy: Secondary | ICD-10-CM | POA: Diagnosis not present

## 2021-03-11 DIAGNOSIS — Z5189 Encounter for other specified aftercare: Secondary | ICD-10-CM | POA: Diagnosis not present

## 2021-03-11 DIAGNOSIS — Z7982 Long term (current) use of aspirin: Secondary | ICD-10-CM | POA: Diagnosis not present

## 2021-03-11 DIAGNOSIS — R946 Abnormal results of thyroid function studies: Secondary | ICD-10-CM

## 2021-03-11 DIAGNOSIS — E861 Hypovolemia: Secondary | ICD-10-CM

## 2021-03-11 DIAGNOSIS — Z5112 Encounter for antineoplastic immunotherapy: Secondary | ICD-10-CM | POA: Diagnosis not present

## 2021-03-11 DIAGNOSIS — E119 Type 2 diabetes mellitus without complications: Secondary | ICD-10-CM | POA: Diagnosis not present

## 2021-03-11 DIAGNOSIS — Z7189 Other specified counseling: Secondary | ICD-10-CM

## 2021-03-11 DIAGNOSIS — R519 Headache, unspecified: Secondary | ICD-10-CM | POA: Diagnosis not present

## 2021-03-11 DIAGNOSIS — I9589 Other hypotension: Secondary | ICD-10-CM

## 2021-03-11 LAB — CBC WITH DIFFERENTIAL/PLATELET
Abs Immature Granulocytes: 0.05 10*3/uL (ref 0.00–0.07)
Basophils Absolute: 0 10*3/uL (ref 0.0–0.1)
Basophils Relative: 0 %
Eosinophils Absolute: 0 10*3/uL (ref 0.0–0.5)
Eosinophils Relative: 0 %
HCT: 35.8 % — ABNORMAL LOW (ref 39.0–52.0)
Hemoglobin: 12.2 g/dL — ABNORMAL LOW (ref 13.0–17.0)
Immature Granulocytes: 0 %
Lymphocytes Relative: 8 %
Lymphs Abs: 1 10*3/uL (ref 0.7–4.0)
MCH: 28.6 pg (ref 26.0–34.0)
MCHC: 34.1 g/dL (ref 30.0–36.0)
MCV: 84 fL (ref 80.0–100.0)
Monocytes Absolute: 0.9 10*3/uL (ref 0.1–1.0)
Monocytes Relative: 7 %
Neutro Abs: 10.3 10*3/uL — ABNORMAL HIGH (ref 1.7–7.7)
Neutrophils Relative %: 85 %
Platelets: 300 10*3/uL (ref 150–400)
RBC: 4.26 MIL/uL (ref 4.22–5.81)
RDW: 13.9 % (ref 11.5–15.5)
WBC: 12.2 10*3/uL — ABNORMAL HIGH (ref 4.0–10.5)
nRBC: 0 % (ref 0.0–0.2)

## 2021-03-11 LAB — COMPREHENSIVE METABOLIC PANEL
ALT: 15 U/L (ref 0–44)
AST: 18 U/L (ref 15–41)
Albumin: 3.2 g/dL — ABNORMAL LOW (ref 3.5–5.0)
Alkaline Phosphatase: 102 U/L (ref 38–126)
Anion gap: 8 (ref 5–15)
BUN: 12 mg/dL (ref 8–23)
CO2: 29 mmol/L (ref 22–32)
Calcium: 8.2 mg/dL — ABNORMAL LOW (ref 8.9–10.3)
Chloride: 90 mmol/L — ABNORMAL LOW (ref 98–111)
Creatinine, Ser: 0.7 mg/dL (ref 0.61–1.24)
GFR, Estimated: >60 mL/min (ref >60)
Glucose, Bld: 149 mg/dL — ABNORMAL HIGH (ref 70–99)
Potassium: 2.9 mmol/L — ABNORMAL LOW (ref 3.5–5.1)
Sodium: 127 mmol/L — ABNORMAL LOW (ref 135–145)
Total Bilirubin: 0.7 mg/dL (ref 0.3–1.2)
Total Protein: 6.4 g/dL — ABNORMAL LOW (ref 6.5–8.1)

## 2021-03-11 LAB — TSH: TSH: 7.104 u[IU]/mL — ABNORMAL HIGH (ref 0.350–4.500)

## 2021-03-11 MED ORDER — SODIUM CHLORIDE 0.9% FLUSH
10.0000 mL | INTRAVENOUS | Status: DC | PRN
  Administered 2021-03-11 (×2): 10 mL via INTRAVENOUS
  Filled 2021-03-11: qty 10

## 2021-03-11 MED ORDER — HEPARIN SOD (PORK) LOCK FLUSH 100 UNIT/ML IV SOLN
INTRAVENOUS | Status: AC
  Filled 2021-03-11: qty 5

## 2021-03-11 MED ORDER — DIPHENHYDRAMINE HCL 50 MG/ML IJ SOLN
50.0000 mg | Freq: Once | INTRAMUSCULAR | Status: AC
  Administered 2021-03-11: 50 mg via INTRAVENOUS
  Filled 2021-03-11: qty 1

## 2021-03-11 MED ORDER — SODIUM CHLORIDE 0.9 % IV SOLN
10.0000 mg | Freq: Once | INTRAVENOUS | Status: AC
  Administered 2021-03-11: 10 mg via INTRAVENOUS
  Filled 2021-03-11: qty 10

## 2021-03-11 MED ORDER — PALONOSETRON HCL INJECTION 0.25 MG/5ML
0.2500 mg | Freq: Once | INTRAVENOUS | Status: AC
  Administered 2021-03-11: 0.25 mg via INTRAVENOUS
  Filled 2021-03-11: qty 5

## 2021-03-11 MED ORDER — SODIUM CHLORIDE 0.9 % IV SOLN
200.0000 mg | Freq: Once | INTRAVENOUS | Status: AC
  Administered 2021-03-11: 200 mg via INTRAVENOUS
  Filled 2021-03-11: qty 8

## 2021-03-11 MED ORDER — SODIUM CHLORIDE 0.9 % IV SOLN
495.5000 mg | Freq: Once | INTRAVENOUS | Status: AC
  Administered 2021-03-11: 500 mg via INTRAVENOUS
  Filled 2021-03-11: qty 50

## 2021-03-11 MED ORDER — SODIUM CHLORIDE 0.9 % IV SOLN
150.0000 mg | Freq: Once | INTRAVENOUS | Status: AC
  Administered 2021-03-11: 150 mg via INTRAVENOUS
  Filled 2021-03-11: qty 150

## 2021-03-11 MED ORDER — SODIUM CHLORIDE 0.9 % IV SOLN
Freq: Once | INTRAVENOUS | Status: AC
  Filled 2021-03-11: qty 250

## 2021-03-11 MED ORDER — HEPARIN SOD (PORK) LOCK FLUSH 100 UNIT/ML IV SOLN
500.0000 [IU] | Freq: Once | INTRAVENOUS | Status: AC
  Administered 2021-03-11: 500 [IU] via INTRAVENOUS
  Filled 2021-03-11: qty 5

## 2021-03-11 MED ORDER — FAMOTIDINE IN NACL 20-0.9 MG/50ML-% IV SOLN
20.0000 mg | Freq: Once | INTRAVENOUS | Status: AC
  Administered 2021-03-11: 20 mg via INTRAVENOUS
  Filled 2021-03-11: qty 50

## 2021-03-11 MED ORDER — POTASSIUM CHLORIDE IN NACL 20-0.9 MEQ/L-% IV SOLN
Freq: Once | INTRAVENOUS | Status: AC
  Administered 2021-03-11: 1000 mL via INTRAVENOUS
  Filled 2021-03-11: qty 1000

## 2021-03-11 MED ORDER — OXYCODONE HCL 5 MG PO TABS: 5.0000 mg | ORAL_TABLET | Freq: Four times a day (QID) | ORAL | 0 refills | Status: DC | PRN

## 2021-03-11 MED ORDER — SODIUM CHLORIDE 0.9 % IV SOLN
175.0000 mg/m2 | Freq: Once | INTRAVENOUS | Status: AC
  Administered 2021-03-11: 324 mg via INTRAVENOUS
  Filled 2021-03-11: qty 54

## 2021-03-11 NOTE — Progress Notes (Signed)
1828- Blood pressure: 88/65 during MD visit today. MD, Dr. Janese Banks, aware. Per MD order: patient to receive 0.9% Sodium Chloride with Potassium Chloride 20 mEq infusion prior to scheduled Keytruda, Taxol, and Carboplatin treatment today; MD placing order, see MAR. Per MD order: recheck blood pressure post 0.9% Sodium Chloride with Potassium Chloride 20 mEq infusion and notify MD for instructions on whether to proceed with scheduled Keytruda, Taxol, and Carboplatin treatment today.  1040- Blood pressure: 122/75 - Post 0.9% Sodium Chloride with Potassuim Chloride 20 mEq infusion. MD, Dr. Janese Banks, notified.  1052- Per MD order: okay to proceed with scheduled Keytruda, Taxol, and Carboplatin treatment at this time.

## 2021-03-11 NOTE — Addendum Note (Signed)
Addended by: Luella Cook on: 03/11/2021 10:17 AM   Modules accepted: Orders

## 2021-03-11 NOTE — Progress Notes (Signed)
[place] Work  Clinical Social Work was referred by B. Ellington RN, [facility] Triage for assessment of psychosocial needs.  Clinical Social Worker contacted caregiver by phone  to offer support and assess for needs.  Patient's main contact is his sister Joyice Faster [phone]. Ms. Tobin Chad stated she is the main contact due to the patient's difficulty communicating due to treatment and recent stroke. Ms Tobin Chad inquired about medicaid application for the patient.  Ms. Tobin Chad mentioned patient receives disability and 1280.00 monthly income  Ms. Tobin Chad stated their main concern was assisting patient with paying medical bills.  CSW referred Ms. Sharpe to [employer], Hotel manager for [facility], to discuss possible assistance.  CSW sent email with Ms. Sharpe's contact information to Hotel manager.  Ms, Tobin Chad stated she will call CSW with any other concerns or questions.      Adelene Amas, [place]

## 2021-03-11 NOTE — Progress Notes (Signed)
Pt felt dizzy when standing on the scales.  He is eting and drinking  good per pt. He drinks soda and water. He has cane and walker that he uses when needs it. He has cane all the time but today he got in staxi and did not use cane. 1-2 BM a week. Uses miralax when needed

## 2021-03-11 NOTE — Progress Notes (Signed)
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 Mike Gip in the past.  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  Tumor was p16 positive.  He received concurrent chemoradiation with 3 cycles of cisplatin.  He has had a gradually progressive infiltrative malignancy since then PD-L1 was checked on his original biopsy specimen with a CPS score of 1.  No reportable actionable mutations.  Carbo 5-FU Keytruda regimen was discussed by Dr. Mike Gip back in September 2021 but patient ultimately did notFollow-up.  More recently patient was noted to have more pain in his neck and underwent a repeat PET scan which shows extensive progression of infiltrative and nodular tumor involving the left neck along with internal jugular adenopathy.  Invasion of spinal canal at C1-C2 level with extensive muscle involvement and abutment of skull base.  Fracture of the left transverse process of C1.  Borderline low-grade activity noted into lung nodule 7 mm in size but otherwise no evidence of distant metastatic disease  He is here to discuss systemic options  He has declined radiation treatment given concern for toxicity but is agreeable to proceed with chemotherapy  Interval history-patient reports having pain in the left side of his face especially at night.  States that gabapentin has not been helping  him as much.  He is able to eat normally through his mouth.  Oral intake has been fair.  ECOG PS- 2 Pain scale- 4 Opioid associated constipation- no  Review of systems- Review of Systems  Constitutional:  Positive for malaise/fatigue. Negative for chills, fever and weight loss.  HENT:  Negative for congestion, ear discharge and nosebleeds.        Left-sided neck pain  Eyes:  Negative for blurred vision.  Respiratory:  Negative for cough, hemoptysis, sputum production, shortness of breath and wheezing.   Cardiovascular:  Negative for chest pain, palpitations, orthopnea and claudication.  Gastrointestinal:  Negative for abdominal pain, blood in stool, constipation, diarrhea, heartburn, melena, nausea and vomiting.  Genitourinary:  Negative for dysuria, flank pain, frequency, hematuria and urgency.  Musculoskeletal:  Negative for back pain, joint pain and myalgias.  Skin:  Negative for rash.  Neurological:  Negative for dizziness, tingling, focal weakness, seizures, weakness and headaches.  Endo/Heme/Allergies:  Does not bruise/bleed easily.  Psychiatric/Behavioral:  Negative for depression and suicidal ideas. The patient does not have insomnia.       No Known Allergies   Past Medical History:  Diagnosis Date   Arthritis    Diabetes mellitus without complication ([place])    Hyperlipidemia    Hypertension    Obesity    Osteomyelitis ([place])    From pt chart   Renal insufficiency    CKD noted on chart   Staph infection 05/29/2016   Nov 2017. Staph infection in LT shoulder requiring debridement and grafts   Status post debridement    Stomach cancer ([place])    had tumor removed- [facility]   Tongue  cancer ([place])    Stage IV squamous cell carcinoma of the base of the tongue/left tonsil     Past Surgical History:  Procedure Laterality Date   EXCISION OF TONGUE LESION Left 06/19/2016   Procedure: EXCISION OF TONGUE LESION;  Surgeon: Margaretha Sheffield, MD;  Location: [facility];  Service:  ENT;  Laterality: Left;  diabetic - insulin and oral meds - not using either currently   gastric tumor removed  2010   I & D EXTREMITY Left 01/09/2016   Procedure: IRRIGATION AND DEBRIDEMENT EXTREMITY;  Surgeon: Robert Bellow, MD;  Location: [facility];  Service: General;  Laterality: Left;   LARYNGOSCOPY N/A 06/19/2016   Procedure: LARYNGOSCOPY;  Surgeon: Margaretha Sheffield, MD;  Location: [facility];  Service: ENT;  Laterality: N/A;   PERIPHERAL VASCULAR CATHETERIZATION Right 12/31/2015   Procedure: Lower Extremity Angiography;  Surgeon: Algernon Huxley, MD;  Location: [facility];  Service: Cardiovascular;  Laterality: Right;   PORTA CATH INSERTION N/A 03/07/2021   Procedure: PORTA CATH INSERTION;  Surgeon: Algernon Huxley, MD;  Location: [facility];  Service: Cardiovascular;  Laterality: N/A;   tongue mass biopsy      Social History   Socioeconomic History   Marital status: Married    Spouse name: Not on file   Number of children: Not on file   Years of education: Not on file   Highest education level: Not on file  Occupational History   Not on file  Tobacco Use   Smoking status: Never   Smokeless tobacco: Never  Vaping Use   Vaping Use: Never used  Substance and Sexual Activity   Alcohol use: No    Alcohol/week: 0.0 standard drinks   Drug use: No   Sexual activity: Not on file  Other Topics Concern   Not on file  Social History Narrative   Lives at home with wife. Ambulates independently.   Social Determinants of Health   Financial Resource Strain: Not on file  Food Insecurity: Not on file  Transportation Needs: Not on file  Physical Activity: Not on file  Stress: Not on file  Social Connections: Not on file  Intimate Partner Violence: Not on file    Family History  Problem Relation Age of Onset   Heart disease Mother    Hypertension Father    Cancer Sister        stomach   Heart disease Brother        MI   Cancer Daughter    Cancer Son         testicular   ADD / ADHD Sister      Current Outpatient Medications:    amLODipine (NORVASC) 10 MG tablet, Take 10 mg by mouth daily., Disp: , Rfl:    aspirin EC 81 MG EC tablet, Take 1 tablet (81 mg total) by mouth daily., Disp:  , Rfl:    atorvastatin (LIPITOR) 40 MG tablet, Take 1 tablet (40 mg total) by mouth daily., Disp:  , Rfl:    dexamethasone (DECADRON) 4 MG tablet, Take 1 tablet (4 mg total) by mouth daily., Disp: 60 tablet, Rfl: 1   dexamethasone (DECADRON) 4 MG tablet, Take 2 tablets (8 mg total) by mouth daily. Start the day after carboplatin chemotherapy for 3 days., Disp: 30 tablet, Rfl: 1   HYDROcodone-acetaminophen (NORCO) 5-325 MG tablet, Take 1 tablet by mouth every 6 (six) hours as needed for moderate pain., Disp: 30 tablet, Rfl: 0  LANTUS SOLOSTAR 100 UNIT/ML Solostar Pen, Inject into the skin., Disp: , Rfl:    levothyroxine (SYNTHROID) 25 MCG tablet, Take 25 mcg by mouth daily., Disp: , Rfl:    lidocaine-prilocaine (EMLA) cream, Apply to affected area once, Disp: 30 g, Rfl: 3   LORazepam (ATIVAN) 0.5 MG tablet, Take 1 tablet (0.5 mg total) by mouth every 6 (six) hours as needed (Nausea or vomiting)., Disp: 30 tablet, Rfl: 0   losartan (COZAAR) 50 MG tablet, Take 50 mg by mouth daily., Disp: , Rfl:    ondansetron (ZOFRAN) 8 MG tablet, Take 1 tablet (8 mg total) by mouth 2 (two) times daily as needed for refractory nausea / vomiting. Start on day 3 after carboplatin chemo., Disp: 30 tablet, Rfl: 1   polyethylene glycol (MIRALAX / GLYCOLAX) 17 g packet, Take 17 g by mouth 2 (two) times daily. (Patient not taking: Reported on 11/02/2019), Disp: 14 each, Rfl: 0   prochlorperazine (COMPAZINE) 10 MG tablet, Take 1 tablet (10 mg total) by mouth every 6 (six) hours as needed (Nausea or vomiting)., Disp: 30 tablet, Rfl: 1  Physical exam:  Vitals:   03/11/21 0854  BP: (!) 88/65  Pulse: 83  Resp: 16  Temp: 99.1 F (37.3 C)  TempSrc: Oral  Weight: 158 lb 11.2 oz (72 kg)   Height: 5' 7"  (1.702 m)   Physical Exam Constitutional:      Comments: He is  sitting in a wheelchair  HENT:     Head: Normocephalic and atraumatic.     Mouth/Throat:     Mouth: Mucous membranes are moist.     Pharynx: Oropharynx is clear.  Eyes:     Pupils: Pupils are equal, round, and reactive to light.  Neck:     Comments: Significant induration from recurrent malignancy Cardiovascular:     Rate and Rhythm: Normal rate and regular rhythm.     Heart sounds: Normal heart sounds.  Pulmonary:     Effort: Pulmonary effort is normal.     Breath sounds: Normal breath sounds.  Abdominal:     General: Bowel sounds are normal.     Palpations: Abdomen is soft.  Skin:    General: Skin is warm and dry.  Neurological:     Mental Status: He is alert and oriented to person, place, and time.     CMP Latest Ref Rng & Units 03/01/2021  Glucose 70 - 99 mg/dL 107(H)  BUN 8 - 23 mg/dL 14  Creatinine 0.61 - 1.24 mg/dL 0.83  Sodium 135 - 145 mmol/L 129(L)  Potassium 3.5 - 5.1 mmol/L 3.2(L)  Chloride 98 - 111 mmol/L 89(L)  CO2 22 - 32 mmol/L 30  Calcium 8.9 - 10.3 mg/dL 8.5(L)  Total Protein 6.5 - 8.1 g/dL 7.0  Total Bilirubin 0.3 - 1.2 mg/dL 1.0  Alkaline Phos 38 - 126 U/L 96  AST 15 - 41 U/L 16  ALT 0 - 44 U/L 15   CBC Latest Ref Rng & Units 03/11/2021  WBC 4.0 - 10.5 K/uL 12.2(H)  Hemoglobin 13.0 - 17.0 g/dL 12.2(L)  Hematocrit 39.0 - 52.0 % 35.8(L)  Platelets 150 - 400 K/uL 300    No images are attached to the encounter.  PERIPHERAL VASCULAR CATHETERIZATION  Result Date: 03/07/2021 See surgical note for result.  NM PET Image Restag (PS) Skull Base To Thigh  Result Date: 02/22/2021 CLINICAL DATA:  Subsequent treatment strategy for carcinoma of the tongue base. EXAM: NUCLEAR MEDICINE PET SKULL BASE TO THIGH TECHNIQUE: 8.9  mCi F-18 FDG was injected intravenously. Full-ring PET imaging was performed from the skull base to thigh after the radiotracer. CT data was obtained and used  for attenuation correction and anatomic localization. Fasting blood glucose: 71 mg/dl COMPARISON:  Multiple exams, including 08/10/2019 FINDINGS: Mediastinal blood pool activity: SUV max 2.3 Liver activity: SUV max NA NECK: Extensive multifocal infiltrative malignancy in the left neck musculature, notably the left scalene musculature, left longissimus cervicis, left levator scapulae, left obliquus capitis, left rectus capitis, the posterior belly of the digastric, and tracking into the longus capitis muscular region to extend across midline in the vicinity of the posterior nasopharynx. This extends to the margin of the left occipital skull although gross penetration into the skull is not well shown. There is tumor extending up into the left semispinalis capitis muscle towards the occiput, and infiltrative tumor involving the left sternocleidomastoid muscle with obliteration of normal fat planes and tissue planes along the internal jugular space. The left internal jugular space infiltrative process has a maximum SUV of 7.4, previously 6.9, and has substantially worsened compared to the prior PET-CT. Along the lower lateral margin of the posterior scalene muscle, a 3.6 by 2.7 cm mass has a maximum SUV of 13.1. The left upper cervical paraspinal tumor has representative SUV of about 10.5, and is shown to extend into the left side of the spinal canal in the epidural space at the C1-2 level causing some rightward deviation of the cord as shown on image 23 series 3. There is also a rounded tumor deposit measuring about 2.2 by 2.4 cm on image 21 of series 3 in the RIGHT (contralateral) upper neck paraspinal musculature in the vicinity of the right rectus capitis posterior muscle, maximum SUV 9.0. An indistinctly marginated left level III lymph node measuring about 2.0 cm in short axis on image 49 series 3 has a maximum SUV of 10.6. A left level IV lymph node is present measuring 1.3 cm in short axis on image sixty of  series 3 with maximum SUV 9.3. There is an immediately adjacent supraclavicular lymph node measuring 1.4 cm in short axis on image 67 of series 3 with maximum SUV 12.2. A right level IIb lymph node measuring 0.7 cm in short axis on image 29 series 3 has a maximum SUV of 3.3. Reduced intracranial metabolic activity in the right temporal lobe with associated adjacent dilation of the temporal horn of the right lateral ventricle. Asymmetric activity of the vocal cords, the right cord having a maximum SUV of 3.5 and the left having a maximum SUV of 2.7, cannot exclude left vocal cord paralysis as a potential cause. There is bony discontinuity in the left transverse process of C1 extending into the transverse foramen on image 25 of series 3 compatible with fracture, with mild sclerosis in the left transverse process. Tumor invasion and/or radiation therapy in the region may be contributory or causative. If there is concern regarding patency of the left vertebral artery, CTA or ultrasound could be utilized. Please note that tumor on the left side is in close proximity to left skull base foramina, and extension of tumor into or through the foramina is difficult to confidently exclude on today's PET-CT. Incidental CT findings: Mild chronic left maxillary sinusitis. CHEST: There is a hypermetabolic left supraclavicular lymph node but this is discussed above in the neck section given its proximity to adjacent level IV adenopathy. A left upper lobe subpleural nodule measuring 9 by 5 mm on image 104 of series 3  has a maximum SUV of 1.5, but is only borderline for sensitive PET-CT assessment. This nodule was not appreciable on 12/23/2016. An 8 by 6 mm lingular nodule on image 119 of series 3 has a maximum SUV of 1.7, and is likewise borderline for sensitive PET-CT size assessment. This nodule was not present on 12/23/2016. A 4 mm left lower lobe nodule shown on image 116 series 3 previously measured 3 mm in diameter on  12/23/2016, and is not hypermetabolic. My goal Incidental CT findings: Coronary, aortic arch, and branch vessel atherosclerotic vascular disease. Airway thickening and airway plugging in both lower lobes with some scattered ground-glass opacities in the lower lobes. A 5 mm right lower lobe pulmonary nodule on image 115 of series 3 is not readily apparent on the 12/23/2016 exam, and not appreciably hypermetabolic. However, some of the ground-glass density in the right lower lobe is somewhat hypermetabolic including a 1 cm confluence of ground-glass density medially in the right lower lobe on image 119 of series 3 with maximum SUV of 2.7. Based on the density of this process I favor it as being inflammatory, but surveillance may be warranted. ABDOMEN/PELVIS: No significant abnormal hypermetabolic activity in this region. Incidental CT findings: Right kidney upper pole photopenic cyst. Atherosclerosis is present, including aortoiliac atherosclerotic disease. Sigmoid colon diverticulosis. Heterotopic ossification in the midline upper abdomen anteriorly measuring 5.7 cm in length. SKELETON: Tumor invasion of the left side of the spinal canal at the C1-2 level and fracture of the left C1 transverse process as noted in the neck section above. No further findings of osseous tumor identified. Incidental CT findings: Degenerative glenohumeral arthropathy on the right. Dextroconvex lumbar scoliosis. IMPRESSION: 1. Extensive progression of infiltrative and nodular tumor involvement of the left neck and to a lesser extent at the right neck as discussed in detail above. This includes internal jugular adenopathy, extensive muscle involvement, invasion of the spinal canal at the C1-2 level, and abutment of the skull base in the vicinity of the skull-base foramina such that foraminal invasion cannot be readily excluded although no overt bony destruction of the skull is currently appreciable. Tumor extends primarily down the left  neck to the level of the thoracic inlet and is associated with lower neck adenopathy and infiltrative tumor in the upper internal jugular space. 2. Fracture left transverse process at C1 extending into the transverse foramen, radiation therapy and/or tumor may be contributory. If there is clinical concern regarding patency of the left vertebral artery, CT angiography of the neck could be employed. 3. Two nodules in the left lung average about 7 mm in diameter and demonstrate only low-grade activity, but these are at the borderline of accurate characterization by PET-CT and surveillance is recommended to exclude metastatic disease. 4. Bilateral lower lobe airway thickening with some plugging and ground-glass opacities. In the right lower lobe some of the ground-glass opacity has accentuated metabolic activity but I still favor this as being inflammatory, surveillance suggested. 5. Other imaging findings of potential clinical significance: Mild chronic left maxillary sinusitis. Aortic Atherosclerosis (ICD10-I70.0). Coronary atherosclerosis. Sigmoid colon diverticulosis. Dextroconvex lumbar scoliosis. Electronically Signed   By: Van Clines M.D.   On: 02/22/2021 14:00     Assessment and plan- Patient is a 66 y.o. 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  He is here for on treatment assessment prior to cycle 1 of CarboTaxol Keytruda chemotherapy  Hypotension/hypokalemia: He will hold his hydrochlorothiazide 25  mg and we will give him 1 L of IV fluids today along with 20 mEq of IV potassium.  He will be coming on day 3 for Udenyca and will receive 1 L of IV fluids along with more potassium as well.  If his blood pressure improves after IV fluids he will proceed with cycle 1 of CarboTaxol Keytruda chemotherapy today.  Carboplatin is being given at AUC 5 along with Taxol at 175 mg per metered square.  Udenyca on day 3.  Discussed risks and  benefits of chemotherapy including all but not limited to nausea, vomiting, low blood counts, risk of infections and hospitalizations.  Treatment will be given with a palliative intent..  Patient understands and agrees to proceed as planned.  He is opted not to proceed with radiation treatment at this time.  Neoplasm related pain: Continue gabapentin.  We will add oxycodone 5 mg every 6 hours as needed.  Stop hydrocodone.  He will be reassessed by NP Altha Harm next week when he comes for more IV fluids as well.  Abnormal thyroid function tests: TSH is elevated.  Will await free T4 before deciding if he needs to be started on levothyroxine.  I will see him back in 3 weeks for cycle 2 of CarboTaxol Keytruda chemotherapy   Visit Diagnosis 1. Carcinoma of base of tongue ([place])   2. Encounter for antineoplastic chemotherapy   3. Encounter for antineoplastic immunotherapy   4. Neoplasm related pain   5. Thyroid function study abnormality   6. Hypokalemia   7. Hypotension due to hypovolemia      Dr. Randa Evens, MD, MPH [facility] [phone] 03/11/2021 8:30 AM

## 2021-03-11 NOTE — Patient Instructions (Signed)
[facility] [place]   Discharge Instructions: Thank you for choosing [facility] to provide your oncology and hematology care.  If you have a lab appointment with the [facility], please go directly to the [facility] and check in at the registration area.   Wear comfortable clothing and clothing appropriate for easy access to any Portacath or PICC line.   We strive to give you quality time with your provider. You may need to reschedule your appointment if you arrive late (15 or more minutes).  Arriving late affects you and other patients whose appointments are after yours.  Also, if you miss three or more appointments without notifying the office, you may be dismissed from the clinic at the providers discretion.      For prescription refill requests, have your pharmacy contact our office and allow 72 hours for refills to be completed.    Today you received the following chemotherapy and/or immunotherapy agents: Keytruda, Taxol, and Carboplatin.      To help prevent nausea and vomiting after your treatment, we encourage you to take your nausea medication as directed.  BELOW ARE SYMPTOMS THAT SHOULD BE REPORTED IMMEDIATELY: *FEVER GREATER THAN 100.4 F (38 C) OR HIGHER *CHILLS OR SWEATING *NAUSEA AND VOMITING THAT IS NOT CONTROLLED WITH YOUR NAUSEA MEDICATION *UNUSUAL SHORTNESS OF BREATH *UNUSUAL BRUISING OR BLEEDING *URINARY PROBLEMS (pain or burning when urinating, or frequent urination) *BOWEL PROBLEMS (unusual diarrhea, constipation, pain near the anus) TENDERNESS IN MOUTH AND THROAT WITH OR WITHOUT PRESENCE OF ULCERS (sore throat, sores in mouth, or a toothache) UNUSUAL RASH, SWELLING OR PAIN  UNUSUAL VAGINAL DISCHARGE OR ITCHING   Items with * indicate a potential emergency and should be followed up as soon as possible or go to the Emergency Department if any problems should occur.  Please show the CHEMOTHERAPY ALERT CARD or IMMUNOTHERAPY  ALERT CARD at check-in to the Emergency Department and triage nurse.  Should you have questions after your visit or need to cancel or reschedule your appointment, please contact [facility]  Dept: [phone]  and follow the prompts.  Office hours are 8:00 a.m. to 4:30 p.m. Monday - Friday. Please note that voicemails left after 4:00 p.m. may not be returned until the following business day.  We are closed weekends and major holidays. You have access to a nurse at all times for urgent questions. Please call the main number to the clinic Dept: [phone] and follow the prompts.  For any non-urgent questions, you may also contact your provider using MyChart. We now offer e-Visits for anyone 55 and older to request care online for non-urgent symptoms. For details visit [email or web address].   Also download the MyChart app! Go to the app store, search "MyChart", open the app, select [facility], and log in with your MyChart username and password.  Due to Covid, a mask is required upon entering the hospital/clinic. If you do not have a mask, one will be given to you upon arrival. For doctor visits, patients may have 1 support person aged 104 or older with them. For treatment visits, patients cannot have anyone with them due to current Covid guidelines and our immunocompromised population.

## 2021-03-12 ENCOUNTER — Ambulatory Visit: Payer: Medicare Other

## 2021-03-12 LAB — T4: T4, Total: 4.6 ug/dL (ref 4.5–12.0)

## 2021-03-13 ENCOUNTER — Inpatient Hospital Stay (HOSPITAL_BASED_OUTPATIENT_CLINIC_OR_DEPARTMENT_OTHER): Payer: Medicare Other | Admitting: Hospice and Palliative Medicine

## 2021-03-13 ENCOUNTER — Ambulatory Visit: Payer: Medicare Other

## 2021-03-13 ENCOUNTER — Other Ambulatory Visit: Payer: Self-pay

## 2021-03-13 ENCOUNTER — Inpatient Hospital Stay: Payer: Medicare Other

## 2021-03-13 VITALS — BP 115/72 | HR 68 | Temp 98.4°F | Resp 18

## 2021-03-13 DIAGNOSIS — Z515 Encounter for palliative care: Secondary | ICD-10-CM

## 2021-03-13 DIAGNOSIS — C01 Malignant neoplasm of base of tongue: Secondary | ICD-10-CM | POA: Diagnosis not present

## 2021-03-13 DIAGNOSIS — E876 Hypokalemia: Secondary | ICD-10-CM

## 2021-03-13 DIAGNOSIS — Z5112 Encounter for antineoplastic immunotherapy: Secondary | ICD-10-CM | POA: Diagnosis not present

## 2021-03-13 DIAGNOSIS — Z7189 Other specified counseling: Secondary | ICD-10-CM

## 2021-03-13 DIAGNOSIS — E860 Dehydration: Secondary | ICD-10-CM

## 2021-03-13 DIAGNOSIS — G893 Neoplasm related pain (acute) (chronic): Secondary | ICD-10-CM | POA: Diagnosis not present

## 2021-03-13 MED ORDER — SODIUM CHLORIDE 0.9% FLUSH
10.0000 mL | Freq: Once | INTRAVENOUS | Status: AC
  Administered 2021-03-13: 10 mL via INTRAVENOUS
  Filled 2021-03-13: qty 10

## 2021-03-13 MED ORDER — HEPARIN SOD (PORK) LOCK FLUSH 100 UNIT/ML IV SOLN
500.0000 [IU] | Freq: Once | INTRAVENOUS | Status: AC
  Administered 2021-03-13: 500 [IU] via INTRAVENOUS
  Filled 2021-03-13: qty 5

## 2021-03-13 MED ORDER — PEGFILGRASTIM-CBQV 6 MG/0.6ML ~~LOC~~ SOSY
6.0000 mg | PREFILLED_SYRINGE | Freq: Once | SUBCUTANEOUS | Status: AC
  Administered 2021-03-13: 6 mg via SUBCUTANEOUS
  Filled 2021-03-13: qty 0.6

## 2021-03-13 MED ORDER — SODIUM CHLORIDE 0.9 % IV SOLN
Freq: Once | INTRAVENOUS | Status: AC
  Filled 2021-03-13: qty 250

## 2021-03-13 MED ORDER — POTASSIUM CHLORIDE 20 MEQ/100ML IV SOLN
20.0000 meq | Freq: Once | INTRAVENOUS | Status: AC
  Administered 2021-03-13: 20 meq via INTRAVENOUS

## 2021-03-13 NOTE — Progress Notes (Signed)
[place] at [facility] [phone] [phone]) [phone]   Name: Kent [name] Date: 03/13/2021 MRN: [id number]  DOB: 10/14/1955  Patient Care Team: Earlie Server, MD as PCP - General (Oncology) Margaretha Sheffield, MD (Otolaryngology)    REASON FOR CONSULTATION: Kent [name] is a 66 y.o. 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  Patient met with Dr. Mike Gip following lymph node biopsy in September 2021 to discuss potential treatment options.  He was felt not to be a good surgical candidate.  Unfortunately, patient was ultimately lost to follow-up and then reestablished cancer care in December 2022.  Patient had an MRI of the brain at [facility] on 12/12/2020 revealing extensive metastatic disease with C1 and skull base invasion and left epidural canal infiltration without cord compression.  Patient has had severe neck and head pain.  Patient has been referred to radiation oncology.  Patient was referred to palliative care to help address goals.  SOCIAL HISTORY:     reports that he has never smoked. He has never used smokeless tobacco. He reports that he does not drink alcohol and does not use drugs.  Patient is divorced.  He lives at home with his sister.  Patient has a son and daughter.  Patient retired from [employer] where he worked in the [employer] facility.  ADVANCE DIRECTIVES:  Does not have  CODE STATUS:   PAST MEDICAL HISTORY: Past Medical History:  Diagnosis Date   Arthritis    Diabetes mellitus without complication ([place])    Hyperlipidemia    Hypertension    Obesity    Osteomyelitis ([place])    From pt chart   Renal insufficiency    CKD noted on chart   Staph infection 05/29/2016   Nov 2017. Staph infection in LT shoulder requiring debridement and grafts   Status post debridement     Stomach cancer ([place])    had tumor removed- [facility]   Tongue cancer ([place])    Stage IV squamous cell carcinoma of the base of the tongue/left tonsil    PAST SURGICAL HISTORY:  Past Surgical History:  Procedure Laterality Date   EXCISION OF TONGUE LESION Left 06/19/2016   Procedure: EXCISION OF TONGUE LESION;  Surgeon: Margaretha Sheffield, MD;  Location: [place];  Service: ENT;  Laterality: Left;  diabetic - insulin and oral meds - not using either currently   gastric tumor removed  2010   I & D EXTREMITY Left 01/09/2016   Procedure: IRRIGATION AND DEBRIDEMENT EXTREMITY;  Surgeon: Robert Bellow, MD;  Location: [facility];  Service: General;  Laterality: Left;   LARYNGOSCOPY N/A 06/19/2016   Procedure: LARYNGOSCOPY;  Surgeon: Margaretha Sheffield, MD;  Location: [place];  Service: ENT;  Laterality: N/A;   PERIPHERAL VASCULAR CATHETERIZATION Right 12/31/2015   Procedure: Lower Extremity Angiography;  Surgeon: Algernon Huxley, MD;  Location: [place] CV [facility];  Service: Cardiovascular;  Laterality: Right;   PORTA CATH INSERTION N/A 03/07/2021   Procedure: PORTA CATH INSERTION;  Surgeon: Algernon Huxley, MD;  Location: [place] [facility];  Service: Cardiovascular;  Laterality: N/A;   tongue mass biopsy      HEMATOLOGY/ONCOLOGY HISTORY:  Oncology History  Carcinoma of base of tongue ([place])  09/10/2015 Initial Diagnosis   Carcinoma of base of tongue ([place])   03/11/2021 -  Chemotherapy  Patient is on Treatment Plan :  Carboplatin (5) + Paclitaxel (175) + Pembrolizumab (200) D1 q21d x 4 cycles / Pembrolizumab (200) Maintenance D1 q21d       ALLERGIES:  has No Known Allergies.  MEDICATIONS:  Current Outpatient Medications  Medication Sig Dispense Refill   amLODipine (NORVASC) 10 MG tablet Take 10 mg by mouth daily.     aspirin EC 81 MG EC tablet Take 1 tablet (81 mg total) by mouth daily.     atorvastatin (LIPITOR) 40 MG tablet Take 1 tablet (40 mg total) by mouth daily.      dexamethasone (DECADRON) 4 MG tablet Take 2 tablets (8 mg total) by mouth daily. Start the day after carboplatin chemotherapy for 3 days. 30 tablet 1   LANTUS SOLOSTAR 100 UNIT/ML Solostar Pen Inject 18 Units into the skin at bedtime.     levothyroxine (SYNTHROID) 25 MCG tablet Take 25 mcg by mouth daily.     lidocaine-prilocaine (EMLA) cream Apply to affected area once (Patient not taking: Reported on 03/11/2021) 30 g 3   LORazepam (ATIVAN) 0.5 MG tablet Take 1 tablet (0.5 mg total) by mouth every 6 (six) hours as needed (Nausea or vomiting). (Patient not taking: Reported on 03/11/2021) 30 tablet 0   losartan (COZAAR) 50 MG tablet Take 50 mg by mouth daily.     ondansetron (ZOFRAN) 8 MG tablet Take 1 tablet (8 mg total) by mouth 2 (two) times daily as needed for refractory nausea / vomiting. Start on day 3 after carboplatin chemo. 30 tablet 1   oxyCODONE (OXY IR/ROXICODONE) 5 MG immediate release tablet Take 1 tablet (5 mg total) by mouth every 6 (six) hours as needed for severe pain. 60 tablet 0   polyethylene glycol (MIRALAX / GLYCOLAX) 17 g packet Take 17 g by mouth 2 (two) times daily. (Patient taking differently: Take 17 g by mouth daily as needed.) 14 each 0   prochlorperazine (COMPAZINE) 10 MG tablet Take 1 tablet (10 mg total) by mouth every 6 (six) hours as needed (Nausea or vomiting). (Patient not taking: Reported on 03/11/2021) 30 tablet 1   No current facility-administered medications for this visit.   Facility-Administered Medications Ordered in Other Visits  Medication Dose Route Frequency Provider Last Rate Last Admin   0.9 %  sodium chloride infusion   Intravenous Once Sindy Guadeloupe, MD 999 mL/hr at 03/13/21 1026 New Bag at 03/13/21 1026   heparin lock flush 100 unit/mL  500 Units Intravenous Once Sindy Guadeloupe, MD       [id number] [facility]) injection 6 mg  6 mg Subcutaneous Once Sindy Guadeloupe, MD       potassium chloride 20 mEq in 100 mL IVPB  20 mEq Intravenous Once Sindy Guadeloupe, MD 100 mL/hr at 03/13/21 1043 20 mEq at 03/13/21 1043    VITAL SIGNS: There were no vitals taken for this visit. There were no vitals filed for this visit.  Estimated body mass index is 24.86 kg/m as calculated from the following:   Height as of 03/11/21: 5' 7"  (1.702 m).   Weight as of 03/11/21: 158 lb 11.2 oz (72 kg).  LABS: CBC:    Component Value Date/Time   WBC 12.2 (H) 03/11/2021 0814   HGB 12.2 (L) 03/11/2021 0814   HGB 15.2 02/14/2013 1204   HCT 35.8 (L) 03/11/2021 0814   HCT 43.6 02/14/2013 1204   PLT 300 03/11/2021 0814   PLT 188 02/14/2013 1204   MCV 84.0 03/11/2021  0814   MCV 85 02/14/2013 1204   NEUTROABS 10.3 (H) 03/11/2021 0814   LYMPHSABS 1.0 03/11/2021 0814   MONOABS 0.9 03/11/2021 0814   EOSABS 0.0 03/11/2021 0814   BASOSABS 0.0 03/11/2021 0814   Comprehensive Metabolic Panel:    Component Value Date/Time   NA 127 (L) 03/11/2021 0814   NA 125 (L) 01/07/2016 1322   NA 127 (L) 02/14/2013 1204   K 2.9 (L) 03/11/2021 0814   K 3.8 02/14/2013 1204   CL 90 (L) 03/11/2021 0814   CL 93 (L) 02/14/2013 1204   CO2 29 03/11/2021 0814   CO2 28 02/14/2013 1204   BUN 12 03/11/2021 0814   BUN 12 01/07/2016 1322   BUN 21 (H) 02/14/2013 1204   CREATININE 0.70 03/11/2021 0814   CREATININE 1.63 (H) 02/14/2013 1204   GLUCOSE 149 (H) 03/11/2021 0814   GLUCOSE 420 (H) 02/14/2013 1204   CALCIUM 8.2 (L) 03/11/2021 0814   CALCIUM 8.8 02/14/2013 1204   AST 18 03/11/2021 0814   AST 31 02/14/2013 1204   ALT 15 03/11/2021 0814   ALT 32 02/14/2013 1204   ALKPHOS 102 03/11/2021 0814   ALKPHOS 121 (H) 02/14/2013 1204   BILITOT 0.7 03/11/2021 0814   BILITOT 1.0 01/07/2016 1322   BILITOT 1.0 02/14/2013 1204   PROT 6.4 (L) 03/11/2021 0814   PROT 5.2 (L) 01/07/2016 1322   PROT 7.1 02/14/2013 1204   ALBUMIN 3.2 (L) 03/11/2021 0814   ALBUMIN 2.8 (L) 01/07/2016 1322   ALBUMIN 3.5 02/14/2013 1204    RADIOGRAPHIC STUDIES: PERIPHERAL VASCULAR  CATHETERIZATION  Result Date: 03/07/2021 See surgical note for result.  NM PET Image Restag (PS) Skull Base To Thigh  Result Date: 02/22/2021 CLINICAL DATA:  Subsequent treatment strategy for carcinoma of the tongue base. EXAM: NUCLEAR MEDICINE PET SKULL BASE TO THIGH TECHNIQUE: 8.9 mCi F-18 FDG was injected intravenously. Full-ring PET imaging was performed from the skull base to thigh after the radiotracer. CT data was obtained and used for attenuation correction and anatomic localization. Fasting blood glucose: 71 mg/dl COMPARISON:  Multiple exams, including 08/10/2019 FINDINGS: Mediastinal blood pool activity: SUV max 2.3 Liver activity: SUV max NA NECK: Extensive multifocal infiltrative malignancy in the left neck musculature, notably the left scalene musculature, left longissimus cervicis, left levator scapulae, left obliquus capitis, left rectus capitis, the posterior belly of the digastric, and tracking into the longus capitis muscular region to extend across midline in the vicinity of the posterior nasopharynx. This extends to the margin of the left occipital skull although gross penetration into the skull is not well shown. There is tumor extending up into the left semispinalis capitis muscle towards the occiput, and infiltrative tumor involving the left sternocleidomastoid muscle with obliteration of normal fat planes and tissue planes along the internal jugular space. The left internal jugular space infiltrative process has a maximum SUV of 7.4, previously 6.9, and has substantially worsened compared to the prior PET-CT. Along the lower lateral margin of the posterior scalene muscle, a 3.6 by 2.7 cm mass has a maximum SUV of 13.1. The left upper cervical paraspinal tumor has representative SUV of about 10.5, and is shown to extend into the left side of the spinal canal in the epidural space at the C1-2 level causing some rightward deviation of the cord as shown on image 23 series 3. There is also a  rounded tumor deposit measuring about 2.2 by 2.4 cm on image 21 of series 3 in the RIGHT (contralateral) upper neck  paraspinal musculature in the vicinity of the right rectus capitis posterior muscle, maximum SUV 9.0. An indistinctly marginated left level III lymph node measuring about 2.0 cm in short axis on image 49 series 3 has a maximum SUV of 10.6. A left level IV lymph node is present measuring 1.3 cm in short axis on image sixty of series 3 with maximum SUV 9.3. There is an immediately adjacent supraclavicular lymph node measuring 1.4 cm in short axis on image 67 of series 3 with maximum SUV 12.2. A right level IIb lymph node measuring 0.7 cm in short axis on image 29 series 3 has a maximum SUV of 3.3. Reduced intracranial metabolic activity in the right temporal lobe with associated adjacent dilation of the temporal horn of the right lateral ventricle. Asymmetric activity of the vocal cords, the right cord having a maximum SUV of 3.5 and the left having a maximum SUV of 2.7, cannot exclude left vocal cord paralysis as a potential cause. There is bony discontinuity in the left transverse process of C1 extending into the transverse foramen on image 25 of series 3 compatible with fracture, with mild sclerosis in the left transverse process. Tumor invasion and/or radiation therapy in the region may be contributory or causative. If there is concern regarding patency of the left vertebral artery, CTA or ultrasound could be utilized. Please note that tumor on the left side is in close proximity to left skull base foramina, and extension of tumor into or through the foramina is difficult to confidently exclude on today's PET-CT. Incidental CT findings: Mild chronic left maxillary sinusitis. CHEST: There is a hypermetabolic left supraclavicular lymph node but this is discussed above in the neck section given its proximity to adjacent level IV adenopathy. A left upper lobe subpleural nodule measuring 9 by 5 mm on  image 104 of series 3 has a maximum SUV of 1.5, but is only borderline for sensitive PET-CT assessment. This nodule was not appreciable on 12/23/2016. An 8 by 6 mm lingular nodule on image 119 of series 3 has a maximum SUV of 1.7, and is likewise borderline for sensitive PET-CT size assessment. This nodule was not present on 12/23/2016. A 4 mm left lower lobe nodule shown on image 116 series 3 previously measured 3 mm in diameter on 12/23/2016, and is not hypermetabolic. My goal Incidental CT findings: Coronary, aortic arch, and branch vessel atherosclerotic vascular disease. Airway thickening and airway plugging in both lower lobes with some scattered ground-glass opacities in the lower lobes. A 5 mm right lower lobe pulmonary nodule on image 115 of series 3 is not readily apparent on the 12/23/2016 exam, and not appreciably hypermetabolic. However, some of the ground-glass density in the right lower lobe is somewhat hypermetabolic including a 1 cm confluence of ground-glass density medially in the right lower lobe on image 119 of series 3 with maximum SUV of 2.7. Based on the density of this process I favor it as being inflammatory, but surveillance may be warranted. ABDOMEN/PELVIS: No significant abnormal hypermetabolic activity in this region. Incidental CT findings: Right kidney upper pole photopenic cyst. Atherosclerosis is present, including aortoiliac atherosclerotic disease. Sigmoid colon diverticulosis. Heterotopic ossification in the midline upper abdomen anteriorly measuring 5.7 cm in length. SKELETON: Tumor invasion of the left side of the spinal canal at the C1-2 level and fracture of the left C1 transverse process as noted in the neck section above. No further findings of osseous tumor identified. Incidental CT findings: Degenerative glenohumeral arthropathy on the right.  Dextroconvex lumbar scoliosis. IMPRESSION: 1. Extensive progression of infiltrative and nodular tumor involvement of the left neck  and to a lesser extent at the right neck as discussed in detail above. This includes internal jugular adenopathy, extensive muscle involvement, invasion of the spinal canal at the C1-2 level, and abutment of the skull base in the vicinity of the skull-base foramina such that foraminal invasion cannot be readily excluded although no overt bony destruction of the skull is currently appreciable. Tumor extends primarily down the left neck to the level of the thoracic inlet and is associated with lower neck adenopathy and infiltrative tumor in the upper internal jugular space. 2. Fracture left transverse process at C1 extending into the transverse foramen, radiation therapy and/or tumor may be contributory. If there is clinical concern regarding patency of the left vertebral artery, CT angiography of the neck could be employed. 3. Two nodules in the left lung average about 7 mm in diameter and demonstrate only low-grade activity, but these are at the borderline of accurate characterization by PET-CT and surveillance is recommended to exclude metastatic disease. 4. Bilateral lower lobe airway thickening with some plugging and ground-glass opacities. In the right lower lobe some of the ground-glass opacity has accentuated metabolic activity but I still favor this as being inflammatory, surveillance suggested. 5. Other imaging findings of potential clinical significance: Mild chronic left maxillary sinusitis. Aortic Atherosclerosis (ICD10-I70.0). Coronary atherosclerosis. Sigmoid colon diverticulosis. Dextroconvex lumbar scoliosis. Electronically Signed   By: Van Clines M.D.   On: 02/22/2021 14:00    PERFORMANCE STATUS (ECOG) : 2 - Symptomatic, <50% confined to bed  Review of Systems Unless otherwise noted, a complete review of systems is negative.  Physical Exam General: NAD Pulmonary: Unlabored Extremities: no edema, no joint deformities Skin: no rashes Neurological: Weakness but otherwise  nonfocal  IMPRESSION: Patient seen while he was receiving IV fluids.  Patient recently saw Dr. Janese Banks and was rotated from [facility] to oxycodone given persistent facial pain.  Patient reports that the oxycodone is significantly more effective and has been controlling the pain well.  He says he is primarily taking it just at night to help him sleep.  Patient is unclear if he is still taking the gabapentin.  Patient does report some phlegm and mucus production with cough.  Recommended that he trial Mucinex.  He denies issues with swallowing but could consider referral to ST if needed.  He is being followed by dietitian.  He denies other symptomatic complaints and feels like he is doing reasonably well.  PLAN: -Continue current scope of treatment -ACP/MOST form previously reviewed -Continue oxycodone as needed for pain -Daily bowel regimen as needed -Consider referral to establish with SLP -Follow-up telephone visit 1 month   Patient expressed understanding and was in agreement with this plan. He also understands that He can call the clinic at any time with any questions, concerns, or complaints.     Time Total: 15 minutes  Visit consisted of counseling and education dealing with the complex and emotionally intense issues of symptom management and palliative care in the setting of serious and potentially life-threatening illness.Greater than 50%  of this time was spent counseling and coordinating care related to the above assessment and plan.  Signed by: Altha Harm, PhD, NP-C

## 2021-03-13 NOTE — Patient Instructions (Signed)

## 2021-03-13 NOTE — Progress Notes (Signed)
Pt getting 1 L NS over 1 hour and 20 meq KCL over 1 hour. Pt eating graham crackers with peanut butter and drinking lemon lime soda. Denies any pain. Spits up phlegm, white in color. Palliative visit with Josh done in exam room today. Discharged to home. Gave his cousin his AVS.

## 2021-03-14 ENCOUNTER — Ambulatory Visit: Payer: Medicare Other

## 2021-03-15 ENCOUNTER — Ambulatory Visit: Payer: Medicare Other

## 2021-03-18 ENCOUNTER — Inpatient Hospital Stay (HOSPITAL_BASED_OUTPATIENT_CLINIC_OR_DEPARTMENT_OTHER): Payer: Medicare Other | Admitting: Hospice and Palliative Medicine

## 2021-03-18 ENCOUNTER — Inpatient Hospital Stay: Payer: Medicare Other

## 2021-03-18 ENCOUNTER — Other Ambulatory Visit: Payer: Self-pay | Admitting: *Deleted

## 2021-03-18 ENCOUNTER — Other Ambulatory Visit: Payer: Self-pay

## 2021-03-18 ENCOUNTER — Encounter: Payer: Self-pay | Admitting: Oncology

## 2021-03-18 ENCOUNTER — Ambulatory Visit: Payer: Medicare Other

## 2021-03-18 VITALS — BP 106/74 | HR 100 | Temp 98.4°F | Resp 16

## 2021-03-18 DIAGNOSIS — C01 Malignant neoplasm of base of tongue: Secondary | ICD-10-CM

## 2021-03-18 DIAGNOSIS — Z515 Encounter for palliative care: Secondary | ICD-10-CM

## 2021-03-18 DIAGNOSIS — Z5112 Encounter for antineoplastic immunotherapy: Secondary | ICD-10-CM | POA: Diagnosis not present

## 2021-03-18 DIAGNOSIS — E876 Hypokalemia: Secondary | ICD-10-CM

## 2021-03-18 DIAGNOSIS — Z95828 Presence of other vascular implants and grafts: Secondary | ICD-10-CM

## 2021-03-18 DIAGNOSIS — Z7189 Other specified counseling: Secondary | ICD-10-CM

## 2021-03-18 LAB — CBC WITH DIFFERENTIAL/PLATELET
Abs Immature Granulocytes: 0.15 10*3/uL — ABNORMAL HIGH (ref 0.00–0.07)
Basophils Absolute: 0 10*3/uL (ref 0.0–0.1)
Basophils Relative: 1 %
Eosinophils Absolute: 0.1 10*3/uL (ref 0.0–0.5)
Eosinophils Relative: 1 %
HCT: 33.1 % — ABNORMAL LOW (ref 39.0–52.0)
Hemoglobin: 11.3 g/dL — ABNORMAL LOW (ref 13.0–17.0)
Immature Granulocytes: 2 %
Lymphocytes Relative: 7 %
Lymphs Abs: 0.5 10*3/uL — ABNORMAL LOW (ref 0.7–4.0)
MCH: 28.8 pg (ref 26.0–34.0)
MCHC: 34.1 g/dL (ref 30.0–36.0)
MCV: 84.2 fL (ref 80.0–100.0)
Monocytes Absolute: 0.9 10*3/uL (ref 0.1–1.0)
Monocytes Relative: 11 %
Neutro Abs: 6.2 10*3/uL (ref 1.7–7.7)
Neutrophils Relative %: 78 %
Platelets: 194 10*3/uL (ref 150–400)
RBC: 3.93 MIL/uL — ABNORMAL LOW (ref 4.22–5.81)
RDW: 13.6 % (ref 11.5–15.5)
Smear Review: NORMAL
WBC: 7.9 10*3/uL (ref 4.0–10.5)
nRBC: 0 % (ref 0.0–0.2)

## 2021-03-18 LAB — BASIC METABOLIC PANEL
Anion gap: 7 (ref 5–15)
BUN: 6 mg/dL — ABNORMAL LOW (ref 8–23)
CO2: 30 mmol/L (ref 22–32)
Calcium: 7.8 mg/dL — ABNORMAL LOW (ref 8.9–10.3)
Chloride: 91 mmol/L — ABNORMAL LOW (ref 98–111)
Creatinine, Ser: 0.59 mg/dL — ABNORMAL LOW (ref 0.61–1.24)
GFR, Estimated: >60 mL/min (ref >60)
Glucose, Bld: 157 mg/dL — ABNORMAL HIGH (ref 70–99)
Potassium: 2.8 mmol/L — ABNORMAL LOW (ref 3.5–5.1)
Sodium: 128 mmol/L — ABNORMAL LOW (ref 135–145)

## 2021-03-18 LAB — MAGNESIUM: Magnesium: 1.6 mg/dL — ABNORMAL LOW (ref 1.7–2.4)

## 2021-03-18 MED ORDER — HEPARIN SOD (PORK) LOCK FLUSH 100 UNIT/ML IV SOLN
500.0000 [IU] | Freq: Once | INTRAVENOUS | Status: AC
  Administered 2021-03-18: 500 [IU] via INTRAVENOUS
  Filled 2021-03-18: qty 5

## 2021-03-18 MED ORDER — OYSTER SHELL CALCIUM/D3 500-5 MG-MCG PO TABS: 1.0000 | ORAL_TABLET | Freq: Every day | ORAL | 0 refills | Status: DC

## 2021-03-18 MED ORDER — POTASSIUM CHLORIDE CRYS ER 10 MEQ PO TBCR: 20.0000 meq | EXTENDED_RELEASE_TABLET | Freq: Every day | ORAL | 0 refills | Status: DC

## 2021-03-18 MED ORDER — POTASSIUM CHLORIDE 20 MEQ/100ML IV SOLN
20.0000 meq | Freq: Once | INTRAVENOUS | Status: AC
  Administered 2021-03-18: 20 meq via INTRAVENOUS

## 2021-03-18 MED ORDER — SODIUM CHLORIDE 0.9% FLUSH
10.0000 mL | Freq: Once | INTRAVENOUS | Status: DC
  Filled 2021-03-18: qty 10

## 2021-03-18 MED ORDER — MAGNESIUM SULFATE 2 GM/50ML IV SOLN
2.0000 g | Freq: Once | INTRAVENOUS | Status: AC
  Administered 2021-03-18: 2 g via INTRAVENOUS
  Filled 2021-03-18: qty 50

## 2021-03-18 MED ORDER — SODIUM CHLORIDE 0.9 % IV SOLN
INTRAVENOUS | Status: AC
  Filled 2021-03-18: qty 250

## 2021-03-18 MED ORDER — POTASSIUM CHLORIDE 20 MEQ/15ML (10%) PO SOLN: 20.0000 meq | Freq: Every day | ORAL | 0 refills | Status: DC

## 2021-03-18 NOTE — Progress Notes (Signed)
Pt in clinic for IV fluids and palliative care today. He reports weakness/fatigue, dizziness, and sore mouth. He associates the dizziness to a prior stoke he had 3 years ago. Denies nausea, vomiting, or diarrhea. Pt provided with ONS literature "What can I do about Oral Mucositis?" All questions answered.

## 2021-03-18 NOTE — Progress Notes (Signed)
[place] at [facility] [phone] [phone]   Name: Kent [name] Date: 03/18/2021 MRN: [id number]  DOB: 1955/11/02  Patient Care Team: Earlie Server, MD as PCP - General (Oncology) Margaretha Sheffield, MD (Otolaryngology)    REASON FOR CONSULTATION: Kent [name] is a 66 y.o. 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  Patient met with Dr. Mike Gip following lymph node biopsy in September 2021 to discuss potential treatment options.  He was felt not to be a good surgical candidate.  Unfortunately, patient was ultimately lost to follow-up and then reestablished cancer care in December 2022.  Patient had an MRI of the brain at [facility] on 12/12/2020 revealing extensive metastatic disease with C1 and skull base invasion and left epidural canal infiltration without cord compression.  Patient has had severe neck and head pain.  Patient has been referred to radiation oncology.  Patient was referred to palliative care to help address goals.  SOCIAL HISTORY:     reports that he has never smoked. He has never used smokeless tobacco. He reports that he does not drink alcohol and does not use drugs.  Patient is divorced.  He lives at home with his sister.  Patient has a son and daughter.  Patient retired from [employer] where he worked in the [employer] facility.  ADVANCE DIRECTIVES:  On file - sister is HCPOA  CODE STATUS:   PAST MEDICAL HISTORY: Past Medical History:  Diagnosis Date   Arthritis    Diabetes mellitus without complication ([place])    Hyperlipidemia    Hypertension    Obesity    Osteomyelitis ([place])    From pt chart   Renal insufficiency    CKD noted on chart   Staph infection 05/29/2016   Nov 2017. Staph infection in LT shoulder requiring debridement and grafts   Status post  debridement    Stomach cancer ([place])    had tumor removed- [facility]   Tongue cancer ([place])    Stage IV squamous cell carcinoma of the base of the tongue/left tonsil    PAST SURGICAL HISTORY:  Past Surgical History:  Procedure Laterality Date   EXCISION OF TONGUE LESION Left 06/19/2016   Procedure: EXCISION OF TONGUE LESION;  Surgeon: Margaretha Sheffield, MD;  Location: [place];  Service: ENT;  Laterality: Left;  diabetic - insulin and oral meds - not using either currently   gastric tumor removed  2010   I & D EXTREMITY Left 01/09/2016   Procedure: IRRIGATION AND DEBRIDEMENT EXTREMITY;  Surgeon: Robert Bellow, MD;  Location: [facility];  Service: General;  Laterality: Left;   LARYNGOSCOPY N/A 06/19/2016   Procedure: LARYNGOSCOPY;  Surgeon: Margaretha Sheffield, MD;  Location: [place];  Service: ENT;  Laterality: N/A;   PERIPHERAL VASCULAR CATHETERIZATION Right 12/31/2015   Procedure: Lower Extremity Angiography;  Surgeon: Algernon Huxley, MD;  Location: [place] [facility];  Service: Cardiovascular;  Laterality: Right;   PORTA CATH INSERTION N/A 03/07/2021   Procedure: PORTA CATH INSERTION;  Surgeon: Algernon Huxley, MD;  Location: [place] [facility];  Service: Cardiovascular;  Laterality: N/A;   tongue mass biopsy      HEMATOLOGY/ONCOLOGY HISTORY:  Oncology History  Carcinoma of base of tongue ([place])  09/10/2015 Initial Diagnosis   Carcinoma of base of tongue ([place])   03/11/2021 -  Chemotherapy   Patient is on Treatment Plan :  Carboplatin (5) + Paclitaxel (175) + Pembrolizumab (200) D1 q21d x 4 cycles / Pembrolizumab (200) Maintenance D1 q21d       ALLERGIES:  has No Known Allergies.  MEDICATIONS:  Current Outpatient Medications  Medication Sig Dispense Refill   amLODipine (NORVASC) 10 MG tablet Take 10 mg by mouth daily.     aspirin EC 81 MG EC tablet Take 1 tablet (81 mg total) by mouth daily.     atorvastatin (LIPITOR) 40 MG tablet Take 1 tablet (40 mg total) by mouth daily.      dexamethasone (DECADRON) 4 MG tablet Take 2 tablets (8 mg total) by mouth daily. Start the day after carboplatin chemotherapy for 3 days. 30 tablet 1   LANTUS SOLOSTAR 100 UNIT/ML Solostar Pen Inject 18 Units into the skin at bedtime.     levothyroxine (SYNTHROID) 25 MCG tablet Take 25 mcg by mouth daily.     lidocaine-prilocaine (EMLA) cream Apply to affected area once (Patient not taking: Reported on 03/11/2021) 30 g 3   LORazepam (ATIVAN) 0.5 MG tablet Take 1 tablet (0.5 mg total) by mouth every 6 (six) hours as needed (Nausea or vomiting). (Patient not taking: Reported on 03/11/2021) 30 tablet 0   losartan (COZAAR) 50 MG tablet Take 50 mg by mouth daily.     ondansetron (ZOFRAN) 8 MG tablet Take 1 tablet (8 mg total) by mouth 2 (two) times daily as needed for refractory nausea / vomiting. Start on day 3 after carboplatin chemo. 30 tablet 1   oxyCODONE (OXY IR/ROXICODONE) 5 MG immediate release tablet Take 1 tablet (5 mg total) by mouth every 6 (six) hours as needed for severe pain. 60 tablet 0   polyethylene glycol (MIRALAX / GLYCOLAX) 17 g packet Take 17 g by mouth 2 (two) times daily. (Patient taking differently: Take 17 g by mouth daily as needed.) 14 each 0   prochlorperazine (COMPAZINE) 10 MG tablet Take 1 tablet (10 mg total) by mouth every 6 (six) hours as needed (Nausea or vomiting). (Patient not taking: Reported on 03/11/2021) 30 tablet 1   No current facility-administered medications for this visit.   Facility-Administered Medications Ordered in Other Visits  Medication Dose Route Frequency Provider Last Rate Last Admin   0.9 %  sodium chloride infusion   Intravenous Continuous Sindy Guadeloupe, MD        VITAL SIGNS: BP 106/74    Pulse 100    Temp 98.4 F (36.9 C)    Resp 16    SpO2 99%  There were no vitals filed for this visit.  Estimated body mass index is 24.86 kg/m as calculated from the following:   Height as of 03/11/21: 5' 7"  (1.702 m).   Weight as of 03/11/21: 158 lb 11.2 oz  (72 kg).  LABS: CBC:    Component Value Date/Time   WBC 7.9 03/18/2021 0841   HGB 11.3 (L) 03/18/2021 0841   HGB 15.2 02/14/2013 1204   HCT 33.1 (L) 03/18/2021 0841   HCT 43.6 02/14/2013 1204   PLT 194 03/18/2021 0841   PLT 188 02/14/2013 1204   MCV 84.2 03/18/2021 0841   MCV 85 02/14/2013 1204   NEUTROABS PENDING 03/18/2021 0841   LYMPHSABS PENDING 03/18/2021 0841   MONOABS PENDING 03/18/2021 0841   EOSABS PENDING 03/18/2021 0841   BASOSABS PENDING 03/18/2021 0841   Comprehensive Metabolic Panel:    Component Value Date/Time   NA 128 (L) 03/18/2021 5027  NA 125 (L) 01/07/2016 1322   NA 127 (L) 02/14/2013 1204   K 2.8 (L) 03/18/2021 0841   K 3.8 02/14/2013 1204   CL 91 (L) 03/18/2021 0841   CL 93 (L) 02/14/2013 1204   CO2 30 03/18/2021 0841   CO2 28 02/14/2013 1204   BUN 6 (L) 03/18/2021 0841   BUN 12 01/07/2016 1322   BUN 21 (H) 02/14/2013 1204   CREATININE 0.59 (L) 03/18/2021 0841   CREATININE 1.63 (H) 02/14/2013 1204   GLUCOSE 157 (H) 03/18/2021 0841   GLUCOSE 420 (H) 02/14/2013 1204   CALCIUM 7.8 (L) 03/18/2021 0841   CALCIUM 8.8 02/14/2013 1204   AST 18 03/11/2021 0814   AST 31 02/14/2013 1204   ALT 15 03/11/2021 0814   ALT 32 02/14/2013 1204   ALKPHOS 102 03/11/2021 0814   ALKPHOS 121 (H) 02/14/2013 1204   BILITOT 0.7 03/11/2021 0814   BILITOT 1.0 01/07/2016 1322   BILITOT 1.0 02/14/2013 1204   PROT 6.4 (L) 03/11/2021 0814   PROT 5.2 (L) 01/07/2016 1322   PROT 7.1 02/14/2013 1204   ALBUMIN 3.2 (L) 03/11/2021 0814   ALBUMIN 2.8 (L) 01/07/2016 1322   ALBUMIN 3.5 02/14/2013 1204    RADIOGRAPHIC STUDIES: PERIPHERAL VASCULAR CATHETERIZATION  Result Date: 03/07/2021 See surgical note for result.  NM PET Image Restag (PS) Skull Base To Thigh  Result Date: 02/22/2021 CLINICAL DATA:  Subsequent treatment strategy for carcinoma of the tongue base. EXAM: NUCLEAR MEDICINE PET SKULL BASE TO THIGH TECHNIQUE: 8.9 mCi F-18 FDG was injected intravenously.  Full-ring PET imaging was performed from the skull base to thigh after the radiotracer. CT data was obtained and used for attenuation correction and anatomic localization. Fasting blood glucose: 71 mg/dl COMPARISON:  Multiple exams, including 08/10/2019 FINDINGS: Mediastinal blood pool activity: SUV max 2.3 Liver activity: SUV max NA NECK: Extensive multifocal infiltrative malignancy in the left neck musculature, notably the left scalene musculature, left longissimus cervicis, left levator scapulae, left obliquus capitis, left rectus capitis, the posterior belly of the digastric, and tracking into the longus capitis muscular region to extend across midline in the vicinity of the posterior nasopharynx. This extends to the margin of the left occipital skull although gross penetration into the skull is not well shown. There is tumor extending up into the left semispinalis capitis muscle towards the occiput, and infiltrative tumor involving the left sternocleidomastoid muscle with obliteration of normal fat planes and tissue planes along the internal jugular space. The left internal jugular space infiltrative process has a maximum SUV of 7.4, previously 6.9, and has substantially worsened compared to the prior PET-CT. Along the lower lateral margin of the posterior scalene muscle, a 3.6 by 2.7 cm mass has a maximum SUV of 13.1. The left upper cervical paraspinal tumor has representative SUV of about 10.5, and is shown to extend into the left side of the spinal canal in the epidural space at the C1-2 level causing some rightward deviation of the cord as shown on image 23 series 3. There is also a rounded tumor deposit measuring about 2.2 by 2.4 cm on image 21 of series 3 in the RIGHT (contralateral) upper neck paraspinal musculature in the vicinity of the right rectus capitis posterior muscle, maximum SUV 9.0. An indistinctly marginated left level III lymph node measuring about 2.0 cm in short axis on image 49 series 3  has a maximum SUV of 10.6. A left level IV lymph node is present measuring 1.3 cm in short axis on  image sixty of series 3 with maximum SUV 9.3. There is an immediately adjacent supraclavicular lymph node measuring 1.4 cm in short axis on image 67 of series 3 with maximum SUV 12.2. A right level IIb lymph node measuring 0.7 cm in short axis on image 29 series 3 has a maximum SUV of 3.3. Reduced intracranial metabolic activity in the right temporal lobe with associated adjacent dilation of the temporal horn of the right lateral ventricle. Asymmetric activity of the vocal cords, the right cord having a maximum SUV of 3.5 and the left having a maximum SUV of 2.7, cannot exclude left vocal cord paralysis as a potential cause. There is bony discontinuity in the left transverse process of C1 extending into the transverse foramen on image 25 of series 3 compatible with fracture, with mild sclerosis in the left transverse process. Tumor invasion and/or radiation therapy in the region may be contributory or causative. If there is concern regarding patency of the left vertebral artery, CTA or ultrasound could be utilized. Please note that tumor on the left side is in close proximity to left skull base foramina, and extension of tumor into or through the foramina is difficult to confidently exclude on today's PET-CT. Incidental CT findings: Mild chronic left maxillary sinusitis. CHEST: There is a hypermetabolic left supraclavicular lymph node but this is discussed above in the neck section given its proximity to adjacent level IV adenopathy. A left upper lobe subpleural nodule measuring 9 by 5 mm on image 104 of series 3 has a maximum SUV of 1.5, but is only borderline for sensitive PET-CT assessment. This nodule was not appreciable on 12/23/2016. An 8 by 6 mm lingular nodule on image 119 of series 3 has a maximum SUV of 1.7, and is likewise borderline for sensitive PET-CT size assessment. This nodule was not present on  12/23/2016. A 4 mm left lower lobe nodule shown on image 116 series 3 previously measured 3 mm in diameter on 12/23/2016, and is not hypermetabolic. My goal Incidental CT findings: Coronary, aortic arch, and branch vessel atherosclerotic vascular disease. Airway thickening and airway plugging in both lower lobes with some scattered ground-glass opacities in the lower lobes. A 5 mm right lower lobe pulmonary nodule on image 115 of series 3 is not readily apparent on the 12/23/2016 exam, and not appreciably hypermetabolic. However, some of the ground-glass density in the right lower lobe is somewhat hypermetabolic including a 1 cm confluence of ground-glass density medially in the right lower lobe on image 119 of series 3 with maximum SUV of 2.7. Based on the density of this process I favor it as being inflammatory, but surveillance may be warranted. ABDOMEN/PELVIS: No significant abnormal hypermetabolic activity in this region. Incidental CT findings: Right kidney upper pole photopenic cyst. Atherosclerosis is present, including aortoiliac atherosclerotic disease. Sigmoid colon diverticulosis. Heterotopic ossification in the midline upper abdomen anteriorly measuring 5.7 cm in length. SKELETON: Tumor invasion of the left side of the spinal canal at the C1-2 level and fracture of the left C1 transverse process as noted in the neck section above. No further findings of osseous tumor identified. Incidental CT findings: Degenerative glenohumeral arthropathy on the right. Dextroconvex lumbar scoliosis. IMPRESSION: 1. Extensive progression of infiltrative and nodular tumor involvement of the left neck and to a lesser extent at the right neck as discussed in detail above. This includes internal jugular adenopathy, extensive muscle involvement, invasion of the spinal canal at the C1-2 level, and abutment of the skull base in the  vicinity of the skull-base foramina such that foraminal invasion cannot be readily excluded  although no overt bony destruction of the skull is currently appreciable. Tumor extends primarily down the left neck to the level of the thoracic inlet and is associated with lower neck adenopathy and infiltrative tumor in the upper internal jugular space. 2. Fracture left transverse process at C1 extending into the transverse foramen, radiation therapy and/or tumor may be contributory. If there is clinical concern regarding patency of the left vertebral artery, CT angiography of the neck could be employed. 3. Two nodules in the left lung average about 7 mm in diameter and demonstrate only low-grade activity, but these are at the borderline of accurate characterization by PET-CT and surveillance is recommended to exclude metastatic disease. 4. Bilateral lower lobe airway thickening with some plugging and ground-glass opacities. In the right lower lobe some of the ground-glass opacity has accentuated metabolic activity but I still favor this as being inflammatory, surveillance suggested. 5. Other imaging findings of potential clinical significance: Mild chronic left maxillary sinusitis. Aortic Atherosclerosis (ICD10-I70.0). Coronary atherosclerosis. Sigmoid colon diverticulosis. Dextroconvex lumbar scoliosis. Electronically Signed   By: Van Clines M.D.   On: 02/22/2021 14:00    PERFORMANCE STATUS (ECOG) : 2 - Symptomatic, <50% confined to bed  Review of Systems Unless otherwise noted, a complete review of systems is negative.  Physical Exam General: NAD HEENT: Edentulous, no thrush or ulcerations Pulmonary: Unlabored Extremities: no edema, no joint deformities Skin: no rashes Neurological: Weakness but otherwise nonfocal  IMPRESSION: Patient reports doing reasonably well.  He does endorse burning when he tries to eat/drink but denies and still has phlegm that is worse at night when he lies flat.  No thrush or overt mucositis noted on exam.  Would recommend baking soda/salt rinses as  conservative treatment.  Can advance to Magic mouthwash if this is ineffective but I am hesitant to use lidocaine given concern that this could increase risk of aspiration.  He reports pain is improved on oxycodone.  Patient brought advance directives, which are now on file.  His sister is his HC POA.  Living will is signed.  Patient says that he does not think he would want to be resuscitated or have aggressive measures such as machines at end-of-life.  He called this "torture".  I sent him home with a MOST form to review with his family.  PLAN: -Continue current scope of treatment -IV fluids and replete K today -Start K-Dur and Os-Cal/vitamin D -ACP documents on file -Patient sent home with a MOST form to review with family -Daily bowel regimen as needed -Baking soda/salt rinses -Consider referral to establish with [facility] -RTC for labs/fluids in 1 week -Follow-up telephone visit 1 month   Patient expressed understanding and was in agreement with this plan. He also understands that He can call the clinic at any time with any questions, concerns, or complaints.     Time Total: 15 minutes  Visit consisted of counseling and education dealing with the complex and emotionally intense issues of symptom management and palliative care in the setting of serious and potentially life-threatening illness.Greater than 50%  of this time was spent counseling and coordinating care related to the above assessment and plan.  Signed by: Altha Harm, PhD, NP-C

## 2021-03-18 NOTE — Progress Notes (Signed)
Putting in labs

## 2021-03-19 ENCOUNTER — Ambulatory Visit: Payer: Medicare Other

## 2021-03-20 ENCOUNTER — Ambulatory Visit: Payer: Medicare Other

## 2021-03-20 ENCOUNTER — Inpatient Hospital Stay: Payer: Medicare Other

## 2021-03-20 NOTE — Progress Notes (Signed)
Nutrition Follow-up:  Patient with recurrent base of tongue cancer.  Patient has started chemotherapy, refused radiation.   Spoke with patient's sister via phone.  Reports that appetite is good and eating but not eating as much as he used too.  Says that patient has been using baking soda and salt water rinses and mouth is better.  Sister said that she got him some of the ensure clear and he is drinking some of those but not daily. Usually eating oatmeal for breakfast.  Grilled cheese sandwich for lunch. Supper is usually meat and couple of sides.    Medications: reviewed  Labs: reviewed  Anthropometrics:   Weight 158 lb on 1/17  159 lb 3.2 oz on 1/4  UBW in 180s   NUTRITION DIAGNOSIS: Unintentional weight loss continues   INTERVENTION:  Recommend ensure clear shakes 1-2 times per day Continue high calorie, high protein foods for weight maintenance Continue baking soda and salt water rinses    MONITORING, EVALUATION, GOAL:  Weight trends, intake   NEXT VISIT: Wednesday, Feb 8 phone call  Karen Huhta B. Zenia Resides, [place], [place] Registered Dietitian [phone] (mobile)

## 2021-03-21 ENCOUNTER — Other Ambulatory Visit: Payer: Medicare Other

## 2021-03-21 ENCOUNTER — Ambulatory Visit: Payer: Medicare Other | Admitting: Nurse Practitioner

## 2021-03-21 ENCOUNTER — Ambulatory Visit: Payer: Medicare Other

## 2021-03-21 ENCOUNTER — Encounter: Payer: Medicare Other | Admitting: Hospice and Palliative Medicine

## 2021-03-22 ENCOUNTER — Ambulatory Visit: Payer: Medicare Other

## 2021-03-25 ENCOUNTER — Ambulatory Visit: Payer: Medicare Other

## 2021-03-25 ENCOUNTER — Inpatient Hospital Stay: Payer: Medicare Other

## 2021-03-25 ENCOUNTER — Other Ambulatory Visit: Payer: Self-pay

## 2021-03-25 ENCOUNTER — Other Ambulatory Visit: Payer: Self-pay | Admitting: *Deleted

## 2021-03-25 VITALS — BP 136/81 | HR 82 | Temp 98.8°F

## 2021-03-25 DIAGNOSIS — E860 Dehydration: Secondary | ICD-10-CM

## 2021-03-25 DIAGNOSIS — Z7189 Other specified counseling: Secondary | ICD-10-CM

## 2021-03-25 DIAGNOSIS — C01 Malignant neoplasm of base of tongue: Secondary | ICD-10-CM

## 2021-03-25 DIAGNOSIS — Z5112 Encounter for antineoplastic immunotherapy: Secondary | ICD-10-CM | POA: Diagnosis not present

## 2021-03-25 DIAGNOSIS — E876 Hypokalemia: Secondary | ICD-10-CM

## 2021-03-25 LAB — COMPREHENSIVE METABOLIC PANEL
ALT: 21 U/L (ref 0–44)
AST: 19 U/L (ref 15–41)
Albumin: 3.3 g/dL — ABNORMAL LOW (ref 3.5–5.0)
Alkaline Phosphatase: 134 U/L — ABNORMAL HIGH (ref 38–126)
Anion gap: 10 (ref 5–15)
BUN: 14 mg/dL (ref 8–23)
CO2: 28 mmol/L (ref 22–32)
Calcium: 8.5 mg/dL — ABNORMAL LOW (ref 8.9–10.3)
Chloride: 93 mmol/L — ABNORMAL LOW (ref 98–111)
Creatinine, Ser: 0.64 mg/dL (ref 0.61–1.24)
GFR, Estimated: >60 mL/min (ref >60)
Glucose, Bld: 105 mg/dL — ABNORMAL HIGH (ref 70–99)
Potassium: 3.5 mmol/L (ref 3.5–5.1)
Sodium: 131 mmol/L — ABNORMAL LOW (ref 135–145)
Total Bilirubin: 0.5 mg/dL (ref 0.3–1.2)
Total Protein: 6.6 g/dL (ref 6.5–8.1)

## 2021-03-25 LAB — MAGNESIUM: Magnesium: 1.7 mg/dL (ref 1.7–2.4)

## 2021-03-25 MED ORDER — SODIUM CHLORIDE 0.9% FLUSH
10.0000 mL | Freq: Once | INTRAVENOUS | Status: AC
  Administered 2021-03-25: 10 mL via INTRAVENOUS
  Filled 2021-03-25: qty 10

## 2021-03-25 MED ORDER — HEPARIN SOD (PORK) LOCK FLUSH 100 UNIT/ML IV SOLN
500.0000 [IU] | Freq: Once | INTRAVENOUS | Status: AC
  Administered 2021-03-25: 500 [IU] via INTRAVENOUS
  Filled 2021-03-25: qty 5

## 2021-03-25 MED ORDER — SODIUM CHLORIDE 0.9 % IV SOLN
Freq: Once | INTRAVENOUS | Status: AC
  Filled 2021-03-25: qty 250

## 2021-03-25 NOTE — Progress Notes (Signed)
Receiving 1 L NS. Reports being hungry. Pt given applesauce,chips and peanut butter crackers. Denies any nausea. Drinking gator aid. Discharged to home. Accompanied by his cousin Jeneen Rinks.

## 2021-03-26 ENCOUNTER — Ambulatory Visit: Payer: Medicare Other

## 2021-03-27 ENCOUNTER — Ambulatory Visit: Payer: Medicare Other

## 2021-03-28 ENCOUNTER — Ambulatory Visit: Payer: Medicare Other

## 2021-03-29 ENCOUNTER — Ambulatory Visit: Payer: Medicare Other

## 2021-04-01 ENCOUNTER — Inpatient Hospital Stay: Payer: Medicare Other

## 2021-04-01 ENCOUNTER — Inpatient Hospital Stay (HOSPITAL_BASED_OUTPATIENT_CLINIC_OR_DEPARTMENT_OTHER): Payer: Medicare Other | Admitting: Oncology

## 2021-04-01 ENCOUNTER — Ambulatory Visit: Payer: Medicare Other

## 2021-04-01 ENCOUNTER — Telehealth: Payer: Self-pay | Admitting: *Deleted

## 2021-04-01 ENCOUNTER — Encounter: Payer: Self-pay | Admitting: Oncology

## 2021-04-01 ENCOUNTER — Other Ambulatory Visit: Payer: Self-pay

## 2021-04-01 VITALS — BP 112/82 | HR 84

## 2021-04-01 VITALS — BP 93/71 | HR 90 | Temp 99.3°F | Resp 16 | Wt 153.1 lb

## 2021-04-01 DIAGNOSIS — C01 Malignant neoplasm of base of tongue: Secondary | ICD-10-CM

## 2021-04-01 DIAGNOSIS — Z7189 Other specified counseling: Secondary | ICD-10-CM

## 2021-04-01 DIAGNOSIS — Z95828 Presence of other vascular implants and grafts: Secondary | ICD-10-CM

## 2021-04-01 DIAGNOSIS — Z5112 Encounter for antineoplastic immunotherapy: Secondary | ICD-10-CM

## 2021-04-01 DIAGNOSIS — Z5111 Encounter for antineoplastic chemotherapy: Secondary | ICD-10-CM

## 2021-04-01 DIAGNOSIS — R6889 Other general symptoms and signs: Secondary | ICD-10-CM

## 2021-04-01 LAB — COMPREHENSIVE METABOLIC PANEL
ALT: 23 U/L (ref 0–44)
AST: 17 U/L (ref 15–41)
Albumin: 3 g/dL — ABNORMAL LOW (ref 3.5–5.0)
Alkaline Phosphatase: 101 U/L (ref 38–126)
Anion gap: 7 (ref 5–15)
BUN: 12 mg/dL (ref 8–23)
CO2: 28 mmol/L (ref 22–32)
Calcium: 8.2 mg/dL — ABNORMAL LOW (ref 8.9–10.3)
Chloride: 92 mmol/L — ABNORMAL LOW (ref 98–111)
Creatinine, Ser: 0.69 mg/dL (ref 0.61–1.24)
GFR, Estimated: >60 mL/min (ref >60)
Glucose, Bld: 108 mg/dL — ABNORMAL HIGH (ref 70–99)
Potassium: 3.9 mmol/L (ref 3.5–5.1)
Sodium: 127 mmol/L — ABNORMAL LOW (ref 135–145)
Total Bilirubin: 1.1 mg/dL (ref 0.3–1.2)
Total Protein: 6.1 g/dL — ABNORMAL LOW (ref 6.5–8.1)

## 2021-04-01 LAB — CBC WITH DIFFERENTIAL/PLATELET
Abs Immature Granulocytes: 0.29 10*3/uL — ABNORMAL HIGH (ref 0.00–0.07)
Basophils Absolute: 0.1 10*3/uL (ref 0.0–0.1)
Basophils Relative: 0 %
Eosinophils Absolute: 0 10*3/uL (ref 0.0–0.5)
Eosinophils Relative: 0 %
HCT: 33.5 % — ABNORMAL LOW (ref 39.0–52.0)
Hemoglobin: 11.5 g/dL — ABNORMAL LOW (ref 13.0–17.0)
Immature Granulocytes: 1 %
Lymphocytes Relative: 5 %
Lymphs Abs: 1.1 10*3/uL (ref 0.7–4.0)
MCH: 29.3 pg (ref 26.0–34.0)
MCHC: 34.3 g/dL (ref 30.0–36.0)
MCV: 85.2 fL (ref 80.0–100.0)
Monocytes Absolute: 1.3 10*3/uL — ABNORMAL HIGH (ref 0.1–1.0)
Monocytes Relative: 6 %
Neutro Abs: 21.6 10*3/uL — ABNORMAL HIGH (ref 1.7–7.7)
Neutrophils Relative %: 88 %
Platelets: 188 10*3/uL (ref 150–400)
RBC: 3.93 MIL/uL — ABNORMAL LOW (ref 4.22–5.81)
RDW: 14.9 % (ref 11.5–15.5)
WBC: 24.5 10*3/uL — ABNORMAL HIGH (ref 4.0–10.5)
nRBC: 0 % (ref 0.0–0.2)

## 2021-04-01 LAB — TSH: TSH: 6.052 u[IU]/mL — ABNORMAL HIGH (ref 0.350–4.500)

## 2021-04-01 MED ORDER — SODIUM CHLORIDE 0.9% FLUSH
10.0000 mL | Freq: Once | INTRAVENOUS | Status: AC
  Administered 2021-04-01: 10 mL via INTRAVENOUS
  Filled 2021-04-01: qty 10

## 2021-04-01 MED ORDER — FAMOTIDINE IN NACL 20-0.9 MG/50ML-% IV SOLN
20.0000 mg | Freq: Once | INTRAVENOUS | Status: AC
  Administered 2021-04-01: 20 mg via INTRAVENOUS
  Filled 2021-04-01: qty 50

## 2021-04-01 MED ORDER — HEPARIN SOD (PORK) LOCK FLUSH 100 UNIT/ML IV SOLN
500.0000 [IU] | Freq: Once | INTRAVENOUS | Status: AC | PRN
  Administered 2021-04-01: 500 [IU]
  Filled 2021-04-01: qty 5

## 2021-04-01 MED ORDER — SODIUM CHLORIDE 0.9 % IV SOLN
Freq: Once | INTRAVENOUS | Status: AC
  Filled 2021-04-01: qty 250

## 2021-04-01 MED ORDER — PALONOSETRON HCL INJECTION 0.25 MG/5ML
0.2500 mg | Freq: Once | INTRAVENOUS | Status: AC
  Administered 2021-04-01: 0.25 mg via INTRAVENOUS
  Filled 2021-04-01: qty 5

## 2021-04-01 MED ORDER — DIPHENHYDRAMINE HCL 50 MG/ML IJ SOLN
50.0000 mg | Freq: Once | INTRAMUSCULAR | Status: AC
  Administered 2021-04-01: 50 mg via INTRAVENOUS
  Filled 2021-04-01: qty 1

## 2021-04-01 MED ORDER — SODIUM CHLORIDE 0.9 % IV SOLN
10.0000 mg | Freq: Once | INTRAVENOUS | Status: AC
  Administered 2021-04-01: 10 mg via INTRAVENOUS
  Filled 2021-04-01: qty 10

## 2021-04-01 MED ORDER — SODIUM CHLORIDE 0.9 % IV SOLN
150.0000 mg | Freq: Once | INTRAVENOUS | Status: AC
  Administered 2021-04-01: 150 mg via INTRAVENOUS
  Filled 2021-04-01: qty 150

## 2021-04-01 MED ORDER — SODIUM CHLORIDE 0.9 % IV SOLN
200.0000 mg | Freq: Once | INTRAVENOUS | Status: AC
  Administered 2021-04-01: 200 mg via INTRAVENOUS
  Filled 2021-04-01: qty 8

## 2021-04-01 MED ORDER — MAGIC MOUTHWASH W/LIDOCAINE: 5.0000 mL | Freq: Four times a day (QID) | ORAL | 1 refills | Status: DC | PRN

## 2021-04-01 MED ORDER — SODIUM CHLORIDE 0.9 % IV SOLN
175.0000 mg/m2 | Freq: Once | INTRAVENOUS | Status: AC
  Administered 2021-04-01: 324 mg via INTRAVENOUS
  Filled 2021-04-01: qty 54

## 2021-04-01 MED ORDER — SODIUM CHLORIDE 0.9 % IV SOLN
495.5000 mg | Freq: Once | INTRAVENOUS | Status: AC
  Administered 2021-04-01: 500 mg via INTRAVENOUS
  Filled 2021-04-01: qty 50

## 2021-04-01 NOTE — Progress Notes (Signed)
Labs, vitals reviewed by MD. OK to proceed with treatment today.

## 2021-04-01 NOTE — Addendum Note (Signed)
Addended by: Luella Cook on: 04/01/2021 08:58 PM   Modules accepted: Orders

## 2021-04-01 NOTE — Progress Notes (Signed)
Patient here for pre treatment check. He states his appetite is improved. He also reports constipation he has not been taking Miralax as prescribed. Writer encouraged patient and family to start using Miralax.No questions today.

## 2021-04-01 NOTE — Telephone Encounter (Signed)
[facility] that pt needs magic mouthwash with lidocaine. They said that they can make it but it does not go through insurance and it will cost 20.00 for 120 ml. Mickel Baas took a  verbal order and will get it ready.

## 2021-04-01 NOTE — Patient Instructions (Signed)
[facility]  Discharge Instructions: ?Thank you for choosing [facility] to provide your oncology and hematology care.  ?If you have a lab appointment with the Cancer Center, please go directly to the Cancer Center and check in at the registration area. ? ?Wear comfortable clothing and clothing appropriate for easy access to any Portacath or PICC line.  ? ?We strive to give you quality time with your provider. You may need to reschedule your appointment if you arrive late (15 or more minutes).  Arriving late affects you and other patients whose appointments are after yours.  Also, if you miss three or more appointments without notifying the office, you may be dismissed from the clinic at the provider?s discretion.    ?  ?For prescription refill requests, have your pharmacy contact our office and allow 72 hours for refills to be completed.   ? ?  ?To help prevent nausea and vomiting after your treatment, we encourage you to take your nausea medication as directed. ? ?BELOW ARE SYMPTOMS THAT SHOULD BE REPORTED IMMEDIATELY: ?*FEVER GREATER THAN 100.4 F (38 ?C) OR HIGHER ?*CHILLS OR SWEATING ?*NAUSEA AND VOMITING THAT IS NOT CONTROLLED WITH YOUR NAUSEA MEDICATION ?*UNUSUAL SHORTNESS OF BREATH ?*UNUSUAL BRUISING OR BLEEDING ?*URINARY PROBLEMS (pain or burning when urinating, or frequent urination) ?*BOWEL PROBLEMS (unusual diarrhea, constipation, pain near the anus) ?TENDERNESS IN MOUTH AND THROAT WITH OR WITHOUT PRESENCE OF ULCERS (sore throat, sores in mouth, or a toothache) ?UNUSUAL RASH, SWELLING OR PAIN  ?UNUSUAL VAGINAL DISCHARGE OR ITCHING  ? ?Items with * indicate a potential emergency and should be followed up as soon as possible or go to the Emergency Department if any problems should occur. ? ?Please show the CHEMOTHERAPY ALERT CARD or IMMUNOTHERAPY ALERT CARD at check-in to the Emergency Department and triage nurse. ? ?Should you have questions after your visit  or need to cancel or reschedule your appointment, please contact [facility]  [phone] and follow the prompts.  Office hours are 8:00 a.m. to 4:30 p.m. Monday - Friday. Please note that voicemails left after 4:00 p.m. may not be returned until the following business day.  We are closed weekends and major holidays. You have access to a nurse at all times for urgent questions. Please call the main number to the clinic [phone] and follow the prompts. ? ?For any non-urgent questions, you may also contact your provider using MyChart. We now offer e-Visits for anyone 18 and older to request care online for non-urgent symptoms. For details visit [email or web address]. ?  ?Also download the MyChart app! Go to the app store, search "MyChart", open the app, select [facility], and log in with your MyChart username and password. ? ?Due to Covid, a mask is required upon entering the hospital/clinic. If you do not have a mask, one will be given to you upon arrival. For doctor visits, patients may have 1 support person aged 18 or older with them. For treatment visits, patients cannot have anyone with them due to current Covid guidelines and our immunocompromised population.  ?

## 2021-04-01 NOTE — Progress Notes (Signed)
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 Mike Gip in the past.  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  Tumor was p16 positive.  He received concurrent chemoradiation with 3 cycles of cisplatin.  He has had a gradually progressive infiltrative malignancy since then PD-L1 was checked on his original biopsy specimen with a CPS score of 1.  No reportable actionable mutations.  Carbo 5-FU Keytruda regimen was discussed by Dr. Mike Gip back in September 2021 but patient ultimately did notFollow-up.  More recently patient was noted to have more pain in his neck and underwent a repeat PET scan which shows extensive progression of infiltrative and nodular tumor involving the left neck along with internal jugular adenopathy.  Invasion of spinal canal at C1-C2 level with extensive muscle involvement and abutment of skull base.  Fracture of the left transverse process of C1.  Borderline low-grade activity noted into lung nodule 7 mm in size but otherwise no evidence of distant metastatic disease  He is here to discuss systemic options   He has declined radiation treatment given concern for toxicity but is agreeable to proceed with chemotherapy  Interval history-tolerating chemotherapy well so far.  Denies any significant pain in his face especially during the day but he does take pain medication at night.  Appetite is stable.  He does report having constipation.   States that his last bowel movement was about 3 to 4 days ago  ECOG PS- 2 Pain scale- 2 Opioid associated constipation- no  Review of systems- Review of Systems  Constitutional:  Positive for malaise/fatigue. Negative for chills, fever and weight loss.  HENT:  Negative for congestion, ear discharge and nosebleeds.   Eyes:  Negative for blurred vision.  Respiratory:  Negative for cough, hemoptysis, sputum production, shortness of breath and wheezing.   Cardiovascular:  Negative for chest pain, palpitations, orthopnea and claudication.  Gastrointestinal:  Negative for abdominal pain, blood in stool, constipation, diarrhea, heartburn, melena, nausea and vomiting.  Genitourinary:  Negative for dysuria, flank pain, frequency, hematuria and urgency.  Musculoskeletal:  Negative for back pain, joint pain and myalgias.  Skin:  Negative for rash.  Neurological:  Negative for dizziness, tingling, focal weakness, seizures, weakness and headaches.  Endo/Heme/Allergies:  Does not bruise/bleed easily.  Psychiatric/Behavioral:  Negative for depression and suicidal ideas. The patient does not have insomnia.       No Known Allergies   Past Medical History:  Diagnosis Date   Arthritis    Diabetes mellitus without complication ([place])    Hyperlipidemia    Hypertension    Obesity    Osteomyelitis ([place])    From pt chart   Renal insufficiency    CKD noted on chart   Staph infection 05/29/2016   Nov 2017. 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   Tongue cancer ([place])    Stage IV squamous cell  carcinoma of the base of the tongue/left tonsil     Past Surgical History:  Procedure Laterality Date   EXCISION OF TONGUE LESION Left 06/19/2016   Procedure: EXCISION OF TONGUE LESION;  Surgeon: Margaretha Sheffield, MD;  Location: [place];  Service: ENT;  Laterality: Left;  diabetic - insulin and oral  meds - not using either currently   gastric tumor removed  2010   I & D EXTREMITY Left 01/09/2016   Procedure: IRRIGATION AND DEBRIDEMENT EXTREMITY;  Surgeon: Robert Bellow, MD;  Location: [facility];  Service: General;  Laterality: Left;   LARYNGOSCOPY N/A 06/19/2016   Procedure: LARYNGOSCOPY;  Surgeon: Margaretha Sheffield, MD;  Location: [place];  Service: ENT;  Laterality: N/A;   PERIPHERAL VASCULAR CATHETERIZATION Right 12/31/2015   Procedure: Lower Extremity Angiography;  Surgeon: Algernon Huxley, MD;  Location: [place] [facility];  Service: Cardiovascular;  Laterality: Right;   PORTA CATH INSERTION N/A 03/07/2021   Procedure: PORTA CATH INSERTION;  Surgeon: Algernon Huxley, MD;  Location: [place] [facility];  Service: Cardiovascular;  Laterality: N/A;   tongue mass biopsy      Social History   Socioeconomic History   Marital status: Married    Spouse name: Not on file   Number of children: Not on file   Years of education: Not on file   Highest education level: Not on file  Occupational History   Not on file  Tobacco Use   Smoking status: Never   Smokeless tobacco: Never  Vaping Use   Vaping Use: Never used  Substance and Sexual Activity   Alcohol use: No    Alcohol/week: 0.0 standard drinks   Drug use: No   Sexual activity: Not on file  Other Topics Concern   Not on file  Social History Narrative   Lives at home with wife. Ambulates independently.   Social Determinants of Health   Financial Resource Strain: Not on file  Food Insecurity: Not on file  Transportation Needs: Not on file  Physical Activity: Not on file  Stress: Not on file  Social Connections: Not on file  Intimate Partner Violence: Not on file    Family History  Problem Relation Age of Onset   Heart disease Mother    Hypertension Father    Cancer Sister        stomach   Heart disease Brother        MI   Cancer Daughter    Cancer Son        testicular   ADD / ADHD Sister      Current  Outpatient Medications:    amLODipine (NORVASC) 10 MG tablet, Take 10 mg by mouth daily., Disp: , Rfl:    aspirin EC 81 MG EC tablet, Take 1 tablet (81 mg total) by mouth daily., Disp:  , Rfl:    atorvastatin (LIPITOR) 40 MG tablet, Take 1 tablet (40 mg total) by mouth daily., Disp:  , Rfl:    calcium-vitamin D (OSCAL WITH D) 500-5 MG-MCG tablet, Take 1 tablet by mouth daily with breakfast., Disp: 30 tablet, Rfl: 0   dexamethasone (DECADRON) 4 MG tablet, Take 2 tablets (8 mg total) by mouth daily. Start the day after carboplatin chemotherapy for 3 days., Disp: 30 tablet, Rfl: 1   LANTUS SOLOSTAR 100 UNIT/ML Solostar Pen, Inject 18 Units into the skin at bedtime., Disp: , Rfl:    levothyroxine (SYNTHROID) 25 MCG tablet, Take 25 mcg by mouth daily., Disp: ,  Rfl:    lidocaine-prilocaine (EMLA) cream, Apply to affected area once, Disp: 30 g, Rfl: 3   LORazepam (ATIVAN) 0.5 MG tablet, Take 1 tablet (0.5 mg total) by mouth every 6 (six) hours as needed (Nausea or vomiting)., Disp: 30 tablet, Rfl: 0   losartan (COZAAR) 50 MG tablet, Take 50 mg by mouth daily., Disp: , Rfl:    ondansetron (ZOFRAN) 8 MG tablet, Take 1 tablet (8 mg total) by mouth 2 (two) times daily as needed for refractory nausea / vomiting. Start on day 3 after carboplatin chemo., Disp: 30 tablet, Rfl: 1   oxyCODONE (OXY IR/ROXICODONE) 5 MG immediate release tablet, Take 1 tablet (5 mg total) by mouth every 6 (six) hours as needed for severe pain., Disp: 60 tablet, Rfl: 0   polyethylene glycol (MIRALAX / GLYCOLAX) 17 g packet, Take 17 g by mouth 2 (two) times daily. (Patient taking differently: Take 17 g by mouth daily as needed.), Disp: 14 each, Rfl: 0   potassium chloride 20 MEQ/15ML (10%) SOLN, Take 15 mLs (20 mEq total) by mouth daily., Disp: 210 mL, Rfl: 0   prochlorperazine (COMPAZINE) 10 MG tablet, Take 1 tablet (10 mg total) by mouth every 6 (six) hours as needed (Nausea or vomiting). (Patient not taking: Reported on 03/11/2021), Disp:  30 tablet, Rfl: 1 No current facility-administered medications for this visit.  Facility-Administered Medications Ordered in Other Visits:    CARBOplatin (PARAPLATIN) 500 mg in sodium chloride 0.9 % 250 mL chemo infusion, 500 mg, Intravenous, Once, Sindy Guadeloupe, MD   PACLitaxel (TAXOL) 324 mg in sodium chloride 0.9 % 500 mL chemo infusion (> 16m/m2), 175 mg/m2 (Order-Specific), Intravenous, Once, RSindy Guadeloupe MD, Last Rate: 185 mL/hr at 04/01/21 1145, 324 mg at 04/01/21 1145  Physical exam:  Vitals:   04/01/21 0835  BP: 93/71  Pulse: 90  Resp: 16  Temp: 99.3 F (37.4 C)  TempSrc: Tympanic  SpO2: 92%  Weight: 153 lb 1.6 oz (69.4 kg)   Physical Exam Constitutional:      Comments: Sitting in a wheelchair.  Appears in no acute distress  HENT:     Mouth/Throat:     Mouth: Mucous membranes are moist.     Pharynx: Oropharynx is clear.  Neck:     Comments: Induration noted from prior radiation treatment.  Hard to palpate a distinct mass Cardiovascular:     Rate and Rhythm: Normal rate and regular rhythm.     Heart sounds: Normal heart sounds.  Pulmonary:     Effort: Pulmonary effort is normal.     Breath sounds: Normal breath sounds.  Abdominal:     General: Bowel sounds are normal.     Palpations: Abdomen is soft.  Skin:    General: Skin is warm and dry.  Neurological:     Mental Status: He is alert and oriented to person, place, and time.     CMP Latest Ref Rng & Units 04/01/2021  Glucose 70 - 99 mg/dL 108(H)  BUN 8 - 23 mg/dL 12  Creatinine 0.61 - 1.24 mg/dL 0.69  Sodium 135 - 145 mmol/L 127(L)  Potassium 3.5 - 5.1 mmol/L 3.9  Chloride 98 - 111 mmol/L 92(L)  CO2 22 - 32 mmol/L 28  Calcium 8.9 - 10.3 mg/dL 8.2(L)  Total Protein 6.5 - 8.1 g/dL 6.1(L)  Total Bilirubin 0.3 - 1.2 mg/dL 1.1  Alkaline Phos 38 - 126 U/L 101  AST 15 - 41 U/L 17  ALT 0 - 44 U/L 23  CBC Latest Ref Rng & Units 04/01/2021  WBC 4.0 - 10.5 K/uL 24.5(H)  Hemoglobin 13.0 - 17.0 g/dL 11.5(L)   Hematocrit 39.0 - 52.0 % 33.5(L)  Platelets 150 - 400 K/uL 188    No images are attached to the encounter.  PERIPHERAL VASCULAR CATHETERIZATION  Result Date: 03/07/2021 See surgical note for result.    Assessment and plan- Patient is a 66 y.o. 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  He is here for on treatment assessment prior to cycle 2 of CarboTaxol Keytruda chemotherapy  Counts okay to proceed with cycle 2 of CarboTaxol Keytruda today.  His white count is 24 and therefore I will avoid giving him urinate on day 3.  He has tolerated cycle 1 of treatment well without any significant side effects.  I will see him back in 3 weeks for cycle 3 followed by repeat scans.  He will be seen in the interim roughly 10 days from now for possible IV fluids  Neoplasm related pain: Patient will continue gabapentin as well as as needed oxycodone.  Abnormal thyroid function tests: Free T4 is normal.  TSH mildly elevated.  Continue to monitor.   Visit Diagnosis 1. Carcinoma of base of tongue ([place])   2. Encounter for antineoplastic chemotherapy   3. Encounter for antineoplastic immunotherapy      Dr. Randa Evens, MD, MPH [facility] [phone] 04/01/2021 12:19 PM

## 2021-04-02 ENCOUNTER — Other Ambulatory Visit: Payer: Self-pay | Admitting: Oncology

## 2021-04-02 ENCOUNTER — Ambulatory Visit: Payer: Medicare Other

## 2021-04-02 LAB — T4: T4, Total: 4.4 ug/dL — ABNORMAL LOW (ref 4.5–12.0)

## 2021-04-02 NOTE — Telephone Encounter (Signed)
°  Component Ref Range & Units 1 d ago (04/01/21) 8 d ago (03/25/21) 2 wk ago (03/18/21) 3 wk ago (03/11/21) 1 mo ago (03/01/21) 1 yr ago (07/02/19) 1 yr ago (06/27/19)  Potassium 3.5 - 5.1 mmol/L 3.9  3.5  2.8 Low   2.9 Low   3.2 Low   3.3 Low   3.5

## 2021-04-03 ENCOUNTER — Inpatient Hospital Stay: Payer: Medicare Other

## 2021-04-03 ENCOUNTER — Ambulatory Visit: Payer: Medicare Other

## 2021-04-04 ENCOUNTER — Ambulatory Visit: Payer: Medicare Other

## 2021-04-05 ENCOUNTER — Ambulatory Visit: Payer: Medicare Other

## 2021-04-08 ENCOUNTER — Ambulatory Visit: Payer: Medicare Other

## 2021-04-09 ENCOUNTER — Ambulatory Visit: Payer: Medicare Other

## 2021-04-09 ENCOUNTER — Encounter: Payer: Self-pay | Admitting: Hematology and Oncology

## 2021-04-10 ENCOUNTER — Inpatient Hospital Stay: Payer: Medicare Other | Attending: Oncology

## 2021-04-10 ENCOUNTER — Ambulatory Visit: Payer: Medicare Other

## 2021-04-10 ENCOUNTER — Other Ambulatory Visit: Payer: Self-pay

## 2021-04-10 NOTE — Progress Notes (Signed)
Nutrition Follow-up:  Patient with recurrent base of the tongue cancer.  Patient has started chemotherapy, refused radiation  Spoke with sister via phone.  She reports that patient is eating 3 meals per day and appetite is good.  Says that he is drinking ensure clear but not every day due to price.  Has mouth sores but using magic mouthwash and it is helping.    Medications: reviewed  Labs: reviewed  Anthropometrics:   Weight 153 lb 1.6 oz 1/30 158 lb on 1/17 159 lb 3.2 oz on 1/4  UBW in 180s   NUTRITION DIAGNOSIS: Unintentional weight loss continues   INTERVENTION:  Encouraged ensure clear shakes, ideally 1-2 times per day. Will mail coupons to sister Reviewed ways to add calories and protein in diet.     MONITORING, EVALUATION, GOAL: weight trends, intake   NEXT VISIT: Wednesday, March 8th phone call  Kent [name], [place], [place] Registered Dietitian [phone] (mobile)

## 2021-04-11 ENCOUNTER — Ambulatory Visit: Payer: Medicare Other

## 2021-04-12 ENCOUNTER — Inpatient Hospital Stay (HOSPITAL_BASED_OUTPATIENT_CLINIC_OR_DEPARTMENT_OTHER): Payer: Medicare Other | Admitting: Hospice and Palliative Medicine

## 2021-04-12 ENCOUNTER — Ambulatory Visit: Payer: Medicare Other

## 2021-04-12 ENCOUNTER — Inpatient Hospital Stay: Payer: Medicare Other

## 2021-04-12 ENCOUNTER — Other Ambulatory Visit: Payer: Self-pay

## 2021-04-12 ENCOUNTER — Inpatient Hospital Stay: Payer: Medicare Other | Attending: Nurse Practitioner | Admitting: Nurse Practitioner

## 2021-04-12 VITALS — BP 115/74 | HR 91 | Temp 97.9°F | Resp 16 | Wt 151.0 lb

## 2021-04-12 VITALS — BP 140/93

## 2021-04-12 DIAGNOSIS — Z7189 Other specified counseling: Secondary | ICD-10-CM

## 2021-04-12 DIAGNOSIS — R531 Weakness: Secondary | ICD-10-CM | POA: Diagnosis not present

## 2021-04-12 DIAGNOSIS — I959 Hypotension, unspecified: Secondary | ICD-10-CM | POA: Insufficient documentation

## 2021-04-12 DIAGNOSIS — E669 Obesity, unspecified: Secondary | ICD-10-CM | POA: Insufficient documentation

## 2021-04-12 DIAGNOSIS — C01 Malignant neoplasm of base of tongue: Secondary | ICD-10-CM | POA: Insufficient documentation

## 2021-04-12 DIAGNOSIS — Z7952 Long term (current) use of systemic steroids: Secondary | ICD-10-CM | POA: Insufficient documentation

## 2021-04-12 DIAGNOSIS — Z95828 Presence of other vascular implants and grafts: Secondary | ICD-10-CM

## 2021-04-12 DIAGNOSIS — Z794 Long term (current) use of insulin: Secondary | ICD-10-CM | POA: Insufficient documentation

## 2021-04-12 DIAGNOSIS — R590 Localized enlarged lymph nodes: Secondary | ICD-10-CM | POA: Insufficient documentation

## 2021-04-12 DIAGNOSIS — E119 Type 2 diabetes mellitus without complications: Secondary | ICD-10-CM | POA: Insufficient documentation

## 2021-04-12 DIAGNOSIS — I10 Essential (primary) hypertension: Secondary | ICD-10-CM | POA: Insufficient documentation

## 2021-04-12 DIAGNOSIS — Z9221 Personal history of antineoplastic chemotherapy: Secondary | ICD-10-CM | POA: Diagnosis not present

## 2021-04-12 DIAGNOSIS — M869 Osteomyelitis, unspecified: Secondary | ICD-10-CM | POA: Diagnosis not present

## 2021-04-12 DIAGNOSIS — M542 Cervicalgia: Secondary | ICD-10-CM | POA: Insufficient documentation

## 2021-04-12 DIAGNOSIS — D6481 Anemia due to antineoplastic chemotherapy: Secondary | ICD-10-CM | POA: Diagnosis not present

## 2021-04-12 DIAGNOSIS — D72829 Elevated white blood cell count, unspecified: Secondary | ICD-10-CM | POA: Insufficient documentation

## 2021-04-12 DIAGNOSIS — E785 Hyperlipidemia, unspecified: Secondary | ICD-10-CM | POA: Insufficient documentation

## 2021-04-12 DIAGNOSIS — E861 Hypovolemia: Secondary | ICD-10-CM | POA: Insufficient documentation

## 2021-04-12 DIAGNOSIS — Z79899 Other long term (current) drug therapy: Secondary | ICD-10-CM | POA: Insufficient documentation

## 2021-04-12 DIAGNOSIS — D701 Agranulocytosis secondary to cancer chemotherapy: Secondary | ICD-10-CM | POA: Diagnosis not present

## 2021-04-12 DIAGNOSIS — Z515 Encounter for palliative care: Secondary | ICD-10-CM | POA: Diagnosis not present

## 2021-04-12 DIAGNOSIS — Z5111 Encounter for antineoplastic chemotherapy: Secondary | ICD-10-CM | POA: Insufficient documentation

## 2021-04-12 DIAGNOSIS — R42 Dizziness and giddiness: Secondary | ICD-10-CM | POA: Diagnosis not present

## 2021-04-12 DIAGNOSIS — G893 Neoplasm related pain (acute) (chronic): Secondary | ICD-10-CM | POA: Insufficient documentation

## 2021-04-12 DIAGNOSIS — R5383 Other fatigue: Secondary | ICD-10-CM | POA: Diagnosis not present

## 2021-04-12 DIAGNOSIS — Z7982 Long term (current) use of aspirin: Secondary | ICD-10-CM | POA: Insufficient documentation

## 2021-04-12 DIAGNOSIS — I129 Hypertensive chronic kidney disease with stage 1 through stage 4 chronic kidney disease, or unspecified chronic kidney disease: Secondary | ICD-10-CM | POA: Insufficient documentation

## 2021-04-12 DIAGNOSIS — Z808 Family history of malignant neoplasm of other organs or systems: Secondary | ICD-10-CM | POA: Insufficient documentation

## 2021-04-12 DIAGNOSIS — E1122 Type 2 diabetes mellitus with diabetic chronic kidney disease: Secondary | ICD-10-CM | POA: Insufficient documentation

## 2021-04-12 DIAGNOSIS — T451X5A Adverse effect of antineoplastic and immunosuppressive drugs, initial encounter: Secondary | ICD-10-CM | POA: Diagnosis not present

## 2021-04-12 DIAGNOSIS — Z85028 Personal history of other malignant neoplasm of stomach: Secondary | ICD-10-CM | POA: Diagnosis not present

## 2021-04-12 LAB — CBC WITH DIFFERENTIAL/PLATELET
Abs Immature Granulocytes: 0 10*3/uL (ref 0.00–0.07)
Basophils Absolute: 0 10*3/uL (ref 0.0–0.1)
Basophils Relative: 2 %
Eosinophils Absolute: 0.1 10*3/uL (ref 0.0–0.5)
Eosinophils Relative: 4 %
HCT: 27.6 % — ABNORMAL LOW (ref 39.0–52.0)
Hemoglobin: 9.2 g/dL — ABNORMAL LOW (ref 13.0–17.0)
Immature Granulocytes: 0 %
Lymphocytes Relative: 21 %
Lymphs Abs: 0.5 10*3/uL — ABNORMAL LOW (ref 0.7–4.0)
MCH: 29.2 pg (ref 26.0–34.0)
MCHC: 33.3 g/dL (ref 30.0–36.0)
MCV: 87.6 fL (ref 80.0–100.0)
Monocytes Absolute: 0.4 10*3/uL (ref 0.1–1.0)
Monocytes Relative: 17 %
Neutro Abs: 1.2 10*3/uL — ABNORMAL LOW (ref 1.7–7.7)
Neutrophils Relative %: 56 %
Platelets: 178 10*3/uL (ref 150–400)
RBC: 3.15 MIL/uL — ABNORMAL LOW (ref 4.22–5.81)
RDW: 15.1 % (ref 11.5–15.5)
WBC: 2.2 10*3/uL — ABNORMAL LOW (ref 4.0–10.5)
nRBC: 0 % (ref 0.0–0.2)

## 2021-04-12 LAB — COMPREHENSIVE METABOLIC PANEL
ALT: 19 U/L (ref 0–44)
AST: 20 U/L (ref 15–41)
Albumin: 2.7 g/dL — ABNORMAL LOW (ref 3.5–5.0)
Alkaline Phosphatase: 100 U/L (ref 38–126)
Anion gap: 8 (ref 5–15)
BUN: 11 mg/dL (ref 8–23)
CO2: 26 mmol/L (ref 22–32)
Calcium: 8 mg/dL — ABNORMAL LOW (ref 8.9–10.3)
Chloride: 96 mmol/L — ABNORMAL LOW (ref 98–111)
Creatinine, Ser: 0.58 mg/dL — ABNORMAL LOW (ref 0.61–1.24)
GFR, Estimated: >60 mL/min (ref >60)
Glucose, Bld: 151 mg/dL — ABNORMAL HIGH (ref 70–99)
Potassium: 3.6 mmol/L (ref 3.5–5.1)
Sodium: 130 mmol/L — ABNORMAL LOW (ref 135–145)
Total Bilirubin: 0.3 mg/dL (ref 0.3–1.2)
Total Protein: 5.8 g/dL — ABNORMAL LOW (ref 6.5–8.1)

## 2021-04-12 MED ORDER — SODIUM CHLORIDE 0.9 % IV SOLN
INTRAVENOUS | Status: AC
  Filled 2021-04-12: qty 250

## 2021-04-12 MED ORDER — SODIUM CHLORIDE 0.9% FLUSH
10.0000 mL | Freq: Once | INTRAVENOUS | Status: AC
  Administered 2021-04-12: 10 mL via INTRAVENOUS
  Filled 2021-04-12: qty 10

## 2021-04-12 MED ORDER — HEPARIN SOD (PORK) LOCK FLUSH 100 UNIT/ML IV SOLN
500.0000 [IU] | Freq: Once | INTRAVENOUS | Status: AC
  Administered 2021-04-12: 500 [IU] via INTRAVENOUS
  Filled 2021-04-12: qty 5

## 2021-04-12 NOTE — Progress Notes (Signed)
[place] [facility] [phone] [phone]   Name: Kent [name] Date: 04/12/2021 MRN: [id number]  DOB: 1955-12-05  Patient Care Team: Earlie Server, MD as PCP - General (Oncology) Margaretha Sheffield, MD (Otolaryngology)    REASON FOR CONSULTATION: Kent [name] is a 66 y.o. 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  Patient met with Dr. Mike Gip following lymph node biopsy in September 2021 to discuss potential treatment options.  He was felt not to be a good surgical candidate.  Unfortunately, patient was ultimately lost to follow-up and then reestablished cancer care in December 2022.  Patient had an MRI of the brain at [facility] on 12/12/2020 revealing extensive metastatic disease with C1 and skull base invasion and left epidural canal infiltration without cord compression.  Patient has had severe neck and head pain.  Patient has been referred to radiation oncology.  Patient was referred to palliative care to help address goals.  SOCIAL HISTORY:     reports that he has never smoked. He has never used smokeless tobacco. He reports that he does not drink alcohol and does not use drugs.  Patient is divorced.  He lives at home with his sister.  Patient has a son and daughter.  Patient retired from [place] where he worked in the [employer] facility.  ADVANCE DIRECTIVES:  On file - sister is HCPOA  CODE STATUS:   PAST MEDICAL HISTORY: Past Medical History:  Diagnosis Date   Arthritis    Diabetes mellitus without complication ([place])    Hyperlipidemia    Hypertension    Obesity    Osteomyelitis ([place])    From pt chart   Renal insufficiency    CKD noted on chart   Staph infection 05/29/2016   Nov 2017. 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   Tongue cancer ([place])    Stage IV squamous cell carcinoma of the base of the tongue/left tonsil    PAST SURGICAL HISTORY:  Past Surgical History:  Procedure Laterality Date   EXCISION OF TONGUE LESION Left 06/19/2016   Procedure: EXCISION OF TONGUE LESION;  Surgeon: Margaretha Sheffield, MD;  Location: [place];  Service: ENT;  Laterality: Left;  diabetic - insulin and oral meds - not using either currently   gastric tumor removed  2010   I & D EXTREMITY Left 01/09/2016   Procedure: IRRIGATION AND DEBRIDEMENT EXTREMITY;  Surgeon: Robert Bellow, MD;  Location: [facility];  Service: General;  Laterality: Left;   LARYNGOSCOPY N/A 06/19/2016   Procedure: LARYNGOSCOPY;  Surgeon: Margaretha Sheffield, MD;  Location: [place];  Service: ENT;  Laterality: N/A;   PERIPHERAL VASCULAR CATHETERIZATION Right 12/31/2015   Procedure: Lower Extremity Angiography;  Surgeon: Algernon Huxley, MD;  Location: [place] [facility];  Service: Cardiovascular;  Laterality: Right;   PORTA CATH INSERTION N/A 03/07/2021   Procedure: PORTA CATH INSERTION;  Surgeon: Algernon Huxley, MD;  Location: [place] [facility];  Service: Cardiovascular;  Laterality: N/A;   tongue mass biopsy      HEMATOLOGY/ONCOLOGY HISTORY:  Oncology History  Carcinoma of base of tongue ([place])  09/10/2015 Initial Diagnosis   Carcinoma of base of tongue ([place])   03/11/2021 -  Chemotherapy   Patient is on Treatment Plan :  Carboplatin (5) + Paclitaxel (175) + Pembrolizumab (200) D1 q21d x 4 cycles / Pembrolizumab (200) Maintenance D1 q21d       ALLERGIES:  has No Known Allergies.  MEDICATIONS:  Current Outpatient Medications  Medication Sig Dispense Refill   amLODipine (NORVASC) 10 MG tablet Take 10 mg by mouth daily.     aspirin EC 81 MG EC tablet Take 1 tablet (81 mg total) by mouth daily.     atorvastatin (LIPITOR) 40 MG tablet Take 1 tablet (40 mg total) by mouth daily.      calcium-vitamin D (OSCAL WITH D) 500-5 MG-MCG tablet Take 1 tablet by mouth daily with breakfast. 30 tablet 0   dexamethasone (DECADRON) 4 MG tablet Take 2 tablets (8 mg total) by mouth daily. Start the day after carboplatin chemotherapy for 3 days. 30 tablet 1   LANTUS SOLOSTAR 100 UNIT/ML Solostar Pen Inject 18 Units into the skin at bedtime.     levothyroxine (SYNTHROID) 25 MCG tablet Take 25 mcg by mouth daily.     lidocaine-prilocaine (EMLA) cream Apply to affected area once 30 g 3   LORazepam (ATIVAN) 0.5 MG tablet Take 1 tablet (0.5 mg total) by mouth every 6 (six) hours as needed (Nausea or vomiting). 30 tablet 0   losartan (COZAAR) 50 MG tablet Take 50 mg by mouth daily.     magic mouthwash w/lidocaine SOLN Take 5 mLs by mouth 4 (four) times daily as needed for mouth pain. 120 mL 1   ondansetron (ZOFRAN) 8 MG tablet Take 1 tablet (8 mg total) by mouth 2 (two) times daily as needed for refractory nausea / vomiting. Start on day 3 after carboplatin chemo. 30 tablet 1   oxyCODONE (OXY IR/ROXICODONE) 5 MG immediate release tablet Take 1 tablet (5 mg total) by mouth every 6 (six) hours as needed for severe pain. 60 tablet 0   polyethylene glycol (MIRALAX / GLYCOLAX) 17 g packet Take 17 g by mouth 2 (two) times daily. (Patient taking differently: Take 17 g by mouth daily as needed.) 14 each 0   potassium chloride 20 MEQ/15ML (10%) SOLN TAKE 15 MLS BY MOUTH DAILY. 210 mL 0   prochlorperazine (COMPAZINE) 10 MG tablet Take 1 tablet (10 mg total) by mouth every 6 (six) hours as needed (Nausea or vomiting). (Patient not taking: Reported on 03/11/2021) 30 tablet 1   No current facility-administered medications for this visit.   Facility-Administered Medications Ordered in Other Visits  Medication Dose Route Frequency Provider Last Rate Last Admin   0.9 %  sodium chloride infusion   Intravenous Continuous Verlon Au, NP       heparin lock flush 100 unit/mL  500 Units Intravenous Once Verlon Au, NP       sodium chloride flush (NS) 0.9 % injection 10 mL  10 mL Intravenous Once Verlon Au, NP        VITAL SIGNS: There were no vitals taken for this visit. There were no vitals filed for this visit.  Estimated body mass index is 23.65 kg/m as calculated from the following:   Height as of 03/11/21: 5' 7"  (1.702 m).   Weight as of an earlier encounter on 04/12/21: 151 lb (68.5 kg).  LABS: CBC:    Component Value Date/Time   WBC 24.5 (H) 04/01/2021 0818   HGB 11.5 (L) 04/01/2021 0818   HGB 15.2 02/14/2013 1204   HCT 33.5 (L) 04/01/2021 0818  HCT 43.6 02/14/2013 1204   PLT 188 04/01/2021 0818   PLT 188 02/14/2013 1204   MCV 85.2 04/01/2021 0818   MCV 85 02/14/2013 1204   NEUTROABS 21.6 (H) 04/01/2021 0818   LYMPHSABS 1.1 04/01/2021 0818   MONOABS 1.3 (H) 04/01/2021 0818   EOSABS 0.0 04/01/2021 0818   BASOSABS 0.1 04/01/2021 0818   Comprehensive Metabolic Panel:    Component Value Date/Time   NA 127 (L) 04/01/2021 0818   NA 125 (L) 01/07/2016 1322   NA 127 (L) 02/14/2013 1204   K 3.9 04/01/2021 0818   K 3.8 02/14/2013 1204   CL 92 (L) 04/01/2021 0818   CL 93 (L) 02/14/2013 1204   CO2 28 04/01/2021 0818   CO2 28 02/14/2013 1204   BUN 12 04/01/2021 0818   BUN 12 01/07/2016 1322   BUN 21 (H) 02/14/2013 1204   CREATININE 0.69 04/01/2021 0818   CREATININE 1.63 (H) 02/14/2013 1204   GLUCOSE 108 (H) 04/01/2021 0818   GLUCOSE 420 (H) 02/14/2013 1204   CALCIUM 8.2 (L) 04/01/2021 0818   CALCIUM 8.8 02/14/2013 1204   AST 17 04/01/2021 0818   AST 31 02/14/2013 1204   ALT 23 04/01/2021 0818   ALT 32 02/14/2013 1204   ALKPHOS 101 04/01/2021 0818   ALKPHOS 121 (H) 02/14/2013 1204   BILITOT 1.1 04/01/2021 0818   BILITOT 1.0 01/07/2016 1322   BILITOT 1.0 02/14/2013 1204   PROT 6.1 (L) 04/01/2021 0818   PROT 5.2 (L) 01/07/2016 1322   PROT 7.1 02/14/2013 1204   ALBUMIN 3.0 (L) 04/01/2021 0818   ALBUMIN 2.8 (L) 01/07/2016 1322   ALBUMIN 3.5 02/14/2013 1204     RADIOGRAPHIC STUDIES: No results found.  PERFORMANCE STATUS (ECOG) : 2 - Symptomatic, <50% confined to bed  Review of Systems Unless otherwise noted, a complete review of systems is negative.  Physical Exam General: NAD HEENT: Edentulous, no thrush or ulcerations Pulmonary: Unlabored Extremities: no edema, no joint deformities Skin: no rashes Neurological: Weakness but otherwise nonfocal  IMPRESSION: Follow-up visit.  Patient seen while he is receiving IV fluids.  He reports that he is doing okay without significant changes or concerns.  He does endorse "swimmy headedness" worse in the morning but this is chronic for him.  However, he does endorse a fall yesterday where he lost balance.  He denies sustaining any injuries.  He normally uses a walker to ambulate and has good support at home from his sister.  We will send referral to [facility] [address] for rehab screening.   Patient says that he thinks he and his sister talked about the MOST form but cannot remember what they decided.  He agreed to have more conversation regarding his goals/end-of-life decision making with family.  Patient reports stable pain on oxycodone.  He is unsure if he needs a refill but will call and let us know.  PLAN: -Continue current scope of treatment -ACP documents on file -Patient previously sent home with a MOST form to review with family -Daily bowel regimen as needed -Referral for rehab screening -Follow-up telephone visit 1 month   Patient expressed understanding and was in agreement with this plan. He also understands that He can call the clinic at any time with any questions, concerns, or complaints.     Time Total: 15 minutes  Visit consisted of counseling and education dealing with the complex and emotionally intense issues of symptom management and palliative care in the setting of serious and potentially life-threatening illness.Greater than 50%  of this time was spent counseling and  coordinating care related to the above assessment and plan.  Signed by: Altha Harm, PhD, NP-C

## 2021-04-12 NOTE — Progress Notes (Signed)
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 Mike Gip in the past.  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  Tumor was p16 positive.  He received concurrent chemoradiation with 3 cycles of cisplatin.  He has had a gradually progressive infiltrative malignancy since then PD-L1 was checked on his original biopsy specimen with a CPS score of 1.  No reportable actionable mutations.  Carbo 5-FU Keytruda regimen was discussed by Dr. Mike Gip back in September 2021 but patient ultimately did notFollow-up.  More recently patient was noted to have more pain in his neck and underwent a repeat PET scan which shows extensive progression of infiltrative and nodular tumor involving the left neck along with internal jugular adenopathy.  Invasion of spinal canal at C1-C2 level with extensive muscle involvement and abutment of skull base.  Fracture of the left transverse process of C1.  Borderline low-grade activity noted into lung nodule 7 mm in size but otherwise no evidence of distant metastatic disease  He is here to discuss systemic options   He has declined radiation treatment given concern for toxicity but is agreeable to proceed with chemotherapy  Interval history- Patient is 66 year old male who returns to clinic after cycle 2 of chemotherapy. He feels well. Has chronic dizziness post stroke. Feels well. Eating and drinking well at home.   ECOG PS- 2 Pain scale- 0 Opioid associated constipation-  no  Review of systems- Review of Systems  Constitutional:  Positive for malaise/fatigue. Negative for chills, fever and weight loss.  HENT:  Negative for congestion, ear discharge and nosebleeds.   Eyes:  Negative for blurred vision.  Respiratory:  Negative for cough, hemoptysis, sputum production, shortness of breath and wheezing.   Cardiovascular:  Negative for chest pain, palpitations, orthopnea and claudication.  Gastrointestinal:  Negative for abdominal pain, blood in stool, constipation, diarrhea, heartburn, melena, nausea and vomiting.  Genitourinary:  Negative for dysuria, flank pain, frequency, hematuria and urgency.  Musculoskeletal:  Negative for back pain, joint pain and myalgias.  Skin:  Negative for rash.  Neurological:  Negative for dizziness, tingling, focal weakness, seizures, weakness and headaches.  Endo/Heme/Allergies:  Does not bruise/bleed easily.  Psychiatric/Behavioral:  Negative for depression and suicidal ideas. The patient does not have insomnia.     No Known Allergies  Past Medical History:  Diagnosis Date   Arthritis    Diabetes mellitus without complication ([place])    Hyperlipidemia    Hypertension    Obesity    Osteomyelitis ([place])    From pt chart   Renal insufficiency    CKD noted on chart   Staph infection 05/29/2016   Nov 2017. 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   Tongue cancer ([place])    Stage IV squamous cell carcinoma of the base of the tongue/left tonsil     Past Surgical History:  Procedure Laterality Date   EXCISION OF TONGUE  LESION Left 06/19/2016   Procedure: EXCISION OF TONGUE LESION;  Surgeon: Margaretha Sheffield, MD;  Location: [place];  Service: ENT;  Laterality: Left;  diabetic - insulin and oral meds - not using either currently   gastric tumor removed  2010   I & D EXTREMITY Left 01/09/2016   Procedure: IRRIGATION AND  DEBRIDEMENT EXTREMITY;  Surgeon: Robert Bellow, MD;  Location: [facility];  Service: General;  Laterality: Left;   LARYNGOSCOPY N/A 06/19/2016   Procedure: LARYNGOSCOPY;  Surgeon: Margaretha Sheffield, MD;  Location: [place];  Service: ENT;  Laterality: N/A;   PERIPHERAL VASCULAR CATHETERIZATION Right 12/31/2015   Procedure: Lower Extremity Angiography;  Surgeon: Algernon Huxley, MD;  Location: [place] [facility];  Service: Cardiovascular;  Laterality: Right;   PORTA CATH INSERTION N/A 03/07/2021   Procedure: PORTA CATH INSERTION;  Surgeon: Algernon Huxley, MD;  Location: [place] [facility];  Service: Cardiovascular;  Laterality: N/A;   tongue mass biopsy      Social History   Socioeconomic History   Marital status: Married    Spouse name: Not on file   Number of children: Not on file   Years of education: Not on file   Highest education level: Not on file  Occupational History   Not on file  Tobacco Use   Smoking status: Never   Smokeless tobacco: Never  Vaping Use   Vaping Use: Never used  Substance and Sexual Activity   Alcohol use: No    Alcohol/week: 0.0 standard drinks   Drug use: No   Sexual activity: Not on file  Other Topics Concern   Not on file  Social History Narrative   Lives at home with wife. Ambulates independently.   Social Determinants of Health   Financial Resource Strain: Not on file  Food Insecurity: Not on file  Transportation Needs: Not on file  Physical Activity: Not on file  Stress: Not on file  Social Connections: Not on file  Intimate Partner Violence: Not on file    Family History  Problem Relation Age of Onset   Heart disease Mother    Hypertension Father    Cancer Sister        stomach   Heart disease Brother        MI   Cancer Daughter    Cancer Son        testicular   ADD / ADHD Sister      Current Outpatient Medications:    amLODipine (NORVASC) 10 MG tablet, Take 10 mg by mouth daily., Disp: , Rfl:    aspirin EC 81 MG EC  tablet, Take 1 tablet (81 mg total) by mouth daily., Disp:  , Rfl:    atorvastatin (LIPITOR) 40 MG tablet, Take 1 tablet (40 mg total) by mouth daily., Disp:  , Rfl:    calcium-vitamin D (OSCAL WITH D) 500-5 MG-MCG tablet, Take 1 tablet by mouth daily with breakfast., Disp: 30 tablet, Rfl: 0   dexamethasone (DECADRON) 4 MG tablet, Take 2 tablets (8 mg total) by mouth daily. Start the day after carboplatin chemotherapy for 3 days., Disp: 30 tablet, Rfl: 1   LANTUS SOLOSTAR 100 UNIT/ML Solostar Pen, Inject 18 Units into the skin at bedtime., Disp: , Rfl:    levothyroxine (SYNTHROID) 25 MCG tablet, Take 25 mcg by mouth daily., Disp: , Rfl:    lidocaine-prilocaine (EMLA) cream, Apply to affected area once, Disp: 30 g, Rfl: 3   LORazepam (ATIVAN) 0.5 MG tablet,  Take 1 tablet (0.5 mg total) by mouth every 6 (six) hours as needed (Nausea or vomiting)., Disp: 30 tablet, Rfl: 0   losartan (COZAAR) 50 MG tablet, Take 50 mg by mouth daily., Disp: , Rfl:    magic mouthwash w/lidocaine SOLN, Take 5 mLs by mouth 4 (four) times daily as needed for mouth pain., Disp: 120 mL, Rfl: 1   ondansetron (ZOFRAN) 8 MG tablet, Take 1 tablet (8 mg total) by mouth 2 (two) times daily as needed for refractory nausea / vomiting. Start on day 3 after carboplatin chemo., Disp: 30 tablet, Rfl: 1   oxyCODONE (OXY IR/ROXICODONE) 5 MG immediate release tablet, Take 1 tablet (5 mg total) by mouth every 6 (six) hours as needed for severe pain., Disp: 60 tablet, Rfl: 0   polyethylene glycol (MIRALAX / GLYCOLAX) 17 g packet, Take 17 g by mouth 2 (two) times daily. (Patient taking differently: Take 17 g by mouth daily as needed.), Disp: 14 each, Rfl: 0   potassium chloride 20 MEQ/15ML (10%) SOLN, TAKE 15 MLS BY MOUTH DAILY., Disp: 210 mL, Rfl: 0   prochlorperazine (COMPAZINE) 10 MG tablet, Take 1 tablet (10 mg total) by mouth every 6 (six) hours as needed (Nausea or vomiting). (Patient not taking: Reported on 03/11/2021), Disp: 30 tablet, Rfl:  1 No current facility-administered medications for this visit.  Facility-Administered Medications Ordered in Other Visits:    0.9 %  sodium chloride infusion, , Intravenous, Continuous, Verlon Au, NP   heparin lock flush 100 unit/mL, 500 Units, Intravenous, Once, Verlon Au, NP   sodium chloride flush (NS) 0.9 % injection 10 mL, 10 mL, Intravenous, Once, Verlon Au, NP  Physical exam:  Vitals:   04/12/21 1320  BP: 115/74  Pulse: 91  Resp: 16  Temp: 97.9 F (36.6 C)  TempSrc: Tympanic  SpO2: 100%  Weight: 151 lb (68.5 kg)   Physical Exam Vitals reviewed.  Constitutional:      Appearance: He is not ill-appearing.  HENT:     Mouth/Throat:     Comments: edentulous Pulmonary:     Effort: No respiratory distress.  Skin:    Coloration: Skin is not pale.  Neurological:     Mental Status: He is alert. Mental status is at baseline.     Comments: Hx of stroke  Psychiatric:        Mood and Affect: Mood normal.        Behavior: Behavior normal.     CMP Latest Ref Rng & Units 04/01/2021  Glucose 70 - 99 mg/dL 108(H)  BUN 8 - 23 mg/dL 12  Creatinine 0.61 - 1.24 mg/dL 0.69  Sodium 135 - 145 mmol/L 127(L)  Potassium 3.5 - 5.1 mmol/L 3.9  Chloride 98 - 111 mmol/L 92(L)  CO2 22 - 32 mmol/L 28  Calcium 8.9 - 10.3 mg/dL 8.2(L)  Total Protein 6.5 - 8.1 g/dL 6.1(L)  Total Bilirubin 0.3 - 1.2 mg/dL 1.1  Alkaline Phos 38 - 126 U/L 101  AST 15 - 41 U/L 17  ALT 0 - 44 U/L 23   CBC Latest Ref Rng & Units 04/12/2021  WBC 4.0 - 10.5 K/uL 2.2(L)  Hemoglobin 13.0 - 17.0 g/dL 9.2(L)  Hematocrit 39.0 - 52.0 % 27.6(L)  Platelets 150 - 400 K/uL 178    No images are attached to the encounter.  No results found.   Assessment and plan- Patient is a 66 y.o. 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 Currently, s/p cycle 2 carbo-taxol Keytruda chemotherapy. Udenyca held due to leukocytosis.   Tolerating  well. IV fluids today.   Chemotherapy induced anemia- Hemoglobin 9.2. Monitor.   Chemotherapy induced neutropenia- ANC 1.2. Monitor.   Neoplasm related pain: Patient will continue gabapentin as well as as needed oxycodone.  Abnormal thyroid function tests: Free T4 is normal.  TSH mildly elevated.  Continue to monitor.    Visit Diagnosis 1. Carcinoma of base of tongue ([place])   2. Antineoplastic chemotherapy induced anemia   3. Chemotherapy induced neutropenia ([place])    Beckey Rutter, [address], AGNP-C [place] at [facility] [phone] (clinic) 04/12/2021

## 2021-04-15 ENCOUNTER — Ambulatory Visit: Payer: Medicare Other

## 2021-04-16 ENCOUNTER — Telehealth: Payer: Self-pay | Admitting: *Deleted

## 2021-04-16 ENCOUNTER — Ambulatory Visit: Payer: Medicare Other

## 2021-04-16 NOTE — Telephone Encounter (Signed)
Attempted to reach patient to schedule OT screen apt with Kent [name] (ref by Merrily Pew). Patient's phone rings, "your call can not be completed at this time." Pt has an apt on Monday 2/20 with the clinical team. If I can not reach patient by this apt, we can certainly schedule the apts with Kent [name] on this day.

## 2021-04-17 ENCOUNTER — Ambulatory Visit: Payer: Medicare Other

## 2021-04-17 NOTE — Telephone Encounter (Signed)
Attempted to reach patient for the 2nd time. Phone line stated, "Im sorry, your call can not be completed at this time."

## 2021-04-18 ENCOUNTER — Ambulatory Visit: Payer: Medicare Other

## 2021-04-19 ENCOUNTER — Ambulatory Visit: Payer: Medicare Other

## 2021-04-22 ENCOUNTER — Inpatient Hospital Stay (HOSPITAL_BASED_OUTPATIENT_CLINIC_OR_DEPARTMENT_OTHER): Payer: Medicare Other | Admitting: Oncology

## 2021-04-22 ENCOUNTER — Other Ambulatory Visit: Payer: Self-pay

## 2021-04-22 ENCOUNTER — Encounter: Payer: Self-pay | Admitting: Oncology

## 2021-04-22 ENCOUNTER — Ambulatory Visit: Payer: Medicare Other

## 2021-04-22 ENCOUNTER — Inpatient Hospital Stay: Payer: Medicare Other

## 2021-04-22 VITALS — BP 127/93 | HR 94

## 2021-04-22 VITALS — BP 88/74 | HR 113 | Temp 97.8°F | Resp 17 | Wt 147.0 lb

## 2021-04-22 DIAGNOSIS — Z5112 Encounter for antineoplastic immunotherapy: Secondary | ICD-10-CM

## 2021-04-22 DIAGNOSIS — E861 Hypovolemia: Secondary | ICD-10-CM

## 2021-04-22 DIAGNOSIS — Z5111 Encounter for antineoplastic chemotherapy: Secondary | ICD-10-CM | POA: Diagnosis not present

## 2021-04-22 DIAGNOSIS — C01 Malignant neoplasm of base of tongue: Secondary | ICD-10-CM

## 2021-04-22 DIAGNOSIS — G893 Neoplasm related pain (acute) (chronic): Secondary | ICD-10-CM | POA: Diagnosis not present

## 2021-04-22 DIAGNOSIS — I9589 Other hypotension: Secondary | ICD-10-CM | POA: Diagnosis not present

## 2021-04-22 DIAGNOSIS — Z7189 Other specified counseling: Secondary | ICD-10-CM

## 2021-04-22 LAB — CBC WITH DIFFERENTIAL/PLATELET
Abs Immature Granulocytes: 0.08 10*3/uL — ABNORMAL HIGH (ref 0.00–0.07)
Basophils Absolute: 0.1 10*3/uL (ref 0.0–0.1)
Basophils Relative: 1 %
Eosinophils Absolute: 0.1 10*3/uL (ref 0.0–0.5)
Eosinophils Relative: 1 %
HCT: 32.5 % — ABNORMAL LOW (ref 39.0–52.0)
Hemoglobin: 10.6 g/dL — ABNORMAL LOW (ref 13.0–17.0)
Immature Granulocytes: 1 %
Lymphocytes Relative: 9 %
Lymphs Abs: 0.9 10*3/uL (ref 0.7–4.0)
MCH: 28.8 pg (ref 26.0–34.0)
MCHC: 32.6 g/dL (ref 30.0–36.0)
MCV: 88.3 fL (ref 80.0–100.0)
Monocytes Absolute: 1 10*3/uL (ref 0.1–1.0)
Monocytes Relative: 10 %
Neutro Abs: 7.8 10*3/uL — ABNORMAL HIGH (ref 1.7–7.7)
Neutrophils Relative %: 78 %
Platelets: 284 10*3/uL (ref 150–400)
RBC: 3.68 MIL/uL — ABNORMAL LOW (ref 4.22–5.81)
RDW: 15.9 % — ABNORMAL HIGH (ref 11.5–15.5)
WBC: 10 10*3/uL (ref 4.0–10.5)
nRBC: 0 % (ref 0.0–0.2)

## 2021-04-22 LAB — COMPREHENSIVE METABOLIC PANEL
ALT: 13 U/L (ref 0–44)
AST: 19 U/L (ref 15–41)
Albumin: 2.8 g/dL — ABNORMAL LOW (ref 3.5–5.0)
Alkaline Phosphatase: 108 U/L (ref 38–126)
Anion gap: 7 (ref 5–15)
BUN: 11 mg/dL (ref 8–23)
CO2: 27 mmol/L (ref 22–32)
Calcium: 8.1 mg/dL — ABNORMAL LOW (ref 8.9–10.3)
Chloride: 98 mmol/L (ref 98–111)
Creatinine, Ser: 0.85 mg/dL (ref 0.61–1.24)
GFR, Estimated: >60 mL/min (ref >60)
Glucose, Bld: 115 mg/dL — ABNORMAL HIGH (ref 70–99)
Potassium: 3.7 mmol/L (ref 3.5–5.1)
Sodium: 132 mmol/L — ABNORMAL LOW (ref 135–145)
Total Bilirubin: 0.4 mg/dL (ref 0.3–1.2)
Total Protein: 6.5 g/dL (ref 6.5–8.1)

## 2021-04-22 MED ORDER — DIPHENHYDRAMINE HCL 50 MG/ML IJ SOLN
50.0000 mg | Freq: Once | INTRAMUSCULAR | Status: AC
  Administered 2021-04-22: 50 mg via INTRAVENOUS
  Filled 2021-04-22: qty 1

## 2021-04-22 MED ORDER — SODIUM CHLORIDE 0.9 % IV SOLN
10.0000 mg | Freq: Once | INTRAVENOUS | Status: AC
  Administered 2021-04-22: 10 mg via INTRAVENOUS
  Filled 2021-04-22: qty 10

## 2021-04-22 MED ORDER — SODIUM CHLORIDE 0.9 % IV SOLN
Freq: Once | INTRAVENOUS | Status: AC
  Filled 2021-04-22: qty 250

## 2021-04-22 MED ORDER — FAMOTIDINE IN NACL 20-0.9 MG/50ML-% IV SOLN
20.0000 mg | Freq: Once | INTRAVENOUS | Status: AC
  Administered 2021-04-22: 20 mg via INTRAVENOUS
  Filled 2021-04-22: qty 50

## 2021-04-22 MED ORDER — SODIUM CHLORIDE 0.9 % IV SOLN
150.0000 mg | Freq: Once | INTRAVENOUS | Status: AC
  Administered 2021-04-22: 150 mg via INTRAVENOUS
  Filled 2021-04-22: qty 150

## 2021-04-22 MED ORDER — HEPARIN SOD (PORK) LOCK FLUSH 100 UNIT/ML IV SOLN
INTRAVENOUS | Status: AC
  Administered 2021-04-22: 500 [IU]
  Filled 2021-04-22: qty 5

## 2021-04-22 MED ORDER — SODIUM CHLORIDE 0.9 % IV SOLN
495.5000 mg | Freq: Once | INTRAVENOUS | Status: AC
  Administered 2021-04-22: 500 mg via INTRAVENOUS
  Filled 2021-04-22: qty 50

## 2021-04-22 MED ORDER — SODIUM CHLORIDE 0.9 % IV SOLN
200.0000 mg | Freq: Once | INTRAVENOUS | Status: AC
  Administered 2021-04-22: 200 mg via INTRAVENOUS
  Filled 2021-04-22: qty 8

## 2021-04-22 MED ORDER — HEPARIN SOD (PORK) LOCK FLUSH 100 UNIT/ML IV SOLN
500.0000 [IU] | Freq: Once | INTRAVENOUS | Status: AC | PRN
  Filled 2021-04-22: qty 5

## 2021-04-22 MED ORDER — SODIUM CHLORIDE 0.9 % IV SOLN
175.0000 mg/m2 | Freq: Once | INTRAVENOUS | Status: AC
  Administered 2021-04-22: 324 mg via INTRAVENOUS
  Filled 2021-04-22: qty 54

## 2021-04-22 MED ORDER — PALONOSETRON HCL INJECTION 0.25 MG/5ML
0.2500 mg | Freq: Once | INTRAVENOUS | Status: AC
  Administered 2021-04-22: 0.25 mg via INTRAVENOUS
  Filled 2021-04-22: qty 5

## 2021-04-22 NOTE — Progress Notes (Signed)
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 Mike Gip in the past.  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  Tumor was p16 positive.  He received concurrent chemoradiation with 3 cycles of cisplatin.  He has had a gradually progressive infiltrative malignancy since then PD-L1 was checked on his original biopsy specimen with a CPS score of 1.  No reportable actionable mutations.  Carbo 5-FU Keytruda regimen was discussed by Dr. Mike Gip back in September 2021 but patient ultimately did notFollow-up.  More recently patient was noted to have more pain in his neck and underwent a repeat PET scan which shows extensive progression of infiltrative and nodular tumor involving the left neck along with internal jugular adenopathy.  Invasion of spinal canal at C1-C2 level with extensive muscle involvement and abutment of skull base.  Fracture of the left transverse process of C1.  Borderline low-grade activity noted into lung nodule 7 mm in size but otherwise no evidence of distant metastatic disease  He is here to discuss systemic options   He has declined radiation treatment given concern for toxicity but is agreeable to proceed with chemotherapy  Interval history-appetite is fair.  Reports ongoing fatigue.  Neck pain is presently well controlled.  Denies any significant nausea or vomiting.  ECOG PS- 2 Pain scale- 0 Opioid associated constipation-  no  Review of systems- Review of Systems  Constitutional:  Positive for malaise/fatigue. Negative for chills, fever and weight loss.  HENT:  Negative for congestion, ear discharge and nosebleeds.   Eyes:  Negative for blurred vision.  Respiratory:  Negative for cough, hemoptysis, sputum production, shortness of breath and wheezing.   Cardiovascular:  Negative for chest pain, palpitations, orthopnea and claudication.  Gastrointestinal:  Negative for abdominal pain, blood in stool, constipation, diarrhea, heartburn, melena, nausea and vomiting.  Genitourinary:  Negative for dysuria, flank pain, frequency, hematuria and urgency.  Musculoskeletal:  Negative for back pain, joint pain and myalgias.  Skin:  Negative for rash.  Neurological:  Negative for dizziness, tingling, focal weakness, seizures, weakness and headaches.  Endo/Heme/Allergies:  Does not bruise/bleed easily.  Psychiatric/Behavioral:  Negative for depression and suicidal ideas. The patient does not have insomnia.      No Known Allergies   Past Medical History:  Diagnosis Date   Arthritis    Diabetes mellitus without complication ([place])    Hyperlipidemia    Hypertension    Obesity    Osteomyelitis ([place])    From pt chart   Renal insufficiency    CKD noted on chart   Staph infection 05/29/2016   Nov 2017. 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   Tongue cancer ([place])    Stage IV squamous cell carcinoma of the base of the tongue/left tonsil     Past Surgical History:  Procedure Laterality Date   EXCISION OF TONGUE LESION Left 06/19/2016   Procedure: EXCISION  OF TONGUE LESION;  Surgeon: Margaretha Sheffield, MD;  Location: [place];  Service: ENT;  Laterality: Left;  diabetic - insulin and oral meds - not using either currently   gastric tumor removed  2010   I & D EXTREMITY Left 01/09/2016   Procedure: IRRIGATION  AND DEBRIDEMENT EXTREMITY;  Surgeon: Robert Bellow, MD;  Location: [facility];  Service: General;  Laterality: Left;   LARYNGOSCOPY N/A 06/19/2016   Procedure: LARYNGOSCOPY;  Surgeon: Margaretha Sheffield, MD;  Location: [place];  Service: ENT;  Laterality: N/A;   PERIPHERAL VASCULAR CATHETERIZATION Right 12/31/2015   Procedure: Lower Extremity Angiography;  Surgeon: Algernon Huxley, MD;  Location: [place] [facility];  Service: Cardiovascular;  Laterality: Right;   PORTA CATH INSERTION N/A 03/07/2021   Procedure: PORTA CATH INSERTION;  Surgeon: Algernon Huxley, MD;  Location: [place] [facility];  Service: Cardiovascular;  Laterality: N/A;   tongue mass biopsy      Social History   Socioeconomic History   Marital status: Married    Spouse name: Not on file   Number of children: Not on file   Years of education: Not on file   Highest education level: Not on file  Occupational History   Not on file  Tobacco Use   Smoking status: Never   Smokeless tobacco: Never  Vaping Use   Vaping Use: Never used  Substance and Sexual Activity   Alcohol use: No    Alcohol/week: 0.0 standard drinks   Drug use: No   Sexual activity: Not on file  Other Topics Concern   Not on file  Social History Narrative   Lives at home with wife. Ambulates independently.   Social Determinants of Health   Financial Resource Strain: Not on file  Food Insecurity: Not on file  Transportation Needs: Not on file  Physical Activity: Not on file  Stress: Not on file  Social Connections: Not on file  Intimate Partner Violence: Not on file    Family History  Problem Relation Age of Onset   Heart disease Mother    Hypertension Father    Cancer Sister        stomach   Heart disease Brother        MI   Cancer Daughter    Cancer Son        testicular   ADD / ADHD Sister      Current Outpatient Medications:    amLODipine (NORVASC) 10 MG tablet, Take 10 mg by mouth daily., Disp: , Rfl:    aspirin EC 81 MG  EC tablet, Take 1 tablet (81 mg total) by mouth daily., Disp:  , Rfl:    atorvastatin (LIPITOR) 40 MG tablet, Take 1 tablet (40 mg total) by mouth daily., Disp:  , Rfl:    calcium-vitamin D (OSCAL WITH D) 500-5 MG-MCG tablet, Take 1 tablet by mouth daily with breakfast., Disp: 30 tablet, Rfl: 0   dexamethasone (DECADRON) 4 MG tablet, Take 2 tablets (8 mg total) by mouth daily. Start the day after carboplatin chemotherapy for 3 days., Disp: 30 tablet, Rfl: 1   LANTUS SOLOSTAR 100 UNIT/ML Solostar Pen, Inject 18 Units into the skin at bedtime., Disp: , Rfl:    levothyroxine (SYNTHROID) 25 MCG tablet, Take 25 mcg by mouth daily., Disp: , Rfl:    lidocaine-prilocaine (EMLA) cream, Apply to affected area once, Disp: 30 g, Rfl: 3   LORazepam (ATIVAN) 0.5 MG tablet, Take 1 tablet (0.5 mg total) by  mouth every 6 (six) hours as needed (Nausea or vomiting)., Disp: 30 tablet, Rfl: 0   losartan (COZAAR) 50 MG tablet, Take 50 mg by mouth daily., Disp: , Rfl:    magic mouthwash w/lidocaine SOLN, Take 5 mLs by mouth 4 (four) times daily as needed for mouth pain., Disp: 120 mL, Rfl: 1   ondansetron (ZOFRAN) 8 MG tablet, Take 1 tablet (8 mg total) by mouth 2 (two) times daily as needed for refractory nausea / vomiting. Start on day 3 after carboplatin chemo., Disp: 30 tablet, Rfl: 1   oxyCODONE (OXY IR/ROXICODONE) 5 MG immediate release tablet, Take 1 tablet (5 mg total) by mouth every 6 (six) hours as needed for severe pain., Disp: 60 tablet, Rfl: 0   polyethylene glycol (MIRALAX / GLYCOLAX) 17 g packet, Take 17 g by mouth 2 (two) times daily. (Patient taking differently: Take 17 g by mouth daily as needed.), Disp: 14 each, Rfl: 0   potassium chloride 20 MEQ/15ML (10%) SOLN, TAKE 15 MLS BY MOUTH DAILY., Disp: 210 mL, Rfl: 0   prochlorperazine (COMPAZINE) 10 MG tablet, Take 1 tablet (10 mg total) by mouth every 6 (six) hours as needed (Nausea or vomiting)., Disp: 30 tablet, Rfl: 1   FLONASE ALLERGY RELIEF 50 MCG/ACT  nasal spray, SMARTSIG:2 Spray(s) Both Nares Daily PRN, Disp: , Rfl:  No current facility-administered medications for this visit.  Facility-Administered Medications Ordered in Other Visits:    0.9 %  sodium chloride infusion, , Intravenous, Once, Sindy Guadeloupe, MD, Last Rate: 999 mL/hr at 04/22/21 0926, New Bag at 04/22/21 0926   CARBOplatin (PARAPLATIN) 500 mg in sodium chloride 0.9 % 250 mL chemo infusion, 500 mg, Intravenous, Once, Sindy Guadeloupe, MD   dexamethasone (DECADRON) 10 mg in sodium chloride 0.9 % 50 mL IVPB, 10 mg, Intravenous, Once, Sindy Guadeloupe, MD   diphenhydrAMINE (BENADRYL) injection 50 mg, 50 mg, Intravenous, Once, Sindy Guadeloupe, MD   famotidine (PEPCID) IVPB 20 mg premix, 20 mg, Intravenous, Once, Sindy Guadeloupe, MD   fosaprepitant (EMEND) 150 mg in sodium chloride 0.9 % 145 mL IVPB, 150 mg, Intravenous, Once, Sindy Guadeloupe, MD   heparin lock flush 100 unit/mL, 500 Units, Intracatheter, Once PRN, Sindy Guadeloupe, MD   PACLitaxel (TAXOL) 324 mg in sodium chloride 0.9 % 500 mL chemo infusion (> 58m/m2), 175 mg/m2 (Order-Specific), Intravenous, Once, RSindy Guadeloupe MD   palonosetron (ALOXI) injection 0.25 mg, 0.25 mg, Intravenous, Once, RSindy Guadeloupe MD   pembrolizumab ([facility] 200 mg in sodium chloride 0.9 % 50 mL chemo infusion, 200 mg, Intravenous, Once, RSindy Guadeloupe MD  Physical exam:  Vitals:   04/22/21 0800  BP: (!) 88/74  Pulse: (!) 113  Resp: 17  Temp: 97.8 F (36.6 C)  TempSrc: Tympanic  SpO2: 96%  Weight: 147 lb (66.7 kg)   Physical Exam Constitutional:      General: He is not in acute distress.    Comments: Sitting in a wheelchair.  Appears fatigued  HENT:     Mouth/Throat:     Mouth: Mucous membranes are moist.     Pharynx: Oropharynx is clear.  Neck:     Comments: Significant induration secondary to prior radiation Cardiovascular:     Rate and Rhythm: Regular rhythm. Tachycardia present.     Heart sounds: Normal heart sounds.   Pulmonary:     Effort: Pulmonary effort is normal.     Breath sounds: Normal breath sounds.  Abdominal:  General: Bowel sounds are normal.     Palpations: Abdomen is soft.  Skin:    General: Skin is warm and dry.  Neurological:     Mental Status: He is alert and oriented to person, place, and time.     CMP Latest Ref Rng & Units 04/22/2021  Glucose 70 - 99 mg/dL 115(H)  BUN 8 - 23 mg/dL 11  Creatinine 0.61 - 1.24 mg/dL 0.85  Sodium 135 - 145 mmol/L 132(L)  Potassium 3.5 - 5.1 mmol/L 3.7  Chloride 98 - 111 mmol/L 98  CO2 22 - 32 mmol/L 27  Calcium 8.9 - 10.3 mg/dL 8.1(L)  Total Protein 6.5 - 8.1 g/dL 6.5  Total Bilirubin 0.3 - 1.2 mg/dL 0.4  Alkaline Phos 38 - 126 U/L 108  AST 15 - 41 U/L 19  ALT 0 - 44 U/L 13   CBC Latest Ref Rng & Units 04/22/2021  WBC 4.0 - 10.5 K/uL 10.0  Hemoglobin 13.0 - 17.0 g/dL 10.6(L)  Hematocrit 39.0 - 52.0 % 32.5(L)  Platelets 150 - 400 K/uL 284     Assessment and plan- Patient is a 66 y.o. 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  He is here for on treatment assessment prior to cycle 3 of CarboTaxol Keytruda chemotherapy  Counts okay to proceed with cycle 3 of CarboTaxol Keytruda chemotherapy today.  I will hold off on giving him your any count today.  1 L of IV fluids today prior to proceeding with chemotherapy.  BMP again in 10 days time and possible IV fluids.  Repeat CT chest abdomen and pelvis with contrast in about 2 to 3 weeks time and I will see him back in 4 weeks for cycle 4 of CarboTaxol Keytruda chemotherapy.  Plan is to complete 6 cycles and if he continues to have good response we will proceed with Keytruda alone until progression or toxicity.  Neoplasm related pain: Continue gabapentin and as needed oxycodone.   Visit Diagnosis 1. Carcinoma of base of tongue ([place])   2. Encounter for antineoplastic immunotherapy   3. Hypotension due to hypovolemia   4.  Encounter for antineoplastic chemotherapy   5. Neoplasm related pain      Dr. Randa Evens, MD, MPH [facility] at [facility] [phone] 04/22/2021 9:55 AM

## 2021-04-22 NOTE — Progress Notes (Signed)
HR 113, MD aware, giving fluids

## 2021-04-22 NOTE — Progress Notes (Signed)
Patient here for oncology follow-up appointment, concerns of dizziness

## 2021-04-23 ENCOUNTER — Other Ambulatory Visit: Payer: Self-pay | Admitting: *Deleted

## 2021-04-23 NOTE — Progress Notes (Signed)
error 

## 2021-04-23 NOTE — Telephone Encounter (Signed)
I spoke with the patient's sister, Butch Penny and discussed the OT screen apt referral. Sister stated that she is currently not able to bring her brother to any additional apt. Pt's cousin is currently transporting the patient to the apts. Sister Butch Penny has been dx with copd and she is not able to accompany the patient to his apt for this personal medical reason.  Pt's Sister will discuss referral with pt; however, at this time, she would like to hold of scheduling the OT screening. Pt lives at home with his sister. Pt has not had any new falls. He is ambulating at home with a walker and he was able to go walking outside by himself today.  She understands that this OT screening service is available if pt changes his mind. She thanked me for calling her.

## 2021-04-24 ENCOUNTER — Other Ambulatory Visit: Payer: Self-pay | Admitting: *Deleted

## 2021-04-24 MED ORDER — OXYCODONE HCL 5 MG PO TABS: 5.0000 mg | ORAL_TABLET | Freq: Four times a day (QID) | ORAL | 0 refills | Status: DC | PRN

## 2021-04-27 ENCOUNTER — Other Ambulatory Visit: Payer: Self-pay | Admitting: Oncology

## 2021-04-29 ENCOUNTER — Encounter: Payer: Self-pay | Admitting: Hematology and Oncology

## 2021-05-02 ENCOUNTER — Inpatient Hospital Stay: Payer: Medicare Other

## 2021-05-02 ENCOUNTER — Inpatient Hospital Stay: Payer: Medicare Other | Attending: Oncology

## 2021-05-02 ENCOUNTER — Other Ambulatory Visit: Payer: Self-pay

## 2021-05-02 VITALS — BP 105/78 | HR 100 | Temp 99.0°F | Resp 18

## 2021-05-02 DIAGNOSIS — N189 Chronic kidney disease, unspecified: Secondary | ICD-10-CM | POA: Diagnosis not present

## 2021-05-02 DIAGNOSIS — M129 Arthropathy, unspecified: Secondary | ICD-10-CM | POA: Insufficient documentation

## 2021-05-02 DIAGNOSIS — C01 Malignant neoplasm of base of tongue: Secondary | ICD-10-CM

## 2021-05-02 DIAGNOSIS — Z7189 Other specified counseling: Secondary | ICD-10-CM

## 2021-05-02 DIAGNOSIS — E119 Type 2 diabetes mellitus without complications: Secondary | ICD-10-CM | POA: Insufficient documentation

## 2021-05-02 DIAGNOSIS — Z8673 Personal history of transient ischemic attack (TIA), and cerebral infarction without residual deficits: Secondary | ICD-10-CM | POA: Insufficient documentation

## 2021-05-02 DIAGNOSIS — E669 Obesity, unspecified: Secondary | ICD-10-CM | POA: Diagnosis not present

## 2021-05-02 DIAGNOSIS — Z9221 Personal history of antineoplastic chemotherapy: Secondary | ICD-10-CM | POA: Insufficient documentation

## 2021-05-02 DIAGNOSIS — Z85028 Personal history of other malignant neoplasm of stomach: Secondary | ICD-10-CM | POA: Diagnosis not present

## 2021-05-02 DIAGNOSIS — E860 Dehydration: Secondary | ICD-10-CM

## 2021-05-02 DIAGNOSIS — I509 Heart failure, unspecified: Secondary | ICD-10-CM | POA: Insufficient documentation

## 2021-05-02 DIAGNOSIS — Z79899 Other long term (current) drug therapy: Secondary | ICD-10-CM | POA: Diagnosis not present

## 2021-05-02 DIAGNOSIS — I10 Essential (primary) hypertension: Secondary | ICD-10-CM | POA: Diagnosis not present

## 2021-05-02 DIAGNOSIS — M869 Osteomyelitis, unspecified: Secondary | ICD-10-CM | POA: Diagnosis not present

## 2021-05-02 DIAGNOSIS — E876 Hypokalemia: Secondary | ICD-10-CM

## 2021-05-02 DIAGNOSIS — E785 Hyperlipidemia, unspecified: Secondary | ICD-10-CM | POA: Insufficient documentation

## 2021-05-02 LAB — BASIC METABOLIC PANEL
Anion gap: 9 (ref 5–15)
BUN: 13 mg/dL (ref 8–23)
CO2: 27 mmol/L (ref 22–32)
Calcium: 7.9 mg/dL — ABNORMAL LOW (ref 8.9–10.3)
Chloride: 93 mmol/L — ABNORMAL LOW (ref 98–111)
Creatinine, Ser: 0.69 mg/dL (ref 0.61–1.24)
GFR, Estimated: >60 mL/min (ref >60)
Glucose, Bld: 105 mg/dL — ABNORMAL HIGH (ref 70–99)
Potassium: 3.4 mmol/L — ABNORMAL LOW (ref 3.5–5.1)
Sodium: 129 mmol/L — ABNORMAL LOW (ref 135–145)

## 2021-05-02 MED ORDER — SODIUM CHLORIDE 0.9% FLUSH
10.0000 mL | Freq: Once | INTRAVENOUS | Status: AC
  Administered 2021-05-02: 10 mL via INTRAVENOUS
  Filled 2021-05-02: qty 10

## 2021-05-02 MED ORDER — HEPARIN SOD (PORK) LOCK FLUSH 100 UNIT/ML IV SOLN
500.0000 [IU] | Freq: Once | INTRAVENOUS | Status: AC
  Administered 2021-05-02: 500 [IU] via INTRAVENOUS
  Filled 2021-05-02: qty 5

## 2021-05-02 MED ORDER — SODIUM CHLORIDE 0.9 % IV SOLN
Freq: Once | INTRAVENOUS | Status: AC
  Filled 2021-05-02: qty 250

## 2021-05-02 MED ORDER — POTASSIUM CHLORIDE 20 MEQ/100ML IV SOLN
20.0000 meq | Freq: Once | INTRAVENOUS | Status: AC
  Administered 2021-05-02: 20 meq via INTRAVENOUS

## 2021-05-02 NOTE — Patient Instructions (Signed)
Potassium Chloride Injection ?What is this medication? ?POTASSIUM CHLORIDE (poe TASS i um KLOOR ide) prevents and treats low levels of potassium in your body. Potassium plays an important role in maintaining the health of your kidneys, heart, muscles, and nervous system. ?This medicine may be used for other purposes; ask your health care provider or pharmacist if you have questions. ?COMMON BRAND NAME(S): PROAMP ?What should I tell my care team before I take this medication? ?They need to know if you have any of these conditions: ?Addison disease ?Dehydration ?Diabetes (high blood sugar) ?Heart disease ?High levels of potassium in the blood ?Irregular heartbeat or rhythm ?Kidney disease ?Large areas of burned skin ?An unusual or allergic reaction to potassium, other medications, foods, dyes, or preservatives ?Pregnant or trying to get pregnant ?Breast-feeding ?How should I use this medication? ?This medication is injected into a vein. It is given in a hospital or clinic setting. ?Talk to your care team about the use of this medication in children. Special care may be needed. ?Overdosage: If you think you have taken too much of this medicine contact a poison control center or emergency room at once. ?NOTE: This medicine is only for you. Do not share this medicine with others. ?What if I miss a dose? ?This does not apply. This medication is not for regular use. ?What may interact with this medication? ?Do not take this medication with any of the following: ?Certain diuretics such as spironolactone, triamterene ?Eplerenone ?Sodium polystyrene sulfonate ?This medication may also interact with the following: ?Certain medications for blood pressure or heart disease like lisinopril, losartan, quinapril, valsartan ?Medications that lower your chance of fighting infection such as cyclosporine, tacrolimus ?NSAIDs, medications for pain and inflammation, like ibuprofen or naproxen ?Other potassium supplements ?Salt  substitutes ?This list may not describe all possible interactions. Give your health care provider a list of all the medicines, herbs, non-prescription drugs, or dietary supplements you use. Also tell them if you smoke, drink alcohol, or use illegal drugs. Some items may interact with your medicine. ?What should I watch for while using this medication? ?Visit your care team for regular checks on your progress. Tell your care team if your symptoms do not start to get better or if they get worse. ?You may need blood work while you are taking this medication. ?Avoid salt substitutes unless you are told otherwise by your care team. ?What side effects may I notice from receiving this medication? ?Side effects that you should report to your care team as soon as possible: ?Allergic reactions--skin rash, itching, hives, swelling of the face, lips, tongue, or throat ?High potassium level--muscle weakness, fast or irregular heartbeat ?Side effects that usually do not require medical attention (report to your care team if they continue or are bothersome): ?Diarrhea ?Nausea ?Stomach pain ?Vomiting ?This list may not describe all possible side effects. Call your doctor for medical advice about side effects. You may report side effects to FDA at 1-800-FDA-1088. ?Where should I keep my medication? ?This medication is given in a hospital or clinic. It will not be stored at home. ?NOTE: This sheet is a summary. It may not cover all possible information. If you have questions about this medicine, talk to your doctor, pharmacist, or health care provider. ?? 2022 [employer]/Gold Standard (2020-06-05 00:00:00) ? ?

## 2021-05-02 NOTE — Progress Notes (Signed)
Patient received IV hydration with the addition of 20 mEq Potassium. Pt ate and drank well while receiving fluids. Denies any pain. Denies Nausea, diarrhea. Discharged to home. Accompanied by his cousin. ?

## 2021-05-03 ENCOUNTER — Ambulatory Visit
Admission: RE | Admit: 2021-05-03 | Discharge: 2021-05-03 | Disposition: A | Discharge status: Home / Self Care | Payer: Medicare Other | Source: Ambulatory Visit | Attending: Oncology | Admitting: Oncology

## 2021-05-03 ENCOUNTER — Other Ambulatory Visit: Payer: Self-pay

## 2021-05-03 DIAGNOSIS — I700 Atherosclerosis of aorta: Secondary | ICD-10-CM | POA: Insufficient documentation

## 2021-05-03 DIAGNOSIS — J984 Other disorders of lung: Secondary | ICD-10-CM | POA: Diagnosis not present

## 2021-05-03 DIAGNOSIS — R599 Enlarged lymph nodes, unspecified: Secondary | ICD-10-CM | POA: Diagnosis not present

## 2021-05-03 DIAGNOSIS — C01 Malignant neoplasm of base of tongue: Secondary | ICD-10-CM | POA: Insufficient documentation

## 2021-05-03 DIAGNOSIS — Z5181 Encounter for therapeutic drug level monitoring: Secondary | ICD-10-CM | POA: Insufficient documentation

## 2021-05-03 DIAGNOSIS — I2510 Atherosclerotic heart disease of native coronary artery without angina pectoris: Secondary | ICD-10-CM | POA: Insufficient documentation

## 2021-05-03 DIAGNOSIS — Z5112 Encounter for antineoplastic immunotherapy: Secondary | ICD-10-CM | POA: Insufficient documentation

## 2021-05-03 DIAGNOSIS — Z79899 Other long term (current) drug therapy: Secondary | ICD-10-CM | POA: Diagnosis not present

## 2021-05-03 DIAGNOSIS — E861 Hypovolemia: Secondary | ICD-10-CM | POA: Insufficient documentation

## 2021-05-03 DIAGNOSIS — I9589 Other hypotension: Secondary | ICD-10-CM | POA: Insufficient documentation

## 2021-05-03 IMAGING — CT CT CHEST-ABD-PELV W/ CM
2 of 5 series · 11 of 36 positions shown, 12 images · IV contrast (agent unspecified)
Comparison: PET-CT, [DATE]

CLINICAL DATA: Carcinoma of the tongue base, ongoing chemotherapy

EXAM:
CT CHEST, ABDOMEN, AND PELVIS WITH CONTRAST
TECHNIQUE: Multidetector CT imaging of the chest, abdomen and pelvis was
performed following the standard protocol during bolus
administration of intravenous contrast.

[Series 5: cap with 5.00 ax · axial · 0.61mm/px · z∈[-1234,-674]mm · 8 of 140 slices shown, 9 images]
[im 14/140  mediastinal]
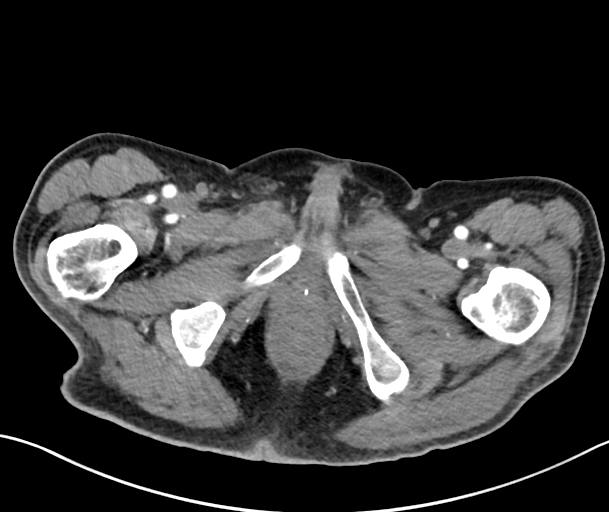
[im 14/140  bone]
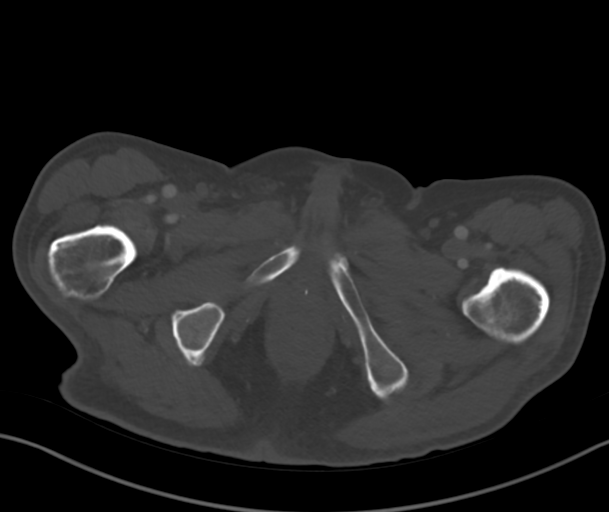
[im 28/140  mediastinal]
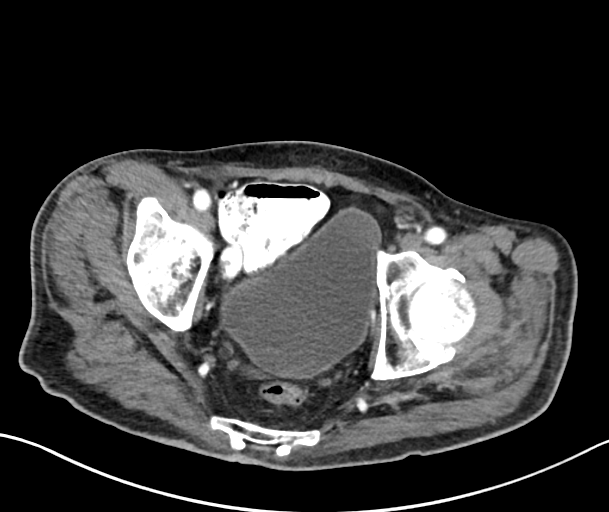
[im 42/140  mediastinal]
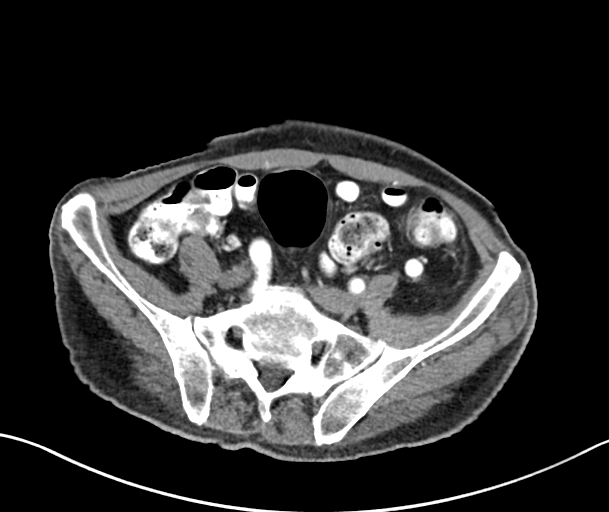
[im 56/140  mediastinal]
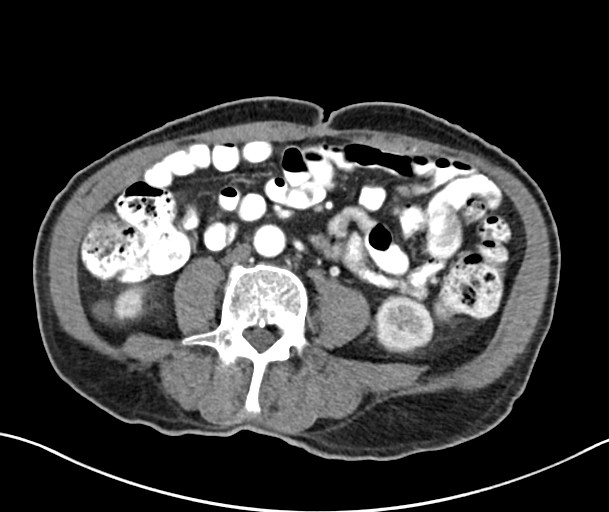
[im 84/140  mediastinal]
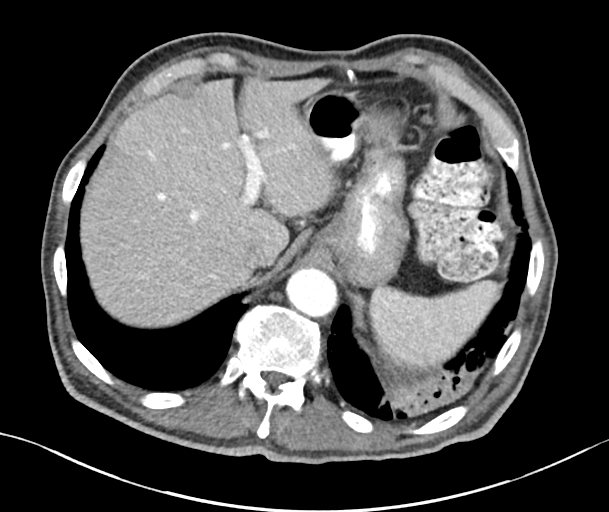
[im 98/140  mediastinal]
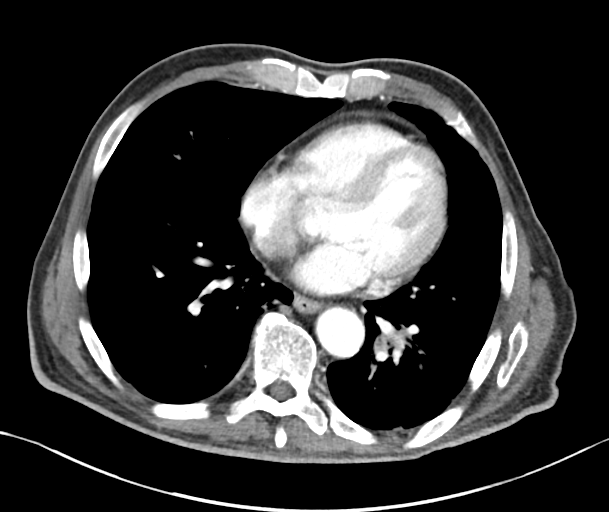
[im 112/140  mediastinal]
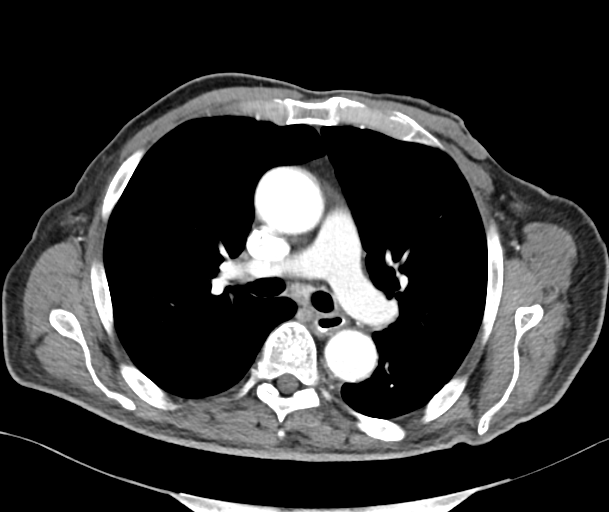
[im 126/140  mediastinal]
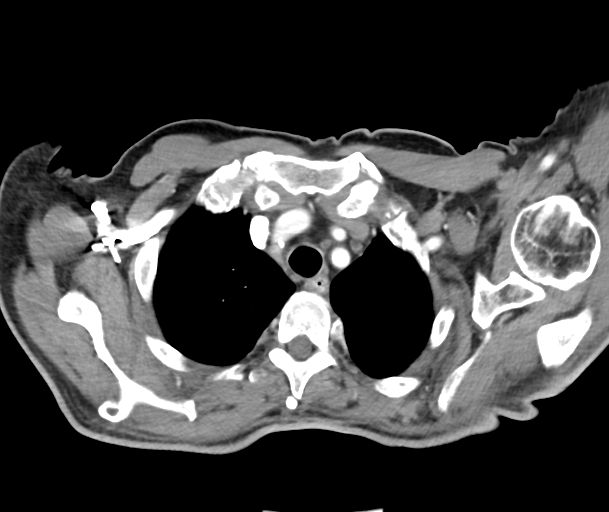

[Series 9: coronal cap with 2.00 cor · coronal · 0.73mm/px · 3 of 138 slices shown]
[im 28/138  mediastinal]
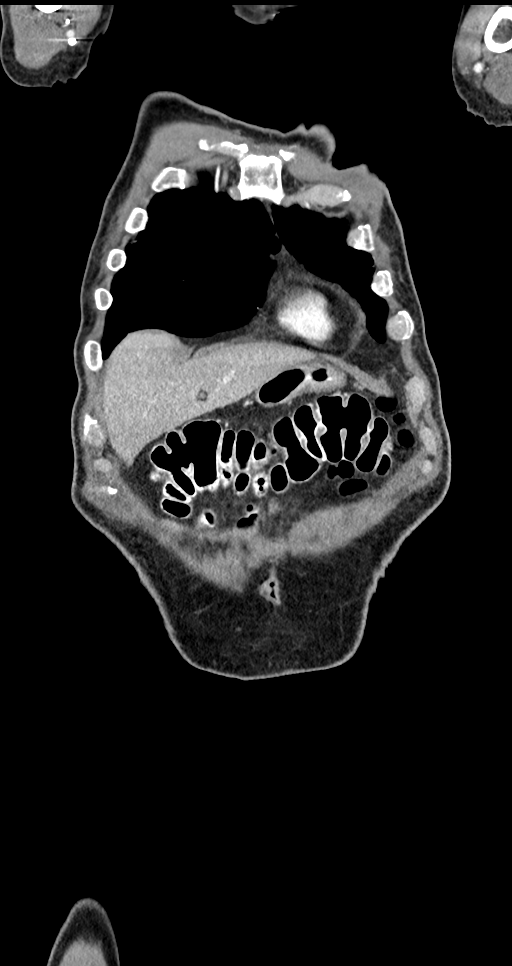
[im 55/138  mediastinal]
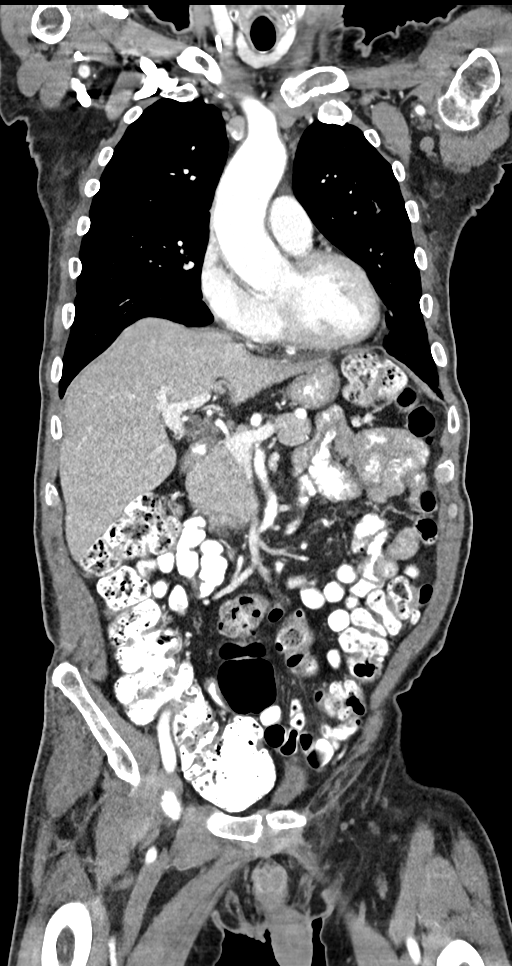
[im 83/138  mediastinal]
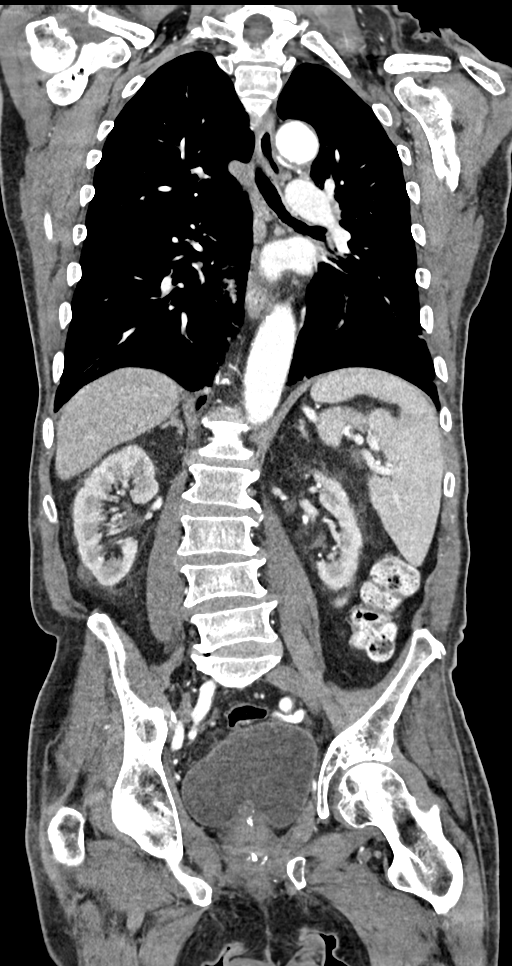

[11 of 36 positions shown; findings below may reference images not displayed]

RADIATION DOSE REDUCTION: This exam was performed according to the
departmental dose-optimization program which includes automated
exposure control, adjustment of the mA and/or kV according to
patient size and/or use of iterative reconstruction technique.

CONTRAST:  100mL OMNIPAQUE IOHEXOL 300 MG/ML SOLN additional oral
enteric contrast
FINDINGS: CT CHEST FINDINGS

Cardiovascular: Right chest port catheter. Aortic atherosclerosis.
Normal heart size. Three-vessel coronary artery calcifications. No
pericardial effusion.

Mediastinum/Nodes: Rim enhancing, necrotic appearing lymph nodes in
the anterior and posterior lower left neck, measuring up to 2.7 x
1.9 cm (series 5, image 1). No enlarged mediastinal, hilar, or
axillary lymph nodes. Thyroid gland, trachea, and esophagus
demonstrate no significant findings.

Lungs/Pleura: Significant interval increase in extensive
heterogeneous, ground-glass and consolidative airspace disease
throughout the lungs and dense consolidation at the left lung base,
with some associated tubular and varicoid bronchiectasis. There are
no discrete nodules at this time. No pleural effusion or
pneumothorax.

Musculoskeletal: No chest wall mass or suspicious osseous lesions
identified.

CT ABDOMEN PELVIS FINDINGS

Hepatobiliary: No solid liver abnormality is seen. No gallstones,
gallbladder wall thickening, or biliary dilatation.

Pancreas: Unremarkable. No pancreatic ductal dilatation or
surrounding inflammatory changes.

Spleen: Normal in size without significant abnormality.

Adrenals/Urinary Tract: Adrenal glands are unremarkable. Kidneys are
normal, without renal calculi, solid lesion, or hydronephrosis.
Bladder is unremarkable.

Stomach/Bowel: Stomach is within normal limits. Appendix appears
normal. No evidence of bowel wall thickening, distention, or
inflammatory changes. Sigmoid diverticula.

Vascular/Lymphatic: Aortic atherosclerosis. No enlarged abdominal or
pelvic lymph nodes.

Reproductive: Mild prostatomegaly with median lobe hypertrophy.

Other: No abdominal wall hernia or abnormality. No ascites.

Musculoskeletal: No acute osseous findings.
IMPRESSION: 1. Rim enhancing, necrotic appearing lymph nodes in the anterior and
posterior lower left neck, consistent with nodal metastatic disease,
and better assessed by simultaneous CT examination of the neck,
separately reported.
2. No other evidence of metastatic disease within the chest,
abdomen, or pelvis.
3. Significant interval increase in extensive heterogeneous,
ground-glass and consolidative airspace disease throughout the lungs
and dense consolidation at the left lung base. Findings are
consistent with multifocal infection and/or aspiration. There are no
discrete nodules at this time.
4. Coronary artery disease.

Aortic Atherosclerosis ([09]-[09]).

## 2021-05-03 IMAGING — CT CT NECK W/ CM
3 of 5 series · 12 of 33 positions shown, 14 images · IV contrast (agent unspecified)
Comparison: [DATE] head CT.  [DATE] neck CT

CLINICAL DATA: History of tongue cancer

EXAM:
CT NECK WITH CONTRAST
TECHNIQUE: Multidetector CT imaging of the neck was performed using the
standard protocol following the bolus administration of intravenous
contrast.

[Series 5: coronals neck 2.00 cor · coronal · 0.62mm/px · 3 of 118 slices shown]
[im 39/118  bone]
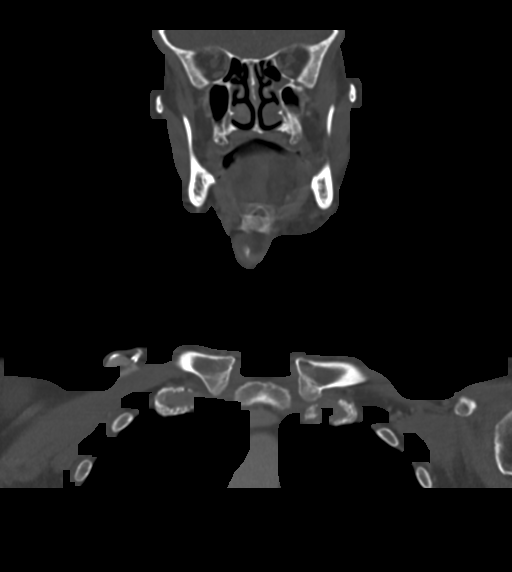
[im 52/118  bone]
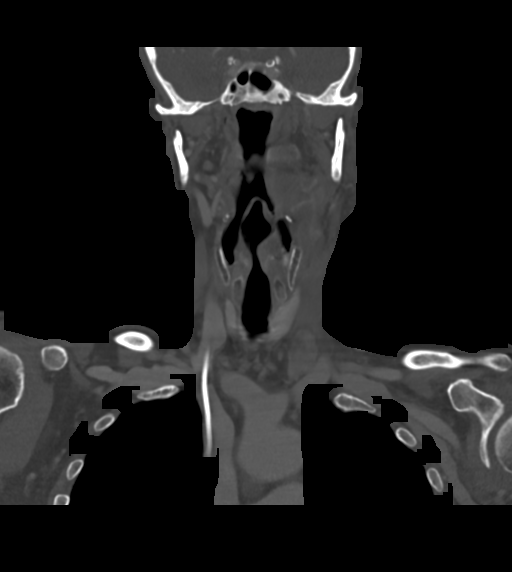
[im 66/118  bone]
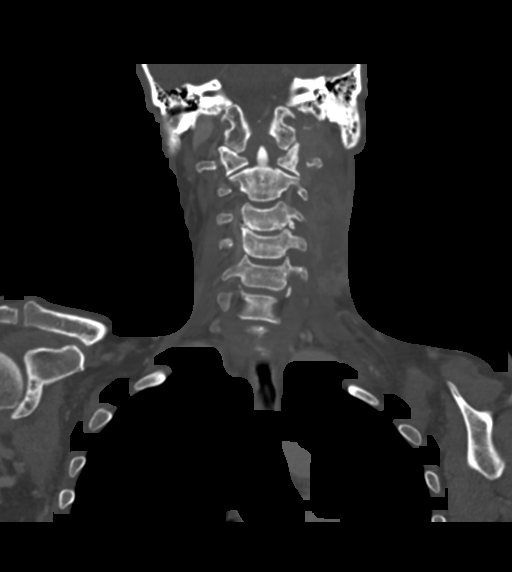

[Series 7: sagittals neck 2.00 sag · sagittal · 0.46mm/px · 5 of 159 slices shown, 6 images]
[im 53/159  bone]
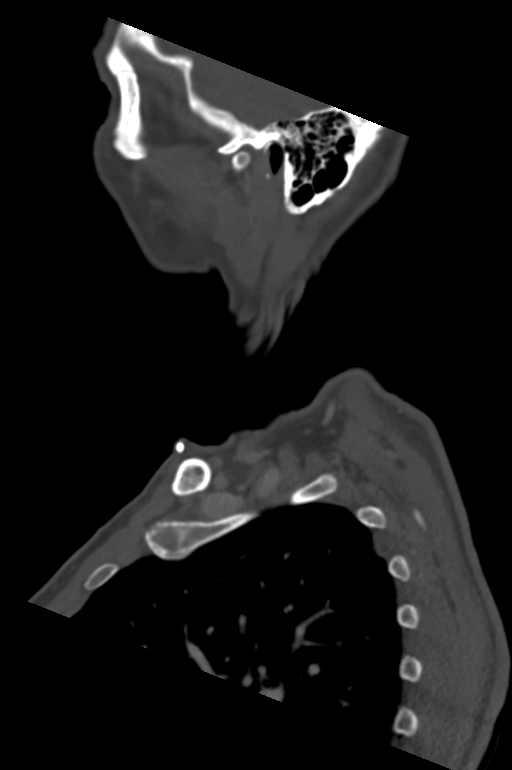
[im 66/159  bone]
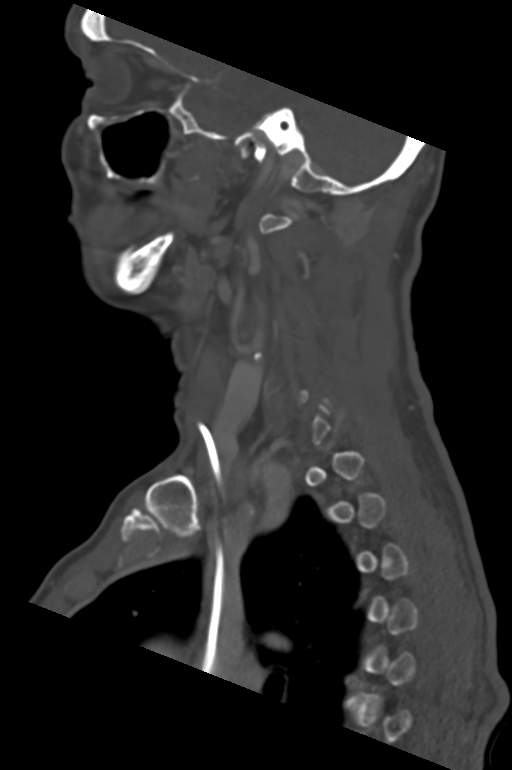
[im 80/159  soft-tissue]
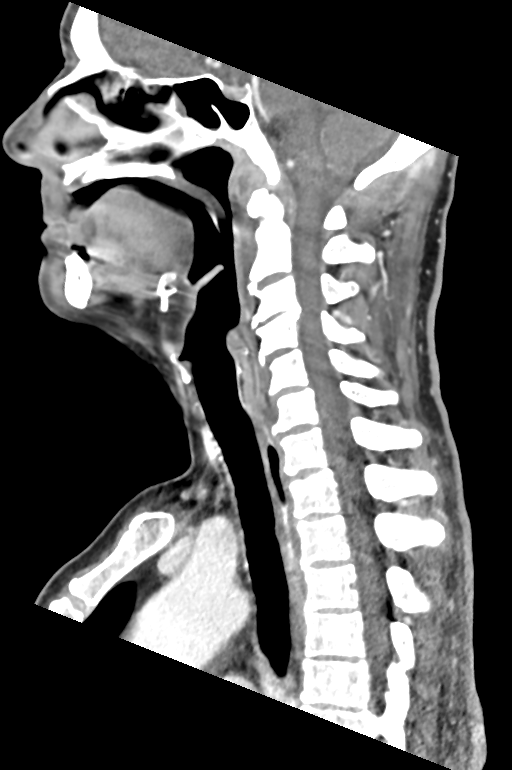
[im 80/159  bone]
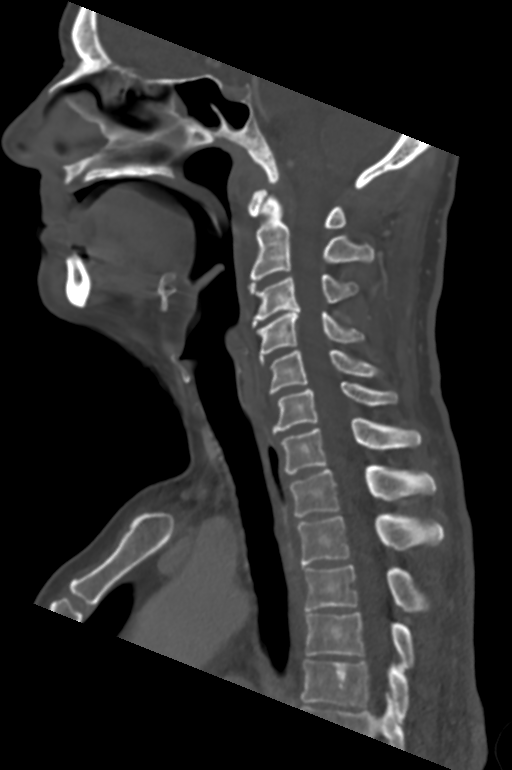
[im 93/159  bone]
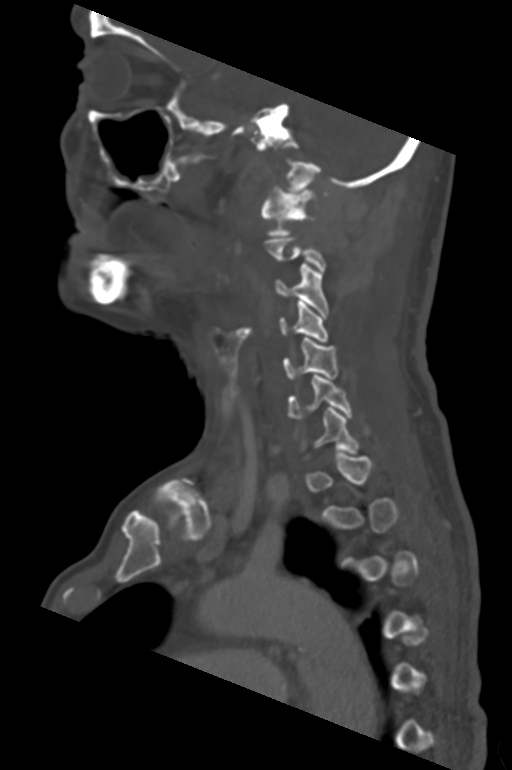
[im 106/159  bone]
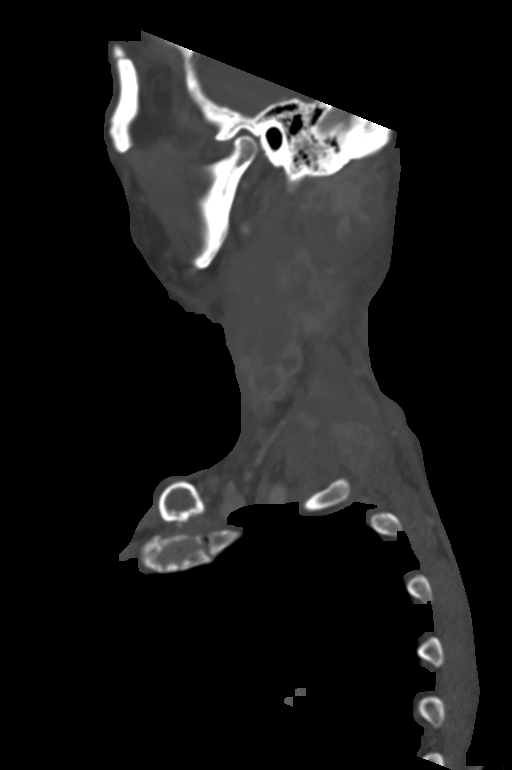

[Series 9: ax oropharynx neck 2.00 ax · axial · 0.46mm/px · z∈[-744,-549]mm · 4 of 177 slices shown, 5 images]
[im 36/177  soft-tissue]
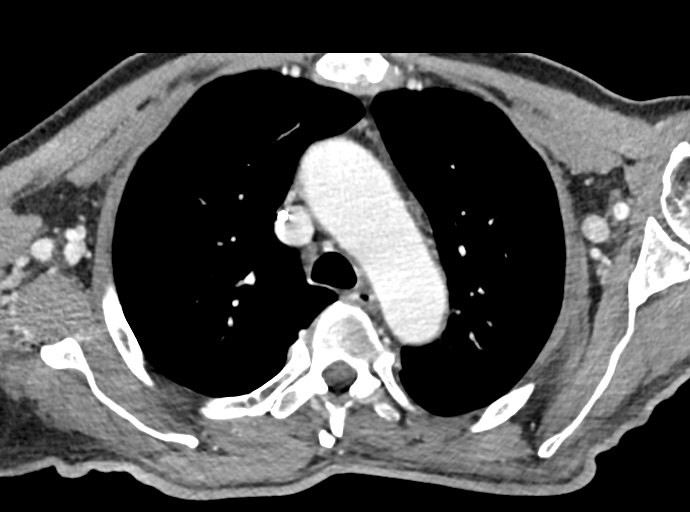
[im 36/177  bone]
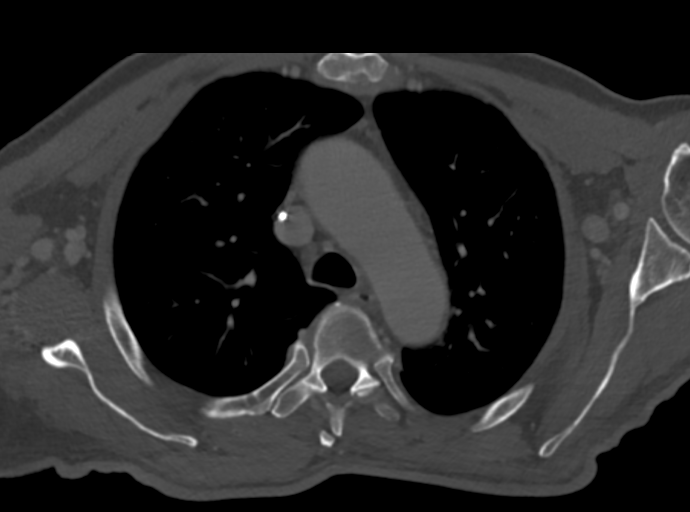
[im 71/177  bone]
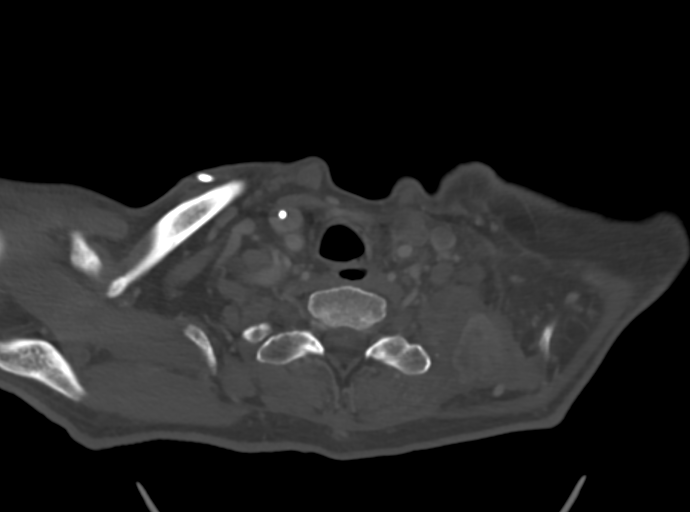
[im 106/177  bone]
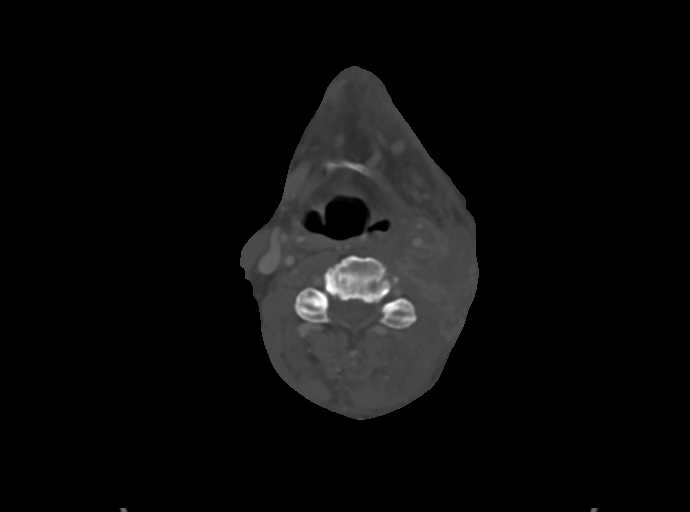
[im 141/177  bone]
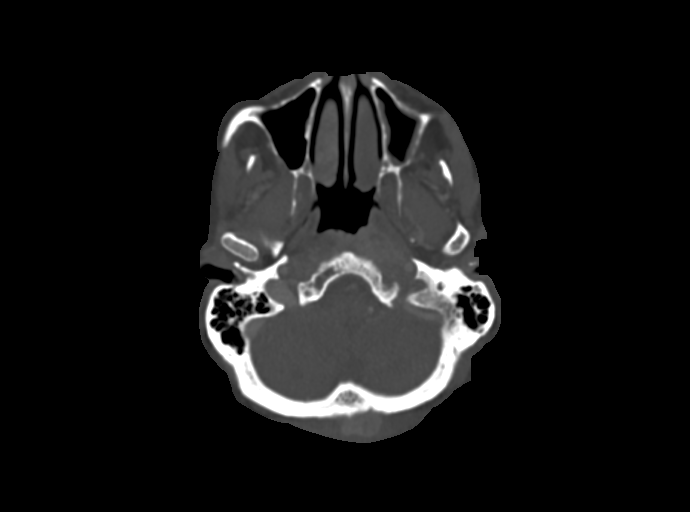

[12 of 33 positions shown; findings below may reference images not displayed]

RADIATION DOSE REDUCTION: This exam was performed according to the
departmental dose-optimization program which includes automated
exposure control, adjustment of the mA and/or kV according to
patient size and/or use of iterative reconstruction technique.

CONTRAST:  100mL OMNIPAQUE IOHEXOL 300 MG/ML  SOLN
FINDINGS: Pharynx and larynx: Dense fatty atrophy of the left tongue with
posterior bulging, denervation changes. Submucosal tumor has
infiltrated the bilateral nasopharynx since prior CT, known from PET
CT. Contiguous nodular tumor in the bilateral suboccipital scalp,
infiltrating the left transverse foramen at C1, and C2 with epidural
tumor in the left lateral canal and encasing the left vertebral
artery. Extensive necrotic disease in the left jugular chain and
deep neck musculature reaching the level of the levator scapula
posteriorly and the thoracic inlet on the left anteriorly.

Salivary glands: Generalized atrophy, asymmetric advanced on the
left at the submandibular neck

Thyroid: Negative

Lymph nodes: Extensive lymphadenopathy in the left neck as described
above.

Vascular: Tumoral encasement described above. Malignant occlusion of
the left IJ

Limited intracranial: Remote infarct in the inferior right temporal
lobe. No visible brain invasion.

Visualized orbits: Negative

Mastoids and visualized paranasal sinuses: Clear

Skeleton: Widespread left skull base sclerosis and heterogeneity
from direct invasion with widening of the left occipital sinus. Left
epidural tumor from the foramen magnum to C2, as above. Nonacute C1
left transverse process fracture.

Upper chest: Negative
IMPRESSION: Widespread nodal and muscular metastatic disease involving the left
more than right occiput put and neck with similar extent to PET CT
[DATE]. Infiltration of the left upper cervical epidural space
with skull base and C1 involvement.

## 2021-05-03 MED ORDER — IOHEXOL 300 MG/ML  SOLN
100.0000 mL | Freq: Once | INTRAMUSCULAR | Status: AC | PRN
  Administered 2021-05-03: 100 mL via INTRAVENOUS

## 2021-05-07 ENCOUNTER — Encounter: Payer: Self-pay | Admitting: Licensed Clinical Social Worker

## 2021-05-07 NOTE — Progress Notes (Signed)
[facility] CSW Progress Note ? ?Clinical Social Worker contacted caregiver by phone to discuss concerns over care needs.  Ms. Kent [name] stated she is unable to care for the patient and his care needs exceed her physical abilities. CSW stated Ms. Tobin Chad will need to contact DSS and ask to speak with a Medicaid Case Worker. Patient is currently receiving over 1,600 per month between pension and retirement. CSW explained the patient may not qualify for Medicaid which would assist with long-term care placement. Ms. Tobin Chad verbalized understanding and stated she would contact DSS.  CSW updated provider, palliative care NP and RN ? ? ? ?Jodeci Roarty , LCSW ?

## 2021-05-08 ENCOUNTER — Other Ambulatory Visit: Payer: Self-pay

## 2021-05-08 ENCOUNTER — Inpatient Hospital Stay: Payer: Medicare Other

## 2021-05-08 NOTE — Progress Notes (Signed)
Nutrition Follow-up: ? ?Patient with recurrent base of the tongue cancer.  Receiving chemotherapy, refused radiation.  ? ?Spoke with sister Butch Penny for follow-up per patient request.  Butch Penny says that appetite is up and down.  Usually has waffles or oatmeal for breakfast. Lunch is sandwich or hot dog or cheese dog.  Supper sister cooks meat and vegetables.  Patient likes drinking nesquick shakes a little bit better than ensure clear shakes right now per sister.  She does not report that patient complains of nausea.  Had issues with constipation, gave miralax then had diarrhea but that has improved.   ? ? ? ?Medications: reviewed ? ?Labs: reviewed ? ?Anthropometrics:  ? ?Weight 147 lb on 2/20 ? ?153 lb on 1/30 ?158 lb on 1/17 ?159 lb on 1/4 ? ? ?NUTRITION DIAGNOSIS: Unintentional weight loss continues ? ? ?INTERVENTION:  ?Encouraged nesquick shakes as often as able as higher in calories than ensure clear.   ?Reviewed ways to add calories and protein in diet with sister.  ?Patient may benefit from trial of appetite stimulant ?  ? ?MONITORING, EVALUATION, GOAL: weight trends, intake ? ? ?NEXT VISIT: Wednesday, April 5th phone call with sister  ? ?Torra Pala B. Zenia Resides, RD, LDN ?Registered Dietitian ?[phone] (mobile) ? ? ?

## 2021-05-09 ENCOUNTER — Inpatient Hospital Stay (HOSPITAL_BASED_OUTPATIENT_CLINIC_OR_DEPARTMENT_OTHER): Payer: Medicare Other | Admitting: Hospice and Palliative Medicine

## 2021-05-09 ENCOUNTER — Encounter: Payer: Self-pay | Admitting: Hospice and Palliative Medicine

## 2021-05-09 VITALS — BP 90/70 | HR 96 | Temp 97.1°F | Resp 16 | Wt 143.0 lb

## 2021-05-09 DIAGNOSIS — Z515 Encounter for palliative care: Secondary | ICD-10-CM

## 2021-05-09 DIAGNOSIS — R1310 Dysphagia, unspecified: Secondary | ICD-10-CM | POA: Diagnosis not present

## 2021-05-09 DIAGNOSIS — C01 Malignant neoplasm of base of tongue: Secondary | ICD-10-CM | POA: Diagnosis not present

## 2021-05-09 MED ORDER — AMOXICILLIN-POT CLAVULANATE 875-125 MG PO TABS: 1.0000 | ORAL_TABLET | Freq: Two times a day (BID) | ORAL | 0 refills | Status: DC

## 2021-05-09 NOTE — Progress Notes (Signed)
[place] at [facility] Telephone:(336) [phone] Fax:(336) [phone]   Name: Kent [name] Date: 05/09/2021 MRN: [id number]  DOB: [birth year]  Patient Care Team: Pcp, No as PCP - General Margaretha Sheffield, MD (Otolaryngology) Sindy Guadeloupe, MD as Consulting Physician (Oncology)    REASON FOR CONSULTATION: Kent [name] is a 66 y.o. 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  Patient met with Dr. Mike Gip following lymph node biopsy in September 2021 to discuss potential treatment options.  He was felt not to be a good surgical candidate.  Unfortunately, patient was ultimately lost to follow-up and then reestablished cancer care in December 2022.  Patient had an MRI of the brain at [facility] on 12/12/2020 revealing extensive metastatic disease with C1 and skull base invasion and left epidural canal infiltration without cord compression.  Patient has had severe neck and head pain.  Patient has been referred to radiation oncology.  Patient was referred to palliative care to help address goals.  SOCIAL HISTORY:     reports that he has never smoked. He has never used smokeless tobacco. He reports that he does not drink alcohol and does not use drugs.  Patient is divorced.  He lives at home with his sister.  Patient has a son and daughter.  Patient retired from [employer] where he worked in the [employer] facility.  ADVANCE DIRECTIVES:  On file - sister is HCPOA  CODE STATUS:   PAST MEDICAL HISTORY: Past Medical History:  Diagnosis Date   Arthritis    Diabetes mellitus without complication ([place])    Hyperlipidemia    Hypertension    Obesity    Osteomyelitis ([place])    From pt chart   Renal insufficiency    CKD noted on chart   Staph infection 05/29/2016   Nov 2017. 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   Tongue cancer ([place])    Stage IV squamous cell carcinoma of the base of the tongue/left tonsil    PAST SURGICAL HISTORY:  Past Surgical History:  Procedure Laterality Date   EXCISION OF TONGUE LESION Left 06/19/2016   Procedure: EXCISION OF TONGUE LESION;  Surgeon: Margaretha Sheffield, MD;  Location: [place];  Service: ENT;  Laterality: Left;  diabetic - insulin and oral meds - not using either currently   gastric tumor removed  2010   I & D EXTREMITY Left 01/09/2016   Procedure: IRRIGATION AND DEBRIDEMENT EXTREMITY;  Surgeon: Robert Bellow, MD;  Location: [facility];  Service: General;  Laterality: Left;   LARYNGOSCOPY N/A 06/19/2016   Procedure: LARYNGOSCOPY;  Surgeon: Margaretha Sheffield, MD;  Location: [place];  Service: ENT;  Laterality: N/A;   PERIPHERAL VASCULAR CATHETERIZATION Right 12/31/2015   Procedure: Lower Extremity Angiography;  Surgeon: Algernon Huxley, MD;  Location: [place] [facility];  Service: Cardiovascular;  Laterality: Right;   PORTA CATH INSERTION N/A 03/07/2021   Procedure: PORTA CATH INSERTION;  Surgeon: Algernon Huxley, MD;  Location: [place] [facility];  Service: Cardiovascular;  Laterality: N/A;   tongue mass biopsy      HEMATOLOGY/ONCOLOGY HISTORY:  Oncology History  Carcinoma of base of tongue ([place])  09/10/2015 Initial Diagnosis   Carcinoma of base  of tongue ([place])   03/11/2021 -  Chemotherapy   Patient is on Treatment Plan :  Carboplatin (5) + Paclitaxel (175) + Pembrolizumab (200) D1 q21d x 4 cycles / Pembrolizumab (200) Maintenance D1 q21d       ALLERGIES:  has No Known Allergies.  MEDICATIONS:  Current Outpatient Medications  Medication Sig Dispense Refill   amLODipine (NORVASC) 10 MG tablet Take 10 mg by mouth daily.     aspirin EC 81 MG EC tablet Take 1 tablet (81 mg total) by mouth daily.     atorvastatin (LIPITOR) 40 MG tablet Take 1  tablet (40 mg total) by mouth daily.     calcium-vitamin D (OSCAL WITH D) 500-5 MG-MCG tablet Take 1 tablet by mouth daily with breakfast. 30 tablet 0   LANTUS SOLOSTAR 100 UNIT/ML Solostar Pen Inject 18 Units into the skin at bedtime.     levothyroxine (SYNTHROID) 25 MCG tablet Take 25 mcg by mouth daily.     lidocaine-prilocaine (EMLA) cream Apply to affected area once 30 g 3   LORazepam (ATIVAN) 0.5 MG tablet Take 1 tablet (0.5 mg total) by mouth every 6 (six) hours as needed (Nausea or vomiting). 30 tablet 0   losartan (COZAAR) 50 MG tablet Take 50 mg by mouth daily.     oxyCODONE (OXY IR/ROXICODONE) 5 MG immediate release tablet Take 1 tablet (5 mg total) by mouth every 6 (six) hours as needed for severe pain. 60 tablet 0   polyethylene glycol (MIRALAX / GLYCOLAX) 17 g packet Take 17 g by mouth 2 (two) times daily. (Patient taking differently: Take 17 g by mouth daily as needed.) 14 each 0   potassium chloride 20 MEQ/15ML (10%) SOLN TAKE 15 MLS BY MOUTH DAILY. 210 mL 0   dexamethasone (DECADRON) 4 MG tablet Take 2 tablets (8 mg total) by mouth daily. Start the day after carboplatin chemotherapy for 3 days. (Patient not taking: Reported on 05/09/2021) 30 tablet 1   FLONASE ALLERGY RELIEF 50 MCG/ACT nasal spray SMARTSIG:2 Spray(s) Both Nares Daily PRN (Patient not taking: Reported on 05/09/2021)     magic mouthwash w/lidocaine SOLN Take 5 mLs by mouth 4 (four) times daily as needed for mouth pain. (Patient not taking: Reported on 05/09/2021) 120 mL 1   ondansetron (ZOFRAN) 8 MG tablet Take 1 tablet (8 mg total) by mouth 2 (two) times daily as needed for refractory nausea / vomiting. Start on day 3 after carboplatin chemo. (Patient not taking: Reported on 05/09/2021) 30 tablet 1   prochlorperazine (COMPAZINE) 10 MG tablet Take 1 tablet (10 mg total) by mouth every 6 (six) hours as needed (Nausea or vomiting). (Patient not taking: Reported on 05/09/2021) 30 tablet 1   No current facility-administered  medications for this visit.    VITAL SIGNS: There were no vitals taken for this visit. There were no vitals filed for this visit.  Estimated body mass index is 23.02 kg/m as calculated from the following:   Height as of 03/11/21: _0  (1.702 m).   Weight as of 04/22/21: 147 lb (66.7 kg).  LABS: CBC:    Component Value Date/Time   WBC 10.0 04/22/2021 0811   HGB 10.6 (L) 04/22/2021 0811   HGB 15.2 02/14/2013 1204   HCT 32.5 (L) 04/22/2021 0811   HCT 43.6 02/14/2013 1204   PLT 284 04/22/2021 0811   PLT 188 02/14/2013 1204   MCV 88.3 04/22/2021 0811   MCV 85 02/14/2013 1204   NEUTROABS 7.8 (H) 04/22/2021 1443  LYMPHSABS 0.9 04/22/2021 0811   MONOABS 1.0 04/22/2021 0811   EOSABS 0.1 04/22/2021 0811   BASOSABS 0.1 04/22/2021 0811   Comprehensive Metabolic Panel:    Component Value Date/Time   NA 129 (L) 05/02/2021 0855   NA 125 (L) 01/07/2016 1322   NA 127 (L) 02/14/2013 1204   K 3.4 (L) 05/02/2021 0855   K 3.8 02/14/2013 1204   CL 93 (L) 05/02/2021 0855   CL 93 (L) 02/14/2013 1204   CO2 27 05/02/2021 0855   CO2 28 02/14/2013 1204   BUN 13 05/02/2021 0855   BUN 12 01/07/2016 1322   BUN 21 (H) 02/14/2013 1204   CREATININE 0.69 05/02/2021 0855   CREATININE 1.63 (H) 02/14/2013 1204   GLUCOSE 105 (H) 05/02/2021 0855   GLUCOSE 420 (H) 02/14/2013 1204   CALCIUM 7.9 (L) 05/02/2021 0855   CALCIUM 8.8 02/14/2013 1204   AST 19 04/22/2021 0811   AST 31 02/14/2013 1204   ALT 13 04/22/2021 0811   ALT 32 02/14/2013 1204   ALKPHOS 108 04/22/2021 0811   ALKPHOS 121 (H) 02/14/2013 1204   BILITOT 0.4 04/22/2021 0811   BILITOT 1.0 01/07/2016 1322   BILITOT 1.0 02/14/2013 1204   PROT 6.5 04/22/2021 0811   PROT 5.2 (L) 01/07/2016 1322   PROT 7.1 02/14/2013 1204   ALBUMIN 2.8 (L) 04/22/2021 0811   ALBUMIN 2.8 (L) 01/07/2016 1322   ALBUMIN 3.5 02/14/2013 1204    RADIOGRAPHIC STUDIES: CT SOFT TISSUE NECK W CONTRAST  Result Date: 05/03/2021 CLINICAL DATA:  History of tongue  cancer EXAM: CT NECK WITH CONTRAST TECHNIQUE: Multidetector CT imaging of the neck was performed using the standard protocol following the bolus administration of intravenous contrast. RADIATION DOSE REDUCTION: This exam was performed according to the departmental dose-optimization program which includes automated exposure control, adjustment of the mA and/or kV according to patient size and/or use of iterative reconstruction technique. CONTRAST:  13m OMNIPAQUE IOHEXOL 300 MG/ML  SOLN COMPARISON:  02/21/2021 head CT.  06/24/2019 neck CT FINDINGS: Pharynx and larynx: Dense fatty atrophy of the left tongue with posterior bulging, denervation changes. Submucosal tumor has infiltrated the bilateral nasopharynx since prior CT, known from PET CT. Contiguous nodular tumor in the bilateral suboccipital scalp, infiltrating the left transverse foramen at C1, and C2 with epidural tumor in the left lateral canal and encasing the left vertebral artery. Extensive necrotic disease in the left jugular chain and deep neck musculature reaching the level of the levator scapula posteriorly and the thoracic inlet on the left anteriorly. Salivary glands: Generalized atrophy, asymmetric advanced on the left at the submandibular neck Thyroid: Negative Lymph nodes: Extensive lymphadenopathy in the left neck as described above. Vascular: Tumoral encasement described above. Malignant occlusion of the left IJ Limited intracranial: Remote infarct in the inferior right temporal lobe. No visible brain invasion. Visualized orbits: Negative Mastoids and visualized paranasal sinuses: Clear Skeleton: Widespread left skull base sclerosis and heterogeneity from direct invasion with widening of the left occipital sinus. Left epidural tumor from the foramen magnum to C2, as above. Nonacute C1 left transverse process fracture. Upper chest: Negative IMPRESSION: Widespread nodal and muscular metastatic disease involving the left more than right occiput  put and neck with similar extent to PET CT 02/21/2021. Infiltration of the left upper cervical epidural space with skull base and C1 involvement. Electronically Signed   By: JJorje GuildM.D.   On: 05/03/2021 11:55   CT CHEST ABDOMEN PELVIS W CONTRAST  Result Date: 05/04/2021 CLINICAL DATA:  Carcinoma of the tongue base, ongoing chemotherapy EXAM: CT CHEST, ABDOMEN, AND PELVIS WITH CONTRAST TECHNIQUE: Multidetector CT imaging of the chest, abdomen and pelvis was performed following the standard protocol during bolus administration of intravenous contrast. RADIATION DOSE REDUCTION: This exam was performed according to the departmental dose-optimization program which includes automated exposure control, adjustment of the mA and/or kV according to patient size and/or use of iterative reconstruction technique. CONTRAST:  139m OMNIPAQUE IOHEXOL 300 MG/ML SOLN additional oral enteric contrast COMPARISON:  PET-CT, 02/21/2021 FINDINGS: CT CHEST FINDINGS Cardiovascular: Right chest port catheter. Aortic atherosclerosis. Normal heart size. Three-vessel coronary artery calcifications. No pericardial effusion. Mediastinum/Nodes: Rim enhancing, necrotic appearing lymph nodes in the anterior and posterior lower left neck, measuring up to 2.7 x 1.9 cm (series 5, image 1). No enlarged mediastinal, hilar, or axillary lymph nodes. Thyroid gland, trachea, and esophagus demonstrate no significant findings. Lungs/Pleura: Significant interval increase in extensive heterogeneous, ground-glass and consolidative airspace disease throughout the lungs and dense consolidation at the left lung base, with some associated tubular and varicoid bronchiectasis. There are no discrete nodules at this time. No pleural effusion or pneumothorax. Musculoskeletal: No chest wall mass or suspicious osseous lesions identified. CT ABDOMEN PELVIS FINDINGS Hepatobiliary: No solid liver abnormality is seen. No gallstones, gallbladder wall thickening, or  biliary dilatation. Pancreas: Unremarkable. No pancreatic ductal dilatation or surrounding inflammatory changes. Spleen: Normal in size without significant abnormality. Adrenals/Urinary Tract: Adrenal glands are unremarkable. Kidneys are normal, without renal calculi, solid lesion, or hydronephrosis. Bladder is unremarkable. Stomach/Bowel: Stomach is within normal limits. Appendix appears normal. No evidence of bowel wall thickening, distention, or inflammatory changes. Sigmoid diverticula. Vascular/Lymphatic: Aortic atherosclerosis. No enlarged abdominal or pelvic lymph nodes. Reproductive: Mild prostatomegaly with median lobe hypertrophy. Other: No abdominal wall hernia or abnormality. No ascites. Musculoskeletal: No acute osseous findings. IMPRESSION: 1. Rim enhancing, necrotic appearing lymph nodes in the anterior and posterior lower left neck, consistent with nodal metastatic disease, and better assessed by simultaneous CT examination of the neck, separately reported. 2. No other evidence of metastatic disease within the chest, abdomen, or pelvis. 3. Significant interval increase in extensive heterogeneous, ground-glass and consolidative airspace disease throughout the lungs and dense consolidation at the left lung base. Findings are consistent with multifocal infection and/or aspiration. There are no discrete nodules at this time. 4. Coronary artery disease. Aortic Atherosclerosis (ICD10-I70.0). Electronically Signed   By: ADelanna AhmadiM.D.   On: 05/04/2021 19:55    PERFORMANCE STATUS (ECOG) : 2 - Symptomatic, <50% confined to bed  Review of Systems Unless otherwise noted, a complete review of systems is negative.  Physical Exam General: NAD HEENT: Edentulous, no thrush or ulcerations, clear nasal discharge Cardiac: RRR Pulmonary: Coarse Extremities: no edema, no joint deformities Skin: no rashes Neurological: Weakness but otherwise nonfocal  IMPRESSION: Follow-up visit.    CT of the chest,  abdomen, and pelvis on 05/03/2021 which revealed rim-enhancing necrotic lymph nodes anterior and posterior lower neck consistent with nodal metastatic disease but no other evidence of metastatic disease in chest, abdomen, or pelvis.  Patient was also noted to have groundglass and consolidative airspace disease throughout lungs concerning for multifocal infection.  Today, patient reports he is doing about the same.  He has cough and congestion which she attributes to chronic sinus drainage/postnasal drip.  He denies shortness of breath or fever or chills.  Patient does endorse chronic positional dizziness but says that this has been ongoing for months and is unchanged in severity or characteristic.  He  feels like he is drinking adequate fluids.  He denies pain or other symptomatic complaints at present.  PLAN: -Continue current scope of treatment -ACP documents on file -Patient previously sent home with a MOST form to review with family -Start Augmentin -Referral for SLP -Follow-up telephone visit 1 month   Patient expressed understanding and was in agreement with this plan. He also understands that He can call the clinic at any time with any questions, concerns, or complaints.     Time Total: 15 minutes  Visit consisted of counseling and education dealing with the complex and emotionally intense issues of symptom management and palliative care in the setting of serious and potentially life-threatening illness.Greater than 50%  of this time was spent counseling and coordinating care related to the above assessment and plan.  Signed by: Altha Harm, PhD, NP-C

## 2021-05-09 NOTE — Addendum Note (Signed)
Addended by: Altha Harm R on: 05/09/2021 05:44 PM ? ? Modules accepted: Orders ? ?

## 2021-05-09 NOTE — Progress Notes (Signed)
Pt in for follow up, palliative care visit today. Reports being dizzy and has been having dizziness for several days.   ?

## 2021-05-14 ENCOUNTER — Ambulatory Visit: Payer: Medicare Other

## 2021-05-17 ENCOUNTER — Encounter: Payer: Self-pay | Admitting: Internal Medicine

## 2021-05-17 ENCOUNTER — Ambulatory Visit
Admission: RE | Admit: 2021-05-17 | Discharge: 2021-05-17 | Disposition: A | Discharge status: Home / Self Care | Payer: Medicare Other | Source: Ambulatory Visit | Attending: Hospice and Palliative Medicine | Admitting: Hospice and Palliative Medicine

## 2021-05-17 ENCOUNTER — Inpatient Hospital Stay
Admission: EM | Admit: 2021-05-17 | Discharge: 2021-05-21 | DRG: 177 | Disposition: A | Discharge status: Hospice | Payer: Medicare Other | Attending: Internal Medicine | Admitting: Internal Medicine

## 2021-05-17 ENCOUNTER — Emergency Department: Payer: Medicare Other

## 2021-05-17 ENCOUNTER — Other Ambulatory Visit: Payer: Self-pay | Admitting: *Deleted

## 2021-05-17 ENCOUNTER — Other Ambulatory Visit: Payer: Self-pay

## 2021-05-17 DIAGNOSIS — Z20822 Contact with and (suspected) exposure to covid-19: Secondary | ICD-10-CM | POA: Diagnosis present

## 2021-05-17 DIAGNOSIS — J690 Pneumonitis due to inhalation of food and vomit: Secondary | ICD-10-CM | POA: Diagnosis not present

## 2021-05-17 DIAGNOSIS — Z8249 Family history of ischemic heart disease and other diseases of the circulatory system: Secondary | ICD-10-CM

## 2021-05-17 DIAGNOSIS — D649 Anemia, unspecified: Secondary | ICD-10-CM | POA: Diagnosis not present

## 2021-05-17 DIAGNOSIS — Z794 Long term (current) use of insulin: Secondary | ICD-10-CM

## 2021-05-17 DIAGNOSIS — Z6822 Body mass index (BMI) 22.0-22.9, adult: Secondary | ICD-10-CM

## 2021-05-17 DIAGNOSIS — I110 Hypertensive heart disease with heart failure: Secondary | ICD-10-CM | POA: Diagnosis present

## 2021-05-17 DIAGNOSIS — R1310 Dysphagia, unspecified: Secondary | ICD-10-CM | POA: Insufficient documentation

## 2021-05-17 DIAGNOSIS — Z8673 Personal history of transient ischemic attack (TIA), and cerebral infarction without residual deficits: Secondary | ICD-10-CM

## 2021-05-17 DIAGNOSIS — Z515 Encounter for palliative care: Secondary | ICD-10-CM

## 2021-05-17 DIAGNOSIS — Z809 Family history of malignant neoplasm, unspecified: Secondary | ICD-10-CM

## 2021-05-17 DIAGNOSIS — I5032 Chronic diastolic (congestive) heart failure: Secondary | ICD-10-CM | POA: Diagnosis present

## 2021-05-17 DIAGNOSIS — Z79899 Other long term (current) drug therapy: Secondary | ICD-10-CM

## 2021-05-17 DIAGNOSIS — Z7982 Long term (current) use of aspirin: Secondary | ICD-10-CM

## 2021-05-17 DIAGNOSIS — Z85028 Personal history of other malignant neoplasm of stomach: Secondary | ICD-10-CM

## 2021-05-17 DIAGNOSIS — I10 Essential (primary) hypertension: Secondary | ICD-10-CM | POA: Diagnosis present

## 2021-05-17 DIAGNOSIS — C01 Malignant neoplasm of base of tongue: Secondary | ICD-10-CM | POA: Insufficient documentation

## 2021-05-17 DIAGNOSIS — Z66 Do not resuscitate: Secondary | ICD-10-CM | POA: Diagnosis not present

## 2021-05-17 DIAGNOSIS — D509 Iron deficiency anemia, unspecified: Secondary | ICD-10-CM

## 2021-05-17 DIAGNOSIS — E43 Unspecified severe protein-calorie malnutrition: Secondary | ICD-10-CM | POA: Diagnosis present

## 2021-05-17 DIAGNOSIS — E785 Hyperlipidemia, unspecified: Secondary | ICD-10-CM | POA: Diagnosis present

## 2021-05-17 DIAGNOSIS — D696 Thrombocytopenia, unspecified: Secondary | ICD-10-CM | POA: Diagnosis present

## 2021-05-17 DIAGNOSIS — E119 Type 2 diabetes mellitus without complications: Secondary | ICD-10-CM

## 2021-05-17 LAB — BASIC METABOLIC PANEL
Anion gap: 8 (ref 5–15)
BUN: 11 mg/dL (ref 8–23)
CO2: 30 mmol/L (ref 22–32)
Calcium: 8.4 mg/dL — ABNORMAL LOW (ref 8.9–10.3)
Chloride: 97 mmol/L — ABNORMAL LOW (ref 98–111)
Creatinine, Ser: 0.69 mg/dL (ref 0.61–1.24)
GFR, Estimated: >60 mL/min (ref >60)
Glucose, Bld: 90 mg/dL (ref 70–99)
Potassium: 3.7 mmol/L (ref 3.5–5.1)
Sodium: 135 mmol/L (ref 135–145)

## 2021-05-17 LAB — CBC
HCT: 31.9 % — ABNORMAL LOW (ref 39.0–52.0)
Hemoglobin: 10 g/dL — ABNORMAL LOW (ref 13.0–17.0)
MCH: 27.8 pg (ref 26.0–34.0)
MCHC: 31.3 g/dL (ref 30.0–36.0)
MCV: 88.6 fL (ref 80.0–100.0)
Platelets: 226 10*3/uL (ref 150–400)
RBC: 3.6 MIL/uL — ABNORMAL LOW (ref 4.22–5.81)
RDW: 17.1 % — ABNORMAL HIGH (ref 11.5–15.5)
WBC: 9.2 10*3/uL (ref 4.0–10.5)
nRBC: 0 % (ref 0.0–0.2)

## 2021-05-17 LAB — T4, FREE: Free T4: 0.83 ng/dL (ref 0.61–1.12)

## 2021-05-17 LAB — RESP PANEL BY RT-PCR (FLU A&B, COVID) ARPGX2
Influenza A by PCR: NEGATIVE
Influenza B by PCR: NEGATIVE
SARS Coronavirus 2 by RT PCR: NEGATIVE

## 2021-05-17 IMAGING — CR DG CHEST 2V
1 series · 2 of 2 positions shown · non-contrast
Comparison: [DATE]

CLINICAL DATA: Patient aspirated during a swallowing study today.

EXAM:
CHEST - 2 VIEW

[Series 1: dg chest 2 view · 0.14mm/px · 2 of 2 slices shown]
[im 1/2]
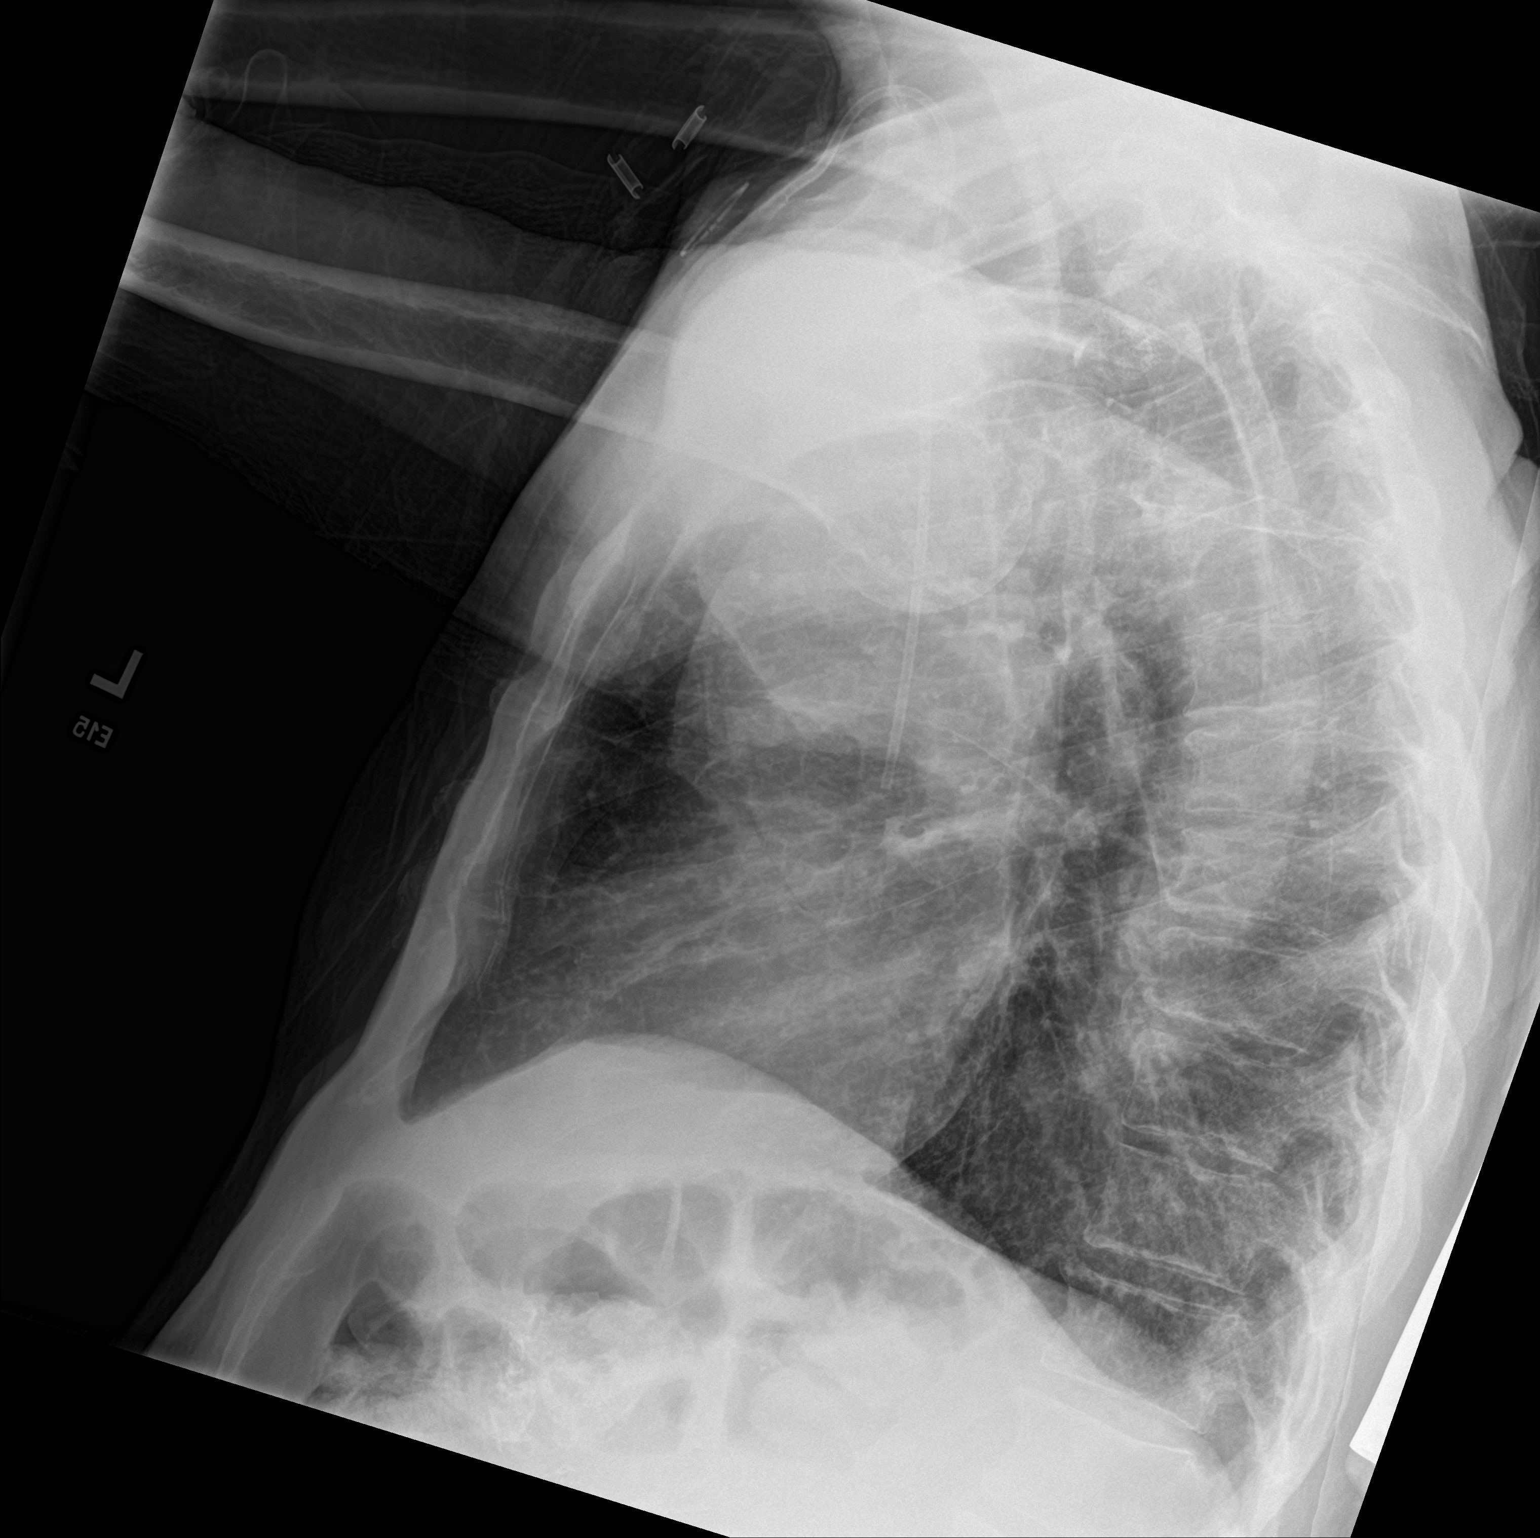
[im 2/2]
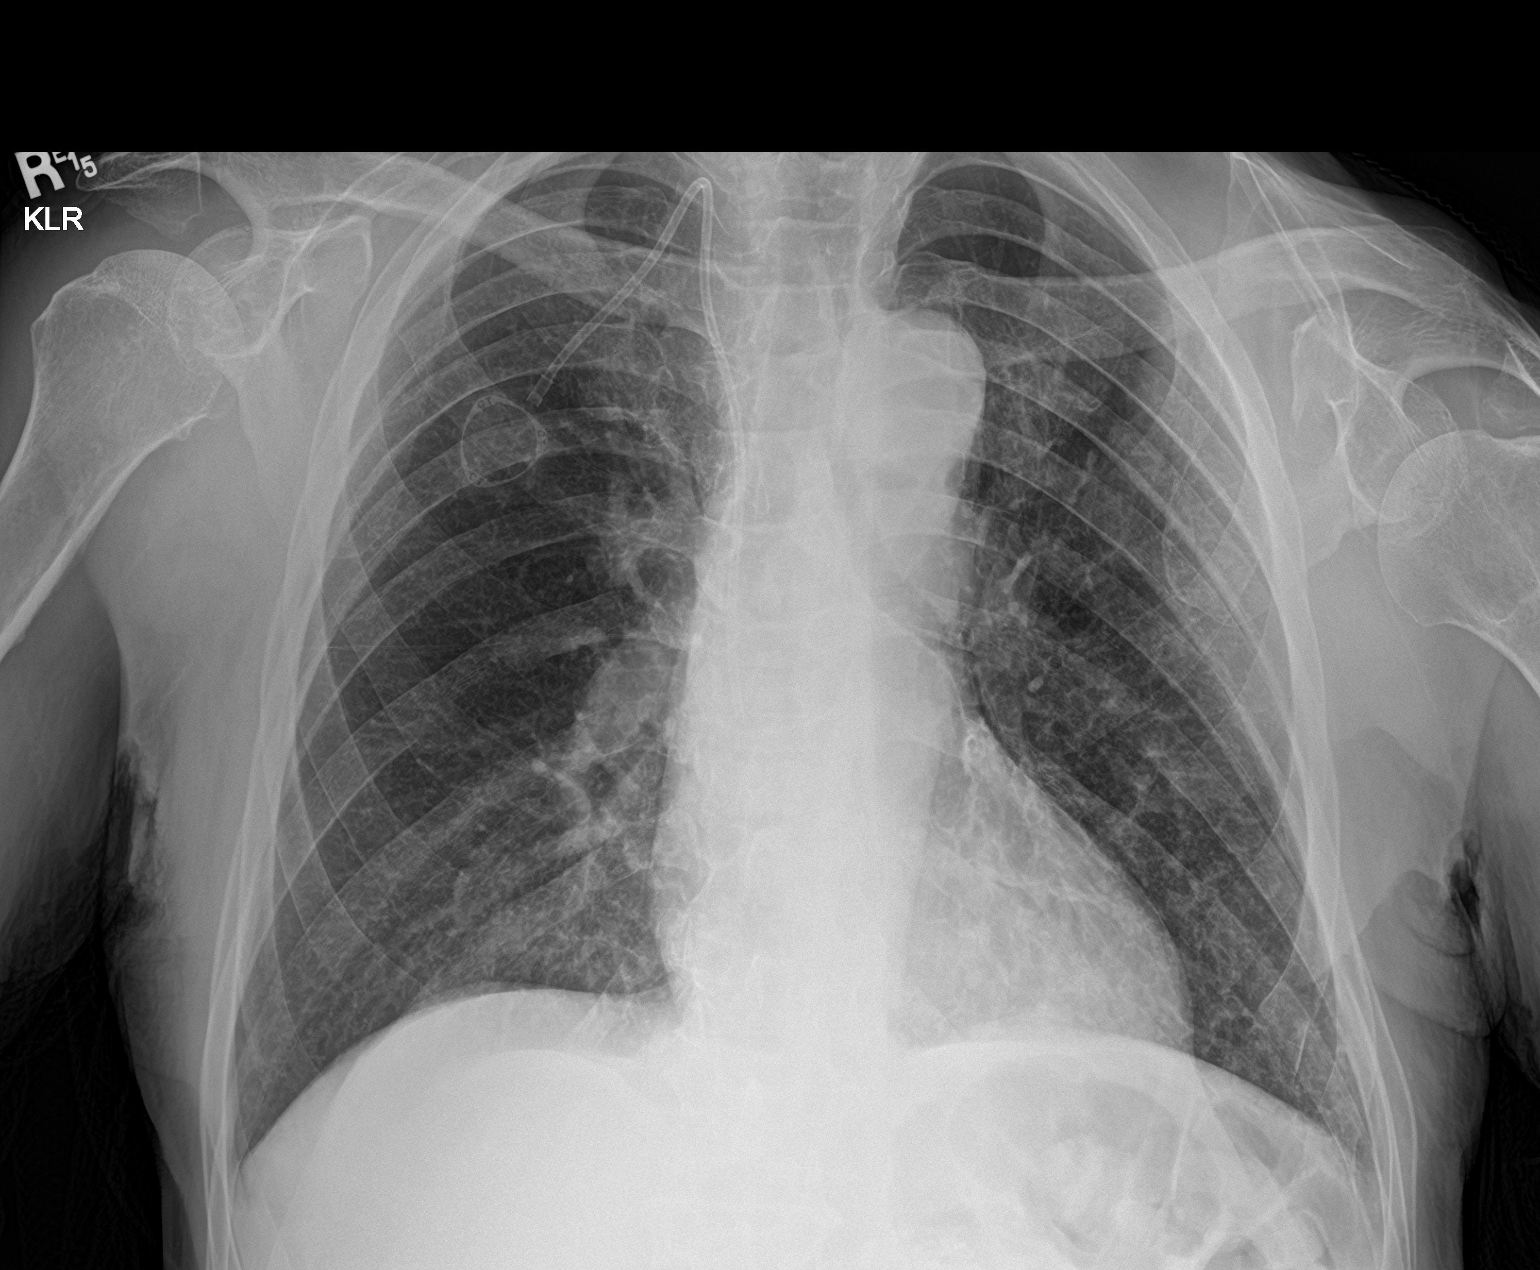

[2 of 2 positions shown; findings below may reference images not displayed]

FINDINGS: Cardiac silhouette is normal in size and configuration. Normal
mediastinal and hilar contours.

Lungs demonstrate mild bilateral interstitial prominence, stable and
chronic. No lung consolidation. No evidence of aspiration
pneumonitis. No pulmonary edema.

No pleural effusion or pneumothorax.

Right anterior chest wall, internal jugular, Port-A-Cath has its tip
in the lower superior vena cava.

Skeletal structures are intact.
IMPRESSION: No active cardiopulmonary disease.

## 2021-05-17 IMAGING — CT CT CHEST W/O CM
2 of 4 series · 15 of 36 positions shown, 18 images · non-contrast
Comparison: [DATE].

CLINICAL DATA: Recent swallowing study today with possible
aspiration.



[Series 2: thorax · axial · 0.72mm/px · z∈[-520,-230]mm · 12 of 173 slices shown, 15 images]
[im 14/173  mediastinal]
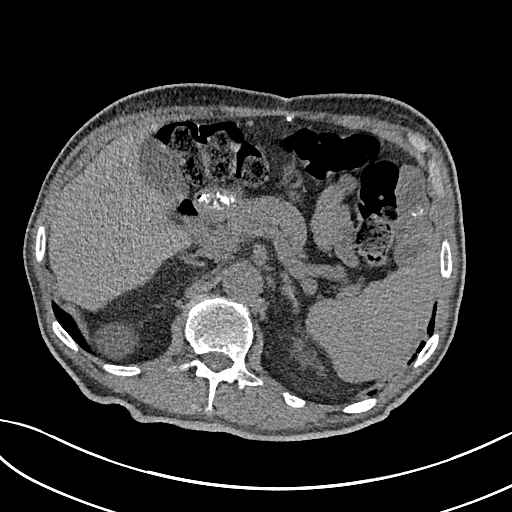
[im 14/173  lung]
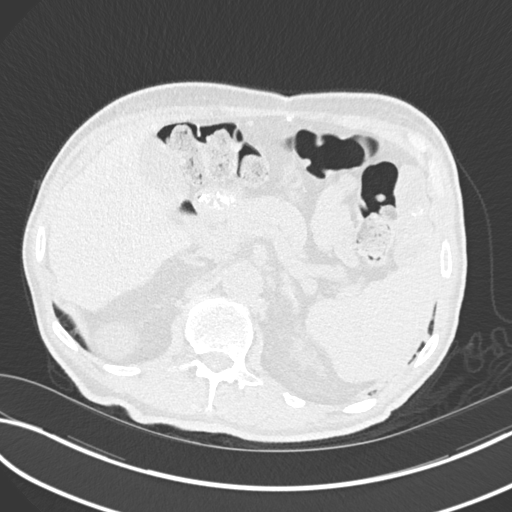
[im 27/173  lung]
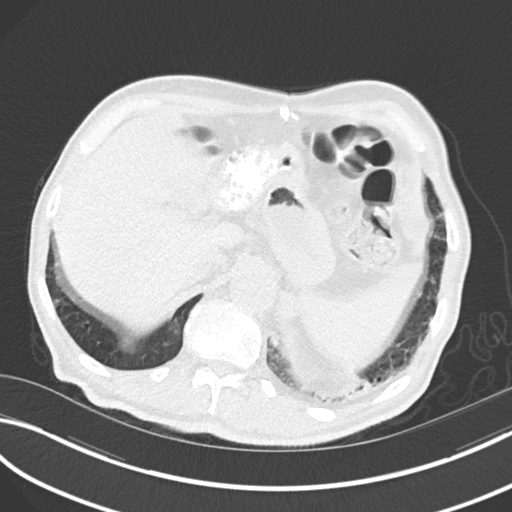
[im 40/173  lung]
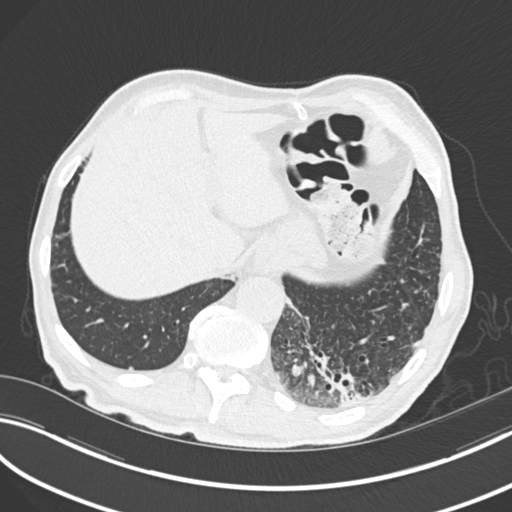
[im 53/173  lung]
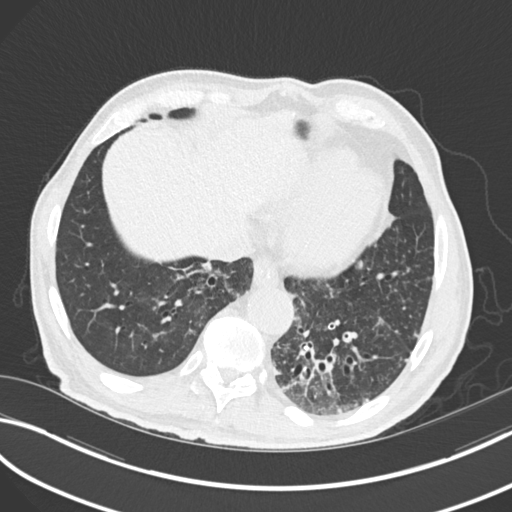
[im 67/173  mediastinal]
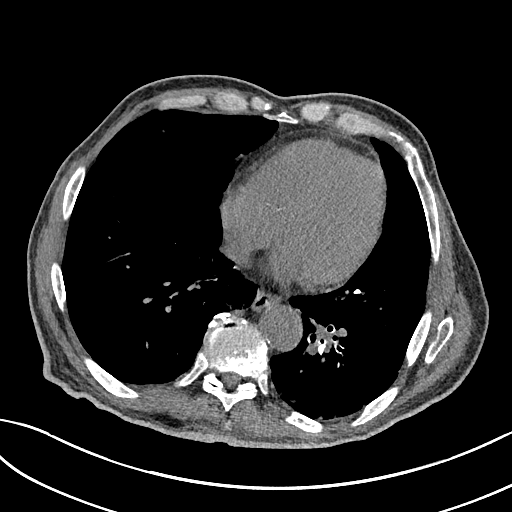
[im 67/173  lung]
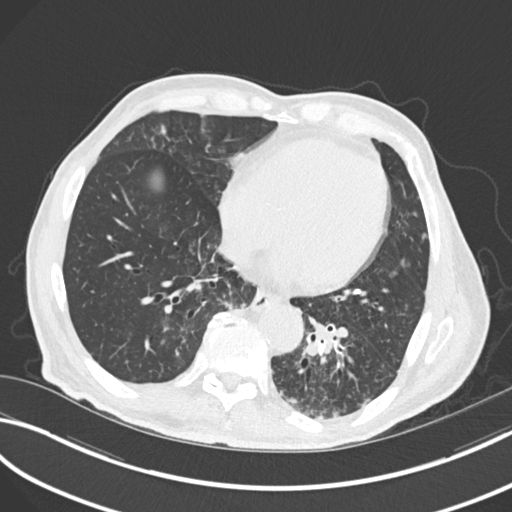
[im 80/173  lung]
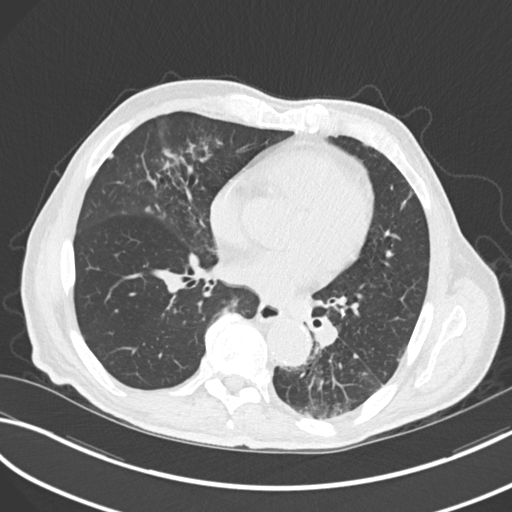
[im 93/173  lung]
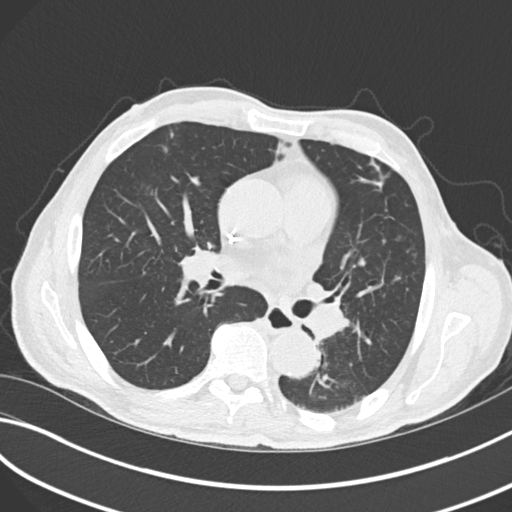
[im 106/173  lung]
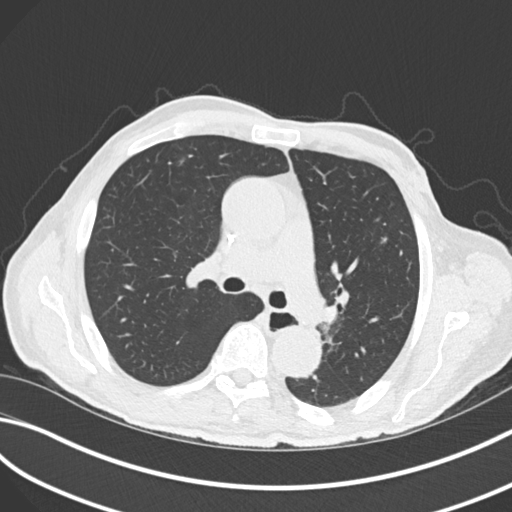
[im 120/173  mediastinal]
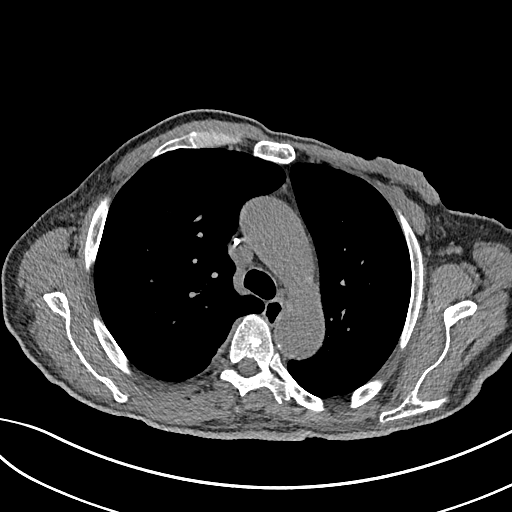
[im 120/173  lung]
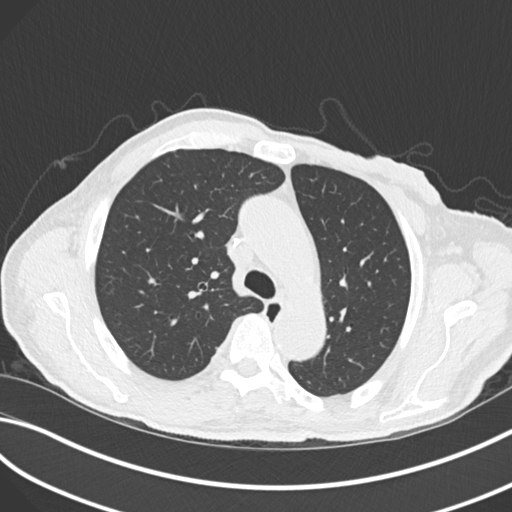
[im 133/173  lung]
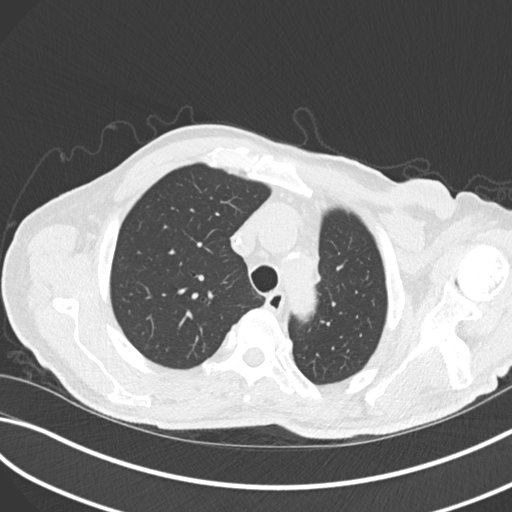
[im 146/173  lung]
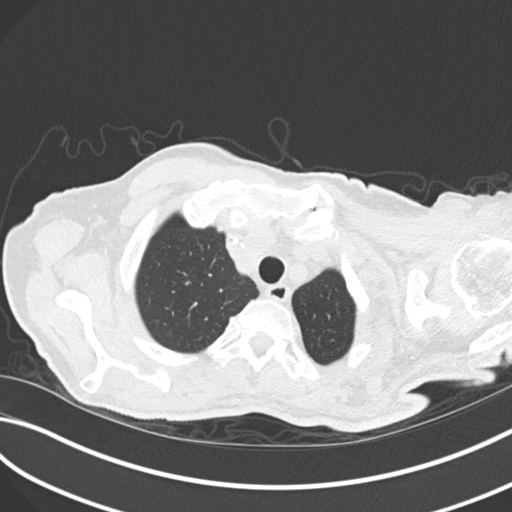
[im 159/173  lung]
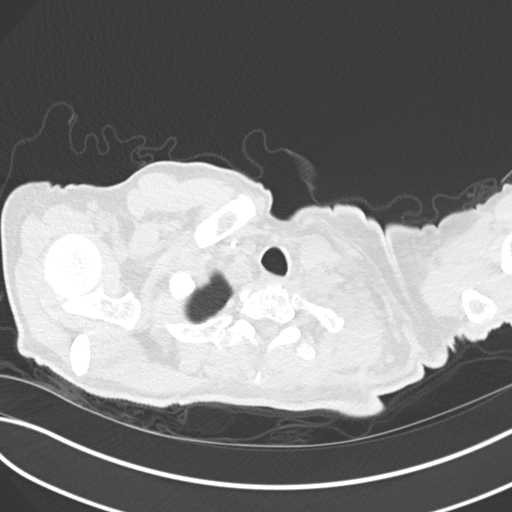

[Series 5: coronal · coronal · 0.76mm/px · 3 of 137 slices shown]
[im 28/137  lung]
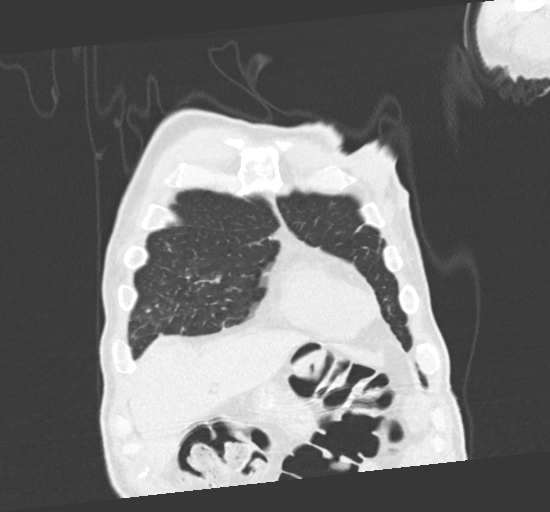
[im 55/137  lung]
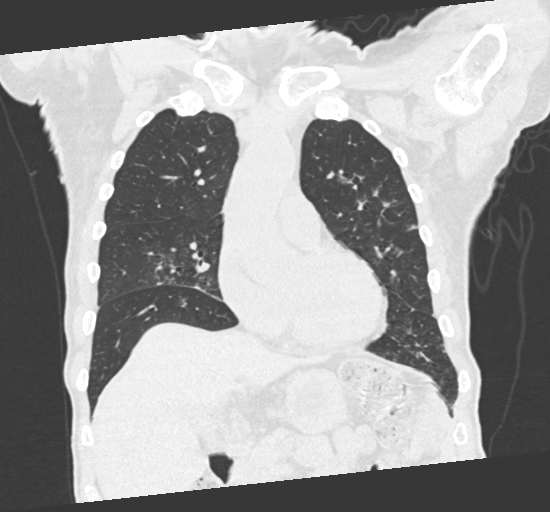
[im 82/137  lung]
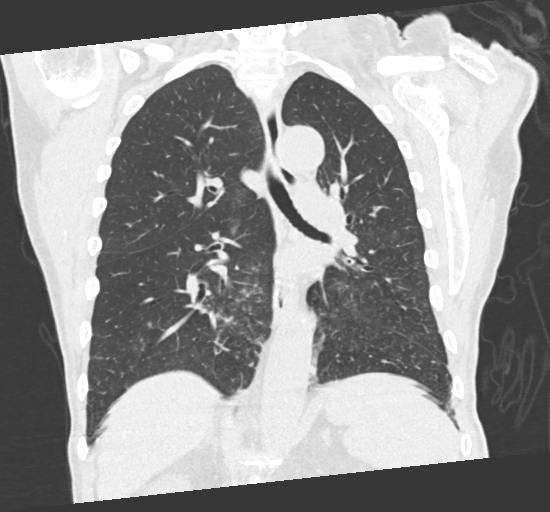

[15 of 36 positions shown; findings below may reference images not displayed]

FINDINGS: Cardiovascular: A right-sided venous Port-A-Cath is noted. There is
mild to moderate severity calcification of the aortic arch and
descending thoracic aorta. Normal heart size with mild coronary
artery calcification. No pericardial effusion.

Mediastinum/Nodes: No enlarged mediastinal or axillary lymph nodes.
Thyroid gland and esophagus demonstrate no significant findings. A
mild amount of contrast attenuation is seen along the walls of the
proximal left upper lobe and left lower lobe branches of the left
mainstem bronchus. Involvement of multiple left lower lobe
bronchioles is seen.

Lungs/Pleura: Mild, predominant stable areas of airspace disease are
seen within the bilateral upper lobes and bilateral lower lobes.

Mild to moderate severity left basilar consolidation is noted which
is decreased in severity when compared to the prior study. Marked
severity bronchiectatic changes are again seen within this region.

A stable 4 mm noncalcified lung nodule is seen within the inferior
medial aspect of the left upper lobe (axial CT image 122, CT series
3).

There is no evidence of a pleural effusion or pneumothorax.

Upper Abdomen: A 3.8 cm x 2.7 cm cyst is seen along the posterior
aspect of the upper pole of the right kidney.

Musculoskeletal: Degenerative changes seen throughout the thoracic
spine.
IMPRESSION: 1. Mild amount of aspirated contrast along the walls of the proximal
left upper lobe and left lower lobe branches of the left mainstem
bronchus with involvement of multiple left lower lobe bronchioles.
2. Mild to moderate severity left basilar consolidation which is
decreased in severity when compared to the prior study.
3. Mild, predominant stable areas of bilateral airspace disease.
4. Stable 4 mm noncalcified lung nodule within the inferior medial
aspect of the left upper lobe. No follow-up needed if patient is
low-risk.This recommendation follows the consensus statement:
Guidelines for Management of Incidental Pulmonary Nodules Detected
[DATE].
5. 3.8 cm x 2.7 cm cyst along the posterior aspect of the upper pole
of the right kidney.

Aortic Atherosclerosis ([CR]-[CR]).

## 2021-05-17 IMAGING — RF DG SWALLOWING FUNCTION
12 series · 15 of 24 positions shown · non-contrast
Comparison: None.

CLINICAL DATA: Dysphagia. History of head neck cancer.

EXAM:
MODIFIED BARIUM SWALLOW
TECHNIQUE: Different consistencies of barium were administered orally to the
patient by the Speech Pathologist. Imaging of the pharynx was
performed in the lateral projection. The radiologist was present in
the fluoroscopy room for this study, providing personal supervision.
FLUOROSCOPY:
Fluoroscopy Time:  36 seconds
Radiation Exposure Index (if provided by the fluoroscopic device):
1.2 mGy
Number of Acquired Spot Images: 0

[Series 1: run · 1 of 17 frames shown (1 of 11)]
[frame 3/17]
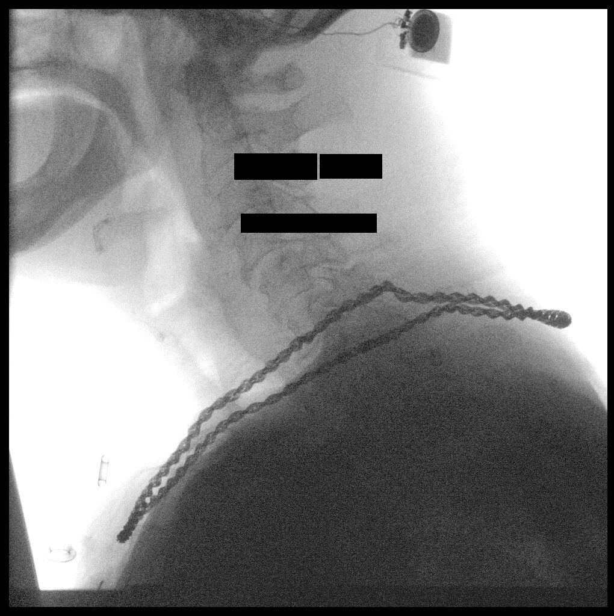

[Series 1: cp_standard · 0.09mm/px · 1 of 144 frames shown]
[frame 22/144]
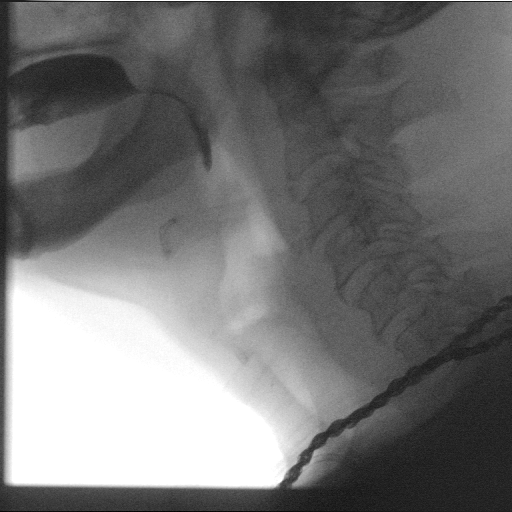

[Series 2: run · 2 of 16 frames shown (2 of 11)]
[frame 1/16]
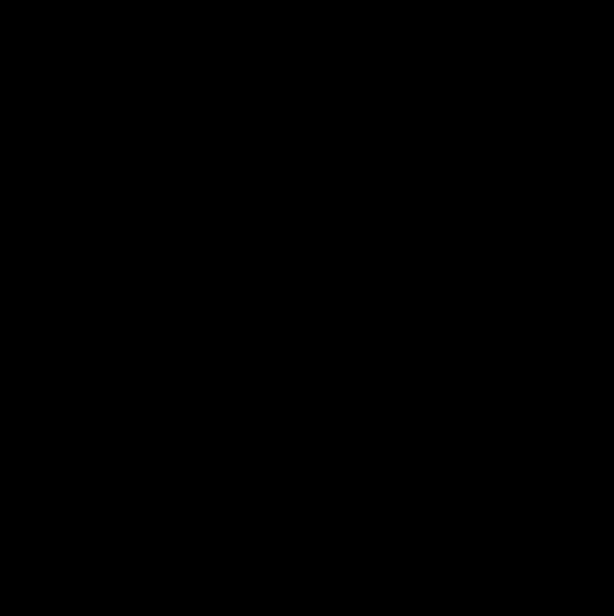
[frame 9/16]
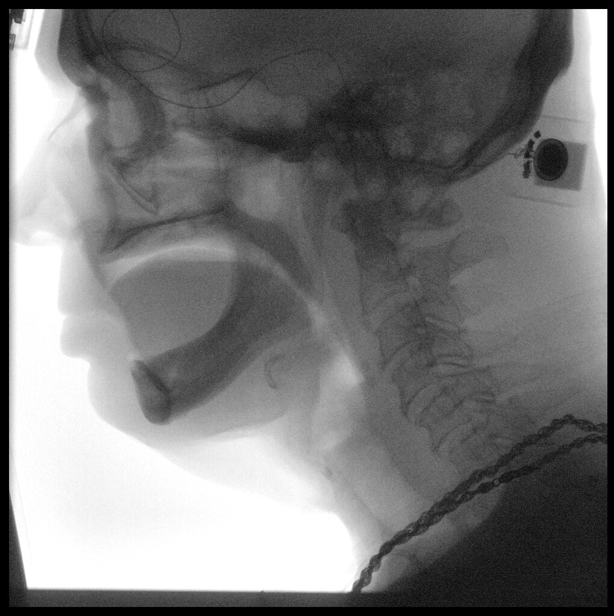

[Series 3: run · 1 of 16 frames shown (3 of 11)]
[frame 9/16]
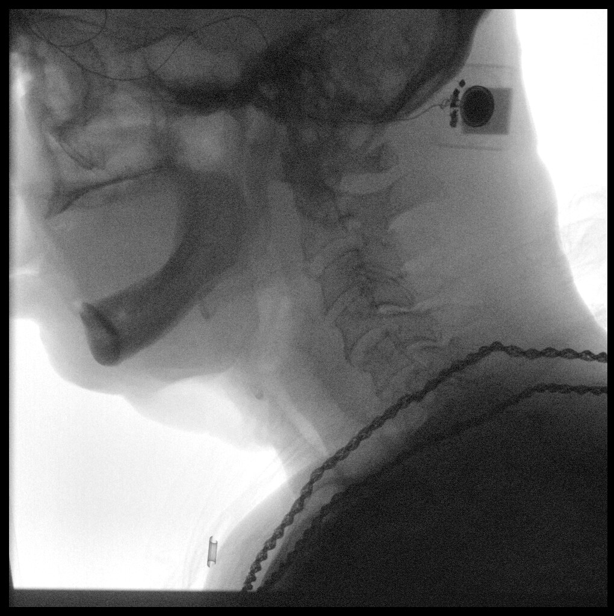

[Series 4: run · 1 of 22 frames shown (4 of 11)]
[frame 4/22]
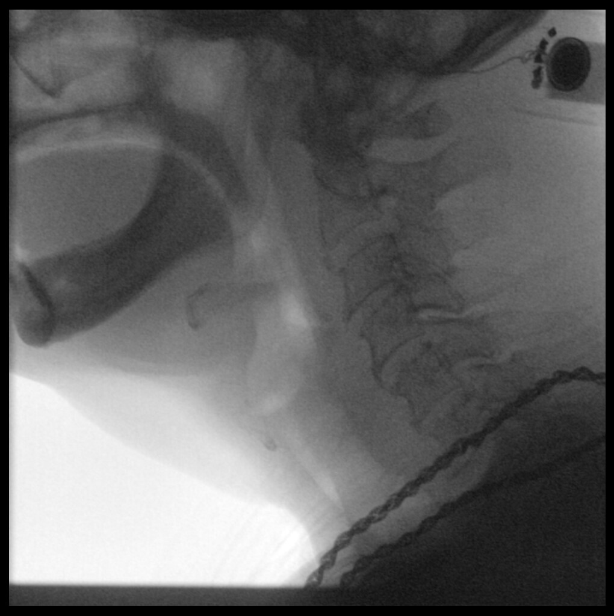

[Series 5: run · 1 of 15 frames shown (5 of 11)]
[frame 3/15]
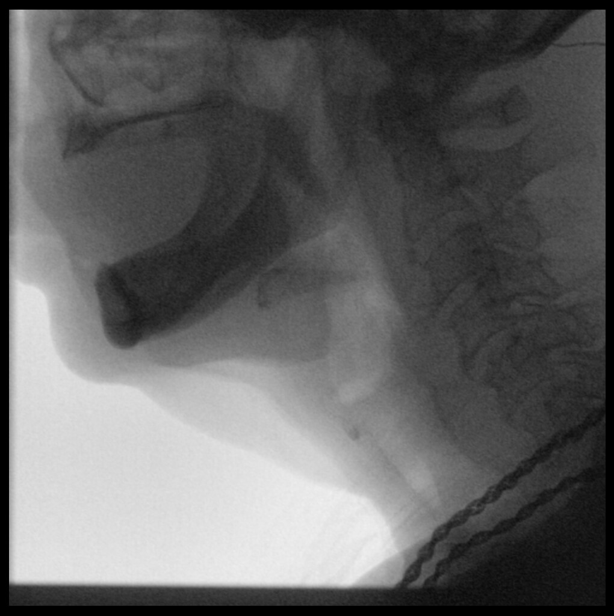

[Series 6: run · 2 of 149 frames shown (6 of 11)]
[frame 23/149]
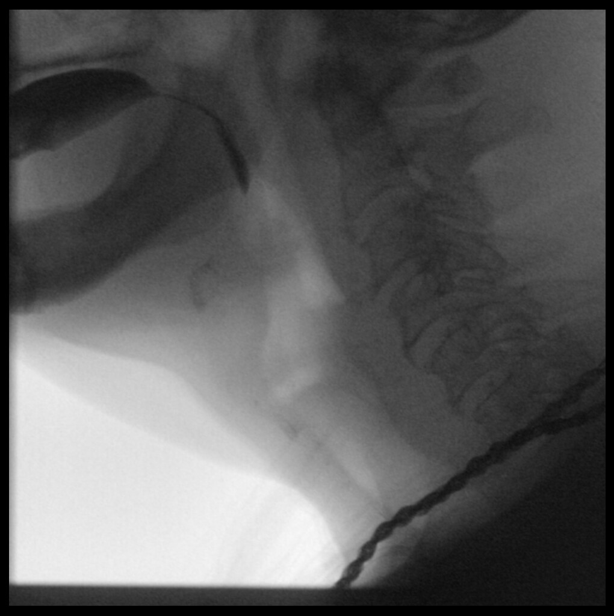
[frame 127/149]
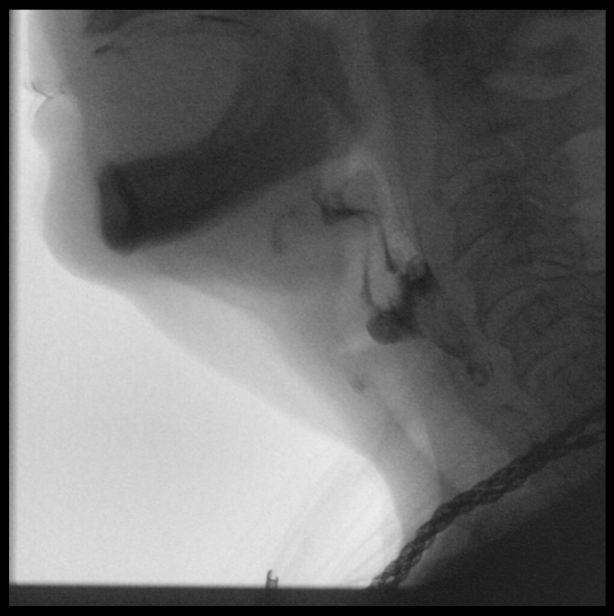

[Series 7: run · 1 of 58 frames shown (7 of 11)]
[frame 50/58]
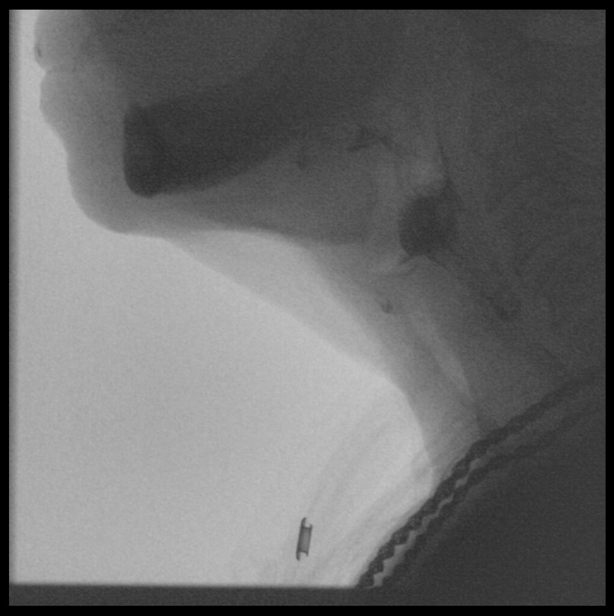

[Series 8: run · 1 of 166 frames shown (8 of 11)]
[frame 25/166]
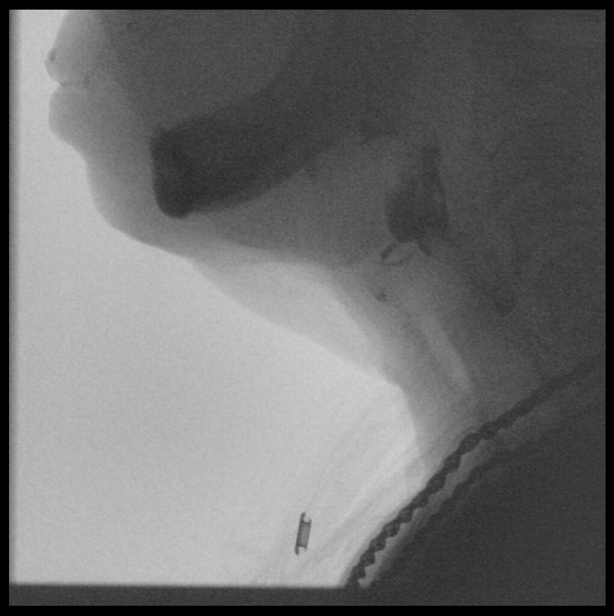

[Series 9: run · 1 of 447 frames shown (9 of 11)]
[frame 68/447]
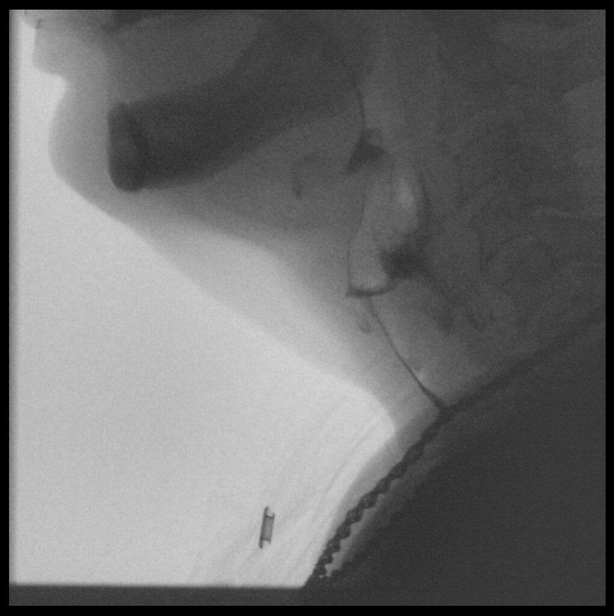

[Series 10: run · 2 of 43 frames shown (10 of 11)]
[frame 7/43]
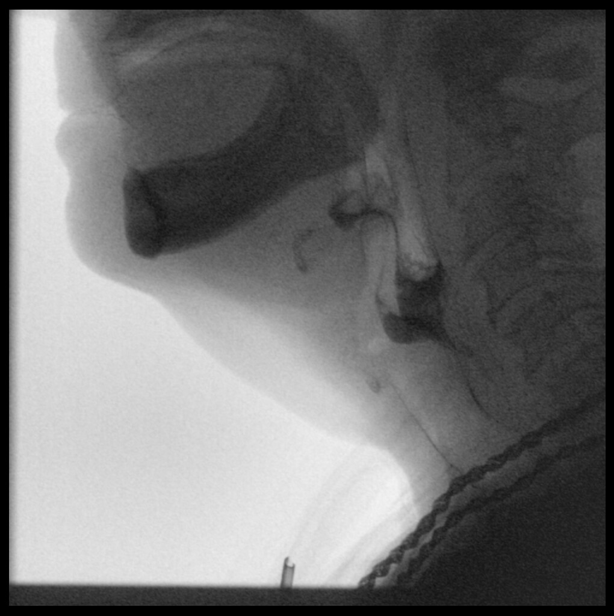
[frame 37/43]
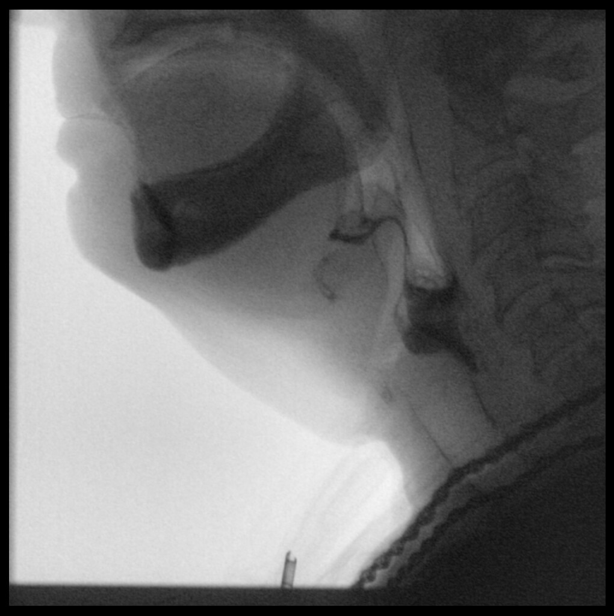

[Series 11: run · 1 of 50 frames shown (11 of 11)]
[frame 43/50]
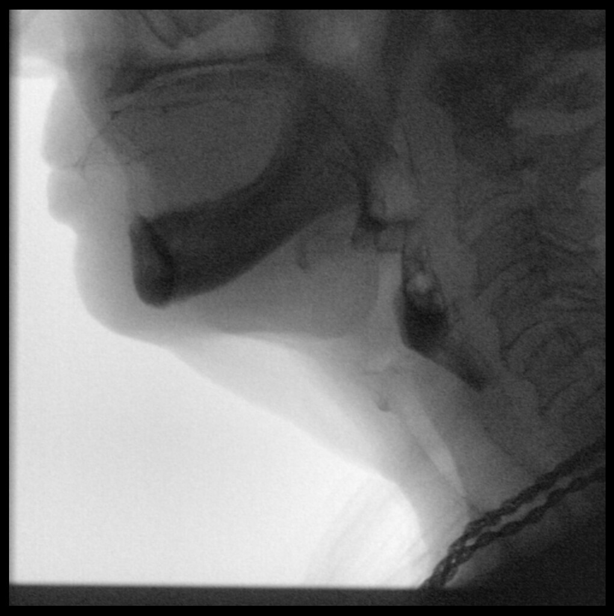

[15 of 24 positions shown; findings below may reference images not displayed]

FINDINGS: Real-time fluoroscopy of the swallowing function was performed with
a speech pathologist present.

Nectar thick consistency of barium was administered.

No significant pharyngeal peristalsis. Premature spillage with
significant vallecular residuals and in the piriform sinus.
Laryngeal penetration and silent tracheal aspiration. No significant
coughing.
IMPRESSION: Modified barium swallow as described above.

Please refer to the Speech Pathologists report for complete details
and recommendations.

## 2021-05-17 MED ORDER — GUAIFENESIN ER 600 MG PO TB12: 600.0000 mg | ORAL_TABLET | Freq: Two times a day (BID) | ORAL | Status: DC | PRN

## 2021-05-17 MED ORDER — LEVOTHYROXINE SODIUM 100 MCG/5ML IV SOLN: 12.5000 ug | Freq: Every day | INTRAVENOUS | Status: DC

## 2021-05-17 MED ORDER — LACTATED RINGERS IV SOLN: INTRAVENOUS | Status: DC

## 2021-05-17 MED ORDER — ACETAMINOPHEN 325 MG PO TABS
650.0000 mg | ORAL_TABLET | Freq: Four times a day (QID) | ORAL | Status: DC | PRN
  Filled 2021-05-17: qty 2

## 2021-05-17 MED ORDER — HYDRALAZINE HCL 20 MG/ML IJ SOLN
10.0000 mg | Freq: Three times a day (TID) | INTRAMUSCULAR | Status: DC
  Administered 2021-05-18: 10 mg via INTRAVENOUS
  Filled 2021-05-17 (×2): qty 1

## 2021-05-17 MED ORDER — SODIUM CHLORIDE 0.9 % IV SOLN
2.0000 g | INTRAVENOUS | Status: DC
  Administered 2021-05-18 – 2021-05-19 (×2): 2 g via INTRAVENOUS
  Filled 2021-05-17 (×2): qty 20

## 2021-05-17 MED ORDER — SODIUM CHLORIDE 0.9 % IV SOLN: 1.0000 g | INTRAVENOUS | Status: DC

## 2021-05-17 MED ORDER — METRONIDAZOLE 500 MG/100ML IV SOLN
500.0000 mg | Freq: Once | INTRAVENOUS | Status: AC
Start: 2021-05-17 — End: 2021-05-17
  Administered 2021-05-17: 500 mg via INTRAVENOUS
  Filled 2021-05-17: qty 100

## 2021-05-17 MED ORDER — SODIUM CHLORIDE 0.9 % IV BOLUS
1000.0000 mL | Freq: Once | INTRAVENOUS | Status: AC
  Administered 2021-05-17: 1000 mL via INTRAVENOUS

## 2021-05-17 MED ORDER — SODIUM CHLORIDE 0.9% FLUSH
3.0000 mL | Freq: Two times a day (BID) | INTRAVENOUS | Status: DC
  Administered 2021-05-17 – 2021-05-21 (×6): 3 mL via INTRAVENOUS

## 2021-05-17 MED ORDER — ALBUTEROL SULFATE (2.5 MG/3ML) 0.083% IN NEBU: 2.5000 mg | INHALATION_SOLUTION | RESPIRATORY_TRACT | Status: DC | PRN

## 2021-05-17 MED ORDER — ENOXAPARIN SODIUM 40 MG/0.4ML IJ SOSY
40.0000 mg | PREFILLED_SYRINGE | INTRAMUSCULAR | Status: DC
  Administered 2021-05-17 – 2021-05-18 (×2): 40 mg via SUBCUTANEOUS
  Filled 2021-05-17: qty 0.4

## 2021-05-17 MED ORDER — ONDANSETRON HCL 4 MG/2ML IJ SOLN: 4.0000 mg | Freq: Four times a day (QID) | INTRAMUSCULAR | Status: DC | PRN

## 2021-05-17 MED ORDER — SODIUM CHLORIDE 0.9 % IV SOLN
2.0000 g | Freq: Once | INTRAVENOUS | Status: AC
  Administered 2021-05-17: 2 g via INTRAVENOUS
  Filled 2021-05-17: qty 20

## 2021-05-17 MED ORDER — METRONIDAZOLE 500 MG/100ML IV SOLN
500.0000 mg | Freq: Two times a day (BID) | INTRAVENOUS | Status: DC
  Administered 2021-05-18 – 2021-05-19 (×3): 500 mg via INTRAVENOUS
  Filled 2021-05-17 (×4): qty 100

## 2021-05-17 MED ORDER — HYDRALAZINE HCL 20 MG/ML IJ SOLN: 5.0000 mg | INTRAMUSCULAR | Status: DC | PRN

## 2021-05-17 MED ORDER — ACETAMINOPHEN 650 MG RE SUPP: 650.0000 mg | Freq: Four times a day (QID) | RECTAL | Status: DC | PRN

## 2021-05-17 MED ORDER — ONDANSETRON HCL 4 MG PO TABS: 4.0000 mg | ORAL_TABLET | Freq: Four times a day (QID) | ORAL | Status: DC | PRN

## 2021-05-17 MED ORDER — MORPHINE SULFATE (PF) 2 MG/ML IV SOLN
2.0000 mg | INTRAVENOUS | Status: DC | PRN
  Administered 2021-05-18 – 2021-05-20 (×6): 2 mg via INTRAVENOUS
  Filled 2021-05-17 (×6): qty 1

## 2021-05-17 MED ORDER — FLUTICASONE PROPIONATE 50 MCG/ACT NA SUSP
1.0000 | Freq: Every day | NASAL | Status: DC
  Administered 2021-05-18 – 2021-05-20 (×3): 1 via NASAL
  Filled 2021-05-17: qty 16

## 2021-05-17 MED FILL — Fosaprepitant Dimeglumine For IV Infusion 150 MG (Base Eq): INTRAVENOUS | Qty: 5 | Status: AC

## 2021-05-17 MED FILL — Dexamethasone Sodium Phosphate Inj 100 MG/10ML: INTRAMUSCULAR | Qty: 1 | Status: AC

## 2021-05-17 NOTE — ED Notes (Addendum)
Pt to ED from cancer center after swallow screen was done and needs further examination. Pt denies any SOB,CP. Pt able to follow commands appropriately. Pt has hx of CVA, left sided weakness present. Pt currently undergoing chemotherapy for tongue cancer  ?

## 2021-05-17 NOTE — H&P (Signed)
?History and Physical  ? ? ?Patient: Kent [name] WGY:[id number] DOB: Dec 02, 1955 ?DOA: 05/17/2021 ?DOS: the patient was seen and examined on 05/18/2021 ?PCP: Pcp, No  ?Patient coming from: Home ? ?Chief Complaint:  ?Chief Complaint  ?Patient presents with  ? Aspiration  ? ?HPI: Kent [name] is a 66 y.o. 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  Patient is asymptomatic and does not report any headaches blurred vision speech difficulty swallowing difficulty chest pain palpitations fevers chills abdominal pain any bladder or bowel complaints or any shortness of breath or dyspnea on exertion or any complaints otherwise. Pt is poor historian otherwise. ? ?Review of Systems: Review of Systems  ?Unable to perform ROS: Other (poor historian.)  ? ?Past Medical History:  ?Diagnosis Date  ? Arthritis   ? Diabetes mellitus without complication ([place])   ? Generalized weakness 06/22/2019  ? Hyperlipidemia   ? Hypertension   ? Hypertensive urgency 06/22/2019  ? Obesity   ? Osteomyelitis ([place])   ? From pt chart  ? Renal insufficiency   ? CKD noted on chart  ? Staph infection 05/29/2016  ? Nov 2017. Staph infection in LT shoulder requiring debridement and grafts  ? Status post debridement   ? Stomach cancer ([place])   ? had tumor removed- [facility]  ? Tongue cancer ([place])   ? 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 ?Past Surgical History:  ?Procedure Laterality Date  ? EXCISION OF TONGUE LESION Left 06/19/2016  ? Procedure: EXCISION OF TONGUE LESION;  Surgeon: Margaretha Sheffield, MD;  Location: [facility];  Service: ENT;  Laterality: Left;  diabetic - insulin and oral meds - not using either currently  ? gastric tumor removed  2010  ? I & D EXTREMITY Left 01/09/2016   ? Procedure: IRRIGATION AND DEBRIDEMENT EXTREMITY;  Surgeon: Robert Bellow, MD;  Location: [facility];  Service: General;  Laterality: Left;  ? LARYNGOSCOPY N/A 06/19/2016  ? Procedure: LARYNGOSCOPY;  Surgeon: Margaretha Sheffield, MD;  Location: [facility];  Service: ENT;  Laterality: N/A;  ? PERIPHERAL VASCULAR CATHETERIZATION Right 12/31/2015  ? Procedure: Lower Extremity Angiography;  Surgeon: Algernon Huxley, MD;  Location: [facility];  Service: Cardiovascular;  Laterality: Right;  ? PORTA CATH INSERTION N/A 03/07/2021  ? Procedure: PORTA CATH INSERTION;  Surgeon: Algernon Huxley, MD;  Location: [facility];  Service: Cardiovascular;  Laterality: N/A;  ? tongue mass biopsy    ? ?Social History:  reports that he has never smoked. He has never used smokeless tobacco. He reports that he does not drink alcohol and does not use drugs. ? ?No Known Allergies ? ?Family History  ?Problem Relation Age of Onset  ? Heart disease Mother   ? Hypertension Father   ? Cancer Sister   ?     stomach  ? Heart disease Brother   ?     MI  ? Cancer Daughter   ? Cancer Son   ?     testicular  ? ADD / ADHD Sister   ? ? ?Prior to Admission medications   ?Medication Sig Start Date End Date Taking? Authorizing Provider  ?amLODipine (NORVASC) 10 MG tablet Take 10 mg by mouth daily. 09/29/19   [provider]  ?amoxicillin-clavulanate (AUGMENTIN) 875-125 MG tablet Take 1 tablet by mouth 2 (two) times daily. 05/09/21   Borders, Kirt Boys, NP  ?aspirin EC 81 MG EC tablet Take 1 tablet (81 mg total) by mouth daily. 07/06/19   Enzo Bi, MD  ?atorvastatin (LIPITOR) 40 MG tablet Take 1 tablet (40 mg total) by mouth daily. 07/06/19   Enzo Bi, MD  ?calcium-vitamin D Darron Doom WITH D) 500-5 MG-MCG tablet Take 1 tablet by mouth daily with breakfast. 03/18/21   Borders, Kirt Boys, NP  ?dexamethasone (DECADRON) 4 MG tablet Take 2 tablets (8 mg total) by mouth daily. Start the day after carboplatin chemotherapy for 3 days. ?Patient not  taking: Reported on 05/09/2021 03/04/21   Sindy Guadeloupe, MD  ?[facility] ALLERGY RELIEF 50 MCG/ACT nasal spray SMARTSIG:2 Spray(s) Both Nares Daily PRN ?Patient not taking: Reported on 05/09/2021 04/03/21   [provider]  ?LANTUS SOLOSTAR 100 UNIT/ML Solostar Pen Inject 18 Units into the skin at bedtime. 09/29/19   [provider]  ?levothyroxine (SYNTHROID) 25 MCG tablet Take 25 mcg by mouth daily. 09/30/19   [provider]  ?lidocaine-prilocaine (EMLA) cream Apply to affected area once 03/04/21   Sindy Guadeloupe, MD  ?LORazepam (ATIVAN) 0.5 MG tablet Take 1 tablet (0.5 mg total) by mouth every 6 (six) hours as needed (Nausea or vomiting). 03/04/21   Sindy Guadeloupe, MD  ?losartan (COZAAR) 50 MG tablet Take 50 mg by mouth daily.    [provider]  ?magic mouthwash w/lidocaine SOLN Take 5 mLs by mouth 4 (four) times daily as needed for mouth pain. ?Patient not taking: Reported on 05/09/2021 04/01/21   Sindy Guadeloupe, MD  ?ondansetron (ZOFRAN) 8 MG tablet Take 1 tablet (8 mg total) by mouth 2 (two) times daily as needed for refractory nausea / vomiting. Start on day 3 after carboplatin chemo. ?Patient not taking: Reported on 05/09/2021 03/04/21   Sindy Guadeloupe, MD  ?oxyCODONE (OXY IR/ROXICODONE) 5 MG immediate release tablet Take 1 tablet (5 mg total) by mouth every 6 (six) hours as needed for severe pain. 04/29/21   Sindy Guadeloupe, MD  ?polyethylene glycol (MIRALAX / GLYCOLAX) 17 g packet Take 17 g by mouth 2 (two) times daily. ?Patient taking differently: Take 17 g by mouth daily as needed. 07/05/19   Enzo Bi, MD  ?potassium chloride 20 MEQ/15ML (10%) SOLN TAKE 15 MLS BY MOUTH DAILY. 04/02/21   Sindy Guadeloupe, MD  ?prochlorperazine (COMPAZINE) 10 MG tablet Take 1 tablet (10 mg total) by mouth every 6 (six) hours as needed (Nausea or vomiting). ?Patient not taking: Reported on 05/09/2021 03/04/21   Sindy Guadeloupe, MD  ? ? ?Physical Exam: ?Vitals:  ? 05/17/21 2000 05/17/21 2242 05/17/21 2249 05/17/21 2313   ?BP: (!) 150/114 124/84 (!) 130/97 (!) 130/92  ?Pulse: 80 67 84 86  ?Resp: 20 20 18 16   ?Temp:   98.1 ?F (36.7 ?C) 97.8 ?F (36.6 ?C)  ?TempSrc:   Oral Oral  ?SpO2: 96% 93% 94% 95%  ?Weight:    64.7 kg  ?Height:    5\' 7"  (1.702 m)  ?Physical Exam ?Vitals and nursing note reviewed.  ?Constitutional:   ?   General: He is not in acute distress. ?   Appearance: Normal appearance. He is not ill-appearing, toxic-appearing or diaphoretic.  ?HENT:  ?   Head: Normocephalic and atraumatic.  ?   Right Ear: Hearing and external ear normal.  ?   Left Ear: Hearing and  external ear normal.  ?   Nose: Nose normal. No nasal deformity.  ?   Mouth/Throat:  ?   Lips: Pink.  ?   Mouth: Mucous membranes are moist.  ?   Tongue: No lesions.  ?   Pharynx: Oropharynx is clear.  ?Eyes:  ?   Extraocular Movements: Extraocular movements intact.  ?   Pupils: Pupils are equal, round, and reactive to light.  ?Neck:  ?   Vascular: No carotid bruit.  ?Cardiovascular:  ?   Rate and Rhythm: Normal rate and regular rhythm.  ?   Pulses: Normal pulses.  ?   Heart sounds: Normal heart sounds.  ?Pulmonary:  ?   Effort: Pulmonary effort is normal.  ?   Breath sounds: Wheezing present.  ?Abdominal:  ?   General: Bowel sounds are normal. There is no distension.  ?   Palpations: Abdomen is soft. There is no mass.  ?   Tenderness: There is no abdominal tenderness. There is no guarding.  ?   Hernia: No hernia is present.  ?Musculoskeletal:  ?   Right lower leg: No edema.  ?   Left lower leg: No edema.  ?Skin: ?   General: Skin is warm.  ?Neurological:  ?   General: No focal deficit present.  ?   Mental Status: He is alert and oriented to person, place, and time.  ?   Cranial Nerves: Cranial nerves 2-12 are intact.  ?   Motor: Motor function is intact.  ?Psychiatric:     ?   Attention and Perception: Attention normal.     ?   Mood and Affect: Mood normal.     ?   Speech: Speech normal.     ?   Behavior: Behavior is cooperative.     ?   Cognition and Memory:  Cognition normal.  ? ? ?Data Reviewed: ?Results for orders placed or performed during the hospital encounter of 05/17/21 (from the past 24 hour(s))  ?CBC     Status: Abnormal  ? Collection Time: 05/17/21  3:44

## 2021-05-17 NOTE — Progress Notes (Signed)
Modified Barium Swallow Progress Note ? ?Patient Details  ?Name: Kent [name] ?MRN: [id number] ?Date of Birth: [birth year] ? ?Today's Date: 05/17/2021 ? ?Modified Barium Swallow completed.  Full report located under Chart Review in the Imaging Section. ? ?Brief recommendations include the following: ? ?Clinical Impression ? Pt presents with profound silent aspiration of nectar thick liquids with inability to clear pharyngeal residue with subsequent swallows, thus study was discontinued by Radiologist and SLP. Pt presents with moderate oral phase and severe pharyngeal phase dysphagia. Orally, pt has difficulty with bolus cohesion which results in bolus falling over the base of tongue and into the pyriform sinuses. Pt with little pharyngeal peristalsis that leaves the majority of bolus within the pyriform sinuses and is subsequently silently aspirated. At this time, pt is at a high risk of developing aspiration pneumonia and safest recommendation would be NPO. Education with video feedback provided for pt and his cousin. I also spoke with pt's sister via phone as well as Altha Harm (NP, cancer center). Pt taken to ED for evaluation and discussion of goals of care regarding alternative means of nutrition. ?  ?Swallow Evaluation Recommendations ? ?   ? ? SLP Diet Recommendations: NPO ? ?   ? ? Medication Administration: Via alternative means ? ?   ? ?   ? ?   ? ? Oral Care Recommendations: Oral care QID ? ?   ? ? ?Tyshawna Alarid B. Rutherford Nail, M.S., CCC-SLP, CBIS ?Speech-Language Pathologist ?Rehabilitation Services ?Office [phone] ? ?Kent [name] ?05/17/2021,4:08 PM ?

## 2021-05-17 NOTE — ED Provider Notes (Signed)
? ?[facility] ?Provider Note ? ? ? Event Date/Time  ? First MD Initiated Contact with Patient 05/17/21 1713   ?  (approximate) ? ? ?History  ? ?Aspiration ? ? ?HPI ? ?Kent [name] is a 66 y.o. male with a history of carcinoma of base of tongue, severe malnutrition, diabetes, CHF who is sent to the ED after a swallow study that was done today which showed severe aspiration due to dysphagia.  He was recommended to be n.p.o., and when this was discussed with his cancer center team, they recommended he come to the ED for evaluation, IV fluids and hospitalization for further management and consideration of goals of care. ? ?Patient does report having productive cough for several weeks.  He has been on Augmentin for the past week. ?  ? ? ?Physical Exam  ? ?Triage Vital Signs: ?ED Triage Vitals  ?Enc Vitals Group  ?   BP 05/17/21 1541 103/79  ?   Pulse Rate 05/17/21 1541 (!) 101  ?   Resp 05/17/21 1541 16  ?   Temp 05/17/21 1541 98.7 ?F (37.1 ?C)  ?   Temp Source 05/17/21 1541 Oral  ?   SpO2 05/17/21 1541 94 %  ?   Weight 05/17/21 1542 142 lb 13.7 oz (64.8 kg)  ?   Height 05/17/21 1542 5\' 7"  (1.702 m)  ?   Head Circumference --   ?   Peak Flow --   ?   Pain Score 05/17/21 1542 0  ?   Pain Loc --   ?   Pain Edu? --   ?   Excl. in [place]? --   ? ? ?Most recent vital signs: ?Vitals:  ? 05/17/21 2249 05/17/21 2313  ?BP: (!) 130/97 (!) 130/92  ?Pulse: 84 86  ?Resp: 18 16  ?Temp: 98.1 ?F (36.7 ?C) 97.8 ?F (36.6 ?C)  ?SpO2: 94% 95%  ? ? ? ?General: Awake, no distress.  ?CV:  Good peripheral perfusion.  Regular rate and rhythm ?Resp:  Normal effort.  Diffuse expiratory crackles ?Abd:  No distention.  Soft and nontender ?Other:  No rash.  Moist oral mucosa, no lower extremity edema ? ? ?ED Results / Procedures / Treatments  ? ?Labs ?(all labs ordered are listed, but only abnormal results are displayed) ?Labs Reviewed  ?CBC - Abnormal; Notable for the following components:  ?    Result Value  ? RBC 3.60 (*)   ?  Hemoglobin 10.0 (*)   ? HCT 31.9 (*)   ? RDW 17.1 (*)   ? All other components within normal limits  ?BASIC METABOLIC PANEL - Abnormal; Notable for the following components:  ? Chloride 97 (*)   ? Calcium 8.4 (*)   ? All other components within normal limits  ?RESP PANEL BY RT-PCR (FLU A&B, COVID) ARPGX2  ?T4, FREE  ?CBC  ?COMPREHENSIVE METABOLIC PANEL  ?HIV ANTIBODY (ROUTINE TESTING W REFLEX)  ? ? ? ?EKG ? ? ? ? ?RADIOLOGY ?Chest x-ray viewed and interpreted by me, appears unremarkable.  Radiology report reviewed. ? ?CT chest shows multifocal infiltrates consistent with aspiration pneumonia, appears somewhat improved compared to recent CT 2 weeks ago. ? ? ? ?PROCEDURES: ? ?Critical Care performed: No ? ?Procedures ? ? ?MEDICATIONS ORDERED IN ED: ?Medications  ?lactated ringers infusion ( Intravenous New Bag/Given 05/17/21 2328)  ?metroNIDAZOLE (FLAGYL) IVPB 500 mg (has no administration in time range)  ?hydrALAZINE (APRESOLINE) injection 10 mg (10 mg Intravenous Not Given 05/17/21 2329)  ?  fluticasone (FLONASE) 50 MCG/ACT nasal spray 1 spray (has no administration in time range)  ?levothyroxine (SYNTHROID, LEVOTHROID) injection 12.5 mcg (has no administration in time range)  ?enoxaparin (LOVENOX) injection 40 mg (40 mg Subcutaneous Given 05/17/21 2332)  ?acetaminophen (TYLENOL) tablet 650 mg (has no administration in time range)  ?  Or  ?acetaminophen (TYLENOL) suppository 650 mg (has no administration in time range)  ?morphine (PF) 2 MG/ML injection 2 mg (has no administration in time range)  ?ondansetron (ZOFRAN) injection 4 mg (has no administration in time range)  ?albuterol (PROVENTIL) (2.5 MG/3ML) 0.083% nebulizer solution 2.5 mg (has no administration in time range)  ?hydrALAZINE (APRESOLINE) injection 5 mg (has no administration in time range)  ?cefTRIAXone (ROCEPHIN) 2 g in sodium chloride 0.9 % 100 mL IVPB (has no administration in time range)  ?sodium chloride flush (NS) 0.9 % injection 3 mL (3 mLs  Intravenous Given 05/17/21 2329)  ?cefTRIAXone (ROCEPHIN) 2 g in sodium chloride 0.9 % 100 mL IVPB (0 g Intravenous Stopped 05/17/21 2200)  ?metroNIDAZOLE (FLAGYL) IVPB 500 mg (0 mg Intravenous Stopped 05/17/21 2200)  ?sodium chloride 0.9 % bolus 1,000 mL (0 mLs Intravenous Stopped 05/17/21 2200)  ? ? ? ?IMPRESSION / MDM / ASSESSMENT AND PLAN / ED COURSE  ?I reviewed the triage vital signs and the nursing notes. ?             ?               ? ?Patient sent to the ED due to severe aspiration with any oral intake, requiring him to be n.p.o.  He needs IV fluids for hydration, hospitalization to consider whether or not he would want a PEG tube for continued nutrition. ? ?No evidence of ACS PE dissection, not septic.  COVID and flu negative.  Case discussed with hospitalist. ? ? ? ?  ? ? ?FINAL CLINICAL IMPRESSION(S) / ED DIAGNOSES  ? ?Final diagnoses:  ?Aspiration pneumonia of both lungs, unspecified aspiration pneumonia type, unspecified part of lung ([place])  ?Carcinoma of base of tongue ([place])  ? ? ? ?Rx / DC Orders  ? ?ED Discharge Orders   ? ? None  ? ?  ? ? ? ?Note:  This document was prepared using Dragon voice recognition software and may include unintentional dictation errors. ?  ?Carrie Mew, MD ?05/17/21 2358 ? ?

## 2021-05-17 NOTE — ED Triage Notes (Signed)
Pt sent to ED after swallowing study done today. Exam showed that he aspirated. Pt sent over to ED for further evaluation. Pt denies SOB, CP, N/V/D. Pt currently on chemotherapy for carcinoma of tongue. Pt with hx CVA, CHF and DM.  ?

## 2021-05-18 ENCOUNTER — Encounter: Payer: Self-pay | Admitting: Internal Medicine

## 2021-05-18 DIAGNOSIS — Z8673 Personal history of transient ischemic attack (TIA), and cerebral infarction without residual deficits: Secondary | ICD-10-CM | POA: Diagnosis not present

## 2021-05-18 DIAGNOSIS — E43 Unspecified severe protein-calorie malnutrition: Secondary | ICD-10-CM | POA: Diagnosis present

## 2021-05-18 DIAGNOSIS — Z6822 Body mass index (BMI) 22.0-22.9, adult: Secondary | ICD-10-CM | POA: Diagnosis not present

## 2021-05-18 DIAGNOSIS — Z515 Encounter for palliative care: Secondary | ICD-10-CM | POA: Diagnosis not present

## 2021-05-18 DIAGNOSIS — C01 Malignant neoplasm of base of tongue: Secondary | ICD-10-CM | POA: Diagnosis not present

## 2021-05-18 DIAGNOSIS — Z79899 Other long term (current) drug therapy: Secondary | ICD-10-CM | POA: Diagnosis not present

## 2021-05-18 DIAGNOSIS — Z794 Long term (current) use of insulin: Secondary | ICD-10-CM | POA: Diagnosis not present

## 2021-05-18 DIAGNOSIS — Z20822 Contact with and (suspected) exposure to covid-19: Secondary | ICD-10-CM | POA: Diagnosis present

## 2021-05-18 DIAGNOSIS — Z7982 Long term (current) use of aspirin: Secondary | ICD-10-CM | POA: Diagnosis not present

## 2021-05-18 DIAGNOSIS — Z809 Family history of malignant neoplasm, unspecified: Secondary | ICD-10-CM | POA: Diagnosis not present

## 2021-05-18 DIAGNOSIS — Z85028 Personal history of other malignant neoplasm of stomach: Secondary | ICD-10-CM | POA: Diagnosis not present

## 2021-05-18 DIAGNOSIS — I5032 Chronic diastolic (congestive) heart failure: Secondary | ICD-10-CM | POA: Diagnosis present

## 2021-05-18 DIAGNOSIS — E119 Type 2 diabetes mellitus without complications: Secondary | ICD-10-CM | POA: Diagnosis present

## 2021-05-18 DIAGNOSIS — D649 Anemia, unspecified: Secondary | ICD-10-CM | POA: Diagnosis present

## 2021-05-18 DIAGNOSIS — Z66 Do not resuscitate: Secondary | ICD-10-CM | POA: Diagnosis not present

## 2021-05-18 DIAGNOSIS — E785 Hyperlipidemia, unspecified: Secondary | ICD-10-CM | POA: Diagnosis present

## 2021-05-18 DIAGNOSIS — J690 Pneumonitis due to inhalation of food and vomit: Secondary | ICD-10-CM | POA: Diagnosis not present

## 2021-05-18 DIAGNOSIS — Z8249 Family history of ischemic heart disease and other diseases of the circulatory system: Secondary | ICD-10-CM | POA: Diagnosis not present

## 2021-05-18 DIAGNOSIS — I110 Hypertensive heart disease with heart failure: Secondary | ICD-10-CM | POA: Diagnosis present

## 2021-05-18 LAB — COMPREHENSIVE METABOLIC PANEL
ALT: 13 U/L (ref 0–44)
AST: 18 U/L (ref 15–41)
Albumin: 2.6 g/dL — ABNORMAL LOW (ref 3.5–5.0)
Alkaline Phosphatase: 86 U/L (ref 38–126)
Anion gap: 4 — ABNORMAL LOW (ref 5–15)
BUN: 8 mg/dL (ref 8–23)
CO2: 32 mmol/L (ref 22–32)
Calcium: 8.3 mg/dL — ABNORMAL LOW (ref 8.9–10.3)
Chloride: 102 mmol/L (ref 98–111)
Creatinine, Ser: 0.55 mg/dL — ABNORMAL LOW (ref 0.61–1.24)
GFR, Estimated: >60 mL/min (ref >60)
Glucose, Bld: 77 mg/dL (ref 70–99)
Potassium: 3.6 mmol/L (ref 3.5–5.1)
Sodium: 138 mmol/L (ref 135–145)
Total Bilirubin: 0.4 mg/dL (ref 0.3–1.2)
Total Protein: 6.3 g/dL — ABNORMAL LOW (ref 6.5–8.1)

## 2021-05-18 LAB — CBC
HCT: 31.1 % — ABNORMAL LOW (ref 39.0–52.0)
Hemoglobin: 9.9 g/dL — ABNORMAL LOW (ref 13.0–17.0)
MCH: 28 pg (ref 26.0–34.0)
MCHC: 31.8 g/dL (ref 30.0–36.0)
MCV: 87.9 fL (ref 80.0–100.0)
Platelets: 194 10*3/uL (ref 150–400)
RBC: 3.54 MIL/uL — ABNORMAL LOW (ref 4.22–5.81)
RDW: 17 % — ABNORMAL HIGH (ref 11.5–15.5)
WBC: 8.2 10*3/uL (ref 4.0–10.5)
nRBC: 0 % (ref 0.0–0.2)

## 2021-05-18 LAB — HIV ANTIBODY (ROUTINE TESTING W REFLEX): HIV Screen 4th Generation wRfx: NONREACTIVE

## 2021-05-18 MED ORDER — POTASSIUM CHLORIDE 2 MEQ/ML IV SOLN: INTRAVENOUS | Status: DC

## 2021-05-18 MED ORDER — CHLORHEXIDINE GLUCONATE 0.12 % MT SOLN
15.0000 mL | Freq: Two times a day (BID) | OROMUCOSAL | Status: DC
  Administered 2021-05-18 – 2021-05-21 (×6): 15 mL via OROMUCOSAL
  Filled 2021-05-18 (×5): qty 15

## 2021-05-18 MED ORDER — ORAL CARE MOUTH RINSE
15.0000 mL | Freq: Two times a day (BID) | OROMUCOSAL | Status: DC
  Administered 2021-05-19 – 2021-05-21 (×6): 15 mL via OROMUCOSAL

## 2021-05-18 MED ORDER — KCL-LACTATED RINGERS-D5W 20 MEQ/L IV SOLN
INTRAVENOUS | Status: DC
  Filled 2021-05-18 (×4): qty 1000

## 2021-05-18 NOTE — Assessment & Plan Note (Addendum)
hold pt on hydralazine and Lipitor and cozaar and aspirin  Due to aspiration.  ?No anginal symptoms.  ? ?

## 2021-05-18 NOTE — Assessment & Plan Note (Signed)
Blood pressure (!) 130/92, pulse 86, temperature 97.8 ?F (36.6 ?C), temperature source Oral, resp. rate 16, height 5\' 7"  (1.702 m), weight 64.7 kg, SpO2 95 %. ?due to aspiration we will change to iv meds,  ?Scheduled hydralazine.  ? ?

## 2021-05-18 NOTE — Assessment & Plan Note (Signed)
Oncology consult ordered placed as edmd had contacted onc md and is going to follow inpatient.  ?

## 2021-05-18 NOTE — Progress Notes (Signed)
Patient frequently/multiple times getting up to use urinal without calling despite call bell/fall education. Placing external cath to hopefully avoid patient getting up and falling. ?

## 2021-05-18 NOTE — Evaluation (Addendum)
Occupational Therapy Evaluation ?Patient Details ?Name: Kent [name] ?MRN: [id number] ?DOB: 08-Dec-1955 ?Today's Date: 05/18/2021 ? ? ?History of Present Illness pt is a 66 year old male presenting to ED after an abnormal swallow study and found to have bilateral pneumonia. 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 ?Clinical Impression ?  ?Chart reviewed, nuse cleared pt for participation in OT evaluation. Pt is alert and oriented x4, dysarthric, agreeable to session. Pt reports he lives with his sister and a family member (nephews/his sister) are available at all times to assist with ADL/IADL as needed. Mild impulsivity noted throughout with vcs required for safe RW use. Pt presents with baseline balance and LUE coordination/strength/dexteirty deficits. Pt reports acute generalized weakness and appears to be performing ADL/functional mobility below PLOF. Recommend HHOT at this time to address functional deficits and to facilitate safe ADL completion following discharge. Pt is left in bedside chair, NAD, all needs met. OT will follow acutely.  ?   ? ?Recommendations for follow up therapy are one component of a multi-disciplinary discharge planning process, led by the attending physician.  Recommendations may be updated based on patient status, additional functional criteria and insurance authorization.  ? ?Follow Up Recommendations ? Home health OT  ?  ?Assistance Recommended at Discharge Intermittent Supervision/Assistance  ?Patient can return home with the following A little help with walking and/or transfers;A little help with bathing/dressing/bathroom;Assistance with cooking/housework;Direct supervision/assist for medications management;Assistance with feeding;Assist for transportation;Help with stairs or ramp for entrance ? ?  ?Functional Status  Assessment ? Patient has had a recent decline in their functional status and demonstrates the ability to make significant improvements in function in a reasonable and predictable amount of time.  ?Equipment Recommendations ? None recommended by OT;Other (comment) (pt has recommended equipment)  ?  ?Recommendations for Other Services   ? ? ?  ?Precautions / Restrictions Precautions ?Precautions: Fall ?Precaution Comments: left sided residual weakness from previous stroke ?Restrictions ?Weight Bearing Restrictions: No  ? ?  ? ?Mobility Bed Mobility ?Overal bed mobility: Needs Assistance ?Bed Mobility: Supine to Sit ?  ?  ?Supine to sit: Modified independent (Device/Increase time), HOB elevated ?  ?  ?  ?  ? ?Transfers ?Overall transfer level: Needs assistance ?Equipment used: Rolling walker (2 wheels) ?Transfers: Sit to/from Stand ?Sit to Stand: Min guard ?  ?  ?  ?  ?  ?  ?  ? ?  ?Balance Overall balance assessment: Mild deficits observed, not formally tested ?  ?  ?  ?  ?  ?  ?  ?  ?  ?  ?  ?  ?  ?  ?  ?  ?  ?  ?   ? ?ADL either performed or assessed with clinical judgement  ? ?ADL Overall ADL's : Needs assistance/impaired ?  ?  ?  ?  ?  ?  ?  ?  ?  ?  ?  ?  ?  ?  ?  ?  ?  ?  ?  ?General ADL Comments: CGA at sink level with RW for grooming tasks (washing face, washing hands), MIN A for UB dressing seated on edge of bed, CGA-MIN A with RW, frequent vcs for appropriate RW use simulated household distances around room  ? ? ? ?Vision Patient Visual Report: No change from baseline ?   ?   ?  Perception   ?  ?Praxis   ?  ? ?Pertinent Vitals/Pain Pain Assessment ?Pain Assessment: No/denies pain  ? ? ? ?Hand Dominance Left ?  ?Extremity/Trunk Assessment Upper Extremity Assessment ?Upper Extremity Assessment: LUE deficits/detail;Generalized weakness;RUE deficits/detail ?RUE Deficits / Details: RUE MMT 4-/5 throughout, mildly weak grip strength ?LUE Deficits / Details: AROM: L shoulder flexion to approx 80 degrees, elbow 3/4  full ROM, wrist 1/2 full ROM, residual weakness throughout; pt is L hand dominant ?LUE Sensation: decreased light touch (baseline) ?LUE Coordination: decreased fine motor;decreased gross motor ?  ?Lower Extremity Assessment ?Lower Extremity Assessment: LLE deficits/detail ?  ?  ?  ?Communication Communication ?Communication: Expressive difficulties;Other (comment) (dysarthric) ?  ?Cognition Arousal/Alertness: Awake/alert ?Behavior During Therapy: [facility] for tasks assessed/performed ?Overall Cognitive Status: Within Functional Limits for tasks assessed ?  ?  ?  ?  ?  ?  ?  ?  ?  ?  ?  ?  ?  ?  ?  ?  ?General Comments: alert and oriented x4, fair safety awareness, mildly impulsive; pt voices frustration regarding NPO status ?  ?  ?General Comments  vss throughout ? ?  ?Exercises Other Exercises ?Other Exercises: edu: role of OT , role of rehab, discharge recommendations, therapeutic listening re: furstration over NPO status, falls prevention, home safety, ADL safety ?  ?Shoulder Instructions    ? ? ?Home Living Family/patient expects to be discharged to:: Private residence ?Living Arrangements: Other relatives (sister) ?Available Help at Discharge: Family;Available 24 hours/day ?Type of Home: House ?Home Access: Stairs to enter ?Entrance Stairs-Number of Steps: 3 ?  ?Home Layout: One level ?  ?  ?Bathroom Shower/Tub: Tub/shower unit ?  ?Bathroom Toilet: Standard ?  ?  ?Home Equipment: Advice worker (2 wheels) ?  ?  ?  ? ?  ?Prior Functioning/Environment   ?  ?  ?  ?  ?  ?  ?Mobility Comments: MOD I with RW per pt report household distances ?ADLs Comments: MOD I wtih ADL; assist with IADLs per pt report; sister and nephews are available to assist as needed per pt report ?  ? ?  ?  ?OT Problem List: Decreased strength;Impaired balance (sitting and/or standing);Decreased activity tolerance ?  ?   ?OT Treatment/Interventions: Self-care/ADL training;Therapeutic exercise;Modalities;Patient/family education;Balance  training;Energy conservation;Therapeutic activities;DME and/or AE instruction  ?  ?OT Goals(Current goals can be found in the care plan section) Acute Rehab OT Goals ?Patient Stated Goal: eat something ?OT Goal Formulation: With patient ?Time For Goal Achievement: 06/01/21 ?Potential to Achieve Goals: Fair ?ADL Goals ?Pt Will Perform Grooming: with modified independence;standing ?Pt Will Perform Upper Body Dressing: with modified independence ?Pt Will Transfer to Toilet: with modified independence (with RW)  ?OT Frequency: Min 2X/week ?  ? ?Co-evaluation   ?  ?  ?  ?  ? ?  ?AM-PAC OT "6 Clicks" Daily Activity     ?Outcome Measure Help from another person eating meals?: Total (NPO at this time) ?Help from another person taking care of personal grooming?: None ?Help from another person toileting, which includes using toliet, bedpan, or urinal?: None ?Help from another person bathing (including washing, rinsing, drying)?: A Little ?Help from another person to put on and taking off regular upper body clothing?: A Little ?Help from another person to put on and taking off regular lower body clothing?: A Little ?6 Click Score: 18 ?  ?End of Session Equipment Utilized During Treatment: Gait belt;Rolling walker (2 wheels) ?Nurse Communication: Mobility status ? ?Activity Tolerance:  Patient tolerated treatment well ?Patient left: in chair;with call bell/phone within reach;with chair alarm set ? ?OT Visit Diagnosis: Unsteadiness on feet (R26.81);Muscle weakness (generalized) (M62.81)  ?              ?Time: 6811-5726 ?OT Time Calculation (min): 38 min ?Charges:  OT General Charges ?$OT Visit: 1 Visit ?OT Evaluation ?$OT Eval Moderate Complexity: 1 Mod ?OT Treatments ?$Self Care/Home Management : 8-22 mins ? ?Shanon Payor, OTD OTR/L  ?05/18/21, 3:43 PM  ?

## 2021-05-18 NOTE — Assessment & Plan Note (Signed)
Replace and follow level. ? ?

## 2021-05-18 NOTE — Consult Note (Signed)
Chaplain met with pt at bedside. Pt was receptive and grateful for Chaplain visit. Pt sources of hope is knowing he will see his parents in the next life and that God has a plan for him but he does not believe he will live much longer. Chaplain offered compassionate presence and prayer.  ?

## 2021-05-18 NOTE — Progress Notes (Addendum)
? ? ? ?Progress Note  ? ? Kent [name]  XTG:[id number] DOB: 1956/02/24  DOA: 05/17/2021 ?PCP: Pcp, No  ? ? ? ? ?Brief Narrative:  ? ? ?Medical records reviewed and are as summarized below: ? ?Kent [name] is a 67 y.o. 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  He was sent to the emergency room after a swallow study showed aspiration due to dysphagia. ? ?He was admitted to the hospital for aspiration pneumonia.  He was treated with empiric IV antibiotics. ? ?  ? ? ? ? ?Assessment/Plan:  ? ?Principal Problem: ?  Aspiration pneumonia ([place]) ?Active Problems: ?  Diabetes mellitus without complication ([place]) ?  Anemia ?  Chronic diastolic CHF (congestive heart failure) ([place]) ?  Hypertension ?  Carcinoma of base of tongue ([place]) ? ? ? ?Body mass index is 22.34 kg/m?. ? ? ?Aspiration pneumonia: Continue empiric IV antibiotics.  Follow-up with speech therapist.  ? ?Dysphagia: Keep NPO for now.  Continue IV fluids for hydration.  He requested food by mouth but I explained that it is not safe for him to eat anything at this time.  He was insistent about this and said he has not had any problems with swallowing.  He does not seem to understand the magnitude of his problem. ? ?Stage IV squamous cell carcinoma of the base of the tongue/tonsil: He is on chemotherapy as an outpatient.  He had cycle 3 of CarboTaxol Keytruda on 04/22/2021.  Follow-up with oncologist ? ?Hypertension: Amlodipine and losartan have been held. ? ?Type II DM: He is on Lantus 18 units nightly at home.  This has been held because of NPO status.  Use NovoLog as needed for hyperglycemia. ? ?History of stroke, chronic diastolic CHF ? ?Plan of care was discussed with his sister, Butch Penny.  We discussed  enteral nutrition via PEG tube and goals of care.  Butch Penny said it appears patient is in denial.  She will continue to talk to the patient to discuss this further. ? ? ?Diet Order   ? ?       ?  Diet NPO time specified  Diet effective now       ?  ? ?  ?  ? ?  ? ? ? ? ? ? ? ? ? ?Consultants: ?Oncologist ? ?Procedures: ?None ? ? ? ?Medications:  ? ? enoxaparin (LOVENOX) injection  40 mg Subcutaneous Q24H  ? fluticasone  1 spray Each Nare Daily  ? hydrALAZINE  10 mg Intravenous Q8H  ? [START ON 05/20/2021] levothyroxine  12.5 mcg Intravenous Daily  ? sodium chloride flush  3 mL Intravenous Q12H  ? ?Continuous Infusions: ? cefTRIAXone (ROCEPHIN)  IV 2 g (05/18/21 0912)  ? dextrose 5 % lactated ringers with KCl/Additives Pediatric custom IV fluid    ? metronidazole 500 mg (05/18/21 0959)  ? ? ? ?Anti-infectives (From admission, onward)  ? ? Start     Dose/Rate Route Frequency Ordered Stop  ? 05/18/21 2000  cefTRIAXone (ROCEPHIN) 1 g in sodium chloride 0.9 % 100 mL IVPB  Status:  Discontinued       ? 1 g ?200 mL/hr over 30 Minutes Intravenous Every 24 hours 05/17/21 2208 05/17/21 2213  ? 05/18/21 0800  metroNIDAZOLE (FLAGYL) IVPB 500 mg       ?  500 mg ?100 mL/hr over 60 Minutes Intravenous Every 12 hours 05/17/21 2208    ? 05/18/21 0700  cefTRIAXone (ROCEPHIN) 2 g in sodium chloride 0.9 % 100 mL IVPB       ? 2 g ?200 mL/hr over 30 Minutes Intravenous Every 24 hours 05/17/21 2213 05/22/21 0659  ? 05/17/21 2015  cefTRIAXone (ROCEPHIN) 2 g in sodium chloride 0.9 % 100 mL IVPB       ? 2 g ?200 mL/hr over 30 Minutes Intravenous  Once 05/17/21 2004 05/17/21 2200  ? 05/17/21 2015  metroNIDAZOLE (FLAGYL) IVPB 500 mg       ? 500 mg ?100 mL/hr over 60 Minutes Intravenous  Once 05/17/21 2004 05/17/21 2200  ? ?  ? ? ? ? ? ? ? ? ? ?Family Communication/Anticipated D/C date and plan/Code Status  ? ?DVT prophylaxis: enoxaparin (LOVENOX) injection 40 mg Start: 05/17/21 2215 ? ? ?  Code Status: Full Code ? ?Family Communication:  Donna,sister ?Disposition Plan: Plan to discharge home in 2 to 4 days ? ? ?Status is: Observation ?The patient will require care spanning > 2 midnights and should be moved to inpatient because: On IV antibiotics, n.p.o. ? ? ? ? ? ? ?Subjective:  ? ?Interval events noted. ? ?Objective:  ? ? ?Vitals:  ? 05/18/21 0517 05/18/21 0733 05/18/21 1507 05/18/21 1530  ?BP: (!) 139/94 (!) 128/93 132/90 132/90  ?Pulse: 86 (!) 101 (!) 110   ?Resp: 16 16 16    ?Temp: (!) 97.5 ?F (36.4 ?C) 98 ?F (36.7 ?C) 98.2 ?F (36.8 ?C)   ?TempSrc: Oral Oral Oral   ?SpO2: 98% 98% 96% 96%  ?Weight:      ?Height:      ? ?No data found. ? ? ?Intake/Output Summary (Last 24 hours) at 05/18/2021 1558 ?Last data filed at 05/18/2021 0320 ?Gross per 24 hour  ?Intake --  ?Output 400 ml  ?Net -400 ml  ? ?Filed Weights  ? 05/17/21 1542 05/17/21 2313  ?Weight: 64.8 kg 64.7 kg  ? ? ?Exam: ? ?GEN: NAD ?SKIN: No rash ?EYES: EOMI ?ENT: MMM, enlarged tongue ?CV: RRR ?PULM: CTA B ?ABD: soft, ND, NT, +BS ?CNS: AAO x 3, non focal ?EXT: No edema or tenderness ?PSYCH: Poor insight and judgment ? ? ? ?  ? ? ?Data Reviewed:  ? ?I have personally reviewed following labs and imaging studies: ? ?Labs: ?Labs show the following:  ? ?Basic Metabolic Panel: ?Recent Labs  ?Lab 05/17/21 ?1544 05/18/21 ?0457  ?NA 135 138  ?K 3.7 3.6  ?CL 97* 102  ?CO2 30 32  ?GLUCOSE 90 77  ?BUN 11 8  ?CREATININE 0.69 0.55*  ?CALCIUM 8.4* 8.3*  ? ?GFR ?Estimated Creatinine Clearance: 83.1 mL/min (A) (by C-G formula based on SCr of 0.55 mg/dL (L)). ?Liver Function Tests: ?Recent Labs  ?Lab 05/18/21 ?5277  ?AST 18  ?ALT 13  ?ALKPHOS 86  ?BILITOT 0.4  ?PROT 6.3*  ?ALBUMIN 2.6*  ? ?No results for input(s): LIPASE, AMYLASE in the last 168 hours. ?No results for input(s): AMMONIA in the last 168 hours. ?Coagulation profile ?No results for input(s): INR, PROTIME in the last 168 hours. ? ?CBC: ?Recent Labs  ?Lab 05/17/21 ?1544 05/18/21 ?0457  ?WBC 9.2 8.2  ?HGB 10.0* 9.9*  ?HCT 31.9* 31.1*  ?MCV 88.6 87.9   ?PLT 226 194  ? ?Cardiac Enzymes: ?No results for input(s): CKTOTAL, CKMB, CKMBINDEX, TROPONINI in the last 168 hours. ?BNP (last 3 results) ?No results for input(s): PROBNP in the  last 8760 hours. ?CBG: ?No results for input(s): GLUCAP in the last 168 hours. ?D-Dimer: ?No results for input(s): DDIMER in the last 72 hours. ?Hgb A1c: ?No results for input(s): HGBA1C in the last 72 hours. ?Lipid Profile: ?No results for input(s): CHOL, HDL, LDLCALC, TRIG, CHOLHDL, LDLDIRECT in the last 72 hours. ?Thyroid function studies: ?No results for input(s): TSH, T4TOTAL, T3FREE, THYROIDAB in the last 72 hours. ? ?Invalid input(s): FREET3 ?Anemia work up: ?No results for input(s): VITAMINB12, FOLATE, FERRITIN, TIBC, IRON, RETICCTPCT in the last 72 hours. ?Sepsis Labs: ?Recent Labs  ?Lab 05/17/21 ?1544 05/18/21 ?0457  ?WBC 9.2 8.2  ? ? ?Microbiology ?Recent Results (from the past 240 hour(s))  ?Resp Panel by RT-PCR (Flu A&B, Covid) Nasopharyngeal Swab     Status: None  ? Collection Time: 05/17/21  7:19 PM  ? Specimen: Nasopharyngeal Swab; Nasopharyngeal(NP) swabs in vial transport medium  ?Result Value Ref Range Status  ? SARS Coronavirus 2 by RT PCR NEGATIVE NEGATIVE Final  ?  Comment: (NOTE) ?SARS-CoV-2 target nucleic acids are NOT DETECTED. ? ?The SARS-CoV-2 RNA is generally detectable in upper respiratory ?specimens during the acute phase of infection. The lowest ?concentration of SARS-CoV-2 viral copies this assay can detect is ?138 copies/mL. A negative result does not preclude SARS-Cov-2 ?infection and should not be used as the sole basis for treatment or ?other patient management decisions. A negative result may occur with  ?improper specimen collection/handling, submission of specimen other ?than nasopharyngeal swab, presence of viral mutation(s) within the ?areas targeted by this assay, and inadequate number of viral ?copies(<138 copies/mL). A negative result must be combined with ?clinical observations, patient  history, and epidemiological ?information. The expected result is Negative. ? ?Fact Sheet for Patients:  ?EntrepreneurPulse.com.au ? ?Fact Sheet for Healthcare Providers:  ?SeeState.com.cy

## 2021-05-18 NOTE — Assessment & Plan Note (Signed)
Home rgimen of long acting is held. ?Glycemic protocol / SSI/ ?

## 2021-05-18 NOTE — Consult Note (Signed)
[facility] [facility] ?CONSULT NOTE ? ?Patient Care Team: ?Pcp, No as PCP - General ?Margaretha Sheffield, MD (Otolaryngology) ?Sindy Guadeloupe, MD as Consulting Physician (Oncology) ? ?CHIEF COMPLAINTS/PURPOSE OF CONSULTATION: Throat cancer/aspiration ? ?HISTORY OF PRESENTING ILLNESS:  ?Kent [name] 66 y.o.  male with multiple medical problems including squamous cell carcinoma stage IV base of the left tonsil currently on chemoimmunotherapy he is admitted to hospital for currently admitted to hospital for difficulty swallowing. ? ?Patient is a poor historian-he states that he has some difficulty swallowing.  However, on abnormal swallow study noted to have significant aspiration.  Also of note patient had a recent imaging that showed bilateral multifocal infiltrative lesions concerning for pneumonia/aspiration.  ? ?Patient admits to shortness of breath on exertion.  No fever or chills.  Mild cough.   ? ?Of note patient was recently evaluated in the symptomatic management clinic for pneumonia-started on antibiotics. ? ? ?Review of Systems  ?Unable to perform ROS: Other   ? ?MEDICAL HISTORY:  ?Past Medical History:  ?Diagnosis Date  ? Arthritis   ? Diabetes mellitus without complication ([place])   ? Generalized weakness 06/22/2019  ? Hyperlipidemia   ? Hypertension   ? Hypertensive urgency 06/22/2019  ? Obesity   ? Osteomyelitis ([place])   ? From pt chart  ? Renal insufficiency   ? CKD noted on chart  ? Staph infection 05/29/2016  ? Nov 2017. Staph infection in LT shoulder requiring debridement and grafts  ? Status post debridement   ? Stomach cancer ([place])   ? had tumor removed- [facility]  ? Tongue cancer ([place])   ? 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 ? ?SURGICAL HISTORY: ?Past Surgical History:  ?Procedure Laterality Date  ? EXCISION OF TONGUE LESION Left 06/19/2016  ? Procedure: EXCISION OF TONGUE LESION;  Surgeon: Margaretha Sheffield, MD;  Location: [facility];  Service: ENT;  Laterality:  Left;  diabetic - insulin and oral meds - not using either currently  ? gastric tumor removed  2010  ? I & D EXTREMITY Left 01/09/2016  ? Procedure: IRRIGATION AND DEBRIDEMENT EXTREMITY;  Surgeon: Robert Bellow, MD;  Location: [facility];  Service: General;  Laterality: Left;  ? LARYNGOSCOPY N/A 06/19/2016  ? Procedure: LARYNGOSCOPY;  Surgeon: Margaretha Sheffield, MD;  Location: [facility];  Service: ENT;  Laterality: N/A;  ? PERIPHERAL VASCULAR CATHETERIZATION Right 12/31/2015  ? Procedure: Lower Extremity Angiography;  Surgeon: Algernon Huxley, MD;  Location: [facility];  Service: Cardiovascular;  Laterality: Right;  ? PORTA CATH INSERTION N/A 03/07/2021  ? Procedure: PORTA CATH INSERTION;  Surgeon: Algernon Huxley, MD;  Location: [facility];  Service: Cardiovascular;  Laterality: N/A;  ? tongue mass biopsy    ? ? ?SOCIAL HISTORY: ?Social History  ? ?Socioeconomic History  ? Marital status: Married  ?  Spouse name: Not on file  ? Number of children: Not on file  ? Years of education: Not on file  ? Highest education level: Not on file  ?Occupational History  ? Not on file  ?Tobacco Use  ? Smoking status: Never  ? Smokeless tobacco: Never  ?Vaping Use  ? Vaping Use: Never used  ?Substance and Sexual Activity  ? Alcohol use: No  ?  Alcohol/week: 0.0 standard drinks  ? Drug use: No  ? Sexual activity: Not on file  ?Other Topics Concern  ? Not on file  ?Social History Narrative  ?  Lives at home with wife. Ambulates independently.  ? ?Social Determinants of Health  ? ?Financial Resource Strain: Not on file  ?Food Insecurity: Not on file  ?Transportation Needs: Not on file  ?Physical Activity: Not on file  ?Stress: Not on file  ?Social Connections: Not on file  ?Intimate Partner Violence: Not on file  ? ? ?FAMILY HISTORY: ?Family History  ?Problem Relation Age of Onset  ? Heart disease Mother   ? Hypertension Father   ? Cancer Sister   ?     stomach  ? Heart disease Brother   ?     MI  ? Cancer Daughter   ?  Cancer Son   ?     testicular  ? ADD / ADHD Sister   ? ? ?ALLERGIES:  has No Known Allergies. ? ?MEDICATIONS:  ?Current Facility-Administered Medications  ?Medication Dose Route Frequency Provider Last Rate Last Admin  ? acetaminophen (TYLENOL) tablet 650 mg  650 mg Oral Q6H PRN Para Skeans, MD      ? Or  ? acetaminophen (TYLENOL) suppository 650 mg  650 mg Rectal Q6H PRN Para Skeans, MD      ? albuterol (PROVENTIL) (2.5 MG/3ML) 0.083% nebulizer solution 2.5 mg  2.5 mg Nebulization Q2H PRN Para Skeans, MD      ? cefTRIAXone (ROCEPHIN) 2 g in sodium chloride 0.9 % 100 mL IVPB  2 g Intravenous Q24H Florina Ou V, MD 200 mL/hr at 05/18/21 0912 2 g at 05/18/21 0912  ? dextrose 5% in lactated ringers with KCl 20 mEq/L infusion   Intravenous Continuous Jennye Boroughs, MD      ? enoxaparin (LOVENOX) injection 40 mg  40 mg Subcutaneous Q24H Para Skeans, MD   40 mg at 05/17/21 2332  ? fluticasone (FLONASE) 50 MCG/ACT nasal spray 1 spray  1 spray Each Nare Daily Para Skeans, MD   1 spray at 05/18/21 4166  ? hydrALAZINE (APRESOLINE) injection 10 mg  10 mg Intravenous Q8H Para Skeans, MD      ? hydrALAZINE (APRESOLINE) injection 5 mg  5 mg Intravenous Q4H PRN Para Skeans, MD      ? Derrill Memo ON 05/20/2021] levothyroxine (SYNTHROID, LEVOTHROID) injection 12.5 mcg  12.5 mcg Intravenous Daily Florina Ou V, MD      ? metroNIDAZOLE (FLAGYL) IVPB 500 mg  500 mg Intravenous Q12H Florina Ou V, MD 100 mL/hr at 05/18/21 0959 500 mg at 05/18/21 0959  ? morphine (PF) 2 MG/ML injection 2 mg  2 mg Intravenous Q2H PRN Para Skeans, MD      ? ondansetron [facility]) injection 4 mg  4 mg Intravenous Q6H PRN Para Skeans, MD      ? sodium chloride flush (NS) 0.9 % injection 3 mL  3 mL Intravenous Q12H Para Skeans, MD   3 mL at 05/17/21 2329  ? ? ?  ?. ? ?PHYSICAL EXAMINATION: ? ?Vitals:  ? 05/18/21 1507 05/18/21 1530  ?BP: 132/90 132/90  ?Pulse: (!) 110   ?Resp: 16   ?Temp: 98.2 ?F (36.8 ?C)   ?SpO2: 96% 96%  ? ?Filed Weights  ?  05/17/21 1542 05/17/21 2313  ?Weight: 142 lb 13.7 oz (64.8 kg) 142 lb 10.2 oz (64.7 kg)  ? ? ?Physical Exam ?Vitals and nursing note reviewed.  ?HENT:  ?   Head: Normocephalic and atraumatic.  ?   Mouth/Throat:  ?   Pharynx: Oropharynx is clear.  ?Eyes:  ?   Extraocular  Movements: Extraocular movements intact.  ?   Pupils: Pupils are equal, round, and reactive to light.  ?Cardiovascular:  ?   Rate and Rhythm: Normal rate and regular rhythm.  ?Pulmonary:  ?   Comments: Decreased breath sounds bilaterally.  ?Abdominal:  ?   Palpations: Abdomen is soft.  ?Musculoskeletal:     ?   General: Normal range of motion.  ?   Cervical back: Normal range of motion.  ?Skin: ?   General: Skin is warm.  ?Neurological:  ?   General: No focal deficit present.  ?   Mental Status: He is alert and oriented to person, place, and time.  ?Psychiatric:     ?   Behavior: Behavior normal.     ?   Judgment: Judgment normal.  ? ? ? ?LABORATORY DATA:  ?I have reviewed the data as listed ?Lab Results  ?Component Value Date  ? WBC 8.2 05/18/2021  ? HGB 9.9 (L) 05/18/2021  ? HCT 31.1 (L) 05/18/2021  ? MCV 87.9 05/18/2021  ? PLT 194 05/18/2021  ? ?Recent Labs  ?  04/12/21 ?1318 04/22/21 ?2119 05/02/21 ?4174 05/17/21 ?1544 05/18/21 ?0457  ?NA 130* 132* 129* 135 138  ?K 3.6 3.7 3.4* 3.7 3.6  ?CL 96* 98 93* 97* 102  ?CO2 26 27 27 30  32  ?GLUCOSE 151* 115* 105* 90 77  ?BUN 11 11 13 11 8   ?CREATININE 0.58* 0.85 0.69 0.69 0.55*  ?CALCIUM 8.0* 8.1* 7.9* 8.4* 8.3*  ?GFRNONAA >60 >60 >60 >60 >60  ?PROT 5.8* 6.5  --   --  6.3*  ?ALBUMIN 2.7* 2.8*  --   --  2.6*  ?AST 20 19  --   --  18  ?ALT 19 13  --   --  13  ?ALKPHOS 100 108  --   --  86  ?BILITOT 0.3 0.4  --   --  0.4  ? ? ?RADIOGRAPHIC STUDIES: ?I have personally reviewed the radiological images as listed and agreed with the findings in the report. ?DG Chest 2 View ? ?Result Date: 05/17/2021 ?CLINICAL DATA:  Patient aspirated during a swallowing study today. EXAM: CHEST - 2 VIEW COMPARISON:  06/22/2019  FINDINGS: Cardiac silhouette is normal in size and configuration. Normal mediastinal and hilar contours. Lungs demonstrate mild bilateral interstitial prominence, stable and chronic. No lung consolidation. N

## 2021-05-18 NOTE — Evaluation (Signed)
Physical Therapy Evaluation ?Patient Details ?Name: Kent [name] ?MRN: [id number] ?DOB: [birth year] ?Today's Date: 05/18/2021 ? ?History of Present Illness ? pt is a 66 year old male presenting to ED after an abnormal swallow study and found to have bilateral pneumonia. 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 Patient seated in recliner upon arrival to session; alert and oriented to basic information, follows commands and agreeable to participation with session.  Denies pain at this time.  Baseline visual deficits (L eye adducted and disconjugate) and L UE/LE hemiparesis noted; unchanged with this admission.  Able to complete sit/stand, basic transfers and gait (54') with RW, cga/min assist.  Demonstrates narrowed BOS with inconsistent L LE placement; signifcant varus to L knee in loading; L post/lateral hip weakness; L lateral lean at times, but no overt LOB.  Do recommend continued use of RW at all times for optimal safety/indep. ?Would benefit from skilled PT to address above deficits and promote optimal return to PLOF.; Recommend transition to HHPT upon discharge from acute hospitalization. ? ?   ? ?Recommendations for follow up therapy are one component of a multi-disciplinary discharge planning process, led by the attending physician.  Recommendations may be updated based on patient status, additional functional criteria and insurance authorization. ? ?Follow Up Recommendations Home health PT ? ?  ?Assistance Recommended at Discharge PRN  ?Patient can return home with the following ? A little help with walking and/or transfers;A little help with bathing/dressing/bathroom ? ?  ?Equipment Recommendations    ?Recommendations for Other Services ?    ?  ?Functional Status Assessment Patient has had a recent decline in their  functional status and demonstrates the ability to make significant improvements in function in a reasonable and predictable amount of time.  ? ?  ?Precautions / Restrictions Precautions ?Precautions: Fall ?Precaution Comments: left sided residual weakness from previous stroke ?Restrictions ?Weight Bearing Restrictions: No  ? ?  ? ?Mobility ? Bed Mobility ?  ?  ?  ?  ?  ?  ?  ?General bed mobility comments: seated in recliner beginning/end of treatment session ?  ? ?Transfers ?Overall transfer level: Needs assistance ?Equipment used: Rolling walker (2 wheels) ?Transfers: Sit to/from Stand ?Sit to Stand: Min guard ?  ?  ?  ?  ?  ?General transfer comment: cuing for hand placement ?  ? ?Ambulation/Gait ?Ambulation/Gait assistance: Min assist ?Gait Distance (Feet): 60 Feet ?Assistive device: Rolling walker (2 wheels) ?  ?  ?  ?  ?General Gait Details: narrowed BOS with inconsistent L LE placement; signifcant varus to L knee in loading; L post/lateral hip weakness; L lateral lean at times, but no overt LOB.  Do recommend continued use of RW at all times for optimal safety/indep. ? ?Stairs ?  ?  ?  ?  ?  ? ?Wheelchair Mobility ?  ? ?Modified Rankin (Stroke Patients Only) ?  ? ?  ? ?Balance Overall balance assessment: Needs assistance ?Sitting-balance support: No upper extremity supported, Feet supported ?Sitting balance-Leahy Scale: Good ?  ?  ?Standing balance support: Bilateral upper extremity supported ?Standing balance-Leahy Scale: Fair ?  ?  ?  ?  ?  ?  ?  ?  ?  ?  ?  ?  ?   ? ? ? ?Pertinent Vitals/Pain Pain Assessment ?Pain Assessment: No/denies pain  ? ? ?Home  Living Family/patient expects to be discharged to:: Private residence ?Living Arrangements: Other relatives ?Available Help at Discharge: Family;Available 24 hours/day ?Type of Home: House ?Home Access: Stairs to enter ?  ?Entrance Stairs-Number of Steps: 3 ?  ?Home Layout: One level ?Home Equipment: Advice worker (2 wheels) ?   ?  ?Prior  Function Prior Level of Function : Independent/Modified Independent ?  ?  ?  ?  ?  ?  ?Mobility Comments: MOD I with RW per pt report household distances ?ADLs Comments: MOD I wtih ADL; assist with IADLs per pt report; sister and nephews are available to assist as needed per pt report ?  ? ? ?Hand Dominance  ? Dominant Hand: Left ? ?  ?Extremity/Trunk Assessment  ? Upper Extremity Assessment ?Upper Extremity Assessment: Defer to OT evaluation ?RUE Deficits / Details: RUE MMT 4-/5 throughout, mildly weak grip strength ?LUE Deficits / Details: AROM: L shoulder flexion to approx 80 degrees, elbow 3/4 full ROM, wrist 1/2 full ROM, residual weakness throughout; pt is L hand dominant ?LUE Sensation: decreased light touch (baseline) ?LUE Coordination: decreased fine motor;decreased gross motor ?  ? ?Lower Extremity Assessment ?Lower Extremity Assessment:  (L LE grossly 4-/5 with isolated testing, denies paresthesia; R LE grossly WFL) ?  ? ?   ?Communication  ? Communication: Expressive difficulties;Other (comment)  ?Cognition Arousal/Alertness: Awake/alert ?Behavior During Therapy: [facility] for tasks assessed/performed ?Overall Cognitive Status: Within Functional Limits for tasks assessed ?  ?  ?  ?  ?  ?  ?  ?  ?  ?  ?  ?  ?  ?  ?  ?  ?  ?  ?  ? ?  ?General Comments General comments (skin integrity, edema, etc.): vss throughout ? ?  ?Exercises Other Exercises ?Other Exercises: Visual screening: L eye adducted and disconjugate with R eye.  Patient does endorse diplopia when questioned; may benefit from trial of partial occlusion taping in subsequent session.  Additionally, suspect some degree of L visual field inattention/cut; will continue to assess further  ? ?Assessment/Plan  ?  ?PT Assessment Patient needs continued PT services  ?PT Problem List Decreased strength;Decreased activity tolerance;Decreased balance;Decreased mobility;Decreased knowledge of use of DME;Decreased safety awareness;Decreased knowledge of  precautions ? ?   ?  ?PT Treatment Interventions DME instruction;Gait training;Functional mobility training;Stair training;Therapeutic activities;Patient/family education;Cognitive remediation;Balance training;Therapeutic exercise   ? ?PT Goals (Current goals can be found in the Care Plan section)  ?Acute Rehab PT Goals ?Patient Stated Goal: to get something to eat, to get back home ?PT Goal Formulation: With patient ?Time For Goal Achievement: 06/01/21 ?Potential to Achieve Goals: Fair ? ?  ?Frequency Min 2X/week ?  ? ? ?Co-evaluation   ?  ?  ?  ?  ? ? ?  ?AM-PAC PT "6 Clicks" Mobility  ?Outcome Measure Help needed turning from your back to your side while in a flat bed without using bedrails?: None ?Help needed moving from lying on your back to sitting on the side of a flat bed without using bedrails?: None ?Help needed moving to and from a bed to a chair (including a wheelchair)?: A Little ?Help needed standing up from a chair using your arms (e.g., wheelchair or bedside chair)?: A Little ?Help needed to walk in hospital room?: A Little ?Help needed climbing 3-5 steps with a railing? : A Little ?6 Click Score: 20 ? ?  ?End of Session Equipment Utilized During Treatment: Gait belt ?Activity Tolerance: Patient tolerated treatment well ?Patient left:  in chair;with call bell/phone within reach;with chair alarm set ?Nurse Communication: Mobility status ?PT Visit Diagnosis: Muscle weakness (generalized) (M62.81);Difficulty in walking, not elsewhere classified (R26.2) ?  ? ?Time: 1610-9604 ?PT Time Calculation (min) (ACUTE ONLY): 15 min ? ? ?Charges:   PT Evaluation ?$PT Eval Moderate Complexity: 1 Mod ?  ?  ?   ? ? ?Cleburne Savini H. Owens Shark, PT, DPT, NCS ?05/18/21, 5:16 PM ?[phone] ? ? ?

## 2021-05-18 NOTE — Assessment & Plan Note (Signed)
CBC Latest Ref Rng & Units 05/17/2021 04/22/2021 04/12/2021  ?WBC 4.0 - 10.5 K/uL 9.2 10.0 2.2(L)  ?Hemoglobin 13.0 - 17.0 g/dL 10.0(L) 10.6(L) 9.2(L)  ?Hematocrit 39.0 - 52.0 % 31.9(L) 32.5(L) 27.6(L)  ?Platelets 150 - 400 K/uL 226 284 178  ?Pt being followed by haematology.  ?We will place oncology consult. ? ? ?

## 2021-05-18 NOTE — Assessment & Plan Note (Signed)
Pt found to have aspiration pneumonia.  ?We will cont him on iv abx on rocephin and flagyl . ?Supplemental oxygen as needed. 3 ?Swallow eval and speech consult . ? ?

## 2021-05-19 DIAGNOSIS — C01 Malignant neoplasm of base of tongue: Secondary | ICD-10-CM | POA: Diagnosis not present

## 2021-05-19 DIAGNOSIS — J690 Pneumonitis due to inhalation of food and vomit: Secondary | ICD-10-CM | POA: Diagnosis not present

## 2021-05-19 LAB — BASIC METABOLIC PANEL
Anion gap: 6 (ref 5–15)
BUN: 10 mg/dL (ref 8–23)
CO2: 28 mmol/L (ref 22–32)
Calcium: 8.5 mg/dL — ABNORMAL LOW (ref 8.9–10.3)
Chloride: 102 mmol/L (ref 98–111)
Creatinine, Ser: 0.55 mg/dL — ABNORMAL LOW (ref 0.61–1.24)
GFR, Estimated: >60 mL/min (ref >60)
Glucose, Bld: 93 mg/dL (ref 70–99)
Potassium: 3.8 mmol/L (ref 3.5–5.1)
Sodium: 136 mmol/L (ref 135–145)

## 2021-05-19 LAB — CBC WITH DIFFERENTIAL/PLATELET
Abs Immature Granulocytes: 0.03 10*3/uL (ref 0.00–0.07)
Basophils Absolute: 0.1 10*3/uL (ref 0.0–0.1)
Basophils Relative: 2 %
Eosinophils Absolute: 0 10*3/uL (ref 0.0–0.5)
Eosinophils Relative: 1 %
HCT: 31.5 % — ABNORMAL LOW (ref 39.0–52.0)
Hemoglobin: 10 g/dL — ABNORMAL LOW (ref 13.0–17.0)
Immature Granulocytes: 1 %
Lymphocytes Relative: 15 %
Lymphs Abs: 0.9 10*3/uL (ref 0.7–4.0)
MCH: 27.7 pg (ref 26.0–34.0)
MCHC: 31.7 g/dL (ref 30.0–36.0)
MCV: 87.3 fL (ref 80.0–100.0)
Monocytes Absolute: 0.6 10*3/uL (ref 0.1–1.0)
Monocytes Relative: 10 %
Neutro Abs: 4.3 10*3/uL (ref 1.7–7.7)
Neutrophils Relative %: 71 %
Platelets: 197 10*3/uL (ref 150–400)
RBC: 3.61 MIL/uL — ABNORMAL LOW (ref 4.22–5.81)
RDW: 17.3 % — ABNORMAL HIGH (ref 11.5–15.5)
WBC: 6 10*3/uL (ref 4.0–10.5)
nRBC: 0 % (ref 0.0–0.2)

## 2021-05-19 NOTE — Progress Notes (Addendum)
? ? ? ?Progress Note  ? ? Kent [name]  QMV:[id number] DOB: [birth year]  DOA: 05/17/2021 ?PCP: Pcp, No  ? ? ? ? ?Brief Narrative:  ? ? ?Medical records reviewed and are as summarized below: ? ?Byren Pankow Bottino is a 66 y.o. 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  He was sent to the emergency room after a swallow study showed aspiration due to dysphagia. ? ?He was admitted to the hospital for aspiration pneumonia.  He was treated with empiric IV antibiotics. ? ?  ? ? ? ? ?Assessment/Plan:  ? ?Principal Problem: ?  Aspiration pneumonia ([place]) ?Active Problems: ?  Diabetes mellitus without complication ([place]) ?  Anemia ?  Chronic diastolic CHF (congestive heart failure) ([place]) ?  Hypertension ?  Carcinoma of base of tongue ([place]) ? ? ? ?Body mass index is 22.34 kg/m?. ? ? ?Aspiration pneumonia ?Severe dysphagia ?Stage IV squamous cell carcinoma of the base of the tongue/tonsil ?Hypertension ?Type 2 diabetes mellitus ?Chronic diastolic CHF ?History of stroke ? ? ?PLAN ? ?Plan of care was discussed with the patient, his sister, Butch Penny and his cousin, Jeneen Rinks, at the bedside.  Patient has decided against PEG tube for enteral nutrition.  He wants to be DNR and has opted for comfort measures with hospice.  He understands that it is unsafe for him to eat or drink.  [facility] wants to [facility] to [facility].  His family is agreeable with this plan.  Consulted hospice team to assist with disposition. ? ? ? ? ? ? ?Diet Order   ? ?       ?  Diet NPO time specified  Diet effective now       ?  ? ?  ?  ? ?  ? ? ? ? ? ? ? ? ? ?Consultants: ?Oncologist ? ?Procedures: ?None ? ? ? ?Medications:  ? ? chlorhexidine  15 mL Mouth Rinse BID  ? fluticasone  1 spray Each Nare Daily  ? mouth  rinse  15 mL Mouth Rinse q12n4p  ? sodium chloride flush  3 mL Intravenous Q12H  ? ?Continuous Infusions: ? ? ? ? ?Anti-infectives (From admission, onward)  ? ? Start     Dose/Rate Route Frequency Ordered Stop  ? 05/18/21 2000  cefTRIAXone (ROCEPHIN) 1 g in sodium chloride 0.9 % 100 mL IVPB  Status:  Discontinued       ? 1 g ?200 mL/hr over 30 Minutes Intravenous Every 24 hours 05/17/21 2208 05/17/21 2213  ? 05/18/21 0800  metroNIDAZOLE (FLAGYL) IVPB 500 mg  Status:  Discontinued       ? 500 mg ?100 mL/hr over 60 Minutes Intravenous Every 12 hours 05/17/21 2208 05/19/21 1318  ? 05/18/21 0700  cefTRIAXone (ROCEPHIN) 2 g in sodium chloride 0.9 % 100 mL IVPB  Status:  Discontinued       ? 2 g ?200 mL/hr over 30 Minutes Intravenous Every 24 hours 05/17/21 2213 05/19/21 1318  ? 05/17/21 2015  cefTRIAXone (ROCEPHIN) 2 g in sodium chloride 0.9 % 100 mL IVPB       ? 2 g ?200 mL/hr over 30 Minutes Intravenous  Once 05/17/21 2004 05/17/21 2200  ? 05/17/21 2015  metroNIDAZOLE (FLAGYL) IVPB 500 mg       ? 500 mg ?100  mL/hr over 60 Minutes Intravenous  Once 05/17/21 2004 05/17/21 2200  ? ?  ? ? ? ? ? ? ? ? ? ?Family Communication/Anticipated D/C date and plan/Code Status  ? ?DVT prophylaxis:  ? ? ?  Code Status: DNR ? ?Family Communication: Donna,sister ?Disposition Plan: Plan to discharge home in 2 to 4 days ? ? ?Status is: Observation ?The patient will require care spanning > 2 midnights and should be moved to inpatient because: On IV antibiotics, n.p.o. ? ? ? ? ? ? ?Subjective:  ? ?Interval events noted.  No complaints.  No abdominal pain, vomiting, shortness of breath or chest pain ? ?Objective:  ? ? ?Vitals:  ? 05/18/21 1530 05/18/21 1941 05/19/21 0347 05/19/21 3335  ?BP: 132/90 (!) 163/95 123/85 119/72  ?Pulse:  93 88 79  ?Resp:  18 20   ?Temp:   97.9 ?F (36.6 ?C) 97.9 ?F (36.6 ?C)  ?TempSrc:      ?SpO2: 96% 96% 94% 91%  ?Weight:      ?Height:      ? ?No data found. ? ? ?Intake/Output Summary (Last 24 hours) at 05/19/2021  1327 ?Last data filed at 05/19/2021 0141 ?Gross per 24 hour  ?Intake 1485.07 ml  ?Output 200 ml  ?Net 1285.07 ml  ? ?Filed Weights  ? 05/17/21 1542 05/17/21 2313  ?Weight: 64.8 kg 64.7 kg  ? ? ?Exam: ? ?GEN: NAD ?SKIN: No rash ?EYES: EOMI ?ENT: MMM, enlarged tongue ?CV: RRR ?PULM: CTA B ?ABD: soft, ND, NT, +BS ?CNS: AAO x 3, non focal, slurred speech from enlarged tongue ?EXT: No edema or tenderness ? ? ? ?  ? ? ?Data Reviewed:  ? ?I have personally reviewed following labs and imaging studies: ? ?Labs: ?Labs show the following:  ? ?Basic Metabolic Panel: ?Recent Labs  ?Lab 05/17/21 ?1544 05/18/21 ?0457 05/19/21 ?0433  ?NA 135 138 136  ?K 3.7 3.6 3.8  ?CL 97* 102 102  ?CO2 30 32 28  ?GLUCOSE 90 77 93  ?BUN 11 8 10   ?CREATININE 0.69 0.55* 0.55*  ?CALCIUM 8.4* 8.3* 8.5*  ? ?GFR ?Estimated Creatinine Clearance: 83.1 mL/min (A) (by C-G formula based on SCr of 0.55 mg/dL (L)). ?Liver Function Tests: ?Recent Labs  ?Lab 05/18/21 ?4562  ?AST 18  ?ALT 13  ?ALKPHOS 86  ?BILITOT 0.4  ?PROT 6.3*  ?ALBUMIN 2.6*  ? ?No results for input(s): LIPASE, AMYLASE in the last 168 hours. ?No results for input(s): AMMONIA in the last 168 hours. ?Coagulation profile ?No results for input(s): INR, PROTIME in the last 168 hours. ? ?CBC: ?Recent Labs  ?Lab 05/17/21 ?1544 05/18/21 ?0457 05/19/21 ?0433  ?WBC 9.2 8.2 6.0  ?NEUTROABS  --   --  4.3  ?HGB 10.0* 9.9* 10.0*  ?HCT 31.9* 31.1* 31.5*  ?MCV 88.6 87.9 87.3  ?PLT 226 194 197  ? ?Cardiac Enzymes: ?No results for input(s): CKTOTAL, CKMB, CKMBINDEX, TROPONINI in the last 168 hours. ?BNP (last 3 results) ?No results for input(s): PROBNP in the last 8760 hours. ?CBG: ?No results for input(s): GLUCAP in the last 168 hours. ?D-Dimer: ?No results for input(s): DDIMER in the last 72 hours. ?Hgb A1c: ?No results for input(s): HGBA1C in the last 72 hours. ?Lipid Profile: ?No results for input(s): CHOL, HDL, LDLCALC, TRIG, CHOLHDL, LDLDIRECT in the last 72 hours. ?Thyroid function studies: ?No results  for input(s): TSH, T4TOTAL, T3FREE, THYROIDAB in the last 72 hours. ? ?Invalid input(s): FREET3 ?Anemia work up: ?No results for input(s): VITAMINB12, FOLATE, FERRITIN, TIBC, IRON,  RETICCTPCT in the last 72 hours. ?Sepsis Labs: ?Recent Labs  ?Lab 05/17/21 ?1544 05/18/21 ?0457 05/19/21 ?0433  ?WBC 9.2 8.2 6.0  ? ? ?Microbiology ?Recent Results (from the past 240 hour(s))  ?Resp Panel by RT-PCR (Flu A&B, Covid) Nasopharyngeal Swab     Status: None  ? Collection Time: 05/17/21  7:19 PM  ? Specimen: Nasopharyngeal Swab; Nasopharyngeal(NP) swabs in vial transport medium  ?Result Value Ref Range Status  ? SARS Coronavirus 2 by RT PCR NEGATIVE NEGATIVE Final  ?  Comment: (NOTE) ?SARS-CoV-2 target nucleic acids are NOT DETECTED. ? ?The SARS-CoV-2 RNA is generally detectable in upper respiratory ?specimens during the acute phase of infection. The lowest ?concentration of SARS-CoV-2 viral copies this assay can detect is ?138 copies/mL. A negative result does not preclude SARS-Cov-2 ?infection and should not be used as the sole basis for treatment or ?other patient management decisions. A negative result may occur with  ?improper specimen collection/handling, submission of specimen other ?than nasopharyngeal swab, presence of viral mutation(s) within the ?areas targeted by this assay, and inadequate number of viral ?copies(<138 copies/mL). A negative result must be combined with ?clinical observations, patient history, and epidemiological ?information. The expected result is Negative. ? ?Fact Sheet for Patients:  ?EntrepreneurPulse.com.au ? ?Fact Sheet for Healthcare Providers:  ?IncredibleEmployment.be ? ?This test is no t yet approved or cleared by the [place] FDA and  ?has been authorized for detection and/or diagnosis of SARS-CoV-2 by ?FDA under an Emergency Use Authorization (EUA). This EUA will remain  ?in effect (meaning this test can be used) for the duration of the ?COVID-19  declaration under Section 564(b)(1) of the Act, 21 ?U.S.C.section 360bbb-3(b)(1), unless the authorization is terminated  ?or revoked sooner.  ? ? ?  ? Influenza A by PCR NEGATIVE NEGATIVE Final  ? Influenza B

## 2021-05-19 NOTE — TOC Initial Note (Addendum)
Transition of Care (TOC) - Initial/Assessment Note  ? ? ?Patient Details  ?Name: Kent [name] ?MRN: [id number] ?Date of Birth: 10-06-1955 ? ?Transition of Care (TOC) CM/SW Contact:    ?Evian Derringer E Kalaya Infantino, LCSW ?Phone Number: ?05/19/2021, 9:29 AM ? ?Clinical Narrative:                 ?Per chart review, patient has memory impairment.  ?CSW spoke with patient's sister Butch Penny via phone.  ?Patient lives with Butch Penny for the past year. Cousin provides transportation. Does not have a PCP, has been going [facility] the [facility] about 6 [facility]. Pharmacy is [facility]. Patient has a rolling walker at home.  ?Butch Penny reported she talked to Oncology and thinks patient is going to need hospice care. She prefers [facility]. She said she is going to come talk to patient at bedside today about this. She is not sure if patient wants a feeding tube or not.  ?Butch Penny stated patient lives with her, but she does not feel she can meet patient's care needs any longer. She asked about placement. She said patient does not qualify for Medicaid and does not have the money to private pay for placement. She asked if hospice would cover placement.  ?CSW reached out to [employer] to inquire about options. ?Provided CSW's number for Butch Penny to call back with any updates or questions after speaking with patient.  ? ?10:00- Confirmed with [facility] Rep Misty that hospice does not pay for nursing home placement, this would still need to be financially covered by [facility] or private pay. ?Spoke to sister who says they can't afford this. Explained the option would be hospice at home or home health. She said she has noone to help her care for him at home, her son lives with her but he is also sick.  ?[facility] Rep Misty to look over case. ? ?11:30- [facility] Rep Misty confirmed patient likely would not qualify for hospice home. He would qualify for hospice care at home. Notified MD and RN.  ? ?12:10- Call from sister who said she  is at bedside and patient decided not to get the feeding tube. She stated patient wants to go to hospice home. ?Explained patient must qualify for hospice home and have 2 weeks or less prognosis. Explained if patient did not qualify, he would need to go home with hospice then could transition to hospice home when appropriate.  ?Asked [facility] Rep to follow up with Butch Penny as well to start hospice process.  ?Asked MD to talk to patient and Butch Penny as well.  ? ?1:10- Per MD patient to transition to comfort care and might qualify for hospice home. Notified Misty who is going to meet with patient and sister at bedside.  ? ?1:50- [facility] Rep Rojelio Brenner has met with patient at bedside and spoke to sister and explained in patient's current status, patient does not qualify for hospice home. Does qualify for hospice at home.  ? ? ?Expected Discharge Plan: [place] ?Barriers to Discharge: Continued Medical Work up ? ? ?Patient Goals and CMS Choice ?Patient states their goals for this hospitalization and ongoing recovery are:: thinks patient will need hospice, wants him placed ?CMS Medicare.gov Compare Post Acute Care list provided to:: Patient Represenative (must comment) ?Choice offered to / list presented to : Sibling ? ?Expected Discharge Plan and Services ?Expected Discharge Plan: [place] ?  ?  ?  ?Living arrangements for the past 2 months: [place] ?                ?  ?  ?  ?  ?  ?  ?  ?  ?  ?  ? ?  Prior Living Arrangements/Services ?Living arrangements for the past 2 months: [place] ?Lives with:: Siblings ?Patient language and need for interpreter reviewed:: Yes ?Do you feel safe going back to the place where you live?: No   sister does not feel she can meet his needs at home any longer  ?Need for Family Participation in Patient Care: Yes (Comment) ?Care giver support system in place?: Yes (comment) ?Current home services: DME ?Criminal Activity/Legal Involvement Pertinent to  Current Situation/Hospitalization: No - Comment as needed ? ?Activities of Daily Living ?Home Assistive Devices/Equipment: Gilford Rile (specify type) ?ADL Screening (condition at time of admission) ?Patient's cognitive ability adequate to safely complete daily activities?: Yes ?Is the patient deaf or have difficulty hearing?: Yes ?Does the patient have difficulty seeing, even when wearing glasses/contacts?: No ?Does the patient have difficulty concentrating, remembering, or making decisions?: No ?Patient able to express need for assistance with ADLs?: Yes ?Does the patient have difficulty dressing or bathing?: Yes ?Independently performs ADLs?: No ?Communication: Independent ?Dressing (OT): Needs assistance ?Is this a change from baseline?: Pre-admission baseline ?Grooming: Needs assistance ?Is this a change from baseline?: Pre-admission baseline ?Feeding: Independent ?Bathing: Needs assistance ?Is this a change from baseline?: Pre-admission baseline ?In/Out Bed: Needs assistance ?Is this a change from baseline?: Pre-admission baseline ?Walks in Home: Needs assistance ?Is this a change from baseline?: Pre-admission baseline ?Does the patient have difficulty walking or climbing stairs?: Yes ?Weakness of Legs: Both ?Weakness of Arms/Hands: None ? ?Permission Sought/Granted ?Permission sought to share information with : Customer service manager ?Permission granted to share information with : Yes, Verbal Permission Granted (by sister) ?   ? Permission granted to share info w AGENCY: [facility] ?   ?   ? ?Emotional Assessment ?  ?  ?  ?Orientation: : Fluctuating Orientation (Suspected and/or reported [place]) ?Alcohol / Substance Use: Not Applicable ?Psych Involvement: No (comment) ? ?Admission diagnosis:  Aspiration pneumonia ([place]) [J69.0] ?Patient Active Problem List  ? Diagnosis Date Noted  ? Aspiration pneumonia ([place]) 05/17/2021  ? Goals of care, counseling/discussion 08/18/2019  ? Mass of left side of neck  07/26/2019  ? Stroke [facility]) 06/22/2019  ? Diabetes mellitus without complication ([place]) 31/49/7026  ? Chronic diastolic CHF (congestive heart failure) ([place]) 06/22/2019  ? Pneumonia due to COVID-19 virus 06/22/2019  ? Left arm weakness 06/22/2019  ? Pulmonary nodules 07/12/2016  ? H/O necrotizing fasciitis 05/18/2016  ? Oral pain of unknown etiology 05/18/2016  ? H/O osteomyelitis 05/18/2016  ? Advance care planning 01/07/2016  ? Tachycardia 01/07/2016  ? 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 Hypokalemia 12/21/2015  ? Protein-calorie malnutrition, severe 12/11/2015  ? Electrolyte imbalance 12/11/2015  ? Intractable nausea and vomiting 12/10/2015  ? Dehydration 11/16/2015  ? Odynophagia 11/16/2015  ? Anemia 11/16/2015  ? Thrombocytopenia ([place]) 11/16/2015  ? Hyponatremia 11/12/2015  ? Hypomagnesemia 11/12/2015  ? Cancer associated pain 10/22/2015  ? Carcinoma of base of tongue ([place]) 09/10/2015  ? Low back pain 04/06/2015  ? Hypertension 12/05/2014  ? Hypertensive CKD (chronic kidney disease) 12/05/2014  ? Hyperlipidemia 12/05/2014  ? Class II obesity 12/05/2014  ? Erectile dysfunction 12/05/2014  ? Nonalcoholic steatohepatitis 37/85/8850  ? ?PCP:  Pcp, No ?Pharmacy:   ?[facility] [address] [place] [address] [address] ?[place] [address] ?Phone: [phone] Fax: [phone] ? ? ? ? ?Social Determinants of Health (SDOH) Interventions ?  ? ?Readmission Risk Interventions ?No flowsheet data found. ? ? ?

## 2021-05-19 NOTE — Progress Notes (Signed)
Physical Therapy Treatment ?Patient Details ?Name: Kent [name] ?MRN: [id number] ?DOB: 26-Jul-1955 ?Today's Date: 05/19/2021 ? ? ?History of Present Illness pt is a 66 year old male presenting to ED after an abnormal swallow study and found to have bilateral pneumonia. 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 ?  ?PT Comments  ? ? In bed ready to participate.  To EOB with min guard and rail features.  Stood and walked x 50' in room. He does endorse dizziness today but stated it is his baseline since prior CVA.  He fatigues quickly with gait but does stand after seated rest for standing ex.  Remained in chair after session with safety precautions in place and staff communication.  Feeding tube planned for tomorrow per pt. ?  ?Recommendations for follow up therapy are one component of a multi-disciplinary discharge planning process, led by the attending physician.  Recommendations may be updated based on patient status, additional functional criteria and insurance authorization. ? ?Follow Up Recommendations ? Home health PT ?   ?Assistance Recommended at Discharge PRN  ?Patient can return home with the following A little help with walking and/or transfers;A little help with bathing/dressing/bathroom ?  ?Equipment Recommendations ?    ?  ?Recommendations for Other Services   ? ? ?  ?Precautions / Restrictions Precautions ?Precautions: Fall ?Precaution Comments: left sided residual weakness from previous stroke ?Restrictions ?Weight Bearing Restrictions: No  ?  ? ?Mobility ? Bed Mobility ?Overal bed mobility: Needs Assistance ?Bed Mobility: Supine to Sit ?  ?  ?Supine to sit: Modified independent (Device/Increase time), HOB elevated ?  ?  ?  ?  ? ?Transfers ?Overall transfer level: Needs assistance ?Equipment used: Rolling walker (2 wheels) ?Transfers: Sit  to/from Stand ?Sit to Stand: Min guard ?  ?  ?  ?  ?  ?  ?  ? ?Ambulation/Gait ?Ambulation/Gait assistance: Min assist ?Gait Distance (Feet): 50 Feet ?  ?Gait Pattern/deviations: Step-through pattern, Decreased step length - right, Decreased step length - left ?Gait velocity: decreased ?  ?  ?General Gait Details: generally unsteady with c/o dizziness which he stated is his baseline since prior  CVA. ? ? ?Stairs ?  ?  ?  ?  ?  ? ? ?Wheelchair Mobility ?  ? ?Modified Rankin (Stroke Patients Only) ?  ? ? ?  ?Balance Overall balance assessment: Needs assistance ?Sitting-balance support: No upper extremity supported, Feet supported ?Sitting balance-Leahy Scale: Good ?  ?  ?Standing balance support: Bilateral upper extremity supported ?Standing balance-Leahy Scale: Fair ?Standing balance comment: hands on assist at all times when up ?  ?  ?  ?  ?  ?  ?  ?  ?  ?  ?  ?  ? ?  ?Cognition Arousal/Alertness: Awake/alert ?Behavior During Therapy: [facility] for tasks assessed/performed ?Overall Cognitive Status: Within Functional Limits for tasks assessed ?  ?  ?  ?  ?  ?  ?  ?  ?  ?  ?  ?  ?  ?  ?  ?  ?  ?  ?  ? ?  ?Exercises   ? ?  ?General Comments   ?  ?  ? ?Pertinent Vitals/Pain Pain Assessment ?Pain Assessment: No/denies pain  ? ? ?Home Living   ?  ?  ?  ?  ?  ?  ?  ?  ?  ?   ?  ?  Prior Function    ?  ?  ?   ? ?PT Goals (current goals can now be found in the care plan section) Progress towards PT goals: Progressing toward goals ? ?  ?Frequency ? ? ? Min 2X/week ? ? ? ?  ?PT Plan    ? ? ?Co-evaluation   ?  ?  ?  ?  ? ?  ?AM-PAC PT "6 Clicks" Mobility   ?Outcome Measure ? Help needed turning from your back to your side while in a flat bed without using bedrails?: None ?Help needed moving from lying on your back to sitting on the side of a flat bed without using bedrails?: None ?Help needed moving to and from a bed to a chair (including a wheelchair)?: A Little ?Help needed standing up from a chair using your arms (e.g., wheelchair  or bedside chair)?: A Little ?Help needed to walk in hospital room?: A Little ?Help needed climbing 3-5 steps with a railing? : A Little ?6 Click Score: 20 ? ?  ?End of Session Equipment Utilized During Treatment: Gait belt ?Activity Tolerance: Patient tolerated treatment well ?Patient left: in chair;with call bell/phone within reach;with chair alarm set ?Nurse Communication: Mobility status ?PT Visit Diagnosis: Muscle weakness (generalized) (M62.81);Difficulty in walking, not elsewhere classified (R26.2) ?  ? ? ?Time: 2353-6144 ?PT Time Calculation (min) (ACUTE ONLY): 14 min ? ?Charges:  $Gait Training: 8-22 mins          ?          ? ?{Jenisse Vullo Floreen Comber, PTA ?05/19/21, 11:31 AM ? ?

## 2021-05-19 NOTE — Progress Notes (Signed)
[facility]) ?Hospice hospital liaison note ? ?Received request from [facility] for family interest in hospice home.  ? ?Met in room with patient and spoke with sister Butch Penny by telephone. Explained services and answered questions. At this time patient is not meeting hospice home criteria. Provided education to sister Butch Penny on hospice services in the home vs facility. ? ?TOC is aware hospital liaison will continue to follow for disposition planning.   ? ?Please do not hesitate to call with any hospice related questions or concerns.  ? ?Thank you for the opportunity to participate in this patient's care.  ?Jhonnie Garner, Therapist, sports, BSN ?[facility] Liaison  ?[phone] ?

## 2021-05-20 ENCOUNTER — Inpatient Hospital Stay: Payer: Medicare Other

## 2021-05-20 ENCOUNTER — Inpatient Hospital Stay: Payer: Medicare Other | Admitting: Oncology

## 2021-05-20 DIAGNOSIS — Z515 Encounter for palliative care: Secondary | ICD-10-CM | POA: Diagnosis not present

## 2021-05-20 DIAGNOSIS — J690 Pneumonitis due to inhalation of food and vomit: Secondary | ICD-10-CM | POA: Diagnosis not present

## 2021-05-20 DIAGNOSIS — C01 Malignant neoplasm of base of tongue: Secondary | ICD-10-CM | POA: Diagnosis not present

## 2021-05-20 NOTE — Progress Notes (Signed)
Mobility Specialist - Progress Note ? ? 05/20/21 1100  ?Mobility  ?Activity Dangled on edge of bed;Stood at bedside  ?Level of Assistance Standby assist, set-up cues, supervision of patient - no hands on  ?Assistive Device Front wheel walker  ?Distance Ambulated (ft) 0 ft  ?Activity Response Tolerated well  ?$Mobility charge 1 Mobility  ? ? ?Mobility responded to bed alarm. Pt attempting to get OOB on arrival, assisted STS x1 with supervision. Pt dangled EOB ~ 5 minutes before returning supine. Pt voices L hip pain and requesting pain medication. RN notified. Pt left in bed with alarm set, needs in reach.  ? ? ?Kathee Delton ?Mobility Specialist ?05/20/21, 11:37 AM ? ? ? ? ? ?

## 2021-05-20 NOTE — Progress Notes (Signed)
? ? ? ?Progress Note  ? ? Kent [name]  GYJ:[id number] DOB: [birth year]  DOA: 05/17/2021 ?PCP: Pcp, No  ? ? ? ? ?Brief Narrative:  ? ? ?Medical records reviewed and are as summarized below: ? ?Kent [name] is a 66 y.o. 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  He was sent to the emergency room after a swallow study showed aspiration due to dysphagia. ? ?He was admitted to the hospital for aspiration pneumonia.  He was treated with empiric IV antibiotics. ? ?  ? ? ? ? ?Assessment/Plan:  ? ?Principal Problem: ?  Aspiration pneumonia ([place]) ?Active Problems: ?  Diabetes mellitus without complication ([place]) ?  Anemia ?  Chronic diastolic CHF (congestive heart failure) ([place]) ?  Hypertension ?  Carcinoma of base of tongue ([place]) ?  Palliative care encounter ? ? ? ?Body mass index is 22.34 kg/m?. ? ? ?Aspiration pneumonia ?Severe dysphagia ?Stage IV squamous cell carcinoma of the base of the tongue/tonsil ?Hypertension ?Type 2 diabetes mellitus ?Chronic diastolic CHF ?History of stroke ? ? ?PLAN ? ?Continue comfort care measures. ?Patient has been approved for hospice home.  However, there are no beds available today.  Plan to discharge to hospice home tomorrow depending on bed availability. ? ? ? ? ? ?Diet Order   ? ?       ?  Diet NPO time specified  Diet effective now       ?  ? ?  ?  ? ?  ? ? ? ? ? ? ? ? ? ?Consultants: ?Oncologist ? ?Procedures: ?None ? ? ? ?Medications:  ? ? chlorhexidine  15 mL Mouth Rinse BID  ? fluticasone  1 spray Each Nare Daily  ? mouth rinse  15 mL Mouth Rinse q12n4p  ? sodium chloride flush  3 mL Intravenous Q12H  ? ?Continuous Infusions: ? ? ? ? ?Anti-infectives (From admission, onward)  ? ? Start     Dose/Rate Route Frequency  Ordered Stop  ? 05/18/21 2000  cefTRIAXone (ROCEPHIN) 1 g in sodium chloride 0.9 % 100 mL IVPB  Status:  Discontinued       ? 1 g ?200 mL/hr over 30 Minutes Intravenous Every 24 hours 05/17/21 2208 05/17/21 2213  ? 05/18/21 0800  metroNIDAZOLE (FLAGYL) IVPB 500 mg  Status:  Discontinued       ? 500 mg ?100 mL/hr over 60 Minutes Intravenous Every 12 hours 05/17/21 2208 05/19/21 1318  ? 05/18/21 0700  cefTRIAXone (ROCEPHIN) 2 g in sodium chloride 0.9 % 100 mL IVPB  Status:  Discontinued       ? 2 g ?200 mL/hr over 30 Minutes Intravenous Every 24 hours 05/17/21 2213 05/19/21 1318  ? 05/17/21 2015  cefTRIAXone (ROCEPHIN) 2 g in sodium chloride 0.9 % 100 mL IVPB       ? 2 g ?200 mL/hr over 30 Minutes Intravenous  Once 05/17/21 2004 05/17/21 2200  ? 05/17/21 2015  metroNIDAZOLE (FLAGYL) IVPB 500 mg       ? 500 mg ?100 mL/hr over 60 Minutes Intravenous  Once 05/17/21 2004 05/17/21 2200  ? ?  ? ? ? ? ? ? ? ? ? ?Family Communication/Anticipated D/C date and plan/Code Status  ? ?DVT prophylaxis:  ? ? ?  Code Status: DNR ? ?  Family Communication: None ?Disposition Plan: Plan to discharge to hospice house tomorrow ? ? ?Status is: Inpatient ?Remains inpatient appropriate because: Comfort care awaiting placement to hospice ? ? ? ? ? ? ? ? ?Subjective:  ? ?Interval events noted.  No complaints.  He said he prefers to go to hospice house rather than go home.  He lives with his sister who is disabled and unable to care for him at this time. ? ?Objective:  ? ? ?Vitals:  ? 05/19/21 0347 05/19/21 0822 05/19/21 2005 05/20/21 0801  ?BP: 123/85 119/72 138/88 121/81  ?Pulse: 88 79 99 (!) 108  ?Resp: 20  20 16   ?Temp: 97.9 ?F (36.6 ?C) 97.9 ?F (36.6 ?C) 98.1 ?F (36.7 ?C) 98 ?F (36.7 ?C)  ?TempSrc:   Oral Oral  ?SpO2: 94% 91% 94% 91%  ?Weight:      ?Height:      ? ?No data found. ? ? ?Intake/Output Summary (Last 24 hours) at 05/20/2021 1700 ?Last data filed at 05/20/2021 0607 ?Gross per 24 hour  ?Intake --  ?Output 650 ml  ?Net -650 ml   ? ?Filed Weights  ? 05/17/21 1542 05/17/21 2313  ?Weight: 64.8 kg 64.7 kg  ? ? ?Exam: ? ?GEN: NAD ?SKIN: No rash ?EYES: EOMI ?ENT: MMM, enlarged tongue CV: RRR ?PULM: CTA B ?ABD: soft, ND, NT, +BS ?CNS: AAO x 3, non focal, slurred speech from enlarged tongue ?EXT: No edema or tenderness ? ? ? ?  ? ? ?Data Reviewed:  ? ?I have personally reviewed following labs and imaging studies: ? ?Labs: ?Labs show the following:  ? ?Basic Metabolic Panel: ?Recent Labs  ?Lab 05/17/21 ?1544 05/18/21 ?0457 05/19/21 ?0433  ?NA 135 138 136  ?K 3.7 3.6 3.8  ?CL 97* 102 102  ?CO2 30 32 28  ?GLUCOSE 90 77 93  ?BUN 11 8 10   ?CREATININE 0.69 0.55* 0.55*  ?CALCIUM 8.4* 8.3* 8.5*  ? ?GFR ?Estimated Creatinine Clearance: 83.1 mL/min (A) (by C-G formula based on SCr of 0.55 mg/dL (L)). ?Liver Function Tests: ?Recent Labs  ?Lab 05/18/21 ?2671  ?AST 18  ?ALT 13  ?ALKPHOS 86  ?BILITOT 0.4  ?PROT 6.3*  ?ALBUMIN 2.6*  ? ?No results for input(s): LIPASE, AMYLASE in the last 168 hours. ?No results for input(s): AMMONIA in the last 168 hours. ?Coagulation profile ?No results for input(s): INR, PROTIME in the last 168 hours. ? ?CBC: ?Recent Labs  ?Lab 05/17/21 ?1544 05/18/21 ?0457 05/19/21 ?0433  ?WBC 9.2 8.2 6.0  ?NEUTROABS  --   --  4.3  ?HGB 10.0* 9.9* 10.0*  ?HCT 31.9* 31.1* 31.5*  ?MCV 88.6 87.9 87.3  ?PLT 226 194 197  ? ?Cardiac Enzymes: ?No results for input(s): CKTOTAL, CKMB, CKMBINDEX, TROPONINI in the last 168 hours. ?BNP (last 3 results) ?No results for input(s): PROBNP in the last 8760 hours. ?CBG: ?No results for input(s): GLUCAP in the last 168 hours. ?D-Dimer: ?No results for input(s): DDIMER in the last 72 hours. ?Hgb A1c: ?No results for input(s): HGBA1C in the last 72 hours. ?Lipid Profile: ?No results for input(s): CHOL, HDL, LDLCALC, TRIG, CHOLHDL, LDLDIRECT in the last 72 hours. ?Thyroid function studies: ?No results for input(s): TSH, T4TOTAL, T3FREE, THYROIDAB in the last 72 hours. ? ?Invalid input(s): FREET3 ?Anemia work  up: ?No results for input(s): VITAMINB12, FOLATE, FERRITIN, TIBC, IRON, RETICCTPCT in the last 72 hours. ?Sepsis Labs: ?Recent Labs  ?Lab 05/17/21 ?1544 05/18/21 ?0457 05/19/21 ?0433  ?WBC 9.2 8.2 6.0  ? ? ?Microbiology ?Recent  Results (from the past 240 hour(s))  ?Resp Panel by RT-PCR (Flu A&B, Covid) Nasopharyngeal Swab     Status: None  ? Collection Time: 05/17/21  7:19 PM  ? Specimen: Nasopharyngeal Swab; Nasopharyngeal(NP) swabs in vial transport medium  ?Result Value Ref Range Status  ? SARS Coronavirus 2 by RT PCR NEGATIVE NEGATIVE Final  ?  Comment: (NOTE) ?SARS-CoV-2 target nucleic acids are NOT DETECTED. ? ?The SARS-CoV-2 RNA is generally detectable in upper respiratory ?specimens during the acute phase of infection. The lowest ?concentration of SARS-CoV-2 viral copies this assay can detect is ?138 copies/mL. A negative result does not preclude SARS-Cov-2 ?infection and should not be used as the sole basis for treatment or ?other patient management decisions. A negative result may occur with  ?improper specimen collection/handling, submission of specimen other ?than nasopharyngeal swab, presence of viral mutation(s) within the ?areas targeted by this assay, and inadequate number of viral ?copies(<138 copies/mL). A negative result must be combined with ?clinical observations, patient history, and epidemiological ?information. The expected result is Negative. ? ?Fact Sheet for Patients:  ?EntrepreneurPulse.com.au ? ?Fact Sheet for Healthcare Providers:  ?IncredibleEmployment.be ? ?This test is no t yet approved or cleared by the [place] FDA and  ?has been authorized for detection and/or diagnosis of SARS-CoV-2 by ?FDA under an Emergency Use Authorization (EUA). This EUA will remain  ?in effect (meaning this test can be used) for the duration of the ?COVID-19 declaration under Section 564(b)(1) of the Act, 21 ?U.S.C.section 360bbb-3(b)(1), unless the authorization is  terminated  ?or revoked sooner.  ? ? ?  ? Influenza A by PCR NEGATIVE NEGATIVE Final  ? Influenza B by PCR NEGATIVE NEGATIVE Final  ?  Comment: (NOTE) ?The Xpert Xpress SARS-CoV-2/FLU/RSV plus assay is intended as an

## 2021-05-20 NOTE — Plan of Care (Signed)
Patient is on comfort care and remains NPO due to dysphagia and aspiration. Patient is now a DNR and will be transferring care to the hospice house when a bed is available. Treated for pain to left knee, no other issues noted at this time. He remains on room air with lung sounds clear.  ?Problem: Activity: ?Goal: Ability to tolerate increased activity will improve ?Outcome: Not Progressing ?  ?Problem: Clinical Measurements: ?Goal: Ability to maintain a body temperature in the normal range will improve ?Outcome: Not Progressing ?  ?Problem: Respiratory: ?Goal: Ability to maintain adequate ventilation will improve ?Outcome: Progressing ?Goal: Ability to maintain a clear airway will improve ?Outcome: Progressing ?  ?

## 2021-05-20 NOTE — TOC Progression Note (Addendum)
Transition of Care (TOC) - Progression Note  ? ? ?Patient Details  ?Name: Kent [name] ?MRN: [id number] ?Date of Birth: [birth year] ? ?Transition of Care (TOC) CM/SW Contact  ?Beverly Sessions, RN ?Phone Number: ?05/20/2021, 2:14 PM ? ?Clinical Narrative:    ?Per MD patient strict NPO ?Reached out to [place] with Manufacturing engineer to determine if this changes patient's eligibility for residential hospice  ? ? ?Update: per Lorayne Bender patient has been approved for residential hospice.  No Bed available to day  ? ?Expected Discharge Plan: [place] ?Barriers to Discharge: Continued Medical Work up ? ?Expected Discharge Plan and Services ?Expected Discharge Plan: [place] ?  ?  ?  ?Living arrangements for the past 2 months: [place] ?                ?  ?  ?  ?  ?  ?  ?  ?  ?  ?  ? ? ?Social Determinants of Health (SDOH) Interventions ?  ? ?Readmission Risk Interventions ?No flowsheet data found. ? ?

## 2021-05-20 NOTE — Progress Notes (Signed)
[name]) Hospital Liaison Note ?  ?Patient has been approved for Hospice Home. Unfortunately, Hospice Home is not able to offer a room today. Sister/Donna-- [phone] & [facility] Manager aware hospital liaison will follow up tomorrow or sooner if a room becomes available.  ?  ?Please do not hesitate to call with any hospice related questions.  ?  ?Thank you for the opportunity to participate in this patient's care. ?  ?Daphene Calamity, MSW ?[place] ?[phone] ? ?

## 2021-05-20 NOTE — Progress Notes (Signed)
Nutrition Brief Note ? ?Chart reviewed. ?Pt now transitioning to comfort care. Pt has decided against PEG tube and is requesting transition to residential hospice.  ?No further nutrition interventions planned at this time.  ?Please re-consult as needed.  ? ?Loistine Chance, RD, LDN, CDCES ?Registered Dietitian II ?Certified Diabetes Care and Education Specialist ?Please refer to [facility] for RD and/or RD on-call/weekend/after hours pager   ?

## 2021-05-20 NOTE — Consult Note (Signed)
? ?  ?Palliative Medicine ?[place] at [facility] ?[phone] [phone] ? ? ?Name: Kent [name] ?Date: 05/20/2021 ?MRN: [id number]  ?DOB: Feb 23, 1956 ? ?Patient Care Team: ?Pcp, No as PCP - General ?Margaretha Sheffield, MD (Otolaryngology) ?Sindy Guadeloupe, MD as Consulting Physician (Oncology)  ? ? ?REASON FOR CONSULTATION: ?Kent [name] is a 66 y.o. 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  Previous MRI of the brain on 12/12/2020 revealed extensive metastatic disease with C1 and skull base invasion and left epidural canal infiltration without cord compression.  Patient had severe neck and back pain.  He has completed 3 cycles of CarboTaxol Keytruda chemotherapy.  Patient was referred to SLP for modified barium swallow study due to overt dysphagia.  He was found to have severe aspiration was recommended n.p.o. and sent to ER for further evaluation and was subsequently admitted on 05/17/2021.  Patient ultimately declined PEG.  He is referred to palliative care to discuss goals. ? ?SOCIAL HISTORY:    ? reports that he has never smoked. He has never used smokeless tobacco. He reports that he does not drink alcohol and does not use drugs. ? ?Patient is divorced.  He lives at home with his sister.  Patient has a son and daughter.  Patient retired from [employer] where he worked in the [employer] facility. ? ?ADVANCE DIRECTIVES:  ?On file - sister is HCPOA ? ?CODE STATUS: DNR ? ?PAST MEDICAL HISTORY: ?Past Medical History:  ?Diagnosis Date  ? Arthritis   ? Diabetes mellitus without complication ([place])   ? Generalized weakness 06/22/2019  ? Hyperlipidemia   ? Hypertension   ? Hypertensive urgency 06/22/2019  ? Obesity   ? Osteomyelitis ([place])   ? From pt chart  ? Renal insufficiency   ? CKD noted on chart  ? Staph infection 05/29/2016  ? Nov  2017. Staph infection in LT shoulder requiring debridement and grafts  ? Status post debridement   ? 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 Tongue cancer ([place])   ? 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 ? ?PAST SURGICAL HISTORY:  ?Past Surgical History:  ?Procedure Laterality Date  ? EXCISION OF TONGUE LESION Left 06/19/2016  ? Procedure: EXCISION OF TONGUE LESION;  Surgeon: Margaretha Sheffield, MD;  Location: [place];  Service: ENT;  Laterality: Left;  diabetic - insulin and oral meds - not using either currently  ? gastric tumor removed  2010  ? I & D EXTREMITY Left 01/09/2016  ? Procedure: IRRIGATION AND DEBRIDEMENT EXTREMITY;  Surgeon: Robert Bellow, MD;  Location: [facility];  Service: General;  Laterality: Left;  ? LARYNGOSCOPY N/A 06/19/2016  ? Procedure: LARYNGOSCOPY;  Surgeon: Margaretha Sheffield, MD;  Location: [place];  Service: ENT;  Laterality: N/A;  ? PERIPHERAL VASCULAR CATHETERIZATION Right 12/31/2015  ? Procedure: Lower Extremity Angiography;  Surgeon: Algernon Huxley, MD;  Location: [place] [facility];  Service: Cardiovascular;  Laterality: Right;  ? PORTA CATH INSERTION N/A 03/07/2021  ? Procedure: PORTA CATH INSERTION;  Surgeon: Algernon Huxley, MD;  Location: [place] [facility];  Service: Cardiovascular;  Laterality: N/A;  ? tongue mass biopsy    ? ? ?HEMATOLOGY/ONCOLOGY HISTORY:  ?Oncology History  ?Carcinoma of base of tongue ([place])  ?09/10/2015 Initial Diagnosis  ?  Carcinoma of base of tongue ([place]) ?  ?03/11/2021 -  Chemotherapy  ? Patient is on Treatment Plan :  Carboplatin (5) + Paclitaxel (175) + Pembrolizumab (200) D1 q21d x 4 cycles / Pembrolizumab (200) Maintenance D1 q21d  ?   ? ? ?ALLERGIES:  has No Known Allergies. ? ?MEDICATIONS:  ?Current Facility-Administered Medications  ?Medication Dose Route Frequency Provider Last Rate Last Admin  ? acetaminophen (TYLENOL) tablet 650 mg  650 mg Oral Q6H PRN Para Skeans, MD      ? Or  ?  acetaminophen (TYLENOL) suppository 650 mg  650 mg Rectal Q6H PRN Para Skeans, MD      ? albuterol (PROVENTIL) (2.5 MG/3ML) 0.083% nebulizer solution 2.5 mg  2.5 mg Nebulization Q2H PRN Para Skeans, MD      ? chlorhexidine (PERIDEX) 0.12 % solution 15 mL  15 mL Mouth Rinse BID Jennye Boroughs, MD   15 mL at 05/20/21 0824  ? fluticasone (FLONASE) 50 MCG/ACT nasal spray 1 spray  1 spray Each Nare Daily Para Skeans, MD   1 spray at 05/20/21 1610  ? MEDLINE mouth rinse  15 mL Mouth Rinse q12n4p Jennye Boroughs, MD   15 mL at 05/20/21 1128  ? morphine (PF) 2 MG/ML injection 2 mg  2 mg Intravenous Q2H PRN Para Skeans, MD   2 mg at 05/20/21 1127  ? ondansetron (ZOFRAN) injection 4 mg  4 mg Intravenous Q6H PRN Para Skeans, MD      ? sodium chloride flush (NS) 0.9 % injection 3 mL  3 mL Intravenous Q12H Para Skeans, MD   3 mL at 05/20/21 9604  ? ? ?VITAL SIGNS: ?BP 121/81 (BP Location: Right Arm)   Pulse (!) 108   Temp 98 ?F (36.7 ?C) (Oral)   Resp 16   Ht 5\' 7"  (1.702 m)   Wt 142 lb 10.2 oz (64.7 kg)   SpO2 91%   BMI 22.34 kg/m?  ?Filed Weights  ? 05/17/21 1542 05/17/21 2313  ?Weight: 142 lb 13.7 oz (64.8 kg) 142 lb 10.2 oz (64.7 kg)  ?  ?Estimated body mass index is 22.34 kg/m? as calculated from the following: ?  Height as of this encounter: 5\' 7"  (1.702 m). ?  Weight as of this encounter: 142 lb 10.2 oz (64.7 kg). ? ?LABS: ?CBC: ?   ?Component Value Date/Time  ? WBC 6.0 05/19/2021 0433  ? HGB 10.0 (L) 05/19/2021 0433  ? HGB 15.2 02/14/2013 1204  ? HCT 31.5 (L) 05/19/2021 0433  ? HCT 43.6 02/14/2013 1204  ? PLT 197 05/19/2021 0433  ? PLT 188 02/14/2013 1204  ? MCV 87.3 05/19/2021 0433  ? MCV 85 02/14/2013 1204  ? NEUTROABS 4.3 05/19/2021 0433  ? LYMPHSABS 0.9 05/19/2021 0433  ? MONOABS 0.6 05/19/2021 0433  ? EOSABS 0.0 05/19/2021 0433  ? BASOSABS 0.1 05/19/2021 0433  ? ?Comprehensive Metabolic Panel: ?   ?Component Value Date/Time  ? NA 136 05/19/2021 0433  ? NA 125 (L) 01/07/2016 1322  ? NA 127 (L)  02/14/2013 1204  ? K 3.8 05/19/2021 0433  ? K 3.8 02/14/2013 1204  ? CL 102 05/19/2021 0433  ? CL 93 (L) 02/14/2013 1204  ? CO2 28 05/19/2021 0433  ? CO2 28 02/14/2013 1204  ? BUN 10 05/19/2021 0433  ? BUN 12 01/07/2016 1322  ? BUN 21 (H) 02/14/2013 1204  ? CREATININE 0.55 (L) 05/19/2021 0433  ? CREATININE 1.63 (H) 02/14/2013 1204  ?  GLUCOSE 93 05/19/2021 0433  ? GLUCOSE 420 (H) 02/14/2013 1204  ? CALCIUM 8.5 (L) 05/19/2021 0433  ? CALCIUM 8.8 02/14/2013 1204  ? AST 18 05/18/2021 0457  ? AST 31 02/14/2013 1204  ? ALT 13 05/18/2021 0457  ? ALT 32 02/14/2013 1204  ? ALKPHOS 86 05/18/2021 0457  ? ALKPHOS 121 (H) 02/14/2013 1204  ? BILITOT 0.4 05/18/2021 0457  ? BILITOT 1.0 01/07/2016 1322  ? BILITOT 1.0 02/14/2013 1204  ? PROT 6.3 (L) 05/18/2021 0457  ? PROT 5.2 (L) 01/07/2016 1322  ? PROT 7.1 02/14/2013 1204  ? ALBUMIN 2.6 (L) 05/18/2021 0457  ? ALBUMIN 2.8 (L) 01/07/2016 1322  ? ALBUMIN 3.5 02/14/2013 1204  ? ? ?RADIOGRAPHIC STUDIES: ?DG Chest 2 View ? ?Result Date: 05/17/2021 ?CLINICAL DATA:  Patient aspirated during a swallowing study today. EXAM: CHEST - 2 VIEW COMPARISON:  06/22/2019 FINDINGS: Cardiac silhouette is normal in size and configuration. Normal mediastinal and hilar contours. Lungs demonstrate mild bilateral interstitial prominence, stable and chronic. No lung consolidation. No evidence of aspiration pneumonitis. No pulmonary edema. No pleural effusion or pneumothorax. Right anterior chest wall, internal jugular, Port-A-Cath has its tip in the lower superior vena cava. Skeletal structures are intact. IMPRESSION: No active cardiopulmonary disease. Electronically Signed   By: Lajean Manes M.D.   On: 05/17/2021 16:47  ? ?CT SOFT TISSUE NECK W CONTRAST ? ?Result Date: 05/03/2021 ?CLINICAL DATA:  History of tongue cancer EXAM: CT NECK WITH CONTRAST TECHNIQUE: Multidetector CT imaging of the neck was performed using the standard protocol following the bolus administration of intravenous contrast. RADIATION  DOSE REDUCTION: This exam was performed according to the departmental dose-optimization program which includes automated exposure control, adjustment of the mA and/or kV according to patient size and/or use of iterative reco

## 2021-05-21 DIAGNOSIS — C01 Malignant neoplasm of base of tongue: Secondary | ICD-10-CM | POA: Diagnosis not present

## 2021-05-21 DIAGNOSIS — I5032 Chronic diastolic (congestive) heart failure: Secondary | ICD-10-CM

## 2021-05-21 DIAGNOSIS — J690 Pneumonitis due to inhalation of food and vomit: Secondary | ICD-10-CM | POA: Diagnosis not present

## 2021-05-21 NOTE — TOC Progression Note (Signed)
Transition of Care (TOC) - Progression Note  ? ? ?Patient Details  ?Name: Kent [name] ?MRN: [id number] ?Date of Birth: Oct 24, 1955 ? ?Transition of Care (TOC) CM/SW Contact  ?Beverly Sessions, RN ?Phone Number: ?05/21/2021, 2:04 PM ? ?Clinical Narrative:    ? ? ?Patient has been accepted to residential hospice and in agreement ? ?Update:  Patient has hospice bed available today.  Family has completed consents.  ? ?[place] [facility] has arranged EMS transport for 5 pm ? ?EMS packet and DNR on chart ? ?Bedside RN to call report  ? ?Expected Discharge Plan: [place] ?Barriers to Discharge: Continued Medical Work up ? ?Expected Discharge Plan and Services ?Expected Discharge Plan: [place] ?  ?  ?  ?Living arrangements for the past 2 months: [place] ?Expected Discharge Date: 05/21/21               ?  ?  ?  ?  ?  ?  ?  ?  ?  ?  ? ? ?Social Determinants of Health (SDOH) Interventions ?  ? ?Readmission Risk Interventions ?No flowsheet data found. ? ?

## 2021-05-21 NOTE — Progress Notes (Incomplete)
{  [id number]} 

## 2021-05-21 NOTE — Discharge Summary (Signed)
? ?Physician Discharge Summary  ?Kent [name] MBW:[id number] DOB: May 15, 1955 DOA: 05/17/2021 ? ?PCP: Pcp, No ? ?Admit date: 05/17/2021 ?Discharge date: 05/21/2021 ? ?Discharge disposition: Hospice facility ? ? ?Recommendations for Outpatient Follow-Up:  ? ?Follow-up with the hospice team at the hospice facility ? ? ?Discharge Diagnosis:  ? ?Principal Problem: ?  Aspiration pneumonia ([place]) ?Active Problems: ?  Diabetes mellitus without complication ([place]) ?  Anemia ?  Chronic diastolic CHF (congestive heart failure) ([place]) ?  Hypertension ?  Carcinoma of base of tongue ([place]) ?  Palliative care encounter ? ? ? ?Discharge Condition: Stable. ? ?Diet recommendation:  ?Diet Order   ? ?       ?  DIET - DYS 1 Room service appropriate? Yes; Fluid consistency: Thin  Diet effective now       ?  ? ?  ?  ? ?  ? ? ?  Code Status: DNR ? ? ? ? ?Hospital Course:  ? ?[name] is a 66 y.o. 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  He was sent to the emergency room after a swallow study showed aspiration due to dysphagia. ?  ?He was admitted to the hospital for aspiration pneumonia.  He was treated with empiric IV antibiotics and IV fluids.  Goals of care were discussed with the patient and Butch Penny, his sister.  They opted for comfort measures with hospice.  He was evaluated by the hospice team and is deemed eligible for discharge to hospice facility. ?  ? ? ? ?Medical Consultants:  ? ?Palliative care/hospice ?Oncologist ? ? ?Discharge Exam:  ? ? ?Vitals:  ? 05/20/21 0801 05/20/21 2019 05/21/21 0177 05/21/21 0827  ?BP: 121/81 (!) 128/91 94/66 110/78  ?Pulse: (!) 108 (!) 110 99 (!) 102  ?Resp: 16 16 16 18   ?Temp: 98 ?F (36.7 ?C) 98.7 ?F (37.1 ?C)  98.4 ?F (36.9 ?C) (!) 97.5 ?F (36.4 ?C)  ?TempSrc: Oral Oral Oral   ?SpO2: 91% 100% 91% 93%  ?Weight:      ?Height:      ? ? ? ?GEN: NAD ?SKIN: Warm and dry ?EYES: No pallor or icterus ?ENT: Dry mucous membranes, enlarged tongue ?CV: RRR ?PULM: CTA B ?ABD: soft, ND, NT, +BS ?CNS: AAO x 3, non focal, slurred speech, enlarged tongue ?EXT: No edema or tenderness ? ? ?The results of significant diagnostics from this hospitalization (including imaging, microbiology, ancillary and laboratory) are listed below for reference.   ? ? ?Procedures and Diagnostic Studies:  ? ?DG Chest 2 View ? ?Result Date: 05/17/2021 ?CLINICAL DATA:  Patient aspirated during a swallowing study today. EXAM: CHEST - 2 VIEW COMPARISON:  06/22/2019 FINDINGS: Cardiac silhouette is normal in size and configuration. Normal mediastinal and hilar contours. Lungs demonstrate mild bilateral interstitial prominence, stable and chronic. No lung consolidation. No evidence of aspiration pneumonitis. No pulmonary edema. No pleural effusion or pneumothorax. Right anterior chest wall, internal jugular, Port-A-Cath has its tip in the lower superior vena cava. Skeletal structures are intact. IMPRESSION: No active cardiopulmonary disease. Electronically Signed   By: Lajean Manes M.D.   On: 05/17/2021 16:47  ? ?CT CHEST WO CONTRAST ? ?Result Date: 05/17/2021 ?CLINICAL DATA:  Recent swallowing study today with possible aspiration. EXAM: CT CHEST WITHOUT CONTRAST TECHNIQUE: Multidetector  CT imaging of the chest was performed following the standard protocol without IV contrast. RADIATION DOSE REDUCTION: This exam was performed according to the departmental dose-optimization program which includes automated exposure control, adjustment of the mA and/or kV according to patient size and/or use of iterative reconstruction technique. COMPARISON:  May 03, 2021. FINDINGS: Cardiovascular: A right-sided venous Port-A-Cath is noted. There is mild to moderate severity  calcification of the aortic arch and descending thoracic aorta. Normal heart size with mild coronary artery calcification. No pericardial effusion. Mediastinum/Nodes: No enlarged mediastinal or axillary lymph nodes. Thyroid gland and esophagus demonstrate no significant findings. A mild amount of contrast attenuation is seen along the walls of the proximal left upper lobe and left lower lobe branches of the left mainstem bronchus. Involvement of multiple left lower lobe bronchioles is seen. Lungs/Pleura: Mild, predominant stable areas of airspace disease are seen within the bilateral upper lobes and bilateral lower lobes. Mild to moderate severity left basilar consolidation is noted which is decreased in severity when compared to the prior study. Marked severity bronchiectatic changes are again seen within this region. A stable 4 mm noncalcified lung nodule is seen within the inferior medial aspect of the left upper lobe (axial CT image 122, CT series 3). There is no evidence of a pleural effusion or pneumothorax. Upper Abdomen: A 3.8 cm x 2.7 cm cyst is seen along the posterior aspect of the upper pole of the right kidney. Musculoskeletal: Degenerative changes seen throughout the thoracic spine. IMPRESSION: 1. Mild amount of aspirated contrast along the walls of the proximal left upper lobe and left lower lobe branches of the left mainstem bronchus with involvement of multiple left lower lobe bronchioles. 2. Mild to moderate severity left basilar consolidation which is decreased in severity when compared to the prior study. 3. Mild, predominant stable areas of bilateral airspace disease. 4. Stable 4 mm noncalcified lung nodule within the inferior medial aspect of the left upper lobe. No follow-up needed if patient is low-risk.This recommendation follows the consensus statement: Guidelines for Management of Incidental Pulmonary Nodules Detected on CT Images: From the Fleischner Society 2017; Radiology 2017;  284:228-243. 5. 3.8 cm x 2.7 cm cyst along the posterior aspect of the upper pole of the right kidney. Aortic Atherosclerosis (ICD10-I70.0). Electronically Signed   By: Virgina Norfolk M.D.   On: 05/17/2021 19:55  ? ?DG SWALLOW FUNC OP MEDICARE SPEECH PATH ? ?Result Date: 05/17/2021 ?Table formatting from the original result was not included. Objective Swallowing Evaluation: Type of Study: MBS-Modified Barium Swallow Study  Patient Details Name: HUXLEY SHURLEY MRN: [id number] Date of Birth: Jul 31, 1955 Today's Date: 05/17/2021 Time: SLP Start Time (ACUTE ONLY): 8527 -SLP Stop Time (ACUTE ONLY): 7824 SLP Time Calculation (min) (ACUTE ONLY): 60 min Past Medical History: Past Medical History: Diagnosis Date  Arthritis   Diabetes mellitus without complication ([place])   Hyperlipidemia   Hypertension   Obesity   Osteomyelitis ([place])   From pt chart  Renal insufficiency   CKD noted on chart  Staph infection 05/29/2016  Nov 2017. 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  Tongue cancer ([place])   Stage IV squamous cell carcinoma of the base of the tongue/left tonsil Past Surgical History: Past Surgical History: Procedure Laterality Date  EXCISION OF TONGUE LESION Left 06/19/2016  Procedure: EXCISION OF TONGUE LESION;  Surgeon: Margaretha Sheffield, MD;  Location: [place];  Service: ENT;  Laterality: Left;  diabetic - insulin and oral meds - not using either currently  gastric tumor removed  2010  I & D EXTREMITY Left 01/09/2016  Procedure: IRRIGATION AND DEBRIDEMENT EXTREMITY;  Surgeon: Robert Bellow, MD;  Location: [facility];  Service: General;  Laterality: Left;  LARYNGOSCOPY N/A 06/19/2016  Procedure: LARYNGOSCOPY;  Surgeon: Margaretha Sheffield, MD;  Location: [place];  Service: ENT;  Laterality: N/A;  PERIPHERAL VASCULAR CATHETERIZATION Right 12/31/2015  Procedure: Lower Extremity Angiography;  Surgeon: Algernon Huxley, MD;   Location: [place] [facility];  Service: Cardiovascular;  Laterality: Right;  PORTA CATH INSERTION N/A 03/07/2021  Procedure: PORTA CATH INSERTION;  Surgeon: Algernon Huxley, MD;  Location: [place] [facility];  Service: [employer]

## 2021-05-21 NOTE — Progress Notes (Signed)
Report called to Sharlyne Pacas, RN from hospice.  Per RN's request IV access will remain in .   ?

## 2021-05-21 NOTE — Plan of Care (Signed)
Patient to be transferred to hospice, report called to hospice nurse, IV to remain in place per request of hospice nurse.  EMS to transport, family has been notified. ?

## 2021-05-21 NOTE — Progress Notes (Signed)
Patient requested sometihing to eat. He understands that he's at high risk of choking and this could potentially lead to worsening pneumonia, respiratory failure, cardiac pulmonary arrest and death.  Butch Penny, sister, was updated on the phone. ?

## 2021-05-21 NOTE — Care Management Important Message (Signed)
Important Message ? ?Patient Details  ?Name: Kent [name] ?MRN: [id number] ?Date of Birth: 01-04-1956 ? ? ?Medicare Important Message Given:  Other (see comment) ? ?On comfort care measures.  Medicare IM withheld at this time out of respect for patient and family. ? ? ? ?Dannette Barbara ?05/21/2021, 8:29 AM ?

## 2021-05-21 NOTE — Progress Notes (Addendum)
[facility]) Hospital Liaison Note ?  ?Unfortunately, Hospice Home is not able to offer a room today. Family and Stephanie/TOC Manager aware hospital liaison will follow up tomorrow or sooner if a room becomes available.  ?  ?Addendum: 1:02p ? ?Hospice Home able to offer a room for admission today.  ? ?Consent forms have been completed. ? ?EMS notified of patient D/C and transport arranged for 5pm. TOC/Stephanie and Attending Physician/Dr. Mal Misty also notified of transport arrangement.  ?  ?Please send signed DNR form with patient and RN call report to [phone].  ? ?Thank you for the opportunity to participate in this patient's care. ?  ?Daphene Calamity, MSW ?[place] ?[phone] ? ?

## 2021-06-05 ENCOUNTER — Inpatient Hospital Stay: Payer: Medicare Other

## 2021-07-01 DEATH — deceased
